# Patient Record
Sex: Female | Born: 1955
Health system: Southern US, Community
[De-identification: ages and names within clinical notes are randomized; demographics above are authoritative.]

## PROBLEM LIST (undated history)

## (undated) ENCOUNTER — Emergency Department (HOSPITAL_COMMUNITY): Payer: Medicare Other | Source: Home / Self Care

## (undated) DIAGNOSIS — I10 Essential (primary) hypertension: Secondary | ICD-10-CM

## (undated) DIAGNOSIS — N186 End stage renal disease: Secondary | ICD-10-CM

## (undated) DIAGNOSIS — N2581 Secondary hyperparathyroidism of renal origin: Secondary | ICD-10-CM

## (undated) DIAGNOSIS — T8859XA Other complications of anesthesia, initial encounter: Secondary | ICD-10-CM

## (undated) DIAGNOSIS — J189 Pneumonia, unspecified organism: Secondary | ICD-10-CM

## (undated) DIAGNOSIS — J42 Unspecified chronic bronchitis: Secondary | ICD-10-CM

## (undated) DIAGNOSIS — Z992 Dependence on renal dialysis: Secondary | ICD-10-CM

## (undated) DIAGNOSIS — J302 Other seasonal allergic rhinitis: Secondary | ICD-10-CM

## (undated) DIAGNOSIS — E079 Disorder of thyroid, unspecified: Secondary | ICD-10-CM

## (undated) DIAGNOSIS — M1990 Unspecified osteoarthritis, unspecified site: Secondary | ICD-10-CM

## (undated) DIAGNOSIS — J986 Disorders of diaphragm: Secondary | ICD-10-CM

## (undated) DIAGNOSIS — T4145XA Adverse effect of unspecified anesthetic, initial encounter: Secondary | ICD-10-CM

## (undated) DIAGNOSIS — J45909 Unspecified asthma, uncomplicated: Secondary | ICD-10-CM

## (undated) DIAGNOSIS — K219 Gastro-esophageal reflux disease without esophagitis: Secondary | ICD-10-CM

## (undated) DIAGNOSIS — Z973 Presence of spectacles and contact lenses: Secondary | ICD-10-CM

## (undated) DIAGNOSIS — Z8709 Personal history of other diseases of the respiratory system: Secondary | ICD-10-CM

## (undated) DIAGNOSIS — S82002A Unspecified fracture of left patella, initial encounter for closed fracture: Secondary | ICD-10-CM

## (undated) HISTORY — PX: CATARACT EXTRACTION W/ INTRAOCULAR LENS IMPLANT: SHX1309

## (undated) HISTORY — PX: FRACTURE SURGERY: SHX138

## (undated) HISTORY — PX: TOOTH EXTRACTION: SUR596

## (undated) HISTORY — DX: Essential (primary) hypertension: I10

## (undated) HISTORY — PX: ANKLE FRACTURE SURGERY: SHX122

---
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
## 1990-09-20 HISTORY — PX: DIALYSIS FISTULA CREATION: SHX611

## 1992-09-20 DIAGNOSIS — N186 End stage renal disease: Secondary | ICD-10-CM | POA: Insufficient documentation

## 1998-01-08 ENCOUNTER — Other Ambulatory Visit: Admission: RE | Admit: 1998-01-08 | Discharge: 1998-01-08 | Payer: Self-pay | Admitting: *Deleted

## 1998-01-28 ENCOUNTER — Ambulatory Visit (HOSPITAL_COMMUNITY): Admission: RE | Admit: 1998-01-28 | Discharge: 1998-01-28 | Payer: Self-pay | Admitting: *Deleted

## 1998-02-05 ENCOUNTER — Other Ambulatory Visit: Admission: RE | Admit: 1998-02-05 | Discharge: 1998-02-05 | Payer: Self-pay | Admitting: *Deleted

## 1998-03-07 ENCOUNTER — Other Ambulatory Visit: Admission: RE | Admit: 1998-03-07 | Discharge: 1998-03-07 | Payer: Self-pay | Admitting: *Deleted

## 1998-03-14 ENCOUNTER — Other Ambulatory Visit: Admission: RE | Admit: 1998-03-14 | Discharge: 1998-03-14 | Payer: Self-pay | Admitting: *Deleted

## 1998-03-15 ENCOUNTER — Other Ambulatory Visit: Admission: RE | Admit: 1998-03-15 | Discharge: 1998-03-15 | Payer: Self-pay | Admitting: *Deleted

## 1998-03-17 ENCOUNTER — Other Ambulatory Visit: Admission: RE | Admit: 1998-03-17 | Discharge: 1998-03-17 | Payer: Self-pay | Admitting: *Deleted

## 1998-03-22 ENCOUNTER — Other Ambulatory Visit: Admission: RE | Admit: 1998-03-22 | Discharge: 1998-03-22 | Payer: Self-pay | Admitting: *Deleted

## 1998-03-28 ENCOUNTER — Other Ambulatory Visit: Admission: RE | Admit: 1998-03-28 | Discharge: 1998-03-28 | Payer: Self-pay | Admitting: *Deleted

## 1998-04-05 ENCOUNTER — Other Ambulatory Visit: Admission: RE | Admit: 1998-04-05 | Discharge: 1998-04-05 | Payer: Self-pay | Admitting: *Deleted

## 1998-04-07 ENCOUNTER — Other Ambulatory Visit: Admission: RE | Admit: 1998-04-07 | Discharge: 1998-04-07 | Payer: Self-pay | Admitting: *Deleted

## 1998-04-09 ENCOUNTER — Other Ambulatory Visit: Admission: RE | Admit: 1998-04-09 | Discharge: 1998-04-09 | Payer: Self-pay | Admitting: *Deleted

## 1998-04-11 ENCOUNTER — Other Ambulatory Visit: Admission: RE | Admit: 1998-04-11 | Discharge: 1998-04-11 | Payer: Self-pay | Admitting: *Deleted

## 1998-12-09 ENCOUNTER — Encounter: Payer: Self-pay | Admitting: Vascular Surgery

## 1998-12-09 ENCOUNTER — Ambulatory Visit: Admission: RE | Admit: 1998-12-09 | Discharge: 1998-12-09 | Payer: Self-pay | Admitting: Vascular Surgery

## 1999-01-22 ENCOUNTER — Ambulatory Visit (HOSPITAL_COMMUNITY): Admission: RE | Admit: 1999-01-22 | Discharge: 1999-01-22 | Payer: Self-pay | Admitting: Vascular Surgery

## 1999-06-30 ENCOUNTER — Encounter: Admission: RE | Admit: 1999-06-30 | Discharge: 1999-06-30 | Payer: Self-pay | Admitting: *Deleted

## 1999-07-08 ENCOUNTER — Emergency Department (HOSPITAL_COMMUNITY): Admission: EM | Admit: 1999-07-08 | Discharge: 1999-07-08 | Payer: Self-pay | Admitting: Emergency Medicine

## 1999-08-25 ENCOUNTER — Encounter: Payer: Self-pay | Admitting: Nephrology

## 1999-08-25 ENCOUNTER — Encounter: Admission: RE | Admit: 1999-08-25 | Discharge: 1999-08-25 | Payer: Self-pay | Admitting: Nephrology

## 1999-11-30 ENCOUNTER — Ambulatory Visit (HOSPITAL_COMMUNITY): Admission: RE | Admit: 1999-11-30 | Discharge: 1999-11-30 | Payer: Self-pay | Admitting: Vascular Surgery

## 1999-12-24 ENCOUNTER — Ambulatory Visit (HOSPITAL_COMMUNITY): Admission: RE | Admit: 1999-12-24 | Discharge: 1999-12-24 | Payer: Self-pay | Admitting: Nephrology

## 2000-01-26 ENCOUNTER — Emergency Department (HOSPITAL_COMMUNITY): Admission: EM | Admit: 2000-01-26 | Discharge: 2000-01-26 | Payer: Self-pay | Admitting: Emergency Medicine

## 2001-04-10 ENCOUNTER — Emergency Department (HOSPITAL_COMMUNITY): Admission: EM | Admit: 2001-04-10 | Discharge: 2001-04-10 | Payer: Self-pay | Admitting: *Deleted

## 2001-04-25 ENCOUNTER — Ambulatory Visit (HOSPITAL_COMMUNITY): Admission: RE | Admit: 2001-04-25 | Discharge: 2001-04-25 | Payer: Self-pay | Admitting: Surgery

## 2001-04-25 ENCOUNTER — Encounter: Payer: Self-pay | Admitting: Surgery

## 2001-04-27 ENCOUNTER — Other Ambulatory Visit: Admission: RE | Admit: 2001-04-27 | Discharge: 2001-04-27 | Payer: Self-pay | Admitting: Family Medicine

## 2001-06-28 DIAGNOSIS — D509 Iron deficiency anemia, unspecified: Secondary | ICD-10-CM | POA: Insufficient documentation

## 2001-11-20 ENCOUNTER — Encounter: Admission: RE | Admit: 2001-11-20 | Discharge: 2001-11-20 | Payer: Self-pay | Admitting: Nephrology

## 2001-11-20 ENCOUNTER — Encounter: Payer: Self-pay | Admitting: Nephrology

## 2002-04-02 ENCOUNTER — Encounter: Payer: Self-pay | Admitting: Vascular Surgery

## 2002-04-02 ENCOUNTER — Ambulatory Visit (HOSPITAL_COMMUNITY): Admission: RE | Admit: 2002-04-02 | Discharge: 2002-04-02 | Payer: Self-pay | Admitting: Nephrology

## 2002-04-03 ENCOUNTER — Ambulatory Visit (HOSPITAL_COMMUNITY): Admission: RE | Admit: 2002-04-03 | Discharge: 2002-04-03 | Payer: Self-pay | Admitting: Nephrology

## 2002-04-10 ENCOUNTER — Inpatient Hospital Stay (HOSPITAL_COMMUNITY): Admission: RE | Admit: 2002-04-10 | Discharge: 2002-04-10 | Payer: Self-pay | Admitting: *Deleted

## 2002-04-10 ENCOUNTER — Encounter: Payer: Self-pay | Admitting: *Deleted

## 2002-05-03 ENCOUNTER — Ambulatory Visit (HOSPITAL_COMMUNITY): Admission: RE | Admit: 2002-05-03 | Discharge: 2002-05-03 | Payer: Self-pay | Admitting: Nephrology

## 2002-09-10 ENCOUNTER — Encounter: Payer: Self-pay | Admitting: Emergency Medicine

## 2002-09-10 ENCOUNTER — Encounter: Payer: Self-pay | Admitting: Nephrology

## 2002-09-10 ENCOUNTER — Inpatient Hospital Stay (HOSPITAL_COMMUNITY): Admission: EM | Admit: 2002-09-10 | Discharge: 2002-09-11 | Payer: Self-pay | Admitting: Emergency Medicine

## 2002-10-19 ENCOUNTER — Encounter: Admission: RE | Admit: 2002-10-19 | Discharge: 2002-10-19 | Payer: Self-pay | Admitting: Nephrology

## 2002-10-19 ENCOUNTER — Encounter: Payer: Self-pay | Admitting: Nephrology

## 2002-10-20 ENCOUNTER — Encounter: Payer: Self-pay | Admitting: Emergency Medicine

## 2002-10-20 ENCOUNTER — Emergency Department (HOSPITAL_COMMUNITY): Admission: EM | Admit: 2002-10-20 | Discharge: 2002-10-20 | Payer: Self-pay | Admitting: Emergency Medicine

## 2002-12-03 ENCOUNTER — Encounter: Admission: RE | Admit: 2002-12-03 | Discharge: 2002-12-03 | Payer: Self-pay | Admitting: Nephrology

## 2002-12-03 ENCOUNTER — Encounter: Payer: Self-pay | Admitting: Nephrology

## 2003-03-29 ENCOUNTER — Ambulatory Visit (HOSPITAL_COMMUNITY): Admission: RE | Admit: 2003-03-29 | Discharge: 2003-03-29 | Payer: Self-pay | Admitting: Vascular Surgery

## 2003-06-21 DIAGNOSIS — N2581 Secondary hyperparathyroidism of renal origin: Secondary | ICD-10-CM | POA: Insufficient documentation

## 2003-09-28 ENCOUNTER — Emergency Department (HOSPITAL_COMMUNITY): Admission: EM | Admit: 2003-09-28 | Discharge: 2003-09-28 | Payer: Self-pay | Admitting: Emergency Medicine

## 2003-10-11 ENCOUNTER — Ambulatory Visit (HOSPITAL_COMMUNITY): Admission: RE | Admit: 2003-10-11 | Discharge: 2003-10-11 | Payer: Self-pay | Admitting: Family Medicine

## 2003-10-15 ENCOUNTER — Encounter: Admission: RE | Admit: 2003-10-15 | Discharge: 2003-10-15 | Payer: Self-pay | Admitting: *Deleted

## 2003-10-17 ENCOUNTER — Encounter: Admission: RE | Admit: 2003-10-17 | Discharge: 2003-10-17 | Payer: Self-pay | Admitting: Obstetrics and Gynecology

## 2003-12-06 ENCOUNTER — Ambulatory Visit (HOSPITAL_COMMUNITY): Admission: RE | Admit: 2003-12-06 | Discharge: 2003-12-06 | Payer: Self-pay | Admitting: Vascular Surgery

## 2004-02-26 ENCOUNTER — Ambulatory Visit (HOSPITAL_COMMUNITY): Admission: RE | Admit: 2004-02-26 | Discharge: 2004-02-27 | Payer: Self-pay | Admitting: Nephrology

## 2004-02-27 ENCOUNTER — Ambulatory Visit (HOSPITAL_COMMUNITY): Admission: RE | Admit: 2004-02-27 | Discharge: 2004-02-27 | Payer: Self-pay | Admitting: *Deleted

## 2004-03-06 ENCOUNTER — Emergency Department (HOSPITAL_COMMUNITY): Admission: EM | Admit: 2004-03-06 | Discharge: 2004-03-06 | Payer: Self-pay | Admitting: Emergency Medicine

## 2004-04-02 ENCOUNTER — Ambulatory Visit (HOSPITAL_COMMUNITY): Admission: RE | Admit: 2004-04-02 | Discharge: 2004-04-02 | Payer: Self-pay | Admitting: Vascular Surgery

## 2004-04-16 ENCOUNTER — Encounter (HOSPITAL_COMMUNITY): Admission: RE | Admit: 2004-04-16 | Discharge: 2004-04-27 | Payer: Self-pay | Admitting: Vascular Surgery

## 2004-07-20 ENCOUNTER — Inpatient Hospital Stay (HOSPITAL_COMMUNITY): Admission: AD | Admit: 2004-07-20 | Discharge: 2004-07-20 | Payer: Self-pay | Admitting: Obstetrics & Gynecology

## 2004-08-03 ENCOUNTER — Ambulatory Visit (HOSPITAL_COMMUNITY): Admission: RE | Admit: 2004-08-03 | Discharge: 2004-08-03 | Payer: Self-pay | Admitting: Obstetrics

## 2004-10-12 ENCOUNTER — Ambulatory Visit (HOSPITAL_COMMUNITY): Admission: RE | Admit: 2004-10-12 | Discharge: 2004-10-12 | Payer: Self-pay | Admitting: *Deleted

## 2004-11-23 ENCOUNTER — Ambulatory Visit (HOSPITAL_COMMUNITY): Admission: RE | Admit: 2004-11-23 | Discharge: 2004-11-23 | Payer: Self-pay | Admitting: Obstetrics

## 2004-11-27 ENCOUNTER — Emergency Department (HOSPITAL_COMMUNITY): Admission: EM | Admit: 2004-11-27 | Discharge: 2004-11-27 | Payer: Self-pay | Admitting: Emergency Medicine

## 2004-11-30 ENCOUNTER — Ambulatory Visit (HOSPITAL_COMMUNITY): Admission: RE | Admit: 2004-11-30 | Discharge: 2004-11-30 | Payer: Self-pay | Admitting: Vascular Surgery

## 2005-01-29 ENCOUNTER — Ambulatory Visit (HOSPITAL_COMMUNITY): Admission: RE | Admit: 2005-01-29 | Discharge: 2005-01-29 | Payer: Self-pay | Admitting: Nephrology

## 2005-03-01 ENCOUNTER — Ambulatory Visit (HOSPITAL_COMMUNITY): Admission: RE | Admit: 2005-03-01 | Discharge: 2005-03-01 | Payer: Self-pay | Admitting: Vascular Surgery

## 2005-03-05 ENCOUNTER — Encounter (INDEPENDENT_AMBULATORY_CARE_PROVIDER_SITE_OTHER): Payer: Self-pay | Admitting: *Deleted

## 2005-03-05 ENCOUNTER — Inpatient Hospital Stay (HOSPITAL_COMMUNITY): Admission: AD | Admit: 2005-03-05 | Discharge: 2005-03-07 | Payer: Self-pay | Admitting: Obstetrics and Gynecology

## 2005-03-05 HISTORY — PX: TOTAL ABDOMINAL HYSTERECTOMY: SHX209

## 2005-03-25 ENCOUNTER — Ambulatory Visit (HOSPITAL_COMMUNITY): Admission: RE | Admit: 2005-03-25 | Discharge: 2005-03-25 | Payer: Self-pay | Admitting: Vascular Surgery

## 2005-03-25 HISTORY — PX: ARTERIOVENOUS GRAFT PLACEMENT: SUR1029

## 2005-05-04 ENCOUNTER — Ambulatory Visit (HOSPITAL_COMMUNITY): Admission: RE | Admit: 2005-05-04 | Discharge: 2005-05-04 | Payer: Self-pay | Admitting: Vascular Surgery

## 2005-12-06 ENCOUNTER — Ambulatory Visit: Payer: Self-pay | Admitting: Gastroenterology

## 2006-01-04 ENCOUNTER — Ambulatory Visit: Payer: Self-pay | Admitting: Gastroenterology

## 2006-01-11 ENCOUNTER — Ambulatory Visit: Payer: Self-pay | Admitting: Gastroenterology

## 2006-01-24 ENCOUNTER — Ambulatory Visit (HOSPITAL_COMMUNITY): Admission: RE | Admit: 2006-01-24 | Discharge: 2006-01-24 | Payer: Self-pay | Admitting: Nephrology

## 2006-02-17 ENCOUNTER — Ambulatory Visit: Payer: Self-pay | Admitting: Cardiology

## 2006-02-17 ENCOUNTER — Emergency Department (HOSPITAL_COMMUNITY): Admission: EM | Admit: 2006-02-17 | Discharge: 2006-02-17 | Payer: Self-pay | Admitting: Emergency Medicine

## 2006-02-23 ENCOUNTER — Ambulatory Visit: Payer: Self-pay

## 2006-03-11 ENCOUNTER — Encounter: Payer: Self-pay | Admitting: Cardiology

## 2006-03-11 ENCOUNTER — Ambulatory Visit: Payer: Self-pay

## 2006-04-01 ENCOUNTER — Ambulatory Visit (HOSPITAL_COMMUNITY): Admission: RE | Admit: 2006-04-01 | Discharge: 2006-04-01 | Payer: Self-pay | Admitting: Vascular Surgery

## 2006-06-05 ENCOUNTER — Emergency Department (HOSPITAL_COMMUNITY): Admission: EM | Admit: 2006-06-05 | Discharge: 2006-06-05 | Payer: Self-pay | Admitting: Emergency Medicine

## 2006-08-26 ENCOUNTER — Ambulatory Visit (HOSPITAL_COMMUNITY): Admission: RE | Admit: 2006-08-26 | Discharge: 2006-08-26 | Payer: Self-pay | Admitting: Nephrology

## 2006-09-12 ENCOUNTER — Emergency Department (HOSPITAL_COMMUNITY): Admission: EM | Admit: 2006-09-12 | Discharge: 2006-09-12 | Payer: Self-pay | Admitting: Emergency Medicine

## 2006-09-13 ENCOUNTER — Emergency Department (HOSPITAL_COMMUNITY): Admission: EM | Admit: 2006-09-13 | Discharge: 2006-09-13 | Payer: Self-pay | Admitting: Emergency Medicine

## 2006-12-04 ENCOUNTER — Emergency Department (HOSPITAL_COMMUNITY): Admission: EM | Admit: 2006-12-04 | Discharge: 2006-12-04 | Payer: Self-pay | Admitting: Emergency Medicine

## 2007-02-08 ENCOUNTER — Emergency Department (HOSPITAL_COMMUNITY): Admission: EM | Admit: 2007-02-08 | Discharge: 2007-02-09 | Payer: Self-pay | Admitting: Emergency Medicine

## 2007-10-24 ENCOUNTER — Emergency Department (HOSPITAL_COMMUNITY): Admission: EM | Admit: 2007-10-24 | Discharge: 2007-10-24 | Payer: Self-pay | Admitting: Emergency Medicine

## 2007-10-26 ENCOUNTER — Emergency Department (HOSPITAL_COMMUNITY): Admission: EM | Admit: 2007-10-26 | Discharge: 2007-10-26 | Payer: Self-pay | Admitting: Emergency Medicine

## 2007-12-14 ENCOUNTER — Ambulatory Visit (HOSPITAL_COMMUNITY): Admission: RE | Admit: 2007-12-14 | Discharge: 2007-12-14 | Payer: Self-pay | Admitting: Nephrology

## 2008-06-12 ENCOUNTER — Ambulatory Visit (HOSPITAL_COMMUNITY): Admission: RE | Admit: 2008-06-12 | Discharge: 2008-06-12 | Payer: Self-pay | Admitting: Nephrology

## 2008-07-31 ENCOUNTER — Inpatient Hospital Stay (HOSPITAL_COMMUNITY): Admission: EM | Admit: 2008-07-31 | Discharge: 2008-08-06 | Payer: Self-pay | Admitting: Emergency Medicine

## 2008-08-08 ENCOUNTER — Ambulatory Visit (HOSPITAL_COMMUNITY): Admission: RE | Admit: 2008-08-08 | Discharge: 2008-08-08 | Payer: Self-pay | Admitting: Vascular Surgery

## 2008-08-08 ENCOUNTER — Ambulatory Visit: Payer: Self-pay | Admitting: Vascular Surgery

## 2008-09-08 ENCOUNTER — Emergency Department (HOSPITAL_COMMUNITY): Admission: EM | Admit: 2008-09-08 | Discharge: 2008-09-08 | Payer: Self-pay | Admitting: Emergency Medicine

## 2009-01-09 ENCOUNTER — Ambulatory Visit (HOSPITAL_COMMUNITY): Admission: RE | Admit: 2009-01-09 | Discharge: 2009-01-09 | Payer: Self-pay | Admitting: Nephrology

## 2009-08-14 ENCOUNTER — Emergency Department (HOSPITAL_COMMUNITY): Admission: EM | Admit: 2009-08-14 | Discharge: 2009-08-15 | Payer: Self-pay | Admitting: Emergency Medicine

## 2009-11-12 ENCOUNTER — Encounter: Admission: RE | Admit: 2009-11-12 | Discharge: 2009-11-12 | Payer: Self-pay | Admitting: Nephrology

## 2010-02-19 ENCOUNTER — Ambulatory Visit: Payer: Self-pay | Admitting: Vascular Surgery

## 2010-02-24 ENCOUNTER — Ambulatory Visit (HOSPITAL_COMMUNITY): Admission: RE | Admit: 2010-02-24 | Discharge: 2010-02-24 | Payer: Self-pay | Admitting: Vascular Surgery

## 2010-02-24 ENCOUNTER — Ambulatory Visit: Payer: Self-pay | Admitting: Vascular Surgery

## 2010-03-11 ENCOUNTER — Ambulatory Visit: Payer: Self-pay | Admitting: Vascular Surgery

## 2010-05-18 ENCOUNTER — Emergency Department (HOSPITAL_COMMUNITY): Admission: EM | Admit: 2010-05-18 | Discharge: 2010-05-18 | Payer: Self-pay | Admitting: Emergency Medicine

## 2010-09-18 ENCOUNTER — Emergency Department (HOSPITAL_COMMUNITY)
Admission: EM | Admit: 2010-09-18 | Discharge: 2010-09-18 | Payer: Self-pay | Source: Home / Self Care | Admitting: Family Medicine

## 2010-09-23 ENCOUNTER — Ambulatory Visit
Admission: RE | Admit: 2010-09-23 | Discharge: 2010-09-23 | Payer: Self-pay | Source: Home / Self Care | Attending: Vascular Surgery | Admitting: Vascular Surgery

## 2010-09-24 ENCOUNTER — Ambulatory Visit (HOSPITAL_COMMUNITY)
Admission: RE | Admit: 2010-09-24 | Discharge: 2010-09-24 | Payer: Self-pay | Source: Home / Self Care | Attending: Vascular Surgery | Admitting: Vascular Surgery

## 2010-09-24 LAB — POCT I-STAT 4, (NA,K, GLUC, HGB,HCT)
Glucose, Bld: 92 mg/dL (ref 70–99)
HCT: 44 % (ref 36.0–46.0)
Hemoglobin: 15 g/dL (ref 12.0–15.0)
Potassium: 4 mEq/L (ref 3.5–5.1)
Sodium: 142 mEq/L (ref 135–145)

## 2010-10-10 ENCOUNTER — Encounter: Payer: Self-pay | Admitting: Obstetrics

## 2010-10-11 ENCOUNTER — Encounter: Payer: Self-pay | Admitting: Nephrology

## 2010-11-21 ENCOUNTER — Emergency Department (HOSPITAL_COMMUNITY): Payer: Medicare Other

## 2010-11-21 ENCOUNTER — Emergency Department (HOSPITAL_COMMUNITY)
Admission: EM | Admit: 2010-11-21 | Discharge: 2010-11-21 | Disposition: A | Discharge status: Home / Self Care | Payer: Medicare Other | Attending: Emergency Medicine | Admitting: Emergency Medicine

## 2010-11-21 DIAGNOSIS — R059 Cough, unspecified: Secondary | ICD-10-CM | POA: Insufficient documentation

## 2010-11-21 DIAGNOSIS — R05 Cough: Secondary | ICD-10-CM | POA: Insufficient documentation

## 2010-11-21 DIAGNOSIS — J40 Bronchitis, not specified as acute or chronic: Secondary | ICD-10-CM | POA: Insufficient documentation

## 2010-11-21 DIAGNOSIS — I10 Essential (primary) hypertension: Secondary | ICD-10-CM | POA: Insufficient documentation

## 2010-11-21 DIAGNOSIS — N289 Disorder of kidney and ureter, unspecified: Secondary | ICD-10-CM | POA: Insufficient documentation

## 2010-11-21 DIAGNOSIS — R112 Nausea with vomiting, unspecified: Secondary | ICD-10-CM | POA: Insufficient documentation

## 2010-11-21 DIAGNOSIS — R071 Chest pain on breathing: Secondary | ICD-10-CM | POA: Insufficient documentation

## 2010-11-21 LAB — COMPREHENSIVE METABOLIC PANEL
ALT: 13 U/L (ref 0–35)
AST: 20 U/L (ref 0–37)
Albumin: 3.4 g/dL — ABNORMAL LOW (ref 3.5–5.2)
CO2: 29 mEq/L (ref 19–32)
Calcium: 9.7 mg/dL (ref 8.4–10.5)
GFR calc Af Amer: 5 mL/min — ABNORMAL LOW (ref >60)
GFR calc non Af Amer: 4 mL/min — ABNORMAL LOW (ref >60)
Sodium: 142 mEq/L (ref 135–145)

## 2010-11-21 LAB — DIFFERENTIAL
Basophils Absolute: 0.1 10*3/uL (ref 0.0–0.1)
Basophils Relative: 1 % (ref 0–1)
Eosinophils Absolute: 0.7 10*3/uL (ref 0.0–0.7)
Monocytes Absolute: 0.8 10*3/uL (ref 0.1–1.0)
Neutro Abs: 5.6 10*3/uL (ref 1.7–7.7)
Neutrophils Relative %: 56 % (ref 43–77)

## 2010-11-21 LAB — CBC
Hemoglobin: 13 g/dL (ref 12.0–15.0)
MCHC: 32.6 g/dL (ref 30.0–36.0)
Platelets: 261 10*3/uL (ref 150–400)
RBC: 4.9 MIL/uL (ref 3.87–5.11)

## 2010-11-21 LAB — POCT CARDIAC MARKERS
CKMB, poc: <1 ng/mL — ABNORMAL LOW (ref 1.0–8.0)
Myoglobin, poc: >500 ng/mL (ref 12–200)

## 2010-12-04 LAB — POCT I-STAT, CHEM 8
BUN: 33 mg/dL — ABNORMAL HIGH (ref 6–23)
Calcium, Ion: 0.99 mmol/L — ABNORMAL LOW (ref 1.12–1.32)
Chloride: 101 meq/L (ref 96–112)
Creatinine, Ser: 9.7 mg/dL — ABNORMAL HIGH (ref 0.4–1.2)
Glucose, Bld: 106 mg/dL — ABNORMAL HIGH (ref 70–99)
HCT: 43 % (ref 36.0–46.0)
Hemoglobin: 14.6 g/dL (ref 12.0–15.0)
Potassium: 4.6 meq/L (ref 3.5–5.1)
Sodium: 139 meq/L (ref 135–145)
TCO2: 31 mmol/L (ref 0–100)

## 2010-12-04 LAB — CBC
HCT: 40.2 % (ref 36.0–46.0)
Hemoglobin: 13.3 g/dL (ref 12.0–15.0)
MCH: 26.5 pg (ref 26.0–34.0)
MCHC: 33.1 g/dL (ref 30.0–36.0)
MCV: 80.2 fL (ref 78.0–100.0)
RBC: 5.01 MIL/uL (ref 3.87–5.11)

## 2010-12-04 LAB — DIFFERENTIAL
Basophils Relative: 1 % (ref 0–1)
Eosinophils Absolute: 0.3 10*3/uL (ref 0.0–0.7)
Eosinophils Relative: 3 % (ref 0–5)
Lymphs Abs: 2 10*3/uL (ref 0.7–4.0)
Monocytes Absolute: 0.7 10*3/uL (ref 0.1–1.0)
Monocytes Relative: 7 % (ref 3–12)
Neutrophils Relative %: 69 % (ref 43–77)

## 2010-12-04 LAB — POCT CARDIAC MARKERS
CKMB, poc: 1.2 ng/mL (ref 1.0–8.0)
Myoglobin, poc: >500 ng/mL (ref 12–200)
Troponin i, poc: <0.05 ng/mL (ref 0.00–0.09)

## 2010-12-07 LAB — POCT I-STAT 4, (NA,K, GLUC, HGB,HCT)
Glucose, Bld: 94 mg/dL (ref 70–99)
HCT: 42 % (ref 36.0–46.0)
Potassium: 3.8 mEq/L (ref 3.5–5.1)

## 2011-01-11 ENCOUNTER — Other Ambulatory Visit: Payer: Self-pay | Admitting: Surgery

## 2011-01-11 DIAGNOSIS — E041 Nontoxic single thyroid nodule: Secondary | ICD-10-CM

## 2011-01-13 ENCOUNTER — Ambulatory Visit
Admission: RE | Admit: 2011-01-13 | Discharge: 2011-01-13 | Disposition: A | Discharge status: Home / Self Care | Payer: Medicare Other | Source: Ambulatory Visit | Attending: Surgery | Admitting: Surgery

## 2011-01-13 DIAGNOSIS — E041 Nontoxic single thyroid nodule: Secondary | ICD-10-CM

## 2011-01-26 ENCOUNTER — Inpatient Hospital Stay (HOSPITAL_COMMUNITY)
Admission: RE | Admit: 2011-01-26 | Discharge: 2011-01-27 | DRG: 264 | Disposition: A | Discharge status: Home / Self Care | Payer: Medicare Other | Source: Ambulatory Visit | Attending: Surgery | Admitting: Surgery

## 2011-01-26 DIAGNOSIS — R0989 Other specified symptoms and signs involving the circulatory and respiratory systems: Secondary | ICD-10-CM | POA: Diagnosis present

## 2011-01-26 DIAGNOSIS — N186 End stage renal disease: Secondary | ICD-10-CM

## 2011-01-26 DIAGNOSIS — Z5309 Procedure and treatment not carried out because of other contraindication: Secondary | ICD-10-CM

## 2011-01-26 DIAGNOSIS — I120 Hypertensive chronic kidney disease with stage 5 chronic kidney disease or end stage renal disease: Secondary | ICD-10-CM

## 2011-01-26 DIAGNOSIS — T82898A Other specified complication of vascular prosthetic devices, implants and grafts, initial encounter: Principal | ICD-10-CM | POA: Diagnosis present

## 2011-01-26 DIAGNOSIS — Y832 Surgical operation with anastomosis, bypass or graft as the cause of abnormal reaction of the patient, or of later complication, without mention of misadventure at the time of the procedure: Secondary | ICD-10-CM | POA: Diagnosis present

## 2011-01-26 DIAGNOSIS — N2581 Secondary hyperparathyroidism of renal origin: Secondary | ICD-10-CM | POA: Diagnosis present

## 2011-01-26 DIAGNOSIS — R0609 Other forms of dyspnea: Secondary | ICD-10-CM | POA: Diagnosis present

## 2011-01-26 DIAGNOSIS — Z992 Dependence on renal dialysis: Secondary | ICD-10-CM

## 2011-01-26 DIAGNOSIS — Y9210 Unspecified residential institution as the place of occurrence of the external cause: Secondary | ICD-10-CM | POA: Diagnosis present

## 2011-01-26 DIAGNOSIS — Y92009 Unspecified place in unspecified non-institutional (private) residence as the place of occurrence of the external cause: Secondary | ICD-10-CM

## 2011-01-26 DIAGNOSIS — T361X5A Adverse effect of cephalosporins and other beta-lactam antibiotics, initial encounter: Secondary | ICD-10-CM | POA: Diagnosis present

## 2011-01-26 LAB — BASIC METABOLIC PANEL
BUN: 24 mg/dL — ABNORMAL HIGH (ref 6–23)
Calcium: 9.1 mg/dL (ref 8.4–10.5)
GFR calc non Af Amer: 4 mL/min — ABNORMAL LOW (ref >60)
Glucose, Bld: 79 mg/dL (ref 70–99)
Potassium: 4.1 mEq/L (ref 3.5–5.1)

## 2011-01-26 LAB — DIFFERENTIAL
Basophils Absolute: 0.1 10*3/uL (ref 0.0–0.1)
Basophils Relative: 2 % — ABNORMAL HIGH (ref 0–1)
Eosinophils Absolute: 0.5 10*3/uL (ref 0.0–0.7)
Monocytes Absolute: 0.4 10*3/uL (ref 0.1–1.0)
Monocytes Relative: 4 % (ref 3–12)
Neutro Abs: 4.9 10*3/uL (ref 1.7–7.7)
Neutrophils Relative %: 59 % (ref 43–77)

## 2011-01-26 LAB — CBC
Hemoglobin: 13.8 g/dL (ref 12.0–15.0)
MCH: 26.8 pg (ref 26.0–34.0)
MCHC: 33.1 g/dL (ref 30.0–36.0)
Platelets: 222 10*3/uL (ref 150–400)
RBC: 5.14 MIL/uL — ABNORMAL HIGH (ref 3.87–5.11)

## 2011-01-26 LAB — PROTIME-INR
INR: 0.93 (ref 0.00–1.49)
Prothrombin Time: 12.7 seconds (ref 11.6–15.2)

## 2011-01-26 LAB — SURGICAL PCR SCREEN: Staphylococcus aureus: NEGATIVE

## 2011-01-27 ENCOUNTER — Inpatient Hospital Stay (HOSPITAL_COMMUNITY): Payer: Medicare Other

## 2011-01-27 ENCOUNTER — Other Ambulatory Visit: Payer: Self-pay | Admitting: Surgery

## 2011-01-27 DIAGNOSIS — E041 Nontoxic single thyroid nodule: Secondary | ICD-10-CM

## 2011-01-27 LAB — BASIC METABOLIC PANEL
BUN: 40 mg/dL — ABNORMAL HIGH (ref 6–23)
CO2: 26 mEq/L (ref 19–32)
Calcium: 9.4 mg/dL (ref 8.4–10.5)
Creatinine, Ser: 12.47 mg/dL — ABNORMAL HIGH (ref 0.4–1.2)
GFR calc non Af Amer: 3 mL/min — ABNORMAL LOW (ref >60)
Glucose, Bld: 115 mg/dL — ABNORMAL HIGH (ref 70–99)

## 2011-01-27 NOTE — Op Note (Signed)
Victoria [name], NIGRO                ACCOUNT NO.:  [id number]  MEDICAL RECORD NO.:  [id number]           PATIENT TYPE:  I  LOCATION:  6713                         FACILITY:  [facility]  PHYSICIAN:  Judeth Cornfield. Scot Dock, M.D.DATE OF BIRTH:  May 11, 1956  DATE OF PROCEDURE:  01/26/2011 DATE OF DISCHARGE:                              OPERATIVE REPORT   PREOPERATIVE DIAGNOSIS:  Clotted right forearm arteriovenous graft.  POSTOPERATIVE DIAGNOSIS:  Clotted right forearm arteriovenous graft.  PROCEDURES:  Thrombectomy and revision of right forearm arteriovenous graft.  SURGEON:  Judeth Cornfield. Scot Dock, MD  ASSISTANT:  Wray Kearns, PA-C  ANESTHESIA:  Local with sedation.  INDICATIONS:  This is a 55 year old woman who was admitted for parathyroidectomy for secondary hyperparathyroidism.  She had an allergic reaction to Ancef during the induction of anesthesia and her surgery was cancelled.  In recovery room, she was noted to have a clotted right forearm AV graft.  Of note, she had been hypotensive during the allergic reaction and it was felt that possibly the graft clotted secondary to hypertension.  Of note in reviewing her note, she has had multiple revisions of this graft previously.  In June 2011, the graft was significantly degenerative and a segment of graft was replaced along the arterial aspect of the graft which was on the ulnar aspect of the forearm.  More recently, she had replacement of the venous half of the graft along the radial aspect of the forearm September 24, 2010.  She comes in, she presents for thrombectomy of her graft.  TECHNIQUE:  The patient was taken to the operating room and sedated by anesthesia.  The right upper extremity was prepped and draped in the usual sterile fashion.  The incision was made over the antecubital level, where the venous limb of the graft was dissected free.  It was divided and the patient was heparinized.  The arterial half of the  graft was along the ulnar aspect of the forearm and the venous half of the graft was along the radial aspect of the forearm.  Graft thrombectomy was achieved using a #4 Fogarty catheter.  Good flow was established to the graft.  There was marked irregularity throughout the entire graft really no single focal area that was a problem.  The graft was flushed with heparinized saline and clamp.  At the venous end, there was intimal hyperplasia and I elected to jump higher on the brachial vein.  The vein further proximally was dissected out, was somewhat thickened, but had a patent lumen and the graft.  A new segment of graft was brought to the field and sewn end-to-end to the old graft after it was divided with continuous 6-0 Prolene suture.  The new segment of grafts cut to appropriate length, spatulated and sewn end-to-end to the more proximal brachial vein using continuous 6-0 Prolene suture.  At the completion, there was a good thrill in the graft.  Hemostasis was obtained in the wound.  The wound was closed with deep layer of 3-0 Vicryl and the skin closed with 4-0 Vicryl. Sterile dressing was applied.  The patient tolerated the procedure well,  was transferred to the recovery room in stable condition.  All needle, instrument, and sponge counts were correct.     Judeth Cornfield. Scot Dock, M.D.     CSD/MEDQ  D:  01/26/2011  T:  01/27/2011  Job:  [id number]  Electronically Signed by Deitra Mayo M.D. on 01/27/2011 10:36:37 AM

## 2011-02-02 NOTE — Assessment & Plan Note (Signed)
OFFICE VISIT   Victoria [name], Victoria [name]  DOB:  01/16/1956                                       02/19/2010  [id number]   I saw the patient in the office today in consultation concerning a  nodule along the ulnar aspect of her right forearm AV graft.  She had a  right forearm graft placed approximately 2 years ago and had a revision  of this graft once in which they bypassed around an aneurysmal segment  of her graft.  She developed a nodule along this revised segment  approximately 2 weeks ago which has been not improving.  She was sent  for vascular evaluation.  She has noted some small amount of drainage  from this wound.  She states that the graft has been functioning well.  She has had no recent uremic symptoms.  Specifically she denies any  problem with fatigue, anorexia, nausea, vomiting, palpitations or  shortness of breath.   Her past medical history is significant for hypertension and end-stage  renal disease.  She denies any history of diabetes.   On social history she is married.  She has two children.  She does not  use tobacco.   REVIEW OF SYSTEMS:  CARDIOVASCULAR:  She has had no chest pain, chest  pressure, palpitations or arrhythmias.  PULMONARY:  She has had no productive cough, bronchitis, asthma or  wheezing.   PHYSICAL EXAMINATION:  General:  This is a pleasant 55 year old woman  who appears her stated age.  Vital signs:  Her blood pressure is 104/71,  heart rate is 88.  Temperature is 98.2.  Lungs:  Clear bilaterally to  auscultation.  She has a palpable thrill in her right forearm AV graft  which is slightly pulsatile.  The radial aspect of the graft is somewhat  aneurysmal.  The revised segment along the ulnar aspect of her forearm  has a small nodule present.  Currently there is no significant  surrounding erythema or drainage.  However, there is a slight eschar  over the wound.   I feel that she would be at risk for bleeding if  we did not address this  nodule and therefore I have recommended that we bypass around this area  with a new segment of graft and then remove this area.  I have explained  that there is a small chance that we will find that the entire graft is  infected in which case it would have to be removed and we would have to  place a temporary catheter.  However, I think most likely we will be  able to revise this and salvage the fistula.  She dialyzes Mondays,  Wednesdays and Fridays and her surgery has been scheduled for June 7.     Judeth Cornfield. Scot Dock, M.D.  Electronically Signed   CSD/MEDQ  D:  02/19/2010  T:  02/20/2010  Job:  [id number]   cc:   Jeneen Rinks L. Deterding, M.D.

## 2011-02-02 NOTE — Discharge Summary (Signed)
Victoria [name], Victoria [name]                 ACCOUNT NO.:  [id number]   MEDICAL RECORD NO.:  [id number]          PATIENT TYPE:  INP   LOCATION:  6704                         FACILITY:  [facility]   PHYSICIAN:  Metta Clines. Supple, M.D.  DATE OF BIRTH:  18-Nov-1955   DATE OF ADMISSION:  07/31/2008  DATE OF DISCHARGE:  08/06/2008                               DISCHARGE SUMMARY   ADMISSION DIAGNOSES:  1. Displaced bimalleolar ankle fracture on the right knee.  2. End-stage renal disease.   OPERATIONS:  Open reduction internal fixation of right ankle.  Surgeon  Dr. Onnie Graham, assistant Jenetta Loges, P.A.-C.  General anesthetic.   HISTORY:  Victoria [name] is a 55 year old female with end-stage renal disease  on her way to hemodialysis, and slipped and fell in some leaves injuring  her right ankle.  She had pain and deformity with inability to bear  weight, and brought to [facility] emergency room.  They did perform a  closed reduction, and x-rays confirmed a displaced bimalleolar ankle  fracture.  With findings it was recommended for surgical fixation.  Surgery and risks and benefits discussed at length with the patient, and  she was in agreement and wished to proceed.   HOSPITAL COURSE:  The patient is admitted and underwent above-mentioned  procedure, and tolerated this well.  All appropriate IV antibiotics were  utilized.  Her dialysis was delayed 1 day because of the above-named  surgery, but was performed on the 12th.  The patient did extremely well.  However, she was placed at nonweightbearing on that operative extremity,  and because she lives on a 2nd floor apartment due to her other  comorbidities it just was not safe for her to return back to her  previous home living situation.  Therefore, bed availability was sought  for skilled care facility.  The patient remained hemodynamically stable.  Renal service continued to follow patient for her end-stage renal  disease.  On date August 05, 2008, she was  afebrile.  Vital signs are  stable, and she was scheduled for dialysis today to get her back on her  previous Monday, Wednesday, and Friday dialysis schedule.  This is being  dictated for her discharge on tomorrow, August 06, 2008.  At this time  she was afebrile.  Her splint was clean and dry, and she was tolerating  just short distances and transfers for nonweightbearing on that side.  EKG on admission showed normal sinus rhythm.  Radiology confirmed again  displaced bimalleolar ankle fracture on the right, and c-arm was used  intraoperatively.  Multiple lab values drawn for renal dialysis.  See  chart for details.   CONDITION ON DISCHARGE:  Stable and improved.   DISCHARGE MEDICATIONS AND PLANS:  Patient being discharged to skilled  care facility.  She is to be strict nonweightbearing to the right lower  extremity.  She is on a renal diet.  Hemodialysis on Monday, Wednesday,  and Friday.  She is on the following medications:   1. Hectorol 3 mcg IV at dialysis.  2. Aranesp 6.25 mg for hemodialysis.  3.  Sensipar 30 mg daily.  4. Nephro-Vite.  5. Lovenox was used inpatient, but it will be discontinued at time of      discharge.  6. Catapres 0.1 mg q.6 p.r.n. systolic blood pressure greater than      170.  7. Percocet 1-2 every 4-6 p.r.n. pain.  8. Robaxin 500 mg 1 every 8 p.r.n. spasm.  9. Again hemodialysis Monday, Wednesday, and Friday.   Follow up in our office in 2 weeks. Have the facility arrange for  transportation and time. For her weightbearing status, STRICT  NONWEIGHTBEARING.  Ice and elevate her right extremity.   CONDITION ON DISCHARGE:  Stable and improved.      Tracy A. Shuford, P.A.-C.      Metta Clines. Supple, M.D.  Electronically Signed    TAS/MEDQ  D:  08/05/2008  T:  08/05/2008  Job:  [id number]

## 2011-02-02 NOTE — Op Note (Signed)
NAMERAGAD, ROTHROCK                 ACCOUNT NO.:  [id number]   MEDICAL RECORD NO.:  [id number]          PATIENT TYPE:  INP   LOCATION:  6704                         FACILITY:  [facility]   PHYSICIAN:  Metta Clines. Supple, M.D.  DATE OF BIRTH:  24-Oct-1955   DATE OF PROCEDURE:  07/31/2008  DATE OF DISCHARGE:                               OPERATIVE REPORT   PREOPERATIVE DIAGNOSIS:  Displaced left bimalleolar ankle fracture.   POSTOPERATIVE DIAGNOSIS:  Displaced left bimalleolar ankle fracture.   PROCEDURE:  Open reduction and internal fixation of left bimalleolar  ankle fracture.   SURGEON:  Metta Clines. Supple, MD   ASSISTANT:  Reather Laurence. Shuford, PA-C   ANESTHESIA:  General endotracheal as well as local anesthetic at the end  of the case.   TOURNIQUET TIME:  Approximately 1 hour.   ESTIMATED BLOOD LOSS:  Minimal.   DRAINS:  None.   HISTORY:  [name] is a 55 year old female with end-stage renal  disease, on hemodialysis with severe hypertension, who slipped and fell  earlier today injuring her left ankle with immediate complaints of pain  deformity and inability to bear weight. On presentation to the emergency  room, she was found to have deformity of the right ankle with  radiographs showing a fracture dislocation of the right ankle.  A closed  reduction was performed by the emergency room staff with subsequent  reduction showing the talus congruently within the tibial plafond, but  displaced medial and lateral malleoli.  She is subsequently brought to  the operating at this time for planned open reduction and internal  fixation.   Preoperatively, I counseled Ms. Brown on treatment options as well as  risks versus benefits thereof.  Possible surgical complications  including infection, neurovascular injury, DVT, PE, malunion, nonunion,  loss of fixation, and possible need for additional surgery are reviewed.  She understands and accepts and agreed with the planned procedure.   PROCEDURE IN DETAIL:  After undergoing routine preop evaluation, the  patient received prophylactic antibiotics.  Placed supine on the  operating room table, underwent smooth induction of a general  endotracheal anesthesia.  Tourniquet was applied to the right thigh and  right leg was sterilely prepped and draped in the standard fashion.  Right lower extremity was exsanguinated with the tourniquet inflated to  350 mmHg.  We made an anterior hockey stick incision of the medial  malleolus with skin flaps mobilized and dissection carried deeply to the  fracture site with subperiosteal dissection used to expose the fracture  site.  The fracture site was opened and the ankle joint space was  copiously irrigated and several cartilage fragments were evacuated.  They appeared to have originated from along the fracture site on the  articular side.  The talar dome appeared to be in good condition.  Once  we confirmed that proper alignment could be achieved medially, we  returned our attention laterally where a longitudinal 10-cm incision was  made over the distal fibula.  The skin flaps were elevated.  Dissection  was carried deeply.  The peroneal musculature was reflected posteriorly  and subperiosteal dissection was used to expose the distal fibula and  the fracture site.  Fracture site was then exposed and irrigated and  soft tissues were removed and a direct reduction was performed and a  seven hole one-third tubular locking plate was then contoured to fit  over the posterolateral margin of the distal fibula.  The reduction was  then held with a lobster claw clamp.  We then initially placed a lag  screw through the plate, which obtained excellent compression and good  alignment of the fracture site.  We then placed locking screws  proximally and distally with good bony purchase.  At this time, the  attention was then returned to the medial aspect of the ankle where we  performed a reduction  under direct visualization of the medial malleolus  and placed 2 guide pins for the 4.0 cannulated screws.  Fluoroscopic  images were then used to confirm good alignment.  We then placed two 44-  mm screws with washers over the guidewires and this obtained good bony  purchase and maintaining good alignment of the fracture site.  Final  fluoroscopic images were then obtained showing good alignment of the  fracture sites.  Wounds were then irrigated.  They were closed with 2-0  Vicryl in the subcu and intracuticular 3-0 Monocryl for the skin  laterally and 3-0 nylon for the skin medially.  Bulky dry dressing was  then wrapped about the foot and ankle.  The leg was well padded, short-  leg plaster, stirrup, splint with ankle neutral position.  The  tourniquet was then laid down.  The patient was paid awakened,  extubated, and taken to the recovery room in stable condition.      Metta Clines. Supple, M.D.  Electronically Signed     KMS/MEDQ  D:  07/31/2008  T:  08/01/2008  Job:  [id number]

## 2011-02-02 NOTE — Consult Note (Signed)
Victoria [name], Victoria [name]                 ACCOUNT NO.:  [id number]   MEDICAL RECORD NO.:  [id number]          PATIENT TYPE:  INP   LOCATION:  6704                         FACILITY:  [facility]   PHYSICIAN:  Jessy Oto. Fields, MD  DATE OF BIRTH:  Jan 24, 1956   DATE OF CONSULTATION:  DATE OF DISCHARGE:                                 CONSULTATION   REASON FOR CONSULTATION:  Degenerative aneurysm, left forearm  arteriovenous graft.   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  She states that the aneurysm has slowly been getting  bigger.  She has had no previous bleeding episodes.  She has no erythema  or discharge.   She has a Monday, Wednesday, and Friday dialysis patient.  She was  recently admitted to the hospital with an ankle fracture in her right  leg.   PHYSICAL EXAMINATION:  There is a audible bruit in the graft.  She has a  3.5-cm degenerative aneurysm in the midsection of the medial limb of the  graft on her left forearm.  She has a 2.5-cm aneurysm in the lateral  limb.  There is no evidence of bleeding or thinned-out skin.   ASSESSMENT:  Aneurysm, degeneration, arteriovenous graft, left arm.  She  needs replacement of the medial limb, most likely followed by sequential  stage replacement of the lateral limb a few weeks after this has been  done.  We will schedule her for revision of the medial limb by Dr.  Kellie Simmering on Thursday, August 07, 2008.   Procedure details were discussed with the patient today.  Hopefully, we  will be able to use the lateral aspect of the graft until the medial  aspect heals.      Jessy Oto. Fields, MD  Electronically Signed     CEF/MEDQ  D:  08/05/2008  T:  08/06/2008  Job:  [id number]

## 2011-02-02 NOTE — Op Note (Signed)
NAMESAI, AUSMUS                 ACCOUNT NO.:  [id number]   MEDICAL RECORD NO.:  [id number]          PATIENT TYPE:  AMB   LOCATION:  SDS                          FACILITY:  [facility]   PHYSICIAN:  Nelda Severe. Kellie Simmering, M.D.  DATE OF BIRTH:  10-Mar-1956   DATE OF PROCEDURE:  08/08/2008  DATE OF DISCHARGE:  08/08/2008                               OPERATIVE REPORT   PREOPERATIVE DIAGNOSIS:  End-stage renal disease with arteriovenous  graft of the right forearm with 2 large aneurysms on the venous half of  the loop.   POSTOPERATIVE DIAGNOSIS:  End-stage renal disease with arteriovenous  graft of the right forearm with 2 large aneurysms on the venous half of  the loop.   OPERATIONS:  Replacement of half of the arteriovenous Gore-Tex graft  right forearm using 6 mm PTFE.   SURGEON:  Nelda Severe. Kellie Simmering, MD   FIRST ASSISTANT:  Jacinta Shoe, PA   ANESTHESIA:  Local.   COMPLICATIONS:  None.   PROCEDURE:  The patient was taken to the operating room and placed in  supine position at which time the right upper extremity was prepped with  Betadine scrub and solution and draped in the routine sterile manner.  After infiltration of 1% Xylocaine with epinephrine, a short transverse  incision was made at the apex of the loop graft at 6 o'clock position.  The large aneurysm was at the 3 o'clock position halfway half of the  loop on the ulnar side.  Second incision was made at the 12 o'clock  position.  The graft was dissected free in both areas and after  infiltration of 1% Xylocaine, a new tunnel was created peripheral to the  existing graft and a 6-mm PTFE graft was delivered through the new  tunnel.  No heparin was given.  The graft was clamped in both areas,  transected and Fogarty catheter was passed along the arterial half of  the graft as well as up the short segment of venous limb.  This was  widely patent with excellent antegrade bleeding.  Graft graft  anastomoses were done at both  places of 6-0 Prolene.  Clamps were  released.  There was excellent pulse and palpable thrill in the graft.  No protamine was given.  No heparin was given.  Wounds were irrigated  with saline and closed in layers with Vicryl in subcuticular fashion.  Sterile dressing was applied.  The patient was taken to the recovery  room in satisfactory condition.      Nelda Severe Kellie Simmering, M.D.  Electronically Signed     JDL/MEDQ  D:  08/08/2008  T:  08/08/2008  Job:  [id number]

## 2011-02-02 NOTE — Discharge Summary (Signed)
  [name], [name]                ACCOUNT NO.:  [id number]  MEDICAL RECORD NO.:  [id number]           PATIENT TYPE:  I  LOCATION:  [id number]                         FACILITY:  [facility]  PHYSICIAN:  Earnstine Regal, MD      DATE OF BIRTH:  01-25-1956  DATE OF ADMISSION:  01/26/2011 DATE OF DISCHARGE:  01/27/2011                              DISCHARGE SUMMARY   REASON FOR ADMISSION:  Secondary hyperparathyroidism, end-stage renal disease.  BRIEF HISTORY:  Victoria [name] is a 55 year old black female with end- stage renal disease and secondary hyperparathyroidism.  She was referred by Nephrology for total parathyroidectomy with autotransplantation.  She was scheduled for surgery on Jan 26, 2011.  Unfortunately at the time of induction of anesthesia, the patient had an allergic reaction to IV Ancef antibiotic.  The patient was attended by Dr. Tedra Senegal and myself.  The case was cancelled.  The patient was managed in the recovery room and admitted to the General Surgery Service at [facility]. She was seen in consultation by Nephrology.  She did have a brief episode of hypotension and unfortunately thrombosed her AV graft in the forearm.  She was seen by Dr. Gae Gallop who performed a thrombectomy under local anesthesia with return of graft function.  The patient underwent normal hemodialysis on the morning of Jan 27, 2011.  She is feeling well.  She is prepared for discharge on the second hospital day.  DISCHARGE PLANNING:  The patient was discharged home on Jan 27, 2011. She will continue her normal dialysis schedule Mondays, Wednesdays, and Fridays at the [facility].  We will reschedule her total parathyroidectomy with autotransplantation for a date in the near future.  In the interim, we will proceed with ultrasound-guided fine- needle aspiration biopsy of her thyroid nodules.  FINAL DIAGNOSES: 1. End-stage renal disease. 2. Secondary hyperparathyroidism. 3. Thyroid  nodules.  CONDITION AT DISCHARGE:  Good.     Earnstine Regal, MD     TMG/MEDQ  D:  01/27/2011  T:  01/27/2011  Job:  [id number]  cc:   Jeneen Rinks L. Deterding, M.D.  Electronically Signed by Armandina Gemma MD on 02/02/2011 12:05:28 PM

## 2011-02-02 NOTE — Assessment & Plan Note (Signed)
OFFICE VISIT   Victoria [name], Victoria [name]  DOB:  01/19/1956                                       09/23/2010  [id number]   I saw patient in the office today for evaluation of an aneurysm of her  right AV fistula.  This a pleasant 55 year old woman referred by Dr.  Jimmy Footman.  She has an aneurysm along her AV fistula, which has not been  tender.  She has had no pain associated with this.  She states that her  graft has been working well.   On review of systems, she has had no fever or chills.   On examination, this is a pleasant 55 year old woman who appears her  stated age.  Blood pressure is 104/73.  Heart rate is 98.  Saturation  97%.  Temperature is 98.3.  She has an aneurysm along her graft which I  think is large enough that we need to consider revising this.   I have discussed bypassing around this area and saving enough of the  graft so that they can continue her dialysis.  This has been scheduled  for 09/24/2010.     Judeth Cornfield. Scot Dock, M.D.  Electronically Signed   CSD/MEDQ  D:  10/07/2010  T:  10/07/2010  Job:  [id number]

## 2011-02-02 NOTE — Assessment & Plan Note (Signed)
OFFICE VISIT   Victoria [name], Victoria [name]  DOB:  [birth year]                                       03/11/2010  [id number]   The patient returns for followup today.  She underwent revision of her  right forearm AV graft for a degenerated segment on 02/24/2010.  She  returns today for further followup.  On exam today, the graft has an  audible bruit.  She was using it for dialysis today without difficulty.  All the incisions are well-healed laterally.  All sutures were removed  today.  She will follow up with [place] on an as-needed basis.     Jessy Oto. Fields, MD  Electronically Signed   CEF/MEDQ  D:  03/11/2010  T:  03/11/2010  Job:  [id number]   cc:   Jeneen Rinks L. Deterding, M.D.

## 2011-02-03 ENCOUNTER — Other Ambulatory Visit: Payer: Self-pay | Admitting: Interventional Radiology

## 2011-02-03 ENCOUNTER — Ambulatory Visit
Admission: RE | Admit: 2011-02-03 | Discharge: 2011-02-03 | Disposition: A | Discharge status: Home / Self Care | Payer: Medicare Other | Source: Ambulatory Visit | Attending: Surgery | Admitting: Surgery

## 2011-02-03 ENCOUNTER — Other Ambulatory Visit (HOSPITAL_COMMUNITY)
Admission: RE | Admit: 2011-02-03 | Discharge: 2011-02-03 | Disposition: A | Discharge status: Home / Self Care | Payer: Medicare Other | Source: Ambulatory Visit | Attending: Interventional Radiology | Admitting: Interventional Radiology

## 2011-02-03 DIAGNOSIS — E041 Nontoxic single thyroid nodule: Secondary | ICD-10-CM

## 2011-02-03 DIAGNOSIS — R220 Localized swelling, mass and lump, head: Secondary | ICD-10-CM | POA: Insufficient documentation

## 2011-02-05 NOTE — Discharge Summary (Signed)
NAME:  Victoria [name], Victoria [name]                           ACCOUNT NO.:  [id number]   MEDICAL RECORD NO.:  [id number]                   PATIENT TYPE:  INP   LOCATION:  5524                                 FACILITY:  [facility]   PHYSICIAN:  Windy Kalata, M.D.          DATE OF BIRTH:  02-27-1956   DATE OF ADMISSION:  09/10/2002  DATE OF DISCHARGE:  09/11/2002                                 DISCHARGE SUMMARY   ADMISSION DIAGNOSES:  1. Pulmonary edema/volume overload.  2. Hypertension.  3. Shortness of breath/cough secondary to pulmonary edema/volume overload     and hypertension.  4. End-stage renal disease/chronic hemodialysis.  5. History of noncompliance with medications.   DISCHARGE DIAGNOSES:  1. Pulmonary edema.  2. Hypertension.  3. Borderline positive hCG with recent negative ultrasound except for     fibroids.  4. End-stage renal disease/chronic hemodialysis.  5. Anemia of chronic disease.  6. Secondary hypoparathyroidism.   BRIEF HISTORY:  Per the admission the patient is a 55 year old black female  with end-stage renal disease on chronic hemodialysis [facility] presents to the ER complaining of shortness of breath and swelling in  her lower extremities.  She said two new blood pressure meds she takes  caused both of her lower extremities to start swelling and she stopped those  two medications 2 weeks ago.  She was on Procardia and Norvasc but she  stopped but those two medications 2 weeks ago by her report.   She denied any fever, nausea, vomiting nor diarrhea but reported shortness  of breath has been increasing over the past 24 hours and said she had a  fluttering sensation in her heart.   In the ER noted to have pulmonary edema on chest x-ray and she was admitted  to hemodialysis to have emergent hemodialysis/diuresis.   COURSE IN THE HOSPITAL:  Problem 1. PULMONARY EDEMA.  The patient had noted  about 2 to 3+ nonpitting pedal edema, right somewhat  more than the left on  admission, admitted by Dr. Mercy Moore.  She had [place] with net total of 4381.  Post weight was 76.6 kg.  Noted her outpatient hemodialysis dry weight is  78.5 kg.  Now we started her new dry weight and it will be 76.0 kg.  Blood  pressure on admission was 155/103; at the dialysis unit after hemodialysis  her pressure was coming down to 147/94.  Again, she will have a new dry  weight to help control her pulmonary edema/volume overload and will need  possible tapering down of dry weight also at the kidney center.   Problem 2. HYPERTENSION.  The patient noted to have been placed on Norvasc  in November but she said it was causing ankle edema and headaches and there  is an order on August 20, 2002 to stop this and begin Procardia XL 60 mg  q.d. by Dr. Hassell Done.  Her dry weight  was also decreased somewhat then too, but  she continues to be hypertensive now, as noted on admission she told us she  had stopped both medications Procardia and Norvasc.  Blood pressure on  admission did improve with diuresis on hemodialysis.  She will be discharged  home on methyldopa 350 mg b.i.d. with follow up on pregnancy testing as  noted.   Problem 3. BORDERLINE POSITIVE HCG PREGNANCY TEST.  The patient stated she  had missed her menses since October 2003, requested a pregnancy test, the  hCG qualitative serum came back borderline positive and her hCG quantitative  level was 21.  It should be noted the patient has been followed by  outpatient OB-GYN for this positive borderline pregnancy testing as an  outpatient.  A pelvic ultrasound was done on August 22, 2002 showing only  fibroids.  Dr. Mercy Moore left a message OB-GYN physician to call him about  further workup as an outpatient.   Problem 4. END-STAGE RENAL DISEASE.  The patient will continue hemodialysis  Monday, Wednesday, Friday schedule and taper her dry weight down at the  dialysis unit as needed.   Problem 5. ANEMIA.  The patient  will continue on Epogen as an outpatient.  Hemoglobin during this admission was stable at 11.0.   Problem 6. SECONDARY HYPOPARATHYROIDISM.  The patient will continue on  Calcijex same dose 4.0 mcg at the outpatient unit as she had been given  here.  No changes were made in her phosphate binders, also Tums EX four each  meal, also she will take two at bedtime snacks.   MEDICATIONS AT TIME OF DISCHARGE:  1. Methyldopa 250 mg one q.12h.  2. Nephro-Vite one q.d.  3. Tums EX four each meal and two with bedtime snack.  4. Epogen at the dialysis unit 3000 units every weekly.  5. INFeD 50 mg every weekly.  6. Calcijex 4.0 mcg IV every hemodialysis.     Foye Clock, P.A.                    Windy Kalata, M.D.    DWZ/MEDQ  D:  09/11/2002  T:  09/12/2002  Job:  [id number]   cc:   [place]

## 2011-02-05 NOTE — Op Note (Signed)
NAME:  Victoria [name], Victoria [name]                           ACCOUNT NO.:  [id number]   MEDICAL RECORD NO.:  [id number]                   PATIENT TYPE:  OIB   LOCATION:  2858                                 FACILITY:  [facility]   PHYSICIAN:  Rosetta Posner, M.D.                 DATE OF BIRTH:  03-08-1956   DATE OF PROCEDURE:  12/06/2003  DATE OF DISCHARGE:  12/06/2003                                 OPERATIVE REPORT   PREOPERATIVE DIAGNOSIS:  End-stage renal disease with occluded left upper  arm A-V Gore-Tex.   POSTOPERATIVE DIAGNOSIS:  End-stage renal disease with occluded left upper  arm A-V Gore-Tex.   PROCEDURE:  Thrombectomy, interposition jump graft revision of the left  upper and A-V Gore-Tex graft.   SURGEON:  Rosetta Posner, M.D.   ASSISTANT:  Jacinta Shoe, P.A.C.   ANESTHESIA:  MAC.   COMPLICATIONS:  None.   DISPOSITION:  To Recovery Room stable.   PROCEDURE IN DETAIL:  The patient was taken to the operating room and placed  in the supine position where the area of the left arm was prepped and draped  in the usual sterile fashion.  An incision was made over the graft.  Venous  anastomosis in the axilla and carried down to isolate the graft.  Good vein  anastomosis.  The graft was opened near the venous anastomosis.  There was a  stenosis at the venous anastomosis.  The vein proximal to this was of good  caliber.  The graft, itself, was thrombectomized.  Arterialized plug was  removed, and excellent inflow was encountered.  This was flushed with  heparinized saline and re-occluded.  The vein was excised superiorly and  then proximally and was occluded.  The old anastomosis was excised.  The  vein was spatulated, and a 7 mm Gore-Tex interpositional graft was brought  onto the field.  This was spatulated and sewn end to end to the vein and end  to end in the old graft.  The clamps were removed, and excellent thrill was  noted.  The wounds were irrigated with saline.  Hemostasis  with  electrocautery.  The wounds were closed with 3-0 Vicryl in the subcutaneous  and subcuticular tissue, and then Steri-Strips were applied.                                               Rosetta Posner, M.D.    TFE/MEDQ  D:  12/06/2003  T:  12/09/2003  Job:  [id number]

## 2011-02-05 NOTE — Group Therapy Note (Signed)
NAME:  Victoria [name], Victoria [name]                           ACCOUNT NO.:  [id number]   MEDICAL RECORD NO.:  [id number]                   PATIENT TYPE:  OUT   LOCATION:  [facility]                           FACILITY:  [facility]   PHYSICIAN:  Darron Doom, MD                     DATE OF BIRTH:  18-Jan-1956   DATE OF SERVICE:  10/17/2003                                    CLINIC NOTE   CHIEF COMPLAINT:  Follow up data.   HISTORY OF PRESENT ILLNESS:  This is a 55 year old black female who has a  long history - 14 years - on dialysis.  Apparently she had hypertensive  renal disease that led to chronic dialysis.  She reports that currently she  is trying desperately to get pregnant despite the fact that she is on  dialysis and 55 years old.  Apparently she had a history of being admitted  to the hospital in December 2003 with an elevated beta that was explained at  60.  Apparently she somehow was found by her gynecologist, Dr. Rolland Bimler, to have a beta of 54 and had an ultrasound that showed nothing in  the uterus.  She was then referred to high risk clinic but because she did  not have a viable IUP documented she sent her back for another ultrasound  which continued to fail to show a viable IUP but did show multiple fibroids  which had been documented previously.  Additionally, she had a repeat beta  hCG that was only 15.  She also reports that her periods are every 3 months  and irregular.  She reports that she does have some chronic abdominal pain  that she relates to fibroids as well as new-onset headaches.   PAST MEDICAL HISTORY:  Significant for hypertension, end-stage renal disease  - dialysis Monday/Wednesday/Friday, history of noncompliance, secondary  hypoparathyroidism, hypertension, and pulmonary edema.   PHYSICAL EXAMINATION TODAY:  VITAL SIGNS:  Temperature is 97.9, pulse is 85,  blood pressure is 100/70, weight 174.5.  GENERAL:  She is a well-developed, well-nourished black female in  no acute  distress.  ABDOMEN:  Soft, nontender, nondistended.  The uterus is approximately 12  weeks size with no other significant pelvic findings.   IMPRESSION:  1. Probable complete AB versus persistent elevated quantitative beta hCG     related to hemodialysis.  2. End-stage renal disease on hemodialysis.  3. Hypertension.  4. Fibroid uterus.  5. Strongly desires fertility.   PLAN:  This patient was told rather bluntly today that she should not  attempt pregnancy.  It is felt that not only would it be unsuccessful but if  the pregnancy is successful could be life-threatening.  The patient  absolutely states that she does not want this and has in her mind somewhere  that the fibroids are what is causing her to not be able to conceive.  I  tried to explain to this patient that it is likely not that and that I did  not see a point in doing elective myomectomy.  Also, knowing that she should  not get pregnant for 6 months after a myomectomy she will be close to 49  after that time.  The patient still strongly desires the surgery for her  fertility although it is unlikely that she will be able to conceive no  matter what.  I have advised her to return to her primary doctors at  [facility] and have them give clearance for elective surgery.  I do not feel  that she is a great candidate for this but I have been unable to talk her  out of this plan of action.                                               Darron Doom, MD    TP/MEDQ  D:  10/17/2003  T:  10/18/2003  Job:  [id number]   cc:   Gracy Racer  Fax: [facility]

## 2011-02-05 NOTE — Op Note (Signed)
[place]  Patient:    Victoria [name], Victoria [name] Visit Number: [id number] MRN: [id number]          Service Type: DSU Location: [facility] 2899 26 Attending Physician:  Mechele Collin Dictated by:   Mechele Collin, M.D. Proc. Date: 04/10/02 Admit Date:  04/10/2002 Discharge Date: 04/10/2002   CC:         CVTS Office   Operative Report  PREOPERATIVE DIAGNOSIS:  End-stage renal disease.  POSTOPERATIVE DIAGNOSIS:  End-stage renal disease.  PROCEDURES: 1. Right internal jugular _______ catheter placement (28 cm). 2. Preoperative ultrasound vein localization. 3. Intraoperative vena cavogram.  SURGEON:  Mechele Collin, M.D.  ANESTHESIA:  Local MAC.  ESTIMATED BLOOD LOSS:  20 cc.  DRAINS:  None.  SPECIMENS:  None.  COMPLICATIONS:  None.  BRIEF HISTORY:  This is a 55 year old black female with end-stage renal disease who had a new catheter and graft placed in her left arm approximately one week ago and the catheter had subsequently failed.  She was scheduled for replacement of this catheter or placement of a new catheter today.  DESCRIPTION OF PROCEDURE:  The patient was brought to the operating room and placed on the operating table in the supine position.  Following adequate IV sedation, the neck was examined with ultrasound and it was determined that the right internal jugular vein was widely patent.  Next, the neck was draped and prepped in a sterile fashion.  One percent lidocaine was used to anesthetize the skin in the cervical region, and a small finder needle was used to cannulate the right internal jugular vein.  Next, a large caliber introducer needle was introduced into the internal jugular vein without difficulty.  An 0.035 guide wire was advanced; however, it met some resistance at the level of the clavicle.  An intraoperative contrast injection demonstrated a stenosis at the junction of the internal jugular vein and the right subclavian  vein. Next, an 0.035 Glidewire was used to advanced across this area without difficulty.  The puncture site in the next was extended to a small incision. The first dilator was advanced down the IJ into the [facility] without resistance, and a contrast injection was performed to ensure that the tip of the dilator was, in fact, in the [facility] at the level of the right atrium.  The 0.035 J-wire was then reinserted through the dilator, and the dilator was removed.  The remainder of the serial dilatation was performed over this guide wire, and ultimate large caliber introducer and Peel-Away sheath was advanced over the guide wire.  The introducer was removed, and the catheter was advanced into position at the level of the right atrium without difficulty.  The Peel-Away sheath was removed.  Next, a site on the chest wall was selected and anesthetized with 1% lidocaine.  A small stab incision was made.  The catheter was then tunneled in retrograde fashion and brought out through the exit site on the chest.  The ports were then attached, and both ports were found to flush and aspirate easily.  The tip of the catheter was confirmed at the level of the right atrium, and there was no evidence of kink at the insertion site of the catheter into the vein.  At this point, the neck wound was closed with interrupted 4-0 Vicryl suture, and the catheter was secured to the chest wall with interrupted 3-0 nylon suture.  Sterile dry dressings were applied.  The patient was then awakened from  anesthesia and transferred to the recovery room in stable condition.  The patient tolerated the procedure well.  No complications.  All needle and sponge counts were correct. Dictated by:   Mechele Collin, M.D. Attending Physician:  Mechele Collin. DD:  04/10/02 TD:  04/12/02 Job: [id number]

## 2011-02-05 NOTE — Op Note (Signed)
Victoria [name], Victoria [name]                 ACCOUNT NO.:  [id number]   MEDICAL RECORD NO.:  [id number]          PATIENT TYPE:  OIB   LOCATION:  2899                         FACILITY:  [place]   PHYSICIAN:  Rosetta Posner, M.D.    DATE OF BIRTH:  02-Oct-1955   DATE OF PROCEDURE:  11/30/2004  DATE OF DISCHARGE:  11/30/2004                                 OPERATIVE REPORT   PREOPERATIVE DIAGNOSIS:  End-stage renal disease with occluded left upper  arm arteriovenous Gore-Tex graft.   POSTOPERATIVE DIAGNOSIS:  End-stage renal disease with occluded left upper  arm arteriovenous Gore-Tex graft.   PROCEDURE:  Thrombectomy, interposition jump graft revision to higher  axillary vein of left upper arm AV Gore-Tex graft.   SURGEON:  Rosetta Posner, M.D.   ASSISTANT:  Nurse.   ANESTHESIA:  General endotracheal.   COMPLICATIONS:  None.   DISPOSITION:  To recovery room, stable.   PROCEDURE IN DETAIL:  The patient was taken to the operating room and placed  in the supine position with the area of the left arm and left axilla prepped  and draped in usual sterile fashion.  An incision was made over the axillary  incision, carried down to isolate the graft of the end-anastomosis.  The  patient had a very large axillary vein.  The graft was opened through a  prior revision site near the venous anastomosis.  The graft was opened and  there was a tight stenosis at the venous anastomosis.  The graft itself was  thrombectomized, arterialized plug was removed, and good inflow was  encountered.  This was flushed with heparinized saline and reoccluded  this  graft has been present for some time and there was some mild degeneration  through the graft with removal of the Fogarty catheter.  The graft was  flushed with heparinized saline and reoccluded.  A new interposition graft 7  mm Gore-Tex was brought onto the field.  The old venous anastomosis was  excised.  The vein was spatulated.  A new 7 mm Gore-Tex graft was  sewn end-  to-end to the vein with running 6-0 Prolene suture.  This was then flushed  with heparinized saline and re-occluded.  The graft was cut to appropriate  length and was sewn end-to-end to the old graft with a  running 6-0 Prolene suture.  Clamp was removed and excellent thrill was  noted.  The wound was irrigated with saline, and hemostasis with  electrocautery.  Wound was closed with 3-0 Vicryl in subcutaneous,  subcuticular tissue.  Steri-Strips were applied.      TFE/MEDQ  D:  11/30/2004  T:  11/30/2004  Job:  [id number]

## 2011-02-05 NOTE — Op Note (Signed)
Victoria [name], Victoria [name]                 ACCOUNT NO.:  [id number]   MEDICAL RECORD NO.:  [id number]          PATIENT TYPE:  AMB   LOCATION:  SDS                          FACILITY:  [facility]   PHYSICIAN:  Nelda Severe. Kellie Simmering, M.D.  DATE OF BIRTH:  1956/02/01   DATE OF PROCEDURE:  04/01/2006  DATE OF DISCHARGE:                                 OPERATIVE REPORT   PREOPERATIVE DIAGNOSIS:  End stage renal disease with thrombosed  arteriovenous Gore-Tex graft, right arm.   POSTOPERATIVE DIAGNOSIS:  End stage renal disease with thrombosed  arteriovenous Gore-Tex graft, right arm.   OPERATION:  Thrombectomy arteriovenous Gore-Tex graft right arm with  insertion interposition graft from Gore-Tex to basilic vein with 6 mm Gore-  Tex.   SURGEON:  Nelda Severe. Kellie Simmering, M.D.   FIRST ASSISTANT:  Richardson Dopp, P.A.-C.   ANESTHESIA:  Local.   PROCEDURE:  The patient was taken to the operating room, placed in the  supine position at which time the right upper extremity was prepped with  Betadine scrub solution and draped in a routine sterile manner.  After  infiltration of 1% Xylocaine with epinephrine, a longitudinal incision was  made through the antecubital area over the Gore-Tex to vein anastomosis.  The graft was dissected free proximally and the vein distally and it was a  nice 5.5 mm vein, although there was obvious thickening adjacent to the  anastomosis.  3000 units of heparin were given intravenously.  The graft was  opened at its anastomotic area.  A #5 Fogarty catheter was passed around the  graft easily thrombectomizing the graft with good inflow being present.  The  intimal hyperplasia extended about 2 cm in the vein proximal to this and  proximal to two moderate branches was excellent being at least 5 mm in size.  A new piece of 6 mm graft was anastomosed end-to-end to the old graft and  end-to-end to the proximal vein with 6-0 Prolene.  The clamps were then  released and there was an excellent  pulse and thrill in the graft.  No  protamine was given.  The wounds were irrigated with saline, closed in  layers with Vicryl in a subcuticular fashion.  A sterile dressing was  applied.  The patient was taken to the recovery room in satisfactory  condition.           ______________________________  Nelda Severe Kellie Simmering, M.D.     JDL/MEDQ  D:  04/01/2006  T:  04/01/2006  Job:  [id number]

## 2011-02-05 NOTE — Op Note (Signed)
Victoria [name], Victoria [name]                 ACCOUNT NO.:  [id number]   MEDICAL RECORD NO.:  [id number]          PATIENT TYPE:  OIB   LOCATION:                               FACILITY:  [facility]   PHYSICIAN:  Nelda Severe. Kellie Simmering, M.D.  DATE OF BIRTH:  1956-08-03   DATE OF PROCEDURE:  03/01/2005  DATE OF DISCHARGE:                                 OPERATIVE REPORT   PREOP DIAGNOSIS:  Thrombosed AV Gore-Tex graft left upper arm.   POSTOP DIAGNOSIS:  Thrombosed AV Gore-Tex graft left upper arm.   OPERATION:  Thrombectomy AV Gore-Tex graft left upper arm with exploration  of venous end-to-end arterial end and a dilatation of graft with manual  removal of debris from arterial anastomosis.   SURGEON:  Nelda Severe. Kellie Simmering, M.D.   FIRST ASSISTANT:  Jacinta Shoe, P.A.   ANESTHESIA:  Local.   DESCRIPTION OF PROCEDURE:  The patient was taken to the operating room,  placed in the supine position at which time the left upper extremity was  prepped with Betadine scrub and solution and draped in a routine sterile  manner. After infiltration of 1% Xylocaine, a longitudinal incision was made  through the previous scar in the axilla where a Gore-Tex axillary vein  anastomosis was dissected free proximally and distally. This had been  revised only a few months earlier. There was old graft which was medial to  the existing graft which had multiple degenerative changes.   After 3000 units of heparin was given intravenously, a transverse opening  was made in the graft proximal to the venous anastomosis.  The venous  anastomosis itself was widely patent, easily accepting a 5 dilator. The  Fogarty would pass centrally without difficulty. There was excellent  backbleeding. Passing the Fogarty around the graft, there were few areas of  degeneration.  It was slightly difficult to traverse the arterial  anastomosis. I could never get adequate inflow, therefore, a second incision  was made near the arterial anastomosis  in the distal upper arm.   Initially a transverse incision was made the graft about 3-4 cm from the  arterial anastomosis; and the graft, itself, was dilated with a 5 dilator  with a few the areas being improved in caliber. Following this, dilator was  passed through the arterial anastomosis; and it appeared that there was much  improved inflow so the graft was reclosed with continuous 6-0 Prolene; but  there still did not appear to be adequate inflow adjacent to the venous  anastomosis. Therefore, the graft was dissected even closer to the brachial  arterial anastomosis. Proximal and distal control of the brachial artery  obtained.  Transverse opening made in the graft a centimeter from the  anastomosis so that it could be explored under direct vision. There was a  piece of debris which had been lodged in the arterial anastomosis about 1-  1.5 cm in diameter; white and fibrous in nature. This was removed and then  inflow was normal. The graft was  reclosed with 6-0 Prolene on both ends; and there was excellent pulse and  thrill in the graft. No protamine was given.  The wound irrigated with  saline, closed in layers with Vicryl in a subcuticular fashion. Sterile  dressing applied. The patient taken to recovery in satisfactory condition.           ______________________________  Nelda Severe Kellie Simmering, M.D.     JDL/MEDQ  D:  03/01/2005  T:  03/01/2005  Job:  [id number]

## 2011-02-05 NOTE — Op Note (Signed)
Victoria [name], Victoria [name]                 ACCOUNT NO.:  [id number]   MEDICAL RECORD NO.:  [id number]          PATIENT TYPE:  INP   LOCATION:  5511                         FACILITY:  [facility]   PHYSICIAN:  Michelle L. Grewal, M.D.DATE OF BIRTH:  Aug 23, 1956   DATE OF PROCEDURE:  03/05/2005  DATE OF DISCHARGE:                                 OPERATIVE REPORT   PREOPERATIVE DIAGNOSIS:  Symptomatic fibroids.   POSTOPERATIVE DIAGNOSIS:  Symptomatic fibroids.  Endometriosis involving the  right ovary.   PROCEDURE:  Total abdominal hysterectomy, right ovarian cystectomy.   SURGEON:  Michelle L. Helane Rima, M.D.   ASSISTANT:  Dallie Dad, M.D.   ANESTHESIA:  General.   SPECIMENS:  Uterus and cervix.   ESTIMATED BLOOD LOSS:  300 cc.   COMPLICATIONS:  None.   PROCEDURE:  Patient was taken to the operating room.  She was intubated  without difficulty.  She was prepped and draped in the usual sterile  fashion.  A low transverse incision was made and carried down to the fascia.  The fascia was scored in the midline and extended laterally.  The peritoneum  was entered bluntly.  The peritoneal incision was then stretched.  The  uterus was identified.  There were some adhesions of the fundus from the  mesentery, which were lysed using sharp dissection.  We then placed a self-  retaining retractors in the abdominal cavity and placed the large and small  bowel in the upper abdomen.  The patient is status post left tubal  salpingectomy and left salpingo-oophorectomy.  She had an absent left ovary.  The right ovary has a small amount of endometriosis on it.  That was  actually cauterized with the Bovie.  We then performed a hysterectomy in the  following fashion:  We noted multiple fibroids, one large golf-ball-sized  one that was anterior.  By clamping the tip of the pedicle and ___________,  a specimen was cut and suture-ligated using a Vicryl suture.  We worked our  way down the broad ligament,  staying just beside the uterus.  Each pedicle  was cut and suture-ligated using an 0 Vicryl suture. We then created a  bladder flap sharply and then digitally and then clamped the uterine  arteries at the level of the internal os.  Each pedicle was cut and suture-  ligated using an 0 Vicryl suture.  The specimen was removed at the level of  the cervical isthmus junction using a knife for better visualization of the  cervix.  We then created a bladder flap and then started clamping just  inside the cervix using straight Haney clamps.  We worked our way down to  the external os using curved Haney clamps.  We entered the vagina.  The  cervix was then removed using Jorgenson scissors.  The vaginal cuff was  closed in a series of figure-of-eight's using 0 Vicryl suture.  The  irrigation was performed.  Hemostasis was noted.  Interceed was placed over  the vaginal cuff, as those intestines seemed to lay right on top of that, to  hopefully minimize  any chance of intestinal adhesions later on.  All  pedicles were hemostatic.  All instruments and pads were removed from the  abdominal cavity.  The peritoneum was closed using 0 Vicryl in a continuous  running stitch.  The rectus muscles were reapproximated using 0 Vicryl.  The  fascia was closed using 0  Vicryl through an ___________ and meeting in the midline.  After irrigation  of the subcutaneous layers, the skin was closed.  All sponge, lap, and  instrument counts were correct x2.  The patient tolerated the procedure well  and went to the recovery room in a stable condition.       MLG/MEDQ  D:  03/05/2005  T:  03/05/2005  Job:  [id number]

## 2011-02-05 NOTE — Op Note (Signed)
[place]  Patient:    Victoria [name], Victoria [name] Visit Number: [id number] MRN: [id number]          Service Type: OUT Location: [place] Attending Physician:  Donetta Potts Dictated by:   Nelda Severe Kellie Simmering, M.D. Proc. Date: 04/02/02 Admit Date:  04/03/2002 Discharge Date: 04/03/2002                             Operative Report  PREOPERATIVE DIAGNOSIS:  End-stage renal disease, thrombosed AV Gore-Tex graft, left upper arm.  POSTOPERATIVE DIAGNOSIS:  End-stage renal disease thrombosed AV Gore-Tex graft, left upper arm.  OPERATION: 1. Attempted thrombectomy, AV Gore-Tex graft left upper arm, unsuccessful. 2. Insertion of a new left upper arm AV Gore-Tex graft and brachial artery to    axillary vein, 6 mm Gore-Tex. 3. Insertion left internal jugular Quinton hemodialysis catheter.  SURGEON:  Nelda Severe. Kellie Simmering, M.D.  FIRST ASSISTANT:  Gina H. Collins, P.A.-C.  ANESTHESIA:  Local.  PROCEDURE:  The patient was taken to the operating room and placed in the supine position at which time the left upper extremity was prepped wit Betadine scrubbing solution and draped in the routine sterile manner.  After infiltration of 1% Xylocaine with epinephrine a longitudinal incision was made through the wound in the axilla and the Gore-Tex axillary vein anastomosis was resected free.  Heparin 3000 given intravenously, transverse opening made in the graft.  An attempt was made to thrombectomize the graft but there were two areas of severe degeneration in the mid portion of the graft with small pseudoaneurysms, making thrombectomy impossible.  Another incision was made near the arterial end where the Gore-Tex was anastomosed to the brachial artery.  This was dissected free and transected and the stump of the Gore-Tex easily thrombectomized with excellent inflow.  A new piece of 6 mm graft was then tunneled peripheral to the old graft, anastomosed end-to-end to the  stump of graft on the arterial end with 6-0 Prolene.  The venous anastomosis was excised.  The vein proximal to this was 6 mm in size and the Gore-Tex anastomosed end-to-end with 6-0 Prolene.  The clamp is then released and there was a good pulse and thrill in the graft.  No protamine was given.  Wound is irrigated with saline, closed with Vicryl in the subcuticular fashion. Dressing applied.  Neck and chest area then prepped with Betadine solution, draped in the routine sterile manner.  The right internal jugular vein initially was attempted but after multiple attempts no success was made to enter the right internal jugular vein.  We then entered the left internal jugular vein using a left supraclavicular approach.  Guidewire passed across the mediastinum into the right atrium.  We dilated this and attempted to initially insert a Diatek catheter but could not get the peel-away sheath across the mediastinum because of the tortuosity.  Therefore, Quinton catheter was tunneled into the wound and passed over the guidewire into the superior vena cava.  The guidewire was removed and both ports flushed easily with heparin and saline.  The catheter was secured with nylon suture, the wound closed with Vicryl in a subcuticular fashion and a sterile dressing applied. The patient was taken to the recovery room in satisfactory condition. Dictated by:   Nelda Severe Kellie Simmering, M.D. Attending Physician:  Donetta Potts DD:  04/02/02 TD:  04/04/02 Job: [id number]

## 2011-02-05 NOTE — Op Note (Signed)
   NAME:  Victoria [name], Victoria [name]                           ACCOUNT NO.:  [id number]   MEDICAL RECORD NO.:  [id number]                   PATIENT TYPE:  OIB   LOCATION:  2867                                 FACILITY:  [facility]   PHYSICIAN:  Nelda Severe. Kellie Simmering, M.D.               DATE OF BIRTH:  1956/08/22   DATE OF PROCEDURE:  03/29/2003  DATE OF DISCHARGE:  03/29/2003                                 OPERATIVE REPORT   PREOPERATIVE DIAGNOSIS:  Thrombosed arteriovenous Gore-Tex graft, left upper  arm.   POSTOPERATIVE DIAGNOSIS:  Thrombosed arteriovenous Gore-Tex graft, left  upper arm.   OPERATION:  Thrombectomy of arteriovenous Gore-Tex graft, left upper arm,  with revision of venous end.   SURGEON:  Nelda Severe. Kellie Simmering, M.D.   FIRST ASSISTANT:  John Giovanni, P.A.-C.   ANESTHESIA:  Local.   DESCRIPTION OF PROCEDURE:  The patient was taken to the operating room and  placed in the supine position, at which time the left upper extremity was  prepped with Betadine scrub and solution and draped in the routine sterile  manner.  After infiltration of 1% Xylocaine, a longitudinal incision was  made in the axilla through the previous scar.  The Gore-Tex to axillary vein  anastomosis was dissected free proximally and distally and 3000 units of  heparin given intravenously.  A transverse opening was made in the graft  just proximal to the anastomosis.  The graft itself was easily  thrombectomized with excellent inflow being re-established.  There was some  mild to moderate intimal hyperplasia circumferentially at the venous  anastomosis, but the vein proximal to this was at least 5 mm in size.  A  Fogarty catheter could be passed centrally with no clot in the venous system  beyond the anastomosis.  The previous anastomosis was excised and after  mobilizing the vein and the graft, there was enough length to reanastomose  it end-to-end with 6-0 Prolene without any interposition graft.  When this  was  completed the clamp released, and there was a good pulse and thrill in  the graft and excellent flow.  No protamine was given.  The wound was  irrigated with saline, closed in layers with Vicryl in a subcuticular  fashion and Steri-Strips, a sterile dressing applied, patient taken to the  recovery room in satisfactory condition.                                               Nelda Severe Kellie Simmering, M.D.    JDL/MEDQ  D:  03/29/2003  T:  03/30/2003  Job:  [id number]

## 2011-02-05 NOTE — Op Note (Signed)
Victoria [name], Victoria [name]                 ACCOUNT NO.:  [id number]   MEDICAL RECORD NO.:  [id number]          PATIENT TYPE:  OIB   LOCATION:  2899                         FACILITY:  [facility]   PHYSICIAN:  Judeth Cornfield. Scot Dock, M.D.DATE OF BIRTH:  07-29-1956   DATE OF PROCEDURE:  03/25/2005  DATE OF DISCHARGE:                                 OPERATIVE REPORT   PREOPERATIVE DIAGNOSIS:  Chronic renal failure.   POSTOPERATIVE DIAGNOSIS:  Chronic renal failure.   PROCEDURE:  Placement of new right forearm AV graft.   SURGEON:  Dr. Scot Dock.   ASSISTANT:  Izetta Dakin, RNFA.   ANESTHESIA:  Local with sedation.   TECHNIQUE:  The patient was taken to the operating room, sedated by  anesthesia. The right upper extremity was prepped and draped in the usual  sterile fashion. The patient had had a previous preoperative vein mapping  which showed that her cephalic vein was not adequate for a fistula on the  right. A longitudinal incision was made just above the antecubital space  after the skin was anesthetized and the brachial artery was dissected free.  It was somewhat small but reasonable sized. The adjacent basilic vein was  dissected free and ligated distally.  It irrigated up nicely with  heparinized saline took a 5 mm dilator. A 4-7 mm graft was then tunneled in  a loop fashion in the forearm with the arterial aspect of the graft along  the radial aspect of the forearm and the venous aspect of the graft along  the ulnar aspect of the forearm. The patient was then heparinized. The  brachial artery was clamped proximally and distally and a longitudinal  arteriotomy was made. A segment of 4-mm end of the graft was excised. The  graft was spatulated and sewn end-to-side to the brachial artery using  continuous 6-0 Prolene suture. The graft was then pulled to the appropriate  length for an anastomosis to the basilic vein. The vein was spatulated  proximally. The graft was cut to the appropriate  length, spatulated and sewn  end-to-end to the vein using continuous 6-0 Prolene suture. At the  completion, there was a good thrill in the graft and a palpable radial  pulse. Hemostasis was obtained in the wounds and the wounds were closed with  a deep layer of 3-0 Vicryl. The skin closed with 4-0 Vicryl. Sterile  dressing was applied. The patient tolerated the procedure well and was  transferred to recovery room in satisfactory condition. All needle and  sponge counts were correct.       CSD/MEDQ  D:  03/25/2005  T:  03/25/2005  Job:  [id number]

## 2011-02-05 NOTE — Op Note (Signed)
NAME:  Victoria [name], Victoria [name]                           ACCOUNT NO.:  [id number]   MEDICAL RECORD NO.:  [id number]                   PATIENT TYPE:  OIB   LOCATION:  2899                                 FACILITY:  [facility]   PHYSICIAN:  Jessy Oto. Fields, MD               DATE OF BIRTH:  12-Jan-1956   DATE OF PROCEDURE:  02/27/2004  DATE OF DISCHARGE:  02/27/2004                                 OPERATIVE REPORT   PROCEDURE:  1. Thrombectomy and revision of left upper arm arteriovenous graft.  2. Insertion of Diatek catheter.  3. Neck ultrasound.   PREOPERATIVE DIAGNOSIS:  Thrombosed arteriovenous graft with end-stage renal  disease.   POSTOPERATIVE DIAGNOSIS:  Thrombosed arteriovenous graft with end-stage  renal disease.   ANESTHESIA:  Local with IV sedation.   ASSISTANT:  John Giovanni, P.A.-C.   INDICATIONS:  The patient is a 55 year old female who is postop day 1 status  post thrombectomy and revision of a left upper arm AV graft.  At that time  the venous and arterial anastomoses were widely patent.  She has  degeneration of the mid segment of the graft and this is felt to be the  cause for her re-thrombosis.   FINDINGS:  1. Widely patent venous and arterial anastomosis.  2. Entire graft replaced with 6-mm PTFE.  3. Right internal jugular vein with severe narrowing at the superior vena     cava junction.  4. A 23-cm Diatek catheter left internal jugular vein.   DESCRIPTION OF PROCEDURE:  After obtaining informed consent, the patient was  taken to the operating room.  The patient was placed in the supine position  on the operating table.  Next, a SiteRight ultrasound was used to localize  the left and right jugular veins.  Both of these were patent with normal  compressibility and respiratory variation.  Next, the patient's neck and  chest were prepped and draped in the usual sterile fashion.  She was placed  in Trendelenburg position.  A small finder needle was used to localize  the  right internal jugular vein.  Next, a larger introducer needle was used to  cannulate the right internal jugular vein.  A 0.035 J-tipped guide wire was  then threaded down into the jugular vein but this would not pass further  than approximately 3 cm.  Contrast injection through the jugular vein was  performed and this showed severe narrowing of the jugular vein down at the  level of the superior vena cava junction.  At this point the attempt to  insert the line on the right side was aborted.  Direct pressure was held for  approximately two minutes.  Attention was then turned to the left side of  the neck.  Again, this had a normal jugular vein by ultrasound.  Local  anesthesia was infiltrated over the left internal jugular vein and this was  initially cannulated  with a small finder needle and then a larger introducer  needle.  A 0.035 J-tipped guide wire was then threaded down into the right  atrium and into the inferior vena cava under fluoroscopic guidance.  The  left innominate system was quite tortuous.  Therefore, a 5 French end-hole  catheter was placed over the guide wire down into the inferior vena cava.  Then the guide wire was then exchanged for a 0.035 Amplatz wire to assist in  straightening the innominate system.  The 5 French end-holed catheter was  removed.  Then sequential 12, 14, and 16 French dilators with peel-away  sheath were placed into the right atrium under fluoroscopic guidance.  A 23-  cm Diatek catheter was then placed over the guide wire using a weave  technique into the right atrium.  The peel-away sheath was then removed  until the catheter was noted to be in the right atrium.  Next, the catheter  was tunneled subcutaneously to a more lateral position.  The catheter was  then sutured to the skin with a nylon stitch and the neck insertion site  closed with a Vicryl stitch.  The catheter was cut to length and the hub  attached.  The catheter was noted flush  and draw easily.  The catheter was  loaded with concentrated heparin solution.  The catheter was then inspected  under fluoroscopy and with its tip in the right atrium and no kinks  throughout its course all the way to the level of the skin.  The patient  tolerated this portion of the procedure well and there were no  complications.  The instrument, sponge, and needle counts were correct at  the end of this portion of the case.  Next, attention was turned towards the  left upper arm graft.  The patient's entire left upper extremity was prepped  and draped in the usual sterile fashion.  Local anesthesia was infiltrated  at the upper arm incision and the antecubital incision that had been placed  previously.  First, the antecubital incision was reopened and carried down  through the subcutaneous tissues and the graft was dissected free  circumferentially just above the level of the arterial anastomosis.  Next,  the upper arm incision was reopened and carried down through the  subcutaneous tissues and graft was dissected free circumferentially at the  level of the venous anastomosis.  Next, a 6-mm PTFE graft was then tunneled  in a loop configuration out over the upper arm lateral to the pre-existing  grafts.  The patient was then given 5000 units of intravenous heparin.  The  graft was then clamped right at the arterial anastomosis and transected.  A  #4 Fogarty catheter was passed through the arterial anastomosis and all  thrombotic material was removed until there was excellent arterial inflow.  The graft was then clamped at this level.  An end-of-graft anastomosis was  then created using running 6-0 Prolene suture.  Just prior to completion of  the anastomosis, everything was thoroughly flushed.  Hemostasis was obtained  with thrombin and Gelfoam.  Next, the graft was clamped up in the upper arm  incision.  A #4 Fogarty was also used up here to thrombectomize the venous end of the graft  after transecting this just above the level of the venous  anastomosis.  All thrombotic material was removed until there was good  venous backbleeding.  The 3, 4, and 5 coronary dilators easily passed  through the anastomosis.  Next, an end-of-graft to end-of-graft anastomosis  was completed using a running 6-0 Prolene suture.  Just prior to completion  of the anastomosis, the graft was fore-bled, back-bled, and thoroughly  flushed.  The anastomosis was secured, clamps were released.  There was  noted to be a palpable thrill in the graft immediately.  Next, hemostasis  was obtained.  The subcutaneous tissues of both incisions were  reapproximated using running 3-0 Vicryl suture.  Skin of both incisions was  then closed with a 4-0 Vicryl subcuticular stitch.  The patient tolerated  the procedure well.  There were no complications.  Sponge, needle, and  instrument counts were correct at the end of the case.  The patient was  taken to the recovery room in stable condition.                                               Jessy Oto. Fields, MD    CEF/MEDQ  D:  02/27/2004  T:  02/28/2004  Job:  [id number]

## 2011-02-05 NOTE — Discharge Summary (Signed)
   NAME:  Victoria [name], Victoria [name]                           ACCOUNT NO.:  [id number]   MEDICAL RECORD NO.:  [id number]                    PATIENT TYPE:   LOCATION:                                       FACILITY:  [facility]   PHYSICIAN:  Mechele Collin, M.D.                  DATE OF BIRTH:  10/01/1955   DATE OF ADMISSION:  04/10/2002  DATE OF DISCHARGE:  04/11/2002                                 DISCHARGE SUMMARY   ADMITTING DIAGNOSIS:  End-stage renal disease.   DISCHARGE DIAGNOSIS:  End-stage renal disease.   HOSPITAL COURSE:  The patient was admitted to [facility] on April 10, 2002 for a right internal jugular Diatek catheter and a preop vein  localization with ultrasound.  The patient also underwent angiography  venacavogram on this date.  No complications were noted during procedure.  Postoperatively, the patient was kept overnight secondary to hypokalemia.  This was treated.  The patient underwent hemodialysis and was deemed stable  for discharge home after hemodialysis the following day.   ACTIVITY:  The patient was told to avoid strenuous activity.   DIET:  Renal diet.   WOUND CARE:  The patient was told he could shower and clean the incision  with soap and water.   DISPOSITION:  Home.   FOLLOW UP:  The patient was told to follow up at [facility] as needed and to  resume hemodialysis as directed by her kidney doctor.     Mary Sella. Steward, P.A.                      Mechele Collin, M.D.    BGS/MEDQ  D:  06/27/2002  T:  06/30/2002  Job:  [id number]

## 2011-02-05 NOTE — Discharge Summary (Signed)
[name], [name]                 ACCOUNT NO.:  [id number]   MEDICAL RECORD NO.:  [id number]          PATIENT TYPE:  cinp   LOCATION:                               FACILITY:  [facility]   PHYSICIAN:  Michelle L. Helane Rima, M.D.    DATE OF BIRTH:   DATE OF ADMISSION:  03/05/2005  DATE OF DISCHARGE:  03/07/2005                                 DISCHARGE SUMMARY   ADMISSION DIAGNOSIS:  Symptomatic fibroids and chronic renal failure  requiring dialysis.   DISCHARGE DIAGNOSIS:  Symptomatic fibroids and chronic renal failure  requiring dialysis.   HOSPITAL COURSE:  The patient is a very pleasant 55 year old female who  underwent hysterectomy on the day of admission for symptomatic fibroids.  Her medical history is significant for chronic renal failure for which she  has end-stage renal disease secondary to hypertension and is actually  dialysis dependent.  On the day of hospitalization, it was noted that her  graft had clotted.  The patient underwent TAH without any difficulty;  however, did go to [facility] postoperatively because of the  necessity for dialysis while in the hospital.  Dr. Scot Dock, of vascular  surgery did place a left SCV Diatek catheter the day after her surgery and  she had dialysis performed.  On postoperative day #2, hemoglobin was 9.3.  She was doing very well on minimal pain medication.  She was discharged home  in excellent condition.  She will follow up with me in 1 week.      Michelle L. Helane Rima, M.D.  Electronically Signed     MLG/MEDQ  D:  03/16/2006  T:  03/16/2006  Job:  [id number]

## 2011-02-05 NOTE — Op Note (Signed)
Victoria [name], Victoria [name]                           ACCOUNT NO.:  [id number]   MEDICAL RECORD NO.:  [id number]                   PATIENT TYPE:  OIB   LOCATION:  2899                                 FACILITY:  [facility]   PHYSICIAN:  Dorothea Glassman, M.D.                 DATE OF BIRTH:  November 16, 1955   DATE OF PROCEDURE:  04/02/2004  DATE OF DISCHARGE:  04/02/2004                                 OPERATIVE REPORT   SURGEON:  Dorothea Glassman, M.D.   ASSISTANT:  Jacinta Shoe, P.A.   ANESTHESIA:  Local with MAC.   PREOPERATIVE DIAGNOSIS:  1. End stage renal disease.  2. Clotted left upper arm arteriovenous graft.   POSTOPERATIVE DIAGNOSIS:  1. End stage renal disease.  2. Clotted left upper arm arteriovenous graft.   PROCEDURE:  Thrombectomy left upper arm arteriovenous graft.   OPERATIVE PROCEDURE:  The patient was brought to the operating room in  stable condition.  She was placed in the supine position.  The left arm was  prepped and draped in a sterile fashion.  The skin and subcutaneous tissue  were instilled with 1% Xylocaine.  A longitudinal skin incision was made  through the scar in the left axilla.  Dissection was carried down through  the subcutaneous tissue.  The anastomosis to the axillary vein was  identified.  The graft was mobilized and encircled with a vessel loop.  The  graft was divided transversely.  A Fogarty was passed across the venous  anastomosis and returned without obstruction.  Good venous back bleeding was  obtained.  It was flushed with heparin saline solution and controlled with a  Gregory clamp.  The graft was thrombectomized several times with a #5  Fogarty catheter.  Excellent in flow was obtained.  The graft was filled  with heparin saline solution and controlled with a fistula clamp.  The graft  was then reanastomosed end-to-end using running 6-0 Prolene suture.  The  clamps were then removed.  Excellent flow present.  Adequate hemostasis was  obtained.   Sponge and instrument counts were correct.  The subcutaneous  tissue was closed with running 2-0 Vicryl suture.  The skin was closed with  4-0 Monocryl suture.  Steri-Strips were applied.  There were no apparent  complications.  The patient was transferred to the recovery room in stable  condition.                                               Dorothea Glassman, M.D.    PGH/MEDQ  D:  04/02/2004  T:  04/02/2004  Job:  [id number]

## 2011-02-05 NOTE — Op Note (Signed)
NAMEEDANA, Victoria [name]                 ACCOUNT NO.:  [id number]   MEDICAL RECORD NO.:  [id number]          PATIENT TYPE:  INP   LOCATION:  5511                         FACILITY:  [facility]   PHYSICIAN:  Judeth Cornfield. Scot Dock, M.D.DATE OF BIRTH:  01-07-1956   DATE OF PROCEDURE:  03/06/2005  DATE OF DISCHARGE:                                 OPERATIVE REPORT   PREOPERATIVE DIAGNOSIS:  Chronic renal failure.   POSTOPERATIVE DIAGNOSIS:  Chronic renal failure.   PROCEDURES:  Placement of left subclavian vein 28 cm Diatek catheter.   SURGEON:  Judeth Cornfield. Scot Dock, M.D.   ANESTHESIA:  Local with sedation.   TECHNIQUE:  The patient was taken to the operating room and sedated by  anesthesia. I used the ultrasound scanner to look at both IJs. I could not  identify the right IJ which is likely chronically occluded. She had  mentioned that she thought there was blockage in the right IJ. The left IJ  was very small. The neck and upper chest were prepped and draped in the  usual sterile fashion. After the skin was infiltrated with 1% lidocaine, I  attempted to cannulate the left IJ with a finder needle, but could never  cannulated. Therefore then persist, I elected to place a left subclavian  vein catheter. The skin was anesthetized and the left subclavian vein was  cannulated. A guidewire was introduced into the superior vena cava under  fluoroscopic control. The tract over the wire was dilated and then a dilator  and peel-away sheath were passed over the wire and wire and dilator removed.  The catheter was passed through the peel-away sheath and positioned in the  right atrium. The exit site for the catheter was selected and the skin  anesthetized between the two areas.  The catheter was then brought to the  tunnel, cut to the appropriate length and the distal ports were attached.  Both ports withdrew easily. We then flushed with heparinized saline and  filled with concentrated heparin. The  catheter was secured at its exit site  with a 3-0 nylon suture. The IJ cannulation site was closed with a 4-0  subcuticular stitch. A sterile dressing was applied. The patient tolerated  the procedure well and was transferred to the recovery room in satisfactory  condition. All needle and sponge counts were correct.       CSD/MEDQ  D:  03/06/2005  T:  03/06/2005  Job:  [id number]

## 2011-02-05 NOTE — Op Note (Signed)
NAME:  Victoria [name], Victoria [name]                           ACCOUNT NO.:  [id number]   MEDICAL RECORD NO.:  [id number]                   PATIENT TYPE:  OIB   LOCATION:  2856                                 FACILITY:  [facility]   PHYSICIAN:  Dorothea Glassman, M.D.                 DATE OF BIRTH:  08-19-1956   DATE OF PROCEDURE:  02/26/2004  DATE OF DISCHARGE:  02/26/2004                                 OPERATIVE REPORT   SURGEON:  Gordy Clement, M.D.   ASSISTANT:  Hoyle Sauer A. Zigmund Gottron.   ANESTHESIA:  Local with MAC.   PREOPERATIVE DIAGNOSES:  1. End-stage renal failure.  2. Clotted left upper arm arteriovenous graft.   POSTOPERATIVE DIAGNOSES:  1. End-stage renal failure.  2. Clotted left upper arm arteriovenous graft.   PROCEDURE:  1. Thrombectomy revision left upper arm arteriovenous graft.  2. Intraoperative sonogram.   CLINICAL NOTE:  Victoria [name] is a 55 year old black female with end-stage  renal failure.  She presented to hemodialysis today with a graft occlusion.  She is brought to the operating room at this time for thrombectomy revision.   OPERATIVE PROCEDURE:  The patient was brought to the operating room in  stable condition.  She was placed in the supine position.  The left arm was  prepped and draped in a sterile fashion.  Skin and subcutaneous tissue of  left axilla instilled with 1% Xylocaine with epinephrine.  A longitudinal  skin incision was made through the scar.  Dissection was carried down to  expose the venous anastomosis.  There was an end-to-end anastomosis to the  axillary vein.  The axillary vein was large in caliber. This was freed more  proximally in the axilla and controlled with a Gregory clamp.  The venous  anastomosis was taken down.  The graft was divided transversely across the  venous limb.  Several passes were made with a 4 Fogarty catheter obtaining a  large amount of thrombus with good arterial in flow.  The graft was filled  with heparin/saline  solution and controlled with a fistula clamp.   A new segment of 6-mm of Gore-Tex was then anastomosed end-to-end to the  divided graft using running 6-0 Prolene suture.  This was then brought down  to the vein and anastomosed end-to-end to the outflow axillary vein using a  running 6-0 Prolene suture.  The clamp was then removed.  Excellent flow was  present in the graft.  An intraoperative sonogram was then obtained.  This  revealed retained thrombus within the mid portion of the graft and also  degeneration of the graft.   A second skin incision was then made over the arterial anastomosis.  Dissection was carried down to expose the arterial anastomosis.  This was  freed and encircled with a Vesseloops.  The arterial limb of the graft was  divided transversely and controlled proximally with a fistula  clamp.  A  Fogarty was then passed along the course of the graft and retained thrombus  was removed.  Several passes were made with the Fogarty.  This was flushed  distally with heparin/saline solution.   The transverse graft incision was then closed with running 6-0 Prolene  suture.  Clamps were then removed.  Excellent flow was present.  Adequate  hemostasis was obtained.  Sponge and instrument counts were correct.   The incision was closed using running 3-0 Vicryl suture in the subcutaneous  layer, 4-0 Monocryl for the skin, and Steri-Strips applied over that.  Sterile dressing was applied.  No apparent complications.  The patient was  transferred to the recovery room in stable condition.                                               Dorothea Glassman, M.D.    PGH/MEDQ  D:  02/26/2004  T:  02/27/2004  Job:  [id number]

## 2011-02-05 NOTE — Op Note (Signed)
[facility]  Patient:    MESA, MEHRHOFF                        MRN: [id number] Proc. Date: 11/30/99 Adm. Date:  [id number] Disc. Date: [id number] Attending:  Manuella Ghazi                           Operative Report  PREOPERATIVE DIAGNOSIS:  End-stage renal disease with occluded left upper arm arteriovenous Gore-Tex graft.  POSTOPERATIVE DIAGNOSIS:  End-stage renal disease with occluded left upper arm arteriovenous Gore-Tex graft.  PROCEDURE:  Thrombectomy, interposition jump graft revision, venous anastomosis of left upper arm arteriovenous Gore-Tex graft.  SURGEON:  Rosetta Posner, M.D.  ASSISTANT:  Nurse.  ANESTHESIA:  Lidocaine 0.5% with epinephrine local and IV sedation.  COMPLICATIONS:  None.  DISPOSITION:  To recovery room stable.  PROCEDURE IN DETAIL:  The patient was taken to the operating room, placed in supine position, where the area of the left arm was prepped and draped in usual sterile fashion.  Incision was made over the prior venous anastomosis in the axilla and  carried down to isolate the graft.  The graft was opened transversely near the venous anastomosis.  The venous anastomosis was thrombectomized and there was a  moderate stenosis at the venous anastomosis.  The graft itself was thrombectomized and excellent inflow was obtained.  The graft was flushed with heparinized saline and reoccluded.  A new interposition graft of 7 mm Gore-Tex was brought onto the field.  Vein was slightly spatulated and the old venous anastomosis was excised. The graft was sewn end-to-end to the vein with a running 6-0 Prolene suture and  then end-to-end to the old graft with a running 6-0 Prolene suture.  Clamps were removed and excellent thrill was noted.  The wound was irrigated with saline, hemostasis achieved with electrocautery and wound was closed with 3-0 Vicryl in a subcutaneous tissue and subcuticular tissue.  Benzoin  and Steri-Strips were applied. DD:  11/30/99 TD:  11/30/99 Job: [id number]

## 2011-02-18 ENCOUNTER — Other Ambulatory Visit (INDEPENDENT_AMBULATORY_CARE_PROVIDER_SITE_OTHER): Payer: Self-pay | Admitting: Surgery

## 2011-02-18 ENCOUNTER — Inpatient Hospital Stay (HOSPITAL_COMMUNITY)
Admission: RE | Admit: 2011-02-18 | Discharge: 2011-02-21 | DRG: 674 | Disposition: A | Discharge status: Home / Self Care | Payer: Medicare Other | Source: Ambulatory Visit | Attending: Surgery | Admitting: Surgery

## 2011-02-18 DIAGNOSIS — E041 Nontoxic single thyroid nodule: Secondary | ICD-10-CM | POA: Diagnosis present

## 2011-02-18 DIAGNOSIS — I120 Hypertensive chronic kidney disease with stage 5 chronic kidney disease or end stage renal disease: Secondary | ICD-10-CM | POA: Diagnosis present

## 2011-02-18 DIAGNOSIS — Z9119 Patient's noncompliance with other medical treatment and regimen: Secondary | ICD-10-CM

## 2011-02-18 DIAGNOSIS — N186 End stage renal disease: Secondary | ICD-10-CM | POA: Diagnosis present

## 2011-02-18 DIAGNOSIS — N2581 Secondary hyperparathyroidism of renal origin: Principal | ICD-10-CM | POA: Diagnosis present

## 2011-02-18 DIAGNOSIS — Z91199 Patient's noncompliance with other medical treatment and regimen due to unspecified reason: Secondary | ICD-10-CM

## 2011-02-18 HISTORY — PX: OTHER SURGICAL HISTORY: SHX169

## 2011-02-18 LAB — CBC
HCT: 43.1 % (ref 36.0–46.0)
MCH: 27 pg (ref 26.0–34.0)
MCHC: 33.2 g/dL (ref 30.0–36.0)
MCV: 81.5 fL (ref 78.0–100.0)
Platelets: 242 10*3/uL (ref 150–400)
RDW: 15 % (ref 11.5–15.5)
WBC: 9.5 10*3/uL (ref 4.0–10.5)

## 2011-02-18 LAB — BASIC METABOLIC PANEL
BUN: 32 mg/dL — ABNORMAL HIGH (ref 6–23)
CO2: 31 mEq/L (ref 19–32)
Calcium: 9.6 mg/dL (ref 8.4–10.5)
Chloride: 97 mEq/L (ref 96–112)
Creatinine, Ser: 9.63 mg/dL — ABNORMAL HIGH (ref 0.4–1.2)
Glucose, Bld: 90 mg/dL (ref 70–99)

## 2011-02-18 LAB — DIFFERENTIAL
Eosinophils Absolute: 0.6 10*3/uL (ref 0.0–0.7)
Eosinophils Relative: 6 % — ABNORMAL HIGH (ref 0–5)
Lymphs Abs: 2.8 10*3/uL (ref 0.7–4.0)
Monocytes Absolute: 0.5 10*3/uL (ref 0.1–1.0)
Monocytes Relative: 5 % (ref 3–12)

## 2011-02-18 LAB — RENAL FUNCTION PANEL
BUN: 39 mg/dL — ABNORMAL HIGH (ref 6–23)
CO2: 28 mEq/L (ref 19–32)
Calcium: 8.8 mg/dL (ref 8.4–10.5)
Glucose, Bld: 156 mg/dL — ABNORMAL HIGH (ref 70–99)
Phosphorus: 7.1 mg/dL — ABNORMAL HIGH (ref 2.3–4.6)

## 2011-02-19 ENCOUNTER — Inpatient Hospital Stay (HOSPITAL_COMMUNITY): Payer: Medicare Other

## 2011-02-19 LAB — RENAL FUNCTION PANEL
Calcium: 7.7 mg/dL — ABNORMAL LOW (ref 8.4–10.5)
Chloride: 96 mEq/L (ref 96–112)
GFR calc non Af Amer: 3 mL/min — ABNORMAL LOW (ref >60)
Glucose, Bld: 102 mg/dL — ABNORMAL HIGH (ref 70–99)
Potassium: 4.8 mEq/L (ref 3.5–5.1)

## 2011-02-19 LAB — CBC
HCT: 40.1 % (ref 36.0–46.0)
MCH: 26.5 pg (ref 26.0–34.0)
MCV: 80.5 fL (ref 78.0–100.0)
Platelets: 246 10*3/uL (ref 150–400)
RDW: 14.8 % (ref 11.5–15.5)
WBC: 12.2 10*3/uL — ABNORMAL HIGH (ref 4.0–10.5)

## 2011-02-19 LAB — BASIC METABOLIC PANEL
Calcium: 9.5 mg/dL (ref 8.4–10.5)
Chloride: 97 mEq/L (ref 96–112)
Creatinine, Ser: 6.69 mg/dL — ABNORMAL HIGH (ref 0.4–1.2)
GFR calc Af Amer: 8 mL/min — ABNORMAL LOW (ref >60)
GFR calc non Af Amer: 6 mL/min — ABNORMAL LOW (ref >60)

## 2011-02-19 NOTE — Op Note (Signed)
Victoria [name], Victoria [name]                ACCOUNT NO.:  [id number]  MEDICAL RECORD NO.:  [id number]           PATIENT TYPE:  I  LOCATION:  [id number]                         FACILITY:  [facility]  PHYSICIAN:  Earnstine Regal, MD      DATE OF BIRTH:  [birth year]  DATE OF PROCEDURE:  02/18/2011                               OPERATIVE REPORT   PREOPERATIVE DIAGNOSES: 1. Secondary hyperparathyroidism. 2. End-stage renal disease. 3. Thyroid nodule.  POSTOPERATIVE DIAGNOSES: 1. Secondary hyperparathyroidism. 2. End-stage renal disease. 3. Thyroid nodule.  PROCEDURES: 1. Total parathyroidectomy. 2. Autotransplantation of parathyroid tissue to left brachioradialis muscle. 3. Thyroid isthmusectomy.  SURGEON:  Earnstine Regal, MD, FACS  ANESTHESIA:  General.  ESTIMATED BLOOD LOSS:  Minimal.  PREPARATION:  ChloraPrep.  COMPLICATIONS:  None.  INDICATIONS:  The patient is a 55 year old black female with end-stage renal disease secondary to hypertension.  She has failed Sensipar.  She has markedly elevated parathyroid hormone levels of 978.8 and a calcium x phosphorus product ranging from 53-91.  She now comes to the operating room for neck exploration, parathyroidectomy, and excision of a known thyroid nodule.  BODY OF REPORT:  Procedure was done in OR #16 at the [facility].  The patient was brought to the operating room, placed in supine position on the operating room table.  Following administration of general anesthesia, the patient was positioned and then prepped and draped in the usual strict aseptic fashion.  After ascertaining that an adequate level of anesthesia had been achieved, a Kocher incision was made with a #15 blade.  Dissection was carried through subcutaneous tissues and platysma and hemostasis obtained with the electrocautery.  Subplatysmal flaps were developed cephalad and caudad from the thyroid notch to the sternal notch.  A Mahorner self- retaining  retractor was placed for exposure.  Strap muscles were incised in the midline.  Dissection was begun on the left side.  Strap muscles were reflected laterally, exposing the left thyroid lobe.  Venous tributaries were divided between small and medium hemoclips with the electrocautery and the harmonic scalpel.  Left lobe was mobilized. Exploration posterior to the left lobe revealed a markedly enlarged left superior parathyroid gland.  This was gently dissected out.  Vascular tributaries were divided between small Ligaclips.  The entire gland was excised.  It measured approximately 2 cm in diameter.  A fragment of the gland was submitted to pathology where frozen section by Dr. Donato Heinz confirmed parathyroid tissue that was hyperplastic.  Remainder of the gland was placed in iced saline on the back table.  Further dissection on the left revealed a prominent thyrothymic tract. This was dissected out and excised in its entirety.  On palpation, there was approximately a 5-mm nodule of tissue which was quite firm.  This was marked with a suture and submitted to pathology where again frozen section biopsy confirmed parathyroid tissue.  Dry pack was placed in the left neck.  Next, we turned our attention to the right thyroid lobe.  This was mobilized from the strap muscles which were reflected laterally.  Venous tributaries were divided between small and  medium Ligaclips.  Right lobe was mobilized anteriorly.  Exploration revealed what appeared to be a relatively normal-sized and normal-textured parathyroid gland in the right inferior position.  It was mobilized and excised by dividing its vascular pedicle between Ligaclips.  A small fragment of the gland was excised and submitted as frozen section, confirming parathyroid tissue which appeared normal.  The remainder of the gland was placed in iced saline on the back table.  Further dissection on the right revealed an abnormal superior parathyroid  gland.  This was gently dissected out.  It was resected by dividing its vascular pedicle between small and medium Ligaclips.  It was excised in its entirety.  This was a large gland measuring approximately 2 cm in diameter.  A fragment was excised and submitted as frozen section, again confirming parathyroid tissue with hyperplasia. The remainder of the gland was placed in iced saline on the back table.  Neck was irrigated with warm saline.  Good hemostasis was noted bilaterally.  Surgicel was placed in the operative field bilaterally. Strap muscles were reapproximated in the midline with interrupted 3-0 Vicryl sutures.  Platysma was closed with interrupted 3-0 Vicryl sutures.  Skin was closed with a running 4-0 Monocryl subcuticular suture.  Wound was washed and dried and Benzoin and Steri-Strips were applied.  Sterile dressing was applied.  Next, the field was reset.  The left arm was placed, extended on an arm board.  The forearm was prepped and draped in the usual aseptic fashion. After ascertaining that an adequate level of anesthesia had been maintained, an incision was made over the left brachioradialis muscle. Dissection was carried through the subcutaneous tissues to the muscle fascia.  Subcutaneous planes were developed circumferentially and a Weitlaner retractor was placed for exposure.  The right inferior parathyroid gland was selected.  It was prepared into 1 mm fragments for implantation.  Each fragment was then utilized in the left forearm by incising the muscle fascia with a #15 blade, creating a muscular pocket, inserting a 1-mm fragment of parathyroid tissue, and closing the overlying muscle fascia with interrupted 4-0 Prolene sutures.  This exercise was repeated 9 times in the brachioradialis muscle.  Good hemostasis was noted.  Subcutaneous tissues were closed with interrupted 3-0 Vicryl sutures.  Skin was closed with a running 4-0 Monocryl subcuticular suture.   Wound was washed and dried and Benzoin and Steri- Strips were applied.  Sterile dressings were applied.  The patient was awakened from anesthesia and brought to the recovery room.  The patient tolerated the procedure well.   Earnstine Regal, MD, FACS     TMG/MEDQ  D:  02/18/2011  T:  02/19/2011  Job:  [id number]  cc:   Jeneen Rinks L. Deterding, M.D.  Electronically Signed by Armandina Gemma MD on 02/19/2011 09:10:28 AM

## 2011-02-21 LAB — RENAL FUNCTION PANEL
Albumin: 2.7 g/dL — ABNORMAL LOW (ref 3.5–5.2)
Chloride: 95 mEq/L — ABNORMAL LOW (ref 96–112)
GFR calc Af Amer: 4 mL/min — ABNORMAL LOW (ref >60)
GFR calc non Af Amer: 3 mL/min — ABNORMAL LOW (ref >60)
Potassium: 4.6 mEq/L (ref 3.5–5.1)
Sodium: 134 mEq/L — ABNORMAL LOW (ref 135–145)

## 2011-02-21 LAB — CBC
Platelets: 218 10*3/uL (ref 150–400)
RBC: 4.81 MIL/uL (ref 3.87–5.11)
WBC: 11.4 10*3/uL — ABNORMAL HIGH (ref 4.0–10.5)

## 2011-03-02 ENCOUNTER — Other Ambulatory Visit: Payer: Self-pay | Admitting: Nephrology

## 2011-03-02 DIAGNOSIS — Z1231 Encounter for screening mammogram for malignant neoplasm of breast: Secondary | ICD-10-CM

## 2011-03-04 ENCOUNTER — Ambulatory Visit
Admission: RE | Admit: 2011-03-04 | Discharge: 2011-03-04 | Disposition: A | Discharge status: Home / Self Care | Payer: Medicare Other | Source: Ambulatory Visit | Attending: Nephrology | Admitting: Nephrology

## 2011-03-04 DIAGNOSIS — Z1231 Encounter for screening mammogram for malignant neoplasm of breast: Secondary | ICD-10-CM

## 2011-03-05 NOTE — Consult Note (Signed)
Victoria [name], Victoria [name]                ACCOUNT NO.:  [id number]  MEDICAL RECORD NO.:  [id number]           PATIENT TYPE:  I  LOCATION:  [id number]                         FACILITY:  [facility]  PHYSICIAN:  Donato Heinz, M.D.DATE OF BIRTH:  [birth year]  DATE OF CONSULTATION:  02/18/2011 DATE OF DISCHARGE:                                CONSULTATION   CONSULTING PHYSICIAN:  Earnstine Regal, MD  REASON FOR CONSULTATION:  Electrolyte abnormalities post parathyroidectomy.  HISTORY OF PRESENT ILLNESS:  Ms. Brutus is a 55 year old African American female with past medical history significant for end-stage renal disease secondary to hypertensive nephrosclerosis, severe secondary hyperthyroidism poor controlled due to poor adherence to phosphate binders who was admitted for parathyroidectomy.  We have been asked to help further evaluate and manage her electrolyte abnormalities as well as her medical issues following parathyroidectomy.  Her allergies are to [facility] which causes edema, LISINOPRIL, which causes nausea and vomiting, ANCEF which causes severe reaction and rash.  PAST MEDICAL HISTORY: 1. End-stage renal disease secondary to hypertensive nephrosclerosis     started on dialysis in 1993. 2. Severe secondary hyperparathyroidism. 3. Hypocalcemia due to noncompliance with phosphate binders. 4. Anemia of chronic disease. 5. Goiter. 6. History of MRSA in 2003. 7. Carpal tunnel syndrome. 8. Negative Cardiolite. 9. History of displaced bimalleolar right ankle fracture status post     open reduction internal fixation 2009. 10.Severe allergic reaction, Ancef which was given preop for     parathyroidectomy and clotted AV graft requiring thrombectomy on     Jan 26, 2011, surgery was cancelled and rescheduled.  OUTPATIENT MEDICATIONS:  She is not taking any of her's that were prescribed.  She had been prescribed: 1. Norvasc 5 a day. 2. Sensipar 90 a day. 3. Tums Ultra four with meals 3 times a  day. 4. Nephro-Vite one a day. 5. Albuterol inhalers, again she is not taking any of these.  FAMILY HISTORY:  Noncontributory.  SOCIAL HISTORY:  She is married for second time.  Denies tobacco, alcohol, or drug use.  Lives at home with her husband.  REVIEW OF SYSTEMS:  GENERAL:  She denies any anorexia or malaise. OPHTHALMIC:  No blurred vision, photophobia.  ENT:  No tinnitus, dysphagia, odynophagia.  CARDIAC:  No chest pain, palpitations, orthopnea, PND.  PULMONARY:  No shortness of breath, hemoptysis, productive cough.  GI:  No nausea, vomiting, hematochezia, melena, or bright red blood per rectum.  GU:  No dysuria, pyuria, hematuria, urgency, frequency, or retention.  NEUROLOGIC:  No arthralgias or myalgias.  DERMATOLOGIC:  No rashes, lumps, or bumps.  All other systems negative.  PHYSICAL EXAMINATION:  GENERAL:  This is a well-developed, well- nourished female who is lethargic, but arousable in no apparent distress. VITAL SIGNS:  Temperature 98, pulse 69, blood pressure 172/95, respiratory rate is 20. HEENT:  Head normocephalic, atraumatic.  Extraocular muscles intact.  No icterus.  Oropharynx without lesions. NECK:  She is status post incision and packing is in place. LUNGS:  Clear to auscultation and percussion bilaterally.  No rales or rhonchi. CARDIAC:  Regular rate and rhythm.  No precordial rub appreciated. ABDOMEN:  Normoactive bowel sounds, soft, nontender, nondistended.  No guarding, no rebound, no bruits. EXTREMITIES:  No clubbing, cyanosis, or edema.  She has multiple failed grafts in the right forearm, AV graft is palpable thrill and audible bruit.  PREOPERATIVE LABORATORY DATA:  Her sodium was 140, potassium 3.9, chloride 97, CO2 of 31, BUN 32, creatinine 9.69, glucose 90, calcium 9.6.  CBC significant for hemoglobin of 14.3, white blood cell count 9.5, platelets 242, her INR of 0.9.  ASSESSMENT AND PLAN: 1. Secondary hyperparathyroidism, severe and poorly  controlled due to     noncompliance status post parathyroidectomy without a re-     implantation, left forearm.  We will start her on Tums 4 with each     meal and at bedtime follow her calcium and phosphorus levels, was     started on vitamin D with dialysis and  had calcium bath.  We will     discontinue Sensipar. 2. Hypertension.  Blood pressure is elevated.  We will resume her     outpatient Norvasc 5 mg a day and follow. 3. End-stage renal disease secondary to hypertension.  Continue with     dialysis every Monday, Wednesday, and Friday, will plan for     dialysis again tomorrow without heparin given her surgery. 4. Medical noncompliance.  Again stressed the importance of calcium     intake post parathyroidectomy to prevent hypocalcemia as     complications. 5. Vascular access.  She recently had a declot after reaction Ancef is     patent and we will plan for use tomorrow.  Thank you for this consultation.  We will continue to follow along.          ______________________________ Donato Heinz, M.D.     JC/MEDQ  D:  02/18/2011  T:  02/19/2011  Job:  [id number]  Electronically Signed by Donato Heinz M.D. on 03/05/2011 12:51:50 PM

## 2011-03-10 ENCOUNTER — Encounter (INDEPENDENT_AMBULATORY_CARE_PROVIDER_SITE_OTHER): Payer: Self-pay | Admitting: Surgery

## 2011-03-10 DIAGNOSIS — I953 Hypotension of hemodialysis: Secondary | ICD-10-CM | POA: Insufficient documentation

## 2011-03-10 DIAGNOSIS — I10 Essential (primary) hypertension: Secondary | ICD-10-CM | POA: Insufficient documentation

## 2011-03-10 DIAGNOSIS — N289 Disorder of kidney and ureter, unspecified: Secondary | ICD-10-CM | POA: Insufficient documentation

## 2011-03-22 ENCOUNTER — Encounter (INDEPENDENT_AMBULATORY_CARE_PROVIDER_SITE_OTHER): Payer: Self-pay | Admitting: Surgery

## 2011-03-22 ENCOUNTER — Ambulatory Visit (INDEPENDENT_AMBULATORY_CARE_PROVIDER_SITE_OTHER): Payer: Medicare Other | Admitting: Surgery

## 2011-03-22 DIAGNOSIS — N2581 Secondary hyperparathyroidism of renal origin: Secondary | ICD-10-CM

## 2011-03-22 DIAGNOSIS — E041 Nontoxic single thyroid nodule: Secondary | ICD-10-CM | POA: Insufficient documentation

## 2011-03-22 NOTE — Progress Notes (Signed)
HISTORY: The patient is a 55 year old black female. She returns having undergone neck exploration, total parathyroidectomy, and thyroid isthmusectomy on Feb 18, 2011. Final pathologic results are all benign.  Laboratory studies from her nephrologist show a normal calcium of 8.3 and a markedly improved parathyroid hormone level of 90.4 as of March 17, 2011.   PERTINENT REVIEW OF SYSTEMS: Patient states she has markedly improved energy levels, normal voice, no dysphagia.   EXAM: Neck shows a well-healed surgical wound. Minimal soft tissue swelling. No palpable masses. No seroma. No sign of infection. First quality is normal.  Left forearm wound is well-healed without evidence of seroma or infection.   IMPRESSION: Status post total parathyroidectomy with autotransplantation to left forearm. Normalization of serum calcium level and near normal level of parathyroid hormone level. Patient also underwent concurrent resection of the thyroid isthmus for a benign thyroid nodule.  Patient will continue close followup with her nephrologist. She will return to see me as needed.   PLAN:

## 2011-03-30 NOTE — Discharge Summary (Signed)
  NAMETUESDAE, SENTMAN                ACCOUNT NO.:  [id number]  MEDICAL RECORD NO.:  [id number]  LOCATION:  [id number]                         FACILITY:  [facility]  PHYSICIAN:  Earnstine Regal, MD      DATE OF BIRTH:  Mar 07, 1956  DATE OF ADMISSION:  02/18/2011 DATE OF DISCHARGE:  02/21/2011                              DISCHARGE SUMMARY   REASON FOR ADMISSION:  End-stage renal disease, secondary hyperparathyroidism, thyroid nodule.  BRIEF HISTORY:  The patient is a 55 year old black female with end-stage renal disease due to hypertension.  She has failed management with Sensipar.  She has an elevated parathyroid hormone level of 978.8 and a calcium times phosphorus product which ranges from 53-91.  She also has a known thyroid nodule in the thyroid isthmus.  She now comes to surgery for parathyroidectomy, autotransplantation, and thyroid isthmusectomy  HOSPITAL COURSE:  The patient was admitted on the surgical service and taken to the operating room.  She underwent total parathyroidectomy with autotransplantation of parathyroid tissue to the left brachioradialis muscle.  The patient also had concurrent thyroid isthmusectomy. Postoperatively, she was cared for in conjunction with the nephrology service.  She remained hemodynamically stable.  She had a marked decrease in her serum calcium levels.  She underwent hemodialysis without complication.  The patient was prepared for discharge home on February 21, 2011.  DISCHARGE PLANNING:  The patient is discharged to home on February 21, 2011, in good condition, tolerating a renal diet, and ambulating independently.  Discharge medications are unchanged from preoperative.  The patient will be seen back in my office at [facility] in 2-3 weeks for wound check.  FINAL DIAGNOSIS:  Secondary hyperparathyroidism, end-stage renal disease, thyroid nodule.  Final pathologic results pending at the time of discharge.  Condition at discharge is  good.     Earnstine Regal, MD     TMG/MEDQ  D:  03/12/2011  T:  03/13/2011  Job:  [id number]  Electronically Signed by Armandina Gemma MD on 03/30/2011 10:53:26 AM

## 2011-04-28 ENCOUNTER — Other Ambulatory Visit (HOSPITAL_COMMUNITY): Payer: Self-pay | Admitting: Nephrology

## 2011-04-28 DIAGNOSIS — N186 End stage renal disease: Secondary | ICD-10-CM

## 2011-05-04 ENCOUNTER — Ambulatory Visit (HOSPITAL_COMMUNITY)
Admission: RE | Admit: 2011-05-04 | Discharge: 2011-05-04 | Disposition: A | Discharge status: Home / Self Care | Payer: Medicare Other | Source: Ambulatory Visit | Attending: Nephrology | Admitting: Nephrology

## 2011-05-04 ENCOUNTER — Other Ambulatory Visit (HOSPITAL_COMMUNITY): Payer: Self-pay | Admitting: Nephrology

## 2011-05-04 DIAGNOSIS — T82898A Other specified complication of vascular prosthetic devices, implants and grafts, initial encounter: Secondary | ICD-10-CM | POA: Insufficient documentation

## 2011-05-04 DIAGNOSIS — N186 End stage renal disease: Secondary | ICD-10-CM

## 2011-05-04 DIAGNOSIS — Y832 Surgical operation with anastomosis, bypass or graft as the cause of abnormal reaction of the patient, or of later complication, without mention of misadventure at the time of the procedure: Secondary | ICD-10-CM | POA: Insufficient documentation

## 2011-05-04 MED ORDER — IOHEXOL 300 MG/ML  SOLN
100.0000 mL | Freq: Once | INTRAMUSCULAR | Status: AC | PRN
  Administered 2011-05-04: 30 mL via INTRAVENOUS

## 2011-06-11 LAB — I-STAT 8, (EC8 V) (CONVERTED LAB)
Acid-Base Excess: 3 — ABNORMAL HIGH
BUN: 41 — ABNORMAL HIGH
Bicarbonate: 30 — ABNORMAL HIGH
Chloride: 102
HCT: 51 — ABNORMAL HIGH
Hemoglobin: 17.3 — ABNORMAL HIGH
Operator id: 285491
Sodium: 135

## 2011-06-11 LAB — CBC
Platelets: 275
RBC: 5.5 — ABNORMAL HIGH
WBC: 20.1 — ABNORMAL HIGH

## 2011-06-23 LAB — RENAL FUNCTION PANEL
BUN: 29 — ABNORMAL HIGH
BUN: 50 — ABNORMAL HIGH
BUN: 51 — ABNORMAL HIGH
CO2: 24
CO2: 24
CO2: 28
Calcium: 10.4
Calcium: 9
Calcium: 9.5
Chloride: 95 — ABNORMAL LOW
Creatinine, Ser: 12.74 — ABNORMAL HIGH
Creatinine, Ser: 13.27 — ABNORMAL HIGH
GFR calc Af Amer: 4 — ABNORMAL LOW
GFR calc non Af Amer: 3 — ABNORMAL LOW
Glucose, Bld: 78
Glucose, Bld: 94
Phosphorus: 4.6
Sodium: 138

## 2011-06-23 LAB — BASIC METABOLIC PANEL
Calcium: 9
Creatinine, Ser: 12.67 — ABNORMAL HIGH
GFR calc Af Amer: 4 — ABNORMAL LOW
GFR calc non Af Amer: 3 — ABNORMAL LOW
Glucose, Bld: 90
Sodium: 140

## 2011-06-23 LAB — CBC
HCT: 31.8 — ABNORMAL LOW
HCT: 33.6 — ABNORMAL LOW
HCT: 33.7 — ABNORMAL LOW
HCT: 33.9 — ABNORMAL LOW
HCT: 36.8
Hemoglobin: 10.8 — ABNORMAL LOW
Hemoglobin: 10.9 — ABNORMAL LOW
Hemoglobin: 11.5 — ABNORMAL LOW
Hemoglobin: 12
MCHC: 31.9
MCHC: 32.6
MCHC: 33.1
MCV: 82.2
MCV: 83.7
MCV: 84.4
Platelets: 257
Platelets: 322
Platelets: 346
RBC: 3.8 — ABNORMAL LOW
RBC: 3.99
RBC: 4.01
RBC: 4.06
RDW: 14.8
WBC: 10.8 — ABNORMAL HIGH
WBC: 11.4 — ABNORMAL HIGH
WBC: 13.4 — ABNORMAL HIGH

## 2011-06-23 LAB — DIFFERENTIAL
Eosinophils Absolute: 0.9 — ABNORMAL HIGH
Eosinophils Relative: 8 — ABNORMAL HIGH
Lymphocytes Relative: 31
Lymphs Abs: 3.5
Monocytes Absolute: 0.6
Monocytes Relative: 6

## 2011-06-23 LAB — TYPE AND SCREEN
ABO/RH(D): O POS
Antibody Screen: NEGATIVE

## 2011-06-23 LAB — HEPATIC FUNCTION PANEL
Alkaline Phosphatase: 49
Total Protein: 6.3

## 2011-06-23 LAB — ABO/RH: ABO/RH(D): O POS

## 2011-06-23 LAB — POCT I-STAT 4, (NA,K, GLUC, HGB,HCT): HCT: 36

## 2011-10-12 ENCOUNTER — Other Ambulatory Visit (HOSPITAL_COMMUNITY): Payer: Self-pay | Admitting: Nephrology

## 2011-10-12 DIAGNOSIS — N186 End stage renal disease: Secondary | ICD-10-CM

## 2011-10-14 ENCOUNTER — Encounter (HOSPITAL_COMMUNITY): Payer: Self-pay | Admitting: Pharmacy Technician

## 2011-10-19 ENCOUNTER — Ambulatory Visit (HOSPITAL_COMMUNITY)
Admission: RE | Admit: 2011-10-19 | Discharge: 2011-10-19 | Disposition: A | Discharge status: Home / Self Care | Payer: Medicare Other | Source: Ambulatory Visit | Attending: Nephrology | Admitting: Nephrology

## 2011-10-19 ENCOUNTER — Other Ambulatory Visit (HOSPITAL_COMMUNITY): Payer: Self-pay | Admitting: Nephrology

## 2011-10-19 DIAGNOSIS — N186 End stage renal disease: Secondary | ICD-10-CM

## 2011-10-19 DIAGNOSIS — Y832 Surgical operation with anastomosis, bypass or graft as the cause of abnormal reaction of the patient, or of later complication, without mention of misadventure at the time of the procedure: Secondary | ICD-10-CM | POA: Insufficient documentation

## 2011-10-19 DIAGNOSIS — I871 Compression of vein: Secondary | ICD-10-CM | POA: Insufficient documentation

## 2011-10-19 DIAGNOSIS — T82898A Other specified complication of vascular prosthetic devices, implants and grafts, initial encounter: Secondary | ICD-10-CM | POA: Insufficient documentation

## 2011-10-19 NOTE — Procedures (Signed)
Shuntogram 34mm PTA venous No complication No blood loss. See complete dictation in [facility].

## 2011-10-21 ENCOUNTER — Telehealth (HOSPITAL_COMMUNITY): Payer: Self-pay

## 2012-07-10 DIAGNOSIS — E209 Hypoparathyroidism, unspecified: Secondary | ICD-10-CM | POA: Insufficient documentation

## 2012-08-11 ENCOUNTER — Ambulatory Visit
Admission: RE | Admit: 2012-08-11 | Discharge: 2012-08-11 | Disposition: A | Discharge status: Home / Self Care | Payer: Medicare Other | Source: Ambulatory Visit | Attending: Nephrology | Admitting: Nephrology

## 2012-08-11 ENCOUNTER — Other Ambulatory Visit: Payer: Self-pay | Admitting: Nephrology

## 2012-08-11 DIAGNOSIS — T1490XA Injury, unspecified, initial encounter: Secondary | ICD-10-CM

## 2012-08-23 ENCOUNTER — Emergency Department (HOSPITAL_COMMUNITY): Payer: Medicare Other

## 2012-08-23 ENCOUNTER — Observation Stay (HOSPITAL_COMMUNITY)
Admission: EM | Admit: 2012-08-23 | Discharge: 2012-08-24 | Disposition: A | Discharge status: Home / Self Care | Payer: Medicare Other | Attending: Cardiovascular Disease | Admitting: Cardiovascular Disease

## 2012-08-23 ENCOUNTER — Encounter (HOSPITAL_COMMUNITY): Payer: Self-pay | Admitting: Emergency Medicine

## 2012-08-23 DIAGNOSIS — N186 End stage renal disease: Secondary | ICD-10-CM | POA: Insufficient documentation

## 2012-08-23 DIAGNOSIS — R0789 Other chest pain: Principal | ICD-10-CM | POA: Diagnosis present

## 2012-08-23 DIAGNOSIS — I120 Hypertensive chronic kidney disease with stage 5 chronic kidney disease or end stage renal disease: Secondary | ICD-10-CM | POA: Insufficient documentation

## 2012-08-23 DIAGNOSIS — R072 Precordial pain: Secondary | ICD-10-CM

## 2012-08-23 DIAGNOSIS — D649 Anemia, unspecified: Secondary | ICD-10-CM | POA: Insufficient documentation

## 2012-08-23 DIAGNOSIS — Z992 Dependence on renal dialysis: Secondary | ICD-10-CM | POA: Insufficient documentation

## 2012-08-23 HISTORY — DX: Other complications of anesthesia, initial encounter: T88.59XA

## 2012-08-23 HISTORY — DX: Other seasonal allergic rhinitis: J30.2

## 2012-08-23 HISTORY — DX: Unspecified chronic bronchitis: J42

## 2012-08-23 HISTORY — DX: Adverse effect of unspecified anesthetic, initial encounter: T41.45XA

## 2012-08-23 LAB — BASIC METABOLIC PANEL
CO2: 29 mEq/L (ref 19–32)
Calcium: 9.7 mg/dL (ref 8.4–10.5)
Chloride: 99 mEq/L (ref 96–112)
Sodium: 139 mEq/L (ref 135–145)

## 2012-08-23 LAB — CK TOTAL AND CKMB (NOT AT ARMC)
CK, MB: 3 ng/mL (ref 0.3–4.0)
Total CK: 127 U/L (ref 7–177)

## 2012-08-23 LAB — CBC
MCH: 27.1 pg (ref 26.0–34.0)
Platelets: 191 10*3/uL (ref 150–400)
RBC: 4.8 MIL/uL (ref 3.87–5.11)
WBC: 7.1 10*3/uL (ref 4.0–10.5)

## 2012-08-23 LAB — TROPONIN I: Troponin I: <0.3 ng/mL (ref <0.30)

## 2012-08-23 MED ORDER — NITROGLYCERIN 0.4 MG SL SUBL: 0.4000 mg | SUBLINGUAL_TABLET | SUBLINGUAL | Status: DC | PRN

## 2012-08-23 MED ORDER — SODIUM CHLORIDE 0.9 % IV SOLN: 250.0000 mL | INTRAVENOUS | Status: DC | PRN

## 2012-08-23 MED ORDER — PARICALCITOL 5 MCG/ML IV SOLN
INTRAVENOUS | Status: AC
  Administered 2012-08-23: 1 ug via INTRAVENOUS
  Filled 2012-08-23: qty 1

## 2012-08-23 MED ORDER — ENOXAPARIN SODIUM 30 MG/0.3ML ~~LOC~~ SOLN
30.0000 mg | SUBCUTANEOUS | Status: DC
  Administered 2012-08-23: 30 mg via SUBCUTANEOUS
  Filled 2012-08-23 (×2): qty 0.3

## 2012-08-23 MED ORDER — RENA-VITE PO TABS
1.0000 | ORAL_TABLET | Freq: Every day | ORAL | Status: DC
  Administered 2012-08-23 – 2012-08-24 (×2): 1 via ORAL
  Filled 2012-08-23 (×2): qty 1

## 2012-08-23 MED ORDER — CALCIUM CARBONATE ANTACID 500 MG PO CHEW
1.0000 | CHEWABLE_TABLET | Freq: Every day | ORAL | Status: DC
  Administered 2012-08-23 – 2012-08-24 (×2): 200 mg via ORAL
  Filled 2012-08-23 (×2): qty 1

## 2012-08-23 MED ORDER — ZOLPIDEM TARTRATE 5 MG PO TABS: 5.0000 mg | ORAL_TABLET | Freq: Every evening | ORAL | Status: DC | PRN

## 2012-08-23 MED ORDER — PARICALCITOL 5 MCG/ML IV SOLN
1.0000 ug | INTRAVENOUS | Status: DC
  Administered 2012-08-23: 1 ug via INTRAVENOUS

## 2012-08-23 MED ORDER — SODIUM CHLORIDE 0.9 % IJ SOLN: 3.0000 mL | INTRAMUSCULAR | Status: DC | PRN

## 2012-08-23 MED ORDER — ONDANSETRON HCL 4 MG/2ML IJ SOLN: 4.0000 mg | Freq: Four times a day (QID) | INTRAMUSCULAR | Status: DC | PRN

## 2012-08-23 MED ORDER — ALPRAZOLAM 0.25 MG PO TABS: 0.2500 mg | ORAL_TABLET | Freq: Two times a day (BID) | ORAL | Status: DC | PRN

## 2012-08-23 MED ORDER — ACETAMINOPHEN 325 MG PO TABS: 650.0000 mg | ORAL_TABLET | ORAL | Status: DC | PRN

## 2012-08-23 MED ORDER — ASPIRIN EC 81 MG PO TBEC
81.0000 mg | DELAYED_RELEASE_TABLET | Freq: Every day | ORAL | Status: DC
  Administered 2012-08-24: 81 mg via ORAL
  Filled 2012-08-23: qty 1

## 2012-08-23 MED ORDER — SODIUM CHLORIDE 0.9 % IJ SOLN
3.0000 mL | Freq: Two times a day (BID) | INTRAMUSCULAR | Status: DC
  Administered 2012-08-23: 3 mL via INTRAVENOUS

## 2012-08-23 NOTE — ED Notes (Signed)
Pt resting in stretcher in NAD, respirations even and unlabored.  Pt aware of plan for Cardiology consult.  Pt continues to deny CP at this time.  Call bell within reach.  Provided extra blanket, dimmed lights and assisted pt positioning in stretcher.

## 2012-08-23 NOTE — ED Notes (Signed)
To ED from dialysis via EMS, pt had central CP with dialysis which was relieved when her treatment was stopped, no pain on arrival, VSS, CBG 115, also sts cough X2w, NAD

## 2012-08-23 NOTE — Procedures (Signed)
I was present at this dialysis session. I have reviewed the session itself and made appropriate changes.   Kelly Splinter, MD [employer] 08/23/2012, 5:49 PM

## 2012-08-23 NOTE — H&P (Signed)
History and Physical   Patient ID: Victoria [name] MRN: [id number], DOB/AGE: 11-13-1955 56 y.o. Date of Encounter: 08/23/2012  Primary Physician: Placido Sou, MD Primary Cardiologist: None  Chief Complaint:  Chest pain  HPI: Ms. Victoria [name] is a 56 year old female with no previous cardiac history. She has been on dialysis for 21 years. Prior to this session, she had gained a little more weight than usual but was otherwise doing well. After being on dialysis for about 45 minutes, she developed substernal chest pain she describes as a "pinch" and a 4/10. She told the nursing staff about it and they took her off dialysis. When they took her off dialysis the pain resolved. They then put her back on dialysis and she developed a pinching pain again. The pain did not change with deep inspiration or cough. Total duration of the pain was less than 30 minutes for both episodes. After the second occurrence, they took her off dialysis, the pain resolved and she was sent to the emergency room. In the emergency room she is still a little short of breath and feels congested because she did not complete dialysis but is otherwise resting comfortably. She has never had this pain before. She has no history of exertional chest pain and has chronic exertional dyspnea, worse right before dialysis but this has not changed recently. Of note she has recent dietary indiscretions because of the holidays.  Past Medical History  Diagnosis Date  . Kidney failure     HD since approx 1992  . Hypertension     Now controlled by HD    Surgical History:  Past Surgical History  Procedure Date  . Confirm procedure     Patient had "draf in arm" on history form.Previous HD graft in left arm  . Dg av dialysis shunt access exist*r* or   . Total abdominal hysterectomy 2007  . Parathyroidectomy 2012    With implant to left forearm  . Thyroidectomy, partial 2012    Isthmusectomy      I have reviewed the patient's current  medications. Medication Sig  calcium carbonate (TUMS - DOSED IN MG ELEMENTAL CALCIUM) 500 MG chewable tablet Chew 1 tablet by mouth daily as needed. For indigestion.   Allergies:  Allergies  Allergen Reactions  . Lisinopril Swelling  . Norvasc (Amlodipine Besylate) Swelling   History   Social History  . Marital Status: Married    Spouse Name: N/A    Number of Children: N/A  . Years of Education: N/A   Occupational History  . Not on file.   Social History Main Topics  . Smoking status: Never Smoker   . Smokeless tobacco: Not on file  . Alcohol Use: No     Comment: quit 2007  . Drug Use: No  . Sexually Active: Not on file   Other Topics Concern  . Not on file   Social History Narrative  . No narrative on file     Family History  Problem Relation Age of Onset  . Hypertension Father   . Thyroid disease Father   . Hypertension Sister   . Thyroid disease Sister    Family Status  Relation Status Death Age  . Mother Deceased   . Father Deceased   . Sister Alive   . Brother Alive     Review of Systems: Occasional musculoskeletal aches and pains. No recent illnesses, fevers or chills. She has not had URI symptoms. No melena Full 14-point review of systems otherwise negative except  as noted above.  Physical Exam: Blood pressure 118/82, pulse 84, temperature 97.8 F (36.6 C), temperature source Oral, resp. rate 27, height 5\' 2"  (1.575 m), weight 202 lb (91.627 kg), SpO2 100.00%. General: Well developed, well nourished, female in no acute distress. Head: Normocephalic, atraumatic, sclera non-icteric, no xanthomas, nares are without discharge. Dentition: good Neck: No carotid bruits. JVD at 8 cm. No thyromegally Lungs: Good expansion bilaterally. without wheezes or rhonchi. Rales bilateral bases, right > left Heart: Regular rate and rhythm with S1 S2.  No S3 or S4.  No murmur, no rubs, or gallops appreciated. Abdomen: Soft, non-tender, non-distended with normoactive  bowel sounds. No hepatomegaly. No rebound/guarding. No obvious abdominal masses. Msk:  Strength and tone appear normal for age. No joint deformities or effusions, no spine or costo-vertebral angle tenderness. Extremities: No clubbing or cyanosis. No edema.  Distal pedal pulses are 2+ in 4 extrem Neuro: Alert and oriented X 3. Moves all extremities spontaneously. No focal deficits noted. Psych:  Responds to questions appropriately with a normal affect. Skin: No rashes or lesions noted  Labs:  Lab Results  Component Value Date   WBC 7.1 08/23/2012   HGB 13.0 08/23/2012   HCT 39.4 08/23/2012   MCV 82.1 08/23/2012   PLT 191 08/23/2012   No results found for this basename: INR in the last 72 hours   Lab 08/23/12 0821  NA 139  K 3.5  CL 99  CO2 29  BUN 35*  CREATININE 7.79*  CALCIUM 9.7  PROT --  BILITOT --  ALKPHOS --  ALT --  AST --  GLUCOSE 92    Basename 08/23/12 0823  CKTOTAL --  CKMB --  TROPONINI <0.30   Radiology/Studies:  Dg Chest 2 View 08/23/2012  *RADIOLOGY REPORT*  Clinical Data: Chest pain shortness of breath on dialysis. Nonproductive cough for 2 weeks.  CHEST - 2 VIEW  Comparison: Chest radiograph 11/21/2010  Findings: Stable mild cardiomegaly.  Stable tortuous thoracic aorta contour. There is a faint small pleural-based opacity at the right lung base, best seen on the lateral view, which is not present on prior chest radiograph of March 2012.  The remainder the right lung field is clear.  The left lung is clear. Surgical clips in the thoracic inlet on the left. No acute bony abnormality identified.  IMPRESSION: Small pleural-based opacity right lung base laterally.  Small/early focus of pneumonia cannot be excluded.  Focal pleural thickening with atelectasis could have a similar appearance.   Original Report Authenticated By: Curlene Dolphin, M.D.     Echo: 03/11/2006 SUMMARY - Overall left ventricular systolic function was at the lower limits of normal. akinesis of  the basal inferior wall. Left ventricular wall thickness was mildly increased. - There was mild fibrocalcific change of the aortic root. There was mild ascending aortic dilatation. - There was mild mitral annular calcification.  ECG:  23-Aug-2012 08:26:46 SINUS RHYTHM ~ normal P axis, V-rate 50- 99 ABERRANT COMPLEX, POSSIBLY SUPRAVENTRICULAR ~ aberrant shape, PR 80-220 FIRST DEGREE AV BLOCK ~ PR >210, V-rate 50- 90 LAD, CONSIDER LAFB OR INFERIOR INFARCT ~ axis(240,-30), Q&R II III aVF EARLY PRECORDIAL R/S TRANSITION ~ QRS area positive in V2 CONSIDER ANTERIOR INFARCT ~ diminished R <0.74mV V3 Vent. rate 78 BPM PR interval 216 ms QRS duration 82 ms QT/QTc 416/474 ms P-R-T axes 50 -52 59  ASSESSMENT AND PLAN:  Principal Problem:  *Chest pain, mid sternal - admit, r/o MI. MV in am.  Signed,  Rosaria Ferries PA-C  08/23/2012, 12:22 PM  I have personally seen and examined this patient with Rosaria Ferries, PA-C. I agree with the assessment and plan as outlined above. Chest pain during dialysis. This is new for her. No objective evidence of ischemia. EKG without ischemic changes. Will admit to rule out with serial cardiac markers. OK for dialysis tonight. Will alert renal. Plan stress myoview in am.   Emrah Ariola 1:49 PM 08/23/2012

## 2012-08-23 NOTE — ED Provider Notes (Signed)
History     CSN: [id number]  Arrival date & time 08/23/12  [id number]   First MD Initiated Contact with Patient 08/23/12 0818      Chief Complaint  Patient presents with  . Chest Pain    (Consider location/radiation/quality/duration/timing/severity/associated sxs/prior treatment) Patient is a 56 y.o. female presenting with chest pain. The history is provided by the patient.  Chest Pain    patient here with substernal chest pain that began when she was having dialysis today. States the pain is worse when she coughs and when she had dialysis. Pain improved after dialysis was stopped. No associated dyspnea or diaphoresis. No recent fever or chills. No vomiting or abdominal pain. No prior history of same. Called EMS and was transported here  Past Medical History  Diagnosis Date  . Kidney failure   . Hypertension     Past Surgical History  Procedure Date  . Confirm procedure     Patient had "draf in arm" on history form.  . Parathyroid implant removal     left forearm 2012  . Dg av dialysis shunt access exist*r* or     Family History  Problem Relation Age of Onset  . Hypertension Father   . Thyroid disease Father   . Hypertension Sister   . Thyroid disease Sister     History  Substance Use Topics  . Smoking status: Never Smoker   . Smokeless tobacco: Not on file  . Alcohol Use: No     Comment: quit 2007    OB History    Grav Para Term Preterm Abortions TAB SAB Ect Mult Living                  Review of Systems  Cardiovascular: Positive for chest pain.  All other systems reviewed and are negative.    Allergies  Lisinopril and Norvasc  Home Medications   Current Outpatient Rx  Name  Route  Sig  Dispense  Refill  . CALCIUM CARBONATE ANTACID 500 MG PO CHEW   Oral   Chew 1 tablet by mouth daily as needed. For indigestion.           BP 127/70  Pulse 82  Resp 18  Ht 5\' 2"  (1.575 m)  Wt 202 lb (91.627 kg)  BMI 36.95 kg/m2  SpO2 99%  Physical Exam   Nursing note and vitals reviewed. Constitutional: She is oriented to person, place, and time. She appears well-developed and well-nourished.  Non-toxic appearance. No distress.  HENT:  Head: Normocephalic and atraumatic.  Eyes: Conjunctivae normal, EOM and lids are normal. Pupils are equal, round, and reactive to light.  Neck: Normal range of motion. Neck supple. No tracheal deviation present. No mass present.  Cardiovascular: Normal rate, regular rhythm and normal heart sounds.  Exam reveals no gallop.   No murmur heard. Pulmonary/Chest: Effort normal and breath sounds normal. No stridor. No respiratory distress. She has no decreased breath sounds. She has no wheezes. She has no rhonchi. She has no rales.  Abdominal: Soft. Normal appearance and bowel sounds are normal. She exhibits no distension. There is no tenderness. There is no rebound and no CVA tenderness.  Musculoskeletal: Normal range of motion. She exhibits no edema and no tenderness.  Neurological: She is alert and oriented to person, place, and time. She has normal strength. No cranial nerve deficit or sensory deficit. GCS eye subscore is 4. GCS verbal subscore is 5. GCS motor subscore is 6.  Skin: Skin is warm and dry.  No abrasion and no rash noted.  Psychiatric: She has a normal mood and affect. Her speech is normal and behavior is normal.    ED Course  Procedures (including critical care time)   Labs Reviewed  CBC  BASIC METABOLIC PANEL  TROPONIN I   No results found.   No diagnosis found.    MDM   Date: 08/23/2012  Rate: 83  Rhythm: normal sinus rhythm  QRS Axis: normal  Intervals: normal  ST/T Wave abnormalities: normal  Conduction Disutrbances:first-degree A-V block   Narrative Interpretation:   Old EKG Reviewed: none available   Spoke with cardiology and he'll come and admit the patient          Leota Jacobsen, MD 08/23/12 1436

## 2012-08-23 NOTE — ED Notes (Signed)
Pt requested and was provided graham crackers, peanut butter and sprite.

## 2012-08-23 NOTE — ED Notes (Signed)
Rosaria Ferries, PA in to examine. EKG repeated. Verbal order - ok to have graham crackers

## 2012-08-23 NOTE — ED Notes (Signed)
TUMS ordered from pharmacy

## 2012-08-23 NOTE — Consult Note (Signed)
[facility] Renal Consultation Note  Indication for Consultation:  Management of ESRD/hemodialysis; anemia, hypertension/volume and secondary hyperparathyroidism  HPI: Victoria [name] is a 56 y.o. female  With History of ESRD secondary to Hypertension on Dialysis since 1992 admitted with Chest pain ;mid sternal occuring while on dialysis this am. She reports no sob or chest pain before HD. Has been in her normal state of health, not missing any Dialysis treatments and staying her entire time.. Chest pain started  with hd  This am the uf was stopped then restarted and because of the recurrent pain EMS  was called and she was sent for evaluation to ER.  Now in the ER without chestpain and being evaluated by Cardiology with plans for Myoview in Am. Noted ekg without ischemic changes and CE negative initially.      Past Medical History  Diagnosis Date  . Kidney failure     HD since approx 1992  . Hypertension     Now controlled by HD    Past Surgical History  Procedure Date  . Confirm procedure     Patient had "draf in arm" on history form.  . Dg av dialysis shunt access exist*r* or   . Total abdominal hysterectomy 2007  . Parathyroidectomy 2012    With implant to left forearm  . Thyroidectomy, partial 2012    Isthmusectomy       Family History  Problem Relation Age of Onset  . Hypertension Father   . Thyroid disease Father   . Hypertension Sister   . 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 She does not have any smokeless tobacco history on file. She reports that she does not drink alcohol or use illicit drugs.   Allergies  Allergen Reactions  . Lisinopril Swelling  . Norvasc (Amlodipine Besylate) Swelling    Prior to Admission medications   Medication Sig Start Date End Date Taking? Authorizing Provider  calcium carbonate (TUMS  - DOSED IN MG ELEMENTAL CALCIUM) 500 MG chewable tablet Chew 1 tablet by mouth daily as needed. For indigestion.   Yes Historical Provider, MD    Continuous:     ROS: Mild constipation but stool ed in er without Melena, denies any fever, chills, seats only positives as in HPI.   Physical Exam: Filed Vitals:   08/23/12 1405  BP: 133/86  Pulse: 90  Temp: 98.3 F (36.8 C)  Resp: 20     General: Alert, BF , NAD, appropriate HEENT: [place], eomi,MMM, nonicterus Eyes: eomi, nonicterus Neck: supple, mild jvd Heart: RRR, no rub or murmur Lungs: Right basilar rales Abdomen:  Obese, soft, nontender Extremities: no pedal edema Skin: no overt rash ,orulcers Neuro: O X 3 , no deficits Dialysis Access: pos. Bruit. Right fa avgg withhd needles intact  Dialysis Orders: Center: [place]  on mwf . EDW 91 HD Bath 2.0k,3.5 CA  Time 3.5 hrs Heparin . Access r fa avgg BFR 400 DFR 800    Zemplar 1 mcg IV/HD Epogen 0   Units IV/HD  Venofer  0  Other 0  Assessment/Plan 1. Chest pain= eval per Card. In progress 2.ESRD -  Hd mwf , complete hd treatment today 3.Hypertension/volume  - hd and no meds on home listing 4.Anemia  - hgb .> 12 no epo 5.Metabolic bone disease -   Binders  and zemplar  Ernest Haber, PA-C [facility] [phone] 08/23/2012, 2:35 PM   Patient seen and examined and agree with assessment and plan as above.  Kelly Splinter  MD [facility] [phone] pgr    [phone] cell 08/23/2012, 5:48 PM

## 2012-08-23 NOTE — ED Notes (Signed)
Snack given to pt by tech.  Pt denies any other needs.

## 2012-08-23 NOTE — ED Notes (Signed)
Meal tray at bedside.  

## 2012-08-24 ENCOUNTER — Encounter (HOSPITAL_COMMUNITY): Payer: Self-pay | Admitting: Physician Assistant

## 2012-08-24 ENCOUNTER — Observation Stay (HOSPITAL_COMMUNITY): Payer: Medicare Other

## 2012-08-24 DIAGNOSIS — R079 Chest pain, unspecified: Secondary | ICD-10-CM

## 2012-08-24 HISTORY — PX: CARDIOVASCULAR STRESS TEST: SHX262

## 2012-08-24 LAB — COMPREHENSIVE METABOLIC PANEL
ALT: 41 U/L — ABNORMAL HIGH (ref 0–35)
Alkaline Phosphatase: 43 U/L (ref 39–117)
BUN: 21 mg/dL (ref 6–23)
Chloride: 99 mEq/L (ref 96–112)
GFR calc Af Amer: 8 mL/min — ABNORMAL LOW (ref >90)
Glucose, Bld: 90 mg/dL (ref 70–99)
Potassium: 4 mEq/L (ref 3.5–5.1)
Sodium: 141 mEq/L (ref 135–145)
Total Bilirubin: 0.3 mg/dL (ref 0.3–1.2)

## 2012-08-24 LAB — LIPID PANEL
Cholesterol: 198 mg/dL (ref 0–200)
Total CHOL/HDL Ratio: 4.5 RATIO
VLDL: 33 mg/dL (ref 0–40)

## 2012-08-24 MED ORDER — REGADENOSON 0.4 MG/5ML IV SOLN
0.4000 mg | Freq: Once | INTRAVENOUS | Status: AC
  Administered 2012-08-24: 0.4 mg via INTRAVENOUS
  Filled 2012-08-24: qty 5

## 2012-08-24 MED ORDER — REGADENOSON 0.4 MG/5ML IV SOLN
INTRAVENOUS | Status: AC
  Administered 2012-08-24: 0.4 mg via INTRAVENOUS
  Filled 2012-08-24: qty 5

## 2012-08-24 MED ORDER — RENA-VITE PO TABS: 1.0000 | ORAL_TABLET | Freq: Every day | ORAL | Status: DC

## 2012-08-24 MED ORDER — TECHNETIUM TC 99M SESTAMIBI GENERIC - CARDIOLITE
30.0000 | Freq: Once | INTRAVENOUS | Status: AC | PRN
  Administered 2012-08-24: 30 via INTRAVENOUS

## 2012-08-24 MED ORDER — ASPIRIN 81 MG PO TBEC: 81.0000 mg | DELAYED_RELEASE_TABLET | Freq: Every day | ORAL | Status: DC

## 2012-08-24 MED ORDER — TECHNETIUM TC 99M SESTAMIBI GENERIC - CARDIOLITE
10.0000 | Freq: Once | INTRAVENOUS | Status: AC | PRN
  Administered 2012-08-24: 10 via INTRAVENOUS

## 2012-08-24 MED ORDER — CALCIUM CARBONATE ANTACID 500 MG PO CHEW: 1.0000 | CHEWABLE_TABLET | Freq: Three times a day (TID) | ORAL | Status: DC

## 2012-08-24 NOTE — Progress Notes (Signed)
Subjective:  Back from myoview, no cp or sob, tolerated hd last pm  Without chest pain Objective Vital signs in last 24 hours: Filed Vitals:   08/24/12 0953 08/24/12 0955 08/24/12 0957 08/24/12 0959  BP: 127/82 140/86 141/85 130/83  Pulse:      Temp:      TempSrc:      Resp:      Height:      Weight:      SpO2:       Weight change:   Intake/Output Summary (Last 24 hours) at 08/24/12 1401 Last data filed at 08/24/12 0850  Gross per 24 hour  Intake      3 ml  Output   2513 ml  Net  -2510 ml   Labs: Basic Metabolic Panel:  Lab [id number] 08/23/12 0821  NA 141 139  K 4.0 3.5  CL 99 99  CO2 31 29  GLUCOSE 90 92  BUN 21 35*  CREATININE 6.12* 7.79*  CALCIUM 9.1 9.7  ALB -- --  PHOS -- --   Liver Function Tests:  Lab 08/24/12 0245  AST 42*  ALT 41*  ALKPHOS 43  BILITOT 0.3  PROT 6.8  ALBUMIN 3.2*   No results found for this basename: LIPASE:3,AMYLASE:3 in the last 168 hours No results found for this basename: AMMONIA:3 in the last 168 hours CBC:  Lab 08/23/12 0821  WBC 7.1  NEUTROABS --  HGB 13.0  HCT 39.4  MCV 82.1  PLT 191   Cardiac Enzymes:  Lab 08/24/12 0241 08/23/12 2137 08/23/12 1629 08/23/12 1345 08/23/12 0823  CKTOTAL -- -- -- 127 --  CKMB -- -- -- 3.0 --  CKMBINDEX -- -- -- -- --  TROPONINI <0.30 <0.30 <0.30 -- <0.30   CBG: No results found for this basename: GLUCAP:5 in the last 168 hours  Iron Studies: No results found for this basename: IRON,TIBC,TRANSFERRIN,FERRITIN in the last 72 hours Studies/Results: Dg Chest 2 View  08/23/2012  *RADIOLOGY REPORT*  Clinical Data: Chest pain shortness of breath on dialysis. Nonproductive cough for 2 weeks.  CHEST - 2 VIEW  Comparison: Chest radiograph 11/21/2010  Findings: Stable mild cardiomegaly.  Stable tortuous thoracic aorta contour. There is a faint small pleural-based opacity at the right lung base, best seen on the lateral view, which is not present on prior chest radiograph of March 2012.   The remainder the right lung field is clear.  The left lung is clear. Surgical clips in the thoracic inlet on the left. No acute bony abnormality identified.  IMPRESSION: Small pleural-based opacity right lung base laterally.  Small/early focus of pneumonia cannot be excluded.  Focal pleural thickening with atelectasis could have a similar appearance.   Original Report Authenticated By: Curlene Dolphin, M.D.    Medications:      . aspirin EC  81 mg Oral Daily  . calcium carbonate  1 tablet Oral Daily  . enoxaparin (LOVENOX) injection  30 mg Subcutaneous Q24H  . multivitamin  1 tablet Oral Daily  . paricalcitol  1 mcg Intravenous Q M,W,F-HD  . [COMPLETED] regadenoson  0.4 mg Intravenous Once  . sodium chloride  3 mL Intravenous Q12H   I  have reviewed scheduled and prn medications.  General: Alert, NAD, appropriate  Heart: RRR, no rub or murmur  Lung: CTA bilaterally  Abdomen: Obese, soft, nontender  Extremities: no pedal edema  Dialysis Access: pos. Bruit. Right fa avgg  Dialysis Orders: Center: [place] on mwf .  EDW 91 HD  [place]ath 2.0k,3.5 CA Time 3.5 hrs Heparin . Access r fa avgg BFR 400 DFR 800  Zemplar 1 mcg IV/HD Epogen 0 Units IV/HD Venofer 0  Other 0   Assessment/Plan   1. Chest pain= eval per Card. Sp Myoview 2.ESRD - Hd mwf , completed hd treatment yesterday without chest pain in hospital 3.Hypertension/volume - BP =130/83 / hd and no meds on home listing  4.Anemia - hgb .> 12 no epo . 13.0 hgb 5.Metabolic bone disease - Binders and zemplar   Ernest Haber, PA-C [facility] [phone] 08/24/2012,2:01 PM  LOS: 1 day   Patient seen and examined and agree with assessment and plan as above. Myoview + for scar but no ischemia. For d/c today.  Kelly Splinter  MD [facility] [phone] pgr    [phone] cell 08/24/2012, 4:30 PM

## 2012-08-24 NOTE — Progress Notes (Signed)
Lexiscan Myoview completed. Await results. Dayna Dunn PA-C

## 2012-08-24 NOTE — Progress Notes (Signed)
   CARE MANAGEMENT NOTE 08/24/2012  Patient:  Victoria [name], Victoria [name]   Account Number:  [id number]  Date Initiated:  08/24/2012  Documentation initiated by:  GRAVES-BIGELOW,Shalise Rosado  Subjective/Objective Assessment:   Pt admitted with cp. Plan for d/c today afet myoview read.     Action/Plan:   No needs from CM at this time.   Anticipated DC Date:  08/24/2012   Anticipated DC Plan:  [place]  CM consult      Choice offered to / List presented to:             Status of service:  Completed, signed off Medicare Important Message given?   (If response is "NO", the following Medicare IM given date fields will be blank) Date Medicare IM given:   Date Additional Medicare IM given:    Discharge Disposition:  HOME/SELF CARE  Per UR Regulation:  Reviewed for med. necessity/level of care/duration of stay  If discussed at [place] of Stay Meetings, dates discussed:    Comments:

## 2012-08-24 NOTE — Progress Notes (Signed)
    SUBJECTIVE: She feels well. No events overnight. No chest pain last night on dialysis. No SOB this am.   BP 91/61  Pulse 103  Temp 98.2 F (36.8 C) (Oral)  Resp 18  Ht 5\' 2"  (1.575 m)  Wt 203 lb (92.08 kg)  BMI 37.13 kg/m2  SpO2 100%  Intake/Output Summary (Last 24 hours) at 08/24/12 0719 Last data filed at 08/23/12 2121  Gross per 24 hour  Intake      3 ml  Output   2513 ml  Net  -2510 ml    PHYSICAL EXAM General: Well developed, well nourished, in no acute distress. Alert and oriented x 3.  Psych:  Good affect, responds appropriately Neck: No JVD. No masses noted.  Lungs: Clear bilaterally with no wheezes or rhonci noted.  Heart: RRR with no murmurs noted. Abdomen: Bowel sounds are present. Soft, non-tender.  Extremities: No lower extremity edema.   LABS: Basic Metabolic Panel:  Basename 08/24/12 0245 08/23/12 0821  NA 141 139  K 4.0 3.5  CL 99 99  CO2 31 29  GLUCOSE 90 92  BUN 21 35*  CREATININE 6.12* 7.79*  CALCIUM 9.1 9.7  MG -- --  PHOS -- --   CBC:  Basename 08/23/12 0821  WBC 7.1  NEUTROABS --  HGB 13.0  HCT 39.4  MCV 82.1  PLT 191   Cardiac Enzymes:  Basename 08/24/12 0241 08/23/12 2137 08/23/12 1629 08/23/12 1345  CKTOTAL -- -- -- 127  CKMB -- -- -- 3.0  CKMBINDEX -- -- -- --  TROPONINI <0.30 <0.30 <0.30 --   Fasting Lipid Panel:  Basename 08/24/12 0245  CHOL 198  HDL 44  LDLCALC 121*  TRIG 167*  CHOLHDL 4.5  LDLDIRECT --    Current Meds:    . aspirin EC  81 mg Oral Daily  . calcium carbonate  1 tablet Oral Daily  . enoxaparin (LOVENOX) injection  30 mg Subcutaneous Q24H  . multivitamin  1 tablet Oral Daily  . paricalcitol  1 mcg Intravenous Q M,W,F-HD  . regadenoson  0.4 mg Intravenous Once  . sodium chloride  3 mL Intravenous Q12H     ASSESSMENT AND PLAN:  1. Chest pain: She had severe chest pain while on dialysis. Cardiac markers negative. Plans for stress myoview today. If no evidence of ischemia, she can be  discharged home later today.    Victoria [name]  12/5/20137:19 AM

## 2012-08-24 NOTE — Discharge Summary (Signed)
Discharge Summary   Patient ID: Victoria [name] MRN: [id number], DOB/AGE: 1956/04/05 56 y.o. Admit date: 08/23/2012 D/C date:     08/24/2012  Primary Cardiologist: new to [facility] this admission  Primary Discharge Diagnoses:  1. Chest pain, atypical  - Myoview 12/5 with scar but no ischemia, normal LV function 2. ESRD on hemodialysis 3. HTN  Hospital Course: Victoria [name] is a 56 year old female with no previous cardiac history. She has been on dialysis since she was in her 17s. Yesterday after being on dialysis for about 45 minutes, she developed substernal chest pain she describes as a "pinch" and a 4/10. She told the nursing staff about it and they took her off dialysis. When they took her off dialysis the pain resolved. They then put her back on dialysis and she developed a pinching pain again. The pain did not change with deep inspiration or cough. Total duration of the pain was less than 30 minutes for both episodes. After the second occurrence, they took her off dialysis, the pain resolved and she was sent to the emergency room. She has never had this pain before. She has no history of exertional chest pain and has chronic exertional dyspnea, worse right before dialysis but this has not changed recently. She has had recent dietary indiscretions because of the holidays. She was admitted to the cardiology service for rule-out. EKG was without ischemic changes. She was seen by renal and completed dialysis last night. This morning she felt well without any events overnight. Lexiscan myoview was performed demonstrating inferolateral and anteroseptal areas of scar, but no findings for myocardial ischemia & normal wall motion and calculated ejection fraction at 77%. Per discussion with MD, will hold off on statin (AST/ALT very minimally elevated). We will see her back in the office later this month to make sure she is doing well. Dr. Angelena Form has seen and examined her today and feels she is stable for  discharge. I discussed her binder therapy with renal since she was on minimal medications prior to admission. They recommended Calcium carbonate 500mg  (200mg  elemental calcium) TID with meals and have also added a Renal Vit this admission. She will continue dialysis tomorrow as scheduled.  Discharge Vitals: Blood pressure 100/67, pulse 88, temperature 98 F (36.7 C), temperature source Oral, resp. rate 18, height 5\' 2"  (1.575 m), weight 203 lb (92.08 kg), SpO2 98.00%.  Labs: Lab Results  Component Value Date   WBC 7.1 08/23/2012   HGB 13.0 08/23/2012   HCT 39.4 08/23/2012   MCV 82.1 08/23/2012   PLT 191 08/23/2012     Lab 08/24/12 0245  NA 141  K 4.0  CL 99  CO2 31  BUN 21  CREATININE 6.12*  CALCIUM 9.1  PROT 6.8  BILITOT 0.3  ALKPHOS 43  ALT 41*  AST 42*  GLUCOSE 90    Basename 08/24/12 0241 08/23/12 2137 08/23/12 1629 08/23/12 1345 08/23/12 0823  CKTOTAL -- -- -- 127 --  CKMB -- -- -- 3.0 --  TROPONINI <0.30 <0.30 <0.30 -- <0.30   Lab Results  Component Value Date   CHOL 198 08/24/2012   HDL 44 08/24/2012   LDLCALC 121* 08/24/2012   TRIG 167* 08/24/2012    Diagnostic Studies/Procedures   Dg Chest 2 View 08/23/2012  *RADIOLOGY REPORT*  Clinical Data: Chest pain shortness of breath on dialysis. Nonproductive cough for 2 weeks.  CHEST - 2 VIEW  Comparison: Chest radiograph 11/21/2010  Findings: Stable mild cardiomegaly.  Stable tortuous thoracic aorta  contour. There is a faint small pleural-based opacity at the right lung base, best seen on the lateral view, which is not present on prior chest radiograph of March 2012.  The remainder the right lung field is clear.  The left lung is clear. Surgical clips in the thoracic inlet on the left. No acute bony abnormality identified.  IMPRESSION: Small pleural-based opacity right lung base laterally.  Small/early focus of pneumonia cannot be excluded.  Focal pleural thickening with atelectasis could have a similar appearance.   Original  Report Authenticated By: Curlene Dolphin, M.D.    Dg Ankle Complete Right 08/11/2012  *RADIOLOGY REPORT*  Clinical Data: Injured 2 weeks ago with ankle pain and swelling, prior ankle surgery many years ago  RIGHT ANKLE - COMPLETE 3+ VIEW  Comparison: None.  Findings: Two screws are present through the medial malleolus for prior fixation of fracture, and a fixation plate overlies the distal right fibula for fixation of prior fracture as well.  No acute fracture is seen.  There is mild post-traumatic change of the ankle joint . On one view there is irregularity of the tip of the medial malleolus and an acute fracture cannot be excluded.  IMPRESSION: Suspect nondisplaced fracture of the tip of the medial malleolus with adjacent soft tissue swelling.   Original Report Authenticated By: Ivar Drape, M.D.    Nm Myocar Multi W/spect W/wall Motion / Ef 08/24/2012  *RADIOLOGY REPORT*  Clinical Data:  Chest pain.  MYOCARDIAL IMAGING WITH SPECT (REST AND PHARMACOLOGIC-STRESS) GATED LEFT VENTRICULAR WALL MOTION STUDY LEFT VENTRICULAR EJECTION FRACTION  Technique:  Standard myocardial SPECT imaging was performed after resting intravenous injection of 10 mCi Tc-71m sestamibi. Subsequently, intravenous infusion of Lexiscan was performed under the supervision of the Cardiology staff.  At peak effect of the drug, 30 mCi Tc-31m sestamibi was injected intravenously and standard myocardial SPECT  imaging was performed.  Quantitative gated imaging was also performed to evaluate left ventricular wall motion, and estimate left ventricular ejection fraction.  Comparison:  None.  Findings: The SPECT stress and rest images demonstrate two areas of decreased activity in the left ventricle on both the stress and rest images. These are located in the inferolateral wall and anteroseptal wall.  These likely reflect areas of scarring.  No reversible defects to suggest ischemia.  The gated SPECT images demonstrate normal wall motion and myocardial  thickening with contraction.  The estimated ejection fraction was 77%.  IMPRESSION:  1.  Inferolateral and anteroseptal areas of scar. 2.  No findings for myocardial ischemia. 3.  Normal wall motion and calculated ejection fraction at 77%.   Original Report Authenticated By: Marijo Sanes, M.D.     Discharge Medications   Current Discharge Medication List    START taking these medications   Details  aspirin EC 81 MG EC tablet Take 1 tablet (81 mg total) by mouth daily.    multivitamin (RENA-VIT) TABS tablet Take 1 tablet by mouth daily. Rena-Vite      CONTINUE these medications which have CHANGED   Details  calcium carbonate (TUMS - DOSED IN MG ELEMENTAL CALCIUM) 500 MG chewable tablet Chew 1 tablet (200 mg of elemental calcium total) by mouth 3 (three) times daily with meals.        Disposition   The patient will be discharged in stable condition to home. Discharge Orders    Future Appointments: Provider: Department: Dept Phone: Center:   09/18/2012 10:10 AM Liliane Shi, PA [place]) [phone] [id number]  Future Orders Please Complete By Expires   Diet - low sodium heart healthy      Comments:   Renal Diet   Increase activity slowly        Follow-up Information    Follow up with DETERDING,JAMES L, MD. (Follow up with dialysis/kidney doctor as scheduled.)    Contact information:   [place] [address] [phone]       Follow up with Richardson Dopp, PA. (09/18/12 at 10:10am)    Contact information:   [facility] [id number] [address] [phone]            Duration of Discharge Encounter: Greater than 30 minutes including physician and PA time.  Signed, Melina Copa PA-C 08/24/2012, 3:59 PM

## 2012-08-24 NOTE — Discharge Summary (Signed)
See full note this am. cdm 

## 2012-08-24 NOTE — Progress Notes (Signed)
Pt provided with dc instructions and educatino. Pt verbalized understanding. Pt educated on all medications and is aware of when to take them tonight. Pt has no questions at this time. IV removed with tip intact. Heart monitor cleaned and returned to front. VSS. Denies CP/SOB at this time. Pt leaving by foot for home. Dorna Bloom, RN

## 2012-09-18 ENCOUNTER — Encounter: Payer: Medicare Other | Admitting: Physician Assistant

## 2012-09-22 ENCOUNTER — Encounter (INDEPENDENT_AMBULATORY_CARE_PROVIDER_SITE_OTHER): Payer: Medicare Other | Admitting: *Deleted

## 2012-09-22 ENCOUNTER — Encounter: Payer: Self-pay | Admitting: Surgery

## 2012-09-22 ENCOUNTER — Ambulatory Visit (INDEPENDENT_AMBULATORY_CARE_PROVIDER_SITE_OTHER): Payer: Medicare Other | Admitting: Surgery

## 2012-09-22 VITALS — BP 129/79 | HR 91 | Temp 98.3°F | Resp 18 | Ht 63.5 in | Wt 202.0 lb

## 2012-09-22 DIAGNOSIS — N186 End stage renal disease: Secondary | ICD-10-CM | POA: Insufficient documentation

## 2012-09-22 DIAGNOSIS — T82898A Other specified complication of vascular prosthetic devices, implants and grafts, initial encounter: Secondary | ICD-10-CM

## 2012-09-22 NOTE — Progress Notes (Signed)
[facility] [place]   Patient name: Victoria [name] MRN: [id number] DOB: 02-15-1956 Sex: female     Chief Complaint  Patient presents with  . Re-evaluation    check graft right arm    HISTORY OF PRESENT ILLNESS: The patient was seen as an add on today from dialysis. The patient reports that there was a peripheral type drainage from an area on the medial side of her graft at dialysis. She went ahead and had a full one of dialysis through a different access site. She is here today to evaluate for an infected graft. She denies fevers or chills. She has not had any difficulty with dialysis. She denies any pain around the involved area.  Past Medical History  Diagnosis Date  . Hypertension     Now controlled by HD  . Complication of anesthesia ~ 2011    "they gave me a medicine that swolled me up" (08/23/2012)  . Seasonal allergies   . ESRD on dialysis     HD since approx 1992; "Jeneen Rinks; M, W, F"  . H/O cardiovascular stress test     Myoview 08/24/12: Inferolateral and anteroseptal areas of scar, no findings of ischemia, EF 77%.    Past Surgical History  Procedure Date  . Parathyroidectomy 2012    With implant to left forearm  . Thyroidectomy, partial 2012    Isthmusectomy   . Total abdominal hysterectomy 2007  . Dilation and curettage of uterus 1970's    "when I was pregnant in my tubes" (08/23/2012)  . Thrombectomy and revision of arterioventous (av) goretex  graft 2012, 2013  . Dialysis fistula creation 1992    "left forearm" (08/23/2012)  . Arteriovenous graft placement 1990's; ~ 2011    "left upper arm; right forearm" (08/23/2012)  . Ankle fracture surgery ~ 2009    "right" (08/23/2012)    History   Social History  . Marital Status: Married    Spouse Name: N/A    Number of Children: N/A  . Years of Education: N/A   Occupational History  . Not on file.   Social History Main Topics  . Smoking status: Never Smoker   . Smokeless tobacco: Never  Used  . Alcohol Use: No     Comment: quit 2007  . Drug Use: No  . Sexually Active: No   Other Topics Concern  . Not on file   Social History Narrative  . No narrative on file    Family History  Problem Relation Age of Onset  . Hypertension Father   . Thyroid disease Father   . Hypertension Sister   . Thyroid disease Sister     Allergies as of 09/22/2012 - Review Complete 09/22/2012  Allergen Reaction Noted  . Lisinopril Swelling and Other (See Comments) 03/10/2011  . Norvasc (amlodipine besylate) Swelling 03/10/2011    Current Outpatient Prescriptions on File Prior to Visit  Medication Sig Dispense Refill  . aspirin EC 81 MG EC tablet Take 1 tablet (81 mg total) by mouth daily.      . calcium carbonate (TUMS - DOSED IN MG ELEMENTAL CALCIUM) 500 MG chewable tablet Chew 1 tablet (200 mg of elemental calcium total) by mouth 3 (three) times daily with meals.      . multivitamin (RENA-VIT) TABS tablet Take 1 tablet by mouth daily. Rena-Vite         REVIEW OF SYSTEMS: Per the patient encountered one, all negative  PHYSICAL EXAMINATION:   Vital signs are  BP 129/79  Pulse 91  Temp 98.3 F (36.8 C)  Resp 18  Ht 5' 3.5" (1.613 m)  Wt 202 lb (91.627 kg)  BMI 35.22 kg/m2 General: The patient appears their stated age. HEENT:  No gross abnormalities Pulmonary:  Non labored breathing Musculoskeletal: There are no major deformities. Neurologic: No focal weakness or paresthesias are detected, Skin: There are no ulcer or rashes noted. Psychiatric: The patient has normal affect. Cardiovascular: The patient has a functioning right forearm graft. 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 There is an area with a callus/scabbed up area where there was drainage. I was able to express what looked like old blood from this area rather than pus. There is no surrounding erythema.   Diagnostic Studies I had the graft ultrasounded today. There is no fluid around the  graft to suggest infection  Assessment: Possible infected right forearm graft Plan: I told the patient that if the graft were infected, it would need to be removed. I like to do everything we can to try to salvage this graft. I see no obvious reason to take the graft out today. Therefore, I would recommend a course of antibiotics, to be given at dialysis to see if she can avoid losing this graft. I have scheduled her to come back to see me in 2 weeks for a repeat evaluation. I told her that if she develops fevers, chills, pain on her graft site or redness on the graft site that she needs to contact me so that we can remove her graft. 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 This would be something where a covered stent could be utilized.  Eldridge Abrahams, M.D. [facility] [place] Office: [phone] Pager:  [phone]

## 2012-09-26 ENCOUNTER — Encounter: Payer: Medicare Other | Admitting: Physician Assistant

## 2012-09-27 ENCOUNTER — Telehealth: Payer: Self-pay | Admitting: Vascular Surgery

## 2012-09-27 ENCOUNTER — Encounter (HOSPITAL_COMMUNITY): Payer: Self-pay | Admitting: Anesthesiology

## 2012-09-27 ENCOUNTER — Emergency Department (HOSPITAL_COMMUNITY): Payer: Medicare Other | Admitting: Anesthesiology

## 2012-09-27 ENCOUNTER — Ambulatory Visit: Admit: 2012-09-27 | Payer: Self-pay | Admitting: Vascular Surgery

## 2012-09-27 ENCOUNTER — Encounter (HOSPITAL_COMMUNITY)
Admission: EM | Disposition: A | Discharge status: Home / Self Care | Payer: Self-pay | Source: Home / Self Care | Attending: Emergency Medicine

## 2012-09-27 ENCOUNTER — Other Ambulatory Visit: Payer: Self-pay

## 2012-09-27 ENCOUNTER — Emergency Department (HOSPITAL_COMMUNITY)
Admission: EM | Admit: 2012-09-27 | Discharge: 2012-09-27 | Disposition: A | Discharge status: Home / Self Care | Payer: Medicare Other | Attending: Emergency Medicine | Admitting: Emergency Medicine

## 2012-09-27 DIAGNOSIS — N186 End stage renal disease: Secondary | ICD-10-CM | POA: Insufficient documentation

## 2012-09-27 DIAGNOSIS — Z992 Dependence on renal dialysis: Secondary | ICD-10-CM | POA: Insufficient documentation

## 2012-09-27 DIAGNOSIS — R58 Hemorrhage, not elsewhere classified: Secondary | ICD-10-CM

## 2012-09-27 DIAGNOSIS — I120 Hypertensive chronic kidney disease with stage 5 chronic kidney disease or end stage renal disease: Secondary | ICD-10-CM | POA: Insufficient documentation

## 2012-09-27 DIAGNOSIS — T82898A Other specified complication of vascular prosthetic devices, implants and grafts, initial encounter: Secondary | ICD-10-CM

## 2012-09-27 DIAGNOSIS — Y849 Medical procedure, unspecified as the cause of abnormal reaction of the patient, or of later complication, without mention of misadventure at the time of the procedure: Secondary | ICD-10-CM | POA: Insufficient documentation

## 2012-09-27 HISTORY — PX: REVISION OF ARTERIOVENOUS GORETEX GRAFT: SHX6073

## 2012-09-27 LAB — BASIC METABOLIC PANEL
BUN: 17 mg/dL (ref 6–23)
Calcium: 11.1 mg/dL — ABNORMAL HIGH (ref 8.4–10.5)
Creatinine, Ser: 5.38 mg/dL — ABNORMAL HIGH (ref 0.50–1.10)
GFR calc non Af Amer: 8 mL/min — ABNORMAL LOW (ref >90)
Glucose, Bld: 101 mg/dL — ABNORMAL HIGH (ref 70–99)
Sodium: 139 mEq/L (ref 135–145)

## 2012-09-27 LAB — CBC WITH DIFFERENTIAL/PLATELET
Eosinophils Absolute: 0.8 10*3/uL — ABNORMAL HIGH (ref 0.0–0.7)
Eosinophils Relative: 7 % — ABNORMAL HIGH (ref 0–5)
Lymphs Abs: 2.8 10*3/uL (ref 0.7–4.0)
MCH: 27.1 pg (ref 26.0–34.0)
MCV: 80.7 fL (ref 78.0–100.0)
Monocytes Relative: 5 % (ref 3–12)
Platelets: 230 10*3/uL (ref 150–400)
RBC: 4.72 MIL/uL (ref 3.87–5.11)

## 2012-09-27 SURGERY — REVISION OF ARTERIOVENOUS GORETEX GRAFT
Anesthesia: General | Site: Arm Lower | Laterality: Right | Wound class: Contaminated

## 2012-09-27 MED ORDER — SODIUM CHLORIDE 0.9 % IR SOLN
Status: DC | PRN
  Administered 2012-09-27: 12:00:00

## 2012-09-27 MED ORDER — DEXTROSE 5 % IV SOLN
INTRAVENOUS | Status: AC
  Filled 2012-09-27: qty 50

## 2012-09-27 MED ORDER — CEFUROXIME SODIUM 1.5 G IJ SOLR
INTRAMUSCULAR | Status: AC
  Filled 2012-09-27: qty 1.5

## 2012-09-27 MED ORDER — DEXTROSE 5 % IV SOLN
INTRAVENOUS | Status: DC | PRN
  Administered 2012-09-27: 12:00:00 via INTRAVENOUS

## 2012-09-27 MED ORDER — PHENYLEPHRINE HCL 10 MG/ML IJ SOLN
INTRAMUSCULAR | Status: DC | PRN
  Administered 2012-09-27 (×2): 80 ug via INTRAVENOUS
  Administered 2012-09-27 (×4): 40 ug via INTRAVENOUS
  Administered 2012-09-27 (×5): 80 ug via INTRAVENOUS
  Administered 2012-09-27 (×2): 40 ug via INTRAVENOUS
  Administered 2012-09-27: 80 ug via INTRAVENOUS

## 2012-09-27 MED ORDER — MIDAZOLAM HCL 5 MG/5ML IJ SOLN
INTRAMUSCULAR | Status: DC | PRN
  Administered 2012-09-27 (×2): 0.5 mg via INTRAVENOUS

## 2012-09-27 MED ORDER — SODIUM CHLORIDE 0.9 % IV SOLN
INTRAVENOUS | Status: DC | PRN
  Administered 2012-09-27 (×2): via INTRAVENOUS

## 2012-09-27 MED ORDER — 0.9 % SODIUM CHLORIDE (POUR BTL) OPTIME
TOPICAL | Status: DC | PRN
  Administered 2012-09-27: 1000 mL

## 2012-09-27 MED ORDER — DEXTROSE 5 % IV SOLN
1.5000 g | INTRAVENOUS | Status: AC
  Administered 2012-09-27: 1.5 g via INTRAVENOUS
  Filled 2012-09-27: qty 1.5

## 2012-09-27 MED ORDER — OXYCODONE HCL 5 MG PO TABS: 5.0000 mg | ORAL_TABLET | Freq: Once | ORAL | Status: DC | PRN

## 2012-09-27 MED ORDER — HYDROMORPHONE HCL PF 1 MG/ML IJ SOLN
INTRAMUSCULAR | Status: AC
  Filled 2012-09-27: qty 1

## 2012-09-27 MED ORDER — SUCCINYLCHOLINE CHLORIDE 20 MG/ML IJ SOLN
INTRAMUSCULAR | Status: DC | PRN
  Administered 2012-09-27: 100 mg via INTRAVENOUS

## 2012-09-27 MED ORDER — LIDOCAINE HCL (CARDIAC) 20 MG/ML IV SOLN
INTRAVENOUS | Status: DC | PRN
  Administered 2012-09-27: 100 mg via INTRAVENOUS

## 2012-09-27 MED ORDER — ONDANSETRON HCL 4 MG/2ML IJ SOLN
INTRAMUSCULAR | Status: DC | PRN
  Administered 2012-09-27: 4 mg via INTRAVENOUS

## 2012-09-27 MED ORDER — PROMETHAZINE HCL 25 MG/ML IJ SOLN
6.2500 mg | INTRAMUSCULAR | Status: DC | PRN
  Filled 2012-09-27: qty 1

## 2012-09-27 MED ORDER — HYDROMORPHONE HCL PF 1 MG/ML IJ SOLN
0.2500 mg | INTRAMUSCULAR | Status: DC | PRN
  Administered 2012-09-27: 0.5 mg via INTRAVENOUS

## 2012-09-27 MED ORDER — FENTANYL CITRATE 0.05 MG/ML IJ SOLN
INTRAMUSCULAR | Status: DC | PRN
  Administered 2012-09-27 (×2): 50 ug via INTRAVENOUS
  Administered 2012-09-27: 100 ug via INTRAVENOUS

## 2012-09-27 MED ORDER — OXYCODONE HCL 5 MG/5ML PO SOLN: 5.0000 mg | Freq: Once | ORAL | Status: DC | PRN

## 2012-09-27 MED ORDER — ALBUMIN HUMAN 5 % IV SOLN
INTRAVENOUS | Status: DC | PRN
  Administered 2012-09-27 (×2): via INTRAVENOUS

## 2012-09-27 MED ORDER — PROPOFOL 10 MG/ML IV BOLUS
INTRAVENOUS | Status: DC | PRN
  Administered 2012-09-27: 150 mg via INTRAVENOUS
  Administered 2012-09-27: 30 mg via INTRAVENOUS

## 2012-09-27 SURGICAL SUPPLY — 76 items
ADH SKN CLS APL DERMABOND .7 (GAUZE/BANDAGES/DRESSINGS) ×2
BAG DECANTER FOR FLEXI CONT (MISCELLANEOUS) ×3 IMPLANT
BANDAGE ELASTIC 4 VELCRO ST LF (GAUZE/BANDAGES/DRESSINGS) ×2 IMPLANT
BANDAGE GAUZE ELAST BULKY 4 IN (GAUZE/BANDAGES/DRESSINGS) ×2 IMPLANT
CANISTER SUCTION 2500CC (MISCELLANEOUS) ×3 IMPLANT
CATH CANNON HEMO 15F 50CM (CATHETERS) IMPLANT
CATH CANNON HEMO 15FR 19 (HEMODIALYSIS SUPPLIES) IMPLANT
CATH CANNON HEMO 15FR 23CM (HEMODIALYSIS SUPPLIES) IMPLANT
CATH CANNON HEMO 15FR 31CM (HEMODIALYSIS SUPPLIES) IMPLANT
CATH CANNON HEMO 15FR 32 (HEMODIALYSIS SUPPLIES) IMPLANT
CATH CANNON HEMO 15FR 32CM (HEMODIALYSIS SUPPLIES) IMPLANT
CATH EMB 4FR 80CM (CATHETERS) ×2 IMPLANT
CATH EMB 5FR 80CM (CATHETERS) ×2 IMPLANT
CLIP TI MEDIUM 6 (CLIP) ×3 IMPLANT
CLIP TI WIDE RED SMALL 6 (CLIP) ×3 IMPLANT
CLOTH BEACON ORANGE TIMEOUT ST (SAFETY) ×3 IMPLANT
COVER PROBE W GEL 5X96 (DRAPES) IMPLANT
COVER SURGICAL LIGHT HANDLE (MISCELLANEOUS) ×3 IMPLANT
DECANTER SPIKE VIAL GLASS SM (MISCELLANEOUS) ×3 IMPLANT
DERMABOND ADVANCED (GAUZE/BANDAGES/DRESSINGS) ×1
DERMABOND ADVANCED .7 DNX12 (GAUZE/BANDAGES/DRESSINGS) ×1 IMPLANT
DRAPE C-ARM 42X72 X-RAY (DRAPES) ×3 IMPLANT
DRAPE CHEST BREAST 15X10 FENES (DRAPES) ×5 IMPLANT
ELECT REM PT RETURN 9FT ADLT (ELECTROSURGICAL) ×3
ELECTRODE REM PT RTRN 9FT ADLT (ELECTROSURGICAL) ×2 IMPLANT
GAUZE SPONGE 2X2 8PLY STRL LF (GAUZE/BANDAGES/DRESSINGS) IMPLANT
GEL ULTRASOUND 20GR AQUASONIC (MISCELLANEOUS) ×1 IMPLANT
GLOVE BIOGEL M 6.5 STRL (GLOVE) ×2 IMPLANT
GLOVE BIOGEL PI IND STRL 6.5 (GLOVE) ×4 IMPLANT
GLOVE BIOGEL PI IND STRL 7.0 (GLOVE) ×1 IMPLANT
GLOVE BIOGEL PI IND STRL 7.5 (GLOVE) ×1 IMPLANT
GLOVE BIOGEL PI INDICATOR 6.5 (GLOVE) ×4
GLOVE BIOGEL PI INDICATOR 7.0 (GLOVE) ×1
GLOVE BIOGEL PI INDICATOR 7.5 (GLOVE) ×1
GLOVE SS BIOGEL STRL SZ 7 (GLOVE) ×5 IMPLANT
GLOVE SUPERSENSE BIOGEL SZ 7 (GLOVE) ×4
GLOVE SURG SS PI 6.0 STRL IVOR (GLOVE) ×2 IMPLANT
GLOVE SURG SS PI 7.5 STRL IVOR (GLOVE) ×2 IMPLANT
GOWN STRL NON-REIN LRG LVL3 (GOWN DISPOSABLE) ×12 IMPLANT
GRAFT GORETEX STND 6X20 (Vascular Products) ×3 IMPLANT
GRAFT GORETEXSTD 6X20 (Vascular Products) ×1 IMPLANT
KIT BASIN OR (CUSTOM PROCEDURE TRAY) ×3 IMPLANT
KIT ROOM TURNOVER OR (KITS) ×3 IMPLANT
NDL 18GX1X1/2 (RX/OR ONLY) (NEEDLE) ×1 IMPLANT
NDL HYPO 25GX1X1/2 BEV (NEEDLE) ×1 IMPLANT
NEEDLE 18GX1X1/2 (RX/OR ONLY) (NEEDLE) IMPLANT
NEEDLE 22X1 1/2 (OR ONLY) (NEEDLE) ×3 IMPLANT
NEEDLE HYPO 25GX1X1/2 BEV (NEEDLE) ×3 IMPLANT
NS IRRIG 1000ML POUR BTL (IV SOLUTION) ×3 IMPLANT
PACK CV ACCESS (CUSTOM PROCEDURE TRAY) ×3 IMPLANT
PACK SURGICAL SETUP 50X90 (CUSTOM PROCEDURE TRAY) ×1 IMPLANT
PAD ARMBOARD 7.5X6 YLW CONV (MISCELLANEOUS) ×6 IMPLANT
SOAP 2 % CHG 4 OZ (WOUND CARE) ×3 IMPLANT
SPONGE GAUZE 2X2 STER 10/PKG (GAUZE/BANDAGES/DRESSINGS)
SPONGE LAP 18X18 X RAY DECT (DISPOSABLE) ×2 IMPLANT
SUT ETHILON 3 0 PS 1 (SUTURE) ×9 IMPLANT
SUT ETHILON 4 0 PS 2 18 (SUTURE) ×2 IMPLANT
SUT PROLENE 5 0 C 1 24 (SUTURE) ×4 IMPLANT
SUT PROLENE 5 0 C 1 36 (SUTURE) ×2 IMPLANT
SUT PROLENE 6 0 BV (SUTURE) ×7 IMPLANT
SUT SILK 1 TIES 10X30 (SUTURE) ×2 IMPLANT
SUT VIC AB 3-0 SH 18 (SUTURE) ×2 IMPLANT
SUT VIC AB 3-0 SH 27 (SUTURE) ×6
SUT VIC AB 3-0 SH 27X BRD (SUTURE) ×4 IMPLANT
SUT VICRYL 4-0 PS2 18IN ABS (SUTURE) ×3 IMPLANT
SWAB COLLECTION DEVICE MRSA (MISCELLANEOUS) ×2 IMPLANT
SYR 20CC LL (SYRINGE) ×3 IMPLANT
SYR 30ML LL (SYRINGE) IMPLANT
SYR 5ML LL (SYRINGE) ×6 IMPLANT
SYR CONTROL 10ML LL (SYRINGE) ×3 IMPLANT
SYRINGE 10CC LL (SYRINGE) ×3 IMPLANT
TEGADERM 6 CM X 7 CM  REF# 1624W ×8 IMPLANT
TOWEL OR 17X24 6PK STRL BLUE (TOWEL DISPOSABLE) ×3 IMPLANT
TOWEL OR 17X26 10 PK STRL BLUE (TOWEL DISPOSABLE) ×5 IMPLANT
UNDERPAD 30X30 INCONTINENT (UNDERPADS AND DIAPERS) ×3 IMPLANT
WATER STERILE IRR 1000ML POUR (IV SOLUTION) ×3 IMPLANT

## 2012-09-27 NOTE — ED Provider Notes (Signed)
History     CSN: [id number]  Arrival date & time 09/27/12  1005   First MD Initiated Contact with Patient 09/27/12 1015      Chief Complaint  Patient presents with  . Vascular Access Problem    (Consider location/radiation/quality/duration/timing/severity/associated sxs/prior treatment) HPI Comments: Patient brought to the ER by EMS from dialysis for bleeding from her right forearm AV fistula. Patient reports that she had spontaneous bleeding last night while lying in bed around 10 PM. She reports that she coughed and blood shot out "like water". Patient held pressure on the area for a period time and the bleeding stopped. She reports that she had repeat bleeding today while at dialysis. She was referred to the ER for evaluation and possible surgical revision. Dr. Kellie Simmering is aware of the patient coming and wanted to be paged immediately after arrival.   Patient denies headache, blurred vision, chest pain, palpitations, shortness of breath and syncope. She reports mild to moderate pain in the right forearm. She says the pain started several days before the bleeding.   Past Medical History  Diagnosis Date  . Hypertension     Now controlled by HD  . Complication of anesthesia ~ 2011    "they gave me a medicine that swolled me up" (08/23/2012)  . Seasonal allergies   . ESRD on dialysis     HD since approx 1992; "Jeneen Rinks; M, W, F"  . H/O cardiovascular stress test     Myoview 08/24/12: Inferolateral and anteroseptal areas of scar, no findings of ischemia, EF 77%.    Past Surgical History  Procedure Date  . Parathyroidectomy 2012    With implant to left forearm  . Thyroidectomy, partial 2012    Isthmusectomy   . Total abdominal hysterectomy 2007  . Dilation and curettage of uterus 1970's    "when I was pregnant in my tubes" (08/23/2012)  . Thrombectomy and revision of arterioventous (av) goretex  graft 2012, 2013  . Dialysis fistula creation 1992    "left forearm" (08/23/2012)    . Arteriovenous graft placement 1990's; ~ 2011    "left upper arm; right forearm" (08/23/2012)  . Ankle fracture surgery ~ 2009    "right" (08/23/2012)    Family History  Problem Relation Age of Onset  . Hypertension Father   . Thyroid disease Father   . Hypertension Sister   . Thyroid disease Sister     History  Substance Use Topics  . Smoking status: Never Smoker   . Smokeless tobacco: Never Used  . Alcohol Use: No     Comment: quit 2007    OB History    Grav Para Term Preterm Abortions TAB SAB Ect Mult Living                  Review of Systems  Constitutional: Negative for fever.  Respiratory: Negative.   Cardiovascular: Negative.   Musculoskeletal:       Right arm pain  All other systems reviewed and are negative.    Allergies  Lisinopril and Norvasc  Home Medications   Current Outpatient Rx  Name  Route  Sig  Dispense  Refill  . CALCIUM CARBONATE ANTACID 500 MG PO CHEW   Oral   Chew 4 tablets by mouth daily.           BP 136/81  Pulse 120  Temp 98.1 F (36.7 C) (Oral)  Resp 18  SpO2 100%  Physical Exam  Constitutional: She is oriented to person,  place, and time. She appears well-developed and well-nourished. No distress.  HENT:  Head: Normocephalic and atraumatic.  Right Ear: Hearing normal.  Nose: Nose normal.  Mouth/Throat: Oropharynx is clear and moist and mucous membranes are normal.  Eyes: Conjunctivae normal and EOM are normal. Pupils are equal, round, and reactive to light.  Neck: Normal range of motion. Neck supple.  Cardiovascular: Normal rate, regular rhythm, S1 normal and S2 normal.  Exam reveals no gallop and no friction rub.   No murmur heard. Pulmonary/Chest: Effort normal and breath sounds normal. No respiratory distress. She exhibits no tenderness.  Abdominal: Soft. Normal appearance and bowel sounds are normal. There is no hepatosplenomegaly. There is no tenderness. There is no rebound, no guarding, no tenderness at  McBurney's point and negative Murphy's sign. No hernia.  Musculoskeletal: Normal range of motion.       Dialysis access still in place x2. No active bleeding. Thrill felt at fistula. Area mildly tender, no drainage.  Neurological: She is alert and oriented to person, place, and time. She has normal strength. No cranial nerve deficit or sensory deficit. Coordination normal. GCS eye subscore is 4. GCS verbal subscore is 5. GCS motor subscore is 6.  Skin: Skin is warm, dry and intact. No rash noted. No cyanosis.  Psychiatric: She has a normal mood and affect. Her speech is normal and behavior is normal. Thought content normal.    ED Course  Procedures (including critical care time)   Labs Reviewed  CBC WITH DIFFERENTIAL  BASIC METABOLIC PANEL  PROTIME-INR  APTT   No results found.   Diagnosis: Hemorrhage from Dialysis Shunt    MDM  Patient evaluated immediately at arrival. In no distress, no active bleeding. Dr. Kellie Simmering evaluated the patient immediately at arrival. Will take patient to OR. Pre-operative labs ordered.        Orpah Greek, MD 09/27/12 1021

## 2012-09-27 NOTE — Anesthesia Procedure Notes (Signed)
Procedure Name: Intubation Date/Time: 09/27/2012 11:22 AM Performed by: Williemae Area B Pre-anesthesia Checklist: Patient identified, Emergency Drugs available, Suction available and Patient being monitored Patient Re-evaluated:Patient Re-evaluated prior to inductionOxygen Delivery Method: Circle system utilized Preoxygenation: Pre-oxygenation with 100% oxygen Intubation Type: IV induction Laryngoscope Size: Mac and 3 Grade View: Grade II Tube type: Oral Tube size: 7.5 mm Number of attempts: 1 Airway Equipment and Method: Stylet Placement Confirmation: ETT inserted through vocal cords under direct vision,  breath sounds checked- equal and bilateral and positive ETCO2 Secured at: 21 (cm at teeth) cm Tube secured with: Tape Dental Injury: Teeth and Oropharynx as per pre-operative assessment

## 2012-09-27 NOTE — Preoperative (Signed)
Beta Blockers   Reason not to administer Beta Blockers:Not Applicable 

## 2012-09-27 NOTE — Transfer of Care (Signed)
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  Patient Location: PACU  Anesthesia Type:General  Level of Consciousness: awake, alert , oriented and patient cooperative  Airway & Oxygen Therapy: Patient Spontanous Breathing and Patient connected to face mask oxygen  Post-op Assessment: Report given to PACU RN, Post -op Vital signs reviewed and stable and Patient moving all extremities  Post vital signs: Reviewed and stable  Complications: No apparent anesthesia complications

## 2012-09-27 NOTE — ED Notes (Signed)
Dia

## 2012-09-27 NOTE — Op Note (Signed)
OPERATIVE REPORT  Date of Surgery: 09/27/2012  Surgeon: Tinnie Gens, MD  Assistant: Johnette Abraham and Gerri Lins PA  Pre-op Diagnosis: Bleeding from pseudoaneurysm right arm AV graft with erosion-possible infection end stage renal disease  Post-op Diagnosis: Same-doubt infection of graft  Procedure: Procedure(s): #1 revision AV graft right forearm with replacement venous half of the loop with 6 mm Gore-Tex #2 excision of eroded segment of previous AV graft with pseudoaneurysm and primary closure  Anesthesia: General  EBL: Minimal  Cultures of graft fluid sent to lab  Procedure --patient was taken to the operating room placed in a supine position at which time satisfactory general endotracheal anesthesia was administered. The patient had a functioning AV graft in the right forearm with arterial and venous cannulas in place. The bleeding site had been on the venous half of the loop at about the 4:00 position where there was a pseudoaneurysm which eroded through the skin over about a 1/2 cm in diameter area. After compression proximally on the arterial side of the loop the previous cannulas were removed and nylon sutures were placed at the exit site temporary hemostasis. There was no active bleeding coming from the eroded pseudoaneurysm at this time. After prepping and draping routine sterile manner and carefully observing the arm did not appear that there was infection but rather erosion of the pseudoaneurysm to cause the bleeding. There is no purulent drainage except for one corner of this eroded area which seemed to be superficial and not the involving the graft. Therefore was decided to replace the venous half of the leg. Short longitudinal incision was made at the 6:00 position graft dissected free there was no fluid surrounding the graft did not appear infected. Second incision was made in the distal upper arm of the graft and then revised into the basilic vein. Graft to basilic  vein anastomosis was exposed the vein was thickened for a distance of about 3 cm proximal this is a widely patent 5 mm vein. A new tunnel was created medially within the loop staying away from the eroded segment. After transecting the graft at the 6:00 position the new graft was anastomosed end-to-end with 6-0 Prolene. Following this vein was transected-basilic and the graft was anastomosed in the in the 60. Fogarty catheter had been passed around the graft and the venous and prior to this there is no thrombus within either. After completion of the anastomosis there was good pulse and thrill in the graft. Adequate hemostasis was achieved the wounds were closed in layers with Vicryl subcuticular fashion with Dermabond and covered with a Tegaderm dressing. Attention turned to the eroded segment longitudinal opening made through this area stented proximally and distally about 3-4 cm in both directions. There was no purulent fluid within this pseudoaneurysm. The graft was completely excised from this area and the eroded segment was excised so that the skin could be closed primarily. After mobilizing it medially and laterally adequate hemostasis was achieved and this wound was closed with interrupted 3-0 Vicryl and 3-0 nylon simple sutures for the skin. Sterile dressings are applied patient taken to recovery in stable condition with an excellent pulse and thrill in the graft.  Complications: None  Procedure Details:   Tinnie Gens, MD 09/27/2012 1:01 PM

## 2012-09-27 NOTE — Telephone Encounter (Addendum)
Message copied by Lujean Amel on Wed Sep 27, 2012  2:34 PM ------      Message from: Alfonso Patten      Created: Wed Sep 27, 2012  1:16 PM       Lillie,Can you arrange home health?      ----- Message -----         From: Ulyses Amor, PA         Sent: 09/27/2012  12:59 PM           To: Alfonso Patten, RN            Right arm revision AV graft repair  And replacement of ulnar side of graft.  F/U in 2 weeks with Dr. Kellie Simmering.  Patient needs home health 3 times a week for dressing changes for 1 week.  mc       I scheduled an appt for the above pt on 10/17/12 at 9:15am w/ [facility]. I mailed an appt letter to the patient and also left message for her regarding this appt and also that we cancelled the appt for her on 10/09/12/awt

## 2012-09-27 NOTE — Anesthesia Postprocedure Evaluation (Signed)
Anesthesia Post Note  Patient: Victoria [name]  Procedure(s) Performed: Procedure(s) (LRB): REVISION OF ARTERIOVENOUS GORETEX GRAFT (Right)  Anesthesia type: general  Patient location: PACU  Post pain: Pain level controlled  Post assessment: Patient's Cardiovascular Status Stable  Last Vitals:  Filed Vitals:   09/27/12 1430  BP: 109/81  Pulse: 77  Temp: 36.2 C  Resp: 12    Post vital signs: Reviewed and stable  Level of consciousness: sedated  Complications: No apparent anesthesia complications

## 2012-09-27 NOTE — ED Notes (Signed)
Pt states that last pm she was at home and noticed that her dialysis shunt in her R arm was bleeding.  She was able to stop the bleeding with manual pressure.  This am she went for her dialysis (Mon, Weds, and Fri).  She was able to complete her dialysis this am except for 25 mins. Due to the bleeding.

## 2012-09-27 NOTE — H&P (Signed)
Vascular Surgery H&P  Chief Complaint: Bleeding from AV graft right arm  HPI: Victoria [name] is a 57 y.o. female who presents for evaluation of bleeding from AV graft right arm. Patient has right forearm graft with 2 large pseudoaneurysms. She had a history of drainage from the medial aspect and was seen by Dr. Trula Slade on Friday, 09/22/2012. He started her on antibiotics. Last night she had spontaneous bleeding from one of the false aneurysms which stopped with compression. Today while on dialysis she had a second episode of bleeding which also was controlled by pressure. She now comes to the emergency department for removal of the graft. She denies chills and fever.   Past Medical History  Diagnosis Date  . Hypertension     Now controlled by HD  . Complication of anesthesia ~ 2011    "they gave me a medicine that swolled me up" (08/23/2012)  . Seasonal allergies   . ESRD on dialysis     HD since approx 1992; "[facility]; M, W, F"  . H/O cardiovascular stress test     Myoview 08/24/12: Inferolateral and anteroseptal areas of scar, no findings of ischemia, EF 77%.   Past Surgical History  Procedure Date  . Parathyroidectomy 2012    With implant to left forearm  . Thyroidectomy, partial 2012    Isthmusectomy   . Total abdominal hysterectomy 2007  . Dilation and curettage of uterus 1970's    "when I was pregnant in my tubes" (08/23/2012)  . Thrombectomy and revision of arterioventous (av) goretex  graft 2012, 2013  . Dialysis fistula creation 1992    "left forearm" (08/23/2012)  . Arteriovenous graft placement 1990's; ~ 2011    "left upper arm; right forearm" (08/23/2012)  . Ankle fracture surgery ~ 2009    "right" (08/23/2012)   History   Social History  . Marital Status: Married    Spouse Name: N/A    Number of Children: N/A  . Years of Education: N/A   Social History Main Topics  . Smoking status: Never Smoker   . Smokeless tobacco: Never Used  . Alcohol Use: No   Comment: quit 2007  . Drug Use: No  . Sexually Active: No   Other Topics Concern  . Not on file   Social History Narrative  . No narrative on file   Family History  Problem Relation Age of Onset  . Hypertension Father   . Thyroid disease Father   . Hypertension Sister   . Thyroid disease Sister    Allergies  Allergen Reactions  . Lisinopril Swelling and Other (See Comments)    "made my chest hurt"  . Norvasc (Amlodipine Besylate) Swelling   Prior to Admission medications   Medication Sig Start Date End Date Taking? Authorizing Provider  calcium carbonate (TUMS - DOSED IN MG ELEMENTAL CALCIUM) 500 MG chewable tablet Chew 4 tablets by mouth daily.   Yes Historical Provider, MD     Positive ROS: Occasional chest discomfort and dyspnea on exertion.  All other systems have been reviewed and were otherwise negative with the exception of those mentioned in the HPI and as above.  Physical Exam: Filed Vitals:   09/27/12 1009  BP: 136/81  Pulse: 120  Temp: 98.1 F (36.7 C)  Resp: 18    General: Alert, no acute distress HEENT: Normal for age Cardiovascular: Regular rate and rhythm. Carotid pulses 2+, no bruits audible Respiratory: Clear to auscultation. No cyanosis, no use of accessory musculature GI: No  organomegaly, abdomen is soft and non-tender Skin: No lesions in the area of chief complaint Neurologic: Sensation intact distally Psychiatric: Patient is competent for consent with normal mood and affect Musculoskeletal: No obvious deformities Extremities: Right upper extremity with loop forearm graft with 2 large pseudoaneurysms one at 3:00 one at 9:00. 2 cannulas from dialysis center are in place. Site of bleeding was just distal to the more medial cannula. Aneurysms measure 3 x 2 cm bilaterally.      Assessment/Plan:  #1 we'll take patient to OR immediately for removal of AV graft right forearm with repair brachial artery and insertion temporary hemodialysis  catheter-discussed with patient she understands and would like to proceed   Tinnie Gens, MD 09/27/2012 10:20 AM

## 2012-09-27 NOTE — Anesthesia Preprocedure Evaluation (Addendum)
Anesthesia Evaluation  Patient identified by MRN, date of birth, ID band Patient awake    Reviewed: Allergy & Precautions, H&P , NPO status , Patient's Chart, lab work & pertinent test results, reviewed documented beta blocker date and time   History of Anesthesia Complications Negative for: history of anesthetic complications  Airway Mallampati: II TM Distance: >3 FB Neck ROM: Full    Dental  (+) Teeth Intact, Dental Advisory Given and Missing   Pulmonary neg pulmonary ROS,  + rhonchi         Cardiovascular hypertension, Pt. on medications Rhythm:Regular Rate:Normal     Neuro/Psych negative neurological ROS     GI/Hepatic negative GI ROS, Neg liver ROS,   Endo/Other  Morbid obesity  Renal/GU ESRF and DialysisRenal diseaseOf a 4 hr cycle, pt completed all but 29 minutes this morning     Musculoskeletal   Abdominal   Peds  Hematology   Anesthesia Other Findings This done 1105 09/27/2012  Reproductive/Obstetrics                         Anesthesia Physical Anesthesia Plan  ASA: III and emergent  Anesthesia Plan: General   Post-op Pain Management:    Induction: Intravenous  Airway Management Planned: Oral ETT  Additional Equipment:   Intra-op Plan:   Post-operative Plan: Extubation in OR  Informed Consent: I have reviewed the patients History and Physical, chart, labs and discussed the procedure including the risks, benefits and alternatives for the proposed anesthesia with the patient or authorized representative who has indicated his/her understanding and acceptance.   Dental advisory given  Plan Discussed with: CRNA, Anesthesiologist and Surgeon  Anesthesia Plan Comments:         Anesthesia Quick Evaluation

## 2012-09-28 LAB — TYPE AND SCREEN: Unit division: 0

## 2012-09-29 ENCOUNTER — Encounter (HOSPITAL_COMMUNITY): Payer: Self-pay | Admitting: Pharmacy Technician

## 2012-09-29 ENCOUNTER — Emergency Department (HOSPITAL_COMMUNITY): Payer: Medicare Other

## 2012-09-29 ENCOUNTER — Emergency Department (HOSPITAL_COMMUNITY)
Admission: EM | Admit: 2012-09-29 | Discharge: 2012-09-29 | Disposition: A | Discharge status: Home / Self Care | Payer: Medicare Other | Attending: Emergency Medicine | Admitting: Emergency Medicine

## 2012-09-29 ENCOUNTER — Encounter (HOSPITAL_COMMUNITY): Payer: Self-pay | Admitting: *Deleted

## 2012-09-29 DIAGNOSIS — Z992 Dependence on renal dialysis: Secondary | ICD-10-CM | POA: Insufficient documentation

## 2012-09-29 DIAGNOSIS — Y939 Activity, unspecified: Secondary | ICD-10-CM | POA: Insufficient documentation

## 2012-09-29 DIAGNOSIS — Z23 Encounter for immunization: Secondary | ICD-10-CM | POA: Insufficient documentation

## 2012-09-29 DIAGNOSIS — Y929 Unspecified place or not applicable: Secondary | ICD-10-CM | POA: Insufficient documentation

## 2012-09-29 DIAGNOSIS — Z8709 Personal history of other diseases of the respiratory system: Secondary | ICD-10-CM | POA: Insufficient documentation

## 2012-09-29 DIAGNOSIS — N186 End stage renal disease: Secondary | ICD-10-CM | POA: Insufficient documentation

## 2012-09-29 DIAGNOSIS — I120 Hypertensive chronic kidney disease with stage 5 chronic kidney disease or end stage renal disease: Secondary | ICD-10-CM | POA: Insufficient documentation

## 2012-09-29 DIAGNOSIS — W101XXA Fall (on)(from) sidewalk curb, initial encounter: Secondary | ICD-10-CM | POA: Insufficient documentation

## 2012-09-29 DIAGNOSIS — S82009A Unspecified fracture of unspecified patella, initial encounter for closed fracture: Secondary | ICD-10-CM | POA: Insufficient documentation

## 2012-09-29 LAB — WOUND CULTURE

## 2012-09-29 MED ORDER — TETANUS-DIPHTHERIA TOXOIDS TD 5-2 LFU IM INJ
0.5000 mL | INJECTION | Freq: Once | INTRAMUSCULAR | Status: AC
  Administered 2012-09-29: 0.5 mL via INTRAMUSCULAR
  Filled 2012-09-29: qty 0.5

## 2012-09-29 MED ORDER — OXYCODONE-ACETAMINOPHEN 5-325 MG PO TABS
1.0000 | ORAL_TABLET | Freq: Once | ORAL | Status: AC
  Administered 2012-09-29: 1 via ORAL
  Filled 2012-09-29: qty 1

## 2012-09-29 NOTE — Progress Notes (Signed)
Orthopedic Tech Progress Note Patient Details:  Victoria [name] Jan 01, 1956 [id number]  Ortho Devices Type of Ortho Device: Knee Immobilizer   Katheren Shams 09/29/2012, 1:44 AM

## 2012-09-29 NOTE — ED Notes (Signed)
Per PTAR: pt c/o right knee pain after twisting knee when stepping off a curb.  Limited range of movement.

## 2012-09-29 NOTE — ED Provider Notes (Signed)
History     CSN: [id number]  Arrival date & time 09/29/12  0019   First MD Initiated Contact with Patient 09/29/12 [phone]      Chief Complaint  Patient presents with  . Knee Pain    (Consider location/radiation/quality/duration/timing/severity/associated sxs/prior treatment) HPI Comments: Just prior to arrival the patient had pain in her right knee when she fell striking her knee on the sidewalk off a curb. This was acute in onset, persistent, worse with touching the knee or bending the knee, not associated with numbness or weakness.  The history is provided by the patient and a relative.    Past Medical History  Diagnosis Date  . Hypertension     Now controlled by HD  . Complication of anesthesia ~ 2011    "they gave me a medicine that swolled me up" (08/23/2012)  . Seasonal allergies   . ESRD on dialysis     HD since approx 1992; "[facility]; M, W, F"  . H/O cardiovascular stress test     Myoview 08/24/12: Inferolateral and anteroseptal areas of scar, no findings of ischemia, EF 77%.  . Dialysis patient     Past Surgical History  Procedure Date  . Parathyroidectomy 2012    With implant to left forearm  . Thyroidectomy, partial 2012    Isthmusectomy   . Total abdominal hysterectomy 2007  . Dilation and curettage of uterus 1970's    "when I was pregnant in my tubes" (08/23/2012)  . Thrombectomy and revision of arterioventous (av) goretex  graft 2012, 2013  . Dialysis fistula creation 1992    "left forearm" (08/23/2012)  . Arteriovenous graft placement 1990's; ~ 2011    "left upper arm; right forearm" (08/23/2012)  . Ankle fracture surgery ~ 2009    "right" (08/23/2012)  . Revision of arteriovenous goretex graft 09/27/2012    Procedure: REVISION OF ARTERIOVENOUS GORETEX GRAFT;  Surgeon: Mal Misty, MD;  Location: [facility] OR;  Service: Vascular;  Laterality: Right;  1) Replacement of venous half of loop with 89mm Gortex graft  2) Excision of erroded pseudoaneurysm of graft with  primary closure.    Family History  Problem Relation Age of Onset  . Hypertension Father   . Thyroid disease Father   . Hypertension Sister   . Thyroid disease Sister     History  Substance Use Topics  . Smoking status: Never Smoker   . Smokeless tobacco: Never Used  . Alcohol Use: No     Comment: quit 2007    OB History    Grav Para Term Preterm Abortions TAB SAB Ect Mult Living                  Review of Systems  Musculoskeletal: Positive for joint swelling.  Skin: Negative for rash and wound.  Neurological: Negative for weakness and numbness.    Allergies  Lisinopril and Norvasc  Home Medications   No current outpatient prescriptions on file.  BP 177/68  Pulse 100  Temp 98.3 F (36.8 C) (Oral)  Resp 17  SpO2 97%  Physical Exam  Constitutional:       Well-appearing, nondistressed, uncomfortable appearing  HENT:       Normocephalic, atraumatic  Eyes:       Conjunctiva are clear, no scleral icterus or conjunctival discharge  Neck: Normal range of motion.  Cardiovascular: Regular rhythm.        Mild tachycardia  Pulmonary/Chest: Effort normal and breath sounds normal.  Musculoskeletal: She exhibits tenderness.  Focal tenderness to palpation over the right patella, inability to straight leg raise  Neurological: She is alert. Coordination normal.  Skin: Skin is warm and dry. No rash noted.    ED Course  Procedures (including critical care time)  Labs Reviewed - No data to display Dg Chest 2 View  10/03/2012  *RADIOLOGY REPORT*  Clinical Data: Preop patellar tendon repair  CHEST - 2 VIEW  Comparison: 08/23/2012  Findings: Mild cardiac enlargement.  Negative for heart failure or edema.  No significant pleural effusion.  No infiltrate or mass is present. Interval clearing of right lower lobe airspace density  IMPRESSION: Cardiac enlargement without acute abnormality.   Original Report Authenticated By: Carl Best, M.D.      1. Patella fracture         MDM  Nurse Practitioner Spoke with DR. Supple agrees with knee immobilizer weight bearing as tolerated pain medication and office follow up  I have personally evaluated and examined the pt and agree with findings.         Johnna Acosta, MD 10/05/12 [phone]

## 2012-09-29 NOTE — ED Provider Notes (Signed)
57 year old female who is on dialysis for end-stage renal disease presents with acute onset of right knee pain and deformity after falling onto her right knee. The patient states that this pain was acute in onset, occurred when her right knee struck the cement when she tripped on the curb, states that she is unable to straight leg raise and has significant swelling and pain to her right patellar region.  On exam the patient has tenderness and deformity to the anterior right knee, and is unable to straight leg raise.  She has no pain to her right lower extremity below the knee and no pain on the left lower extremity on my exam. Otherwise she appears well  X-ray of the right knee reviewed and I personally interpreted the x-ray and find her to be a patellar fracture with significant distraction of the inferior segment of the patella.= With a high riding patella concerning for complete fracture of the patella.  The patient will be sent to followup with orthopedics as an outpatient, knee immobilizer ordered, pain medication, stable for discharge  Medical screening examination/treatment/procedure(s) were conducted as a shared visit with non-physician practitioner(s) and myself.  I personally evaluated the patient during the encounter    Johnna Acosta, MD 09/29/12 [phone]

## 2012-10-02 ENCOUNTER — Encounter (HOSPITAL_COMMUNITY): Payer: Self-pay | Admitting: Surgery

## 2012-10-02 MED ORDER — DEXTROSE 5 % IV SOLN
3.0000 g | INTRAVENOUS | Status: AC
  Administered 2012-10-03: 3 g via INTRAVENOUS
  Filled 2012-10-02: qty 3000

## 2012-10-03 ENCOUNTER — Encounter (HOSPITAL_COMMUNITY): Payer: Self-pay | Admitting: Vascular Surgery

## 2012-10-03 ENCOUNTER — Encounter (HOSPITAL_COMMUNITY): Payer: Self-pay | Admitting: *Deleted

## 2012-10-03 ENCOUNTER — Encounter: Payer: Medicare Other | Admitting: Physician Assistant

## 2012-10-03 ENCOUNTER — Ambulatory Visit (HOSPITAL_COMMUNITY)
Admission: RE | Admit: 2012-10-03 | Discharge: 2012-10-04 | Disposition: A | Discharge status: Home / Self Care | Payer: Medicare Other | Source: Ambulatory Visit | Attending: Orthopedic Surgery | Admitting: Orthopedic Surgery

## 2012-10-03 ENCOUNTER — Inpatient Hospital Stay (HOSPITAL_COMMUNITY): Payer: Medicare Other | Admitting: Vascular Surgery

## 2012-10-03 ENCOUNTER — Encounter (HOSPITAL_COMMUNITY)
Admission: RE | Disposition: A | Discharge status: Home / Self Care | Payer: Self-pay | Source: Ambulatory Visit | Attending: Orthopedic Surgery

## 2012-10-03 ENCOUNTER — Inpatient Hospital Stay (HOSPITAL_COMMUNITY): Payer: Medicare Other

## 2012-10-03 DIAGNOSIS — Z01812 Encounter for preprocedural laboratory examination: Secondary | ICD-10-CM | POA: Insufficient documentation

## 2012-10-03 DIAGNOSIS — N2581 Secondary hyperparathyroidism of renal origin: Secondary | ICD-10-CM | POA: Insufficient documentation

## 2012-10-03 DIAGNOSIS — N186 End stage renal disease: Secondary | ICD-10-CM | POA: Insufficient documentation

## 2012-10-03 DIAGNOSIS — X500XXA Overexertion from strenuous movement or load, initial encounter: Secondary | ICD-10-CM | POA: Insufficient documentation

## 2012-10-03 DIAGNOSIS — Z4889 Encounter for other specified surgical aftercare: Secondary | ICD-10-CM | POA: Insufficient documentation

## 2012-10-03 DIAGNOSIS — Z79899 Other long term (current) drug therapy: Secondary | ICD-10-CM | POA: Insufficient documentation

## 2012-10-03 DIAGNOSIS — Y998 Other external cause status: Secondary | ICD-10-CM | POA: Insufficient documentation

## 2012-10-03 DIAGNOSIS — S86819A Strain of other muscle(s) and tendon(s) at lower leg level, unspecified leg, initial encounter: Secondary | ICD-10-CM | POA: Insufficient documentation

## 2012-10-03 DIAGNOSIS — I120 Hypertensive chronic kidney disease with stage 5 chronic kidney disease or end stage renal disease: Secondary | ICD-10-CM | POA: Insufficient documentation

## 2012-10-03 DIAGNOSIS — S838X9A Sprain of other specified parts of unspecified knee, initial encounter: Secondary | ICD-10-CM | POA: Insufficient documentation

## 2012-10-03 DIAGNOSIS — D649 Anemia, unspecified: Secondary | ICD-10-CM | POA: Insufficient documentation

## 2012-10-03 DIAGNOSIS — Z992 Dependence on renal dialysis: Secondary | ICD-10-CM | POA: Insufficient documentation

## 2012-10-03 DIAGNOSIS — S82009A Unspecified fracture of unspecified patella, initial encounter for closed fracture: Principal | ICD-10-CM | POA: Insufficient documentation

## 2012-10-03 DIAGNOSIS — Y92410 Unspecified street and highway as the place of occurrence of the external cause: Secondary | ICD-10-CM | POA: Insufficient documentation

## 2012-10-03 DIAGNOSIS — Z01818 Encounter for other preprocedural examination: Secondary | ICD-10-CM | POA: Insufficient documentation

## 2012-10-03 HISTORY — PX: PATELLECTOMY: SHX1022

## 2012-10-03 HISTORY — PX: PATELLAR TENDON REPAIR: SHX737

## 2012-10-03 LAB — POCT I-STAT 4, (NA,K, GLUC, HGB,HCT)
Glucose, Bld: 92 mg/dL (ref 70–99)
Hemoglobin: 9.9 g/dL — ABNORMAL LOW (ref 12.0–15.0)
Potassium: 4.4 mEq/L (ref 3.5–5.1)

## 2012-10-03 SURGERY — PATELLECTOMY
Anesthesia: Regional | Site: Knee | Laterality: Right | Wound class: Clean

## 2012-10-03 MED ORDER — OXYCODONE HCL 5 MG PO TABS
5.0000 mg | ORAL_TABLET | ORAL | Status: DC | PRN
  Administered 2012-10-03: 10 mg via ORAL
  Filled 2012-10-03: qty 2

## 2012-10-03 MED ORDER — VANCOMYCIN HCL IN DEXTROSE 1-5 GM/200ML-% IV SOLN
1000.0000 mg | INTRAVENOUS | Status: DC
  Administered 2012-10-04: 1000 mg via INTRAVENOUS
  Filled 2012-10-03: qty 200

## 2012-10-03 MED ORDER — ONDANSETRON HCL 4 MG/2ML IJ SOLN
INTRAMUSCULAR | Status: DC | PRN
  Administered 2012-10-03: 4 mg via INTRAVENOUS

## 2012-10-03 MED ORDER — FENTANYL CITRATE 0.05 MG/ML IJ SOLN
INTRAMUSCULAR | Status: DC | PRN
  Administered 2012-10-03: 25 ug via INTRAVENOUS
  Administered 2012-10-03: 50 ug via INTRAVENOUS

## 2012-10-03 MED ORDER — PHENYLEPHRINE HCL 10 MG/ML IJ SOLN
INTRAMUSCULAR | Status: DC | PRN
  Administered 2012-10-03 (×2): 80 ug via INTRAVENOUS
  Administered 2012-10-03: 120 ug via INTRAVENOUS

## 2012-10-03 MED ORDER — HYDROMORPHONE HCL PF 1 MG/ML IJ SOLN: 0.5000 mg | INTRAMUSCULAR | Status: DC | PRN

## 2012-10-03 MED ORDER — MIDAZOLAM HCL 5 MG/5ML IJ SOLN
INTRAMUSCULAR | Status: DC | PRN
  Administered 2012-10-03: 1 mg via INTRAVENOUS

## 2012-10-03 MED ORDER — SODIUM CHLORIDE 0.9 % IV SOLN
10.0000 mg | INTRAVENOUS | Status: DC | PRN
  Administered 2012-10-03: 50 ug/min via INTRAVENOUS

## 2012-10-03 MED ORDER — FENTANYL CITRATE 0.05 MG/ML IJ SOLN
INTRAMUSCULAR | Status: AC
  Filled 2012-10-03: qty 2

## 2012-10-03 MED ORDER — METOCLOPRAMIDE HCL 5 MG/ML IJ SOLN
5.0000 mg | Freq: Three times a day (TID) | INTRAMUSCULAR | Status: DC | PRN
  Filled 2012-10-03: qty 2

## 2012-10-03 MED ORDER — MUPIROCIN 2 % EX OINT
TOPICAL_OINTMENT | Freq: Two times a day (BID) | CUTANEOUS | Status: DC
  Administered 2012-10-03: 13:00:00 via NASAL
  Filled 2012-10-03: qty 22

## 2012-10-03 MED ORDER — ARTIFICIAL TEARS OP OINT
TOPICAL_OINTMENT | OPHTHALMIC | Status: DC | PRN
  Administered 2012-10-03: 1 via OPHTHALMIC

## 2012-10-03 MED ORDER — ONDANSETRON HCL 4 MG PO TABS: 4.0000 mg | ORAL_TABLET | Freq: Four times a day (QID) | ORAL | Status: DC | PRN

## 2012-10-03 MED ORDER — PARICALCITOL 5 MCG/ML IV SOLN
4.0000 ug | INTRAVENOUS | Status: DC
  Administered 2012-10-04: 4 ug via INTRAVENOUS
  Filled 2012-10-03: qty 0.8

## 2012-10-03 MED ORDER — SODIUM CHLORIDE 0.9 % IV SOLN
INTRAVENOUS | Status: DC
  Administered 2012-10-03 (×2): via INTRAVENOUS

## 2012-10-03 MED ORDER — LIDOCAINE HCL (CARDIAC) 20 MG/ML IV SOLN
INTRAVENOUS | Status: DC | PRN
  Administered 2012-10-03: 30 mg via INTRAVENOUS

## 2012-10-03 MED ORDER — HYDROMORPHONE HCL PF 1 MG/ML IJ SOLN
INTRAMUSCULAR | Status: AC
  Filled 2012-10-03: qty 1

## 2012-10-03 MED ORDER — METOCLOPRAMIDE HCL 5 MG PO TABS
5.0000 mg | ORAL_TABLET | Freq: Three times a day (TID) | ORAL | Status: DC | PRN
Start: 2012-10-03 — End: 2012-10-04
  Filled 2012-10-03: qty 2

## 2012-10-03 MED ORDER — SUCCINYLCHOLINE CHLORIDE 20 MG/ML IJ SOLN
INTRAMUSCULAR | Status: DC | PRN
  Administered 2012-10-03: 120 mg via INTRAVENOUS

## 2012-10-03 MED ORDER — HYDROMORPHONE HCL PF 1 MG/ML IJ SOLN
0.2500 mg | INTRAMUSCULAR | Status: DC | PRN
  Administered 2012-10-03 (×3): 0.5 mg via INTRAVENOUS

## 2012-10-03 MED ORDER — DOCUSATE SODIUM 100 MG PO CAPS
100.0000 mg | ORAL_CAPSULE | Freq: Two times a day (BID) | ORAL | Status: DC
  Administered 2012-10-03 – 2012-10-04 (×2): 100 mg via ORAL
  Filled 2012-10-03 (×3): qty 1

## 2012-10-03 MED ORDER — MIDAZOLAM HCL 2 MG/2ML IJ SOLN: 0.5000 mg | INTRAMUSCULAR | Status: DC | PRN

## 2012-10-03 MED ORDER — PROPOFOL 10 MG/ML IV BOLUS
INTRAVENOUS | Status: DC | PRN
  Administered 2012-10-03: 200 mg via INTRAVENOUS

## 2012-10-03 MED ORDER — LACTATED RINGERS IV SOLN: INTRAVENOUS | Status: DC

## 2012-10-03 MED ORDER — MIDAZOLAM HCL 2 MG/2ML IJ SOLN
INTRAMUSCULAR | Status: AC
  Filled 2012-10-03: qty 2

## 2012-10-03 MED ORDER — RENA-VITE PO TABS: 1.0000 | ORAL_TABLET | Freq: Every day | ORAL | Status: DC

## 2012-10-03 MED ORDER — BUPIVACAINE-EPINEPHRINE PF 0.5-1:200000 % IJ SOLN
INTRAMUSCULAR | Status: DC | PRN
  Administered 2012-10-03: 30 mL

## 2012-10-03 MED ORDER — FENTANYL CITRATE 0.05 MG/ML IJ SOLN: 50.0000 ug | INTRAMUSCULAR | Status: DC | PRN

## 2012-10-03 MED ORDER — DARBEPOETIN ALFA-POLYSORBATE 40 MCG/0.4ML IJ SOLN
40.0000 ug | INTRAMUSCULAR | Status: DC
  Administered 2012-10-04: 40 ug via INTRAVENOUS
  Filled 2012-10-03: qty 0.4

## 2012-10-03 MED ORDER — CEFUROXIME SODIUM 1.5 G IJ SOLR
1.5000 g | INTRAMUSCULAR | Status: DC
  Filled 2012-10-03: qty 1.5

## 2012-10-03 MED ORDER — 0.9 % SODIUM CHLORIDE (POUR BTL) OPTIME
TOPICAL | Status: DC | PRN
  Administered 2012-10-03: 1000 mL

## 2012-10-03 MED ORDER — CHLORHEXIDINE GLUCONATE 4 % EX LIQD: 60.0000 mL | Freq: Once | CUTANEOUS | Status: DC

## 2012-10-03 MED ORDER — ONDANSETRON HCL 4 MG/2ML IJ SOLN: 4.0000 mg | Freq: Four times a day (QID) | INTRAMUSCULAR | Status: DC | PRN

## 2012-10-03 MED ORDER — RENA-VITE PO TABS
1.0000 | ORAL_TABLET | Freq: Every day | ORAL | Status: DC
  Administered 2012-10-03: 1 via ORAL
  Filled 2012-10-03 (×2): qty 1

## 2012-10-03 SURGICAL SUPPLY — 69 items
BANDAGE ELASTIC 4 VELCRO ST LF (GAUZE/BANDAGES/DRESSINGS) IMPLANT
BANDAGE ELASTIC 6 VELCRO ST LF (GAUZE/BANDAGES/DRESSINGS) ×1 IMPLANT
BANDAGE ESMARK 6X9 LF (GAUZE/BANDAGES/DRESSINGS) IMPLANT
BANDAGE GAUZE ELAST BULKY 4 IN (GAUZE/BANDAGES/DRESSINGS) IMPLANT
BIT DRILL 100X2XQC STRL (BIT) IMPLANT
BIT DRILL QC 2.0X100 (BIT) ×2
BIT DRL 100X2XQC STRL (BIT) ×1
BNDG CMPR 9X4 STRL LF SNTH (GAUZE/BANDAGES/DRESSINGS)
BNDG CMPR 9X6 STRL LF SNTH (GAUZE/BANDAGES/DRESSINGS) ×1
BNDG ESMARK 4X9 LF (GAUZE/BANDAGES/DRESSINGS) IMPLANT
BNDG ESMARK 6X9 LF (GAUZE/BANDAGES/DRESSINGS) ×2
CLOTH BEACON ORANGE TIMEOUT ST (SAFETY) ×2 IMPLANT
COVER SURGICAL LIGHT HANDLE (MISCELLANEOUS) ×2 IMPLANT
CUFF TOURNIQUET SINGLE 34IN LL (TOURNIQUET CUFF) ×1 IMPLANT
CUFF TOURNIQUET SINGLE 44IN (TOURNIQUET CUFF) IMPLANT
DRAPE INCISE IOBAN 66X45 STRL (DRAPES) ×1 IMPLANT
DRAPE U-SHAPE 47X51 STRL (DRAPES) ×2 IMPLANT
DRSG ADAPTIC 3X8 NADH LF (GAUZE/BANDAGES/DRESSINGS) ×2 IMPLANT
DRSG PAD ABDOMINAL 8X10 ST (GAUZE/BANDAGES/DRESSINGS) ×1 IMPLANT
DURAPREP 26ML APPLICATOR (WOUND CARE) ×2 IMPLANT
ELECT REM PT RETURN 9FT ADLT (ELECTROSURGICAL) ×2
ELECTRODE REM PT RTRN 9FT ADLT (ELECTROSURGICAL) ×1 IMPLANT
GLOVE BIO SURGEON STRL SZ7.5 (GLOVE) ×2 IMPLANT
GLOVE BIO SURGEON STRL SZ8 (GLOVE) ×2 IMPLANT
GLOVE BIOGEL PI IND STRL 7.0 (GLOVE) IMPLANT
GLOVE BIOGEL PI INDICATOR 7.0 (GLOVE) ×1
GLOVE EUDERMIC 7 POWDERFREE (GLOVE) ×2 IMPLANT
GLOVE SS BIOGEL STRL SZ 7.5 (GLOVE) ×1 IMPLANT
GLOVE SUPERSENSE BIOGEL SZ 7.5 (GLOVE) ×1
GLOVE SURG SS PI 7.0 STRL IVOR (GLOVE) ×3 IMPLANT
GOWN STRL NON-REIN LRG LVL3 (GOWN DISPOSABLE) ×2 IMPLANT
GOWN STRL REIN XL XLG (GOWN DISPOSABLE) ×6 IMPLANT
IMMOBILIZER KNEE 22  40 CIR (ORTHOPEDIC SUPPLIES) ×1
IMMOBILIZER KNEE 22 40 CIR (ORTHOPEDIC SUPPLIES) IMPLANT
KIT BASIN OR (CUSTOM PROCEDURE TRAY) ×2 IMPLANT
KIT ROOM TURNOVER OR (KITS) ×2 IMPLANT
MANIFOLD NEPTUNE II (INSTRUMENTS) ×2 IMPLANT
NDL KEITH (NEEDLE) IMPLANT
NEEDLE 22X1 1/2 (OR ONLY) (NEEDLE) IMPLANT
NEEDLE KEITH (NEEDLE) ×2 IMPLANT
NS IRRIG 1000ML POUR BTL (IV SOLUTION) ×2 IMPLANT
PACK ORTHO EXTREMITY (CUSTOM PROCEDURE TRAY) ×2 IMPLANT
PAD ARMBOARD 7.5X6 YLW CONV (MISCELLANEOUS) ×3 IMPLANT
PAD CAST 3X4 CTTN HI CHSV (CAST SUPPLIES) IMPLANT
PAD CAST 4YDX4 CTTN HI CHSV (CAST SUPPLIES) IMPLANT
PADDING CAST COTTON 3X4 STRL (CAST SUPPLIES)
PADDING CAST COTTON 4X4 STRL (CAST SUPPLIES) ×2
PASSER SUT SWANSON 36MM LOOP (INSTRUMENTS) ×2 IMPLANT
SPONGE GAUZE 4X4 12PLY (GAUZE/BANDAGES/DRESSINGS) ×1 IMPLANT
SPONGE LAP 18X18 X RAY DECT (DISPOSABLE) ×1 IMPLANT
SPONGE LAP 4X18 X RAY DECT (DISPOSABLE) ×3 IMPLANT
STAPLER VISISTAT 35W (STAPLE) IMPLANT
STOCKINETTE IMPERVIOUS 9X36 MD (GAUZE/BANDAGES/DRESSINGS) ×2 IMPLANT
STRIP CLOSURE SKIN 1/2X4 (GAUZE/BANDAGES/DRESSINGS) ×2 IMPLANT
SUCTION FRAZIER TIP 10 FR DISP (SUCTIONS) ×2 IMPLANT
SUT ETHIBOND 2 OS 4 DA (SUTURE) ×1 IMPLANT
SUT FIBERWIRE #2 38 T-5 BLUE (SUTURE) ×4
SUT MNCRL AB 3-0 PS2 18 (SUTURE) ×2 IMPLANT
SUT VIC AB 1 CT1 27 (SUTURE) ×2
SUT VIC AB 1 CT1 27XBRD ANBCTR (SUTURE) IMPLANT
SUT VIC AB 2-0 CT1 27 (SUTURE) ×4
SUT VIC AB 2-0 CT1 TAPERPNT 27 (SUTURE) ×1 IMPLANT
SUTURE FIBERWR #2 38 T-5 BLUE (SUTURE) ×1 IMPLANT
SYR CONTROL 10ML LL (SYRINGE) IMPLANT
TOWEL OR 17X24 6PK STRL BLUE (TOWEL DISPOSABLE) ×2 IMPLANT
TOWEL OR 17X26 10 PK STRL BLUE (TOWEL DISPOSABLE) ×2 IMPLANT
TUBE CONNECTING 12X1/4 (SUCTIONS) ×2 IMPLANT
WATER STERILE IRR 1000ML POUR (IV SOLUTION) ×1 IMPLANT
YANKAUER SUCT BULB TIP NO VENT (SUCTIONS) ×1 IMPLANT

## 2012-10-03 NOTE — Preoperative (Signed)
Beta Blockers   Reason not to administer Beta Blockers:Not Applicable 

## 2012-10-03 NOTE — Progress Notes (Signed)
Subjective:  57 yo BF admittted post op Right Patella Tendon Avulsion with repair by Dr. Onnie Graham".Last Thursday stepped off Curb wrong" She is on hd MWF [facility]  With Jan .03, 2014 Blood cultures pos. For MSSA  And group B Strep , has been on Vancomycin q hd since for 3 weeks therapy (jan 24 last dose) . Note she had an infected right F A  AVGG   with jan 08 / Dr.Lawson   Repair/  revision . Objective Vital signs in last 24 hours: Filed Vitals:   10/02/12 1323 10/03/12 1144 10/03/12 1530 10/03/12 1545  BP:  128/80 140/75 123/75  Pulse:  101 103 98  Temp:  98.4 F (36.9 C)    TempSrc:  Oral    Resp:  18 17 18   Height: 5\' 3"  (1.6 m)     Weight: 91.627 kg (202 lb)     SpO2:  98% 100% 100%   Weight change:   Intake/Output Summary (Last 24 hours) at 10/03/12 1612 Last data filed at 10/03/12 1452  Gross per 24 hour  Intake    500 ml  Output      0 ml  Net    500 ml   Labs: Basic Metabolic Panel:  Lab [id number] 1230 09/27/12 1025  NA 140 139  K 4.4 3.4*  CL -- 98  CO2 -- 28  GLUCOSE 92 101*  BUN -- 17  CREATININE -- 5.38*  CALCIUM -- 11.1*  ALB -- --  PHOS -- --   Liver Function Tests: No results found for this basename: AST:3,ALT:3,ALKPHOS:3,BILITOT:3,PROT:3,ALBUMIN:3 in the last 168 hours No results found for this basename: LIPASE:3,AMYLASE:3 in the last 168 hours No results found for this basename: AMMONIA:3 in the last 168 hours CBC:  Lab 10/03/12 1230 09/27/12 1025  WBC -- 11.1*  NEUTROABS -- 6.8  HGB 9.9* 12.8  HCT 29.0* 38.1  MCV -- 80.7  PLT -- 230   Cardiac Enzymes: No results found for this basename: CKTOTAL:5,CKMB:5,CKMBINDEX:5,TROPONINI:5 in the last 168 hours CBG:  Lab 10/03/12 1538  GLUCAP 97    Iron Studies: No results found for this basename: IRON,TIBC,TRANSFERRIN,FERRITIN in the last 72 hours Studies/Results: Dg Chest 2 View  10/03/2012  *RADIOLOGY REPORT*  Clinical Data: Preop patellar tendon repair  CHEST - 2 VIEW  Comparison: 08/23/2012   Findings: Mild cardiac enlargement.  Negative for heart failure or edema.  No significant pleural effusion.  No infiltrate or mass is present. Interval clearing of right lower lobe airspace density  IMPRESSION: Cardiac enlargement without acute abnormality.   Original Report Authenticated By: Carl Best, M.D.    Medications:    . sodium chloride    . lactated ringers        . chlorhexidine  60 mL Topical Once  . HYDROmorphone      . mupirocin ointment   Nasal BID   I  have reviewed scheduled and prn medications.  Physical Exam: General: Alert post op in recovery room,, NAD, Appropriate, Adult BF Heart: RRR, no rub or Murmur Lungs: CTA BILAT Abdomen:  Bs==, soft , nontender Extremities: Dialysis Access: Righ tLeg / Patella in  Brace, no pedal edema, Positive bruit Right Forearm AVGG with minimal swelling  from  Prior Surgery    HD ORDERS=  [facility], MWF , EDW 91.0 kg, bfr 450 dfr 800,  Bath = @.0 K, 3.5 CA Heparin 2 ml and .5 intermittently , hectoral 74mcg , epo 0, vancomycin 1 gm q hd/ Access= R FA AVGG /  Problem/Plan: 1. SP Partial Patellectomy and Patellar Tendon Repair = per Dr. Onnie Graham possible dc in am 2. Ho MSSA and Strep B bacteriemia (from outpatient blood cultures- inpatient blood and wound cx's were negative) related to bleeding AVG which was operated on by [facility] Jan 8 with replacement of venous limb and repair of pseudoaneurysm. Now is getting Vancomycin 1 gm q hd, will check level and continue on HD through jan 24( 3 week therapy) 3. ESRD - MWF hd [facility] 4. Anemia -  hgb 9.9/ aranesp  Check pre hd 5. Secondary hyperparathyroidism - hectoral 28mcg  q hd/ binders tums ultra 4 ac 6. HTN/volume - uf on hd to [place], PA-C [place] [phone] 10/03/2012,4:12 PM  LOS: 0 days   Patient seen, examined and reviewed with Ernest Haber, P.A.Marland Kitchen, with additions as indicated. Kelly Splinter  MD [facility] [phone] pgr    [phone]  cell 10/03/2012, 6:48 PM

## 2012-10-03 NOTE — Progress Notes (Signed)
Received pt. From [facility],pt. Alert and oriented,from home with husband.

## 2012-10-03 NOTE — Anesthesia Procedure Notes (Addendum)
Anesthesia Regional Block:  Femoral nerve block  Pre-Anesthetic Checklist: ,, timeout performed, Correct Patient, Correct Site, Correct Laterality, Correct Procedure, Correct Position, site marked, Risks and benefits discussed, pre-op evaluation,  At surgeon's request and post-op pain management  Laterality: Right  Prep: Maximum Sterile Barrier Precautions used and chloraprep       Needles:  Injection technique: Single-shot  Needle Type: Echogenic Stimulator Needle      Needle Gauge: 22 and 22 G    Additional Needles:  Procedures: ultrasound guided (picture in chart) and nerve stimulator Femoral nerve block  Nerve Stimulator or Paresthesia:  Response: Patellar respose, 0.4 mA,   Additional Responses:   Narrative:  Start time: 10/03/2012 1:50 PM End time: 10/03/2012 2:00 PM Injection made incrementally with aspirations every 5 mL. Anesthesiologist: Fitzgerald,MD  Additional Notes: 2% Lidocaine skin wheel.   Femoral nerve block Procedure Name: Intubation Date/Time: 10/03/2012 2:12 PM Performed by: Neldon Newport Pre-anesthesia Checklist: Patient identified, Timeout performed, Emergency Drugs available, Suction available and Patient being monitored Patient Re-evaluated:Patient Re-evaluated prior to inductionOxygen Delivery Method: Circle system utilized Preoxygenation: Pre-oxygenation with 100% oxygen Intubation Type: IV induction Ventilation: Mask ventilation without difficulty Laryngoscope Size: Mac and 3 Grade View: Grade I Tube type: Oral Tube size: 7.5 mm Number of attempts: 1 Placement Confirmation: ETT inserted through vocal cords under direct vision,  positive ETCO2 and breath sounds checked- equal and bilateral Secured at: 1 cm Tube secured with: Tape Dental Injury: Teeth and Oropharynx as per pre-operative assessment

## 2012-10-03 NOTE — Transfer of Care (Signed)
Immediate Anesthesia Transfer of Care Note  Patient: Victoria [name]  Procedure(s) Performed: Procedure(s) (LRB) with comments: PATELLECTOMY (Right) - RIGHT PARTIAL PATELLECTOMY AND PATELLA TENDON REPAIR PATELLA TENDON REPAIR (Right)  Patient Location: PACU  Anesthesia Type:General  Level of Consciousness: awake and alert   Airway & Oxygen Therapy: Patient Spontanous Breathing and Patient connected to face mask oxygen  Post-op Assessment: Report given to PACU RN and Post -op Vital signs reviewed and stable  Post vital signs: Reviewed and stable  Complications: No apparent anesthesia complications

## 2012-10-03 NOTE — H&P (Signed)
Victoria [name]    Chief Complaint: RIGHT PATELLA TENDON AVULSION HPI: The patient is a 57 y.o. female with a right inferior pole patella fracture  Past Medical History  Diagnosis Date  . Hypertension     Now controlled by HD  . Complication of anesthesia ~ 2011    "they gave me a medicine that swolled me up" (08/23/2012)  . Seasonal allergies   . ESRD on dialysis     HD since approx 1992; "Victoria [name]; M, W, F"  . H/O cardiovascular stress test     Myoview 08/24/12: Inferolateral and anteroseptal areas of scar, no findings of ischemia, EF 77%.  . Dialysis patient     Past Surgical History  Procedure Date  . Parathyroidectomy 2012    With implant to left forearm  . Thyroidectomy, partial 2012    Isthmusectomy   . Total abdominal hysterectomy 2007  . Dilation and curettage of uterus 1970's    "when I was pregnant in my tubes" (08/23/2012)  . Thrombectomy and revision of arterioventous (av) goretex  graft 2012, 2013  . Dialysis fistula creation 1992    "left forearm" (08/23/2012)  . Arteriovenous graft placement 1990's; ~ 2011    "left upper arm; right forearm" (08/23/2012)  . Ankle fracture surgery ~ 2009    "right" (08/23/2012)  . Revision of arteriovenous goretex graft 09/27/2012    Procedure: REVISION OF ARTERIOVENOUS GORETEX GRAFT;  Surgeon: Mal Misty, MD;  Location: [facility] OR;  Service: Vascular;  Laterality: Right;  1) Replacement of venous half of loop with 34mm Gortex graft  2) Excision of erroded pseudoaneurysm of graft with primary closure.    Family History  Problem Relation Age of Onset  . Hypertension Father   . Thyroid disease Father   . Hypertension Sister   . Thyroid disease Sister     Social History:  reports that she has never smoked. She has never used smokeless tobacco. She reports that she does not drink alcohol or use illicit drugs.  Allergies:  Allergies  Allergen Reactions  . Lisinopril Swelling and Other (See Comments)    "made my chest hurt"  .  Norvasc (Amlodipine Besylate) Swelling    Medications Prior to Admission  Medication Sig Dispense Refill  . calcium carbonate (TUMS - DOSED IN MG ELEMENTAL CALCIUM) 500 MG chewable tablet Chew 4 tablets by mouth daily.      Marland Kitchen oxyCODONE (OXY IR/ROXICODONE) 5 MG immediate release tablet Take 5 mg by mouth once as needed. For pain         Physical Exam: right knee with patellar tendon defect, patella alta, n/v intact  Vitals  Temp:  [98.4 F (36.9 C)] 98.4 F (36.9 C) (01/14 1144) Pulse Rate:  [101] 101  (01/14 1144) Resp:  [18] 18  (01/14 1144) BP: (128)/(80) 128/80 mmHg (01/14 1144) SpO2:  [98 %] 98 % (01/14 1144)  Assessment/Plan  Impression: RIGHT PATELLA TENDON AVULSION  Plan of Action: Procedure(s): PATELLECTOMY PATELLA TENDON REPAIR  Jazmynn Pho M 10/03/2012, 1:50 PM

## 2012-10-03 NOTE — Anesthesia Preprocedure Evaluation (Addendum)
Anesthesia Evaluation  Patient identified by MRN, date of birth, ID band Patient awake    Reviewed: Allergy & Precautions, H&P , NPO status , Patient's Chart, lab work & pertinent test results  Airway Mallampati: II TM Distance: >3 FB Neck ROM: Full    Dental No notable dental hx. (+) Teeth Intact and Dental Advisory Given   Pulmonary neg pulmonary ROS,  breath sounds clear to auscultation  Pulmonary exam normal       Cardiovascular hypertension, On Medications Rhythm:Regular Rate:Normal     Neuro/Psych negative neurological ROS  negative psych ROS   GI/Hepatic negative GI ROS, Neg liver ROS,   Endo/Other  negative endocrine ROSMorbid obesity  Renal/GU ESRF and DialysisRenal disease  negative genitourinary   Musculoskeletal   Abdominal   Peds  Hematology negative hematology ROS (+)   Anesthesia Other Findings   Reproductive/Obstetrics negative OB ROS                          Anesthesia Physical Anesthesia Plan  ASA: III  Anesthesia Plan: General and Regional   Post-op Pain Management:    Induction: Intravenous  Airway Management Planned: LMA  Additional Equipment:   Intra-op Plan:   Post-operative Plan: Extubation in OR  Informed Consent: I have reviewed the patients History and Physical, chart, labs and discussed the procedure including the risks, benefits and alternatives for the proposed anesthesia with the patient or authorized representative who has indicated his/her understanding and acceptance.   Dental advisory given  Plan Discussed with: CRNA  Anesthesia Plan Comments:         Anesthesia Quick Evaluation

## 2012-10-03 NOTE — Op Note (Signed)
10/03/2012  3:17 PM  PATIENT:   Victoria [name]  57 y.o. female  PRE-OPERATIVE DIAGNOSIS:  RIGHT PATELLA TENDON AVULSION  POST-OPERATIVE DIAGNOSIS:  Same  PROCEDURE:  Partial patellectomy and patellar tendon repair  SURGEON:  Nikkie Liming, Metta Clines. M.D.  ASSISTANTS: Shuford pac   ANESTHESIA:   GET + FNB  EBL: min  SPECIMEN:  none  Drains: none  TT <1hr   PATIENT DISPOSITION:  PACU - hemodynamically stable.    PLAN OF CARE: Admit for overnight observation  Dictation# [id number]

## 2012-10-04 LAB — RENAL FUNCTION PANEL
BUN: 47 mg/dL — ABNORMAL HIGH (ref 6–23)
Creatinine, Ser: 12.13 mg/dL — ABNORMAL HIGH (ref 0.50–1.10)
Glucose, Bld: 103 mg/dL — ABNORMAL HIGH (ref 70–99)
Phosphorus: 4.4 mg/dL (ref 2.3–4.6)
Potassium: 4.8 mEq/L (ref 3.5–5.1)

## 2012-10-04 LAB — CBC
HCT: 26.9 % — ABNORMAL LOW (ref 36.0–46.0)
MCV: 82.3 fL (ref 78.0–100.0)
RDW: 14.9 % (ref 11.5–15.5)
WBC: 12 10*3/uL — ABNORMAL HIGH (ref 4.0–10.5)

## 2012-10-04 MED ORDER — DARBEPOETIN ALFA-POLYSORBATE 40 MCG/0.4ML IJ SOLN
INTRAMUSCULAR | Status: AC
  Administered 2012-10-04: 40 ug via INTRAVENOUS
  Filled 2012-10-04: qty 0.4

## 2012-10-04 MED ORDER — PARICALCITOL 5 MCG/ML IV SOLN
INTRAVENOUS | Status: AC
  Filled 2012-10-04: qty 1

## 2012-10-04 NOTE — Op Note (Signed)
NAMEADASYN, CAPPUCCI                ACCOUNT NO.:  [id number]  MEDICAL RECORD NO.:  [id number]  LOCATION:  6729                         FACILITY:  [facility]  PHYSICIAN:  Metta Clines. Brentton Wardlow, M.D.  DATE OF BIRTH:  06/18/1956  DATE OF PROCEDURE:  10/03/2012 DATE OF DISCHARGE:                              OPERATIVE REPORT   POSTOPERATIVE DIAGNOSIS:  Right inferior pole patella fracture.  POSTOP DIAGNOSIS:  Right inferior pole patella fracture.  PROCEDURE:  Right knee partial patellectomy and patellar tendon repair.  SURGEON:  Metta Clines. Seger Jani, M.D.  Terrence DupontOlivia Mackie A. Shuford, PA-C.  ANESTHESIA:  General endotracheal as well as a femoral nerve block.  TOURNIQUET TIME:  1 hour.  BLOOD LOSS:  Minimal.  DRAINS:  None.  HISTORY:  Ms. Ekern is a 57 year old female, chronic dialysis patient stepped off her curb last week, had a right knee "gave way" and subsequently fell and had immediate complaints of right knee pain and inability to extend the knee.  Evaluation in the emergency room revealed a displaced inferior pole patella fracture.  She is placed in knee immobilizer.  Follow up in our office where we reviewed with Ms. Merryfield treatment options and risks versus benefits thereof.  She is brought to the OR at this time for planned partial patellectomy and patellar tendon repair.  I preoperatively counseled Ms. Clutter on treatment options and risks versus benefits thereof.  Possible complications reviewed including potential for bleeding, infection, neurovascular injury, DVT, PE, persistent pain, loss of motion, recurrence of the tendon rupture, anesthetic complication, possible need for additional surgery.  She understands and accepts and agrees to planned procedure.  PROCEDURE IN DETAIL:  After undergoing routine preop evaluation, the patient received prophylactic antibiotics and a femoral nerve block was established in holding area by the Anesthesia Department.  Placed supine on  the operating table, underwent smooth induction of a general endotracheal anesthesia.  Turned by the right thigh.  Right leg was sterilely prepped and draped in standard fashion.  Time-out was called. The leg was exsanguinated.  Tourniquet inflated 350 mmHg.  An anterior midline incision was made approximately 10 cm in length centered over the patella and patellar tendon region.  Skin flaps were elevated. Dissection carried down, exposing the patella and the patellar tendon. The peritenon was reflected medially and laterally.  The fracture site was readily exposed.  The large hemarthrosis was evacuated and clot at the fracture site was removed.  The knee joint was then copiously irrigated.  No obvious chondral injuries.  We debrided the inferior pole of the patella, and also cleaned the inferior portion of the body of the patella and interpose soft tissue.  I then placed parallel whip stitches using modified Krackow grasping stitch through the patellar tendon. Made 3 longitudinal drill holes through the patella and then the suture limbs were passed through the patellar tunnels and brought out superiorly.  We then tensioned the construct and flexed the knee to 60 degrees several times to remove all elasticity from the suture construct and then went ahead and tied both of the sutures and these were performed utilizing #2 FiberWire sutures.  Excellent apposition was achieved.  We then  repaired the medial retinaculum with figure-of-eight #1 Vicryl sutures, 2-0 Vicryl used to approximate the peritenon, 2-0 Vicryl for subcu layer and intracuticular 3-0 Monocryl for the skin followed by Steri-Strips.  Dry dressings were applied.  Right lower extremities placed into a knee immobilizer.  The patient was then let down.  The patient was awakened, extubated, and taken to recovery room in stable condition.     Metta Clines. Sarafina Puthoff, M.D.     KMS/MEDQ  D:  10/03/2012  T:  10/04/2012  Job:  [id number]

## 2012-10-04 NOTE — Progress Notes (Signed)
Subjective:  On hd , minimal post op knee pain Objective Vital signs in last 24 hours: Filed Vitals:   10/04/12 0715 10/04/12 0716 10/04/12 0745 10/04/12 0815  BP: 105/67 110/70 95/55 97/60   Pulse: 110 110 109 107  Temp: 99.2 F (37.3 C)     TempSrc: Oral     Resp: 18 18 18 18   Height:      Weight:      SpO2: 95%      Weight change: 3.673 kg (8 lb 1.6 oz)  Intake/Output Summary (Last 24 hours) at 10/04/12 0850 Last data filed at 10/03/12 1930  Gross per 24 hour  Intake    740 ml  Output      0 ml  Net    740 ml   Labs: Basic Metabolic Panel:  Lab [id number] 0719 10/03/12 1230 09/27/12 1025  NA 139 140 139  K 4.8 4.4 3.4*  CL 99 -- 98  CO2 26 -- 28  GLUCOSE 103* 92 101*  BUN 47* -- 17  CREATININE 12.13* -- 5.38*  CALCIUM 9.7 -- 11.1*  ALB -- -- --  PHOS 4.4 -- --   Liver Function Tests:  Lab 10/04/12 0719  AST --  ALT --  ALKPHOS --  BILITOT --  PROT --  ALBUMIN 2.9*   No results found for this basename: LIPASE:3,AMYLASE:3 in the last 168 hours No results found for this basename: AMMONIA:3 in the last 168 hours CBC:  Lab 10/04/12 0718 10/03/12 1230 09/27/12 1025  WBC 12.0* -- 11.1*  NEUTROABS -- -- 6.8  HGB 9.0* 9.9* 12.8  HCT 26.9* 29.0* 38.1  MCV 82.3 -- 80.7  PLT 296 -- 230   Cardiac Enzymes: No results found for this basename: CKTOTAL:5,CKMB:5,CKMBINDEX:5,TROPONINI:5 in the last 168 hours CBG:  Lab 10/03/12 1538  GLUCAP 97    Iron Studies: No results found for this basename: IRON,TIBC,TRANSFERRIN,FERRITIN in the last 72 hours Studies/Results: Dg Chest 2 View  10/03/2012  *RADIOLOGY REPORT*  Clinical Data: Preop patellar tendon repair  CHEST - 2 VIEW  Comparison: 08/23/2012  Findings: Mild cardiac enlargement.  Negative for heart failure or edema.  No significant pleural effusion.  No infiltrate or mass is present. Interval clearing of right lower lobe airspace density  IMPRESSION: Cardiac enlargement without acute abnormality.   Original Report  Authenticated By: Carl Best, M.D.    Medications:      . darbepoetin (ARANESP) injection - DIALYSIS  40 mcg Intravenous Q Wed-HD  . docusate sodium  100 mg Oral BID  . multivitamin  1 tablet Oral QHS  . paricalcitol      . paricalcitol  4 mcg Intravenous Q M,W,F-HD  . vancomycin  1,000 mg Intravenous Q M,W,F-HD   I  have reviewed scheduled and prn medications. Physical Exam:  General: Alert  On HD,, NAD, Appropriate,  Heart: RRR, no rub or Murmur  Lungs: CTA BILAT  Abdomen: Bs+=, soft , nontender  Extremities: Dialysis Access: Right leg / Patella in Brace, no pedal edema, Right Forearm AVGG patent on hd  HD ORDERS= [facility], MWF , EDW 91.0 kg, bfr 450 dfr 800, Bath = @.0 K, 3.5 CA Heparin 2 ml and .5 intermittently , hectoral 43mcg , epo 0, vancomycin 1 gm q hd/ Access= R FA AVGG   Problem/Plan:  1. SP Partial Patellectomy and Patellar Tendon Repair = per Dr. Onnie Graham 2. Ho MSSA and Strep B bacteriemia from outpatient blood cultures Sep 22, 2012 (inpatient blood and wound cx's were negative)  related to bleeding AVG which was operated on by [facility] Jan 8 with replacement of venous limb and repair of pseudoaneurysm. Now is getting Vancomycin 1 gm q hd,  level= 11.5 and continue on HD through jan 24( 3 week therapy) 3. ESRD - MWF hd [facility] 4. Anemia - hgb 9.9 to 9.0 this am/ will need to restart epo as out pt. ``4,000 units q HD and fu out pt hgb / given aranesp 40 mg today 5. Secondary hyperparathyroidism - hectoral 83mcg q hd/ binders tums ultra 4 ac/ ca corrected 10.8 and phos 4.4 this am 6. HTN/volume - uf on hd to edw/ 97/60 asymptomatic no meds 7. Dispo- per ortho  Ernest Haber, PA-C [place] [phone] 10/04/2012,8:50 AM  LOS: 1 day   Patient seen and examined and reviewed with Ernest Haber, P.A..  Agree with assessment and plan as above. Kelly Splinter  MD [facility] [phone] pgr    [phone] cell 10/04/2012, 12:38 PM

## 2012-10-04 NOTE — Progress Notes (Signed)
Physical Therapy Evaluation Patient Details Name: Victoria [name] MRN: [id number] DOB: January 07, 1956 Today's Date: 10/04/2012 Time: [id number] PT Time Calculation (min): 23 min  PT Assessment / Plan / Recommendation Clinical Impression  57 yo female admitted with Right patellar fx, s/p partial patellectomy with tendon repair; Overall moving well enough for dc home with prn assist from significant other; Recommend HHPT follow up; Also recommend 3in1, wheelchair for community/hemodialysis access; Noted plans for dc home today; all further PT needs can be met by HHPT    PT Assessment  All further PT needs can be met in the next venue of care    Follow Up Recommendations  Home health PT;Supervision/Assistance - 24 hour    Does the patient have the potential to tolerate intense rehabilitation      Barriers to Discharge        Equipment Recommendations  Wheelchair (measurements PT) (3in1; elevating legrests for wheelchair)    Recommendations for Other Services     Frequency      Precautions / Restrictions Precautions Precautions: Fall Required Braces or Orthoses: Knee Immobilizer - Right Knee Immobilizer - Right: On at all times Restrictions RLE Weight Bearing: Weight bearing as tolerated   Pertinent Vitals/Pain 2/10 pain R knee Repositioned, RN offered pain meds prior      Mobility  Bed Mobility Bed Mobility: Supine to Sit;Sitting - Scoot to Edge of Bed Supine to Sit: With rails;4: Min assist Sitting - Scoot to Edge of Bed: 5: Supervision Details for Bed Mobility Assistance: Cues for technique; min handheld assist to elevate trunk from bed Transfers Transfers: Sit to Stand;Stand to Sit Sit to Stand: 4: Min assist;From bed;With upper extremity assist Stand to Sit: 4: Min guard;With armrests;To chair/3-in-1 Details for Transfer Assistance: cues for safety and hand placement; pulled up on RW despite cues otherwise -- therefore requiring min assist to steady  RW Ambulation/Gait Ambulation/Gait Assistance: 4: Min guard (without physical contact) Ambulation Distance (Feet): 100 Feet Assistive device: Rolling walker Ambulation/Gait Assistance Details: cues for gait sequence; Good use of RW for steadiness Gait Pattern: Step-to pattern    Shoulder Instructions     Exercises     PT Diagnosis: Difficulty walking  PT Problem List: Decreased strength;Decreased activity tolerance;Decreased balance;Decreased mobility;Decreased knowledge of use of DME;Decreased knowledge of precautions;Pain PT Treatment Interventions:     PT Goals    Visit Information  Last PT Received On: 10/04/12 Assistance Needed: +1    Subjective Data  Subjective: Wanting to dc home today Patient Stated Goal: home today; concerned about access to HD 3x/wk   Prior Functioning  Home Living Lives With: Significant other Available Help at Discharge: Family;Available 24 hours/day Type of Home: House Home Access: Level entry Home Layout: One level Bathroom Toilet: Standard Bathroom Accessibility: Yes How Accessible: Accessible via walker Home Adaptive Equipment: Walker - rolling Additional Comments: pt interested in w/c for HD but does not want to purchase one if insurance does not cover cost- would rather rent Prior Function Level of Independence: Independent with assistive device(s) Driving: No Vocation: On disability Communication Communication: No difficulties    Cognition  Overall Cognitive Status: Appears within functional limits for tasks assessed/performed Arousal/Alertness: Awake/alert Orientation Level: Appears intact for tasks assessed Behavior During Session: [facility] for tasks performed    Extremity/Trunk Assessment Right Upper Extremity Assessment RUE ROM/Strength/Tone: [facility] for tasks assessed Left Upper Extremity Assessment LUE ROM/Strength/Tone: WFL for tasks assessed Right Lower Extremity Assessment RLE ROM/Strength/Tone: Deficits;Due to pain RLE  ROM/Strength/Tone Deficits: Hip, ankle ROM  WFL; Knee immobilized Left Lower Extremity Assessment LLE ROM/Strength/Tone: Within functional levels   Balance    End of Session PT - End of Session Equipment Utilized During Treatment: Gait belt;Right knee immobilizer Activity Tolerance: Patient tolerated treatment well Patient left: in chair;with call bell/phone within reach;with family/visitor present Nurse Communication: Mobility status (ok for dc, and equipment recs)  GP Functional Assessment Tool Used: Clinical Judgement Functional Limitation: Mobility: Walking and moving around Mobility: Walking and Moving Around Current Status [phone]): At least 1 percent but less than 20 percent impaired, limited or restricted Mobility: Walking and Moving Around Goal Status [phone]): 0 percent impaired, limited or restricted Mobility: Walking and Moving Around Discharge Status [phone]): At least 1 percent but less than 20 percent impaired, limited or restricted   Roney Marion [facility] [place], [place]  10/04/2012, 4:10 PM

## 2012-10-04 NOTE — Progress Notes (Signed)
Pt received copy of discharge instructions, son at bedside, pt verbalizes understanding of instructions and signs/symptoms to follow up with MD, IV D'C'd, pt awaiting medical supplies from [place]

## 2012-10-04 NOTE — Progress Notes (Signed)
OT Note:  Eval held this am as pt in HD. Will check back later as schedule allows.   Thank you. Darci Current, OTR/L Pager: [phone] 10/04/2012

## 2012-10-04 NOTE — Discharge Summary (Signed)
  PATIENT ID:      Victoria [name]  MRN:     [id number] DOB/AGE:    Oct 30, 1955 / 57 y.o.     DISCHARGE SUMMARY  ADMISSION DATE:    10/03/2012 DISCHARGE DATE:   10/04/2012   ADMISSION DIAGNOSIS: RIGHT PATELLA TENDON AVULSION  (RIGHT PATELLA TENDON AVULSION)  DISCHARGE DIAGNOSIS:  RIGHT PATELLA TENDON AVULSION    ADDITIONAL DIAGNOSIS:  Past Medical History  Diagnosis Date  . Hypertension     Now controlled by HD  . Complication of anesthesia ~ 2011    "they gave me a medicine that swolled me up" (08/23/2012)  . Seasonal allergies   . ESRD on dialysis     HD since approx 1992; "Jeneen Rinks; M, W, F"  . H/O cardiovascular stress test     Myoview 08/24/12: Inferolateral and anteroseptal areas of scar, no findings of ischemia, EF 77%.  . Dialysis patient     PROCEDURE: Procedure(s): PATELLECTOMY PATELLA TENDON REPAIR on 10/03/2012  CONSULTS: Treatment Team:  Sol Blazing, MD   HISTORY:  See H&P in chart  HOSPITAL COURSE:  Victoria [name] is a 57 y.o. admitted on 10/03/2012 and found to have a diagnosis of [place].  After appropriate laboratory studies were obtained  they were taken to the operating room on 10/03/2012 and underwent Procedure(s): [place].   They were given perioperative antibiotics:  Anti-infectives     Start     Dose/Rate Route Frequency Ordered Stop   10/04/12 1200   vancomycin (VANCOCIN) IVPB 1000 mg/200 mL premix        1,000 mg 200 mL/hr over 60 Minutes Intravenous Every M-W-F (Hemodialysis) 10/03/12 1648     10/03/12 1229   cefUROXime (ZINACEF) 1.5 g in dextrose 5 % 50 mL IVPB  Status:  Discontinued        1.5 g 100 mL/hr over 30 Minutes Intravenous 30 min pre-op 10/03/12 1229 10/03/12 1239   10/03/12 0600   ceFAZolin (ANCEF) 3 g in dextrose 5 % 50 mL IVPB        3 g 160 mL/hr over 30 Minutes Intravenous On call to O.R. 10/02/12 1419 10/03/12 1413        . Blood products given:none  Pt did well post  operatively and received hemodyalisis the following day. Her pain was controlled and she was NVI The remainder of the hospital course was dedicated to ambulation and strengthening.   The patient was discharged on 1 Day Post-Op in  Good condition.

## 2012-10-04 NOTE — Progress Notes (Signed)
Dialysis initiated via RFA AVG. Arterial end 15gax1" (down) no difficulty. Venous end, difficult stick (5 attempts) deep. 15ga x 1 1/4" up.  Pt. Tolerated well.  Outpatient plan of care on chart/reviewed.

## 2012-10-04 NOTE — Anesthesia Postprocedure Evaluation (Signed)
  Anesthesia Post-op Note  Patient: Victoria [name]  Procedure(s) Performed: Procedure(s) (LRB) with comments: PATELLECTOMY (Right) - RIGHT PARTIAL PATELLECTOMY AND PATELLA TENDON REPAIR PATELLA TENDON REPAIR (Right)  Patient Location: PACU  Anesthesia Type:GA combined with regional for post-op pain  Level of Consciousness: awake and alert   Airway and Oxygen Therapy: Patient Spontanous Breathing  Post-op Pain: mild  Post-op Assessment: Post-op Vital signs reviewed, Patient's Cardiovascular Status Stable, Respiratory Function Stable, Patent Airway and No signs of Nausea or vomiting  Post-op Vital Signs: Reviewed and stable  Complications: No apparent anesthesia complications

## 2012-10-04 NOTE — Procedures (Signed)
I was present at this dialysis session. I have reviewed the session itself and made appropriate changes.   Kelly Splinter, MD [employer] 10/04/2012, 12:35 PM

## 2012-10-04 NOTE — Evaluation (Signed)
Occupational Therapy Evaluation Patient Details Name: Victoria [name] MRN: [id number] DOB: 11-07-1955 Today's Date: 10/04/2012 Time: [id number] OT Time Calculation (min): 17 min  OT Assessment / Plan / Recommendation Clinical Impression  The patient is a 57 y.o. female with a right inferior pole patella fracture with patellar avulsion and is on dialysis. Pt is s/p partial patellectomy and patellar tendon repair. Pt is WBAT on RLE with KI during ambulation- all education completed and equipment ordered. No further acute OT needs at this time as pt to d/c this afternoon.    OT Assessment  Patient does not need any further OT services    Follow Up Recommendations  Supervision/Assistance - 24 hour;No OT follow up    Barriers to Discharge      Equipment Recommendations  3 in 1 bedside comode    Recommendations for Other Services    Frequency       Precautions / Restrictions Precautions Precautions: Fall Restrictions RLE Weight Bearing: Weight bearing as tolerated   Pertinent Vitals/Pain Pt reports pain in RLE but did not rate. Does state she is premedicated    ADL  Grooming: Wash/dry face;Wash/dry hands;Min guard Where Assessed - Grooming: Unsupported sitting Lower Body Bathing: Moderate assistance Where Assessed - Lower Body Bathing: Supported sit to stand Lower Body Dressing: Moderate assistance Where Assessed - Lower Body Dressing: Supported sit to stand Toilet Transfer: Magazine features editor Method: Arts development officer: Bedside commode    OT Diagnosis:    OT Problem List:   OT Treatment Interventions:     OT Goals    Visit Information       Subjective Data  Subjective: "I want to be able to walk on my own- better and better." Patient Stated Goal: Return home   Prior [place] Lives With: Significant other Available Help at Discharge: Family;Available 24 hours/day Type of Home: House Home Access: Level entry Home  Layout: One level Bathroom Toilet: Standard Bathroom Accessibility: Yes How Accessible: Accessible via walker Home Adaptive Equipment: Walker - rolling Additional Comments: pt interested in w/c for HD but does not want to purchase one if insurance does not cover cost- would rather rent Prior Function Level of Independence: Independent with assistive device(s) Driving: No Vocation: On disability         Vision/Perception     Cognition  Overall Cognitive Status: Appears within functional limits for tasks assessed/performed Arousal/Alertness: Awake/alert Orientation Level: Appears intact for tasks assessed Behavior During Session: [facility] for tasks performed    Extremity/Trunk Assessment Right Upper Extremity Assessment RUE ROM/Strength/Tone: [facility] for tasks assessed Left Upper Extremity Assessment LUE ROM/Strength/Tone: [facility] for tasks assessed     Mobility       Shoulder Instructions     Exercise     Balance     End of Session OT - End of Session Equipment Utilized During Treatment: Gait belt Activity Tolerance: Patient limited by pain Patient left: in chair;with call bell/phone within reach;with family/visitor present Nurse Communication: Mobility status  GO Functional Assessment Tool Used: clinical judgment Functional Limitation: Self care Self Care Current Status [id number]): At least 40 percent but less than 60 percent impaired, limited or restricted Self Care Goal Status [id number]): At least 40 percent but less than 60 percent impaired, limited or restricted Self Care Discharge Status [phone]): At least 40 percent but less than 60 percent impaired, limited or restricted   Bohden Dung 10/04/2012, 3:52 PM

## 2012-10-04 NOTE — Progress Notes (Signed)
PT Cancellation Note  Patient Details Name: SHANEQWA BOBERG MRN: [id number] DOB: [birth year]   Cancelled Treatment:    Reason Eval/Treat Not Completed: Patient at procedure or test/unavailable  Currently in HD; Will retry later today as time allows, otherwise will follow-up tomorrow for PT eval;  Thanks,   Roney Marion [facility] 10/04/2012, 8:46 AM

## 2012-10-05 ENCOUNTER — Encounter (HOSPITAL_COMMUNITY): Payer: Self-pay | Admitting: Orthopedic Surgery

## 2012-10-05 NOTE — Care Management Note (Signed)
Late entry, [facility] and DME request completed 10/04/2012 and pt d/c.     CARE MANAGEMENT NOTE 10/05/2012  Patient:  Victoria [name], Victoria [name]   Account Number:  [id number]  Date Initiated:  10/05/2012  Documentation initiated by:  Jasmine Pang  Subjective/Objective Assessment:   Order wheelchair, 3:1 and PT evalrecommended HHPT     Action/Plan:   Pt selected [facility] for DME and [facility] for HHPT.   Anticipated DC Date:  10/04/2012   Anticipated DC Plan:  [place]         [facility] Choice  HOME HEALTH  DURABLE MEDICAL EQUIPMENT   Choice offered to / List presented to:  C-1 Patient   DME arranged  [place]  3-N-1      DME agency  [place] arranged  [place]   Status of service:  Completed, signed off Medicare Important Message given?   (If response is "NO", the following Medicare IM given date fields will be blank) Date Medicare IM given:   Date Additional Medicare IM given:    Discharge Disposition:  [place]  Per UR Regulation:    If discussed at Long Length of Stay Meetings, dates discussed:    Comments:

## 2012-10-06 ENCOUNTER — Emergency Department (HOSPITAL_COMMUNITY): Payer: Medicare Other

## 2012-10-06 ENCOUNTER — Emergency Department (HOSPITAL_COMMUNITY)
Admission: EM | Admit: 2012-10-06 | Discharge: 2012-10-06 | Disposition: A | Discharge status: Home / Self Care | Payer: Medicare Other | Attending: Emergency Medicine | Admitting: Emergency Medicine

## 2012-10-06 ENCOUNTER — Encounter (HOSPITAL_COMMUNITY): Payer: Self-pay | Admitting: Emergency Medicine

## 2012-10-06 DIAGNOSIS — R109 Unspecified abdominal pain: Secondary | ICD-10-CM | POA: Insufficient documentation

## 2012-10-06 DIAGNOSIS — K5900 Constipation, unspecified: Secondary | ICD-10-CM | POA: Insufficient documentation

## 2012-10-06 DIAGNOSIS — Z992 Dependence on renal dialysis: Secondary | ICD-10-CM | POA: Insufficient documentation

## 2012-10-06 DIAGNOSIS — N186 End stage renal disease: Secondary | ICD-10-CM

## 2012-10-06 DIAGNOSIS — Z8679 Personal history of other diseases of the circulatory system: Secondary | ICD-10-CM | POA: Insufficient documentation

## 2012-10-06 DIAGNOSIS — I120 Hypertensive chronic kidney disease with stage 5 chronic kidney disease or end stage renal disease: Secondary | ICD-10-CM | POA: Insufficient documentation

## 2012-10-06 DIAGNOSIS — Z79899 Other long term (current) drug therapy: Secondary | ICD-10-CM | POA: Insufficient documentation

## 2012-10-06 MED ORDER — DOCUSATE SODIUM 100 MG PO CAPS: 100.0000 mg | ORAL_CAPSULE | Freq: Two times a day (BID) | ORAL | Status: DC

## 2012-10-06 NOTE — ED Notes (Addendum)
Pt c/o abd pain and bloating and no BM x 3 days since procedure and now straining with no result; pt sts last dialysis was today

## 2012-10-06 NOTE — ED Provider Notes (Signed)
History     CSN: [id number]  Arrival date & time 10/06/12  40   First MD Initiated Contact with Patient 10/06/12 1110      Chief Complaint  Patient presents with  . Constipation  . Abdominal Pain    (Consider location/radiation/quality/duration/timing/severity/associated sxs/prior treatment) Patient is a 57 y.o. female presenting with constipation and abdominal pain. The history is provided by the patient.  Constipation  Associated symptoms include abdominal pain. Pertinent negatives include no fever, no chest pain, no headaches and no rash.  Abdominal Pain The primary symptoms of the illness include abdominal pain. The primary symptoms of the illness do not include fever or shortness of breath.  Additional symptoms associated with the illness include constipation. Symptoms associated with the illness do not include back pain.   The patient is dialysis patient she was dialysis today and having the complaint of constipation unable to have a bowel movement for the past 3 days. She had knee surgery by orthopedics few days ago has not had a bowel movement since. Not taking pain medicines currently she does not like to take them. Patient said trouble with constipation in the past normally fleets enemas has helped her. She did not try anything at home this time for the bowel movement. She was also softeners in the hospital but was not sent home with stool softeners. No abdominal pain other than rectal pain no rectal bleeding no nausea or vomiting. Patient states that the rectal pain is worse when she strains to have a bowel movement. She rates the pain as a 5/10.  Past Medical History  Diagnosis Date  . Hypertension     Now controlled by HD  . Complication of anesthesia ~ 2011    "they gave me a medicine that swolled me up" (08/23/2012)  . Seasonal allergies   . ESRD on dialysis     HD since approx 1992; "[facility] Rinks; M, W, F"  . H/O cardiovascular stress test     Myoview 08/24/12:  Inferolateral and anteroseptal areas of scar, no findings of ischemia, EF 77%.  . Dialysis patient     Past Surgical History  Procedure Date  . Parathyroidectomy 2012    With implant to left forearm  . Thyroidectomy, partial 2012    Isthmusectomy   . Total abdominal hysterectomy 2007  . Dilation and curettage of uterus 1970's    "when I was pregnant in my tubes" (08/23/2012)  . Thrombectomy and revision of arterioventous (av) goretex  graft 2012, 2013  . Dialysis fistula creation 1992    "left forearm" (08/23/2012)  . Arteriovenous graft placement 1990's; ~ 2011    "left upper arm; right forearm" (08/23/2012)  . Ankle fracture surgery ~ 2009    "right" (08/23/2012)  . Revision of arteriovenous goretex graft 09/27/2012    Procedure: REVISION OF ARTERIOVENOUS GORETEX GRAFT;  Surgeon: Mal Misty, MD;  Location: [facility] OR;  Service: Vascular;  Laterality: Right;  1) Replacement of venous half of loop with 63mm Gortex graft  2) Excision of erroded pseudoaneurysm of graft with primary closure.  . Patellectomy 10/03/2012    Procedure: PATELLECTOMY;  Surgeon: Marin Shutter, MD;  Location: [place];  Service: Orthopedics;  Laterality: Right;  RIGHT PARTIAL PATELLECTOMY AND PATELLA TENDON REPAIR  . Patellar tendon repair 10/03/2012    Procedure: PATELLA TENDON REPAIR;  Surgeon: Marin Shutter, MD;  Location: [place];  Service: Orthopedics;  Laterality: Right;    Family History  Problem Relation Age of Onset  .  Hypertension Father   . Thyroid disease Father   . Hypertension Sister   . Thyroid disease Sister     History  Substance Use Topics  . Smoking status: Never Smoker   . Smokeless tobacco: Never Used  . Alcohol Use: No     Comment: quit 2007    OB History    Grav Para Term Preterm Abortions TAB SAB Ect Mult Living                  Review of Systems  Constitutional: Negative for fever.  HENT: Negative for congestion.   Respiratory: Negative for shortness of breath.   Cardiovascular:  Negative for chest pain.  Gastrointestinal: Positive for abdominal pain and constipation.  Genitourinary: Negative for pelvic pain.  Musculoskeletal: Negative for back pain.  Skin: Negative for rash.  Neurological: Negative for headaches.  Hematological: Does not bruise/bleed easily.    Allergies  Lisinopril and Norvasc  Home Medications   Current Outpatient Rx  Name  Route  Sig  Dispense  Refill  . CALCIUM CARBONATE ANTACID 500 MG PO CHEW   Oral   Chew 4 tablets by mouth 3 (three) times daily with meals.          . STOOL SOFTENER PO   Oral   Take 1 capsule by mouth daily as needed. For constipation         . DOCUSATE SODIUM 100 MG PO CAPS   Oral   Take 1 capsule (100 mg total) by mouth every 12 (twelve) hours.   14 capsule   0     BP 121/80  Pulse 105  Temp 98.1 F (36.7 C) (Oral)  Resp 20  SpO2 99%  Physical Exam  Nursing note and vitals reviewed. Constitutional: She is oriented to person, place, and time. She appears well-developed and well-nourished. No distress.  HENT:  Head: Normocephalic and atraumatic.  Eyes: Conjunctivae normal and EOM are normal. Pupils are equal, round, and reactive to light.  Neck: Normal range of motion. Neck supple.  Cardiovascular: Normal rate and regular rhythm.   No murmur heard. Pulmonary/Chest: Effort normal and breath sounds normal.  Abdominal: Soft. Bowel sounds are normal. There is no tenderness.  Genitourinary:       Rectal exam with a moderate amount of hard stool in the rectum some broken up by digital examination breast was disimpacted. No external hemorrhoids no anal fissure no gross rectal blood. No masses.  Musculoskeletal: Normal range of motion.  Neurological: She is alert and oriented to person, place, and time. No cranial nerve deficit. She exhibits normal muscle tone. Coordination normal.  Skin: Skin is warm. No rash noted.    ED Course  Procedures (including critical care time)  Labs Reviewed - No data  to display Dg Abd Acute W/chest  10/06/2012  *RADIOLOGY REPORT*  Clinical Data: Constipation  ACUTE ABDOMEN SERIES (ABDOMEN 2 VIEW & CHEST 1 VIEW)  Comparison: 10/03/2012  Findings: Cardiomediastinal silhouette is stable.  No acute infiltrate or pleural effusion.  No pulmonary edema.  There is nonspecific nonobstructive bowel gas pattern.  Significant stool noted in the right colon and proximal transverse colon.  Moderate stool noted within rectum.  No free abdominal air.  IMPRESSION: No acute disease within chest.  Nonspecific nonobstructive bowel gas pattern.  Significant stool noted in proximal colon.  Moderate stool noted within rectum.   Original Report Authenticated By: Lahoma Crocker, M.D.      1. Constipation   2. End stage renal  disease       MDM   Dialysis patient came to dialysis today. Next dialysis on Monday patient with recent right knee surgery and since that time she has been constipated. She is restrained ago but has not been able to go no nausea vomiting or diarrhea. No abdominal pain just a rectal pain.  Rectal exam in ED with a small amount of hard stool in the rectal area some was disimpacted the rest of it was broke up. X-rays of her abdomen showed a lot more stool large amount stool in the proximal colon as well. No signs of obstruction. Will treat with Colace and have patient use fleets enemas at home she is tried in the past and they've been very helpful with this happen before.        Mervin Kung, MD 10/06/12 1329

## 2012-10-09 ENCOUNTER — Ambulatory Visit: Payer: Medicare Other | Admitting: Surgery

## 2012-10-16 ENCOUNTER — Encounter: Payer: Self-pay | Admitting: Vascular Surgery

## 2012-10-17 ENCOUNTER — Encounter: Payer: Self-pay | Admitting: Vascular Surgery

## 2012-10-17 ENCOUNTER — Ambulatory Visit (INDEPENDENT_AMBULATORY_CARE_PROVIDER_SITE_OTHER): Payer: Medicare Other | Admitting: Vascular Surgery

## 2012-10-17 VITALS — BP 150/87 | HR 101 | Resp 18 | Ht 63.5 in | Wt 190.0 lb

## 2012-10-17 DIAGNOSIS — N186 End stage renal disease: Secondary | ICD-10-CM

## 2012-10-17 NOTE — Progress Notes (Signed)
Subjective:     Patient ID: Victoria [name], female   DOB: 04-Apr-1956, 57 y.o.   MRN: [id number]  HPI this 57 year old female returns for followup regarding revision of her right forearm AV Gore-Tex graft because of a pseudoaneurysm with erosion through the skin and bleeding. The venous half of the loop was replaced and revised higher on the basilic vein. The graft has functioned well and dialysis center has been using the arterial half only for the past few weeks.   Review of Systems     Objective:   Physical ExamBP 150/87  Pulse 101  Resp 18  Ht 5' 3.5" (1.613 m)  Wt 190 lb (86.183 kg)  BMI 33.13 kg/m2  General well-developed well-nourished female in no apparent stress alert and oriented x3 Right upper extremity with incision where he noted the graft was removed which was closed primarily is healing nicely with nylon sutures in place The new venous half of the loop is within the loop and is palpable with a good pulse. There is a pseudoaneurysm on the arterial half of the loop at the 7:00 position which eventually will need replacement    Assessment:     Doing well post replacement venous cath loop right AV graft do to bleeding and erosion through skin of graft    Plan:     May utilize venous half of loop week of February 10 Sutures removed right upper extremity today Return on when necessary basis

## 2012-10-24 ENCOUNTER — Ambulatory Visit (INDEPENDENT_AMBULATORY_CARE_PROVIDER_SITE_OTHER): Payer: Medicare Other | Admitting: Physician Assistant

## 2012-10-24 ENCOUNTER — Encounter: Payer: Self-pay | Admitting: Physician Assistant

## 2012-10-24 VITALS — BP 126/78 | HR 78 | Ht 63.5 in | Wt 198.4 lb

## 2012-10-24 DIAGNOSIS — E785 Hyperlipidemia, unspecified: Secondary | ICD-10-CM

## 2012-10-24 DIAGNOSIS — R9439 Abnormal result of other cardiovascular function study: Secondary | ICD-10-CM

## 2012-10-24 DIAGNOSIS — R7989 Other specified abnormal findings of blood chemistry: Secondary | ICD-10-CM

## 2012-10-24 LAB — HEPATIC FUNCTION PANEL
AST: 20 U/L (ref 0–37)
Albumin: 3.5 g/dL (ref 3.5–5.2)
Alkaline Phosphatase: 42 U/L (ref 39–117)
Total Protein: 7 g/dL (ref 6.0–8.3)

## 2012-10-24 MED ORDER — NITROGLYCERIN 0.4 MG SL SUBL: 0.4000 mg | SUBLINGUAL_TABLET | SUBLINGUAL | Status: DC | PRN

## 2012-10-24 MED ORDER — ASPIRIN EC 81 MG PO TBEC: 81.0000 mg | DELAYED_RELEASE_TABLET | Freq: Every day | ORAL | Status: DC

## 2012-10-24 NOTE — Progress Notes (Signed)
Please do not send notes in EPIC as we may not see it or react to it for delayed periods.

## 2012-10-24 NOTE — Progress Notes (Signed)
[address] [place], [place]  [address] Phone: [phone], Fax:  [phone]  Date:  10/24/2012   ID:  Victoria [name], DOB [birth year], MRN [id number]  PCP:  Placido Sou, MD  Primary Cardiologist:  Dr. Lauree Chandler     History of Present Illness: Victoria [name] is a 57 y.o. female who returns for follow up after recent admission to the hospital for chest pain.    She has a hx of HTN, ESRD on HD.  Echo 6/07: low normal EF, basal inf AK, mild LVH, mild aortic root dilatation, mild MAC.  She was admitted 12/4-12/5.  She developed chest pain during dialysis. She ruled out for MI. Inpatient Myoview was performed and demonstrated inferolateral and anteroseptal areas of scar, no ischemia, EF 77%. Of note, wall motion is normal. She had minimally elevated LFTs and statin therapy was deferred. Since discharge, she fell and injured her right knee. She has undergone surgery for patellar fracture patellar tendon repair. She denies any further chest pain. She does note dyspnea with exertion. She probably describes NYHA class II-IIb symptoms. She denies orthopnea, PND or edema. She denies syncope  Labs (12/13):   K 4, creatinine 6.12, AST 42, ALT 41, LDL 121, TSH 1.177 Labs (1/14):     K 4.8, creatinine 12.13, Hgb 9  Wt Readings from Last 3 Encounters:  10/24/12 198 lb 6.4 oz (89.994 kg)  10/17/12 190 lb (86.183 kg)  10/04/12 212 lb 1.3 oz (96.2 kg)     Past Medical History  Diagnosis Date  . Hypertension     Now controlled by HD  . Complication of anesthesia ~ 2011    "they gave me a medicine that swolled me up" (08/23/2012)  . Seasonal allergies   . ESRD on dialysis     HD since approx 1992; "Jeneen Rinks; M, W, F"  . H/O cardiovascular stress test     Myoview 08/24/12: Inferolateral and anteroseptal areas of scar, no findings of ischemia, EF 77%.  . Dialysis patient     Current Outpatient Prescriptions  Medication Sig Dispense Refill  . calcium  carbonate (TUMS - DOSED IN MG ELEMENTAL CALCIUM) 500 MG chewable tablet Chew 4 tablets by mouth 3 (three) times daily with meals.         Allergies:    Allergies  Allergen Reactions  . Lisinopril Swelling and Other (See Comments)    "made my chest hurt"  . Norvasc (Amlodipine Besylate) Swelling    Social History:  The patient  reports that she has never smoked. She has never used smokeless tobacco. She reports that she does not drink alcohol or use illicit drugs.   ROS:  Please see the history of present illness.   No melena, hematochezia. She has a dry, nonproductive cough.   All other systems reviewed and negative.   PHYSICAL EXAM: VS:  BP 126/78  Pulse 78  Ht 5' 3.5" (1.613 m)  Wt 198 lb 6.4 oz (89.994 kg)  BMI 34.59 kg/m2 Well nourished, well developed, in no acute distress HEENT: normal Neck: no JVD Vascular: No carotid bruits Cardiac:  normal S1, S2; RRR; no murmur Lungs:  clear to auscultation bilaterally, no wheezing, rhonchi or rales Abd: soft, nontender, no hepatomegaly Ext: no edema Skin: warm and dry Neuro:  CNs 2-12 intact, no focal abnormalities noted  EKG:  NSR, HR 78, LAD, poor R wave progression, no change from prior tracings     ASSESSMENT AND PLAN:  1.  Abnormal Myoview:  She had evidence of inferolateral and anteroseptal scar but no ischemia with normal EF and normal wall motion. She denies any further chest pain. I will arrange a 2-D echocardiogram to further assess LV wall motion. If this confirms inferolateral and anteroseptal wall motion abnormalities, we will presume that she has CAD. Medical therapy will be pursued unless she has recurrent symptoms. She will start aspirin 81 mg daily. I will give her a prescription for nitroglycerin when necessary. She had abnormal LFTs in the hospital. She denies any history of liver problems. I will recheck LFTs today. If these are normal, I will start her on statin therapy. 2. Hyperlipidemia: Obtain LFTs as noted.  Proceed with statin therapy if LFTs are normal. 3. ESRD:  She remains on hemodialysis. 4. Disposition:  Followup with me or Dr. Lauree Chandler in 3 mos.   Danton Sewer, PA-C  8:55 AM 10/24/2012

## 2012-10-24 NOTE — Patient Instructions (Addendum)
Your physician has requested that you have an echocardiogram DX ABNORMAL STRESS TEST. Echocardiography is a painless test that uses sound waves to create images of your heart. It provides your doctor with information about the size and shape of your heart and how well your heart's chambers and valves are working. This procedure takes approximately one hour. There are no restrictions for this procedure.  Your physician recommends that you return for lab work in: TODAY; LFT  START ASPIRIN 81 MG DAILY  A PRESCRIPTION FOR NITROGLYCERIN HAS BEEN SENT IN TODAY AND YOU HAVE BEEN ADVISED AS TO HOW AND WHEN TO USE NTG.  Your physician recommends that you schedule a follow-up appointment in: [place], PAC WITH DR. Angelena Form IN THE OFFICE

## 2012-10-25 ENCOUNTER — Telehealth: Payer: Self-pay | Admitting: *Deleted

## 2012-10-25 DIAGNOSIS — E785 Hyperlipidemia, unspecified: Secondary | ICD-10-CM | POA: Insufficient documentation

## 2012-10-25 MED ORDER — ATORVASTATIN CALCIUM 40 MG PO TABS: 40.0000 mg | ORAL_TABLET | Freq: Every day | ORAL | Status: DC

## 2012-10-25 NOTE — Telephone Encounter (Signed)
pt notified about lab results and to start atorvastatin 40 qd, lft 11/16/12, FLP/LFT 12/12/12, pt vebralized understanding today to all instructions , rx sent in

## 2012-10-25 NOTE — Telephone Encounter (Signed)
Message copied by Michae Kava on Wed Oct 25, 2012  5:17 PM ------      Message from: [place], [place] T      Created: Wed Oct 25, 2012  2:00 PM       Atorvastatin 40 mg QD.      Check LFTs in 3 weeks.      Check Lipids and LFTs in 6-8 weeks.      Richardson Dopp, PA-C  2:00 PM 10/25/2012

## 2012-10-30 ENCOUNTER — Other Ambulatory Visit (HOSPITAL_COMMUNITY): Payer: Self-pay | Admitting: Cardiovascular Disease

## 2012-10-30 DIAGNOSIS — R9439 Abnormal result of other cardiovascular function study: Secondary | ICD-10-CM

## 2012-10-31 ENCOUNTER — Ambulatory Visit (HOSPITAL_COMMUNITY): Payer: Medicare Other | Attending: Cardiology

## 2012-10-31 DIAGNOSIS — N186 End stage renal disease: Secondary | ICD-10-CM | POA: Insufficient documentation

## 2012-10-31 DIAGNOSIS — R9439 Abnormal result of other cardiovascular function study: Secondary | ICD-10-CM

## 2012-10-31 DIAGNOSIS — I369 Nonrheumatic tricuspid valve disorder, unspecified: Secondary | ICD-10-CM | POA: Insufficient documentation

## 2012-10-31 DIAGNOSIS — I059 Rheumatic mitral valve disease, unspecified: Secondary | ICD-10-CM | POA: Insufficient documentation

## 2012-10-31 DIAGNOSIS — I120 Hypertensive chronic kidney disease with stage 5 chronic kidney disease or end stage renal disease: Secondary | ICD-10-CM | POA: Insufficient documentation

## 2012-10-31 HISTORY — PX: TRANSTHORACIC ECHOCARDIOGRAM: SHX275

## 2012-10-31 NOTE — Progress Notes (Signed)
Echocardiogram performed.  

## 2012-11-12 ENCOUNTER — Emergency Department (HOSPITAL_COMMUNITY)
Admission: EM | Admit: 2012-11-12 | Discharge: 2012-11-12 | Disposition: A | Discharge status: Home / Self Care | Payer: Medicare Other | Attending: Emergency Medicine | Admitting: Emergency Medicine

## 2012-11-12 ENCOUNTER — Encounter (HOSPITAL_COMMUNITY): Payer: Self-pay | Admitting: Emergency Medicine

## 2012-11-12 ENCOUNTER — Emergency Department (HOSPITAL_COMMUNITY): Payer: Medicare Other

## 2012-11-12 DIAGNOSIS — I129 Hypertensive chronic kidney disease with stage 1 through stage 4 chronic kidney disease, or unspecified chronic kidney disease: Secondary | ICD-10-CM | POA: Insufficient documentation

## 2012-11-12 DIAGNOSIS — Z992 Dependence on renal dialysis: Secondary | ICD-10-CM | POA: Insufficient documentation

## 2012-11-12 DIAGNOSIS — Z7982 Long term (current) use of aspirin: Secondary | ICD-10-CM | POA: Insufficient documentation

## 2012-11-12 DIAGNOSIS — IMO0002 Reserved for concepts with insufficient information to code with codable children: Secondary | ICD-10-CM | POA: Insufficient documentation

## 2012-11-12 DIAGNOSIS — Z9109 Other allergy status, other than to drugs and biological substances: Secondary | ICD-10-CM | POA: Insufficient documentation

## 2012-11-12 DIAGNOSIS — X500XXA Overexertion from strenuous movement or load, initial encounter: Secondary | ICD-10-CM | POA: Insufficient documentation

## 2012-11-12 DIAGNOSIS — Z9889 Other specified postprocedural states: Secondary | ICD-10-CM | POA: Insufficient documentation

## 2012-11-12 DIAGNOSIS — N186 End stage renal disease: Secondary | ICD-10-CM | POA: Insufficient documentation

## 2012-11-12 DIAGNOSIS — Y929 Unspecified place or not applicable: Secondary | ICD-10-CM | POA: Insufficient documentation

## 2012-11-12 DIAGNOSIS — Y9389 Activity, other specified: Secondary | ICD-10-CM | POA: Insufficient documentation

## 2012-11-12 DIAGNOSIS — Z79899 Other long term (current) drug therapy: Secondary | ICD-10-CM | POA: Insufficient documentation

## 2012-11-12 MED ORDER — OXYCODONE-ACETAMINOPHEN 5-325 MG PO TABS: 1.0000 | ORAL_TABLET | Freq: Three times a day (TID) | ORAL | Status: DC | PRN

## 2012-11-12 NOTE — ED Notes (Signed)
Patient complaining of right knee pain.  Patient reports while bending over to put on a sock, she felt her right knee "pop".  Patient had surgery on right knee approximately one month ago for her "knee cap busting off that had to be sewn back on".  Upon arrival to room, patient changed into gown.  Patient is ambulatory with knee brace and stand-by assist.  Patient alert and oriented x4; PERRL present.  Will continue to monitor.

## 2012-11-12 NOTE — ED Notes (Signed)
Patient given copy of discharge paperwork; went over discharge instructions with patient.  Patient instructed to take Percocet as directed, to not drive or drink while taking narcotics, to keep follow up appointment with PCP, to not take pain medication any more than every eight hours due to her kidney history, and to return to the ED for new, worsening, or concerning symptoms.

## 2012-11-12 NOTE — ED Notes (Signed)
Patient reports nothing makes pain better; ambulation makes pain worse.

## 2012-11-12 NOTE — ED Notes (Signed)
Patient returned from X-ray 

## 2012-11-12 NOTE — ED Provider Notes (Signed)
History     CSN: [id number]  Arrival date & time 11/12/12  [id number]   First MD Initiated Contact with Patient 11/12/12 0405      Chief Complaint  Patient presents with  . Knee Pain    (Consider location/radiation/quality/duration/timing/severity/associated sxs/prior treatment) HPI Victoria [name] is a 57 y.o. female who presents with right knee pain. Patient recently had an ORIF of her right patella secondary to dislocation performed by Dr. [name] Housekeeper. She has a followup with him this week to get her right knee brace removed. She says on Friday (about 2 days ago) she was bending over putting a sock on, she said she heard a pop in the right knee and had some pain at the medial aspect of the back of the knee. Patient has been ambulatory with a knee brace. She says her pain is 7/10, it is dull, and is located right over the semi-tendinosis/semimembranosus insertion point of the medial aspect of the knee. Denies any history of venous thromboembolic disease, has not been immobilized for any prolonged period of time, she's had no calf swelling or ankle swelling in the right leg, she's got no varicose veins, no leg swelling, no treatment for active cancer, no pitting edema.  Past Medical History  Diagnosis Date  . Hypertension     Now controlled by HD  . Complication of anesthesia ~ 2011    "they gave me a medicine that swolled me up" (08/23/2012)  . Seasonal allergies   . ESRD on dialysis     HD since approx 1992; "[facility] Rinks; M, W, F"  . H/O cardiovascular stress test     Myoview 08/24/12: Inferolateral and anteroseptal areas of scar, no findings of ischemia, EF 77%.  . Dialysis patient     Past Surgical History  Procedure Laterality Date  . Parathyroidectomy  2012    With implant to left forearm  . Thyroidectomy, partial  2012    Isthmusectomy   . Total abdominal hysterectomy  2007  . Dilation and curettage of uterus  1970's    "when I was pregnant in my tubes" (08/23/2012)  . Thrombectomy  and revision of arterioventous (av) goretex  graft  2012, 2013  . Dialysis fistula creation  1992    "left forearm" (08/23/2012)  . Arteriovenous graft placement  1990's; ~ 2011    "left upper arm; right forearm" (08/23/2012)  . Ankle fracture surgery  ~ 2009    "right" (08/23/2012)  . Revision of arteriovenous goretex graft  09/27/2012    Procedure: REVISION OF ARTERIOVENOUS GORETEX GRAFT;  Surgeon: Mal Misty, MD;  Location: [facility] OR;  Service: Vascular;  Laterality: Right;  1) Replacement of venous half of loop with 49mm Gortex graft  2) Excision of erroded pseudoaneurysm of graft with primary closure.  . Patellectomy  10/03/2012    Procedure: PATELLECTOMY;  Surgeon: Marin Shutter, MD;  Location: [place];  Service: Orthopedics;  Laterality: Right;  RIGHT PARTIAL PATELLECTOMY AND PATELLA TENDON REPAIR  . Patellar tendon repair  10/03/2012    Procedure: PATELLA TENDON REPAIR;  Surgeon: Marin Shutter, MD;  Location: [place];  Service: Orthopedics;  Laterality: Right;    Family History  Problem Relation Age of Onset  . Hypertension Father   . Thyroid disease Father   . Hypertension Sister   . Thyroid disease Sister     History  Substance Use Topics  . Smoking status: Never Smoker   . Smokeless tobacco: Never Used  . Alcohol Use:  No     Comment: quit 2007    OB History   Grav Para Term Preterm Abortions TAB SAB Ect Mult Living                  Review of Systems At least 10pt or greater review of systems completed and are negative except where specified in the HPI.   Allergies  Lisinopril and Norvasc  Home Medications   Current Outpatient Rx  Name  Route  Sig  Dispense  Refill  . aspirin EC 81 MG tablet   Oral   Take 1 tablet (81 mg total) by mouth daily.         . calcium carbonate (TUMS - DOSED IN MG ELEMENTAL CALCIUM) 500 MG chewable tablet   Oral   Chew 4 tablets by mouth 3 (three) times daily with meals.          . nitroGLYCERIN (NITROSTAT) 0.4 MG SL tablet    Sublingual   Place 1 tablet (0.4 mg total) under the tongue every 5 (five) minutes as needed for chest pain.   25 tablet   3     BP 151/97  Pulse 103  Temp(Src) 97.6 F (36.4 C) (Oral)  Resp 20  SpO2 100%  Physical Exam  Nursing notes reviewed.  Electronic medical record reviewed. VITAL SIGNS:   Filed Vitals:   11/12/12 0402  BP: 151/97  Pulse: 103  Temp: 97.6 F (36.4 C)  TempSrc: Oral  Resp: 20  SpO2: 100%   CONSTITUTIONAL: Awake, oriented, appears non-toxic HENT: Atraumatic, normocephalic, oral mucosa pink and moist, airway patent. Nares patent without drainage. External ears normal. EYES: Conjunctiva clear, EOMI, PERRLA NECK: Trachea midline, non-tender, supple CARDIOVASCULAR: Normal heart rate, Normal rhythm, No murmurs, rubs, gallops PULMONARY/CHEST: Clear to auscultation, no rhonchi, wheezes, or rales. Symmetrical breath sounds. Non-tender. ABDOMINAL: Non-distended, soft, non-tender - no rebound or guarding.  BS normal. NEUROLOGIC: Non-focal, moving all four extremities, no gross sensory or motor deficits. EXTREMITIES: No clubbing, cyanosis, or edema. Tenderness to palpation at the semitendinosis tendon on the medial-posterior aspect of her right knee. No tenderness to palpation in the deep vein systems the SKIN: Warm, Dry, No erythema, No rash  ED Course  Procedures (including critical care time)  Labs Reviewed - No data to display Dg Knee 4 Views W/patella Right  11/12/2012  *RADIOLOGY REPORT*  Clinical Data: Pain after injury 2 days ago.  RIGHT KNEE - COMPLETE 4+ VIEW  Comparison: 09/29/2012  Findings: Old osseous fragment adjacent to the medial femoral metaphysis is stable.  No evidence of acute fracture or subluxation.  No patellar subluxation on the sunrise view.  No focal bone lesion or bone destruction.  Bone cortex and trabecular architecture appear intact.  No significant effusion.  No significant change since previous study.  IMPRESSION: No acute bony  abnormalities demonstrated.   Original Report Authenticated By: Lucienne Capers, M.D.      1. Hamstring strain       MDM  X-rays obtained of the patient's right knee including a sunrise view, there is no acute fracture seen. Patient has been walking on the knee, she may have just strained the knee and she has to keep her leg straight in a mobilizer may have simply stretched too far. Patient is on dialysis, and has no other complaints.  Patient has a followup with Dr. supper this week. Patient Wells score is -1-giving her 1+ for immobilization of the lower extremity, and -2 for an alternative diagnosis  is DVT being as likely or more likely-I think a strain of the semi-tendinosis tendon is more likely since she has point tenderness over that anatomical position, no other risk factors for venous thromboembolic disease, no tenderness over the deep vein system. Patient is otherwise well.  We'll discharge the patient home with some pain medicine and followup with her orthopedist this week.       Rhunette Croft, MD 11/12/12 [phone]

## 2012-11-12 NOTE — ED Notes (Signed)
Patient back from x-ray; currently resting in bed.  No respiratory or acute distress noted.  Patient updated on plan of care; informed patient that we are currently waiting on EDP to come and talk about x-ray results.  Patient denies any other needs at this time; will continue to monitor.

## 2012-11-16 ENCOUNTER — Other Ambulatory Visit (INDEPENDENT_AMBULATORY_CARE_PROVIDER_SITE_OTHER): Payer: Medicare Other

## 2012-11-16 LAB — LIPID PANEL
Cholesterol: 140 mg/dL (ref 0–200)
HDL: 44.6 mg/dL (ref >39.00)
LDL Cholesterol: 79 mg/dL (ref 0–99)
Triglycerides: 82 mg/dL (ref 0.0–149.0)

## 2012-11-16 LAB — HEPATIC FUNCTION PANEL
AST: 21 U/L (ref 0–37)
Albumin: 3.4 g/dL — ABNORMAL LOW (ref 3.5–5.2)
Alkaline Phosphatase: 42 U/L (ref 39–117)
Total Protein: 6.9 g/dL (ref 6.0–8.3)

## 2012-11-17 ENCOUNTER — Telehealth: Payer: Self-pay | Admitting: *Deleted

## 2012-11-17 NOTE — Telephone Encounter (Signed)
Message copied by Michae Kava on Fri Nov 17, 2012 11:53 AM ------      Message from: [place], [place] T      Created: Fri Nov 17, 2012  8:25 AM       Not sure why Lipids checked already.  Was supposed to be LFTs only.  But, LDL better.      LFTs ok      Continue with current treatment plan.      Repeat LFTs in 3 mos      Richardson Dopp, [place]  8:24 AM 11/17/2012 ------

## 2012-11-17 NOTE — Telephone Encounter (Signed)
lmom labs better, will recheck lft in 3 months at 5/8/ appt with Dr. Angelena Form, cancelled 3/25 FLP/LFT since done early.Marland Kitchen

## 2012-12-12 ENCOUNTER — Other Ambulatory Visit: Payer: Medicare Other

## 2013-01-04 ENCOUNTER — Other Ambulatory Visit: Payer: Self-pay | Admitting: *Deleted

## 2013-01-04 DIAGNOSIS — T82598A Other mechanical complication of other cardiac and vascular devices and implants, initial encounter: Secondary | ICD-10-CM

## 2013-01-09 ENCOUNTER — Telehealth: Payer: Self-pay | Admitting: Vascular Surgery

## 2013-01-09 NOTE — Telephone Encounter (Addendum)
Message copied by Gena Fray on Tue Jan 09, 2013 10:52 AM ------      Message from: Denman George      Created: Mon Jan 08, 2013  5:13 PM      Regarding: RE: needs earlier appt       She should see an MD.   Are you asking if we should schedule MD appt. and then let the doctor determine the lab at the time of office visit?      ----- Message -----         From: Gena Fray         Sent: 01/08/2013   4:17 PM           To: Sherrye Payor, RN      Subject: RE: needs earlier appt                                   Grace Blight,            05/01 is next Thursday. I do not have anything before that, because the patient is also scheduled for an hour lab. Do you think she just needs to see a MD?            Thanks,            Hinton Dyer      ----- Message -----         From: Sherrye Payor, RN         Sent: 01/08/2013  11:19 AM           To: [facility] Admin Pool      Subject: needs earlier appt                                       Tobe Sos., Renal PA called and requested to move pt's appt. 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  She dialyzes on M-W-F.  Is there any possible appts. earlier?  If so, can you call pt. At home, as she has already left treatment today.             ------  01/09/13: patient aware of appt being moved to 04/25 @ 1:00pm, dpm

## 2013-01-11 ENCOUNTER — Encounter: Payer: Self-pay | Admitting: Vascular Surgery

## 2013-01-12 ENCOUNTER — Ambulatory Visit (INDEPENDENT_AMBULATORY_CARE_PROVIDER_SITE_OTHER): Payer: Medicare Other | Admitting: Vascular Surgery

## 2013-01-12 ENCOUNTER — Other Ambulatory Visit: Payer: Self-pay

## 2013-01-12 ENCOUNTER — Encounter: Payer: Self-pay | Admitting: Vascular Surgery

## 2013-01-12 ENCOUNTER — Encounter (INDEPENDENT_AMBULATORY_CARE_PROVIDER_SITE_OTHER): Payer: Medicare Other | Admitting: *Deleted

## 2013-01-12 VITALS — BP 160/89 | HR 93 | Temp 98.2°F | Resp 18 | Ht 63.5 in | Wt 195.5 lb

## 2013-01-12 DIAGNOSIS — T82598A Other mechanical complication of other cardiac and vascular devices and implants, initial encounter: Secondary | ICD-10-CM | POA: Insufficient documentation

## 2013-01-12 DIAGNOSIS — N186 End stage renal disease: Secondary | ICD-10-CM

## 2013-01-12 DIAGNOSIS — Z4931 Encounter for adequacy testing for hemodialysis: Secondary | ICD-10-CM

## 2013-01-12 NOTE — Progress Notes (Signed)
[facility] [place]  Established Dialysis Access  History of Present Illness  Victoria [name] is a 57 y.o. 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  The patient has had arterial arm replacement due to rupture of the arterial arm of the forearm loop AVG.   She denies any bleeding complications from the venous arm but is worried with a recurrent bleed in that arm.  The patient denies any steal sx.  She denies any recent flow rate issues.  Past Medical History  Diagnosis Date  . Hypertension     Now controlled by HD  . Complication of anesthesia ~ 2011    "they gave me a medicine that swolled me up" (08/23/2012)  . Seasonal allergies   . ESRD on dialysis     HD since approx 1992; "Jeneen Rinks; M, W, F"  . H/O cardiovascular stress test     Myoview 08/24/12: Inferolateral and anteroseptal areas of scar, no findings of ischemia, EF 77%.  . Dialysis patient    Past Surgical History  Procedure Laterality Date  . Parathyroidectomy  2012    With implant to left forearm  . Thyroidectomy, partial  2012    Isthmusectomy   . Total abdominal hysterectomy  2007  . Dilation and curettage of uterus  1970's    "when I was pregnant in my tubes" (08/23/2012)  . Thrombectomy and revision of arterioventous (av) goretex  graft  2012, 2013  . Dialysis fistula creation  1992    "left forearm" (08/23/2012)  . Arteriovenous graft placement  1990's; ~ 2011    "left upper arm; right forearm" (08/23/2012)  . Ankle fracture surgery  ~ 2009    "right" (08/23/2012)  . Revision of arteriovenous goretex graft  09/27/2012    Procedure: REVISION OF ARTERIOVENOUS GORETEX GRAFT;  Surgeon: Mal Misty, MD;  Location: [facility] OR;  Service: Vascular;  Laterality: Right;  1) Replacement of venous half of loop with 65mm Gortex graft  2) Excision of erroded pseudoaneurysm of graft with primary closure.  . Patellectomy  10/03/2012    Procedure: PATELLECTOMY;  Surgeon: Marin Shutter, MD;  Location: [place];  Service: Orthopedics;  Laterality: Right;  RIGHT PARTIAL PATELLECTOMY AND PATELLA TENDON REPAIR  . Patellar tendon repair  10/03/2012    Procedure: PATELLA TENDON REPAIR;  Surgeon: Marin Shutter, MD;  Location: [place];  Service: Orthopedics;  Laterality: Right;   History   Social History  . Marital Status: Married    Spouse Name: N/A    Number of Children: N/A  . Years of Education: N/A   Occupational History  . Not on file.   Social History Main Topics  . Smoking status: Never Smoker   . Smokeless tobacco: Never Used  . Alcohol Use: No     Comment: quit 2007  . Drug Use: No  . Sexually Active: No   Other Topics Concern  . Not on file   Social History Narrative  . No narrative on file    Family History  Problem Relation Age of Onset  . Hypertension Father   . Thyroid disease Father   . Hypertension Sister   . Thyroid disease Sister     Current Outpatient Prescriptions on File Prior to Visit  Medication Sig Dispense Refill  . calcium carbonate (TUMS - DOSED IN MG ELEMENTAL CALCIUM) 500 MG chewable tablet Chew 4 tablets by mouth 3 (three) times daily with meals.       Marland Kitchen  aspirin EC 81 MG tablet Take 1 tablet (81 mg total) by mouth daily.      . nitroGLYCERIN (NITROSTAT) 0.4 MG SL tablet Place 1 tablet (0.4 mg total) under the tongue every 5 (five) minutes as needed for chest pain.  25 tablet  3  . oxyCODONE-acetaminophen (PERCOCET/ROXICET) 5-325 MG per tablet Take 1-2 tablets by mouth every 8 (eight) hours as needed for pain.  17 tablet  0   No current facility-administered medications on file prior to visit.    Allergies  Allergen Reactions  . Lisinopril Swelling and Other (See Comments)    "made my chest hurt"  . Norvasc (Amlodipine Besylate) Swelling   Review of Systems (Positive items checked otherwise negative)  General: [ ]  Weight loss, [ ]  Weight gain, [ ]   Loss of appetite, [ ]  Fever  Neurologic: [ ]  Dizziness, [ ]   Blackouts, [ ]  Headaches, [ ]  Seizure  Ear/Nose/Throat: [ ]  Change in eyesight, [ ]  Change in hearing, [ ]  Nose bleeds, [ ]  Sore throat  Vascular: [ ]  Pain in legs with walking, [ ]  Pain in feet while lying flat, [ ]  Non-healing ulcer, Stroke, [ ]  "Mini stroke", [ ]  Slurred speech, [ ]  Temporary blindness, [ ]  Blood clot in vein, [ ]  Phlebitis  Pulmonary: [ ]  Home oxygen, [ ]  Productive cough, [ ]  Bronchitis, [ ]  Coughing up blood,  [ ]  Asthma, [ ]  Wheezing  Musculoskeletal: [ ]  Arthritis, [ ]  Joint pain, [ ]  Muscle pain  Cardiac: [ ]  Chest pain, [ ]  Chest tightness/pressure, [ ]  Shortness of breath when lying flat, [ ]  Shortness of breath with exertion, [ ]  Palpitations, [ ]  Heart murmur, [ ]  Arrythmia,  [ ]  Atrial fibrillation  Hematologic: [ ]  Bleeding problems, [ ]  Clotting disorder, [ ]  Anemia  Psychiatric:  [ ]  Depression, [ ]  Anxiety, [ ]  Attention deficit disorder  Gastrointestinal:  [ ]  Black stool,[ ]   Blood in stool, [ ]  Peptic ulcer disease, [ ]  Reflux, [ ]  Hiatal hernia, [ ]  Trouble swallowing, [ ]  Diarrhea, [ ]  Constipation  Urinary:  [x]  Kidney disease, [ ]  Burning with urination, [ ]  Frequent urination, [ ]  Difficulty urinating  Skin: [ ]  Ulcers, [ ]  Rashes    Physical Examination  Filed Vitals:   01/12/13 1033  BP: 160/89  Pulse: 93  Temp: 98.2 F (36.8 C)  TempSrc: Oral  Resp: 18  Height: 5' 3.5" (1.613 m)  Weight: 195 lb 8.8 oz (88.7 kg)  SpO2: 98%   Body mass index is 34.09 kg/(m^2).  General: A&O x 3, WD, WN  Pulmonary: Sym exp, good air movt, CTAB, no rales, rhonchi, & wheezing  Cardiac: RRR, Nl S1, S2, no Murmurs, rubs or gallops  Gastrointestinal: soft, NTND, -G/R, - HSM, - masses, - CVAT B  Musculoskeletal: M/S 5/5 throughout , Extremities without  ischemic changes , palpable thrill in R FA AVG, + bruit in access, large pseudoaneurysm over venous arm of arteriovenous graft   Neurologic: Pain and light touch intact in extremities , Motor  exam as listed above  Non-Invasive Vascular Imaging  R arm access duplex  (Date: 01/12/13):   2.30 x 2.62 cm aneurysm in venous arm of AVG  Medical Decision Making  JOHANNA MONAST is a 57 y.o. female who presents with pseudoaneurysm degeneration of R FA AVG, ESRD requiring hemodialysis.   The patient needs revision of R FA AVG with replacement of venous arm of AVG.  This  is scheduled for the 6 MAY 14.  The patient is aware that the risks of access surgery include but are not limited to: bleeding, infection, steal syndrome, nerve damage, ischemic monomelic neuropathy, failure of access to mature, and possible need for additional access procedures in the future.  The patient has agreed to proceed with the above procedure.  Adele Barthel, MD [facility] [place] Office: [phone] Pager: [phone]  01/12/2013, 11:24 AM

## 2013-01-16 ENCOUNTER — Encounter (HOSPITAL_COMMUNITY): Payer: Self-pay | Admitting: Pharmacy Technician

## 2013-01-18 ENCOUNTER — Ambulatory Visit: Payer: Medicare Other | Admitting: Vascular Surgery

## 2013-01-22 ENCOUNTER — Encounter (HOSPITAL_COMMUNITY): Payer: Self-pay | Admitting: *Deleted

## 2013-01-22 MED ORDER — DEXTROSE 5 % IV SOLN
1.5000 g | INTRAVENOUS | Status: AC
  Administered 2013-01-23: 1.5 g via INTRAVENOUS
  Filled 2013-01-22: qty 1.5

## 2013-01-22 MED ORDER — SODIUM CHLORIDE 0.9 % IV SOLN
INTRAVENOUS | Status: DC
  Administered 2013-01-23: 07:00:00 via INTRAVENOUS

## 2013-01-22 NOTE — Progress Notes (Addendum)
Pt is going to see a cardiologist at [facility] on May 27(Dr.McAlhaney)  Stress test report in epic from 08-2013 Echo report in epic from 2007 and 2014 Denies ever having a heart cath  Pt sees kidney docs when issues  EKG in epic from 10-2012 CXR in epic from 10-03-12

## 2013-01-23 ENCOUNTER — Encounter (HOSPITAL_COMMUNITY)
Admission: RE | Disposition: A | Discharge status: Home / Self Care | Payer: Self-pay | Source: Ambulatory Visit | Attending: Vascular Surgery

## 2013-01-23 ENCOUNTER — Telehealth: Payer: Self-pay | Admitting: Vascular Surgery

## 2013-01-23 ENCOUNTER — Encounter (HOSPITAL_COMMUNITY): Payer: Self-pay | Admitting: Anesthesiology

## 2013-01-23 ENCOUNTER — Ambulatory Visit (HOSPITAL_COMMUNITY): Payer: Medicare Other | Admitting: Anesthesiology

## 2013-01-23 ENCOUNTER — Ambulatory Visit (HOSPITAL_COMMUNITY)
Admission: RE | Admit: 2013-01-23 | Discharge: 2013-01-23 | Disposition: A | Discharge status: Home / Self Care | Payer: Medicare Other | Source: Ambulatory Visit | Attending: Vascular Surgery | Admitting: Vascular Surgery

## 2013-01-23 DIAGNOSIS — T82898A Other specified complication of vascular prosthetic devices, implants and grafts, initial encounter: Secondary | ICD-10-CM

## 2013-01-23 DIAGNOSIS — Z992 Dependence on renal dialysis: Secondary | ICD-10-CM | POA: Insufficient documentation

## 2013-01-23 DIAGNOSIS — I868 Varicose veins of other specified sites: Secondary | ICD-10-CM | POA: Insufficient documentation

## 2013-01-23 DIAGNOSIS — N186 End stage renal disease: Secondary | ICD-10-CM | POA: Insufficient documentation

## 2013-01-23 DIAGNOSIS — Z888 Allergy status to other drugs, medicaments and biological substances status: Secondary | ICD-10-CM | POA: Insufficient documentation

## 2013-01-23 DIAGNOSIS — Z7982 Long term (current) use of aspirin: Secondary | ICD-10-CM | POA: Insufficient documentation

## 2013-01-23 DIAGNOSIS — Z79899 Other long term (current) drug therapy: Secondary | ICD-10-CM | POA: Insufficient documentation

## 2013-01-23 DIAGNOSIS — Y832 Surgical operation with anastomosis, bypass or graft as the cause of abnormal reaction of the patient, or of later complication, without mention of misadventure at the time of the procedure: Secondary | ICD-10-CM | POA: Insufficient documentation

## 2013-01-23 DIAGNOSIS — I999 Unspecified disorder of circulatory system: Secondary | ICD-10-CM | POA: Insufficient documentation

## 2013-01-23 DIAGNOSIS — I120 Hypertensive chronic kidney disease with stage 5 chronic kidney disease or end stage renal disease: Secondary | ICD-10-CM | POA: Insufficient documentation

## 2013-01-23 HISTORY — PX: REVISION OF ARTERIOVENOUS GORETEX GRAFT: SHX6073

## 2013-01-23 LAB — POCT I-STAT 4, (NA,K, GLUC, HGB,HCT)
HCT: 35 % — ABNORMAL LOW (ref 36.0–46.0)
Sodium: 143 mEq/L (ref 135–145)

## 2013-01-23 LAB — SURGICAL PCR SCREEN
MRSA, PCR: NEGATIVE
Staphylococcus aureus: NEGATIVE

## 2013-01-23 SURGERY — REVISION OF ARTERIOVENOUS GORETEX GRAFT
Anesthesia: General | Site: Arm Lower | Laterality: Right | Wound class: Clean

## 2013-01-23 MED ORDER — LIDOCAINE HCL (CARDIAC) 20 MG/ML IV SOLN
INTRAVENOUS | Status: DC | PRN
  Administered 2013-01-23: 80 mg via INTRAVENOUS

## 2013-01-23 MED ORDER — THROMBIN 20000 UNITS EX SOLR: OROMUCOSAL | Status: DC | PRN

## 2013-01-23 MED ORDER — PHENYLEPHRINE HCL 10 MG/ML IJ SOLN
INTRAMUSCULAR | Status: DC | PRN
  Administered 2013-01-23 (×10): 80 ug via INTRAVENOUS

## 2013-01-23 MED ORDER — FENTANYL CITRATE 0.05 MG/ML IJ SOLN: 25.0000 ug | INTRAMUSCULAR | Status: DC | PRN

## 2013-01-23 MED ORDER — HEPARIN SODIUM (PORCINE) 1000 UNIT/ML IJ SOLN
INTRAMUSCULAR | Status: DC | PRN
  Administered 2013-01-23: 7000 [IU] via INTRAVENOUS

## 2013-01-23 MED ORDER — OXYCODONE HCL 5 MG PO TABS
ORAL_TABLET | ORAL | Status: AC
  Filled 2013-01-23: qty 1

## 2013-01-23 MED ORDER — LIDOCAINE-EPINEPHRINE (PF) 1 %-1:200000 IJ SOLN
INTRAMUSCULAR | Status: AC
  Filled 2013-01-23: qty 10

## 2013-01-23 MED ORDER — OXYCODONE HCL 5 MG PO TABS
5.0000 mg | ORAL_TABLET | Freq: Once | ORAL | Status: AC | PRN
  Administered 2013-01-23: 5 mg via ORAL

## 2013-01-23 MED ORDER — LIDOCAINE-EPINEPHRINE (PF) 1 %-1:200000 IJ SOLN
INTRAMUSCULAR | Status: DC | PRN
  Administered 2013-01-23: 30 mL via INTRADERMAL

## 2013-01-23 MED ORDER — PROTAMINE SULFATE 10 MG/ML IV SOLN
INTRAVENOUS | Status: DC | PRN
  Administered 2013-01-23: 30 mg via INTRAVENOUS

## 2013-01-23 MED ORDER — BUPIVACAINE HCL (PF) 0.25 % IJ SOLN
INTRAMUSCULAR | Status: AC
  Filled 2013-01-23: qty 30

## 2013-01-23 MED ORDER — 0.9 % SODIUM CHLORIDE (POUR BTL) OPTIME
TOPICAL | Status: DC | PRN
  Administered 2013-01-23: 1000 mL

## 2013-01-23 MED ORDER — MUPIROCIN CALCIUM 2 % EX CREA: TOPICAL_CREAM | Freq: Two times a day (BID) | CUTANEOUS | Status: DC

## 2013-01-23 MED ORDER — OXYCODONE-ACETAMINOPHEN 5-325 MG PO TABS: 1.0000 | ORAL_TABLET | Freq: Three times a day (TID) | ORAL | Status: DC | PRN

## 2013-01-23 MED ORDER — PHENYLEPHRINE HCL 10 MG/ML IJ SOLN
10.0000 mg | INTRAVENOUS | Status: DC | PRN
  Administered 2013-01-23: 20 ug/min via INTRAVENOUS

## 2013-01-23 MED ORDER — ONDANSETRON HCL 4 MG/2ML IJ SOLN: 4.0000 mg | Freq: Four times a day (QID) | INTRAMUSCULAR | Status: DC | PRN

## 2013-01-23 MED ORDER — SODIUM CHLORIDE 0.9 % IR SOLN
Status: DC | PRN
  Administered 2013-01-23: 09:00:00

## 2013-01-23 MED ORDER — BUPIVACAINE HCL (PF) 0.25 % IJ SOLN
INTRAMUSCULAR | Status: DC | PRN
  Administered 2013-01-23: 30 mL

## 2013-01-23 MED ORDER — PROPOFOL 10 MG/ML IV BOLUS
INTRAVENOUS | Status: DC | PRN
  Administered 2013-01-23: 150 mg via INTRAVENOUS

## 2013-01-23 MED ORDER — MUPIROCIN 2 % EX OINT
TOPICAL_OINTMENT | CUTANEOUS | Status: AC
  Administered 2013-01-23: 1 via NASAL
  Filled 2013-01-23: qty 22

## 2013-01-23 MED ORDER — THROMBIN 20000 UNITS EX SOLR
OROMUCOSAL | Status: DC | PRN
  Administered 2013-01-23: 09:00:00 via TOPICAL

## 2013-01-23 MED ORDER — MIDAZOLAM HCL 5 MG/5ML IJ SOLN
INTRAMUSCULAR | Status: DC | PRN
  Administered 2013-01-23: 2 mg via INTRAVENOUS

## 2013-01-23 MED ORDER — THROMBIN 20000 UNITS EX SOLR
CUTANEOUS | Status: AC
  Filled 2013-01-23: qty 20000

## 2013-01-23 MED ORDER — FENTANYL CITRATE 0.05 MG/ML IJ SOLN
INTRAMUSCULAR | Status: DC | PRN
  Administered 2013-01-23 (×4): 50 ug via INTRAVENOUS

## 2013-01-23 MED ORDER — OXYCODONE HCL 5 MG/5ML PO SOLN: 5.0000 mg | Freq: Once | ORAL | Status: AC | PRN

## 2013-01-23 SURGICAL SUPPLY — 44 items
ADH SKN CLS APL DERMABOND .7 (GAUZE/BANDAGES/DRESSINGS) ×1
BANDAGE ESMARK 6X9 LF (GAUZE/BANDAGES/DRESSINGS) IMPLANT
BNDG CMPR 9X6 STRL LF SNTH (GAUZE/BANDAGES/DRESSINGS) ×1
BNDG ESMARK 6X9 LF (GAUZE/BANDAGES/DRESSINGS) ×2
CANISTER SUCTION 2500CC (MISCELLANEOUS) ×2 IMPLANT
CLIP TI MEDIUM 6 (CLIP) ×2 IMPLANT
CLIP TI WIDE RED SMALL 6 (CLIP) ×2 IMPLANT
CLOTH BEACON ORANGE TIMEOUT ST (SAFETY) ×2 IMPLANT
COVER SURGICAL LIGHT HANDLE (MISCELLANEOUS) ×2 IMPLANT
CUFF TOURNIQUET SINGLE 18IN (TOURNIQUET CUFF) ×1 IMPLANT
DECANTER SPIKE VIAL GLASS SM (MISCELLANEOUS) ×2 IMPLANT
DERMABOND ADVANCED (GAUZE/BANDAGES/DRESSINGS) ×1
DERMABOND ADVANCED .7 DNX12 (GAUZE/BANDAGES/DRESSINGS) ×1 IMPLANT
ELECT REM PT RETURN 9FT ADLT (ELECTROSURGICAL) ×2
ELECTRODE REM PT RTRN 9FT ADLT (ELECTROSURGICAL) ×1 IMPLANT
GLOVE BIO SURGEON STRL SZ7 (GLOVE) ×2 IMPLANT
GLOVE BIOGEL PI IND STRL 6.5 (GLOVE) IMPLANT
GLOVE BIOGEL PI IND STRL 7.0 (GLOVE) IMPLANT
GLOVE BIOGEL PI IND STRL 7.5 (GLOVE) ×1 IMPLANT
GLOVE BIOGEL PI INDICATOR 6.5 (GLOVE) ×2
GLOVE BIOGEL PI INDICATOR 7.0 (GLOVE) ×2
GLOVE BIOGEL PI INDICATOR 7.5 (GLOVE) ×1
GLOVE ECLIPSE 7.0 STRL STRAW (GLOVE) ×1 IMPLANT
GOWN PREVENTION PLUS XLARGE (GOWN DISPOSABLE) ×1 IMPLANT
GOWN STRL NON-REIN LRG LVL3 (GOWN DISPOSABLE) ×4 IMPLANT
GRAFT GORETEX STND 6X20 (Vascular Products) ×2 IMPLANT
GRAFT GORETEXSTD 6X20 (Vascular Products) IMPLANT
KIT BASIN OR (CUSTOM PROCEDURE TRAY) ×2 IMPLANT
KIT ROOM TURNOVER OR (KITS) ×2 IMPLANT
NS IRRIG 1000ML POUR BTL (IV SOLUTION) ×2 IMPLANT
PACK CV ACCESS (CUSTOM PROCEDURE TRAY) ×2 IMPLANT
PAD ARMBOARD 7.5X6 YLW CONV (MISCELLANEOUS) ×4 IMPLANT
SPONGE SURGIFOAM ABS GEL 100 (HEMOSTASIS) IMPLANT
SUT MNCRL AB 4-0 PS2 18 (SUTURE) ×2 IMPLANT
SUT PROLENE 5 0 C 1 24 (SUTURE) ×1 IMPLANT
SUT PROLENE 5 0 C 1 36 (SUTURE) ×1 IMPLANT
SUT PROLENE 6 0 BV (SUTURE) ×4 IMPLANT
SUT PROLENE 7 0 BV 1 (SUTURE) IMPLANT
SUT VIC AB 3-0 SH 27 (SUTURE) ×4
SUT VIC AB 3-0 SH 27X BRD (SUTURE) ×2 IMPLANT
TOWEL OR 17X24 6PK STRL BLUE (TOWEL DISPOSABLE) ×2 IMPLANT
TOWEL OR 17X26 10 PK STRL BLUE (TOWEL DISPOSABLE) ×2 IMPLANT
UNDERPAD 30X30 INCONTINENT (UNDERPADS AND DIAPERS) ×2 IMPLANT
WATER STERILE IRR 1000ML POUR (IV SOLUTION) ×2 IMPLANT

## 2013-01-23 NOTE — Op Note (Signed)
OPERATIVE NOTE   PROCEDURE:  Revision of right forearm loop arteriovenous graft (replacement of arterial limb)  PRE-OPERATIVE DIAGNOSIS: Right forearm loop arteriovenous graft pseudoaneurysm  POST-OPERATIVE DIAGNOSIS: same as above   SURGEON: Adele Barthel, MD  ANESTHESIA: general  ESTIMATED BLOOD LOSS: 100 cc  FINDING(S): 1. Palpable thrill at end of the case 2. Dopplerable right radial signal  SPECIMEN(S):  none  INDICATIONS:   Victoria [name] is a 57 y.o. female who presents with enlarging pseudoaneurysm in right forearm loop arteriovenous graft.  Risk, benefits, and alternatives to access surgery were discussed.  The patient is aware the risks include but are not limited to: bleeding, infection, steal syndrome, nerve damage, ischemic monomelic neuropathy, failure to mature, and need for additional procedures.  The patient is aware of the risks and elects to proceed forward.  DESCRIPTION: After full informed written consent was obtained from the patient, the patient was brought back to the operating room and placed supine upon the operating table.  The patient was given IV antibiotics prior to proceeding.  After obtaining adequate sedation, the patient was prepped and draped in standard fashion for a right arm access procedure.  I gave 7000 units of Heparin intravenously.  I placed a sterile dressing around the upper arm and then placed a sterile tourniquet around the left arm.  I exsanguinated the left arm with an Esmark bandage and then inflated the tourniquet to 250 mm Hg.    I marked the donor (proximal arterial arm) and recipient (apex) sites for the replacement graft .  I injected 10 cc of a mixture of 1% lidocaine with epinephrine and 0.5% Marcaine without epinephrine at these two areas.  I made an incision over the proximal arterial site and the apical site.  I dissected out the graft at both locations with electrocautery.  I clamped the graft proximally and distally.  The  tourniquet was released at 14 minutes.  Using a curved metal tunneler, I dissected a new tunnel for the replacement graft, lateral to the two previous segments.  I placed a 6 mm Goretex graft through the metal tunnel.  I transected the previous graft in the arterial arm exposure and spatulated the graft for an end-to-end anastomosis.  I spatulated the new Goretex graft and sewed the new graft to the old graft in an end-to-end fashion with a running stitch of 5-0 Prolene.  I moved the clamp distal to this new anastomosis and placed thrombin and gelfoam in the wound.    I then pulled the graft to appropriate tension in the apical exposure.  I transected the old graft in this exposure and then spatulated this graft for an end-to-end anastomosis.  I then spatulated the graft, shortening the graft in the process.  The new graft was sewn to the old graft with a running stitch of 5-0 Prolene in an end-to-end configuration.  Prior to completion of this procedure, I allowed the venous end to backbleed: vigorous backbleeding was present.  The arterial end was allowed to bleed also: vigorous backbleeding was present.  I completed the anastomosis in the usual fashion.    There was a weakly palpable thrill in the arteriovenous graft.  The radial artery was dopplerable.  I placed thrombin and Gelfoam in both incisions.  I also gave 30 mg of Protamine.  After waiting a few minutes, I removed all the thrombin and Gelfoam and washed out the wound.  There was no more active bleeding.  The subcutaneous tissue was repaired  with running stitch of 3-0 Vicryl in both incisions.  The skin was then reapproximated with running subcuticular 4-0 Monocryl in both incisions.  The skin was then cleaned, dried, and then the skin closure was reinforced with Dermabond.    COMPLICATIONS: none  CONDITION: stable   CHEN,BRIAN LIANG-YU, MD 01/23/2013 9:15 AM

## 2013-01-23 NOTE — Progress Notes (Signed)
Swelling at surgical site has not gotten worse since arrival in [place]. Patient verbalized understanding of instructions regarding when to call MD.

## 2013-01-23 NOTE — Progress Notes (Signed)
Report given to philip rn as caregiver 

## 2013-01-23 NOTE — Progress Notes (Signed)
Swelling noted to right arm per Doren Custard in PACU MD is aware. Pt with good thrill and bruit in right arm at site. Cap refill < 2 seconds, radial pulse present.

## 2013-01-23 NOTE — Anesthesia Postprocedure Evaluation (Signed)
Anesthesia Post Note  Patient: Victoria [name]  Procedure(s) Performed: Procedure(s) (LRB): REVISION OF ARTERIOVENOUS GORETEX GRAFT (Right)  Anesthesia type: General  Patient location: PACU  Post pain: Pain level controlled and Adequate analgesia  Post assessment: Post-op Vital signs reviewed, Patient's Cardiovascular Status Stable, Respiratory Function Stable, Patent Airway and Pain level controlled  Last Vitals:  Filed Vitals:   01/23/13 1000  BP:   Pulse: 81  Temp:   Resp: 16    Post vital signs: Reviewed and stable  Level of consciousness: awake, alert  and oriented  Complications: No apparent anesthesia complications

## 2013-01-23 NOTE — Preoperative (Signed)
Beta Blockers   Reason not to administer Beta Blockers:Not Applicable 

## 2013-01-23 NOTE — Telephone Encounter (Addendum)
Message copied by Doristine Section on Tue Jan 23, 2013 11:07 AM ------      Message from: Richrd Prime      Created: Tue Jan 23, 2013  8:36 AM       4 week F/U revision AVGG - chen ------  notified patient of fu appt. on 03-02-13 8:30 with dr. Bridgett Larsson

## 2013-01-23 NOTE — H&P (View-Only) (Signed)
[facility] [place]  Established Dialysis Access  History of Present Illness  Victoria [name] is a 57 y.o. 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  The patient has had arterial arm replacement due to rupture of the arterial arm of the forearm loop AVG.   She denies any bleeding complications from the venous arm but is worried with a recurrent bleed in that arm.  The patient denies any steal sx.  She denies any recent flow rate issues.  Past Medical History  Diagnosis Date  . Hypertension     Now controlled by HD  . Complication of anesthesia ~ 2011    "they gave me a medicine that swolled me up" (08/23/2012)  . Seasonal allergies   . ESRD on dialysis     HD since approx 1992; "[facility]; M, W, F"  . H/O cardiovascular stress test     Myoview 08/24/12: Inferolateral and anteroseptal areas of scar, no findings of ischemia, EF 77%.  . Dialysis patient    Past Surgical History  Procedure Laterality Date  . Parathyroidectomy  2012    With implant to left forearm  . Thyroidectomy, partial  2012    Isthmusectomy   . Total abdominal hysterectomy  2007  . Dilation and curettage of uterus  1970's    "when I was pregnant in my tubes" (08/23/2012)  . Thrombectomy and revision of arterioventous (av) goretex  graft  2012, 2013  . Dialysis fistula creation  1992    "left forearm" (08/23/2012)  . Arteriovenous graft placement  1990's; ~ 2011    "left upper arm; right forearm" (08/23/2012)  . Ankle fracture surgery  ~ 2009    "right" (08/23/2012)  . Revision of arteriovenous goretex graft  09/27/2012    Procedure: REVISION OF ARTERIOVENOUS GORETEX GRAFT;  Surgeon: Mal Misty, MD;  Location: [facility] OR;  Service: Vascular;  Laterality: Right;  1) Replacement of venous half of loop with 23mm Gortex graft  2) Excision of erroded pseudoaneurysm of graft with primary closure.  . Patellectomy  10/03/2012    Procedure: PATELLECTOMY;  Surgeon: Marin Shutter, MD;  Location: [place];  Service: Orthopedics;  Laterality: Right;  RIGHT PARTIAL PATELLECTOMY AND PATELLA TENDON REPAIR  . Patellar tendon repair  10/03/2012    Procedure: PATELLA TENDON REPAIR;  Surgeon: Marin Shutter, MD;  Location: [place];  Service: Orthopedics;  Laterality: Right;   History   Social History  . Marital Status: Married    Spouse Name: N/A    Number of Children: N/A  . Years of Education: N/A   Occupational History  . Not on file.   Social History Main Topics  . Smoking status: Never Smoker   . Smokeless tobacco: Never Used  . Alcohol Use: No     Comment: quit 2007  . Drug Use: No  . Sexually Active: No   Other Topics Concern  . Not on file   Social History Narrative  . No narrative on file    Family History  Problem Relation Age of Onset  . Hypertension Father   . Thyroid disease Father   . Hypertension Sister   . Thyroid disease Sister     Current Outpatient Prescriptions on File Prior to Visit  Medication Sig Dispense Refill  . calcium carbonate (TUMS - DOSED IN MG ELEMENTAL CALCIUM) 500 MG chewable tablet Chew 4 tablets by mouth 3 (three) times daily with meals.       Marland Kitchen  aspirin EC 81 MG tablet Take 1 tablet (81 mg total) by mouth daily.      . nitroGLYCERIN (NITROSTAT) 0.4 MG SL tablet Place 1 tablet (0.4 mg total) under the tongue every 5 (five) minutes as needed for chest pain.  25 tablet  3  . oxyCODONE-acetaminophen (PERCOCET/ROXICET) 5-325 MG per tablet Take 1-2 tablets by mouth every 8 (eight) hours as needed for pain.  17 tablet  0   No current facility-administered medications on file prior to visit.    Allergies  Allergen Reactions  . Lisinopril Swelling and Other (See Comments)    "made my chest hurt"  . Norvasc (Amlodipine Besylate) Swelling   Review of Systems (Positive items checked otherwise negative)  General: [ ]  Weight loss, [ ]  Weight gain, [ ]   Loss of appetite, [ ]  Fever  Neurologic: [ ]  Dizziness, [ ]   Blackouts, [ ]  Headaches, [ ]  Seizure  Ear/Nose/Throat: [ ]  Change in eyesight, [ ]  Change in hearing, [ ]  Nose bleeds, [ ]  Sore throat  Vascular: [ ]  Pain in legs with walking, [ ]  Pain in feet while lying flat, [ ]  Non-healing ulcer, Stroke, [ ]  "Mini stroke", [ ]  Slurred speech, [ ]  Temporary blindness, [ ]  Blood clot in vein, [ ]  Phlebitis  Pulmonary: [ ]  Home oxygen, [ ]  Productive cough, [ ]  Bronchitis, [ ]  Coughing up blood,  [ ]  Asthma, [ ]  Wheezing  Musculoskeletal: [ ]  Arthritis, [ ]  Joint pain, [ ]  Muscle pain  Cardiac: [ ]  Chest pain, [ ]  Chest tightness/pressure, [ ]  Shortness of breath when lying flat, [ ]  Shortness of breath with exertion, [ ]  Palpitations, [ ]  Heart murmur, [ ]  Arrythmia,  [ ]  Atrial fibrillation  Hematologic: [ ]  Bleeding problems, [ ]  Clotting disorder, [ ]  Anemia  Psychiatric:  [ ]  Depression, [ ]  Anxiety, [ ]  Attention deficit disorder  Gastrointestinal:  [ ]  Black stool,[ ]   Blood in stool, [ ]  Peptic ulcer disease, [ ]  Reflux, [ ]  Hiatal hernia, [ ]  Trouble swallowing, [ ]  Diarrhea, [ ]  Constipation  Urinary:  [x]  Kidney disease, [ ]  Burning with urination, [ ]  Frequent urination, [ ]  Difficulty urinating  Skin: [ ]  Ulcers, [ ]  Rashes    Physical Examination  Filed Vitals:   01/12/13 1033  BP: 160/89  Pulse: 93  Temp: 98.2 F (36.8 C)  TempSrc: Oral  Resp: 18  Height: 5' 3.5" (1.613 m)  Weight: 195 lb 8.8 oz (88.7 kg)  SpO2: 98%   Body mass index is 34.09 kg/(m^2).  General: A&O x 3, WD, WN  Pulmonary: Sym exp, good air movt, CTAB, no rales, rhonchi, & wheezing  Cardiac: RRR, Nl S1, S2, no Murmurs, rubs or gallops  Gastrointestinal: soft, NTND, -G/R, - HSM, - masses, - CVAT B  Musculoskeletal: M/S 5/5 throughout , Extremities without  ischemic changes , palpable thrill in R FA AVG, + bruit in access, large pseudoaneurysm over venous arm of arteriovenous graft   Neurologic: Pain and light touch intact in extremities , Motor  exam as listed above  Non-Invasive Vascular Imaging  R arm access duplex  (Date: 01/12/13):   2.30 x 2.62 cm aneurysm in venous arm of AVG  Medical Decision Making  Victoria [name] is a 57 y.o. female who presents with pseudoaneurysm degeneration of R FA AVG, ESRD requiring hemodialysis.   The patient needs revision of R FA AVG with replacement of venous arm of AVG.  This  is scheduled for the 6 MAY 14.  The patient is aware that the risks of access surgery include but are not limited to: bleeding, infection, steal syndrome, nerve damage, ischemic monomelic neuropathy, failure of access to mature, and possible need for additional access procedures in the future.  The patient has agreed to proceed with the above procedure.  Adele Barthel, MD [facility] [place] Office: [phone] Pager: [phone]  01/12/2013, 11:24 AM

## 2013-01-23 NOTE — Anesthesia Preprocedure Evaluation (Addendum)
Anesthesia Evaluation  Patient identified by MRN, date of birth, ID band Patient awake    Reviewed: Allergy & Precautions, H&P , NPO status , Patient's Chart, lab work & pertinent test results  Airway Mallampati: II TM Distance: >3 FB Neck ROM: full    Dental  (+) Dental Advisory Given and Teeth Intact   Pulmonary neg pulmonary ROS,          Cardiovascular hypertension, Pt. on medications     Neuro/Psych negative neurological ROS  negative psych ROS   GI/Hepatic   Endo/Other    Renal/GU ESRF and DialysisRenal disease     Musculoskeletal negative musculoskeletal ROS (+)   Abdominal   Peds  Hematology negative hematology ROS (+)   Anesthesia Other Findings   Reproductive/Obstetrics negative OB ROS                         Anesthesia Physical Anesthesia Plan  ASA: III  Anesthesia Plan: General   Post-op Pain Management:    Induction: Intravenous  Airway Management Planned: LMA  Additional Equipment:   Intra-op Plan:   Post-operative Plan:   Informed Consent: I have reviewed the patients History and Physical, chart, labs and discussed the procedure including the risks, benefits and alternatives for the proposed anesthesia with the patient or authorized representative who has indicated his/her understanding and acceptance.     Plan Discussed with: CRNA, Anesthesiologist and Surgeon  Anesthesia Plan Comments:         Anesthesia Quick Evaluation

## 2013-01-23 NOTE — Interval H&P Note (Signed)
[facility] [place]  History and Physical Update  The patient was interviewed and re-examined.  The patient's previous History and Physical has been reviewed and is unchanged.  There is no change in the plan of care: revision of graft with replacement of venous arm.  Adele Barthel, MD [facility] [place] Office: [phone] Pager: [phone]  01/23/2013, 7:21 AM

## 2013-01-23 NOTE — Transfer of Care (Signed)
Immediate Anesthesia Transfer of Care Note  Patient: Victoria [name]  Procedure(s) Performed: Procedure(s) with comments: REVISION OF ARTERIOVENOUS GORETEX GRAFT (Right) - Using piece of 19mm x 20cm Gortex graft.   Patient Location: PACU  Anesthesia Type:General  Level of Consciousness: awake, alert  and oriented  Airway & Oxygen Therapy: Patient Spontanous Breathing and Patient connected to nasal cannula oxygen  Post-op Assessment: Report given to PACU RN and Post -op Vital signs reviewed and stable  Post vital signs: Reviewed and stable  Complications: No apparent anesthesia complications

## 2013-01-24 ENCOUNTER — Encounter (HOSPITAL_COMMUNITY): Payer: Self-pay | Admitting: Vascular Surgery

## 2013-01-25 ENCOUNTER — Other Ambulatory Visit: Payer: Medicare Other

## 2013-01-25 ENCOUNTER — Ambulatory Visit: Payer: Medicare Other | Admitting: Cardiovascular Disease

## 2013-03-02 ENCOUNTER — Ambulatory Visit: Payer: Medicare Other | Admitting: Vascular Surgery

## 2013-03-29 ENCOUNTER — Other Ambulatory Visit: Payer: Medicare Other

## 2013-03-29 ENCOUNTER — Encounter: Payer: Self-pay | Admitting: Vascular Surgery

## 2013-03-29 ENCOUNTER — Ambulatory Visit (INDEPENDENT_AMBULATORY_CARE_PROVIDER_SITE_OTHER): Payer: Medicare Other | Admitting: Cardiovascular Disease

## 2013-03-29 ENCOUNTER — Encounter: Payer: Self-pay | Admitting: Cardiovascular Disease

## 2013-03-29 VITALS — BP 101/67 | HR 76 | Wt 197.0 lb

## 2013-03-29 DIAGNOSIS — R9439 Abnormal result of other cardiovascular function study: Secondary | ICD-10-CM

## 2013-03-29 DIAGNOSIS — I340 Nonrheumatic mitral (valve) insufficiency: Secondary | ICD-10-CM

## 2013-03-29 DIAGNOSIS — I059 Rheumatic mitral valve disease, unspecified: Secondary | ICD-10-CM

## 2013-03-29 NOTE — Patient Instructions (Addendum)
Your physician wants you to follow-up in: 1 year  You will receive a reminder letter in the mail two months in advance. If you don't receive a letter, please call our office to schedule the follow-up appointment.  Your physician recommends that you continue on your current medications as directed. Please refer to the Current Medication list given to you today.  

## 2013-03-29 NOTE — Progress Notes (Signed)
History of Present Illness: 57 yo female with history of ESRD on HD, HTN who is here today for cardiac follow up. She was admitted December 2013 with chest pain. Stress myoview 08/24/12 with possible inferolateral and anteroseptal scar but no ischemia, LVEF 77%. Echo 10/31/12 with normal wall motion, normal LV function, mild MR.   She is here today for follow up. She has been feeling well.   Primary Care Physician: Deterding  Last Lipid Profile:Lipid Panel     Component Value Date/Time   CHOL 140 11/16/2012 0845   TRIG 82.0 11/16/2012 0845   HDL 44.60 11/16/2012 0845   CHOLHDL 3 11/16/2012 0845   VLDL 16.4 11/16/2012 0845   LDLCALC 79 11/16/2012 0845     Past Medical History  Diagnosis Date  . Seasonal allergies   . H/O cardiovascular stress test     Myoview 08/24/12: Inferolateral and anteroseptal areas of scar, no findings of ischemia, EF 77%.  . Dialysis patient   . Complication of anesthesia ~ 2011    "they gave me a medicine that swolled me and mouth burning up" (08/23/2012)  . Hypertension     Now controlled by HD  . History of bronchitis     questions if she has it now bc throat is scratchy and coughing  . ESRD on dialysis     M/W/F on [facility]    Past Surgical History  Procedure Laterality Date  . Parathyroidectomy  2012    With implant to left forearm  . Thyroidectomy, partial  2012    Isthmusectomy   . Total abdominal hysterectomy  2007  . Dilation and curettage of uterus  1970's    "when I was pregnant in my tubes" (08/23/2012)  . Thrombectomy and revision of arterioventous (av) goretex  graft  2012, 2013  . Dialysis fistula creation  1992    "left forearm" (08/23/2012)  . Arteriovenous graft placement  1990's; ~ 2011    "left upper arm; right forearm" (08/23/2012)  . Ankle fracture surgery  ~ 2009    "right" (08/23/2012)  . Revision of arteriovenous goretex graft  09/27/2012    Procedure: REVISION OF ARTERIOVENOUS GORETEX GRAFT;  Surgeon: Mal Misty, MD;   Location: [facility] OR;  Service: Vascular;  Laterality: Right;  1) Replacement of venous half of loop with 51mm Gortex graft  2) Excision of erroded pseudoaneurysm of graft with primary closure.  . Patellectomy  10/03/2012    Procedure: PATELLECTOMY;  Surgeon: Marin Shutter, MD;  Location: [place];  Service: Orthopedics;  Laterality: Right;  RIGHT PARTIAL PATELLECTOMY AND PATELLA TENDON REPAIR  . Patellar tendon repair  10/03/2012    Procedure: PATELLA TENDON REPAIR;  Surgeon: Marin Shutter, MD;  Location: [place];  Service: Orthopedics;  Laterality: Right;  . Revision of arteriovenous goretex graft Right 01/23/2013    Procedure: REVISION OF ARTERIOVENOUS GORETEX GRAFT;  Surgeon: Conrad Sneads [place]erry, MD;  Location: [place];  Service: Vascular;  Laterality: Right;  Using piece of 76mm x 20cm Gortex graft.     Current Outpatient Prescriptions  Medication Sig Dispense Refill  . aspirin EC 81 MG tablet Take 1 tablet (81 mg total) by mouth daily.      . calcium carbonate (TUMS - DOSED IN MG ELEMENTAL CALCIUM) 500 MG chewable tablet Chew 4 tablets by mouth 3 (three) times daily with meals.       . nitroGLYCERIN (NITROSTAT) 0.4 MG SL tablet Place 1 tablet (0.4 mg total) under the tongue  every 5 (five) minutes as needed for chest pain.  25 tablet  3  . oxyCODONE-acetaminophen (PERCOCET/ROXICET) 5-325 MG per tablet Take 1-2 tablets by mouth every 8 (eight) hours as needed for pain.  30 tablet  0   No current facility-administered medications for this visit.    Allergies  Allergen Reactions  . Lisinopril Swelling and Other (See Comments)    "made my chest hurt"  . Norvasc (Amlodipine Besylate) Swelling    History   Social History  . Marital Status: Married    Spouse Name: N/A    Number of Children: N/A  . Years of Education: N/A   Occupational History  . Not on file.   Social History Main Topics  . Smoking status: Never Smoker   . Smokeless tobacco: Never Used  . Alcohol Use: No     Comment: quit 2007  .  Drug Use: No  . Sexually Active: Yes   Other Topics Concern  . Not on file   Social History Narrative  . No narrative on file    Family History  Problem Relation Age of Onset  . Hypertension Father   . Thyroid disease Father   . Hypertension Sister   . Thyroid disease Sister     Review of Systems:  As stated in the HPI and otherwise negative.   BP 101/67  Pulse 76  Wt 197 lb (89.359 kg)  BMI 34.35 kg/m2  Physical Examination: General: Well developed, well nourished, NAD HEENT: OP clear, mucus membranes moist SKIN: warm, dry. No rashes. Neuro: No focal deficits Musculoskeletal: Muscle strength 5/5 all ext Psychiatric: Mood and affect normal Neck: No JVD, no carotid bruits, no thyromegaly, no lymphadenopathy. Lungs:Clear bilaterally, no wheezes, rhonci, crackles Cardiovascular: Regular rate and rhythm. No murmurs, gallops or rubs. Abdomen:Soft. Bowel sounds present. Non-tender.  Extremities: No lower extremity edema. Pulses are 2 + in the bilateral DP/PT.  EKG: NSR, rate 76 bpm. LAFB. Possible old inferior and anterolateral MI based on Q waves. Unchanged from 2013.  Stress myoview 08/24/12: Findings: The SPECT stress and rest images demonstrate two areas of decreased activity in the left ventricle on both the stress and rest images. These are located in the inferolateral wall and anteroseptal wall. These likely reflect areas of scarring. No reversible defects to suggest ischemia. The gated SPECT images demonstrate normal wall motion and myocardial thickening with contraction. The estimated ejection fraction was 77%. IMPRESSION:  1. Inferolateral and anteroseptal areas of scar. 2. No findings for myocardial ischemia. 3. Normal wall motion and calculated ejection fraction at 77%.  Echo 10/31/12: Left ventricle: The cavity size was normal. Wall thickness was increased in a pattern of mild LVH. Systolic function was normal. The estimated ejection fraction was in  the range of 55% to 65%. Wall motion was normal; there were no regional wall motion abnormalities. Doppler parameters are consistent with abnormal left ventricular relaxation (grade 1 diastolic dysfunction). - Mitral valve: Mild regurgitation. - Atrial septum: No defect or patent foramen ovale was identified.  Assessment and Plan:   1. Abnormal Myoview: She had evidence of inferolateral and anteroseptal scar but no ischemia with normal EF and normal wall motion. Echo with normal wall motion. She denies any further chest pain. Continue ASA.   2. Hyperlipidemia: She had abnormal LFTs in the hospital but normal recheck February 2014. Last LDL 79 so statin not started.   3. ESRD: She remains on hemodialysis.  4. Mitral regurgitation: Mild by echo 10/31/12. Will repeat echo in  2 years.

## 2013-03-30 ENCOUNTER — Ambulatory Visit: Payer: Medicare Other | Admitting: Vascular Surgery

## 2013-03-30 ENCOUNTER — Ambulatory Visit (INDEPENDENT_AMBULATORY_CARE_PROVIDER_SITE_OTHER): Payer: Medicare Other | Admitting: Vascular Surgery

## 2013-03-30 ENCOUNTER — Encounter: Payer: Self-pay | Admitting: Vascular Surgery

## 2013-03-30 VITALS — BP 91/64 | HR 93 | Resp 16 | Ht 63.5 in | Wt 190.0 lb

## 2013-03-30 DIAGNOSIS — Z48812 Encounter for surgical aftercare following surgery on the circulatory system: Secondary | ICD-10-CM | POA: Insufficient documentation

## 2013-03-30 DIAGNOSIS — N186 End stage renal disease: Secondary | ICD-10-CM

## 2013-03-30 NOTE — Progress Notes (Signed)
[facility] [place]  Postoperative Access Visit  History of Present Illness  Victoria [name] is a 57 y.o. year old female who presents for postoperative follow-up for: R FA AVG revision (Date: 01/23/13).  The patient's wounds are healed.  The patient notes steal symptoms.  The patient is able to complete their activities of daily living.  The patient's current symptoms are: none.  Good flow rates noted.  No problems with cannulation  For VQI Use Only  PRE-ADM LIVING: Home  AMB STATUS: Ambulatory  Physical Examination Filed Vitals:   03/30/13 1024  BP: 91/64  Pulse: 93  Resp: 16    RE: Incision is healed, skin feels warm, hand grip is 5/5, sensation in digits is intact, palpable thrill, bruit can be auscultated   Medical Decision Making  Victoria [name] is a 57 y.o. year old female who presents s/p R FA AVG revision.  The patient's access is ready for use.  Thank you for allowing [place] to participate in this patient's care.  Adele Barthel, MD [facility] [place] Office: [phone] Pager: [phone]  03/30/2013, 10:43 AM

## 2013-08-23 ENCOUNTER — Encounter (HOSPITAL_COMMUNITY): Payer: Self-pay | Admitting: Emergency Medicine

## 2013-08-23 ENCOUNTER — Emergency Department (HOSPITAL_COMMUNITY): Payer: Medicare Other

## 2013-08-23 ENCOUNTER — Emergency Department (HOSPITAL_COMMUNITY)
Admission: EM | Admit: 2013-08-23 | Discharge: 2013-08-24 | Disposition: A | Discharge status: Home / Self Care | Payer: Medicare Other | Attending: Emergency Medicine | Admitting: Emergency Medicine

## 2013-08-23 DIAGNOSIS — R0982 Postnasal drip: Secondary | ICD-10-CM | POA: Insufficient documentation

## 2013-08-23 DIAGNOSIS — N186 End stage renal disease: Secondary | ICD-10-CM | POA: Insufficient documentation

## 2013-08-23 DIAGNOSIS — Z8709 Personal history of other diseases of the respiratory system: Secondary | ICD-10-CM | POA: Insufficient documentation

## 2013-08-23 DIAGNOSIS — Z992 Dependence on renal dialysis: Secondary | ICD-10-CM | POA: Insufficient documentation

## 2013-08-23 DIAGNOSIS — Z7982 Long term (current) use of aspirin: Secondary | ICD-10-CM | POA: Insufficient documentation

## 2013-08-23 DIAGNOSIS — R0981 Nasal congestion: Secondary | ICD-10-CM

## 2013-08-23 DIAGNOSIS — R51 Headache: Secondary | ICD-10-CM | POA: Insufficient documentation

## 2013-08-23 DIAGNOSIS — Z9109 Other allergy status, other than to drugs and biological substances: Secondary | ICD-10-CM | POA: Insufficient documentation

## 2013-08-23 DIAGNOSIS — J3489 Other specified disorders of nose and nasal sinuses: Secondary | ICD-10-CM | POA: Insufficient documentation

## 2013-08-23 DIAGNOSIS — I120 Hypertensive chronic kidney disease with stage 5 chronic kidney disease or end stage renal disease: Secondary | ICD-10-CM | POA: Insufficient documentation

## 2013-08-23 DIAGNOSIS — Z79899 Other long term (current) drug therapy: Secondary | ICD-10-CM | POA: Insufficient documentation

## 2013-08-23 MED ORDER — HYDROCODONE-ACETAMINOPHEN 5-325 MG PO TABS
2.0000 | ORAL_TABLET | Freq: Once | ORAL | Status: AC
  Administered 2013-08-23: 2 via ORAL
  Filled 2013-08-23: qty 2

## 2013-08-23 NOTE — ED Provider Notes (Signed)
CSN: [id number]     Arrival date & time 08/23/13  2218 History  This chart was scribed for non-physician practitioner Victoria Soho Billiejo Sorto,PA, working with Victoria Jacobsen, MD by Victoria [name], ED scribe. This patient was seen in room TR09C/TR09C and the patient's care was started at 10:53 PM.    Chief Complaint  Patient presents with  . Headache   (Consider location/radiation/quality/duration/timing/severity/associated sxs/prior Treatment) Patient is a 57 y.o. female presenting with headaches. The history is provided by the patient and medical records. No language interpreter was used.  Headache Pain location:  Frontal Quality:  Sharp Radiates to:  Does not radiate Severity currently:  Unable to specify Onset quality:  Sudden Duration:  1 hour Timing:  Constant Progression:  Unchanged Chronicity:  New Similar to prior headaches: no   Relieved by:  Nothing Worsened by:  Nothing tried Ineffective treatments:  Aspirin Associated symptoms: congestion, drainage and sinus pressure   Associated symptoms: no abdominal pain, no back pain, no blurred vision, no cough, no diarrhea, no dizziness, no ear pain, no pain, no fatigue, no fever, no nausea, no neck pain, no neck stiffness, no photophobia, no tingling, no visual change and no vomiting     HPI Comments: Victoria [name] is a 57 y.o. female with a Hx of HTN who presents to the Emergency Department complaining of a gradual but quick onset of a throbbing frontal HA that does not radiate onset 1x hour ago.  Pt reports she does not regularly succumb to headaches and has not hx of migraine headache.  Pt reports 3 days of old symptoms and nasal congestion.  She states her headache is located in the middle of her forehead and made worse by palpating her forehead.  Pt has attempted ASA at home without relief.  Pt denies being on blood thinners, weakness dizziness, visual changes, neck pain, numbness.  Pt denies hx of CVA, blood clots or known  cardiovascular disease.  Patient denies fever, chills, neck pain, chest pain, shortness of breath abdominal pain, nausea, vomiting, diarrhea, weakness, dizziness, numbness, lightheadedness, syncope, changes in vision, diplopia, photophobia, dysuria, hematuria.   Past Medical History  Diagnosis Date  . Seasonal allergies   . H/O cardiovascular stress test     Myoview 08/24/12: Inferolateral and anteroseptal areas of scar, no findings of ischemia, EF 77%.  . Dialysis patient   . Complication of anesthesia ~ 2011    "they gave me a medicine that swolled me and mouth burning up" (08/23/2012)  . Hypertension     Now controlled by HD  . History of bronchitis     questions if she has it now bc throat is scratchy and coughing  . ESRD on dialysis     M/W/F on Jeneen Rinks   Past Surgical History  Procedure Laterality Date  . Parathyroidectomy  2012    With implant to left forearm  . Thyroidectomy, partial  2012    Isthmusectomy   . Total abdominal hysterectomy  2007  . Dilation and curettage of uterus  1970's    "when I was pregnant in my tubes" (08/23/2012)  . Thrombectomy and revision of arterioventous (av) goretex  graft  2012, 2013  . Dialysis fistula creation  1992    "left forearm" (08/23/2012)  . Arteriovenous graft placement  1990's; ~ 2011    "left upper arm; right forearm" (08/23/2012)  . Ankle fracture surgery  ~ 2009    "right" (08/23/2012)  . Revision of arteriovenous goretex graft  09/27/2012  Procedure: REVISION OF ARTERIOVENOUS GORETEX GRAFT;  Surgeon: Mal Misty, MD;  Location: [facility] OR;  Service: Vascular;  Laterality: Right;  1) Replacement of venous half of loop with 33mm Gortex graft  2) Excision of erroded pseudoaneurysm of graft with primary closure.  . Patellectomy  10/03/2012    Procedure: PATELLECTOMY;  Surgeon: Marin Shutter, MD;  Location: [place];  Service: Orthopedics;  Laterality: Right;  RIGHT PARTIAL PATELLECTOMY AND PATELLA TENDON REPAIR  . Patellar tendon  repair  10/03/2012    Procedure: PATELLA TENDON REPAIR;  Surgeon: Marin Shutter, MD;  Location: [place];  Service: Orthopedics;  Laterality: Right;  . Revision of arteriovenous goretex graft Right 01/23/2013    Procedure: REVISION OF ARTERIOVENOUS GORETEX GRAFT;  Surgeon: Conrad Union, MD;  Location: [place];  Service: Vascular;  Laterality: Right;  Using piece of 73mm x 20cm Gortex graft.    Family History  Problem Relation Age of Onset  . Hypertension Father   . Thyroid disease Father   . Hypertension Sister   . Thyroid disease Sister    History  Substance Use Topics  . Smoking status: Never Smoker   . Smokeless tobacco: Never Used  . Alcohol Use: No     Comment: quit 2007   OB History   Grav Para Term Preterm Abortions TAB SAB Ect Mult Living                 Review of Systems  Constitutional: Negative for fever, diaphoresis, appetite change, fatigue and unexpected weight change.  HENT: Positive for congestion, postnasal drip and sinus pressure. Negative for ear pain and mouth sores.   Eyes: Negative for blurred vision, photophobia, pain and visual disturbance.  Respiratory: Negative for cough, chest tightness, shortness of breath and wheezing.   Cardiovascular: Negative for chest pain.  Gastrointestinal: Negative for nausea, vomiting, abdominal pain, diarrhea and constipation.  Endocrine: Negative for polydipsia, polyphagia and polyuria.  Genitourinary: Negative for dysuria, urgency, frequency and hematuria.  Musculoskeletal: Negative for back pain, neck pain and neck stiffness.  Skin: Negative for rash.  Allergic/Immunologic: Negative for immunocompromised state.  Neurological: Positive for headaches. Negative for dizziness, syncope, weakness and light-headedness.  Hematological: Does not bruise/bleed easily.  Psychiatric/Behavioral: Negative for sleep disturbance. The patient is not nervous/anxious.     Allergies  Lisinopril and Norvasc  Home Medications   Current  Outpatient Rx  Name  Route  Sig  Dispense  Refill  . aspirin EC 81 MG tablet   Oral   Take 1 tablet (81 mg total) by mouth daily.         . calcium carbonate (TUMS - DOSED IN MG ELEMENTAL CALCIUM) 500 MG chewable tablet   Oral   Chew 4 tablets by mouth 3 (three) times daily with meals.           BP 131/79  Pulse 105  Temp(Src) 98.4 F (36.9 C) (Oral)  Resp 18  Ht 5' 3.5" (1.613 m)  Wt 201 lb 8 oz (91.4 kg)  BMI 35.13 kg/m2  SpO2 96% Physical Exam  Nursing note and vitals reviewed. Constitutional: She is oriented to person, place, and time. She appears well-developed and well-nourished. No distress.  Awake, alert, nontoxic appearance  HENT:  Head: Normocephalic and atraumatic.  Right Ear: Tympanic membrane, external ear and ear canal normal.  Left Ear: Tympanic membrane, external ear and ear canal normal.  Nose: Mucosal edema present. No rhinorrhea. No epistaxis. Right sinus exhibits frontal sinus tenderness. Right  sinus exhibits no maxillary sinus tenderness. Left sinus exhibits frontal sinus tenderness. Left sinus exhibits no maxillary sinus tenderness.  Mouth/Throat: Uvula is midline, oropharynx is clear and moist and mucous membranes are normal. Mucous membranes are not pale and not cyanotic. No uvula swelling. No oropharyngeal exudate, posterior oropharyngeal edema, posterior oropharyngeal erythema or tonsillar abscesses.  Eyes: Conjunctivae and EOM are normal. Pupils are equal, round, and reactive to light. No scleral icterus.  Neck: Normal range of motion and full passive range of motion without pain. Neck supple. No spinous process tenderness and no muscular tenderness present. No rigidity. Normal range of motion present. No Brudzinski's sign and no Kernig's sign noted.  Full range of motion of the neck without pain No midline or paraspinal tenderness No nuchal rigidity  Cardiovascular: Normal rate, regular rhythm, normal heart sounds and intact distal pulses.   No  murmur heard. Pulmonary/Chest: Effort normal and breath sounds normal. No stridor. No respiratory distress. She has no wheezes. She has no rales.  Abdominal: Soft. Bowel sounds are normal. She exhibits no distension and no mass. There is no tenderness. There is no rebound and no guarding.  Musculoskeletal: Normal range of motion. She exhibits no edema.  Lymphadenopathy:    She has no cervical adenopathy.  Neurological: She is alert and oriented to person, place, and time. She has normal reflexes. No cranial nerve deficit. She exhibits normal muscle tone. Coordination normal.  Speech is clear and goal oriented, follows commands Cranial nerves III - XII without deficit, no facial droop Normal strength in upper and lower extremities bilaterally, strong and equal grip strength Sensation normal to light and sharp touch Moves extremities without ataxia, coordination intact Normal finger to nose and rapid alternating movements Neg romberg, no pronator drift Normal gait Normal heel-shin and balance   Skin: Skin is warm and dry. No rash noted. She is not diaphoretic. No erythema.  Psychiatric: She has a normal mood and affect. Her behavior is normal. Judgment and thought content normal.    ED Course  Procedures (including critical care time) DIAGNOSTIC STUDIES: Oxygen Saturation is 96% on RA, normal by my interpretation.    COORDINATION OF CARE:   10:56 PM- Pt advised of plan for treatment including CT scan of her head and pain medication and pt agrees.  Labs Review Labs Reviewed - No data to display Imaging Review Ct Head Wo Contrast  08/23/2013   CLINICAL DATA:  Headache  EXAM: CT HEAD WITHOUT CONTRAST  TECHNIQUE: Contiguous axial images were obtained from the base of the skull through the vertex without intravenous contrast.  COMPARISON:  None available  FINDINGS: A few scattered hypodense foci seen within the periventricular white matter are present, likely related to chronic  microvascular ischemic changes. There is no acute intracranial hemorrhage or infarct. No mass lesion or midline shift. Gray-white matter differentiation is well maintained. Ventricles are normal in size without evidence of hydrocephalus. CSF containing spaces are within normal limits. No extra-axial fluid collection.  The calvarium is intact.  Orbital soft tissues are within normal limits.  The paranasal sinuses and mastoid air cells are well pneumatized and free of fluid.  Scalp soft tissues are unremarkable.  IMPRESSION: 1. No acute intracranial process. 2. Mild chronic microvascular ischemic changes.   Electronically Signed   By: Jeannine Boga M.D.   On: 08/23/2013 23:43    EKG Interpretation   None       MDM   1. Headache   2. Sinus congestion  Victoria [name] presents with headache, history of sinus congestion and tenderness of the frontal sinuses.  Patient is afebrile, nontoxic, nonseptic appearing. She has a normal neurologic exam without focal deficit.  Patient without nuchal rigidity or meningeal signs.  Will dose by mouth pain medication and reevaluate.  Of concern is patient's report about the speed at which this headache occurred. Will obtain head CT to rule out subarachnoid hemorrhage.  12:10 AM CT head without evidence of subarachnoid hemorrhage or other acute abnormality.  Patient given Vicodin here in the emergency department with resolution of headache. She remains neurovascularly intact and hemodynamically stable. She continues to exhibit no evidence of focal neurologic deficit.  At this point I believe patient's headache is secondary to sinus congestion.  I do not believe that she warrants an LP at this time.  Patient with history of hypertension but she is not hypertensive tonight.  Patient initially tachycardic at triage but no tachycardia on physical exam.  Strict return precautions discussed and patient agrees.  The patient was discussed with Dr. Wilson Singer who agrees  with the treatment plan.  It has been determined that no acute conditions requiring further emergency intervention are present at this time. The patient/guardian have been advised of the diagnosis and plan. We have discussed signs and symptoms that warrant return to the ED, such as changes or worsening in symptoms. Patient/guardian has voiced understanding and agreed to follow-up with the PCP or specialist.       Abigail Butts, PA-C 08/24/13 [id number]

## 2013-08-23 NOTE — ED Notes (Signed)
Returned from radiology.  Pt reports this is the worst headache of her life.

## 2013-08-23 NOTE — ED Notes (Signed)
P states she has had a bad headache for approximately one hour tonight.  Pt is MWF dialysis pt and made her Wednesday appointment.

## 2013-08-29 NOTE — ED Provider Notes (Signed)
Medical screening examination/treatment/procedure(s) were performed by non-physician practitioner and as supervising physician I was immediately available for consultation/collaboration.  EKG Interpretation   None        Virgel Manifold, MD 08/29/13 1339

## 2013-10-04 DIAGNOSIS — E639 Nutritional deficiency, unspecified: Secondary | ICD-10-CM | POA: Insufficient documentation

## 2013-10-20 ENCOUNTER — Emergency Department (HOSPITAL_COMMUNITY)
Admission: EM | Admit: 2013-10-20 | Discharge: 2013-10-20 | Disposition: A | Discharge status: Home / Self Care | Payer: Medicare Other | Attending: Emergency Medicine | Admitting: Emergency Medicine

## 2013-10-20 ENCOUNTER — Encounter (HOSPITAL_COMMUNITY): Payer: Self-pay | Admitting: Emergency Medicine

## 2013-10-20 ENCOUNTER — Emergency Department (HOSPITAL_COMMUNITY): Payer: Medicare Other

## 2013-10-20 DIAGNOSIS — N186 End stage renal disease: Secondary | ICD-10-CM | POA: Insufficient documentation

## 2013-10-20 DIAGNOSIS — R05 Cough: Secondary | ICD-10-CM

## 2013-10-20 DIAGNOSIS — Z7982 Long term (current) use of aspirin: Secondary | ICD-10-CM | POA: Insufficient documentation

## 2013-10-20 DIAGNOSIS — J069 Acute upper respiratory infection, unspecified: Secondary | ICD-10-CM

## 2013-10-20 DIAGNOSIS — Z992 Dependence on renal dialysis: Secondary | ICD-10-CM | POA: Insufficient documentation

## 2013-10-20 DIAGNOSIS — I120 Hypertensive chronic kidney disease with stage 5 chronic kidney disease or end stage renal disease: Secondary | ICD-10-CM | POA: Insufficient documentation

## 2013-10-20 DIAGNOSIS — R059 Cough, unspecified: Secondary | ICD-10-CM

## 2013-10-20 LAB — CBC
HCT: 39.5 % (ref 36.0–46.0)
Hemoglobin: 13.1 g/dL (ref 12.0–15.0)
MCH: 27.5 pg (ref 26.0–34.0)
MCHC: 33.2 g/dL (ref 30.0–36.0)
MCV: 82.8 fL (ref 78.0–100.0)
Platelets: 227 10*3/uL (ref 150–400)
RBC: 4.77 MIL/uL (ref 3.87–5.11)
RDW: 15.4 % (ref 11.5–15.5)
WBC: 11.7 10*3/uL — ABNORMAL HIGH (ref 4.0–10.5)

## 2013-10-20 LAB — BASIC METABOLIC PANEL
BUN: 27 mg/dL — AB (ref 6–23)
CO2: 30 mEq/L (ref 19–32)
CREATININE: 7.82 mg/dL — AB (ref 0.50–1.10)
Calcium: 9.9 mg/dL (ref 8.4–10.5)
Chloride: 100 mEq/L (ref 96–112)
GFR, EST AFRICAN AMERICAN: 6 mL/min — AB (ref >90)
GFR, EST NON AFRICAN AMERICAN: 5 mL/min — AB (ref >90)
Glucose, Bld: 85 mg/dL (ref 70–99)
POTASSIUM: 3.6 meq/L — AB (ref 3.7–5.3)
Sodium: 143 mEq/L (ref 137–147)

## 2013-10-20 NOTE — ED Provider Notes (Signed)
CSN: [id number]     Arrival date & time 10/20/13  [id number] History   First MD Initiated Contact with Patient 10/20/13 1106     Chief Complaint  Patient presents with  . Cough   (Consider location/radiation/quality/duration/timing/severity/associated sxs/prior Treatment) HPI Comments: 58 year old female with ESRD on dialysis presents with cough over the past 5 days. She states his cough productive of brown sputum. She states that she's also been having intermittent headaches (only when coughing), nasal congestion, rhinorrhea, sore throat, chest congestion, and diffuse weakness. She denies any chest pain or shortness of breath. She denies any focal weakness. She's not currently having a headache. She states she was concerned it might be pneumonia. She did get a flu vaccine this year. She denies any orthopnea or leg swelling. She normally gets dialysis on MWF, and last got it yesterday (friday).   Past Medical History  Diagnosis Date  . Seasonal allergies   . H/O cardiovascular stress test     Myoview 08/24/12: Inferolateral and anteroseptal areas of scar, no findings of ischemia, EF 77%.  . Dialysis patient   . Complication of anesthesia ~ 2011    "they gave me a medicine that swolled me and mouth burning up" (08/23/2012)  . Hypertension     Now controlled by HD  . History of bronchitis     questions if she has it now bc throat is scratchy and coughing  . ESRD on dialysis     M/W/F on Jeneen Rinks   Past Surgical History  Procedure Laterality Date  . Parathyroidectomy  2012    With implant to left forearm  . Thyroidectomy, partial  2012    Isthmusectomy   . Total abdominal hysterectomy  2007  . Dilation and curettage of uterus  1970's    "when I was pregnant in my tubes" (08/23/2012)  . Thrombectomy and revision of arterioventous (av) goretex  graft  2012, 2013  . Dialysis fistula creation  1992    "left forearm" (08/23/2012)  . Arteriovenous graft placement  1990's; ~ 2011    "left  upper arm; right forearm" (08/23/2012)  . Ankle fracture surgery  ~ 2009    "right" (08/23/2012)  . Revision of arteriovenous goretex graft  09/27/2012    Procedure: REVISION OF ARTERIOVENOUS GORETEX GRAFT;  Surgeon: Mal Misty, MD;  Location: [facility] OR;  Service: Vascular;  Laterality: Right;  1) Replacement of venous half of loop with 11mm Gortex graft  2) Excision of erroded pseudoaneurysm of graft with primary closure.  . Patellectomy  10/03/2012    Procedure: PATELLECTOMY;  Surgeon: Marin Shutter, MD;  Location: [place];  Service: Orthopedics;  Laterality: Right;  RIGHT PARTIAL PATELLECTOMY AND PATELLA TENDON REPAIR  . Patellar tendon repair  10/03/2012    Procedure: PATELLA TENDON REPAIR;  Surgeon: Marin Shutter, MD;  Location: [place];  Service: Orthopedics;  Laterality: Right;  . Revision of arteriovenous goretex graft Right 01/23/2013    Procedure: REVISION OF ARTERIOVENOUS GORETEX GRAFT;  Surgeon: Conrad [place], MD;  Location: [place];  Service: Vascular;  Laterality: Right;  Using piece of 1mm x 20cm Gortex graft.    Family History  Problem Relation Age of Onset  . Hypertension Father   . Thyroid disease Father   . Hypertension Sister   . Thyroid disease Sister    History  Substance Use Topics  . Smoking status: Never Smoker   . Smokeless tobacco: Never Used  . Alcohol Use: No  Comment: quit 2007   OB History   Grav Para Term Preterm Abortions TAB SAB Ect Mult Living                 Review of Systems  Constitutional: Negative for fever and chills.  HENT: Positive for congestion, rhinorrhea and sore throat.   Eyes: Negative for visual disturbance.  Respiratory: Positive for cough. Negative for shortness of breath.   Cardiovascular: Negative for chest pain and leg swelling.  Gastrointestinal: Negative for vomiting and abdominal pain.  Neurological: Positive for weakness and headaches. Negative for numbness.  All other systems reviewed and are negative.    Allergies   Lisinopril and Norvasc  Home Medications   Current Outpatient Rx  Name  Route  Sig  Dispense  Refill  . aspirin EC 81 MG tablet   Oral   Take 1 tablet (81 mg total) by mouth daily.         . calcium carbonate (TUMS - DOSED IN MG ELEMENTAL CALCIUM) 500 MG chewable tablet   Oral   Chew 4 tablets by mouth 3 (three) times daily as needed for indigestion or heartburn.           BP 137/82  Pulse 95  Temp(Src) 98.4 F (36.9 C) (Oral)  Resp 20  Ht 5\' 3"  (1.6 m)  Wt 180 lb (81.647 kg)  BMI 31.89 kg/m2  SpO2 99% Physical Exam  Nursing note and vitals reviewed. Constitutional: She is oriented to person, place, and time. She appears well-developed and well-nourished. No distress.  HENT:  Head: Normocephalic and atraumatic.  Right Ear: External ear normal.  Left Ear: External ear normal.  Nose: Nose normal.  Mouth/Throat: Oropharynx is clear and moist. No oropharyngeal exudate or posterior oropharyngeal erythema.  Eyes: Right eye exhibits no discharge. Left eye exhibits no discharge.  Cardiovascular: Normal rate, regular rhythm and normal heart sounds.   Pulmonary/Chest: Effort normal and breath sounds normal. She has no wheezes. She has no rales.  Abdominal: Soft. There is no tenderness.  Musculoskeletal: She exhibits no edema.  Neurological: She is alert and oriented to person, place, and time.  Skin: Skin is warm and dry.    ED Course  Procedures (including critical care time) Labs Review Labs Reviewed  BASIC METABOLIC PANEL - Abnormal; Notable for the following:    Potassium 3.6 (*)    BUN 27 (*)    Creatinine, Ser 7.82 (*)    GFR calc non Af Amer 5 (*)    GFR calc Af Amer 6 (*)    All other components within normal limits  CBC - Abnormal; Notable for the following:    WBC 11.7 (*)    All other components within normal limits   Imaging Review Dg Chest 2 View (if Patient Has Fever And/or Copd)  10/20/2013   CLINICAL DATA:  Cough.  Shortness of breath.  Asthma.   Hypertension.  EXAM: CHEST  2 VIEW  COMPARISON:  10/03/2012  FINDINGS: Mild cardiomegaly is stable no evidence of pulmonary edema or other acute infiltrate. No evidence of pleural effusion. Ectasia of thoracic aorta is stable. No mass or lymphadenopathy identified.  IMPRESSION: Stable cardiomegaly.  No active lung disease.   Electronically Signed   By: Earle Gell M.D.   On: 10/20/2013 12:03    EKG Interpretation   None       MDM   1. Cough   2. Upper respiratory infection    Patient with a productive cough but otherwise  normal exam. There are no obvious signs of pneumonia, cleared normal lung exam, no hypoxia, and normal chest x-ray. The patient is not short of breath and is otherwise well appearing. Her labs are benign except mild hypokalemia. No external signs the patient is weak. At this point I believe she has a viral illness. No myalgias. It could be influenza but that has been for 5 days I do not feel treatment would be indicated. At this point discussed symptomatic care and strict return precautions.    Ephraim Hamburger, MD 10/20/13 1321

## 2013-10-20 NOTE — ED Notes (Signed)
Pt c/o congestion and productive cough and headaches for past few days. She has no fever. shes a dialysis pt, she did receive all her dialysis treatments this week

## 2013-10-20 NOTE — Discharge Instructions (Signed)

## 2014-04-11 ENCOUNTER — Encounter: Payer: Medicare Other | Admitting: Cardiovascular Disease

## 2014-04-11 NOTE — Progress Notes (Signed)
No show

## 2014-04-16 ENCOUNTER — Other Ambulatory Visit: Payer: Self-pay

## 2014-04-16 DIAGNOSIS — Z1231 Encounter for screening mammogram for malignant neoplasm of breast: Secondary | ICD-10-CM

## 2014-04-23 ENCOUNTER — Ambulatory Visit
Admission: RE | Admit: 2014-04-23 | Discharge: 2014-04-23 | Disposition: A | Discharge status: Home / Self Care | Payer: Medicare Other | Source: Ambulatory Visit

## 2014-04-23 DIAGNOSIS — Z1231 Encounter for screening mammogram for malignant neoplasm of breast: Secondary | ICD-10-CM

## 2014-06-13 ENCOUNTER — Encounter (HOSPITAL_COMMUNITY): Payer: Self-pay | Admitting: Emergency Medicine

## 2014-06-13 ENCOUNTER — Emergency Department (HOSPITAL_COMMUNITY): Payer: Medicare Other

## 2014-06-13 ENCOUNTER — Emergency Department (HOSPITAL_COMMUNITY)
Admission: EM | Admit: 2014-06-13 | Discharge: 2014-06-13 | Disposition: A | Discharge status: Home / Self Care | Payer: Medicare Other | Attending: Emergency Medicine | Admitting: Emergency Medicine

## 2014-06-13 DIAGNOSIS — N760 Acute vaginitis: Secondary | ICD-10-CM | POA: Insufficient documentation

## 2014-06-13 DIAGNOSIS — R109 Unspecified abdominal pain: Secondary | ICD-10-CM

## 2014-06-13 DIAGNOSIS — Z8709 Personal history of other diseases of the respiratory system: Secondary | ICD-10-CM | POA: Diagnosis not present

## 2014-06-13 DIAGNOSIS — R05 Cough: Secondary | ICD-10-CM | POA: Insufficient documentation

## 2014-06-13 DIAGNOSIS — R059 Cough, unspecified: Secondary | ICD-10-CM | POA: Diagnosis not present

## 2014-06-13 DIAGNOSIS — I120 Hypertensive chronic kidney disease with stage 5 chronic kidney disease or end stage renal disease: Secondary | ICD-10-CM | POA: Diagnosis not present

## 2014-06-13 DIAGNOSIS — A499 Bacterial infection, unspecified: Secondary | ICD-10-CM | POA: Diagnosis not present

## 2014-06-13 DIAGNOSIS — Z7982 Long term (current) use of aspirin: Secondary | ICD-10-CM | POA: Insufficient documentation

## 2014-06-13 DIAGNOSIS — N186 End stage renal disease: Secondary | ICD-10-CM | POA: Insufficient documentation

## 2014-06-13 DIAGNOSIS — B9689 Other specified bacterial agents as the cause of diseases classified elsewhere: Secondary | ICD-10-CM | POA: Diagnosis not present

## 2014-06-13 DIAGNOSIS — Z992 Dependence on renal dialysis: Secondary | ICD-10-CM | POA: Insufficient documentation

## 2014-06-13 LAB — CBC WITH DIFFERENTIAL/PLATELET
Basophils Absolute: 0.1 10*3/uL (ref 0.0–0.1)
Basophils Relative: 1 % (ref 0–1)
Eosinophils Absolute: 0.6 10*3/uL (ref 0.0–0.7)
Eosinophils Relative: 8 % — ABNORMAL HIGH (ref 0–5)
HCT: 37.3 % (ref 36.0–46.0)
Hemoglobin: 12.1 g/dL (ref 12.0–15.0)
LYMPHS ABS: 1.8 10*3/uL (ref 0.7–4.0)
LYMPHS PCT: 26 % (ref 12–46)
MCH: 26.5 pg (ref 26.0–34.0)
MCHC: 32.4 g/dL (ref 30.0–36.0)
MCV: 81.8 fL (ref 78.0–100.0)
Monocytes Absolute: 0.4 10*3/uL (ref 0.1–1.0)
Monocytes Relative: 6 % (ref 3–12)
NEUTROS ABS: 4.1 10*3/uL (ref 1.7–7.7)
NEUTROS PCT: 59 % (ref 43–77)
PLATELETS: 224 10*3/uL (ref 150–400)
RBC: 4.56 MIL/uL (ref 3.87–5.11)
RDW: 14.6 % (ref 11.5–15.5)
WBC: 6.9 10*3/uL (ref 4.0–10.5)

## 2014-06-13 LAB — BASIC METABOLIC PANEL
Anion gap: 11 (ref 5–15)
BUN: 29 mg/dL — ABNORMAL HIGH (ref 6–23)
CO2: 30 meq/L (ref 19–32)
Calcium: 9.5 mg/dL (ref 8.4–10.5)
Chloride: 103 mEq/L (ref 96–112)
Creatinine, Ser: 7.92 mg/dL — ABNORMAL HIGH (ref 0.50–1.10)
GFR calc Af Amer: 6 mL/min — ABNORMAL LOW (ref >90)
GFR, EST NON AFRICAN AMERICAN: 5 mL/min — AB (ref >90)
GLUCOSE: 72 mg/dL (ref 70–99)
POTASSIUM: 3.9 meq/L (ref 3.7–5.3)
SODIUM: 144 meq/L (ref 137–147)

## 2014-06-13 LAB — WET PREP, GENITAL
Trich, Wet Prep: NONE SEEN
WBC, Wet Prep HPF POC: NONE SEEN
Yeast Wet Prep HPF POC: NONE SEEN

## 2014-06-13 MED ORDER — METHOCARBAMOL 500 MG PO TABS: 500.0000 mg | ORAL_TABLET | Freq: Two times a day (BID) | ORAL | Status: DC

## 2014-06-13 MED ORDER — METHOCARBAMOL 500 MG PO TABS
1000.0000 mg | ORAL_TABLET | Freq: Once | ORAL | Status: AC
  Administered 2014-06-13: 1000 mg via ORAL
  Filled 2014-06-13: qty 2

## 2014-06-13 MED ORDER — HYDROCODONE-ACETAMINOPHEN 7.5-325 MG/15ML PO SOLN: 15.0000 mL | Freq: Three times a day (TID) | ORAL | Status: DC | PRN

## 2014-06-13 MED ORDER — METRONIDAZOLE 500 MG PO TABS: 500.0000 mg | ORAL_TABLET | Freq: Two times a day (BID) | ORAL | Status: DC

## 2014-06-13 NOTE — ED Provider Notes (Signed)
CSN: [id number]     Arrival date & time 06/13/14  0712 History   None    Chief Complaint  Patient presents with  . Cough  . Flank Pain  . Generalized Body Aches     (Consider location/radiation/quality/duration/timing/severity/associated sxs/prior Treatment) HPI  Victoria [name] is a 58 y.o. female with past medical history significant for ESRD on hemodialysis (does not make urine) complaining of productive cough (clear sputum) and left flank pain rated 7/10, exacerbated by palpation and laying on her back, no pain medication prior to arrival. Patient also reports abnormal vaginal discharge consistent with prior "yeast infection." Patient is not concern about STDs. Patient denies perineal itching, rash, abdominal pain, nausea vomiting, fever chills, chest pain, shortness of breath. Patient was fully dialyzed yesterday.   Past Medical History  Diagnosis Date  . Seasonal allergies   . H/O cardiovascular stress test     Myoview 08/24/12: Inferolateral and anteroseptal areas of scar, no findings of ischemia, EF 77%.  . Dialysis patient   . Complication of anesthesia ~ 2011    "they gave me a medicine that swolled me and mouth burning up" (08/23/2012)  . Hypertension     Now controlled by HD  . History of bronchitis     questions if she has it now bc throat is scratchy and coughing  . ESRD on dialysis     M/W/F on [facility]   Past Surgical History  Procedure Laterality Date  . Parathyroidectomy  2012    With implant to left forearm  . Thyroidectomy, partial  2012    Isthmusectomy   . Total abdominal hysterectomy  2007  . Dilation and curettage of uterus  1970's    "when I was pregnant in my tubes" (08/23/2012)  . Thrombectomy and revision of arterioventous (av) goretex  graft  2012, 2013  . Dialysis fistula creation  1992    "left forearm" (08/23/2012)  . Arteriovenous graft placement  1990's; ~ 2011    "left upper arm; right forearm" (08/23/2012)  . Ankle fracture surgery  ~  2009    "right" (08/23/2012)  . Revision of arteriovenous goretex graft  09/27/2012    Procedure: REVISION OF ARTERIOVENOUS GORETEX GRAFT;  Surgeon: Mal Misty, MD;  Location: [facility] OR;  Service: Vascular;  Laterality: Right;  1) Replacement of venous half of loop with 51mm Gortex graft  2) Excision of erroded pseudoaneurysm of graft with primary closure.  . Patellectomy  10/03/2012    Procedure: PATELLECTOMY;  Surgeon: Marin Shutter, MD;  Location: [place];  Service: Orthopedics;  Laterality: Right;  RIGHT PARTIAL PATELLECTOMY AND PATELLA TENDON REPAIR  . Patellar tendon repair  10/03/2012    Procedure: PATELLA TENDON REPAIR;  Surgeon: Marin Shutter, MD;  Location: [place];  Service: Orthopedics;  Laterality: Right;  . Revision of arteriovenous goretex graft Right 01/23/2013    Procedure: REVISION OF ARTERIOVENOUS GORETEX GRAFT;  Surgeon: Conrad Arroy[place], MD;  Location: [place];  Service: Vascular;  Laterality: Right;  Using piece of 34mm x 20cm Gortex graft.    Family History  Problem Relation Age of Onset  . Hypertension Father   . Thyroid disease Father   . Hypertension Sister   . Thyroid disease Sister    History  Substance Use Topics  . Smoking status: Never Smoker   . Smokeless tobacco: Never Used  . Alcohol Use: No     Comment: quit 2007   OB History   Grav Para Term  Preterm Abortions TAB SAB Ect Mult Living                 Review of Systems  10 systems reviewed and found to be negative, except as noted in the HPI.   Allergies  Lisinopril and Norvasc  Home Medications   Prior to Admission medications   Medication Sig Start Date End Date Taking? Authorizing Provider  aspirin EC 81 MG tablet Take 1 tablet (81 mg total) by mouth daily. 10/24/12  Yes Liliane Shi, PA-C  calcium carbonate (TUMS - DOSED IN MG ELEMENTAL CALCIUM) 500 MG chewable tablet Chew 4 tablets by mouth 3 (three) times daily as needed for indigestion or heartburn.    Yes Historical Provider, MD    HYDROcodone-acetaminophen (HYCET) 7.5-325 mg/15 ml solution Take 15 mLs by mouth every 8 (eight) hours as needed for moderate pain. 06/13/14   Sheliah Fiorillo, PA-C  methocarbamol (ROBAXIN) 500 MG tablet Take 1 tablet (500 mg total) by mouth 2 (two) times daily. 06/13/14   Robert Sperl, PA-C  metroNIDAZOLE (FLAGYL) 500 MG tablet Take 1 tablet (500 mg total) by mouth 2 (two) times daily. One po bid x 7 days 06/13/14   Elmyra Ricks Malala Trenkamp, PA-C   BP 147/90  Pulse 87  Temp(Src) 97.6 F (36.4 C) (Oral)  Resp 20  Ht 5' 3.5" (1.613 m)  Wt 170 lb (77.111 kg)  BMI 29.64 kg/m2  SpO2 100% Physical Exam  Nursing note and vitals reviewed. Constitutional: She is oriented to person, place, and time. She appears well-developed and well-nourished. No distress.  HENT:  Head: Normocephalic and atraumatic.  Mouth/Throat: Oropharynx is clear and moist.  Eyes: Conjunctivae and EOM are normal. Pupils are equal, round, and reactive to light.  Neck: Normal range of motion.  Cardiovascular: Normal rate, regular rhythm and intact distal pulses.   Fistula to right forearm with excellent thrill  Pulmonary/Chest: Effort normal and breath sounds normal. No stridor. No respiratory distress. She has no wheezes. She has no rales. She exhibits no tenderness.  Abdominal: Soft. Bowel sounds are normal. She exhibits no distension and no mass. There is no tenderness. There is no rebound and no guarding.  Genitourinary:  No focal CVA tenderness palpation bilaterally. Patient is diffusely tender to palpation along the left flank area   Pelvic exam chaperoned by nurse:  No rashes or lesions, there is a thick, homogenous, non-foul-smelling white, opaque vaginal discharge. There is no cervical motion or adnexal tenderness.  Patient was straight cathed during pelvic exam, no urine was obtained.  Musculoskeletal: Normal range of motion. She exhibits no edema and no tenderness.       Back:  Neurological: She is alert and  oriented to person, place, and time.  Psychiatric: She has a normal mood and affect.    ED Course  Procedures (including critical care time) Labs Review Labs Reviewed  WET PREP, GENITAL - Abnormal; Notable for the following:    Clue Cells Wet Prep HPF POC FEW (*)    All other components within normal limits  CBC WITH DIFFERENTIAL - Abnormal; Notable for the following:    Eosinophils Relative 8 (*)    All other components within normal limits  BASIC METABOLIC PANEL - Abnormal; Notable for the following:    BUN 29 (*)    Creatinine, Ser 7.92 (*)    GFR calc non Af Amer 5 (*)    GFR calc Af Amer 6 (*)    All other components within normal limits  GC/CHLAMYDIA PROBE AMP    Imaging Review Dg Chest 2 View  06/13/2014   CLINICAL DATA:  Cough and congestion  EXAM: CHEST  2 VIEW  COMPARISON:  October 20, 2013  FINDINGS: Mild elevation of the left hemidiaphragm is stable. There is mild scarring in the left base. Elsewhere lungs are clear. Heart is mildly enlarged with pulmonary vascularity within normal limits. No adenopathy. There is atherosclerotic change in the aorta. No bone lesions. There are surgical clips in the lower left neck region, stable.  IMPRESSION: No edema or consolidation. Mild elevation left hemidiaphragm with mild scarring left base. No change in cardiac silhouette.   Electronically Signed   By: Lowella Grip M.D.   On: 06/13/2014 08:01   US Renal  06/13/2014   CLINICAL DATA:  Chronic renal failure, dialysis dependent, flank pain.  EXAM: RENAL/URINARY TRACT ULTRASOUND COMPLETE  COMPARISON:  None.  FINDINGS: Right Kidney:  Length: 8.5 cm. There are multiple cysts associated with the left kidney. Knee largest lies in the mid upper pole laterally and measures 3.5 cm in greatest dimension. The cortical echotexture is increased diffusely. There is no hydronephrosis.  Left Kidney:  Length: 10.3 cmthere is a lower pole cyst with maximal dimension of 2.7 cm. There are smaller cysts  elsewhere. The echotexture of the cortex is increased. There is no hydronephrosis.  Bladder:  Not visualized.  IMPRESSION: There are cysts within both kidneys as well as increased cortical echotexture. There is no evidence of obstruction. There is increased cortical echotexture consistent with the patient's medical renal disease.   Electronically Signed   By: David  Martinique   On: 06/13/2014 09:36     EKG Interpretation None      MDM   Final diagnoses:  Cough  Left flank pain  Bacterial vaginosis    Filed Vitals:   06/13/14 0800 06/13/14 0912 06/13/14 0915 06/13/14 0930  BP: 136/94 146/88 133/87 147/90  Pulse:  87 85 87  Temp:      TempSrc:      Resp:  18 18 20   Height:      Weight:      SpO2:  99% 100% 100%    Medications  methocarbamol (ROBAXIN) tablet 1,000 mg (1,000 mg Oral Given 06/13/14 0956)    LAURIE JEPPSEN is a 58 y.o. female presenting with productive cough leg left flank pain, vaginal discharge. Patient does not make urine, no true CVA tenderness to palpation but there is a diffuse tenderness in the left flank area. I think this is a musculoskeletal pain secondary to cough. Vital signs are stable, chest x-ray without infiltrate, no acute abnormality on renal ultrasound. Blood work also reassuring. Pelvic exam is without significant abnormality, there are a few clue cells, we'll treat for BV with Flagyl.   Evaluation does not show pathology that would require ongoing emergent intervention or inpatient treatment. Pt is hemodynamically stable and mentating appropriately. Discussed findings and plan with patient/guardian, who agrees with care plan. All questions answered. Return precautions discussed and outpatient follow up given.   New Prescriptions   HYDROCODONE-ACETAMINOPHEN (HYCET) 7.5-325 MG/15 ML SOLUTION    Take 15 mLs by mouth every 8 (eight) hours as needed for moderate pain.   METHOCARBAMOL (ROBAXIN) 500 MG TABLET    Take 1 tablet (500 mg total) by mouth 2 (two)  times daily.   METRONIDAZOLE (FLAGYL) 500 MG TABLET    Take 1 tablet (500 mg total) by mouth 2 (two) times daily. One po bid x  7 days         Monico Blitz, PA-C 06/13/14 1035

## 2014-06-13 NOTE — ED Notes (Signed)
Patient being transported to [facility] at this time.

## 2014-06-13 NOTE — ED Provider Notes (Signed)
Medical screening examination/treatment/procedure(s) were conducted as a shared visit with non-physician practitioner(s) and myself.  I personally evaluated the patient during the encounter.   EKG Interpretation None      58 year old female with history of ESRD presenting with cough and left flank pain.  On exam, well appearing, nontoxic, not distressed, normal respiratory effort, normal perfusion, lungs clear to auscultation bilaterally, heart sounds normal regular rate and rhythm, abdomen soft and nontender.  Plan labs, chest x-ray, pain control, renal ultrasound.  Clinical Impression: 1. Cough   2. Left flank pain   3. Bacterial vaginosis       Houston Siren III, MD 06/14/14 [phone]

## 2014-06-13 NOTE — ED Notes (Signed)
Patient returned back from xray, and placed back on monitor.

## 2014-06-13 NOTE — Discharge Instructions (Signed)
Take Hycet/Robaxin  for cough and pain. Do not drink alcohol, drive, care for children or do other critical tasks while taking Hycet/Robaxin.   Take your antibiotics as directed and to completion. You should never have any leftover antibiotics! Push fluids and stay well hydrated.   Do not drink alcohol while you are taking flagyl (metronidazole) because it will make you very sick.  Please follow with your primary care doctor in the next 2 days for a check-up. They must obtain records for further management.   Do not hesitate to return to the Emergency Department for any new, worsening or concerning symptoms.    Bacterial Vaginosis Bacterial vaginosis is a vaginal infection that occurs when the normal balance of bacteria in the vagina is disrupted. It results from an overgrowth of certain bacteria. This is the most common vaginal infection in women of childbearing age. Treatment is important to prevent complications, especially in pregnant women, as it can cause a premature delivery. CAUSES  Bacterial vaginosis is caused by an increase in harmful bacteria that are normally present in smaller amounts in the vagina. Several different kinds of bacteria can cause bacterial vaginosis. However, the reason that the condition develops is not fully understood. RISK FACTORS Certain activities or behaviors can put you at an increased risk of developing bacterial vaginosis, including:  Having a new sex partner or multiple sex partners.  Douching.  Using an intrauterine device (IUD) for contraception. Women do not get bacterial vaginosis from toilet seats, bedding, swimming pools, or contact with objects around them. SIGNS AND SYMPTOMS  Some women with bacterial vaginosis have no signs or symptoms. Common symptoms include:  Grey vaginal discharge.  A fishlike odor with discharge, especially after sexual intercourse.  Itching or burning of the vagina and vulva.  Burning or pain with  urination. DIAGNOSIS  Your health care provider will take a medical history and examine the vagina for signs of bacterial vaginosis. A sample of vaginal fluid may be taken. Your health care provider will look at this sample under a microscope to check for bacteria and abnormal cells. A vaginal pH test may also be done.  TREATMENT  Bacterial vaginosis may be treated with antibiotic medicines. These may be given in the form of a pill or a vaginal cream. A second round of antibiotics may be prescribed if the condition comes back after treatment.  HOME CARE INSTRUCTIONS   Only take over-the-counter or prescription medicines as directed by your health care provider.  If antibiotic medicine was prescribed, take it as directed. Make sure you finish it even if you start to feel better.  Do not have sex until treatment is completed.  Tell all sexual partners that you have a vaginal infection. They should see their health care provider and be treated if they have problems, such as a mild rash or itching.  Practice safe sex by using condoms and only having one sex partner. SEEK MEDICAL CARE IF:   Your symptoms are not improving after 3 days of treatment.  You have increased discharge or pain.  You have a fever. MAKE SURE YOU:   Understand these instructions.  Will watch your condition.  Will get help right away if you are not doing well or get worse. FOR MORE INFORMATION  Centers for Disease Control and Prevention, Division of STD Prevention: [employer]raiser[employer]raud[employer] American Sexual Health Association (ASHA): www.ashastd.org  Document Released: 09/06/2005 Document Revised: 06/27/2013 Document Reviewed: 04/18/2013 [facility] Patient Information 2015 [place], [place]. This information is not intended  to replace advice given to you by your health care provider. Make sure you discuss any questions you have with your health care provider.   Cough, Adult  A cough is a reflex that helps clear your throat and  airways. It can help heal the body or may be a reaction to an irritated airway. A cough may only last 2 or 3 weeks (acute) or may last more than 8 weeks (chronic).  CAUSES Acute cough:  Viral or bacterial infections. Chronic cough:  Infections.  Allergies.  Asthma.  Post-nasal drip.  Smoking.  Heartburn or acid reflux.  Some medicines.  Chronic lung problems (COPD).  Cancer. SYMPTOMS   Cough.  Fever.  Chest pain.  Increased breathing rate.  High-pitched whistling sound when breathing (wheezing).  Colored mucus that you cough up (sputum). TREATMENT   A bacterial cough may be treated with antibiotic medicine.  A viral cough must run its course and will not respond to antibiotics.  Your caregiver may recommend other treatments if you have a chronic cough. HOME CARE INSTRUCTIONS   Only take over-the-counter or prescription medicines for pain, discomfort, or fever as directed by your caregiver. Use cough suppressants only as directed by your caregiver.  Use a cold steam vaporizer or humidifier in your bedroom or home to help loosen secretions.  Sleep in a semi-upright position if your cough is worse at night.  Rest as needed.  Stop smoking if you smoke. SEEK IMMEDIATE MEDICAL CARE IF:   You have pus in your sputum.  Your cough starts to worsen.  You cannot control your cough with suppressants and are losing sleep.  You begin coughing up blood.  You have difficulty breathing.  You develop pain which is getting worse or is uncontrolled with medicine.  You have a fever. MAKE SURE YOU:   Understand these instructions.  Will watch your condition.  Will get help right away if you are not doing well or get worse. Document Released: 03/05/2011 Document Revised: 11/29/2011 Document Reviewed: 03/05/2011 [facility] Patient Information 2015 [place], [place]. This information is not intended to replace advice given to you by your health care provider. Make  sure you discuss any questions you have with your health care provider.

## 2014-06-13 NOTE — ED Notes (Signed)
Pt from home with c/o cough, left flank pain, and body aches x 2 weeks.  Pt reports trying heating pads with no relief.  Pt ambulatory with ease to the room from the lobby.  Pt in NAD, A&O.

## 2014-06-14 LAB — GC/CHLAMYDIA PROBE AMP
CT PROBE, AMP APTIMA: NEGATIVE
GC PROBE AMP APTIMA: NEGATIVE

## 2014-06-14 NOTE — ED Provider Notes (Signed)
Medical screening examination/treatment/procedure(s) were conducted as a shared visit with non-physician practitioner(s) and myself.  I personally evaluated the patient during the encounter.   EKG Interpretation None        Houston Siren III, MD 06/14/14 [phone]

## 2014-06-15 ENCOUNTER — Emergency Department (HOSPITAL_COMMUNITY)
Admission: EM | Admit: 2014-06-15 | Discharge: 2014-06-15 | Disposition: A | Discharge status: Home / Self Care | Payer: Medicare Other | Attending: Emergency Medicine | Admitting: Emergency Medicine

## 2014-06-15 ENCOUNTER — Encounter (HOSPITAL_COMMUNITY): Payer: Self-pay | Admitting: Emergency Medicine

## 2014-06-15 DIAGNOSIS — Z8709 Personal history of other diseases of the respiratory system: Secondary | ICD-10-CM | POA: Diagnosis not present

## 2014-06-15 DIAGNOSIS — R609 Edema, unspecified: Secondary | ICD-10-CM | POA: Insufficient documentation

## 2014-06-15 DIAGNOSIS — Z792 Long term (current) use of antibiotics: Secondary | ICD-10-CM | POA: Diagnosis not present

## 2014-06-15 DIAGNOSIS — Z7982 Long term (current) use of aspirin: Secondary | ICD-10-CM | POA: Insufficient documentation

## 2014-06-15 DIAGNOSIS — N186 End stage renal disease: Secondary | ICD-10-CM | POA: Insufficient documentation

## 2014-06-15 DIAGNOSIS — R519 Headache, unspecified: Secondary | ICD-10-CM

## 2014-06-15 DIAGNOSIS — Z992 Dependence on renal dialysis: Secondary | ICD-10-CM | POA: Insufficient documentation

## 2014-06-15 DIAGNOSIS — Z79899 Other long term (current) drug therapy: Secondary | ICD-10-CM | POA: Diagnosis not present

## 2014-06-15 DIAGNOSIS — R52 Pain, unspecified: Secondary | ICD-10-CM | POA: Insufficient documentation

## 2014-06-15 DIAGNOSIS — R51 Headache: Secondary | ICD-10-CM | POA: Diagnosis present

## 2014-06-15 DIAGNOSIS — R05 Cough: Secondary | ICD-10-CM | POA: Insufficient documentation

## 2014-06-15 DIAGNOSIS — I120 Hypertensive chronic kidney disease with stage 5 chronic kidney disease or end stage renal disease: Secondary | ICD-10-CM | POA: Diagnosis not present

## 2014-06-15 DIAGNOSIS — R059 Cough, unspecified: Secondary | ICD-10-CM | POA: Diagnosis not present

## 2014-06-15 MED ORDER — METOCLOPRAMIDE HCL 10 MG PO TABS
10.0000 mg | ORAL_TABLET | Freq: Once | ORAL | Status: AC
  Administered 2014-06-15: 10 mg via ORAL
  Filled 2014-06-15: qty 1

## 2014-06-15 MED ORDER — METOCLOPRAMIDE HCL 10 MG PO TABS: 10.0000 mg | ORAL_TABLET | Freq: Three times a day (TID) | ORAL | Status: DC | PRN

## 2014-06-15 MED ORDER — DIPHENHYDRAMINE HCL 25 MG PO CAPS
50.0000 mg | ORAL_CAPSULE | Freq: Once | ORAL | Status: AC
  Administered 2014-06-15: 50 mg via ORAL
  Filled 2014-06-15: qty 2

## 2014-06-15 MED ORDER — IBUPROFEN 800 MG PO TABS
800.0000 mg | ORAL_TABLET | Freq: Once | ORAL | Status: AC
  Administered 2014-06-15: 800 mg via ORAL
  Filled 2014-06-15: qty 1

## 2014-06-15 NOTE — Discharge Instructions (Signed)
Hypertension Ms. Basden, you were seen today for high blood pressure and headache.  You were given medication to relieve your headache and need to follow up with your regular doctor regarding medication for blood pressure.  Your blood pressure may drop too low after dialysis if we start you on medication from the ED.  Return tot he ED if any of your symptoms worsen, or you develop confusion, chest pain, shortness of breath.  Thank you. Hypertension is another name for high blood pressure. High blood pressure forces your heart to work harder to pump blood. A blood pressure reading has two numbers, which includes a higher number over a lower number (example: 110/72). HOME CARE   Have your blood pressure rechecked by your doctor.  Only take medicine as told by your doctor. Follow the directions carefully. The medicine does not work as well if you skip doses. Skipping doses also puts you at risk for problems.  Do not smoke.  Monitor your blood pressure at home as told by your doctor. GET HELP IF:  You think you are having a reaction to the medicine you are taking.  You have repeat headaches or feel dizzy.  You have puffiness (swelling) in your ankles.  You have trouble with your vision. GET HELP RIGHT AWAY IF:   You get a very bad headache and are confused.  You feel weak, numb, or faint.  You get chest or belly (abdominal) pain.  You throw up (vomit).  You cannot breathe very well. MAKE SURE YOU:   Understand these instructions.  Will watch your condition.  Will get help right away if you are not doing well or get worse. Document Released: 02/23/2008 Document Revised: 09/11/2013 Document Reviewed: 06/29/2013 [facility] Patient Information 2015 [place], [place]. This information is not intended to replace advice given to you by your health care provider. Make sure you discuss any questions you have with your health care provider.  Headaches, Frequently Asked Questions MIGRAINE  HEADACHES Q: What is migraine? What causes it? How can I treat it? A: Generally, migraine headaches begin as a dull ache. Then they develop into a constant, throbbing, and pulsating pain. You may experience pain at the temples. You may experience pain at the front or back of one or both sides of the head. The pain is usually accompanied by a combination of:  Nausea.  Vomiting.  Sensitivity to light and noise. Some people (about 15%) experience an aura (see below) before an attack. The cause of migraine is believed to be chemical reactions in the brain. Treatment for migraine may include over-the-counter or prescription medications. It may also include self-help techniques. These include relaxation training and biofeedback.  Q: What is an aura? A: About 15% of people with migraine get an "aura". This is a sign of neurological symptoms that occur before a migraine headache. You may see wavy or jagged lines, dots, or flashing lights. You might experience tunnel vision or blind spots in one or both eyes. The aura can include visual or auditory hallucinations (something imagined). It may include disruptions in smell (such as strange odors), taste or touch. Other symptoms include:  Numbness.  A "pins and needles" sensation.  Difficulty in recalling or speaking the correct word. These neurological events may last as long as 60 minutes. These symptoms will fade as the headache begins. Q: What is a trigger? A: Certain physical or environmental factors can lead to or "trigger" a migraine. These include:  Foods.  Hormonal changes.  Weather.  Stress. It is important to remember that triggers are different for everyone. To help prevent migraine attacks, you need to figure out which triggers affect you. Keep a headache diary. This is a good way to track triggers. The diary will help you talk to your healthcare professional about your condition. Q: Does weather affect migraines? A: Bright sunshine,  hot, humid conditions, and drastic changes in barometric pressure may lead to, or "trigger," a migraine attack in some people. But studies have shown that weather does not act as a trigger for everyone with migraines. Q: What is the link between migraine and hormones? A: Hormones start and regulate many of your body's functions. Hormones keep your body in balance within a constantly changing environment. The levels of hormones in your body are unbalanced at times. Examples are during menstruation, pregnancy, or menopause. That can lead to a migraine attack. In fact, about three quarters of all women with migraine report that their attacks are related to the menstrual cycle.  Q: Is there an increased risk of stroke for migraine sufferers? A: The likelihood of a migraine attack causing a stroke is very remote. That is not to say that migraine sufferers cannot have a stroke associated with their migraines. In persons under age 12, the most common associated factor for stroke is migraine headache. But over the course of a person's normal life span, the occurrence of migraine headache may actually be associated with a reduced risk of dying from cerebrovascular disease due to stroke.  Q: What are acute medications for migraine? A: Acute medications are used to treat the pain of the headache after it has started. Examples over-the-counter medications, NSAIDs, ergots, and triptans.  Q: What are the triptans? A: Triptans are the newest class of abortive medications. They are specifically targeted to treat migraine. Triptans are vasoconstrictors. They moderate some chemical reactions in the brain. The triptans work on receptors in your brain. Triptans help to restore the balance of a neurotransmitter called serotonin. Fluctuations in levels of serotonin are thought to be a main cause of migraine.  Q: Are over-the-counter medications for migraine effective? A: Over-the-counter, or "OTC," medications may be effective  in relieving mild to moderate pain and associated symptoms of migraine. But you should see your caregiver before beginning any treatment regimen for migraine.  Q: What are preventive medications for migraine? A: Preventive medications for migraine are sometimes referred to as "prophylactic" treatments. They are used to reduce the frequency, severity, and length of migraine attacks. Examples of preventive medications include antiepileptic medications, antidepressants, beta-blockers, calcium channel blockers, and NSAIDs (nonsteroidal anti-inflammatory drugs). Q: Why are anticonvulsants used to treat migraine? A: During the past few years, there has been an increased interest in antiepileptic drugs for the prevention of migraine. They are sometimes referred to as "anticonvulsants". Both epilepsy and migraine may be caused by similar reactions in the brain.  Q: Why are antidepressants used to treat migraine? A: Antidepressants are typically used to treat people with depression. They may reduce migraine frequency by regulating chemical levels, such as serotonin, in the brain.  Q: What alternative therapies are used to treat migraine? A: The term "alternative therapies" is often used to describe treatments considered outside the scope of conventional Western medicine. Examples of alternative therapy include acupuncture, acupressure, and yoga. Another common alternative treatment is herbal therapy. Some herbs are believed to relieve headache pain. Always discuss alternative therapies with your caregiver before proceeding. Some herbal products contain arsenic and other toxins. TENSION HEADACHES  Q: What is a tension-type headache? What causes it? How can I treat it? A: Tension-type headaches occur randomly. They are often the result of temporary stress, anxiety, fatigue, or anger. Symptoms include soreness in your temples, a tightening band-like sensation around your head (a "vice-like" ache). Symptoms can also  include a pulling feeling, pressure sensations, and contracting head and neck muscles. The headache begins in your forehead, temples, or the back of your head and neck. Treatment for tension-type headache may include over-the-counter or prescription medications. Treatment may also include self-help techniques such as relaxation training and biofeedback. CLUSTER HEADACHES Q: What is a cluster headache? What causes it? How can I treat it? A: Cluster headache gets its name because the attacks come in groups. The pain arrives with little, if any, warning. It is usually on one side of the head. A tearing or bloodshot eye and a runny nose on the same side of the headache may also accompany the pain. Cluster headaches are believed to be caused by chemical reactions in the brain. They have been described as the most severe and intense of any headache type. Treatment for cluster headache includes prescription medication and oxygen. SINUS HEADACHES Q: What is a sinus headache? What causes it? How can I treat it? A: When a cavity in the bones of the face and skull (a sinus) becomes inflamed, the inflammation will cause localized pain. This condition is usually the result of an allergic reaction, a tumor, or an infection. If your headache is caused by a sinus blockage, such as an infection, you will probably have a fever. An x-ray will confirm a sinus blockage. Your caregiver's treatment might include antibiotics for the infection, as well as antihistamines or decongestants.  REBOUND HEADACHES Q: What is a rebound headache? What causes it? How can I treat it? A: A pattern of taking acute headache medications too often can lead to a condition known as "rebound headache." A pattern of taking too much headache medication includes taking it more than 2 days per week or in excessive amounts. That means more than the label or a caregiver advises. With rebound headaches, your medications not only stop relieving pain, they  actually begin to cause headaches. Doctors treat rebound headache by tapering the medication that is being overused. Sometimes your caregiver will gradually substitute a different type of treatment or medication. Stopping may be a challenge. Regularly overusing a medication increases the potential for serious side effects. Consult a caregiver if you regularly use headache medications more than 2 days per week or more than the label advises. ADDITIONAL QUESTIONS AND ANSWERS Q: What is biofeedback? A: Biofeedback is a self-help treatment. Biofeedback uses special equipment to monitor your body's involuntary physical responses. Biofeedback monitors:  Breathing.  Pulse.  Heart rate.  Temperature.  Muscle tension.  Brain activity. Biofeedback helps you refine and perfect your relaxation exercises. You learn to control the physical responses that are related to stress. Once the technique has been mastered, you do not need the equipment any more. Q: Are headaches hereditary? A: Four out of five (80%) of people that suffer report a family history of migraine. Scientists are not sure if this is genetic or a family predisposition. Despite the uncertainty, a child has a 50% chance of having migraine if one parent suffers. The child has a 75% chance if both parents suffer.  Q: Can children get headaches? A: By the time they reach high school, most young people have experienced some type of headache.  Many safe and effective approaches or medications can prevent a headache from occurring or stop it after it has begun.  Q: What type of doctor should I see to diagnose and treat my headache? A: Start with your primary caregiver. Discuss his or her experience and approach to headaches. Discuss methods of classification, diagnosis, and treatment. Your caregiver may decide to recommend you to a headache specialist, depending upon your symptoms or other physical conditions. Having diabetes, allergies, etc., may  require a more comprehensive and inclusive approach to your headache. The National Headache Foundation will provide, upon request, a list of [facility] physician members in your state. Document Released: 11/27/2003 Document Revised: 11/29/2011 Document Reviewed: 05/06/2008 [facility] Patient Information 2015 [place], [place]. This information is not intended to replace advice given to you by your health care provider. Make sure you discuss any questions you have with your health care provider.

## 2014-06-15 NOTE — ED Provider Notes (Signed)
CSN: [id number]     Arrival date & time 06/15/14  0455 History   First MD Initiated Contact with Patient 06/15/14 0606     Chief Complaint  Patient presents with  . Cough  . Headache     (Consider location/radiation/quality/duration/timing/severity/associated sxs/prior Treatment) HPI Victoria [name] is a 58 y.o. female with past medical history of end-stage renal disease on hemodialysis Monday Wednesday and Friday, hypertension, coming in with a headache. Patient states she believes because her blood pressure is high. She states her blood pressure is never as high as it is today because it is controlled with hemodialysis. She does not take any blood pressure medications. She states her headache was gradual starting this evening. It is diffuse and throbbing. Nothing has made it better or worse. She states only high blood pressure makes it worse. She states she normally does not get headaches when she has high blood pressure. Her next dialysis sessions tomorrow morning. She denies any neurologic symptoms such as numbness weakness difficulty speaking or eating. She has no chest pain. She states she'll assess shortness of breath. Patient has no further complaints.   10 Systems reviewed and are negative for acute change except as noted in the HPI.     Past Medical History  Diagnosis Date  . Seasonal allergies   . H/O cardiovascular stress test     Myoview 08/24/12: Inferolateral and anteroseptal areas of scar, no findings of ischemia, EF 77%.  . Dialysis patient   . Complication of anesthesia ~ 2011    "they gave me a medicine that swolled me and mouth burning up" (08/23/2012)  . Hypertension     Now controlled by HD  . History of bronchitis     questions if she has it now bc throat is scratchy and coughing  . ESRD on dialysis     M/W/F on J[facility] Rinks   Past Surgical History  Procedure Laterality Date  . Parathyroidectomy  2012    With implant to left forearm  . Thyroidectomy, partial   2012    Isthmusectomy   . Total abdominal hysterectomy  2007  . Dilation and curettage of uterus  1970's    "when I was pregnant in my tubes" (08/23/2012)  . Thrombectomy and revision of arterioventous (av) goretex  graft  2012, 2013  . Dialysis fistula creation  1992    "left forearm" (08/23/2012)  . Arteriovenous graft placement  1990's; ~ 2011    "left upper arm; right forearm" (08/23/2012)  . Ankle fracture surgery  ~ 2009    "right" (08/23/2012)  . Revision of arteriovenous goretex graft  09/27/2012    Procedure: REVISION OF ARTERIOVENOUS GORETEX GRAFT;  Surgeon: Mal Misty, MD;  Location: [facility] OR;  Service: Vascular;  Laterality: Right;  1) Replacement of venous half of loop with 8mm Gortex graft  2) Excision of erroded pseudoaneurysm of graft with primary closure.  . Patellectomy  10/03/2012    Procedure: PATELLECTOMY;  Surgeon: Marin Shutter, MD;  Location: [facility];  Service: Orthopedics;  Laterality: Right;  RIGHT PARTIAL PATELLECTOMY AND PATELLA TENDON REPAIR  . Patellar tendon repair  10/03/2012    Procedure: PATELLA TENDON REPAIR;  Surgeon: Marin Shutter, MD;  Location: [facility];  Service: Orthopedics;  Laterality: Right;  . Revision of arteriovenous goretex graft Right 01/23/2013    Procedure: REVISION OF ARTERIOVENOUS GORETEX GRAFT;  Surgeon: Conrad Bicknell, MD;  Location: [facility];  Service: Vascular;  Laterality: Right;  Using piece  of 29mm x 20cm Gortex graft.    Family History  Problem Relation Age of Onset  . Hypertension Father   . Thyroid disease Father   . Hypertension Sister   . Thyroid disease Sister    History  Substance Use Topics  . Smoking status: Never Smoker   . Smokeless tobacco: Never Used  . Alcohol Use: No     Comment: quit 2007   OB History   Grav Para Term Preterm Abortions TAB SAB Ect Mult Living                 Review of Systems    Allergies  Lisinopril and Norvasc  Home Medications   Prior to Admission medications   Medication Sig Start Date End  Date Taking? Authorizing Provider  aspirin EC 81 MG tablet Take 1 tablet (81 mg total) by mouth daily. 10/24/12  Yes Liliane Shi, PA-C  HYDROcodone-acetaminophen (HYCET) 7.5-325 mg/15 ml solution Take 15 mLs by mouth every 8 (eight) hours as needed for moderate pain. 06/13/14  Yes Nicole Pisciotta, PA-C  methocarbamol (ROBAXIN) 500 MG tablet Take 1 tablet (500 mg total) by mouth 2 (two) times daily. 06/13/14  Yes Nicole Pisciotta, PA-C  metroNIDAZOLE (FLAGYL) 500 MG tablet Take 1 tablet (500 mg total) by mouth 2 (two) times daily. One po bid x 7 days 06/13/14  Yes Nicole Pisciotta, PA-C   There were no vitals taken for this visit. Physical Exam  Nursing note and vitals reviewed. Constitutional: She is oriented to person, place, and time. She appears well-developed and well-nourished. No distress.  HENT:  Head: Normocephalic and atraumatic.  Nose: Nose normal.  Mouth/Throat: Oropharynx is clear and moist. No oropharyngeal exudate.  Eyes: Conjunctivae and EOM are normal. Pupils are equal, round, and reactive to light. No scleral icterus.  Neck: Normal range of motion. Neck supple. No JVD present. No tracheal deviation present. No thyromegaly present.  Cardiovascular: Normal rate, regular rhythm and normal heart sounds.  Exam reveals no gallop and no friction rub.   No murmur heard. Pulmonary/Chest: Effort normal and breath sounds normal. No respiratory distress. She has no wheezes. She exhibits no tenderness.  Abdominal: Soft. Bowel sounds are normal. She exhibits no distension and no mass. There is no tenderness. There is no rebound and no guarding.  Musculoskeletal: Normal range of motion. She exhibits edema. She exhibits no tenderness.  Right forearm AV fistula with palpable thrill.  Lymphadenopathy:    She has no cervical adenopathy.  Neurological: She is alert and oriented to person, place, and time. No cranial nerve deficit. She exhibits normal muscle tone.  Normal strength and sensation  x4 extremities. Normal finger to nose testing.  Skin: Skin is warm and dry. No rash noted. She is not diaphoretic. No erythema. No pallor.    ED Course  Procedures (including critical care time) Labs Review Labs Reviewed - No data to display  Imaging Review Dg Chest 2 View  06/13/2014   CLINICAL DATA:  Cough and congestion  EXAM: CHEST  2 VIEW  COMPARISON:  October 20, 2013  FINDINGS: Mild elevation of the left hemidiaphragm is stable. There is mild scarring in the left base. Elsewhere lungs are clear. Heart is mildly enlarged with pulmonary vascularity within normal limits. No adenopathy. There is atherosclerotic change in the aorta. No bone lesions. There are surgical clips in the lower left neck region, stable.  IMPRESSION: No edema or consolidation. Mild elevation left hemidiaphragm with mild scarring left base. No change  in cardiac silhouette.   Electronically Signed   By: Lowella Grip M.D.   On: 06/13/2014 08:01   US Renal  06/13/2014   CLINICAL DATA:  Chronic renal failure, dialysis dependent, flank pain.  EXAM: RENAL/URINARY TRACT ULTRASOUND COMPLETE  COMPARISON:  None.  FINDINGS: Right Kidney:  Length: 8.5 cm. There are multiple cysts associated with the left kidney. Knee largest lies in the mid upper pole laterally and measures 3.5 cm in greatest dimension. The cortical echotexture is increased diffusely. There is no hydronephrosis.  Left Kidney:  Length: 10.3 cmthere is a lower pole cyst with maximal dimension of 2.7 cm. There are smaller cysts elsewhere. The echotexture of the cortex is increased. There is no hydronephrosis.  Bladder:  Not visualized.  IMPRESSION: There are cysts within both kidneys as well as increased cortical echotexture. There is no evidence of obstruction. There is increased cortical echotexture consistent with the patient's medical renal disease.   Electronically Signed   By: David  Martinique   On: 06/13/2014 09:36     EKG Interpretation None      MDM    Final diagnoses:  None    Patient does emergency department out of concern for headache. I do not believe there is an emergent etiology for this as it as gradual in onset and she believes it is associated with a high blood pressure. She was given Reglan Benadryl and Motrin in the emergency department for treatment.  , Upon Repeat assessment the patient states the headache has resolved. She was found sleeping comfortably in the room. Blood pressure is still elevated at 180s over 100. She currently is not in a hypertensive emergency. There is no confusion, chest pain or shortness of breath. She was advised to followup with primary care physician regarding blood pressure control. She states her blood pressure goes to normal after dialysis, thus putting her on oral tablet might bottom her blood pressure too low. Additionally her blood pressure is too high in between sessions. This is best managed by her primary care physician, and I would not be giving her any antihypertensives. She states she will go to dialysis tomorrow morning. Patient's vital signs remain within her normal limits, she is asymptomatic and stable for discharge.  Everlene Balls, MD 06/15/14 [phone]

## 2014-06-15 NOTE — ED Notes (Signed)
Patient arrives with complaint of headache and cough. States "my head feels like its gonna pop off my body". Endorses shortness of breath after extensive coughing. States was seen here yesterday for side pain and was prescribed medications. Is currently worried that the medications may have made her blood pressure high.

## 2014-06-15 NOTE — ED Notes (Signed)
Patient states her head usually hurts when her BP is elevated.  +photophobia, +nausea

## 2014-06-26 ENCOUNTER — Emergency Department (HOSPITAL_COMMUNITY): Payer: Medicare Other

## 2014-06-26 ENCOUNTER — Emergency Department (HOSPITAL_COMMUNITY)
Admission: EM | Admit: 2014-06-26 | Discharge: 2014-06-26 | Disposition: A | Discharge status: Home / Self Care | Payer: Medicare Other | Attending: Emergency Medicine | Admitting: Emergency Medicine

## 2014-06-26 ENCOUNTER — Encounter (HOSPITAL_COMMUNITY): Payer: Self-pay | Admitting: Emergency Medicine

## 2014-06-26 DIAGNOSIS — I120 Hypertensive chronic kidney disease with stage 5 chronic kidney disease or end stage renal disease: Secondary | ICD-10-CM | POA: Diagnosis not present

## 2014-06-26 DIAGNOSIS — R109 Unspecified abdominal pain: Secondary | ICD-10-CM

## 2014-06-26 DIAGNOSIS — Z7982 Long term (current) use of aspirin: Secondary | ICD-10-CM | POA: Diagnosis not present

## 2014-06-26 DIAGNOSIS — R1110 Vomiting, unspecified: Secondary | ICD-10-CM | POA: Diagnosis not present

## 2014-06-26 DIAGNOSIS — N186 End stage renal disease: Secondary | ICD-10-CM | POA: Insufficient documentation

## 2014-06-26 DIAGNOSIS — N281 Cyst of kidney, acquired: Secondary | ICD-10-CM | POA: Diagnosis not present

## 2014-06-26 DIAGNOSIS — Z992 Dependence on renal dialysis: Secondary | ICD-10-CM | POA: Diagnosis not present

## 2014-06-26 DIAGNOSIS — R05 Cough: Secondary | ICD-10-CM | POA: Diagnosis not present

## 2014-06-26 DIAGNOSIS — Z8709 Personal history of other diseases of the respiratory system: Secondary | ICD-10-CM | POA: Insufficient documentation

## 2014-06-26 DIAGNOSIS — Z79899 Other long term (current) drug therapy: Secondary | ICD-10-CM | POA: Insufficient documentation

## 2014-06-26 LAB — COMPREHENSIVE METABOLIC PANEL
ALK PHOS: 41 U/L (ref 39–117)
ALT: 13 U/L (ref 0–35)
ANION GAP: 13 (ref 5–15)
AST: 24 U/L (ref 0–37)
Albumin: 3.4 g/dL — ABNORMAL LOW (ref 3.5–5.2)
BILIRUBIN TOTAL: 0.5 mg/dL (ref 0.3–1.2)
BUN: 19 mg/dL (ref 6–23)
CHLORIDE: 101 meq/L (ref 96–112)
CO2: 27 meq/L (ref 19–32)
CREATININE: 6.43 mg/dL — AB (ref 0.50–1.10)
Calcium: 10 mg/dL (ref 8.4–10.5)
GFR calc Af Amer: 7 mL/min — ABNORMAL LOW (ref >90)
GFR, EST NON AFRICAN AMERICAN: 6 mL/min — AB (ref >90)
Glucose, Bld: 89 mg/dL (ref 70–99)
Potassium: 3.7 mEq/L (ref 3.7–5.3)
Sodium: 141 mEq/L (ref 137–147)
Total Protein: 7.2 g/dL (ref 6.0–8.3)

## 2014-06-26 LAB — CBC WITH DIFFERENTIAL/PLATELET
Basophils Absolute: 0.1 10*3/uL (ref 0.0–0.1)
Basophils Relative: 1 % (ref 0–1)
Eosinophils Absolute: 0.3 10*3/uL (ref 0.0–0.7)
Eosinophils Relative: 5 % (ref 0–5)
HEMATOCRIT: 35.9 % — AB (ref 36.0–46.0)
HEMOGLOBIN: 12 g/dL (ref 12.0–15.0)
LYMPHS PCT: 19 % (ref 12–46)
Lymphs Abs: 1.4 10*3/uL (ref 0.7–4.0)
MCH: 26.7 pg (ref 26.0–34.0)
MCHC: 33.4 g/dL (ref 30.0–36.0)
MCV: 80 fL (ref 78.0–100.0)
MONOS PCT: 5 % (ref 3–12)
Monocytes Absolute: 0.3 10*3/uL (ref 0.1–1.0)
NEUTROS ABS: 5.1 10*3/uL (ref 1.7–7.7)
Neutrophils Relative %: 70 % (ref 43–77)
Platelets: 254 10*3/uL (ref 150–400)
RBC: 4.49 MIL/uL (ref 3.87–5.11)
RDW: 14.6 % (ref 11.5–15.5)
WBC: 7.2 10*3/uL (ref 4.0–10.5)

## 2014-06-26 LAB — LIPASE, BLOOD: LIPASE: 41 U/L (ref 11–59)

## 2014-06-26 MED ORDER — HYDROCODONE-ACETAMINOPHEN 5-325 MG PO TABS: 2.0000 | ORAL_TABLET | ORAL | Status: DC | PRN

## 2014-06-26 MED ORDER — IOHEXOL 300 MG/ML  SOLN
80.0000 mL | Freq: Once | INTRAMUSCULAR | Status: AC | PRN
  Administered 2014-06-26: 80 mL via INTRAVENOUS

## 2014-06-26 MED ORDER — HYDROMORPHONE HCL 1 MG/ML IJ SOLN
1.0000 mg | Freq: Once | INTRAMUSCULAR | Status: AC
  Administered 2014-06-26: 1 mg via INTRAVENOUS
  Filled 2014-06-26: qty 1

## 2014-06-26 MED ORDER — IOHEXOL 300 MG/ML  SOLN
25.0000 mL | Freq: Once | INTRAMUSCULAR | Status: AC | PRN
  Administered 2014-06-26: 25 mL via ORAL

## 2014-06-26 NOTE — ED Provider Notes (Signed)
Care assumed from Dr. Christy Gentles to followup on CT scan. No acute findings on scan. Multiple cystic lesions and bilateral kidneys. Further characterization recommended for right sided cyst with MRI.  Patient hypertensive. states she takes no blood pressure medications at home as her blood pressure is normal after dialysis. No chest pain, shortness of breath or headache. BP elevated to 180/100 during previous ED visit.   D/w Dr. Marval Regal.  He agrees patient can followup with Dr. Jimmy Footman for MRI.  She does not want it today.    Ezequiel Essex, MD 06/26/14 [phone]

## 2014-06-26 NOTE — ED Provider Notes (Signed)
CSN: [id number]     Arrival date & time 06/26/14  1130 History   First MD Initiated Contact with Patient 06/26/14 1421     Chief Complaint  Patient presents with  . Abdominal Pain     Patient is a 58 y.o. female presenting with flank pain. The history is provided by the patient.  Flank Pain This is a recurrent problem. The current episode started more than 1 week ago. The problem occurs daily. The problem has been gradually worsening. Associated symptoms include abdominal pain. Pertinent negatives include no chest pain. Exacerbated by: movement and palpation. Nothing relieves the symptoms. She has tried rest for the symptoms. The treatment provided no relief.  pt reports ongoing left back/flank and abdominal pain for past 3 weeks She has been seen for this pain recently but unclear etiology No fever She does report recent vomiting No active CP  She has been on dialysis for >20 yrs She was at dialysis today but unable to fully complete due to episodes of pain  She does not make urine  She denies any focal weakness at this time   Past Medical History  Diagnosis Date  . Seasonal allergies   . H/O cardiovascular stress test     Myoview 08/24/12: Inferolateral and anteroseptal areas of scar, no findings of ischemia, EF 77%.  . Dialysis patient   . Complication of anesthesia ~ 2011    "they gave me a medicine that swolled me and mouth burning up" (08/23/2012)  . Hypertension     Now controlled by HD  . History of bronchitis     questions if she has it now bc throat is scratchy and coughing  . ESRD on dialysis     M/W/F on [facility]   Past Surgical History  Procedure Laterality Date  . Parathyroidectomy  2012    With implant to left forearm  . Thyroidectomy, partial  2012    Isthmusectomy   . Total abdominal hysterectomy  2007  . Dilation and curettage of uterus  1970's    "when I was pregnant in my tubes" (08/23/2012)  . Thrombectomy and revision of arterioventous (av)  goretex  graft  2012, 2013  . Dialysis fistula creation  1992    "left forearm" (08/23/2012)  . Arteriovenous graft placement  1990's; ~ 2011    "left upper arm; right forearm" (08/23/2012)  . Ankle fracture surgery  ~ 2009    "right" (08/23/2012)  . Revision of arteriovenous goretex graft  09/27/2012    Procedure: REVISION OF ARTERIOVENOUS GORETEX GRAFT;  Surgeon: Mal Misty, MD;  Location[facility] OR;  Service: Vascular;  Laterality: Right;  1) Replacement of venous half of loop with 42mm Gortex graft  2) Excision of erroded pseudoaneurysm of graft with primary closure.  . Patellectomy  10/03/2012    Procedure: PATELLECTOMY;  Surgeon: Marin Shutter, MD;  Location: [place];  Service: Orthopedics;  Laterality: Right;  RIGHT PARTIAL PATELLECTOMY AND PATELLA TENDON REPAIR  . Patellar tendon repair  10/03/2012    Procedure: PATELLA TENDON REPAIR;  Surgeon: Marin Shutter, MD;  Location: [place];  Service: Orthopedics;  Laterality: Right;  . Revision of arteriovenous goretex graft Right 01/23/2013    Procedure: REVISION OF ARTERIOVENOUS GORETEX GRAFT;  Surgeon: Conrad [place], MD;  Location: [place];  Service: Vascular;  Laterality: Right;  Using piece of 55mm x 20cm Gortex graft.    Family History  Problem Relation Age of Onset  . Hypertension Father   .  Thyroid disease Father   . Hypertension Sister   . Thyroid disease Sister    History  Substance Use Topics  . Smoking status: Never Smoker   . Smokeless tobacco: Never Used  . Alcohol Use: No     Comment: quit 2007   OB History   Grav Para Term Preterm Abortions TAB SAB Ect Mult Living                 Review of Systems  Constitutional: Negative for fever.  Respiratory: Positive for cough.   Cardiovascular: Negative for chest pain.  Gastrointestinal: Positive for vomiting and abdominal pain.  Genitourinary: Positive for flank pain.  Musculoskeletal: Positive for back pain.  Neurological: Negative for weakness.  All other systems reviewed and are  negative.     Allergies  Lisinopril and Norvasc  Home Medications   Prior to Admission medications   Medication Sig Start Date End Date Taking? Authorizing Provider  acetaminophen (TYLENOL) 650 MG CR tablet Take 650 mg by mouth every 8 (eight) hours as needed for pain.   Yes Historical Provider, MD  aspirin EC 81 MG tablet Take 1 tablet (81 mg total) by mouth daily. 10/24/12  Yes Liliane Shi, PA-C  calcium elemental as carbonate (TUMS ULTRA 1000) 400 MG tablet Chew 4,000 mg by mouth 3 (three) times daily with meals.   Yes Historical Provider, MD  HYDROcodone-acetaminophen (HYCET) 7.5-325 mg/15 ml solution Take 15 mLs by mouth every 8 (eight) hours as needed for moderate pain. 06/13/14  Yes Nicole Pisciotta, PA-C  methocarbamol (ROBAXIN) 500 MG tablet Take 1 tablet (500 mg total) by mouth 2 (two) times daily. 06/13/14  Yes Nicole Pisciotta, PA-C  metoCLOPramide (REGLAN) 10 MG tablet Take 1 tablet (10 mg total) by mouth every 8 (eight) hours as needed (headache). 06/15/14  Yes Everlene Balls, MD   BP 168/108  Pulse 101  Temp(Src) 98.6 F (37 C) (Oral)  Resp 22  Wt 170 lb (77.111 kg)  SpO2 98% Physical Exam CONSTITUTIONAL: Well developed/well nourished, uncomfortable appearing HEAD: Normocephalic/atraumatic EYES: EOMI ENMT: Mucous membranes moist NECK: supple no meningeal signs SPINE:entire spine nontender CV: S1/S2 noted, no murmurs/rubs/gallops noted Chest - nontender to palpation LUNGS: Lungs are clear to auscultation bilaterally, no apparent distress ABDOMEN: soft, moderate LUQ tenderness, no rebound or guarding GU:no cva tenderness Diffuse left flank tenderness noted NEURO: Pt is awake/alert, moves all extremitiesx4 EXTREMITIES: pulses normal, full ROM, AV fistula noted to right UE, thrill noted SKIN: warm, color normal, no bruising or rash noted to left flank PSYCH: no abnormalities of mood noted  ED Course  Procedures 2:56 PM Pt with ongoing left flank pain for 3  weeks She has recent evaluation/workup but pain is still present Will perform CT abdomen/pelvis 4:29 PM Pt awaiting CT imaging D/w dr Wyvonnia Dusky - f/u on Ct imaging, if negative can be discharged home  Labs Review Labs Reviewed  CBC WITH DIFFERENTIAL - Abnormal; Notable for the following:    HCT 35.9 (*)    All other components within normal limits  COMPREHENSIVE METABOLIC PANEL - Abnormal; Notable for the following:    Creatinine, Ser 6.43 (*)    Albumin 3.4 (*)    GFR calc non Af Amer 6 (*)    GFR calc Af Amer 7 (*)    All other components within normal limits  LIPASE, BLOOD    Imaging Review Dg Chest 2 View  06/26/2014   CLINICAL DATA:  Cough and posterior left chest wall pain for  2 weeks. Subsequent encounter.  EXAM: CHEST  2 VIEW  COMPARISON:  06/13/2014.  FINDINGS: Stable asymmetric elevation of the left hemidiaphragm. The lungs are clear without focal infiltrate, edema, pneumothorax or pleural effusion. The cardiopericardial silhouette is within normal limits for size. Bones are diffusely demineralized. Surgical clips in the left neck are compatible with prior hemithyroidectomy.  IMPRESSION: Stable.  No acute findings.   Electronically Signed   By: Misty Stanley M.D.   On: 06/26/2014 13:13      MDM   Final diagnoses:  Left flank pain    Nursing notes including past medical history and social history reviewed and considered in documentation xrays reviewed and considered Labs/vital reviewed and considered Previous records reviewed and considered     Sharyon Cable, MD 06/26/14 1630

## 2014-06-26 NOTE — ED Notes (Signed)
CT notified patient finished contrast.

## 2014-06-26 NOTE — ED Notes (Signed)
Pt comfortable with discharge and follow up instructions. Pt declines wheelchair, escorted to waiting area by this RN. Prescriptions x1. 

## 2014-06-26 NOTE — ED Notes (Addendum)
Left sided abd  Flank / back /pain since last week states came laST [place] was giiven meds but it has not helped has been vomiting a bit has had a lot of diarrhea, did not fifnish dialysis this am she felt so bad . Pt states makes NO urine

## 2014-06-26 NOTE — Discharge Instructions (Signed)
Abdominal Pain Follow up with Dr. Jimmy Footman for an MRI of your kidneys. Return to the ED if you develop new or worsening symptoms. Many things can cause abdominal pain. Usually, abdominal pain is not caused by a disease and will improve without treatment. It can often be observed and treated at home. Your health care provider will do a physical exam and possibly order blood tests and X-rays to help determine the seriousness of your pain. However, in many cases, more time must pass before a clear cause of the pain can be found. Before that point, your health care provider may not know if you need more testing or further treatment. HOME CARE INSTRUCTIONS  Monitor your abdominal pain for any changes. The following actions may help to alleviate any discomfort you are experiencing:  Only take over-the-counter or prescription medicines as directed by your health care provider.  Do not take laxatives unless directed to do so by your health care provider.  Try a clear liquid diet (broth, tea, or water) as directed by your health care provider. Slowly move to a bland diet as tolerated. SEEK MEDICAL CARE IF:  You have unexplained abdominal pain.  You have abdominal pain associated with nausea or diarrhea.  You have pain when you urinate or have a bowel movement.  You experience abdominal pain that wakes you in the night.  You have abdominal pain that is worsened or improved by eating food.  You have abdominal pain that is worsened with eating fatty foods.  You have a fever. SEEK IMMEDIATE MEDICAL CARE IF:   Your pain does not go away within 2 hours.  You keep throwing up (vomiting).  Your pain is felt only in portions of the abdomen, such as the right side or the left lower portion of the abdomen.  You pass bloody or black tarry stools. MAKE SURE YOU:  Understand these instructions.   Will watch your condition.   Will get help right away if you are not doing well or get worse.   Document Released: 06/16/2005 Document Revised: 09/11/2013 Document Reviewed: 05/16/2013 [facility] Patient Information 2015 [place], [place]. This information is not intended to replace advice given to you by your health care provider. Make sure you discuss any questions you have with your health care provider.

## 2014-06-28 ENCOUNTER — Encounter (HOSPITAL_COMMUNITY): Payer: Self-pay | Admitting: Emergency Medicine

## 2014-06-28 ENCOUNTER — Emergency Department (HOSPITAL_COMMUNITY): Payer: Medicare Other

## 2014-06-28 ENCOUNTER — Emergency Department (HOSPITAL_COMMUNITY)
Admission: EM | Admit: 2014-06-28 | Discharge: 2014-06-28 | Disposition: A | Discharge status: Home / Self Care | Payer: Medicare Other | Attending: Emergency Medicine | Admitting: Emergency Medicine

## 2014-06-28 DIAGNOSIS — Z992 Dependence on renal dialysis: Secondary | ICD-10-CM | POA: Diagnosis not present

## 2014-06-28 DIAGNOSIS — Z79899 Other long term (current) drug therapy: Secondary | ICD-10-CM | POA: Insufficient documentation

## 2014-06-28 DIAGNOSIS — N281 Cyst of kidney, acquired: Secondary | ICD-10-CM | POA: Diagnosis not present

## 2014-06-28 DIAGNOSIS — Z8709 Personal history of other diseases of the respiratory system: Secondary | ICD-10-CM | POA: Diagnosis not present

## 2014-06-28 DIAGNOSIS — I120 Hypertensive chronic kidney disease with stage 5 chronic kidney disease or end stage renal disease: Secondary | ICD-10-CM | POA: Insufficient documentation

## 2014-06-28 DIAGNOSIS — R109 Unspecified abdominal pain: Secondary | ICD-10-CM | POA: Diagnosis present

## 2014-06-28 DIAGNOSIS — R10A0 Flank pain, unspecified side: Secondary | ICD-10-CM

## 2014-06-28 DIAGNOSIS — Z7982 Long term (current) use of aspirin: Secondary | ICD-10-CM | POA: Insufficient documentation

## 2014-06-28 DIAGNOSIS — N186 End stage renal disease: Secondary | ICD-10-CM | POA: Insufficient documentation

## 2014-06-28 DIAGNOSIS — R000 Tachycardia, unspecified: Secondary | ICD-10-CM | POA: Diagnosis not present

## 2014-06-28 DIAGNOSIS — Q6102 Congenital multiple renal cysts: Secondary | ICD-10-CM

## 2014-06-28 LAB — CBC WITH DIFFERENTIAL/PLATELET
BASOS ABS: 0.1 10*3/uL (ref 0.0–0.1)
Basophils Relative: 1 % (ref 0–1)
EOS ABS: 0.4 10*3/uL (ref 0.0–0.7)
Eosinophils Relative: 6 % — ABNORMAL HIGH (ref 0–5)
HCT: 37.2 % (ref 36.0–46.0)
Hemoglobin: 12.6 g/dL (ref 12.0–15.0)
Lymphocytes Relative: 20 % (ref 12–46)
Lymphs Abs: 1.4 10*3/uL (ref 0.7–4.0)
MCH: 27.2 pg (ref 26.0–34.0)
MCHC: 33.9 g/dL (ref 30.0–36.0)
MCV: 80.2 fL (ref 78.0–100.0)
Monocytes Absolute: 0.4 10*3/uL (ref 0.1–1.0)
Monocytes Relative: 5 % (ref 3–12)
NEUTROS ABS: 4.9 10*3/uL (ref 1.7–7.7)
NEUTROS PCT: 68 % (ref 43–77)
Platelets: 262 10*3/uL (ref 150–400)
RBC: 4.64 MIL/uL (ref 3.87–5.11)
RDW: 14.5 % (ref 11.5–15.5)
WBC: 7.2 10*3/uL (ref 4.0–10.5)

## 2014-06-28 MED ORDER — MORPHINE SULFATE 4 MG/ML IJ SOLN
6.0000 mg | Freq: Once | INTRAMUSCULAR | Status: AC
  Administered 2014-06-28: 6 mg via INTRAVENOUS
  Filled 2014-06-28: qty 2

## 2014-06-28 MED ORDER — ONDANSETRON HCL 4 MG/2ML IJ SOLN
4.0000 mg | Freq: Once | INTRAMUSCULAR | Status: AC
  Administered 2014-06-28: 4 mg via INTRAVENOUS
  Filled 2014-06-28: qty 2

## 2014-06-28 MED ORDER — MORPHINE SULFATE 4 MG/ML IJ SOLN
6.0000 mg | Freq: Once | INTRAMUSCULAR | Status: AC
  Administered 2014-06-28: 6 mg via INTRAVENOUS
  Filled 2014-06-28 (×2): qty 2

## 2014-06-28 NOTE — ED Notes (Signed)
Pt returned from MRI °

## 2014-06-28 NOTE — ED Notes (Signed)
Patient with O2 sat of 89% on room air. Placed on 2 lpm [place] with increased sats to 97%.

## 2014-06-28 NOTE — ED Notes (Signed)
Pt reports has known kidney stone and cysts; reports was here last night, has not passed stone and wants it out

## 2014-06-28 NOTE — Discharge Instructions (Signed)
Please followup with [facility] for further evaluation of your flank pain. Your MRI shows a lesion in your right kidney which may require further evaluation such as a biopsy to rule out cancer. Continue with your dialysis as scheduled.  Continue with your pain medication as needed.  Flank Pain Flank pain is pain in your side. The flank is the area of your side between your upper belly (abdomen) and your back. Pain in this area can be caused by many different things. [place] care and treatment will depend on the cause of your pain.  Rest as told by your doctor.  Drink enough fluids to keep your pee (urine) clear or pale yellow.  Only take medicine as told by your doctor.  Tell your doctor about any changes in your pain.  Follow up with your doctor. GET HELP RIGHT AWAY IF:   Your pain does not get better with medicine.   You have new symptoms or your symptoms get worse.  Your pain gets worse.   You have belly (abdominal) pain.   You are short of breath.   You always feel sick to your stomach (nauseous).   You keep throwing up (vomiting).   You have puffiness (swelling) in your belly.   You feel light-headed or you pass out (faint).   You have blood in your pee.  You have a fever or lasting symptoms for more than 2-3 days.  You have a fever and your symptoms suddenly get worse. MAKE SURE YOU:   Understand these instructions.  Will watch your condition.  Will get help right away if you are not doing well or get worse. Document Released: 06/15/2008 Document Revised: 01/21/2014 Document Reviewed: 04/20/2012 [facility] Patient Information 2015 [place], [place]. This information is not intended to replace advice given to you by your health care provider. Make sure you discuss any questions you have with your health care provider.

## 2014-06-28 NOTE — ED Notes (Signed)
Pt in MRI.

## 2014-06-28 NOTE — ED Provider Notes (Signed)
Patient has end-stage renal disease due to hypertension. She reports pain in her left flank going into her left upper abdomen for the past month. She was seen in the ED 2 days ago and was found to have a large cystic area in her left kidney. Recommendation was for MRI.  The patient is talking on the phone, she states she is in a lot of pain. She indicates her left flank and left upper abdomen as the source of her pain. She is in no respiratory distress.    Dg Chest 2 View  06/26/2014   CLINICAL DATA:  Cough and posterior left chest wall pain for 2 weeks. Subsequent encounter.  EXAM: CHEST  2 VIEW  COMPARISON:  06/13/2014.  FINDINGS: Stable asymmetric elevation of the left hemidiaphragm. The lungs are clear without focal infiltrate, edema, pneumothorax or pleural effusion. The cardiopericardial silhouette is within normal limits for size. Bones are diffusely demineralized. Surgical clips in the left neck are compatible with prior hemithyroidectomy.  IMPRESSION: Stable.  No acute findings.   Electronically Signed   By: Misty Stanley M.D.   On: 06/26/2014 13:13   Ct Abdomen Pelvis W Contrast  06/26/2014   CLINICAL DATA:  Left flank and back pain for over 1 week, worsening.  EXAM: CT ABDOMEN AND PELVIS WITH CONTRAST  TECHNIQUE: Multidetector CT imaging of the abdomen and pelvis was performed using the standard protocol following bolus administration of intravenous contrast.  CONTRAST:  80 mL OMNIPAQUE IOHEXOL 300 MG/ML  SOLN  COMPARISON:  None.  FINDINGS: Mild dependent atelectasis is seen in the lung bases. No pleural or pericardial effusion. There is cardiomegaly.  The kidneys are atrophic with extensive cystic change. There is an exophytic lesion off the lower pole of the right kidney measuring 1.8 cm in diameter which does not have the appearance of a simple cyst. Calcifications are also identified and both kidneys compatible with nonobstructing stones. Benign-appearing retroperitoneal cyst measures 4.3 x  4.2 cm in axial plane. Scattered low attenuating lesions in the liver are identified most consistent with cysts. The gallbladder, adrenal glands, spleen and pancreas appear normal. The patient is status post hysterectomy. A few colonic diverticula are seen without diverticulitis. The colon is otherwise unremarkable. The stomach, small bowel and appendix appear normal. There is no lymphadenopathy or fluid. No focal bony abnormality is seen.  IMPRESSION: No acute finding abdomen or pelvis.  Extensive cystic change in the kidneys with a 1.8 cm lesion off the lower pole the right kidney which cannot be characterized. Recommend MRI for further evaluation.  Benign-appearing retroperitoneal cyst.  Mild diverticulosis without diverticulitis.   Electronically Signed   By: Inge Rise M.D.   On: 06/26/2014 18:42     Medical screening examination/treatment/procedure(s) were conducted as a shared visit with non-physician practitioner(s) and myself.  I personally evaluated the patient during the encounter.   EKG Interpretation None       Rolland Porter, MD, Abram Sander    Janice Norrie, MD 06/28/14 1230

## 2014-06-28 NOTE — ED Provider Notes (Signed)
CSN: [id number]     Arrival date & time 06/28/14  1035 History   First MD Initiated Contact with Patient 06/28/14 1101     Chief Complaint  Patient presents with  . Flank Pain     (Consider location/radiation/quality/duration/timing/severity/associated sxs/prior Treatment) HPI  58 year old female who is a dialysis patient, Monday Wednesday Friday on [employer] with history of hypertension who presents complaining of flank pain. Patient report recurrent sharp achy left flank pain radiates across the back and abdomen ongoing for the past month. Pain has gotten progressively worse, currently 10 out of 10, worsening with movement and with ambulation. Pain minimally improved with Vicodin and Robaxin prescribed. Patient was seen 2 days ago for this complaint an abdominal CT scan performed at that time demonstrated multiple cystic lesion to both kidneys with recommendation for followup MRI. MRI was not emergent. Patient was discharge with pain medication but she is back because her pain is not well controlled. No associated fever, lightheadedness, dizziness, cp, productive cough, sob, abnormal weight changes, night sweats, myalgias, or rash. At this time patient requests to have her kidney cyst removed for pain control. Her primary care is through her nephrologist, Dr. Jimmy Footman.  Pt does not make urine.       Past Medical History  Diagnosis Date  . Seasonal allergies   . H/O cardiovascular stress test     Myoview 08/24/12: Inferolateral and anteroseptal areas of scar, no findings of ischemia, EF 77%.  . Dialysis patient   . Complication of anesthesia ~ 2011    "they gave me a medicine that swolled me and mouth burning up" (08/23/2012)  . Hypertension     Now controlled by HD  . History of bronchitis     questions if she has it now bc throat is scratchy and coughing  . ESRD on dialysis     M/W/F on [facility]inks   Past Surgical History  Procedure Laterality Date  . Parathyroidectomy  2012     With implant to left forearm  . Thyroidectomy, partial  2012    Isthmusectomy   . Total abdominal hysterectomy  2007  . Dilation and curettage of uterus  1970's    "when I was pregnant in my tubes" (08/23/2012)  . Thrombectomy and revision of arterioventous (av) goretex  graft  2012, 2013  . Dialysis fistula creation  1992    "left forearm" (08/23/2012)  . Arteriovenous graft placement  1990's; ~ 2011    "left upper arm; right forearm" (08/23/2012)  . Ankle fracture surgery  ~ 2009    "right" (08/23/2012)  . Revision of arteriovenous goretex graft  09/27/2012    Procedure: REVISION OF ARTERIOVENOUS GORETEX GRAFT;  Surgeon: Mal Misty, MD;  Location: [facility] OR;  Service: Vascular;  Laterality: Right;  1) Replacement of venous half of loop with 45mm Gortex graft  2) Excision of erroded pseudoaneurysm of graft with primary closure.  . Patellectomy  10/03/2012    Procedure: PATELLECTOMY;  Surgeon: Marin Shutter, MD;  Location: [place];  Service: Orthopedics;  Laterality: Right;  RIGHT PARTIAL PATELLECTOMY AND PATELLA TENDON REPAIR  . Patellar tendon repair  10/03/2012    Procedure: PATELLA TENDON REPAIR;  Surgeon: Marin Shutter, MD;  Location: [place];  Service: Orthopedics;  Laterality: Right;  . Revision of arteriovenous goretex graft Right 01/23/2013    Procedure: REVISION OF ARTERIOVENOUS GORETEX GRAFT;  Surgeon: Conrad Ross, MD;  Location: [place];  Service: Vascular;  Laterality: Right;  Using  piece of 39mm x 20cm Gortex graft.    Family History  Problem Relation Age of Onset  . Hypertension Father   . Thyroid disease Father   . Hypertension Sister   . Thyroid disease Sister    History  Substance Use Topics  . Smoking status: Never Smoker   . Smokeless tobacco: Never Used  . Alcohol Use: No     Comment: quit 2007   OB History   Grav Para Term Preterm Abortions TAB SAB Ect Mult Living                 Review of Systems  All other systems reviewed and are negative.     Allergies   Lisinopril and Norvasc  Home Medications   Prior to Admission medications   Medication Sig Start Date End Date Taking? Authorizing Provider  acetaminophen (TYLENOL) 650 MG CR tablet Take 650 mg by mouth every 8 (eight) hours as needed for pain.    Historical Provider, MD  aspirin EC 81 MG tablet Take 1 tablet (81 mg total) by mouth daily. 10/24/12   Liliane Shi, PA-C  calcium elemental as carbonate (TUMS ULTRA 1000) 400 MG tablet Chew 4,000 mg by mouth 3 (three) times daily with meals.    Historical Provider, MD  HYDROcodone-acetaminophen (HYCET) 7.5-325 mg/15 ml solution Take 15 mLs by mouth every 8 (eight) hours as needed for moderate pain. 06/13/14   Nicole Pisciotta, PA-C  HYDROcodone-acetaminophen (NORCO/VICODIN) 5-325 MG per tablet Take 2 tablets by mouth every 4 (four) hours as needed. 06/26/14   Ezequiel Essex, MD  methocarbamol (ROBAXIN) 500 MG tablet Take 1 tablet (500 mg total) by mouth 2 (two) times daily. 06/13/14   Nicole Pisciotta, PA-C  metoCLOPramide (REGLAN) 10 MG tablet Take 1 tablet (10 mg total) by mouth every 8 (eight) hours as needed (headache). 06/15/14   Everlene Balls, MD   BP 174/106  Pulse 104  Temp(Src) 98.2 F (36.8 C) (Oral)  Resp 18  Ht 5' 3.5" (1.613 m)  Wt 170 lb (77.111 kg)  BMI 29.64 kg/m2  SpO2 99% Physical Exam  Nursing note and vitals reviewed. Constitutional: She is oriented to person, place, and time. She appears well-developed and well-nourished. No distress (Patient is tearful, appears to be uncomfortable).  HENT:  Head: Atraumatic.  Eyes: Conjunctivae are normal.  Neck: Neck supple.  Cardiovascular:  Mild tachycardia without murmur rubs or gallop  Pulmonary/Chest: Effort normal and breath sounds normal.  Abdominal:  Abdomen is distended, moderate to be tender throughout most significant to left flank.  Musculoskeletal: She exhibits no edema.  Neurological: She is alert and oriented to person, place, and time.  Skin: No rash noted.  Right  AV fistula, with palpable thrills.  Psychiatric: She has a normal mood and affect.    ED Course  Procedures (including critical care time)  4:01 PM Patient here with progressive worsening of her left flank pain. She has history of renal cyst, and patient is a diabetic. She had a recent CT scan which demonstrated extensive cystic changes of kidney on the right kidney with recommendation of MRI for further evaluation. We did perform an MRI today that demonstrate a lesion to the right kidney on the lower pole. This lesion is incompletely characterized. It may reflect a hemorrhagic cyst or small papillary neoplasm. I discussed this finding with both patient and with Dr. Tomi Bamberger. Plan to have patient follow closely with urologist as patient may benefit from a biopsy to rule out malignancy.  She may need a repeat MRI in 6 months. Patient voiced understanding and agrees with plan. Her pain has subside while in the ER. She will continue with her dialysis as previously scheduled. Patient is stable for discharge. Return precautions discussed.  Labs Review Labs Reviewed  CBC WITH DIFFERENTIAL - Abnormal; Notable for the following:    Eosinophils Relative 6 (*)    All other components within normal limits  I-STAT CHEM 8, ED    Imaging Review Mr Abdomen Wo Contrast  06/28/2014   CLINICAL DATA:  Left upper quadrant and flank pain. Indeterminate renal lesions on CT. No history of malignancy. Chronic renal insufficiency.  EXAM: MRI ABDOMEN WITHOUT CONTRAST  TECHNIQUE: Multiplanar multisequence MR imaging was performed without the administration of intravenous contrast. No contrast was given due to renal insufficiency.  COMPARISON:  Abdominal pelvic CT 06/26/2014. Renal ultrasound 06/13/2014.  FINDINGS: Both kidneys are atrophied with cortical thinning and multiple cystic lesions bilaterally. There is a dominant cystic lesion centrally in the right kidney which measures 7.6 x 4.8 x 6.3 cm. This has dependent debris  and showed septations and thin calcifications on CT. There are no apparent solid components. The lesion of concern anteriorly in the lower pole of the right kidney measures 1.8 cm and shows uniformly low T2 and mildly increased T1 signal. This lesion cannot be fully characterized without contrast. Cystic lesions in the left kidney appear simple, the largest measuring 2.9 cm in the lower pole.  4.4 cm simple posterior mesenteric or retroperitoneal cyst appears stable.  There is no adrenal mass or retroperitoneal adenopathy. No vascular abnormalities are identified on non contrast imaging. The spleen, pancreas and biliary system appear normal.  There is diffusely decreased T2 signal throughout the liver and spleen as well as decreased signal on the in phase images, consistent with iron overload. Numerous hepatic cysts are noted.  IMPRESSION: 1. The lesion of concern in the lower pole of the right kidney demonstrates low T2 and mildly increased T1 signal. Without contrast, this is incompletely characterized. This may reflect a hemorrhagic cyst or small papillary neoplasm. Given the patient's body habitus, this is unlikely to be further characterized with ultrasound. MR follow up in 6 months suggested. 2. There is an additional septated dominant cystic lesion in the right kidney consistent with a Bosniak 2 F lesion. This can be addressed on follow-up as well. 3. There are additional simple cysts in both kidneys and the liver. Both kidneys are atrophied with cortical thinning consistent with chronic renal failure. 4. Iron overload changes within the liver and spleen, likely related to repeated transfusions.   Electronically Signed   By: Camie Patience M.D.   On: 06/28/2014 15:38   Ct Abdomen Pelvis W Contrast  06/26/2014   CLINICAL DATA:  Left flank and back pain for over 1 week, worsening.  EXAM: CT ABDOMEN AND PELVIS WITH CONTRAST  TECHNIQUE: Multidetector CT imaging of the abdomen and pelvis was performed using the  standard protocol following bolus administration of intravenous contrast.  CONTRAST:  80 mL OMNIPAQUE IOHEXOL 300 MG/ML  SOLN  COMPARISON:  None.  FINDINGS: Mild dependent atelectasis is seen in the lung bases. No pleural or pericardial effusion. There is cardiomegaly.  The kidneys are atrophic with extensive cystic change. There is an exophytic lesion off the lower pole of the right kidney measuring 1.8 cm in diameter which does not have the appearance of a simple cyst. Calcifications are also identified and both kidneys compatible with nonobstructing stones. Benign-appearing  retroperitoneal cyst measures 4.3 x 4.2 cm in axial plane. Scattered low attenuating lesions in the liver are identified most consistent with cysts. The gallbladder, adrenal glands, spleen and pancreas appear normal. The patient is status post hysterectomy. A few colonic diverticula are seen without diverticulitis. The colon is otherwise unremarkable. The stomach, small bowel and appendix appear normal. There is no lymphadenopathy or fluid. No focal bony abnormality is seen.  IMPRESSION: No acute finding abdomen or pelvis.  Extensive cystic change in the kidneys with a 1.8 cm lesion off the lower pole the right kidney which cannot be characterized. Recommend MRI for further evaluation.  Benign-appearing retroperitoneal cyst.  Mild diverticulosis without diverticulitis.   Electronically Signed   By: Inge Rise M.D.   On: 06/26/2014 18:42     EKG Interpretation None      MDM   Final diagnoses:  Multiple renal cysts  Flank pain  Dialysis patient    BP 169/106  Pulse 101  Temp(Src) 97.5 F (36.4 C) (Oral)  Resp 15  Ht 5' 3.5" (1.613 m)  Wt 170 lb (77.111 kg)  BMI 29.64 kg/m2  SpO2 96%  I have reviewed nursing notes and vital signs. I personally reviewed the imaging tests through PACS system  I reviewed available ER/hospitalization records thought the EMR     Domenic Moras, [place] 06/28/14 1624

## 2014-06-28 NOTE — ED Notes (Signed)
"  I came yesterday for the same thing. I have a kidney stone. They gave me pain medication, but it's getting worser."

## 2014-06-28 NOTE — ED Provider Notes (Signed)
See prior note   Janice Norrie, MD 06/28/14 [phone]

## 2014-07-03 ENCOUNTER — Encounter (HOSPITAL_COMMUNITY): Payer: Self-pay | Admitting: Emergency Medicine

## 2014-07-03 ENCOUNTER — Emergency Department (HOSPITAL_COMMUNITY)
Admission: EM | Admit: 2014-07-03 | Discharge: 2014-07-04 | Disposition: A | Discharge status: Home / Self Care | Payer: Medicare Other | Attending: Emergency Medicine | Admitting: Emergency Medicine

## 2014-07-03 DIAGNOSIS — I120 Hypertensive chronic kidney disease with stage 5 chronic kidney disease or end stage renal disease: Secondary | ICD-10-CM | POA: Insufficient documentation

## 2014-07-03 DIAGNOSIS — R109 Unspecified abdominal pain: Secondary | ICD-10-CM

## 2014-07-03 DIAGNOSIS — Z992 Dependence on renal dialysis: Secondary | ICD-10-CM | POA: Insufficient documentation

## 2014-07-03 DIAGNOSIS — K5900 Constipation, unspecified: Secondary | ICD-10-CM | POA: Diagnosis present

## 2014-07-03 DIAGNOSIS — Z8709 Personal history of other diseases of the respiratory system: Secondary | ICD-10-CM | POA: Diagnosis not present

## 2014-07-03 DIAGNOSIS — N186 End stage renal disease: Secondary | ICD-10-CM | POA: Diagnosis not present

## 2014-07-03 DIAGNOSIS — Z79899 Other long term (current) drug therapy: Secondary | ICD-10-CM | POA: Insufficient documentation

## 2014-07-03 DIAGNOSIS — D849 Immunodeficiency, unspecified: Secondary | ICD-10-CM | POA: Insufficient documentation

## 2014-07-03 NOTE — ED Notes (Signed)
Pt. reports constipation for 3 days , denies nausea or vomitting , hemodialysis q Mon/Wed/Fri.

## 2014-07-04 ENCOUNTER — Encounter (HOSPITAL_COMMUNITY): Payer: Self-pay | Admitting: Radiology

## 2014-07-04 ENCOUNTER — Emergency Department (HOSPITAL_COMMUNITY): Payer: Medicare Other

## 2014-07-04 LAB — CBC WITH DIFFERENTIAL/PLATELET
Basophils Absolute: 0.1 10*3/uL (ref 0.0–0.1)
Basophils Relative: 1 % (ref 0–1)
EOS ABS: 0.5 10*3/uL (ref 0.0–0.7)
Eosinophils Relative: 6 % — ABNORMAL HIGH (ref 0–5)
HEMATOCRIT: 34.8 % — AB (ref 36.0–46.0)
Hemoglobin: 11.5 g/dL — ABNORMAL LOW (ref 12.0–15.0)
LYMPHS PCT: 24 % (ref 12–46)
Lymphs Abs: 1.7 10*3/uL (ref 0.7–4.0)
MCH: 26.7 pg (ref 26.0–34.0)
MCHC: 33 g/dL (ref 30.0–36.0)
MCV: 80.7 fL (ref 78.0–100.0)
MONO ABS: 0.4 10*3/uL (ref 0.1–1.0)
Monocytes Relative: 6 % (ref 3–12)
Neutro Abs: 4.5 10*3/uL (ref 1.7–7.7)
Neutrophils Relative %: 63 % (ref 43–77)
Platelets: 238 10*3/uL (ref 150–400)
RBC: 4.31 MIL/uL (ref 3.87–5.11)
RDW: 14.7 % (ref 11.5–15.5)
WBC: 7.2 10*3/uL (ref 4.0–10.5)

## 2014-07-04 LAB — COMPREHENSIVE METABOLIC PANEL
ALK PHOS: 38 U/L — AB (ref 39–117)
ALT: 13 U/L (ref 0–35)
AST: 23 U/L (ref 0–37)
Albumin: 3.3 g/dL — ABNORMAL LOW (ref 3.5–5.2)
Anion gap: 11 (ref 5–15)
BILIRUBIN TOTAL: 0.4 mg/dL (ref 0.3–1.2)
BUN: 14 mg/dL (ref 6–23)
CO2: 29 meq/L (ref 19–32)
Calcium: 10.1 mg/dL (ref 8.4–10.5)
Chloride: 99 mEq/L (ref 96–112)
Creatinine, Ser: 6 mg/dL — ABNORMAL HIGH (ref 0.50–1.10)
GFR calc non Af Amer: 7 mL/min — ABNORMAL LOW (ref >90)
GFR, EST AFRICAN AMERICAN: 8 mL/min — AB (ref >90)
GLUCOSE: 89 mg/dL (ref 70–99)
POTASSIUM: 3.7 meq/L (ref 3.7–5.3)
Sodium: 139 mEq/L (ref 137–147)
Total Protein: 6.8 g/dL (ref 6.0–8.3)

## 2014-07-04 LAB — LIPASE, BLOOD: Lipase: 34 U/L (ref 11–59)

## 2014-07-04 MED ORDER — IOHEXOL 300 MG/ML  SOLN
25.0000 mL | Freq: Once | INTRAMUSCULAR | Status: AC | PRN
  Administered 2014-07-04: 25 mL via ORAL

## 2014-07-04 MED ORDER — ONDANSETRON HCL 4 MG/2ML IJ SOLN
4.0000 mg | Freq: Once | INTRAMUSCULAR | Status: AC
  Administered 2014-07-04: 4 mg via INTRAVENOUS
  Filled 2014-07-04: qty 2

## 2014-07-04 MED ORDER — SODIUM CHLORIDE 0.9 % IV SOLN
INTRAVENOUS | Status: DC
  Administered 2014-07-04: 100 mL/h via INTRAVENOUS

## 2014-07-04 MED ORDER — HYDROMORPHONE HCL 1 MG/ML IJ SOLN
1.0000 mg | Freq: Once | INTRAMUSCULAR | Status: AC
  Administered 2014-07-04: 1 mg via INTRAVENOUS
  Filled 2014-07-04: qty 1

## 2014-07-04 MED ORDER — IOHEXOL 300 MG/ML  SOLN
80.0000 mL | Freq: Once | INTRAMUSCULAR | Status: AC | PRN
  Administered 2014-07-04: 80 mL via INTRAVENOUS

## 2014-07-04 NOTE — ED Provider Notes (Signed)
CSN: [id number]     Arrival date & time 07/03/14  2325 History   First MD Initiated Contact with Patient 07/03/14 2347     Chief Complaint  Patient presents with  . Constipation     (Consider location/radiation/quality/duration/timing/severity/associated sxs/prior Treatment) Patient is a 58 y.o. female presenting with constipation. The history is provided by the patient.  Constipation Associated symptoms: abdominal pain   Associated symptoms: no back pain, no diarrhea, no fever, no nausea and no vomiting    patient with complaint of constipation for 3 days. Denies nausea vomiting. Abdominal pain anteriorly. Patient's hemodialysis patient has been for 23 years. Gets dialysis Monday Wednesdays and Friday was dialyzed earlier today.  Past Medical History  Diagnosis Date  . Seasonal allergies   . H/O cardiovascular stress test     Myoview 08/24/12: Inferolateral and anteroseptal areas of scar, no findings of ischemia, EF 77%.  . Dialysis patient   . Complication of anesthesia ~ 2011    "they gave me a medicine that swolled me and mouth burning up" (08/23/2012)  . Hypertension     Now controlled by HD  . History of bronchitis     questions if she has it now bc throat is scratchy and coughing  . ESRD on dialysis     M/W/F on [facility]inks   Past Surgical History  Procedure Laterality Date  . Parathyroidectomy  2012    With implant to left forearm  . Thyroidectomy, partial  2012    Isthmusectomy   . Total abdominal hysterectomy  2007  . Dilation and curettage of uterus  1970's    "when I was pregnant in my tubes" (08/23/2012)  . Thrombectomy and revision of arterioventous (av) goretex  graft  2012, 2013  . Dialysis fistula creation  1992    "left forearm" (08/23/2012)  . Arteriovenous graft placement  1990's; ~ 2011    "left upper arm; right forearm" (08/23/2012)  . Ankle fracture surgery  ~ 2009    "right" (08/23/2012)  . Revision of arteriovenous goretex graft  09/27/2012   Procedure: REVISION OF ARTERIOVENOUS GORETEX GRAFT;  Surgeon: Mal Misty, MD;  Location: [facility] OR;  Service: Vascular;  Laterality: Right;  1) Replacement of venous half of loop with 66mm Gortex graft  2) Excision of erroded pseudoaneurysm of graft with primary closure.  . Patellectomy  10/03/2012    Procedure: PATELLECTOMY;  Surgeon: Marin Shutter, MD;  Location: [place];  Service: Orthopedics;  Laterality: Right;  RIGHT PARTIAL PATELLECTOMY AND PATELLA TENDON REPAIR  . Patellar tendon repair  10/03/2012    Procedure: PATELLA TENDON REPAIR;  Surgeon: Marin Shutter, MD;  Location: [place];  Service: Orthopedics;  Laterality: Right;  . Revision of arteriovenous goretex graft Right 01/23/2013    Procedure: REVISION OF ARTERIOVENOUS GORETEX GRAFT;  Surgeon: Conrad Woodlawn, MD;  Location: [place];  Service: Vascular;  Laterality: Right;  Using piece of 28mm x 20cm Gortex graft.    Family History  Problem Relation Age of Onset  . Hypertension Father   . Thyroid disease Father   . Hypertension Sister   . Thyroid disease Sister    History  Substance Use Topics  . Smoking status: Never Smoker   . Smokeless tobacco: Never Used  . Alcohol Use: No     Comment: quit 2007   OB History   Grav Para Term Preterm Abortions TAB SAB Ect Mult Living  Review of Systems  Constitutional: Negative for fever.  HENT: Negative for congestion.   Eyes: Negative for visual disturbance.  Respiratory: Negative for shortness of breath.   Cardiovascular: Negative for chest pain.  Gastrointestinal: Positive for abdominal pain and constipation. Negative for nausea, vomiting, diarrhea and abdominal distention.  Musculoskeletal: Negative for back pain.  Skin: Negative for rash.  Allergic/Immunologic: Positive for immunocompromised state.  Neurological: Negative for headaches.  Hematological: Does not bruise/bleed easily.  Psychiatric/Behavioral: Negative for confusion.      Allergies  Lisinopril and  Norvasc  Home Medications   Prior to Admission medications   Medication Sig Start Date End Date Taking? Authorizing Provider  acetaminophen (TYLENOL) 650 MG CR tablet Take 650 mg by mouth every 8 (eight) hours as needed for pain.   Yes Historical Provider, MD  calcium elemental as carbonate (TUMS ULTRA 1000) 400 MG tablet Chew 4,000 mg by mouth 3 (three) times daily with meals.   Yes Historical Provider, MD  Docusate Calcium (STOOL SOFTENER PO) Take 2 tablets by mouth once.   Yes Historical Provider, MD  HYDROcodone-acetaminophen (NORCO/VICODIN) 5-325 MG per tablet Take 2 tablets by mouth every 4 (four) hours as needed for moderate pain.   Yes Historical Provider, MD   BP 171/110  Pulse 91  Temp(Src) 97.6 F (36.4 C) (Oral)  Resp 18  Ht 5\' 3"  (1.6 m)  Wt 170 lb (77.111 kg)  BMI 30.12 kg/m2  SpO2 96% Physical Exam  Nursing note and vitals reviewed. Constitutional: She is oriented to person, place, and time. She appears well-developed and well-nourished. No distress.  HENT:  Head: Normocephalic and atraumatic.  Mouth/Throat: Oropharynx is clear and moist.  Eyes: Conjunctivae and EOM are normal. Pupils are equal, round, and reactive to light.  Neck: Normal range of motion.  Cardiovascular: Normal rate, regular rhythm and normal heart sounds.   No murmur heard. Pulmonary/Chest: Effort normal and breath sounds normal. No respiratory distress.  Abdominal: Soft. Bowel sounds are normal. She exhibits no distension and no mass. There is no tenderness.  Genitourinary:  Rectal exam perianal without fissure or external hemorrhoids are prolapsed internal hemorrhoids. No mass no stool in the vault.  Musculoskeletal: Normal range of motion.  AV fistula in the right arm with a good radial pulse good pulse of the fistula good thrill.  Neurological: She is alert and oriented to person, place, and time. No cranial nerve deficit. She exhibits normal muscle tone. Coordination normal.  Skin: Skin is  warm. No rash noted.    ED Course  Procedures (including critical care time) Labs Review Labs Reviewed  COMPREHENSIVE METABOLIC PANEL - Abnormal; Notable for the following:    Creatinine, Ser 6.00 (*)    Albumin 3.3 (*)    Alkaline Phosphatase 38 (*)    GFR calc non Af Amer 7 (*)    GFR calc Af Amer 8 (*)    All other components within normal limits  CBC WITH DIFFERENTIAL - Abnormal; Notable for the following:    Hemoglobin 11.5 (*)    HCT 34.8 (*)    Eosinophils Relative 6 (*)    All other components within normal limits  LIPASE, BLOOD   Results for orders placed during the hospital encounter of 07/03/14  COMPREHENSIVE METABOLIC PANEL      Result Value Ref Range   Sodium 139  137 - 147 mEq/L   Potassium 3.7  3.7 - 5.3 mEq/L   Chloride 99  96 - 112 mEq/L   CO2 29  19 - 32 mEq/L   Glucose, Bld 89  70 - 99 mg/dL   BUN 14  6 - 23 mg/dL   Creatinine, Ser 6.00 (*) 0.50 - 1.10 mg/dL   Calcium 10.1  8.4 - 10.5 mg/dL   Total Protein 6.8  6.0 - 8.3 g/dL   Albumin 3.3 (*) 3.5 - 5.2 g/dL   AST 23  0 - 37 U/L   ALT 13  0 - 35 U/L   Alkaline Phosphatase 38 (*) 39 - 117 U/L   Total Bilirubin 0.4  0.3 - 1.2 mg/dL   GFR calc non Af Amer 7 (*) >90 mL/min   GFR calc Af Amer 8 (*) >90 mL/min   Anion gap 11  5 - 15  LIPASE, BLOOD      Result Value Ref Range   Lipase 34  11 - 59 U/L  CBC WITH DIFFERENTIAL      Result Value Ref Range   WBC 7.2  4.0 - 10.5 K/uL   RBC 4.31  3.87 - 5.11 MIL/uL   Hemoglobin 11.5 (*) 12.0 - 15.0 g/dL   HCT 34.8 (*) 36.0 - 46.0 %   MCV 80.7  78.0 - 100.0 fL   MCH 26.7  26.0 - 34.0 pg   MCHC 33.0  30.0 - 36.0 g/dL   RDW 14.7  11.5 - 15.5 %   Platelets 238  150 - 400 K/uL   Neutrophils Relative % 63  43 - 77 %   Neutro Abs 4.5  1.7 - 7.7 K/uL   Lymphocytes Relative 24  12 - 46 %   Lymphs Abs 1.7  0.7 - 4.0 K/uL   Monocytes Relative 6  3 - 12 %   Monocytes Absolute 0.4  0.1 - 1.0 K/uL   Eosinophils Relative 6 (*) 0 - 5 %   Eosinophils Absolute 0.5  0.0  - 0.7 K/uL   Basophils Relative 1  0 - 1 %   Basophils Absolute 0.1  0.0 - 0.1 K/uL     Imaging Review Ct Abdomen Pelvis W Contrast  07/04/2014   CLINICAL DATA:  Constipation.  Initial encounter.  EXAM: CT ABDOMEN AND PELVIS WITH CONTRAST  TECHNIQUE: Multidetector CT imaging of the abdomen and pelvis was performed using the standard protocol following bolus administration of intravenous contrast.  CONTRAST:  80mL OMNIPAQUE IOHEXOL 300 MG/ML  SOLN  COMPARISON:  06/26/2014  FINDINGS: BODY WALL: Unremarkable.  LOWER CHEST: There is moderate cardiomegaly. No pericardial effusion. No evidence of pneumonia or pulmonary edema.  ABDOMEN/PELVIS:  Liver: Numerous cysts within the liver. No acute or significant findings.  Biliary: No evidence of biliary obstruction or stone.  Pancreas: Unremarkable.  Spleen: Unremarkable.  Adrenals: Unremarkable.  Kidneys and ureters: Atrophic polycystic kidneys which have been recently characterized by MRI, including a high density or enhancing mass in the right lower pole measuring 15 mm. No hydronephrosis.  Bladder: Completely decompressed.  Reproductive: Hysterectomy.  No adnexal mass.  Bowel: There is no significant stool retention to confirm history of constipation. There is scattered barium within the colon, but not typical of barium impaction. Normal appendix.  Retroperitoneum: No mass or adenopathy.  Peritoneum: Stable retroperitoneal or mesenteric cyst at the level of the infrarenal cava, 4.4 cm in diameter.  Vascular: No acute abnormality.  OSSEOUS: No acute abnormalities.  IMPRESSION: No findings to explain abdominal pain. No change compared to recent CT and MR imaging of the abdomen.   Electronically Signed   By: Jorje Guild  M.D.   On: 07/04/2014 04:03     EKG Interpretation None      MDM   Final diagnoses:  Abdominal pain, unspecified abdominal location    Patient with complaint of constipation for 3 days no nausea no vomiting stated the crampy  abdominal pain. Patient undergoes hemodialysis Monday Wednesdays and Fridays. Was dialyzed earlier today. Patient's rectal exam was negative for any stool in the vault. A CT scan was done. That's also negative for any evidence of constipation or any acute findings of the abdomen. Patient we discharged home and continue her pain medicine that she's already on which is hydrocodone. Labs without any significant abnormalities.    Fredia Sorrow, MD 07/04/14 [phone]

## 2014-07-04 NOTE — Discharge Instructions (Signed)
No evidence of constipation. CT scan negative. Lab workup without significant findings. Continuing her dialysis schedule as planned. Take pain medicine as needed.

## 2014-07-04 NOTE — ED Notes (Signed)
Contacted CT to notify that pt is done with contrast.

## 2014-12-31 ENCOUNTER — Emergency Department (HOSPITAL_COMMUNITY): Payer: Medicare Other

## 2014-12-31 ENCOUNTER — Encounter (HOSPITAL_COMMUNITY): Payer: Self-pay | Admitting: Emergency Medicine

## 2014-12-31 ENCOUNTER — Emergency Department (HOSPITAL_COMMUNITY)
Admission: EM | Admit: 2014-12-31 | Discharge: 2014-12-31 | Disposition: A | Discharge status: Home / Self Care | Payer: Medicare Other | Attending: Emergency Medicine | Admitting: Emergency Medicine

## 2014-12-31 DIAGNOSIS — R1084 Generalized abdominal pain: Secondary | ICD-10-CM | POA: Diagnosis not present

## 2014-12-31 DIAGNOSIS — N186 End stage renal disease: Secondary | ICD-10-CM | POA: Diagnosis not present

## 2014-12-31 DIAGNOSIS — I120 Hypertensive chronic kidney disease with stage 5 chronic kidney disease or end stage renal disease: Secondary | ICD-10-CM | POA: Insufficient documentation

## 2014-12-31 DIAGNOSIS — R197 Diarrhea, unspecified: Secondary | ICD-10-CM | POA: Diagnosis not present

## 2014-12-31 DIAGNOSIS — Z79899 Other long term (current) drug therapy: Secondary | ICD-10-CM | POA: Insufficient documentation

## 2014-12-31 DIAGNOSIS — Z992 Dependence on renal dialysis: Secondary | ICD-10-CM | POA: Diagnosis not present

## 2014-12-31 DIAGNOSIS — Z8709 Personal history of other diseases of the respiratory system: Secondary | ICD-10-CM | POA: Insufficient documentation

## 2014-12-31 DIAGNOSIS — R109 Unspecified abdominal pain: Secondary | ICD-10-CM

## 2014-12-31 DIAGNOSIS — M545 Low back pain: Secondary | ICD-10-CM | POA: Insufficient documentation

## 2014-12-31 DIAGNOSIS — M5489 Other dorsalgia: Secondary | ICD-10-CM

## 2014-12-31 LAB — COMPREHENSIVE METABOLIC PANEL
ALBUMIN: 3.1 g/dL — AB (ref 3.5–5.2)
ALK PHOS: 50 U/L (ref 39–117)
ALT: 16 U/L (ref 0–35)
AST: 23 U/L (ref 0–37)
Anion gap: 13 (ref 5–15)
BILIRUBIN TOTAL: 0.8 mg/dL (ref 0.3–1.2)
BUN: 43 mg/dL — ABNORMAL HIGH (ref 6–23)
CO2: 27 mmol/L (ref 19–32)
Calcium: 8.7 mg/dL (ref 8.4–10.5)
Chloride: 100 mmol/L (ref 96–112)
Creatinine, Ser: 10.74 mg/dL — ABNORMAL HIGH (ref 0.50–1.10)
GFR calc Af Amer: 4 mL/min — ABNORMAL LOW (ref >90)
GFR calc non Af Amer: 3 mL/min — ABNORMAL LOW (ref >90)
Glucose, Bld: 94 mg/dL (ref 70–99)
POTASSIUM: 5.7 mmol/L — AB (ref 3.5–5.1)
SODIUM: 140 mmol/L (ref 135–145)
Total Protein: 6.5 g/dL (ref 6.0–8.3)

## 2014-12-31 LAB — CBC WITH DIFFERENTIAL/PLATELET
BASOS ABS: 0.1 10*3/uL (ref 0.0–0.1)
BASOS ABS: 0.1 10*3/uL (ref 0.0–0.1)
BASOS PCT: 1 % (ref 0–1)
BASOS PCT: 1 % (ref 0–1)
EOS ABS: 0.8 10*3/uL — AB (ref 0.0–0.7)
EOS ABS: 0.8 10*3/uL — AB (ref 0.0–0.7)
Eosinophils Relative: 8 % — ABNORMAL HIGH (ref 0–5)
Eosinophils Relative: 7 % — ABNORMAL HIGH (ref 0–5)
HCT: 40.4 % (ref 36.0–46.0)
HEMATOCRIT: 42.2 % (ref 36.0–46.0)
HEMOGLOBIN: 13.8 g/dL (ref 12.0–15.0)
Hemoglobin: 13.7 g/dL (ref 12.0–15.0)
LYMPHS PCT: 36 % (ref 12–46)
Lymphocytes Relative: 29 % (ref 12–46)
Lymphs Abs: 3 10*3/uL (ref 0.7–4.0)
Lymphs Abs: 4.2 10*3/uL — ABNORMAL HIGH (ref 0.7–4.0)
MCH: 28.2 pg (ref 26.0–34.0)
MCH: 27.6 pg (ref 26.0–34.0)
MCHC: 34.2 g/dL (ref 30.0–36.0)
MCHC: 32.5 g/dL (ref 30.0–36.0)
MCV: 82.6 fL (ref 78.0–100.0)
MCV: 84.9 fL (ref 78.0–100.0)
MONO ABS: 0.8 10*3/uL (ref 0.1–1.0)
MONO ABS: 0.7 10*3/uL (ref 0.1–1.0)
Monocytes Relative: 7 % (ref 3–12)
Monocytes Relative: 6 % (ref 3–12)
NEUTROS PCT: 55 % (ref 43–77)
Neutro Abs: 5.7 10*3/uL (ref 1.7–7.7)
Neutro Abs: 6.1 10*3/uL (ref 1.7–7.7)
Neutrophils Relative %: 51 % (ref 43–77)
Platelets: 316 10*3/uL (ref 150–400)
Platelets: 190 10*3/uL (ref 150–400)
RBC: 4.89 MIL/uL (ref 3.87–5.11)
RBC: 4.97 MIL/uL (ref 3.87–5.11)
RDW: 16.7 % — AB (ref 11.5–15.5)
RDW: 15.7 % — AB (ref 11.5–15.5)
WBC: 10.4 10*3/uL (ref 4.0–10.5)
WBC: 11.8 10*3/uL — ABNORMAL HIGH (ref 4.0–10.5)

## 2014-12-31 MED ORDER — HYDROMORPHONE HCL 1 MG/ML IJ SOLN
1.0000 mg | Freq: Once | INTRAMUSCULAR | Status: AC
  Administered 2014-12-31: 1 mg via INTRAVENOUS
  Filled 2014-12-31: qty 1

## 2014-12-31 MED ORDER — IOHEXOL 300 MG/ML  SOLN
25.0000 mL | INTRAMUSCULAR | Status: AC
  Administered 2014-12-31: 25 mL via ORAL

## 2014-12-31 MED ORDER — IOHEXOL 350 MG/ML SOLN
80.0000 mL | Freq: Once | INTRAVENOUS | Status: AC | PRN
  Administered 2014-12-31: 80 mL via INTRAVENOUS

## 2014-12-31 MED ORDER — HYDROCODONE-ACETAMINOPHEN 5-325 MG PO TABS
2.0000 | ORAL_TABLET | Freq: Once | ORAL | Status: AC
  Administered 2014-12-31: 2 via ORAL
  Filled 2014-12-31: qty 2

## 2014-12-31 MED ORDER — IOHEXOL 300 MG/ML  SOLN: 25.0000 mL | INTRAMUSCULAR | Status: DC

## 2014-12-31 MED ORDER — HYDROCODONE-ACETAMINOPHEN 5-325 MG PO TABS: 2.0000 | ORAL_TABLET | ORAL | Status: DC | PRN

## 2014-12-31 NOTE — ED Notes (Signed)
Pt has returned from ct

## 2014-12-31 NOTE — ED Notes (Signed)
NAD at this time. Pt is stable and going home.  

## 2014-12-31 NOTE — ED Notes (Signed)
Pt c/o lower back pain and diarrhea with some right sided pain with movement; pt sts some cough and congestion as well

## 2014-12-31 NOTE — Discharge Instructions (Signed)
Back Pain, Adult °Low back pain is very common. About 1 in 5 people have back pain. The cause of low back pain is rarely dangerous. The pain often gets better over time. About half of people with a sudden onset of back pain feel better in just 2 weeks. About 8 in 10 people feel better by 6 weeks.  °CAUSES °Some common causes of back pain include: °· Strain of the muscles or ligaments supporting the spine. °· Wear and tear (degeneration) of the spinal discs. °· Arthritis. °· Direct injury to the back. °DIAGNOSIS °Most of the time, the direct cause of low back pain is not known. However, back pain can be treated effectively even when the exact cause of the pain is unknown. Answering your caregiver's questions about your overall health and symptoms is one of the most accurate ways to make sure the cause of your pain is not dangerous. If your caregiver needs more information, he or she may order lab work or imaging tests (X-rays or MRIs). However, even if imaging tests show changes in your back, this usually does not require surgery. °HOME CARE INSTRUCTIONS °For many people, back pain returns. Since low back pain is rarely dangerous, it is often a condition that people can learn to manage on their own.  °· Remain active. It is stressful on the back to sit or stand in one place. Do not sit, drive, or stand in one place for more than 30 minutes at a time. Take short walks on level surfaces as soon as pain allows. Try to increase the length of time you walk each day. °· Do not stay in bed. Resting more than 1 or 2 days can delay your recovery. °· Do not avoid exercise or work. Your body is made to move. It is not dangerous to be active, even though your back may hurt. Your back will likely heal faster if you return to being active before your pain is gone. °· Pay attention to your body when you  bend and lift. Many people have less discomfort when lifting if they bend their knees, keep the load close to their bodies, and  avoid twisting. Often, the most comfortable positions are those that put less stress on your recovering back. °· Find a comfortable position to sleep. Use a firm mattress and lie on your side with your knees slightly bent. If you lie on your back, put a pillow under your knees. °· Only take over-the-counter or prescription medicines as directed by your caregiver. Over-the-counter medicines to reduce pain and inflammation are often the most helpful. Your caregiver may prescribe muscle relaxant drugs. These medicines help dull your pain so you can more quickly return to your normal activities and healthy exercise. °· Put ice on the injured area. °¨ Put ice in a plastic bag. °¨ Place a towel between your skin and the bag. °¨ Leave the ice on for 15-20 minutes, 03-04 times a day for the first 2 to 3 days. After that, ice and heat may be alternated to reduce pain and spasms. °· Ask your caregiver about trying back exercises and gentle massage. This may be of some benefit. °· Avoid feeling anxious or stressed. Stress increases muscle tension and can worsen back pain. It is important to recognize when you are anxious or stressed and learn ways to manage it. Exercise is a great option. °SEEK MEDICAL CARE IF: °· You have pain that is not relieved with rest or medicine. °· You have pain that does not improve in 1 week. °· You have new symptoms. °· You are generally not feeling well. °SEEK   IMMEDIATE MEDICAL CARE IF:   You have pain that radiates from your back into your legs.  You develop new bowel or bladder control problems.  You have unusual weakness or numbness in your arms or legs.  You develop nausea or vomiting.  You develop abdominal pain.  You feel faint. Document Released: 09/06/2005 Document Revised: 03/07/2012 Document Reviewed: 01/08/2014 [facility] Patient Information 2015 [place], [place]. This information is not intended to replace advice given to you by your health care provider. Make sure you  discuss any questions you have with your health care provider. Diarrhea Diarrhea is frequent loose and watery bowel movements. It can cause you to feel weak and dehydrated. Dehydration can cause you to become tired and thirsty, have a dry mouth, and have decreased urination that often is dark yellow. Diarrhea is a sign of another problem, most often an infection that will not last long. In most cases, diarrhea typically lasts 2-3 days. However, it can last longer if it is a sign of something more serious. It is important to treat your diarrhea as directed by your caregiver to lessen or prevent future episodes of diarrhea. CAUSES  Some common causes include:  Gastrointestinal infections caused by viruses, bacteria, or parasites.  Food poisoning or food allergies.  Certain medicines, such as antibiotics, chemotherapy, and laxatives.  Artificial sweeteners and fructose.  Digestive disorders. HOME CARE INSTRUCTIONS  Ensure adequate fluid intake (hydration): Have 1 cup (8 oz) of fluid for each diarrhea episode. Avoid fluids that contain simple sugars or sports drinks, fruit juices, whole milk products, and sodas. Your urine should be clear or pale yellow if you are drinking enough fluids. Hydrate with an oral rehydration solution that you can purchase at pharmacies, retail stores, and online. You can prepare an oral rehydration solution at home by mixing the following ingredients together:   - tsp table salt.   tsp baking soda.   tsp salt substitute containing potassium chloride.  1  tablespoons sugar.  1 L (34 oz) of water.  Certain foods and beverages may increase the speed at which food moves through the gastrointestinal (GI) tract. These foods and beverages should be avoided and include:  Caffeinated and alcoholic beverages.  High-fiber foods, such as raw fruits and vegetables, nuts, seeds, and whole grain breads and cereals.  Foods and beverages sweetened with sugar alcohols, such  as xylitol, sorbitol, and mannitol.  Some foods may be well tolerated and may help thicken stool including:  Starchy foods, such as rice, toast, pasta, low-sugar cereal, oatmeal, grits, baked potatoes, crackers, and bagels.  Bananas.  Applesauce.  Add probiotic-rich foods to help increase healthy bacteria in the GI tract, such as yogurt and fermented milk products.  Wash your hands well after each diarrhea episode.  Only take over-the-counter or prescription medicines as directed by your caregiver.  Take a warm bath to relieve any burning or pain from frequent diarrhea episodes. SEEK IMMEDIATE MEDICAL CARE IF:   You are unable to keep fluids down.  You have persistent vomiting.  You have blood in your stool, or your stools are black and tarry.  You do not urinate in 6-8 hours, or there is only a small amount of very dark urine.  You have abdominal pain that increases or localizes.  You have weakness, dizziness, confusion, or light-headedness.  You have a severe headache.  Your diarrhea gets worse or does not get better.  You have a fever or persistent symptoms for more than 2-3 days.  You  have a fever and your symptoms suddenly get worse. MAKE SURE YOU:   Understand these instructions.  Will watch your condition.  Will get help right away if you are not doing well or get worse. Document Released: 08/27/2002 Document Revised: 01/21/2014 Document Reviewed: 05/14/2012 [facility] Patient Information 2015 [place], [place]. This information is not intended to replace advice given to you by your health care provider. Make sure you discuss any questions you have with your health care provider.

## 2014-12-31 NOTE — ED Provider Notes (Signed)
CSN: [id number]     Arrival date & time 12/31/14  [id number] History   First MD Initiated Contact with Patient 12/31/14 1106     Chief Complaint  Patient presents with  . Back Pain  . Diarrhea     (Consider location/radiation/quality/duration/timing/severity/associated sxs/prior Treatment) Patient is a 59 y.o. female presenting with abdominal pain. The history is provided by the patient. No language interpreter was used.  Abdominal Pain Pain location:  Generalized Pain quality: aching   Pain radiates to:  Does not radiate Pain severity:  Moderate Onset quality:  Gradual Duration:  1 day Timing:  Constant Progression:  Worsening Chronicity:  New Relieved by:  Nothing Worsened by:  Nothing tried Ineffective treatments:  None tried Associated symptoms: diarrhea   Associated symptoms: no vomiting   Risk factors: no recent hospitalization   Pt is on dialysis.  Pt went to dialysis yesterday  Past Medical History  Diagnosis Date  . Seasonal allergies   . H/O cardiovascular stress test     Myoview 08/24/12: Inferolateral and anteroseptal areas of scar, no findings of ischemia, EF 77%.  . Dialysis patient   . Complication of anesthesia ~ 2011    "they gave me a medicine that swolled me and mouth burning up" (08/23/2012)  . Hypertension     Now controlled by HD  . History of bronchitis     questions if she has it now bc throat is scratchy and coughing  . ESRD on dialysis     M/W/F on Jeneen Rinks   Past Surgical History  Procedure Laterality Date  . Parathyroidectomy  2012    With implant to left forearm  . Thyroidectomy, partial  2012    Isthmusectomy   . Total abdominal hysterectomy  2007  . Dilation and curettage of uterus  1970's    "when I was pregnant in my tubes" (08/23/2012)  . Thrombectomy and revision of arterioventous (av) goretex  graft  2012, 2013  . Dialysis fistula creation  1992    "left forearm" (08/23/2012)  . Arteriovenous graft placement  1990's; ~ 2011     "left upper arm; right forearm" (08/23/2012)  . Ankle fracture surgery  ~ 2009    "right" (08/23/2012)  . Revision of arteriovenous goretex graft  09/27/2012    Procedure: REVISION OF ARTERIOVENOUS GORETEX GRAFT;  Surgeon: Mal Misty, MD;  Location: [facility] OR;  Service: Vascular;  Laterality: Right;  1) Replacement of venous half of loop with 69mm Gortex graft  2) Excision of erroded pseudoaneurysm of graft with primary closure.  . Patellectomy  10/03/2012    Procedure: PATELLECTOMY;  Surgeon: Marin Shutter, MD;  Location: [place];  Service: Orthopedics;  Laterality: Right;  RIGHT PARTIAL PATELLECTOMY AND PATELLA TENDON REPAIR  . Patellar tendon repair  10/03/2012    Procedure: PATELLA TENDON REPAIR;  Surgeon: Marin Shutter, MD;  Location: [place];  Service: Orthopedics;  Laterality: Right;  . Revision of arteriovenous goretex graft Right 01/23/2013    Procedure: REVISION OF ARTERIOVENOUS GORETEX GRAFT;  Surgeon: Conrad Hillsboro, MD;  Location: [place];  Service: Vascular;  Laterality: Right;  Using piece of 64mm x 20cm Gortex graft.    Family History  Problem Relation Age of Onset  . Hypertension Father   . Thyroid disease Father   . Hypertension Sister   . Thyroid disease Sister    History  Substance Use Topics  . Smoking status: Never Smoker   . Smokeless tobacco: Never Used  .  Alcohol Use: No     Comment: quit 2007   OB History    No data available     Review of Systems  Gastrointestinal: Positive for abdominal pain and diarrhea. Negative for vomiting.  All other systems reviewed and are negative.     Allergies  Lisinopril and Norvasc  Home Medications   Prior to Admission medications   Medication Sig Start Date End Date Taking? Authorizing Provider  acetaminophen (TYLENOL) 650 MG CR tablet Take 650 mg by mouth every 8 (eight) hours as needed for pain.    Historical Provider, MD  calcium elemental as carbonate (TUMS ULTRA 1000) 400 MG tablet Chew 4,000 mg by mouth 3 (three) times  daily with meals.    Historical Provider, MD  Docusate Calcium (STOOL SOFTENER PO) Take 2 tablets by mouth once.    Historical Provider, MD  HYDROcodone-acetaminophen (NORCO/VICODIN) 5-325 MG per tablet Take 2 tablets by mouth every 4 (four) hours as needed for moderate pain.    Historical Provider, MD   BP 126/67 mmHg  Pulse 88  Temp(Src) 97.4 F (36.3 C) (Oral)  Resp 18  SpO2 97% Physical Exam  Constitutional: She is oriented to person, place, and time. She appears well-developed and well-nourished.  HENT:  Head: Normocephalic and atraumatic.  Right Ear: External ear normal.  Left Ear: External ear normal.  Nose: Nose normal.  Mouth/Throat: Oropharynx is clear and moist.  Eyes: Conjunctivae and EOM are normal. Pupils are equal, round, and reactive to light.  Neck: Normal range of motion.  Cardiovascular: Normal rate and normal heart sounds.   Pulmonary/Chest: Effort normal.  Abdominal: Soft. She exhibits no distension. There is tenderness.  Musculoskeletal: Normal range of motion.  Tender low back   Neurological: She is alert and oriented to person, place, and time.  Skin: Skin is warm.  Psychiatric: She has a normal mood and affect.  Nursing note and vitals reviewed.   ED Course  Procedures (including critical care time) Labs Review Labs Reviewed  CBC WITH DIFFERENTIAL/PLATELET - Abnormal; Notable for the following:    RDW 16.7 (*)    Eosinophils Relative 8 (*)    Eosinophils Absolute 0.8 (*)    All other components within normal limits  CBC WITH DIFFERENTIAL/PLATELET - Abnormal; Notable for the following:    WBC 11.8 (*)    RDW 15.7 (*)    Lymphs Abs 4.2 (*)    Eosinophils Relative 7 (*)    Eosinophils Absolute 0.8 (*)    All other components within normal limits  COMPREHENSIVE METABOLIC PANEL - Abnormal; Notable for the following:    Potassium 5.7 (*)    BUN 43 (*)    Creatinine, Ser 10.74 (*)    Albumin 3.1 (*)    GFR calc non Af Amer 3 (*)    GFR calc Af  Amer 4 (*)    All other components within normal limits    Imaging Review Ct Abdomen Pelvis W Contrast  12/31/2014   CLINICAL DATA:  Low back pain and right-sided abdominal pain with diarrhea. Cough and chest congestion.  EXAM: CT ABDOMEN AND PELVIS WITH CONTRAST  TECHNIQUE: Multidetector CT imaging of the abdomen and pelvis was performed using the standard protocol following bolus administration of intravenous contrast.  CONTRAST:  16mL OMNIPAQUE IOHEXOL 350 MG/ML SOLN  COMPARISON:  CT scan dated 07/04/2014  FINDINGS: The liver, biliary tree, spleen, pancreas, and adrenal glands are normal except for multiple stable benign cysts in the liver as well as  1 calcified granuloma in the liver.  There is severe bilateral renal atrophy with multiple small cysts in both kidneys. The large cyst in the upper pole the right kidney seen on the prior study is no longer present. There is a hyperdense lesion in the lower pole of the right kidney measuring 19 mm in diameter, stable.  Cyst anterior to the distal inferior vena cava is smaller than on the prior study, now measuring 3.4 cm AP dimension.  There is scattered diverticula in the colon. The bowel otherwise appears normal including the terminal ileum and appendix. Uterus is been removed. Ovaries are normal. Bladder is empty. No free air or free fluid or adenopathy. Arterial vascular calcifications are scattered throughout the abdomen and pelvis. No significant osseous abnormality. Lung bases are clear. Slight chronic cardiomegaly.  IMPRESSION: No acute abnormalities.   Electronically Signed   By: Lorriane Shire M.D.   On: 12/31/2014 15:39     EKG Interpretation None      MDM  Lab reports blood hemolysed.  Pt will need redraw.  Pt given zofran and dilaudid for pain   Final diagnoses:  Abdominal pain  Midline back pain, unspecified location    rx for hydrocodone See your Physician for recheck    Fransico Meadow, PA-C 12/31/14 [place],  MD 01/01/15 [phone]

## 2015-03-17 ENCOUNTER — Emergency Department (HOSPITAL_COMMUNITY)
Admission: EM | Admit: 2015-03-17 | Discharge: 2015-03-17 | Disposition: A | Discharge status: Home / Self Care | Payer: Medicare Other | Attending: Emergency Medicine | Admitting: Emergency Medicine

## 2015-03-17 ENCOUNTER — Encounter (HOSPITAL_COMMUNITY): Payer: Self-pay | Admitting: Emergency Medicine

## 2015-03-17 ENCOUNTER — Emergency Department (HOSPITAL_COMMUNITY): Payer: Medicare Other

## 2015-03-17 DIAGNOSIS — Z8669 Personal history of other diseases of the nervous system and sense organs: Secondary | ICD-10-CM

## 2015-03-17 DIAGNOSIS — Z79899 Other long term (current) drug therapy: Secondary | ICD-10-CM | POA: Insufficient documentation

## 2015-03-17 DIAGNOSIS — N186 End stage renal disease: Secondary | ICD-10-CM | POA: Diagnosis not present

## 2015-03-17 DIAGNOSIS — I120 Hypertensive chronic kidney disease with stage 5 chronic kidney disease or end stage renal disease: Secondary | ICD-10-CM | POA: Diagnosis not present

## 2015-03-17 DIAGNOSIS — Z992 Dependence on renal dialysis: Secondary | ICD-10-CM | POA: Diagnosis not present

## 2015-03-17 DIAGNOSIS — R51 Headache: Secondary | ICD-10-CM | POA: Insufficient documentation

## 2015-03-17 MED ORDER — LORATADINE 10 MG PO TABS
10.0000 mg | ORAL_TABLET | Freq: Once | ORAL | Status: AC
  Administered 2015-03-17: 10 mg via ORAL
  Filled 2015-03-17: qty 1

## 2015-03-17 MED ORDER — KETOROLAC TROMETHAMINE 30 MG/ML IJ SOLN
30.0000 mg | Freq: Once | INTRAMUSCULAR | Status: AC
  Administered 2015-03-17: 30 mg via INTRAVENOUS
  Filled 2015-03-17: qty 1

## 2015-03-17 MED ORDER — METOCLOPRAMIDE HCL 5 MG/ML IJ SOLN
10.0000 mg | Freq: Once | INTRAMUSCULAR | Status: AC
  Administered 2015-03-17: 10 mg via INTRAVENOUS
  Filled 2015-03-17: qty 2

## 2015-03-17 NOTE — ED Notes (Signed)
C/o L frontal headache since around 9pm tonight.  Denies light sensitivity or nausea/vomiting.  Also c/o insect bite- 1 to left side and 1 to right side of face.

## 2015-03-17 NOTE — ED Provider Notes (Signed)
CSN: [id number]     Arrival date & time 03/17/15  0155 History  This chart was scribed for Tova Vater, MD by Randa Evens, ED Scribe. This patient was seen in room D33C/D33C and the patient's care was started at 2:12 AM.      Chief Complaint  Patient presents with  . Headache   Patient is a 59 y.o. female presenting with headaches. The history is provided by the patient.  Headache Pain location:  Frontal Radiates to:  Does not radiate Severity currently:  4/10 Severity at highest:  10/10 Onset quality:  Gradual Duration:  5 hours Timing:  Constant Progression:  Unchanged Similar to prior headaches: yes   Context: not activity   Relieved by:  Nothing Ineffective treatments:  Aspirin Associated symptoms: congestion   Associated symptoms: no dizziness, no fever, no neck pain, no neck stiffness, no seizures and no weakness   Risk factors: does not have insomnia    HPI Comments: Victoria [name] is a 59 y.o. female who presents to the Emergency Department complaining of throbbing frontal migraine HA onset tonight. Pt states that she hasn't had a migraine HA in 4-5 years. Pt states that she has tried aspirin with no relief. Pt states that she is a dialysis pt. Pt states that she has been complaint with her dialysis treatments.    Past Medical History  Diagnosis Date  . Seasonal allergies   . H/O cardiovascular stress test     Myoview 08/24/12: Inferolateral and anteroseptal areas of scar, no findings of ischemia, EF 77%.  . Dialysis patient   . Complication of anesthesia ~ 2011    "they gave me a medicine that swolled me and mouth burning up" (08/23/2012)  . Hypertension     Now controlled by HD  . History of bronchitis     questions if she has it now bc throat is scratchy and coughing  . ESRD on dialysis     M/W/F on Jeneen Rinks   Past Surgical History  Procedure Laterality Date  . Parathyroidectomy  2012    With implant to left forearm  . Thyroidectomy, partial  2012    Isthmusectomy   . Total abdominal hysterectomy  2007  . Dilation and curettage of uterus  1970's    "when I was pregnant in my tubes" (08/23/2012)  . Thrombectomy and revision of arterioventous (av) goretex  graft  2012, 2013  . Dialysis fistula creation  1992    "left forearm" (08/23/2012)  . Arteriovenous graft placement  1990's; ~ 2011    "left upper arm; right forearm" (08/23/2012)  . Ankle fracture surgery  ~ 2009    "right" (08/23/2012)  . Revision of arteriovenous goretex graft  09/27/2012    Procedure: REVISION OF ARTERIOVENOUS GORETEX GRAFT;  Surgeon: Mal Misty, MD;  Location: [facility] OR;  Service: Vascular;  Laterality: Right;  1) Replacement of venous half of loop with 49mm Gortex graft  2) Excision of erroded pseudoaneurysm of graft with primary closure.  . Patellectomy  10/03/2012    Procedure: PATELLECTOMY;  Surgeon: Marin Shutter, MD;  Location: [place];  Service: Orthopedics;  Laterality: Right;  RIGHT PARTIAL PATELLECTOMY AND PATELLA TENDON REPAIR  . Patellar tendon repair  10/03/2012    Procedure: PATELLA TENDON REPAIR;  Surgeon: Marin Shutter, MD;  Location: [place];  Service: Orthopedics;  Laterality: Right;  . Revision of arteriovenous goretex graft Right 01/23/2013    Procedure: REVISION OF ARTERIOVENOUS GORETEX GRAFT;  Surgeon: Aaron Edelman  Starlyn Skeans, MD;  Location: [place];  Service: Vascular;  Laterality: Right;  Using piece of 62mm x 20cm Gortex graft.    Family History  Problem Relation Age of Onset  . Hypertension Father   . Thyroid disease Father   . Hypertension Sister   . Thyroid disease Sister    History  Substance Use Topics  . Smoking status: Never Smoker   . Smokeless tobacco: Never Used  . Alcohol Use: No     Comment: quit 2007   OB History    No data available      Review of Systems  Constitutional: Negative for fever.  HENT: Positive for congestion.   Musculoskeletal: Negative for neck pain and neck stiffness.  Skin: Negative for rash.  Neurological: Positive  for headaches. Negative for dizziness, seizures, facial asymmetry, speech difficulty and weakness.  All other systems reviewed and are negative.   Allergies  Lisinopril and Norvasc  Home Medications   Prior to Admission medications   Medication Sig Start Date End Date Taking? Authorizing Provider  acetaminophen (TYLENOL) 650 MG CR tablet Take 650 mg by mouth every 8 (eight) hours as needed for pain.    Historical Provider, MD  calcium elemental as carbonate (TUMS ULTRA 1000) 400 MG tablet Chew 4,000 mg by mouth 3 (three) times daily with meals.    Historical Provider, MD  Docusate Calcium (STOOL SOFTENER PO) Take 2 tablets by mouth once.    Historical Provider, MD  HYDROcodone-acetaminophen (NORCO/VICODIN) 5-325 MG per tablet Take 2 tablets by mouth every 4 (four) hours as needed. 12/31/14   Fransico Meadow, PA-C   BP 142/99 mmHg  Pulse 96  Temp(Src) 98 F (36.7 C) (Oral)  Resp 16  SpO2 99%   Physical Exam  Constitutional: She is oriented to person, place, and time. She appears well-developed and well-nourished. No distress.  HENT:  Head: Normocephalic and atraumatic.  Right Ear: Tympanic membrane normal.  Left Ear: Tympanic membrane normal.  Mouth/Throat: Oropharynx is clear and moist. No oropharyngeal exudate.  Eyes: Conjunctivae and EOM are normal. Pupils are equal, round, and reactive to light.  Neck: Normal range of motion. Neck supple. No tracheal deviation present.  Cardiovascular: Normal rate and intact distal pulses.   Pulmonary/Chest: Effort normal and breath sounds normal. No respiratory distress. She has no wheezes. She has no rales.  Abdominal: Soft. There is no tenderness. There is no rebound and no guarding.  Hyperactive bower sounds into thoracic cavity.   Musculoskeletal: Normal range of motion. She exhibits no edema.  Lymphadenopathy:    She has no cervical adenopathy.  Neurological: She is alert and oriented to person, place, and time. She has normal strength  and normal reflexes. She displays normal reflexes. No cranial nerve deficit. She exhibits normal muscle tone. Coordination normal.  Skin: Skin is warm and dry.  Psychiatric: She has a normal mood and affect. Her behavior is normal.  Nursing note and vitals reviewed.   ED Course  Procedures (including critical care time) DIAGNOSTIC STUDIES: Oxygen Saturation is 99% on RA, normal by my interpretation.    COORDINATION OF CARE: 2:32 AM-Discussed treatment plan with pt at bedside and pt agreed to plan.     Labs Review Labs Reviewed - No data to display  Imaging Review No results found.   EKG Interpretation None      MDM   Final diagnoses:  None   Pain consistent with her migraines.  Pain free post medication.  CT within 6  hours excludes SAH.  No signs of meningitis.  Safe for discharge at this time   I personally performed the services described in this documentation, which was scribed in my presence. The recorded information has been reviewed and is accurate.      Veatrice Kells, MD 03/17/15 [phone]

## 2015-04-07 ENCOUNTER — Encounter (HOSPITAL_COMMUNITY): Payer: Self-pay | Admitting: *Deleted

## 2015-04-07 ENCOUNTER — Emergency Department (HOSPITAL_COMMUNITY)
Admission: EM | Admit: 2015-04-07 | Discharge: 2015-04-07 | Disposition: A | Discharge status: Home / Self Care | Payer: Medicare Other | Attending: Emergency Medicine | Admitting: Emergency Medicine

## 2015-04-07 DIAGNOSIS — K2970 Gastritis, unspecified, without bleeding: Secondary | ICD-10-CM | POA: Diagnosis not present

## 2015-04-07 DIAGNOSIS — I120 Hypertensive chronic kidney disease with stage 5 chronic kidney disease or end stage renal disease: Secondary | ICD-10-CM | POA: Diagnosis not present

## 2015-04-07 DIAGNOSIS — R1010 Upper abdominal pain, unspecified: Secondary | ICD-10-CM | POA: Diagnosis present

## 2015-04-07 DIAGNOSIS — Z992 Dependence on renal dialysis: Secondary | ICD-10-CM | POA: Insufficient documentation

## 2015-04-07 DIAGNOSIS — Z8709 Personal history of other diseases of the respiratory system: Secondary | ICD-10-CM | POA: Insufficient documentation

## 2015-04-07 DIAGNOSIS — R21 Rash and other nonspecific skin eruption: Secondary | ICD-10-CM | POA: Diagnosis not present

## 2015-04-07 DIAGNOSIS — N186 End stage renal disease: Secondary | ICD-10-CM | POA: Diagnosis not present

## 2015-04-07 LAB — LIPASE, BLOOD: Lipase: 61 U/L — ABNORMAL HIGH (ref 22–51)

## 2015-04-07 LAB — CBC
HEMATOCRIT: 45.8 % (ref 36.0–46.0)
HEMOGLOBIN: 15.7 g/dL — AB (ref 12.0–15.0)
MCH: 29.7 pg (ref 26.0–34.0)
MCHC: 34.3 g/dL (ref 30.0–36.0)
MCV: 86.7 fL (ref 78.0–100.0)
PLATELETS: 175 10*3/uL (ref 150–400)
RBC: 5.28 MIL/uL — AB (ref 3.87–5.11)
RDW: 14.5 % (ref 11.5–15.5)
WBC: 12.8 10*3/uL — ABNORMAL HIGH (ref 4.0–10.5)

## 2015-04-07 LAB — COMPREHENSIVE METABOLIC PANEL
ALT: 17 U/L (ref 14–54)
AST: 22 U/L (ref 15–41)
Albumin: 3.3 g/dL — ABNORMAL LOW (ref 3.5–5.0)
Alkaline Phosphatase: 52 U/L (ref 38–126)
Anion gap: 19 — ABNORMAL HIGH (ref 5–15)
BILIRUBIN TOTAL: 0.7 mg/dL (ref 0.3–1.2)
BUN: 57 mg/dL — AB (ref 6–20)
CALCIUM: 7.8 mg/dL — AB (ref 8.9–10.3)
CO2: 27 mmol/L (ref 22–32)
Chloride: 96 mmol/L — ABNORMAL LOW (ref 101–111)
Creatinine, Ser: 14.58 mg/dL — ABNORMAL HIGH (ref 0.44–1.00)
GFR, EST AFRICAN AMERICAN: 3 mL/min — AB (ref >60)
GFR, EST NON AFRICAN AMERICAN: 2 mL/min — AB (ref >60)
GLUCOSE: 116 mg/dL — AB (ref 65–99)
POTASSIUM: 4.4 mmol/L (ref 3.5–5.1)
Sodium: 142 mmol/L (ref 135–145)
TOTAL PROTEIN: 6.9 g/dL (ref 6.5–8.1)

## 2015-04-07 MED ORDER — PANTOPRAZOLE SODIUM 40 MG PO TBEC: 40.0000 mg | DELAYED_RELEASE_TABLET | Freq: Every day | ORAL | Status: DC

## 2015-04-07 MED ORDER — GI COCKTAIL ~~LOC~~
30.0000 mL | Freq: Once | ORAL | Status: AC
Start: 2015-04-07 — End: 2015-04-07
  Administered 2015-04-07: 30 mL via ORAL
  Filled 2015-04-07: qty 30

## 2015-04-07 MED ORDER — PREDNISONE 10 MG PO TABS: 60.0000 mg | ORAL_TABLET | Freq: Every day | ORAL | Status: DC

## 2015-04-07 MED ORDER — PANTOPRAZOLE SODIUM 40 MG PO TBEC
40.0000 mg | DELAYED_RELEASE_TABLET | Freq: Once | ORAL | Status: AC
Start: 2015-04-07 — End: 2015-04-07
  Administered 2015-04-07: 40 mg via ORAL
  Filled 2015-04-07: qty 1

## 2015-04-07 MED ORDER — ONDANSETRON 4 MG PO TBDP
4.0000 mg | ORAL_TABLET | Freq: Once | ORAL | Status: AC
  Administered 2015-04-07: 4 mg via ORAL
  Filled 2015-04-07: qty 1

## 2015-04-07 NOTE — ED Provider Notes (Signed)
This chart was scribed for  [name], DO by Altamease Oiler, ED Scribe. This patient was seen in room D34C/D34C and the patient's care was started at 2:53 AM.  TIME SEEN: 2:53 AM   CHIEF COMPLAINT: Abdominal Pain  HPI: Victoria [name] is a 59 y.o. female who presents to the Emergency Department complaining of constant upper abdominal pain with onset just PTA. The pain is rated 9/10 in severity and she states that it feels as if a "knot" is there. She had similar pain in the past with gas. Associated symptoms include nausea and 1 episode of vomiting. Pt denies fever, bitter/sour taste in the mouth, and diarrhea. Pt also complains of a rash on her back for 1 week. No new exposures. Rash is pruritic. Has tried Benadryl without relief. M/W/F dialysis pt. Last dialysis treatment was on 04/04/15. Denies chest pressure as of breath. No bloody stool or melena. No heavy NSAID or alcohol use. She has had a hysterectomy.  ROS: See HPI Constitutional: no fever  Eyes: no drainage  ENT: no runny nose   Cardiovascular:  no chest pain  Resp: no SOB  GI: vomiting GU: no dysuria Integumentary: rash  Allergy: no hives  Musculoskeletal: no leg swelling  Neurological: no slurred speech ROS otherwise negative  PAST MEDICAL HISTORY/PAST SURGICAL HISTORY:  Past Medical History  Diagnosis Date  . Seasonal allergies   . H/O cardiovascular stress test     Myoview 08/24/12: Inferolateral and anteroseptal areas of scar, no findings of ischemia, EF 77%.  . Dialysis patient   . Complication of anesthesia ~ 2011    "they gave me a medicine that swolled me and mouth burning up" (08/23/2012)  . Hypertension     Now controlled by HD  . History of bronchitis     questions if she has it now bc throat is scratchy and coughing  . ESRD on dialysis     M/W/F on [place]:  Prior to Admission medications   Medication Sig Start Date End Date Taking? Authorizing Provider  acetaminophen (TYLENOL)  650 MG CR tablet Take 650 mg by mouth every 8 (eight) hours as needed for pain.    Historical Provider, MD    ALLERGIES:  Allergies  Allergen Reactions  . Lisinopril Swelling and Other (See Comments)    "made my chest hurt"  . Norvasc [Amlodipine Besylate] Swelling    SOCIAL HISTORY:  History  Substance Use Topics  . Smoking status: Never Smoker   . Smokeless tobacco: Never Used  . Alcohol Use: No     Comment: quit 2007    FAMILY HISTORY: Family History  Problem Relation Age of Onset  . Hypertension Father   . Thyroid disease Father   . Hypertension Sister   . Thyroid disease Sister     EXAM: BP 127/89 mmHg  Pulse 96  Temp(Src) 98 F (36.7 C)  Resp 20  Ht 5' 3.5" (1.613 m)  Wt 200 lb (90.719 kg)  BMI 34.87 kg/m2  SpO2 98% CONSTITUTIONAL: Alert and oriented and responds appropriately to questions. Well-appearing; well-nourished HEAD: Normocephalic EYES: Conjunctivae clear, PERRL ENT: normal nose; no rhinorrhea; moist mucous membranes; pharynx without lesions noted NECK: Supple, no meningismus, no LAD  CARD: RRR; S1 and S2 appreciated; no murmurs, no clicks, no rubs, no gallops RESP: Normal chest excursion without splinting or tachypnea; breath sounds clear and equal bilaterally; no wheezes, no rhonchi, no rales ABD/GI: Normal bowel sounds; non-distended; soft, very minimally  tender in the epigastric region , no rebound, no guarding, negative Murphy's sign BACK:  The back appears normal and is non-tender to palpation, there is no CVA tenderness EXT: Normal ROM in all joints; non-tender to palpation; no edema; normal capillary refill; no cyanosis    SKIN: Normal color for age and race; warm, scattered erythematous macular lesions to the left back with no vesicles, no blister to desquamation, no petechia or purpura, no lesions involving the mucous membranes NEURO: Moves all extremities equally PSYCH: The patient's mood and manner are appropriate. Grooming and personal  hygiene are appropriate.  MEDICAL DECISION MAKING: Patient here with symptoms possibly due to gastritis, GERD. Pain gone after GI cocktail, Protonix. EKG shows no new ischemic changes. Negative Murphy sign. Lipase is very minimally elevated but doubt pancreatitis. LFTs normal. I feel she is safe to be discharged home with outpatient follow-up. We'll start her on Protonix.  Patient's rash is nonspecific. Does not appear to be allergic in nature or life-threatening. Does not appear to be shingles. Will discharge on steroid taper. She is not a diabetic.  Discussed return precautions and supportive care instructions. She verbalized understanding and is comfortable with plan.     EKG Interpretation  Date/Time:  Monday April 07 2015 03:18:25 EDT Ventricular Rate:  87 PR Interval:  199 QRS Duration: 94 QT Interval:  428 QTC Calculation: 515 R Axis:   -53 Text Interpretation:  Sinus rhythm Inferior infarct, old Probable anterolateral infarct, old Prolonged QT interval No significant change since last tracing Confirmed by Dillan Lunden,  DO, Anijah Spohr ([id number]) on 04/07/2015 3:42:34 AM         I personally performed the services described in this documentation, which was scribed in my presence. The recorded information has been reviewed and is accurate.   Groom, DO 04/07/15 [phone]

## 2015-04-07 NOTE — ED Notes (Signed)
Paien presents with c/o "gas on her stomach", a knot in the middle opf her stomach and a rash "all over her back"

## 2015-04-07 NOTE — Discharge Instructions (Signed)
Gastritis, Adult Gastritis is soreness and swelling (inflammation) of the lining of the stomach. Gastritis can develop as a sudden onset (acute) or long-term (chronic) condition. If gastritis is not treated, it can lead to stomach bleeding and ulcers. CAUSES  Gastritis occurs when the stomach lining is weak or damaged. Digestive juices from the stomach then inflame the weakened stomach lining. The stomach lining may be weak or damaged due to viral or bacterial infections. One common bacterial infection is the Helicobacter pylori infection. Gastritis can also result from excessive alcohol consumption, taking certain medicines, or having too much acid in the stomach.  SYMPTOMS  In some cases, there are no symptoms. When symptoms are present, they may include:  Pain or a burning sensation in the upper abdomen.  Nausea.  Vomiting.  An uncomfortable feeling of fullness after eating. DIAGNOSIS  Your caregiver may suspect you have gastritis based on your symptoms and a physical exam. To determine the cause of your gastritis, your caregiver may perform the following:  Blood or stool tests to check for the H pylori bacterium.  Gastroscopy. A thin, flexible tube (endoscope) is passed down the esophagus and into the stomach. The endoscope has a light and camera on the end. Your caregiver uses the endoscope to view the inside of the stomach.  Taking a tissue sample (biopsy) from the stomach to examine under a microscope. TREATMENT  Depending on the cause of your gastritis, medicines may be prescribed. If you have a bacterial infection, such as an H pylori infection, antibiotics may be given. If your gastritis is caused by too much acid in the stomach, H2 blockers or antacids may be given. Your caregiver may recommend that you stop taking aspirin, ibuprofen, or other nonsteroidal anti-inflammatory drugs (NSAIDs). HOME CARE INSTRUCTIONS  Only take over-the-counter or prescription medicines as directed by  your caregiver.  If you were given antibiotic medicines, take them as directed. Finish them even if you start to feel better.  Drink enough fluids to keep your urine clear or pale yellow.  Avoid foods and drinks that make your symptoms worse, such as:  Caffeine or alcoholic drinks.  Chocolate.  Peppermint or mint flavorings.  Garlic and onions.  Spicy foods.  Citrus fruits, such as oranges, lemons, or limes.  Tomato-based foods such as sauce, chili, salsa, and pizza.  Fried and fatty foods.  Eat small, frequent meals instead of large meals. SEEK IMMEDIATE MEDICAL CARE IF:   You have black or dark red stools.  You vomit blood or material that looks like coffee grounds.  You are unable to keep fluids down.  Your abdominal pain gets worse.  You have a fever.  You do not feel better after 1 week.  You have any other questions or concerns. MAKE SURE YOU:  Understand these instructions.  Will watch your condition.  Will get help right away if you are not doing well or get worse. Document Released: 08/31/2001 Document Revised: 03/07/2012 Document Reviewed: 10/20/2011 [facility] Patient Information 2015 [place], [place]. This information is not intended to replace advice given to you by your health care provider. Make sure you discuss any questions you have with your health care provider.   Food Choices for Gastroesophageal Reflux Disease When you have gastroesophageal reflux disease (GERD), the foods you eat and your eating habits are very important. Choosing the right foods can help ease the discomfort of GERD. WHAT GENERAL GUIDELINES DO I NEED TO FOLLOW?  Choose fruits, vegetables, whole grains, low-fat dairy products, and  low-fat meat, fish, and poultry.  Limit fats such as oils, salad dressings, butter, nuts, and avocado.  Keep a food diary to identify foods that cause symptoms.  Avoid foods that cause reflux. These may be different for different people.  Eat  frequent small meals instead of three large meals each day.  Eat your meals slowly, in a relaxed setting.  Limit fried foods.  Cook foods using methods other than frying.  Avoid drinking alcohol.  Avoid drinking large amounts of liquids with your meals.  Avoid bending over or lying down until 2-3 hours after eating. WHAT FOODS ARE NOT RECOMMENDED? The following are some foods and drinks that may worsen your symptoms: Vegetables Tomatoes. Tomato juice. Tomato and spaghetti sauce. Chili peppers. Onion and garlic. Horseradish. Fruits Oranges, grapefruit, and lemon (fruit and juice). Meats High-fat meats, fish, and poultry. This includes hot dogs, ribs, ham, sausage, salami, and bacon. Dairy Whole milk and chocolate milk. Sour cream. Cream. Butter. Ice cream. Cream cheese.  Beverages Coffee and tea, with or without caffeine. Carbonated beverages or energy drinks. Condiments Hot sauce. Barbecue sauce.  Sweets/Desserts Chocolate and cocoa. Donuts. Peppermint and spearmint. Fats and Oils High-fat foods, including [place] fries and potato chips. Other Vinegar. Strong spices, such as black pepper, white pepper, red pepper, cayenne, curry powder, cloves, ginger, and chili powder. The items listed above may not be a complete list of foods and beverages to avoid. Contact your dietitian for more information. Document Released: 09/06/2005 Document Revised: 09/11/2013 Document Reviewed: 07/11/2013 [facility] Patient Information 2015 [place], [place]. This information is not intended to replace advice given to you by your health care provider. Make sure you discuss any questions you have with your health care provider.    Rash A rash is a change in the color or texture of your skin. There are many different types of rashes. You may have other problems that accompany your rash. CAUSES   Infections.  Allergic reactions. This can include allergies to pets or foods.  Certain  medicines.  Exposure to certain chemicals, soaps, or cosmetics.  Heat.  Exposure to poisonous plants.  Tumors, both cancerous and noncancerous. SYMPTOMS   Redness.  Scaly skin.  Itchy skin.  Dry or cracked skin.  Bumps.  Blisters.  Pain. DIAGNOSIS  Your caregiver may do a physical exam to determine what type of rash you have. A skin sample (biopsy) may be taken and examined under a microscope. TREATMENT  Treatment depends on the type of rash you have. Your caregiver may prescribe certain medicines. For serious conditions, you may need to see a skin doctor (dermatologist). HOME CARE INSTRUCTIONS   Avoid the substance that caused your rash.  Do not scratch your rash. This can cause infection.  You may take cool baths to help stop itching.  Only take over-the-counter or prescription medicines as directed by your caregiver.  Keep all follow-up appointments as directed by your caregiver. SEEK IMMEDIATE MEDICAL CARE IF:  You have increasing pain, swelling, or redness.  You have a fever.  You have new or severe symptoms.  You have body aches, diarrhea, or vomiting.  Your rash is not better after 3 days. MAKE SURE YOU:  Understand these instructions.  Will watch your condition.  Will get help right away if you are not doing well or get worse. Document Released: 08/27/2002 Document Revised: 11/29/2011 Document Reviewed: 06/21/2011 [facility] Patient Information 2015 [place], [place]. This information is not intended to replace advice given to you by your health care  provider. Make sure you discuss any questions you have with your health care provider. ° °

## 2015-04-18 ENCOUNTER — Other Ambulatory Visit: Payer: Self-pay

## 2015-04-18 DIAGNOSIS — Z1231 Encounter for screening mammogram for malignant neoplasm of breast: Secondary | ICD-10-CM

## 2015-04-29 ENCOUNTER — Ambulatory Visit
Admission: RE | Admit: 2015-04-29 | Discharge: 2015-04-29 | Disposition: A | Discharge status: Home / Self Care | Payer: Medicare Other | Source: Ambulatory Visit

## 2015-04-29 DIAGNOSIS — Z1231 Encounter for screening mammogram for malignant neoplasm of breast: Secondary | ICD-10-CM

## 2015-06-26 ENCOUNTER — Other Ambulatory Visit (HOSPITAL_COMMUNITY): Payer: Medicare Other

## 2015-06-26 ENCOUNTER — Inpatient Hospital Stay (HOSPITAL_COMMUNITY): Admission: RE | Admit: 2015-06-26 | Payer: Medicare Other | Source: Ambulatory Visit

## 2015-07-01 ENCOUNTER — Encounter: Payer: Self-pay | Admitting: Vascular Surgery

## 2015-07-03 ENCOUNTER — Encounter: Payer: Self-pay | Admitting: Vascular Surgery

## 2015-07-03 ENCOUNTER — Ambulatory Visit (INDEPENDENT_AMBULATORY_CARE_PROVIDER_SITE_OTHER): Payer: Medicare Other | Admitting: Vascular Surgery

## 2015-07-03 VITALS — BP 92/57 | HR 93 | Ht 63.5 in | Wt 199.1 lb

## 2015-07-03 DIAGNOSIS — Y841 Kidney dialysis as the cause of abnormal reaction of the patient, or of later complication, without mention of misadventure at the time of the procedure: Secondary | ICD-10-CM | POA: Diagnosis not present

## 2015-07-03 DIAGNOSIS — N186 End stage renal disease: Secondary | ICD-10-CM

## 2015-07-03 DIAGNOSIS — T829XXA Unspecified complication of cardiac and vascular prosthetic device, implant and graft, initial encounter: Secondary | ICD-10-CM

## 2015-07-03 NOTE — Progress Notes (Signed)
Patient is a 59 year old female referred by Dr. Augustin Coupe for evaluation of AV graft aneurysms. The patient has had multiple revisions of her AV graft in 2011, 2012 and replacement of the arterial and venous limbs in 2014. She recently had a shuntogram with angioplasty of the venous outflow. At the time of this she was noted to have 2 aneurysms in her graft one on the arterial side and one on the venous side. She has had no bleeding episodes. She has no dependent skin. There is no eschar over the aneurysm. She states the aneurysms have not changed recently. She states they have been present for quite some time. She states her graft is been working well on dialysis since her recent angioplasty. Her dialysis today is Monday Wednesday Friday. Chronic medical problems include hypertension and bronchitis both of which are currently stable.  Past Medical History  Diagnosis Date  . Seasonal allergies   . H/O cardiovascular stress test     Myoview 08/24/12: Inferolateral and anteroseptal areas of scar, no findings of ischemia, EF 77%.  . Dialysis patient ([place])   . Complication of anesthesia ~ 2011    "they gave me a medicine that swolled me and mouth burning up" (08/23/2012)  . Hypertension     Now controlled by HD  . History of bronchitis     questions if she has it now bc throat is scratchy and coughing  . ESRD on dialysis [facility])     M/W/F on [employer]    Past Surgical History  Procedure Laterality Date  . Parathyroidectomy  2012    With implant to left forearm  . Thyroidectomy, partial  2012    Isthmusectomy   . Total abdominal hysterectomy  2007  . Dilation and curettage of uterus  1970's    "when I was pregnant in my tubes" (08/23/2012)  . Thrombectomy and revision of arterioventous (av) goretex  graft  2012, 2013  . Dialysis fistula creation  1992    "left forearm" (08/23/2012)  . Arteriovenous graft placement  1990's; ~ 2011    "left upper arm; right forearm" (08/23/2012)  . Ankle fracture  surgery  ~ 2009    "right" (08/23/2012)  . Revision of arteriovenous goretex graft  09/27/2012    Procedure: REVISION OF ARTERIOVENOUS GORETEX GRAFT;  Surgeon: Mal Misty, MD;  Location: [facility] OR;  Service: Vascular;  Laterality: Right;  1) Replacement of venous half of loop with 45mm Gortex graft  2) Excision of erroded pseudoaneurysm of graft with primary closure.  . Patellectomy  10/03/2012    Procedure: PATELLECTOMY;  Surgeon: Marin Shutter, MD;  Location: [place];  Service: Orthopedics;  Laterality: Right;  RIGHT PARTIAL PATELLECTOMY AND PATELLA TENDON REPAIR  . Patellar tendon repair  10/03/2012    Procedure: PATELLA TENDON REPAIR;  Surgeon: Marin Shutter, MD;  Location: [place];  Service: Orthopedics;  Laterality: Right;  . Revision of arteriovenous goretex graft Right 01/23/2013    Procedure: REVISION OF ARTERIOVENOUS GORETEX GRAFT;  Surgeon: Conrad [place], MD;  Location: [place];  Service: Vascular;  Laterality: Right;  Using piece of 56mm x 20cm Gortex graft.    Current Outpatient Prescriptions on File Prior to Visit  Medication Sig Dispense Refill  . pantoprazole (PROTONIX) 40 MG tablet Take 1 tablet (40 mg total) by mouth daily. 30 tablet 1  . predniSONE (DELTASONE) 10 MG tablet Take 6 tablets (60 mg total) by mouth daily. Take 60 mg 1 day then 50 mg 1  day and then 40 mg 1 day then 30 mg 1 day then 20 mg 1 day then 10 mg 1 day then stop (Patient not taking: Reported on 07/03/2015) 21 tablet 0   No current facility-administered medications on file prior to visit.    Social History   Social History  . Marital Status: Married    Spouse Name: N/A  . Number of Children: N/A  . Years of Education: N/A   Occupational History  . Not on file.   Social History Main Topics  . Smoking status: Never Smoker   . Smokeless tobacco: Never Used  . Alcohol Use: No     Comment: quit 2007  . Drug Use: No  . Sexual Activity: Yes   Other Topics Concern  . Not on file   Social History Narrative     Family History  Problem Relation Age of Onset  . Hypertension Father   . Thyroid disease Father   . Hypertension Sister   . Thyroid disease Sister     Review of systems: She denies shortness of breath. She denies chest pain.  Physical exam:  Filed Vitals:   07/03/15 1127  BP: 92/57  Pulse: 93  Height: 5' 3.5" (1.613 m)  Weight: 199 lb 1.6 oz (90.311 kg)  SpO2: 96%    Right upper extremity: Graft aneurysm at the 3:00 position right forearm and 9:00 position no thin skin no eschar audible bruit and graft   Assessment: Degenerative aneurysms right forearm AV graft not currently at risk of bleeding.  Plan: Patient was advised on signs and symptoms to look for for enlarging aneurysm thinned out skin or development of a scab on the aneurysmal segment. Otherwise I would advise continued observation. If these aneurysms become more risk of bleeding and we would consider replacement of one limb or the other of the graft. The patient will follow-up on as-needed basis.  [name], MD [facility] Office: [phone] Pager: [phone]

## 2015-07-14 ENCOUNTER — Encounter: Payer: Self-pay | Admitting: Nephrology

## 2015-09-26 ENCOUNTER — Encounter: Payer: Self-pay | Admitting: Vascular Surgery

## 2015-10-02 ENCOUNTER — Other Ambulatory Visit: Payer: Self-pay

## 2015-10-02 ENCOUNTER — Encounter: Payer: Self-pay | Admitting: Vascular Surgery

## 2015-10-02 ENCOUNTER — Ambulatory Visit (INDEPENDENT_AMBULATORY_CARE_PROVIDER_SITE_OTHER): Payer: Medicare Other | Admitting: Vascular Surgery

## 2015-10-02 VITALS — BP 116/63 | HR 73 | Temp 97.3°F | Resp 20 | Ht 62.5 in | Wt 200.8 lb

## 2015-10-02 DIAGNOSIS — Y841 Kidney dialysis as the cause of abnormal reaction of the patient, or of later complication, without mention of misadventure at the time of the procedure: Secondary | ICD-10-CM | POA: Diagnosis not present

## 2015-10-02 DIAGNOSIS — N186 End stage renal disease: Secondary | ICD-10-CM

## 2015-10-02 DIAGNOSIS — T829XXA Unspecified complication of cardiac and vascular prosthetic device, implant and graft, initial encounter: Secondary | ICD-10-CM

## 2015-10-02 DIAGNOSIS — T849XXA Unspecified complication of internal orthopedic prosthetic device, implant and graft, initial encounter: Secondary | ICD-10-CM | POA: Insufficient documentation

## 2015-10-02 NOTE — Progress Notes (Signed)
Patient is a 60 year old female referred by Dr. Augustin Coupe for evaluation of AV graft aneurysms. She was seen for similar problem October 2016. At that time the skin was intact and not compromised. The patient has had multiple revisions of her AV graft in 2011, 2012 and replacement of the arterial and venous limbs in 2014. She recently had a shuntogram with angioplasty of the venous outflow. At the time of this she was noted to have 2 aneurysms in her graft one on the arterial side and one on the venous side. She has had no bleeding episodes.  There is no eschar over the aneurysm. She states the skin over one of the aneurysms has become more thinned. She states they have been present for quite some time. She states her graft has been working well on dialysis but she did have angioplasty of the outflow vein proximally 3 weeks ago. Her dialysis today is Monday Wednesday Friday. Chronic medical problems include hypertension and bronchitis both of which are currently stable.  Review of systems: She denies shortness of breath. She denies chest pain.     Past Medical History   Diagnosis  Date   .  Seasonal allergies     .  H/O cardiovascular stress test         Myoview 08/24/12: Inferolateral and anteroseptal areas of scar, no findings of ischemia, EF 77%.   .  Dialysis patient ([place])     .  Complication of anesthesia  ~ 2011       "they gave me a medicine that swolled me and mouth burning up" (08/23/2012)   .  Hypertension         Now controlled by HD   .  History of bronchitis         questions if she has it now bc throat is scratchy and coughing   .  ESRD on dialysis [facility])         M/W/F on [employer]       Past Surgical History   Procedure  Laterality  Date   .  Parathyroidectomy    2012       With implant to left forearm   .  Thyroidectomy, partial    2012       Isthmusectomy    .  Total abdominal hysterectomy    2007   .  Dilation and curettage of uterus    1970's       "when I was pregnant in my  tubes" (08/23/2012)   .  Thrombectomy and revision of arterioventous (av) goretex  graft    2012, 2013   .  Dialysis fistula creation    1992       "left forearm" (08/23/2012)   .  Arteriovenous graft placement    1990's; ~ 2011       "left upper arm; right forearm" (08/23/2012)   .  Ankle fracture surgery    ~ 2009       "right" (08/23/2012)   .  Revision of arteriovenous goretex graft    09/27/2012       Procedure: REVISION OF ARTERIOVENOUS GORETEX GRAFT;  Surgeon: Mal Misty, MD;  Location: [facility] OR;  Service: Vascular;  Laterality: Right;  1) Replacement of venous half of loop with 21mm Gortex graft  2) Excision of erroded pseudoaneurysm of graft with primary closure.   .  Patellectomy    10/03/2012       Procedure: PATELLECTOMY;  Surgeon: Metta Clines Supple,  MD;  Location: [place];  Service: Orthopedics;  Laterality: Right;  RIGHT PARTIAL PATELLECTOMY AND PATELLA TENDON REPAIR   .  Patellar tendon repair    10/03/2012       Procedure: PATELLA TENDON REPAIR;  Surgeon: Marin Shutter, MD;  Location: [place];  Service: Orthopedics;  Laterality: Right;   .  Revision of arteriovenous goretex graft  Right  01/23/2013       Procedure: REVISION OF ARTERIOVENOUS GORETEX GRAFT;  Surgeon: Conrad [place], MD;  Location: [place];  Service: Vascular;  Laterality: Right;  Using piece of 68mm x 20cm Gortex graft.     Current Outpatient Prescriptions on File Prior to Visit   Medication  Sig  Dispense  Refill   .  pantoprazole (PROTONIX) 40 MG tablet  Take 1 tablet (40 mg total) by mouth daily.  30 tablet  1   .  predniSONE (DELTASONE) 10 MG tablet  Take 6 tablets (60 mg total) by mouth daily. Take 60 mg 1 day then 50 mg 1 day and then 40 mg 1 day then 30 mg 1 day then 20 mg 1 day then 10 mg 1 day then stop (Patient not taking: Reported on 07/03/2015)  21 tablet  0      No current facility-administered medications on file prior to visit.       Social History      Social History   .  Marital Status:  Married        Spouse Name:  N/A   .  Number of Children:  N/A   .  Years of Education:  N/A      Occupational History   .  Not on file.      Social History Main Topics   .  Smoking status:  Never Smoker    .  Smokeless tobacco:  Never Used   .  Alcohol Use:  No         Comment: quit 2007   .  Drug Use:  No   .  Sexual Activity:  Yes      Other Topics  Concern   .  Not on file      Social History Narrative       Family History   Problem  Relation  Age of Onset   .  Hypertension  Father     .  Thyroid disease  Father     .  Hypertension  Sister     .  Thyroid disease  Sister       Review of systems: She denies shortness of breath. She denies chest pain.  Physical exam:    Filed Vitals:   10/02/15 1138  BP: 116/63  Pulse: 73  Temp: 97.3 F (36.3 C)  TempSrc: Oral  Resp: 20  Height: 5' 2.5" (1.588 m)  Weight: 200 lb 12.8 oz (91.082 kg)   Right upper extremity: Graft aneurysm at the 3:00 position right forearm radial aspect and out scan with small central eschar and 9:00 position no thin skin no eschar audible bruit and graft   Assessment: Degenerative aneurysms right forearm AV graft; the radial limb of the graft has now had thinned out compromised skin at risk for bleeding  Plan: Revision right forearm AV graft June or 17th 2017. Risks benefits possible palpitations and procedure details were discussed with the patient today including not limited to bleeding infection graft thrombosis. She understands and agrees to proceed.   [name], MD [facility] [place] Office: [phone] Pager: [phone]

## 2015-10-06 ENCOUNTER — Encounter (HOSPITAL_COMMUNITY): Payer: Self-pay | Admitting: *Deleted

## 2015-10-06 MED ORDER — DEXTROSE 5 % IV SOLN
1.5000 g | INTRAVENOUS | Status: AC
  Administered 2015-10-07: 1.5 g via INTRAVENOUS

## 2015-10-06 MED ORDER — CHLORHEXIDINE GLUCONATE CLOTH 2 % EX PADS: 6.0000 | MEDICATED_PAD | Freq: Once | CUTANEOUS | Status: DC

## 2015-10-06 MED ORDER — SODIUM CHLORIDE 0.9 % IV SOLN
INTRAVENOUS | Status: DC
  Administered 2015-10-07: 07:00:00 via INTRAVENOUS

## 2015-10-06 NOTE — Progress Notes (Signed)
Anesthesia Chart Review:  Pt is 60 year old female scheduled for revision of R AV goretex graft on 10/07/2015 with Dr. Oneida Alar.   Pt is a same day work up.   PMH includes:  HTN, ESRD on HD. Never smoker. BMI 36. S/p AV graft revision 01/23/13 and 09/27/12. S/p R patellectomy 10/03/12.   Anesthesia history: in 2011, "they gave me a medicine that swolled me and mouth burning up". Unknown medication. Pt is allergic to lisinopril and amlodipine.   Pt will need labs DOS.   EKG 04/07/15: Sinus rhythm. Inferior infarct, old. Probable anterolateral infarct, old. Prolonged QT interval. Appears unchanged when compared to EKG dated 03/29/13.   Echo 10/31/12:  - Left ventricle: The cavity size was normal. Wall thickness was increased in a pattern of mild LVH. Systolic function was normal. The estimated ejection fraction was in the range of 55% to 65%. Wall motion was normal; there were no regional wall motion abnormalities. Doppler parameters are consistent with abnormal left ventricular relaxation (grade 1 diastolic dysfunction). - Mitral valve: Mild regurgitation. - Atrial septum: No defect or patent foramen ovale was identified.  Nuclear stress test 08/24/12:  1. Inferolateral and anteroseptal areas of scar. 2. No findings for myocardial ischemia. 3. Normal wall motion and calculated ejection fraction at 77%.  If labs acceptable DOS, I anticipate pt can proceed with surgery as scheduled.   Willeen Cass, FNP-BC [facility]/Anesthesiology Phone: [phone] 10/06/2015 2:20 PM

## 2015-10-06 NOTE — Progress Notes (Signed)
Pt denies SOB, chest pain, and being under the care of a cardiologist. Pt denies having a cardiac cath. Pt denies having a CXR within the last year. Pt made aware to stop otc vitamins, fish oil, herbal medications and NSAID's. Pt verbalized understanding of all pre-op instructions. Pt chart forwarded to anesthesia to review  EKG and anesthesia complication.

## 2015-10-07 ENCOUNTER — Ambulatory Visit (HOSPITAL_COMMUNITY): Payer: Medicare Other | Admitting: Emergency Medicine

## 2015-10-07 ENCOUNTER — Encounter (HOSPITAL_COMMUNITY): Payer: Self-pay | Admitting: Certified Registered Nurse Anesthetist

## 2015-10-07 ENCOUNTER — Encounter (HOSPITAL_COMMUNITY)
Admission: RE | Disposition: A | Discharge status: Home / Self Care | Payer: Self-pay | Source: Ambulatory Visit | Attending: Vascular Surgery

## 2015-10-07 ENCOUNTER — Ambulatory Visit (HOSPITAL_COMMUNITY)
Admission: RE | Admit: 2015-10-07 | Discharge: 2015-10-07 | Disposition: A | Discharge status: Home / Self Care | Payer: Medicare Other | Source: Ambulatory Visit | Attending: Vascular Surgery | Admitting: Vascular Surgery

## 2015-10-07 DIAGNOSIS — Y832 Surgical operation with anastomosis, bypass or graft as the cause of abnormal reaction of the patient, or of later complication, without mention of misadventure at the time of the procedure: Secondary | ICD-10-CM | POA: Diagnosis not present

## 2015-10-07 DIAGNOSIS — N186 End stage renal disease: Secondary | ICD-10-CM | POA: Diagnosis not present

## 2015-10-07 DIAGNOSIS — Z992 Dependence on renal dialysis: Secondary | ICD-10-CM | POA: Insufficient documentation

## 2015-10-07 DIAGNOSIS — I120 Hypertensive chronic kidney disease with stage 5 chronic kidney disease or end stage renal disease: Secondary | ICD-10-CM | POA: Insufficient documentation

## 2015-10-07 DIAGNOSIS — T82898A Other specified complication of vascular prosthetic devices, implants and grafts, initial encounter: Secondary | ICD-10-CM | POA: Diagnosis present

## 2015-10-07 HISTORY — PX: REVISION OF ARTERIOVENOUS GORETEX GRAFT: SHX6073

## 2015-10-07 LAB — POCT I-STAT 4, (NA,K, GLUC, HGB,HCT)
Glucose, Bld: 85 mg/dL (ref 65–99)
HEMATOCRIT: 53 % — AB (ref 36.0–46.0)
HEMOGLOBIN: 18 g/dL — AB (ref 12.0–15.0)
Potassium: 4.1 mmol/L (ref 3.5–5.1)
Sodium: 139 mmol/L (ref 135–145)

## 2015-10-07 SURGERY — REVISION OF ARTERIOVENOUS GORETEX GRAFT
Anesthesia: General | Site: Arm Lower | Laterality: Right

## 2015-10-07 MED ORDER — THROMBIN 20000 UNITS EX SOLR
CUTANEOUS | Status: DC | PRN
  Administered 2015-10-07: 20 mL via TOPICAL

## 2015-10-07 MED ORDER — PROPOFOL 10 MG/ML IV BOLUS
INTRAVENOUS | Status: AC
  Filled 2015-10-07: qty 40

## 2015-10-07 MED ORDER — HEPARIN SODIUM (PORCINE) 1000 UNIT/ML IJ SOLN
INTRAMUSCULAR | Status: AC
  Filled 2015-10-07: qty 1

## 2015-10-07 MED ORDER — THROMBIN 20000 UNITS EX SOLR
CUTANEOUS | Status: AC
  Filled 2015-10-07: qty 20000

## 2015-10-07 MED ORDER — MIDAZOLAM HCL 5 MG/5ML IJ SOLN
INTRAMUSCULAR | Status: DC | PRN
  Administered 2015-10-07: 2 mg via INTRAVENOUS

## 2015-10-07 MED ORDER — FENTANYL CITRATE (PF) 100 MCG/2ML IJ SOLN: 25.0000 ug | INTRAMUSCULAR | Status: DC | PRN

## 2015-10-07 MED ORDER — PHENYLEPHRINE HCL 10 MG/ML IJ SOLN
10.0000 mg | INTRAVENOUS | Status: DC | PRN
  Administered 2015-10-07: 30 ug/min via INTRAVENOUS

## 2015-10-07 MED ORDER — 0.9 % SODIUM CHLORIDE (POUR BTL) OPTIME
TOPICAL | Status: DC | PRN
  Administered 2015-10-07: 1000 mL

## 2015-10-07 MED ORDER — ROCURONIUM BROMIDE 50 MG/5ML IV SOLN
INTRAVENOUS | Status: AC
  Filled 2015-10-07: qty 1

## 2015-10-07 MED ORDER — SUCCINYLCHOLINE CHLORIDE 20 MG/ML IJ SOLN
INTRAMUSCULAR | Status: AC
  Filled 2015-10-07: qty 1

## 2015-10-07 MED ORDER — LIDOCAINE HCL (PF) 1 % IJ SOLN
INTRAMUSCULAR | Status: AC
  Filled 2015-10-07: qty 30

## 2015-10-07 MED ORDER — ONDANSETRON HCL 4 MG/2ML IJ SOLN: 4.0000 mg | Freq: Once | INTRAMUSCULAR | Status: DC | PRN

## 2015-10-07 MED ORDER — ONDANSETRON HCL 4 MG/2ML IJ SOLN
INTRAMUSCULAR | Status: DC | PRN
  Administered 2015-10-07: 4 mg via INTRAVENOUS

## 2015-10-07 MED ORDER — SODIUM CHLORIDE 0.9 % IJ SOLN
INTRAMUSCULAR | Status: AC
  Filled 2015-10-07: qty 10

## 2015-10-07 MED ORDER — LIDOCAINE HCL (CARDIAC) 20 MG/ML IV SOLN
INTRAVENOUS | Status: DC | PRN
  Administered 2015-10-07: 60 mg via INTRAVENOUS

## 2015-10-07 MED ORDER — ONDANSETRON HCL 4 MG/2ML IJ SOLN
INTRAMUSCULAR | Status: AC
  Filled 2015-10-07: qty 2

## 2015-10-07 MED ORDER — MIDAZOLAM HCL 2 MG/2ML IJ SOLN
INTRAMUSCULAR | Status: AC
  Filled 2015-10-07: qty 2

## 2015-10-07 MED ORDER — LIDOCAINE HCL (CARDIAC) 20 MG/ML IV SOLN
INTRAVENOUS | Status: AC
  Filled 2015-10-07: qty 5

## 2015-10-07 MED ORDER — DEXTROSE 5 % IV SOLN
INTRAVENOUS | Status: AC
  Filled 2015-10-07: qty 1.5

## 2015-10-07 MED ORDER — FENTANYL CITRATE (PF) 100 MCG/2ML IJ SOLN
INTRAMUSCULAR | Status: DC | PRN
  Administered 2015-10-07 (×3): 25 ug via INTRAVENOUS
  Administered 2015-10-07 (×2): 50 ug via INTRAVENOUS
  Administered 2015-10-07: 25 ug via INTRAVENOUS
  Administered 2015-10-07: 50 ug via INTRAVENOUS

## 2015-10-07 MED ORDER — HYDROCODONE-ACETAMINOPHEN 7.5-325 MG PO TABS: 1.0000 | ORAL_TABLET | Freq: Once | ORAL | Status: DC | PRN

## 2015-10-07 MED ORDER — FENTANYL CITRATE (PF) 250 MCG/5ML IJ SOLN
INTRAMUSCULAR | Status: AC
  Filled 2015-10-07: qty 5

## 2015-10-07 MED ORDER — PROPOFOL 10 MG/ML IV BOLUS
INTRAVENOUS | Status: DC | PRN
  Administered 2015-10-07: 150 mg via INTRAVENOUS
  Administered 2015-10-07: 50 mg via INTRAVENOUS

## 2015-10-07 MED ORDER — HEPARIN SODIUM (PORCINE) 1000 UNIT/ML IJ SOLN
INTRAMUSCULAR | Status: DC | PRN
  Administered 2015-10-07: 5000 [IU] via INTRAVENOUS

## 2015-10-07 MED ORDER — SODIUM CHLORIDE 0.9 % IV SOLN
INTRAVENOUS | Status: DC | PRN
  Administered 2015-10-07: 500 mL

## 2015-10-07 MED ORDER — OXYCODONE-ACETAMINOPHEN 5-325 MG PO TABS: 1.0000 | ORAL_TABLET | Freq: Four times a day (QID) | ORAL | Status: DC | PRN

## 2015-10-07 MED ORDER — EPHEDRINE SULFATE 50 MG/ML IJ SOLN
INTRAMUSCULAR | Status: AC
  Filled 2015-10-07: qty 1

## 2015-10-07 MED ORDER — PROTAMINE SULFATE 10 MG/ML IV SOLN
INTRAVENOUS | Status: DC | PRN
  Administered 2015-10-07: 20 mg via INTRAVENOUS
  Administered 2015-10-07: 10 mg via INTRAVENOUS
  Administered 2015-10-07: 20 mg via INTRAVENOUS

## 2015-10-07 SURGICAL SUPPLY — 36 items
CANISTER SUCTION 2500CC (MISCELLANEOUS) ×2 IMPLANT
CANNULA VESSEL 3MM 2 BLNT TIP (CANNULA) ×1 IMPLANT
CLIP TI MEDIUM 6 (CLIP) ×2 IMPLANT
CLIP TI WIDE RED SMALL 6 (CLIP) ×2 IMPLANT
DECANTER SPIKE VIAL GLASS SM (MISCELLANEOUS) ×2 IMPLANT
ELECT REM PT RETURN 9FT ADLT (ELECTROSURGICAL) ×2
ELECTRODE REM PT RTRN 9FT ADLT (ELECTROSURGICAL) ×1 IMPLANT
GEL ULTRASOUND 20GR AQUASONIC (MISCELLANEOUS) IMPLANT
GLOVE BIO SURGEON STRL SZ 6.5 (GLOVE) ×2 IMPLANT
GLOVE BIO SURGEON STRL SZ7 (GLOVE) ×1 IMPLANT
GLOVE BIO SURGEON STRL SZ7.5 (GLOVE) ×3 IMPLANT
GLOVE BIOGEL PI IND STRL 6.5 (GLOVE) IMPLANT
GLOVE BIOGEL PI IND STRL 8 (GLOVE) IMPLANT
GLOVE BIOGEL PI INDICATOR 6.5 (GLOVE) ×3
GLOVE BIOGEL PI INDICATOR 8 (GLOVE) ×1
GOWN BRE IMP SLV AUR XL STRL (GOWN DISPOSABLE) ×1 IMPLANT
GOWN STRL REUS W/ TWL LRG LVL3 (GOWN DISPOSABLE) ×3 IMPLANT
GOWN STRL REUS W/TWL LRG LVL3 (GOWN DISPOSABLE) ×8
GRAFT GORETEX STND 6X20 (Vascular Products) ×2 IMPLANT
GRAFT GORETEXSTD 6X20 (Vascular Products) IMPLANT
KIT BASIN OR (CUSTOM PROCEDURE TRAY) ×2 IMPLANT
KIT ROOM TURNOVER OR (KITS) ×2 IMPLANT
LIQUID BAND (GAUZE/BANDAGES/DRESSINGS) ×2 IMPLANT
LOOP VESSEL MINI RED (MISCELLANEOUS) IMPLANT
NDL HYPO 25GX1X1/2 BEV (NEEDLE) ×1 IMPLANT
NEEDLE HYPO 25GX1X1/2 BEV (NEEDLE) ×2 IMPLANT
NS IRRIG 1000ML POUR BTL (IV SOLUTION) ×2 IMPLANT
PACK CV ACCESS (CUSTOM PROCEDURE TRAY) ×2 IMPLANT
PAD ARMBOARD 7.5X6 YLW CONV (MISCELLANEOUS) ×4 IMPLANT
SPONGE SURGIFOAM ABS GEL 100 (HEMOSTASIS) IMPLANT
SUT PROLENE 6 0 CC (SUTURE) ×3 IMPLANT
SUT VIC AB 3-0 SH 27 (SUTURE) ×4
SUT VIC AB 3-0 SH 27X BRD (SUTURE) ×1 IMPLANT
SUT VICRYL 4-0 PS2 18IN ABS (SUTURE) ×3 IMPLANT
UNDERPAD 30X30 INCONTINENT (UNDERPADS AND DIAPERS) ×2 IMPLANT
WATER STERILE IRR 1000ML POUR (IV SOLUTION) ×2 IMPLANT

## 2015-10-07 NOTE — Anesthesia Procedure Notes (Signed)
Procedure Name: Intubation Date/Time: 10/07/2015 7:35 AM Performed by: Salli Quarry Gloriana Piltz Pre-anesthesia Checklist: Patient identified, Emergency Drugs available, Suction available and Patient being monitored Patient Re-evaluated:Patient Re-evaluated prior to inductionOxygen Delivery Method: Circle system utilized Preoxygenation: Pre-oxygenation with 100% oxygen Intubation Type: IV induction Ventilation: Mask ventilation without difficulty LMA: LMA inserted LMA Size: 3.0 Number of attempts: 1 Placement Confirmation: ETT inserted through vocal cords under direct vision,  positive ETCO2 and breath sounds checked- equal and bilateral Tube secured with: Tape Dental Injury: Teeth and Oropharynx as per pre-operative assessment

## 2015-10-07 NOTE — H&P (View-Only) (Signed)
Patient is a 60 year old female referred by Dr. Augustin Coupe for evaluation of AV graft aneurysms. She was seen for similar problem October 2016. At that time the skin was intact and not compromised. The patient has had multiple revisions of her AV graft in 2011, 2012 and replacement of the arterial and venous limbs in 2014. She recently had a shuntogram with angioplasty of the venous outflow. At the time of this she was noted to have 2 aneurysms in her graft one on the arterial side and one on the venous side. She has had no bleeding episodes.  There is no eschar over the aneurysm. She states the skin over one of the aneurysms has become more thinned. She states they have been present for quite some time. She states her graft has been working well on dialysis but she did have angioplasty of the outflow vein proximally 3 weeks ago. Her dialysis today is Monday Wednesday Friday. Chronic medical problems include hypertension and bronchitis both of which are currently stable.  Review of systems: She denies shortness of breath. She denies chest pain.     Past Medical History   Diagnosis  Date   .  Seasonal allergies     .  H/O cardiovascular stress test         Myoview 08/24/12: Inferolateral and anteroseptal areas of scar, no findings of ischemia, EF 77%.   .  Dialysis patient ([place])     .  Complication of anesthesia  ~ 2011       "they gave me a medicine that swolled me and mouth burning up" (08/23/2012)   .  Hypertension         Now controlled by HD   .  History of bronchitis         questions if she has it now bc throat is scratchy and coughing   .  ESRD on dialysis [facility])         M/W/F on [employer]       Past Surgical History   Procedure  Laterality  Date   .  Parathyroidectomy    2012       With implant to left forearm   .  Thyroidectomy, partial    2012       Isthmusectomy    .  Total abdominal hysterectomy    2007   .  Dilation and curettage of uterus    1970's       "when I was pregnant in my  tubes" (08/23/2012)   .  Thrombectomy and revision of arterioventous (av) goretex  graft    2012, 2013   .  Dialysis fistula creation    1992       "left forearm" (08/23/2012)   .  Arteriovenous graft placement    1990's; ~ 2011       "left upper arm; right forearm" (08/23/2012)   .  Ankle fracture surgery    ~ 2009       "right" (08/23/2012)   .  Revision of arteriovenous goretex graft    09/27/2012       Procedure: REVISION OF ARTERIOVENOUS GORETEX GRAFT;  Surgeon: Mal Misty, MD;  Location: [facility] OR;  Service: Vascular;  Laterality: Right;  1) Replacement of venous half of loop with 15mm Gortex graft  2) Excision of erroded pseudoaneurysm of graft with primary closure.   .  Patellectomy    10/03/2012       Procedure: PATELLECTOMY;  Surgeon: Metta Clines Supple,  MD;  Location: [place];  Service: Orthopedics;  Laterality: Right;  RIGHT PARTIAL PATELLECTOMY AND PATELLA TENDON REPAIR   .  Patellar tendon repair    10/03/2012       Procedure: PATELLA TENDON REPAIR;  Surgeon: Marin Shutter, MD;  Location: [place];  Service: Orthopedics;  Laterality: Right;   .  Revision of arteriovenous goretex graft  Right  01/23/2013       Procedure: REVISION OF ARTERIOVENOUS GORETEX GRAFT;  Surgeon: Conrad Hebron, MD;  Location: [place];  Service: Vascular;  Laterality: Right;  Using piece of 17mm x 20cm Gortex graft.     Current Outpatient Prescriptions on File Prior to Visit   Medication  Sig  Dispense  Refill   .  pantoprazole (PROTONIX) 40 MG tablet  Take 1 tablet (40 mg total) by mouth daily.  30 tablet  1   .  predniSONE (DELTASONE) 10 MG tablet  Take 6 tablets (60 mg total) by mouth daily. Take 60 mg 1 day then 50 mg 1 day and then 40 mg 1 day then 30 mg 1 day then 20 mg 1 day then 10 mg 1 day then stop (Patient not taking: Reported on 07/03/2015)  21 tablet  0      No current facility-administered medications on file prior to visit.       Social History      Social History   .  Marital Status:  Married        Spouse Name:  N/A   .  Number of Children:  N/A   .  Years of Education:  N/A      Occupational History   .  Not on file.      Social History Main Topics   .  Smoking status:  Never Smoker    .  Smokeless tobacco:  Never Used   .  Alcohol Use:  No         Comment: quit 2007   .  Drug Use:  No   .  Sexual Activity:  Yes      Other Topics  Concern   .  Not on file      Social History Narrative       Family History   Problem  Relation  Age of Onset   .  Hypertension  Father     .  Thyroid disease  Father     .  Hypertension  Sister     .  Thyroid disease  Sister       Review of systems: She denies shortness of breath. She denies chest pain.  Physical exam:    Filed Vitals:   10/02/15 1138  BP: 116/63  Pulse: 73  Temp: 97.3 F (36.3 C)  TempSrc: Oral  Resp: 20  Height: 5' 2.5" (1.588 m)  Weight: 200 lb 12.8 oz (91.082 kg)   Right upper extremity: Graft aneurysm at the 3:00 position right forearm radial aspect and out scan with small central eschar and 9:00 position no thin skin no eschar audible bruit and graft   Assessment: Degenerative aneurysms right forearm AV graft; the radial limb of the graft has now had thinned out compromised skin at risk for bleeding  Plan: Revision right forearm AV graft June or 17th 2017. Risks benefits possible palpitations and procedure details were discussed with the patient today including not limited to bleeding infection graft thrombosis. She understands and agrees to proceed.   [name], MD [facility] [place] Office: [phone] Pager: [phone]

## 2015-10-07 NOTE — Op Note (Signed)
Procedure: Revision left forearm AV graft  Preoperative diagnosis: Aneurysmal degeneration left forearm AV graft  Postoperative diagnosis: Same  Anesthesia: General  Asst. Silva Bandy PA-C  Operative findings: #1   6 mm PTFE interposition graft left forearm arterial limb radial aspect of forearm  Operative details: After obtaining informed consent, the patient was taken to the operating room. The patient was placed in supine position on the operating room table. After induction of general anesthesia and placement of laryngeal mask the patient's entire left upper extremity was prepped and draped in a sterile fashion. Next a longitudinal incision was made just above and below a graft aneurysm which was on the arterial limb of the graft on the radial aspect of the forearm.  One incision was at the apex of the graft while the other was just below the antecubital crease. Each incision was carried down through the subcutaneous tissues down to level. The graft was dissected free circumferentially. The patient was given 5,000 units of intravenous heparin. The graft was clamped in the proximal incision as well as the distal incision with a fistula clamp. The graft was then divided in the proximal incision. There was some intimal hyperplasia  and degeneration of the graft but there was good arterial inflow. A new 6 mm graft was tunneled subcutaneously lateral to the old graft. This was then sewn end-to-end to the proximal graft using running 6-0 Prolene suture. The interposition graft was cut to length and sewn end-to-end to the venous limb of the graft again with a running 6-0 Prolene suture. Just prior to completion of the anastomosis everything was forebled backbled and thoroughly flushed. Anastomosis was secured, clamps released, and there was flow in the graft immediately. Hemostasis was obtained with thrombin Gelfoam and the assistance of 50 mg of protamine. Subcutaneous tissues of both incisions  reapproximated with a running 3-0 Vicryl suture. Skin of both incisions was closed with 4 0 Vicryl subcuticular stitch. Dermabond was applied incisions. Patient tolerated the procedure well and there were no complications. Instrument, sponge and needle counts were correct at the end of the case. The patient was taken to the recovery room in stable condition.  [name], MD [facility] [place] Office: [phone] Pager: [phone]

## 2015-10-07 NOTE — Interval H&P Note (Signed)
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 After consideration of risks, benefits and other options for treatment, the patient has consented to  Procedure(s): REVISION OF ARTERIOVENOUS GORETEX GRAFT (Right) as a surgical intervention .  The patient's history has been reviewed, patient examined, no change in status, stable for surgery.  I have reviewed the patient's chart and labs.  Questions were answered to the patient's satisfaction.     [name]

## 2015-10-07 NOTE — Anesthesia Postprocedure Evaluation (Signed)
Anesthesia Post Note  Patient: ALEYDA NEWBORN  Procedure(s) Performed: Procedure(s) (LRB): REVISION OF Right arm ARTERIOVENOUS GORETEX GRAFT (Right)  Patient location during evaluation: PACU Anesthesia Type: General Level of consciousness: awake and alert Pain management: pain level controlled Vital Signs Assessment: post-procedure vital signs reviewed and stable Respiratory status: spontaneous breathing, nonlabored ventilation, respiratory function stable and patient connected to nasal cannula oxygen Cardiovascular status: blood pressure returned to baseline and stable Postop Assessment: no signs of nausea or vomiting Anesthetic complications: no    Last Vitals:  Filed Vitals:   10/07/15 1030 10/07/15 1040  BP: 124/66 116/62  Pulse: 78 81  Temp: 36.6 C   Resp: 15 14    Last Pain: There were no vitals filed for this visit.               Zenaida Deed

## 2015-10-07 NOTE — Transfer of Care (Signed)
Immediate Anesthesia Transfer of Care Note  Patient: Victoria [name]  Procedure(s) Performed: Procedure(s): REVISION OF Right arm ARTERIOVENOUS GORETEX GRAFT (Right)  Patient Location: PACU  Anesthesia Type:General  Level of Consciousness: awake, alert , oriented and patient cooperative  Airway & Oxygen Therapy: Patient Spontanous Breathing and Patient connected to nasal cannula oxygen  Post-op Assessment: Report given to RN and Post -op Vital signs reviewed and stable  Post vital signs: Reviewed and stable  Last Vitals:  Filed Vitals:   10/07/15 0607 10/07/15 0920  BP: 86/56 95/54  Pulse: 77 81  Temp: 36.6 C   Resp: 20 16    Complications: No apparent anesthesia complications

## 2015-10-07 NOTE — Anesthesia Preprocedure Evaluation (Addendum)
Anesthesia Evaluation  Patient identified by MRN, date of birth, ID band Patient awake    Reviewed: Allergy & Precautions, H&P , NPO status , Patient's Chart, lab work & pertinent test results  History of Anesthesia Complications (+) history of anesthetic complications  Airway Mallampati: II  TM Distance: >3 FB Neck ROM: full    Dental  (+) Missing, Poor Dentition   Pulmonary neg pulmonary ROS,     + decreased breath sounds      Cardiovascular hypertension, Pt. on medications  Rhythm:Regular Rate:Normal  Myoview 08/24/12: Inferolateral and anteroseptal areas of scar, no findings of ischemia, EF 77%.   Neuro/Psych negative neurological ROS  negative psych ROS   GI/Hepatic   Endo/Other    Renal/GU ESRFRenal disease     Musculoskeletal negative musculoskeletal ROS (+)   Abdominal   Peds  Hematology negative hematology ROS (+)   Anesthesia Other Findings   Reproductive/Obstetrics negative OB ROS                            Anesthesia Physical  Anesthesia Plan  ASA: III  Anesthesia Plan: General   Post-op Pain Management:    Induction: Intravenous  Airway Management Planned: LMA  Additional Equipment:   Intra-op Plan:   Post-operative Plan:   Informed Consent: I have reviewed the patients History and Physical, chart, labs and discussed the procedure including the risks, benefits and alternatives for the proposed anesthesia with the patient or authorized representative who has indicated his/her understanding and acceptance.     Plan Discussed with: CRNA, Anesthesiologist and Surgeon  Anesthesia Plan Comments: (NPO appropriate, labs checked today and wnml, denies cardiac or pulmonary symptoms)       Anesthesia Quick Evaluation

## 2015-10-08 ENCOUNTER — Encounter (HOSPITAL_COMMUNITY): Payer: Self-pay | Admitting: Vascular Surgery

## 2015-10-10 ENCOUNTER — Encounter: Payer: Self-pay | Admitting: Cardiology

## 2015-10-17 ENCOUNTER — Emergency Department (HOSPITAL_COMMUNITY)
Admission: EM | Admit: 2015-10-17 | Discharge: 2015-10-17 | Disposition: A | Discharge status: Home / Self Care | Payer: Medicare Other | Attending: Emergency Medicine | Admitting: Emergency Medicine

## 2015-10-17 ENCOUNTER — Emergency Department (HOSPITAL_COMMUNITY): Payer: Medicare Other

## 2015-10-17 ENCOUNTER — Encounter (HOSPITAL_COMMUNITY): Payer: Self-pay | Admitting: *Deleted

## 2015-10-17 DIAGNOSIS — Z79899 Other long term (current) drug therapy: Secondary | ICD-10-CM | POA: Diagnosis not present

## 2015-10-17 DIAGNOSIS — R109 Unspecified abdominal pain: Secondary | ICD-10-CM | POA: Insufficient documentation

## 2015-10-17 DIAGNOSIS — Z992 Dependence on renal dialysis: Secondary | ICD-10-CM | POA: Diagnosis not present

## 2015-10-17 DIAGNOSIS — I120 Hypertensive chronic kidney disease with stage 5 chronic kidney disease or end stage renal disease: Secondary | ICD-10-CM | POA: Insufficient documentation

## 2015-10-17 DIAGNOSIS — Z7982 Long term (current) use of aspirin: Secondary | ICD-10-CM | POA: Diagnosis not present

## 2015-10-17 DIAGNOSIS — Z90710 Acquired absence of both cervix and uterus: Secondary | ICD-10-CM | POA: Diagnosis not present

## 2015-10-17 DIAGNOSIS — Z9289 Personal history of other medical treatment: Secondary | ICD-10-CM | POA: Insufficient documentation

## 2015-10-17 DIAGNOSIS — N186 End stage renal disease: Secondary | ICD-10-CM | POA: Diagnosis not present

## 2015-10-17 DIAGNOSIS — Z8709 Personal history of other diseases of the respiratory system: Secondary | ICD-10-CM | POA: Diagnosis not present

## 2015-10-17 DIAGNOSIS — R1010 Upper abdominal pain, unspecified: Secondary | ICD-10-CM | POA: Diagnosis present

## 2015-10-17 DIAGNOSIS — R110 Nausea: Secondary | ICD-10-CM | POA: Diagnosis not present

## 2015-10-17 LAB — COMPREHENSIVE METABOLIC PANEL
ALBUMIN: 3.6 g/dL (ref 3.5–5.0)
ALK PHOS: 61 U/L (ref 38–126)
ALT: 18 U/L (ref 14–54)
AST: 31 U/L (ref 15–41)
Anion gap: 14 (ref 5–15)
BUN: 13 mg/dL (ref 6–20)
CHLORIDE: 95 mmol/L — AB (ref 101–111)
CO2: 30 mmol/L (ref 22–32)
CREATININE: 6.72 mg/dL — AB (ref 0.44–1.00)
Calcium: 10.3 mg/dL (ref 8.9–10.3)
GFR calc non Af Amer: 6 mL/min — ABNORMAL LOW (ref >60)
GFR, EST AFRICAN AMERICAN: 7 mL/min — AB (ref >60)
GLUCOSE: 102 mg/dL — AB (ref 65–99)
Potassium: 4.1 mmol/L (ref 3.5–5.1)
SODIUM: 139 mmol/L (ref 135–145)
Total Bilirubin: 0.5 mg/dL (ref 0.3–1.2)
Total Protein: 7.9 g/dL (ref 6.5–8.1)

## 2015-10-17 LAB — CBC
HCT: 52.8 % — ABNORMAL HIGH (ref 36.0–46.0)
Hemoglobin: 17.6 g/dL — ABNORMAL HIGH (ref 12.0–15.0)
MCH: 28.9 pg (ref 26.0–34.0)
MCHC: 33.3 g/dL (ref 30.0–36.0)
MCV: 86.7 fL (ref 78.0–100.0)
PLATELETS: 188 10*3/uL (ref 150–400)
RBC: 6.09 MIL/uL — ABNORMAL HIGH (ref 3.87–5.11)
RDW: 14.8 % (ref 11.5–15.5)
WBC: 11.3 10*3/uL — ABNORMAL HIGH (ref 4.0–10.5)

## 2015-10-17 LAB — LIPASE, BLOOD: LIPASE: 58 U/L — AB (ref 11–51)

## 2015-10-17 MED ORDER — ONDANSETRON 4 MG PO TBDP
4.0000 mg | ORAL_TABLET | Freq: Once | ORAL | Status: AC | PRN
  Administered 2015-10-17: 4 mg via ORAL

## 2015-10-17 MED ORDER — OMEPRAZOLE 20 MG PO CPDR: 20.0000 mg | DELAYED_RELEASE_CAPSULE | Freq: Every day | ORAL | Status: DC

## 2015-10-17 MED ORDER — GI COCKTAIL ~~LOC~~
30.0000 mL | Freq: Once | ORAL | Status: AC
  Administered 2015-10-17: 30 mL via ORAL
  Filled 2015-10-17: qty 30

## 2015-10-17 MED ORDER — ONDANSETRON 4 MG PO TBDP
ORAL_TABLET | ORAL | Status: AC
  Filled 2015-10-17: qty 1

## 2015-10-17 NOTE — Discharge Instructions (Signed)

## 2015-10-17 NOTE — ED Notes (Signed)
Pt reports eating tuna pta and then onset of mid abd pain and cramping and feeling of nausea. Last dialysis tx was today.

## 2015-10-17 NOTE — ED Notes (Signed)
Pt stable, ambulatory, states understanding of discharge instructions 

## 2015-10-17 NOTE — ED Provider Notes (Signed)
CSN: [id number]     Arrival date & time 10/17/15  1413 History   First MD Initiated Contact with Patient 10/17/15 1648     Chief Complaint  Patient presents with  . Abdominal Pain     HPI Patient presents to the emergency Department complaining of upper abdominal discomfort after eating this evening. She reports cramping sensation in her upper abdomen and mild nausea. She is incisional disease and dialysis today. She reports that her pain and discomfort is improving at this time. She denies chest pain or shortness of breath. She denies back pain. She reports no vomiting. Denies diarrhea. Reports no fever or chills. Pain was moderate in severity and now is mild.   Past Medical History  Diagnosis Date  . Seasonal allergies   . H/O cardiovascular stress test     Myoview 08/24/12: Inferolateral and anteroseptal areas of scar, no findings of ischemia, EF 77%.  . Dialysis patient ([place])   . Complication of anesthesia ~ 2011    "they gave me a medicine that swolled me and mouth burning up" (08/23/2012)  . Hypertension     Now controlled by HD  . History of bronchitis     questions if she has it now bc throat is scratchy and coughing  . ESRD on dialysis [facility])     M/W/F on [employer]   Past Surgical History  Procedure Laterality Date  . Parathyroidectomy  2012    With implant to left forearm  . Thyroidectomy, partial  2012    Isthmusectomy   . Total abdominal hysterectomy  2007  . Dilation and curettage of uterus  1970's    "when I was pregnant in my tubes" (08/23/2012)  . Thrombectomy and revision of arterioventous (av) goretex  graft  2012, 2013  . Dialysis fistula creation  1992    "left forearm" (08/23/2012)  . Arteriovenous graft placement  1990's; ~ 2011    "left upper arm; right forearm" (08/23/2012)  . Ankle fracture surgery  ~ 2009    "right" (08/23/2012)  . Revision of arteriovenous goretex graft  09/27/2012    Procedure: REVISION OF ARTERIOVENOUS GORETEX GRAFT;  Surgeon: Mal Misty, MD;  Location: [facility] OR;  Service: Vascular;  Laterality: Right;  1) Replacement of venous half of loop with 43mm Gortex graft  2) Excision of erroded pseudoaneurysm of graft with primary closure.  . Patellectomy  10/03/2012    Procedure: PATELLECTOMY;  Surgeon: Marin Shutter, MD;  Location: [place];  Service: Orthopedics;  Laterality: Right;  RIGHT PARTIAL PATELLECTOMY AND PATELLA TENDON REPAIR  . Patellar tendon repair  10/03/2012    Procedure: PATELLA TENDON REPAIR;  Surgeon: Marin Shutter, MD;  Location: [place];  Service: Orthopedics;  Laterality: Right;  . Revision of arteriovenous goretex graft Right 01/23/2013    Procedure: REVISION OF ARTERIOVENOUS GORETEX GRAFT;  Surgeon: Conrad Coldwater, MD;  Location: [place];  Service: Vascular;  Laterality: Right;  Using piece of 32mm x 20cm Gortex graft.   . Revision of arteriovenous goretex graft Right 10/07/2015    Procedure: REVISION OF Right arm ARTERIOVENOUS GORETEX GRAFT;  Surgeon: Elam Dutch, MD;  Location: [facility] OR;  Service: Vascular;  Laterality: Right;   Family History  Problem Relation Age of Onset  . Hypertension Father   . Thyroid disease Father   . Hypertension Sister   . Thyroid disease Sister    Social History  Substance Use Topics  . Smoking status: Never Smoker   .  Smokeless tobacco: Never Used  . Alcohol Use: No     Comment: quit 2007   OB History    No data available     Review of Systems  All other systems reviewed and are negative.     Allergies  Lisinopril and Norvasc  Home Medications   Prior to Admission medications   Medication Sig Start Date End Date Taking? Authorizing Provider  aspirin 81 MG tablet Take 81 mg by mouth daily. Reported on 10/02/2015   Yes Historical Provider, MD  calcium acetate (PHOSLO) 667 MG capsule Take 667 mg by mouth 3 (three) times daily with meals.   Yes Historical Provider, MD  omeprazole (PRILOSEC) 20 MG capsule Take 1 capsule (20 mg total) by mouth daily. 10/17/15   Jola Schmidt, MD   BP 102/72 mmHg  Pulse 81  Temp(Src) 97.7 F (36.5 C) (Oral)  Resp 16  Ht 5\' 3"  (1.6 m)  Wt 170 lb (77.111 kg)  BMI 30.12 kg/m2  SpO2 98% Physical Exam  Constitutional: She is oriented to person, place, and time. She appears well-developed and well-nourished. No distress.  HENT:  Head: Normocephalic and atraumatic.  Eyes: EOM are normal.  Neck: Normal range of motion.  Cardiovascular: Normal rate, regular rhythm and normal heart sounds.   Pulmonary/Chest: Effort normal and breath sounds normal.  Abdominal: Soft. She exhibits no distension. There is no tenderness.  Musculoskeletal: Normal range of motion.  Neurological: She is alert and oriented to person, place, and time.  Skin: Skin is warm and dry.  Psychiatric: She has a normal mood and affect. Judgment normal.  Nursing note and vitals reviewed.   ED Course  Procedures (including critical care time) Labs Review Labs Reviewed  LIPASE, BLOOD - Abnormal; Notable for the following:    Lipase 58 (*)    All other components within normal limits  COMPREHENSIVE METABOLIC PANEL - Abnormal; Notable for the following:    Chloride 95 (*)    Glucose, Bld 102 (*)    Creatinine, Ser 6.72 (*)    GFR calc non Af Amer 6 (*)    GFR calc Af Amer 7 (*)    All other components within normal limits  CBC - Abnormal; Notable for the following:    WBC 11.3 (*)    RBC 6.09 (*)    Hemoglobin 17.6 (*)    HCT 52.8 (*)    All other components within normal limits  URINALYSIS, ROUTINE W REFLEX MICROSCOPIC (NOT AT [facility])    Imaging Review Dg Abd Acute W/chest  10/17/2015  CLINICAL DATA:  Nausea and feeling of a knot in the stomach after eating tuna at 1330 hours today. EXAM: DG ABDOMEN ACUTE W/ 1V CHEST COMPARISON:  CT abdomen and pelvis 12/31/2014.  Chest 06/26/2014 FINDINGS: Elevation of left hemidiaphragm. Normal heart size and pulmonary vascularity. No focal airspace disease or consolidation in the lungs. Calcification of the  aorta. Surgical clips in the base of the neck. Mildly dilated gas-filled small bowel loops in the mid abdomen with evidence of fold thickening. Gas and stool throughout the colon without colonic distention. The no acute air-fluid levels. No free intra-abdominal air. Findings could be due to ileus or enteritis. Calcifications in the pelvis likely phleboliths. Vascular calcifications. Visualized bones appear intact. IMPRESSION: Mildly dilated gas distended small bowel with evidence of fold thickening. Nondistended colon filled with gas and stool. Findings likely to represent ileus or enteritis. Electronically Signed   By: Lucienne Capers M.D.   On:  10/17/2015 18:14   I have personally reviewed and evaluated these images and lab results as part of my medical decision-making.   EKG Interpretation None      MDM   Final diagnoses:  Abdominal pain, unspecified abdominal location    Patient is overall well-appearing. Labs and imaging without significant abnormality. Baseline creatinine. May represent ileus or gastritis   Jola Schmidt, MD 10/17/15 1921

## 2015-10-22 ENCOUNTER — Emergency Department (HOSPITAL_COMMUNITY): Payer: Medicare Other

## 2015-10-22 ENCOUNTER — Encounter (HOSPITAL_COMMUNITY): Payer: Self-pay | Admitting: Emergency Medicine

## 2015-10-22 ENCOUNTER — Emergency Department (HOSPITAL_COMMUNITY)
Admission: EM | Admit: 2015-10-22 | Discharge: 2015-10-22 | Disposition: A | Discharge status: Home / Self Care | Payer: Medicare Other | Attending: Emergency Medicine | Admitting: Emergency Medicine

## 2015-10-22 DIAGNOSIS — Y9389 Activity, other specified: Secondary | ICD-10-CM | POA: Diagnosis not present

## 2015-10-22 DIAGNOSIS — S8992XA Unspecified injury of left lower leg, initial encounter: Secondary | ICD-10-CM | POA: Diagnosis present

## 2015-10-22 DIAGNOSIS — I120 Hypertensive chronic kidney disease with stage 5 chronic kidney disease or end stage renal disease: Secondary | ICD-10-CM | POA: Insufficient documentation

## 2015-10-22 DIAGNOSIS — S82002A Unspecified fracture of left patella, initial encounter for closed fracture: Secondary | ICD-10-CM

## 2015-10-22 DIAGNOSIS — Z8709 Personal history of other diseases of the respiratory system: Secondary | ICD-10-CM | POA: Insufficient documentation

## 2015-10-22 DIAGNOSIS — Y9289 Other specified places as the place of occurrence of the external cause: Secondary | ICD-10-CM | POA: Insufficient documentation

## 2015-10-22 DIAGNOSIS — W010XXA Fall on same level from slipping, tripping and stumbling without subsequent striking against object, initial encounter: Secondary | ICD-10-CM | POA: Insufficient documentation

## 2015-10-22 DIAGNOSIS — Y998 Other external cause status: Secondary | ICD-10-CM | POA: Diagnosis not present

## 2015-10-22 DIAGNOSIS — S82032A Displaced transverse fracture of left patella, initial encounter for closed fracture: Secondary | ICD-10-CM | POA: Diagnosis not present

## 2015-10-22 DIAGNOSIS — Z79899 Other long term (current) drug therapy: Secondary | ICD-10-CM | POA: Diagnosis not present

## 2015-10-22 DIAGNOSIS — N186 End stage renal disease: Secondary | ICD-10-CM | POA: Diagnosis not present

## 2015-10-22 DIAGNOSIS — Z7982 Long term (current) use of aspirin: Secondary | ICD-10-CM | POA: Diagnosis not present

## 2015-10-22 DIAGNOSIS — Z992 Dependence on renal dialysis: Secondary | ICD-10-CM | POA: Diagnosis not present

## 2015-10-22 MED ORDER — HYDROCODONE-ACETAMINOPHEN 5-325 MG PO TABS: 1.0000 | ORAL_TABLET | Freq: Four times a day (QID) | ORAL | Status: DC | PRN

## 2015-10-22 MED ORDER — OXYCODONE-ACETAMINOPHEN 5-325 MG PO TABS
1.0000 | ORAL_TABLET | Freq: Once | ORAL | Status: AC
  Administered 2015-10-22: 1 via ORAL
  Filled 2015-10-22: qty 1

## 2015-10-22 NOTE — Progress Notes (Signed)
Orthopedic Tech Progress Note Patient Details:  Victoria [name] 12-Apr-1956 [id number]  Ortho Devices Type of Ortho Device: Knee Immobilizer Ortho Device/Splint Location: lle Ortho Device/Splint Interventions: Application   [name] 10/22/2015, 3:22 PM

## 2015-10-22 NOTE — ED Notes (Signed)
Pt is in stable condition upon d/c and ambulates from ED. 

## 2015-10-22 NOTE — Discharge Instructions (Signed)
Patellar Fracture, Adult A patellar fracture is a break in your kneecap (patella).  CAUSES   A direct blow to the knee or a fall is usually the cause of a broken patella.  A very hard and strong bending of your knee can cause a patellar fracture. RISK FACTORS Involvement in contact sports, especially sports that involve a lot of jumping. SIGNS AND SYMPTOMS   Tender and swollen knee.  Pain when you move your knee, especially when you try to straighten out your leg.  Difficulty walking or putting weight on your knee.  Misshapen knee (as if a bone is out of place). DIAGNOSIS  Patellar fracture is usually diagnosed with a physical exam and an X-ray exam. TREATMENT  Treatment depends on the type of fracture:  If your patella is still in the right position after the fracture and you can still straighten your leg out, you can usually be treated with a splint or cast for 4-6 weeks.  If your patella is broken into multiple small pieces but you are able to straighten your leg, you can usually be treated with a splint or cast for 4-6 weeks. Sometimes your patella may need to be removed before the cast is applied.  If you cannot straighten out your leg after a patellar fracture, then surgery is required to hold the bony fragments together until they heal. A cast or splint will be applied for 4-6 weeks. HOME CARE INSTRUCTIONS   Only take over-the-counter or prescription medicines for pain, discomfort, or fever as directed by your health care provider.  Use crutches as directed, and exercise the leg as directed.  Apply ice to the injured area:  Put ice in a plastic bag.  Place a towel between your skin and the bag.  Leave the ice on for 20 minutes, 2-3 times a day.  Elevate the affected knee above the level of your heart. SEEK MEDICAL CARE IF:  You suspect you have significantly injured your knee.  You hear a pop after a knee injury.  Your knee is misshapen after a knee  injury.  You have pain when you move your knee.  You have difficulty walking or putting weight on your knee.  You cannot fully move your knee. SEEK IMMEDIATE MEDICAL CARE IF:  You have redness, swelling, or increasing pain in your knee.  You have a fever.   This information is not intended to replace advice given to you by your health care provider. Make sure you discuss any questions you have with your health care provider.   Document Released: 06/05/2003 Document Revised: 06/27/2013 Document Reviewed: 04/18/2013 Elsevier Interactive Patient Education [employer].

## 2015-10-22 NOTE — ED Provider Notes (Signed)
CSN: [id number]     Arrival date & time 10/22/15  1311 History   First MD Initiated Contact with Patient 10/22/15 1322     Chief Complaint  Patient presents with  . Fall  . Knee Pain     HPI Patient presents to the emergency room with complaints of left knee pain. Patient was at [facility] when she tripped and fell. She landed on her left knee and is now having significant pain. It hurts to stand or bear any weight. She denies any trouble with headache injury. She denies any loss of consciousness. She denies any pain elsewhere. Patient was brought in by ambulance.  Past Medical History  Diagnosis Date  . Seasonal allergies   . H/O cardiovascular stress test     Myoview 08/24/12: Inferolateral and anteroseptal areas of scar, no findings of ischemia, EF 77%.  . Dialysis patient ([place])   . Complication of anesthesia ~ 2011    "they gave me a medicine that swolled me and mouth burning up" (08/23/2012)  . Hypertension     Now controlled by HD  . History of bronchitis     questions if she has it now bc throat is scratchy and coughing  . ESRD on dialysis [facility])     M/W/F on [employer]   Past Surgical History  Procedure Laterality Date  . Parathyroidectomy  2012    With implant to left forearm  . Thyroidectomy, partial  2012    Isthmusectomy   . Total abdominal hysterectomy  2007  . Dilation and curettage of uterus  1970's    "when I was pregnant in my tubes" (08/23/2012)  . Thrombectomy and revision of arterioventous (av) goretex  graft  2012, 2013  . Dialysis fistula creation  1992    "left forearm" (08/23/2012)  . Arteriovenous graft placement  1990's; ~ 2011    "left upper arm; right forearm" (08/23/2012)  . Ankle fracture surgery  ~ 2009    "right" (08/23/2012)  . Revision of arteriovenous goretex graft  09/27/2012    Procedure: REVISION OF ARTERIOVENOUS GORETEX GRAFT;  Surgeon: Mal Misty, MD;  Location: [facility] OR;  Service: Vascular;  Laterality: Right;  1) Replacement of venous half  of loop with 52mm Gortex graft  2) Excision of erroded pseudoaneurysm of graft with primary closure.  . Patellectomy  10/03/2012    Procedure: PATELLECTOMY;  Surgeon: Marin Shutter, MD;  Location: [place];  Service: Orthopedics;  Laterality: Right;  RIGHT PARTIAL PATELLECTOMY AND PATELLA TENDON REPAIR  . Patellar tendon repair  10/03/2012    Procedure: PATELLA TENDON REPAIR;  Surgeon: Marin Shutter, MD;  Location: [place];  Service: Orthopedics;  Laterality: Right;  . Revision of arteriovenous goretex graft Right 01/23/2013    Procedure: REVISION OF ARTERIOVENOUS GORETEX GRAFT;  Surgeon: Conrad Dewey Beach, MD;  Location: [place];  Service: Vascular;  Laterality: Right;  Using piece of 60mm x 20cm Gortex graft.   . Revision of arteriovenous goretex graft Right 10/07/2015    Procedure: REVISION OF Right arm ARTERIOVENOUS GORETEX GRAFT;  Surgeon: Elam Dutch, MD;  Location: [facility] OR;  Service: Vascular;  Laterality: Right;   Family History  Problem Relation Age of Onset  . Hypertension Father   . Thyroid disease Father   . Hypertension Sister   . Thyroid disease Sister    Social History  Substance Use Topics  . Smoking status: Never Smoker   . Smokeless tobacco: Never Used  . Alcohol Use: No  Comment: quit 2007   OB History    No data available     Review of Systems  All other systems reviewed and are negative.     Allergies  Lisinopril and Norvasc  Home Medications   Prior to Admission medications   Medication Sig Start Date End Date Taking? Authorizing Provider  aspirin 81 MG tablet Take 81 mg by mouth daily. Reported on 10/02/2015   Yes Historical Provider, MD  calcium acetate (PHOSLO) 667 MG capsule Take 667 mg by mouth 3 (three) times daily with meals.   Yes Historical Provider, MD  omeprazole (PRILOSEC) 20 MG capsule Take 1 capsule (20 mg total) by mouth daily. 10/17/15  Yes Jola Schmidt, MD  HYDROcodone-acetaminophen (NORCO/VICODIN) 5-325 MG tablet Take 1 tablet by mouth every 6  (six) hours as needed. 10/22/15   Dorie Rank, MD   BP 120/77 mmHg  Pulse 93  Temp(Src) 98.5 F (36.9 C) (Oral)  Resp 18  SpO2 95% Physical Exam  Constitutional: She appears well-developed and well-nourished. No distress.  HENT:  Head: Normocephalic and atraumatic.  Right Ear: External ear normal.  Left Ear: External ear normal.  Eyes: Conjunctivae are normal. Right eye exhibits no discharge. Left eye exhibits no discharge. No scleral icterus.  Neck: Neck supple. No tracheal deviation present.  Cardiovascular: Normal rate.   Pulmonary/Chest: Effort normal. No stridor. No respiratory distress.  Musculoskeletal: She exhibits edema and tenderness.       Left hip: Normal.       Left knee: She exhibits swelling. Tenderness found.       Left ankle: Normal.  Neurological: She is alert. Cranial nerve deficit: no gross deficits.  Skin: Skin is warm and dry. No rash noted.  Psychiatric: She has a normal mood and affect.  Nursing note and vitals reviewed.   ED Course  Procedures (including critical care time) Labs Review Labs Reviewed - No data to display  Imaging Review Dg Knee Complete 4 Views Left  10/22/2015  CLINICAL DATA:  [place] [address] [facility] left knee pain EXAM: LEFT KNEE - COMPLETE 4+ VIEW COMPARISON:  None. FINDINGS: Four views of the left knee submitted. There is transverse fracture inferior aspect of patella with complete separation of bony fragments. There is superior retraction of larger patellar fragment. Significant prepatellar soft tissue swelling. Small joint effusion. IMPRESSION: There is transverse fracture inferior aspect of patella with complete separation of bony fragments. There is superior retraction of larger patellar fragment. Significant prepatellar soft tissue swelling. Small joint effusion. Electronically Signed   By: Lahoma Crocker M.D.   On: 10/22/2015 13:56   I have personally reviewed and evaluated these images and lab results as part of my medical  decision-making.    MDM   Final diagnoses:  Patella fracture, left, closed, initial encounter    Patient's x-rays demonstrate a patellar fracture. There is significant displacement of the superior portion of the patella fragment.  Patient is unable to raise her left  Lower leg due to this injury.  She will likely require operative repair.  Pt unfortunately fx her right patella in the past and had surgery.  She is familiar with the process.  She saw [place] orth[facility] previously.  She will call to schedule an appointment.  Dc home with knee immobilizer and crutches   Dorie Rank, MD 10/22/15 1526

## 2015-10-22 NOTE — ED Notes (Signed)
Pt here from [employer]- pt experienced a mechanical fall and landed on her left knee. Edema and crepitus noted to left knee. Pt denies dizziness, LOC. No other pain.

## 2015-10-24 ENCOUNTER — Encounter (HOSPITAL_COMMUNITY): Payer: Self-pay | Admitting: Emergency Medicine

## 2015-10-24 ENCOUNTER — Emergency Department (HOSPITAL_COMMUNITY)
Admission: EM | Admit: 2015-10-24 | Discharge: 2015-10-24 | Disposition: A | Discharge status: Home / Self Care | Payer: Medicare Other | Attending: Emergency Medicine | Admitting: Emergency Medicine

## 2015-10-24 DIAGNOSIS — I120 Hypertensive chronic kidney disease with stage 5 chronic kidney disease or end stage renal disease: Secondary | ICD-10-CM | POA: Diagnosis not present

## 2015-10-24 DIAGNOSIS — M25562 Pain in left knee: Secondary | ICD-10-CM | POA: Insufficient documentation

## 2015-10-24 DIAGNOSIS — N186 End stage renal disease: Secondary | ICD-10-CM | POA: Diagnosis not present

## 2015-10-24 DIAGNOSIS — Z79899 Other long term (current) drug therapy: Secondary | ICD-10-CM | POA: Diagnosis not present

## 2015-10-24 DIAGNOSIS — Z7982 Long term (current) use of aspirin: Secondary | ICD-10-CM | POA: Diagnosis not present

## 2015-10-24 DIAGNOSIS — Z992 Dependence on renal dialysis: Secondary | ICD-10-CM | POA: Insufficient documentation

## 2015-10-24 MED ORDER — OXYCODONE-ACETAMINOPHEN 5-325 MG PO TABS: 1.0000 | ORAL_TABLET | ORAL | Status: DC | PRN

## 2015-10-24 MED ORDER — DICLOFENAC SODIUM 1 % TD GEL: 2.0000 g | Freq: Four times a day (QID) | TRANSDERMAL | Status: DC

## 2015-10-24 MED ORDER — OXYCODONE-ACETAMINOPHEN 5-325 MG PO TABS
2.0000 | ORAL_TABLET | Freq: Once | ORAL | Status: AC
  Administered 2015-10-24: 2 via ORAL
  Filled 2015-10-24: qty 2

## 2015-10-24 MED ORDER — IBUPROFEN 800 MG PO TABS: 800.0000 mg | ORAL_TABLET | Freq: Once | ORAL | Status: DC

## 2015-10-24 NOTE — ED Notes (Signed)
Placed ice on pt's knee

## 2015-10-24 NOTE — ED Provider Notes (Signed)
CSN: [id number]     Arrival date & time 10/24/15  0620 History   First MD Initiated Contact with Patient 10/24/15 [phone]     Chief Complaint  Patient presents with  . Knee Pain     (Consider location/radiation/quality/duration/timing/severity/associated sxs/prior Treatment) HPI   Victoria [name] is a 60 y.o. female, with a history of left patellar fracture and end-stage renal disease on dialysis, presenting to the ED with pain to her left knee for the last three days. Pt has a known left patellar fracture. Pt rates her pain at 10/10, throbbing, nonradiating. Pt states the Vicodin she was given is not controlling her pain. Pt states, "I'm supposed to have surgery on Monday, but I can't wait that long. I need surgery now." Pt has not taken anything else for her pain. Pt has only tried taking the Vicodin once a day and has only had two total doses over the last three days. Pt states she has been using her crutches to get around and has been keeping it elevated most of the day. Pt denies numbness, tingling, weakness, or any other complaints.    Past Medical History  Diagnosis Date  . Seasonal allergies   . H/O cardiovascular stress test     Myoview 08/24/12: Inferolateral and anteroseptal areas of scar, no findings of ischemia, EF 77%.  . Dialysis patient ([place])   . Complication of anesthesia ~ 2011    "they gave me a medicine that swolled me and mouth burning up" (08/23/2012)  . Hypertension     Now controlled by HD  . History of bronchitis     questions if she has it now bc throat is scratchy and coughing  . ESRD on dialysis [facility])     M/W/F on [employer]   Past Surgical History  Procedure Laterality Date  . Parathyroidectomy  2012    With implant to left forearm  . Thyroidectomy, partial  2012    Isthmusectomy   . Total abdominal hysterectomy  2007  . Dilation and curettage of uterus  1970's    "when I was pregnant in my tubes" (08/23/2012)  . Thrombectomy and revision of  arterioventous (av) goretex  graft  2012, 2013  . Dialysis fistula creation  1992    "left forearm" (08/23/2012)  . Arteriovenous graft placement  1990's; ~ 2011    "left upper arm; right forearm" (08/23/2012)  . Ankle fracture surgery  ~ 2009    "right" (08/23/2012)  . Revision of arteriovenous goretex graft  09/27/2012    Procedure: REVISION OF ARTERIOVENOUS GORETEX GRAFT;  Surgeon: Mal Misty, MD;  Location: [facility] OR;  Service: Vascular;  Laterality: Right;  1) Replacement of venous half of loop with 26mm Gortex graft  2) Excision of erroded pseudoaneurysm of graft with primary closure.  . Patellectomy  10/03/2012    Procedure: PATELLECTOMY;  Surgeon: Marin Shutter, MD;  Location: [place];  Service: Orthopedics;  Laterality: Right;  RIGHT PARTIAL PATELLECTOMY AND PATELLA TENDON REPAIR  . Patellar tendon repair  10/03/2012    Procedure: PATELLA TENDON REPAIR;  Surgeon: Marin Shutter, MD;  Location: [place];  Service: Orthopedics;  Laterality: Right;  . Revision of arteriovenous goretex graft Right 01/23/2013    Procedure: REVISION OF ARTERIOVENOUS GORETEX GRAFT;  Surgeon: Conrad Ethan, MD;  Location: [place];  Service: Vascular;  Laterality: Right;  Using piece of 28mm x 20cm Gortex graft.   . Revision of arteriovenous goretex graft Right 10/07/2015  Procedure: REVISION OF Right arm ARTERIOVENOUS GORETEX GRAFT;  Surgeon: Elam Dutch, MD;  Location: [facility] OR;  Service: Vascular;  Laterality: Right;   Family History  Problem Relation Age of Onset  . Hypertension Father   . Thyroid disease Father   . Hypertension Sister   . Thyroid disease Sister    Social History  Substance Use Topics  . Smoking status: Never Smoker   . Smokeless tobacco: Never Used  . Alcohol Use: No     Comment: quit 2007   OB History    No data available     Review of Systems  Constitutional: Negative for fever, chills and diaphoresis.  Musculoskeletal: Positive for arthralgias (Left knee pain). Negative for myalgias  and back pain.  Neurological: Negative for dizziness, weakness, light-headedness and numbness.      Allergies  Lisinopril and Norvasc  Home Medications   Prior to Admission medications   Medication Sig Start Date End Date Taking? Authorizing Provider  aspirin 81 MG tablet Take 81 mg by mouth daily. Reported on 10/02/2015    Historical Provider, MD  calcium acetate (PHOSLO) 667 MG capsule Take 667 mg by mouth 3 (three) times daily with meals.    Historical Provider, MD  diclofenac sodium (VOLTAREN) 1 % GEL Apply 2 g topically 4 (four) times daily. 10/24/15   Alahia Whicker C Wilbon Obenchain, PA-C  HYDROcodone-acetaminophen (NORCO/VICODIN) 5-325 MG tablet Take 1 tablet by mouth every 6 (six) hours as needed. 10/22/15   Dorie Rank, MD  omeprazole (PRILOSEC) 20 MG capsule Take 1 capsule (20 mg total) by mouth daily. 10/17/15   Jola Schmidt, MD  oxyCODONE-acetaminophen (PERCOCET/ROXICET) 5-325 MG tablet Take 1-2 tablets by mouth every 4 (four) hours as needed for severe pain. 10/24/15   Oluwatomisin Deman C Pachia Strum, PA-C   BP 109/79 mmHg  Pulse 84  Temp(Src) 98 F (36.7 C) (Oral)  Resp 16  SpO2 97% Physical Exam  Constitutional: She appears well-developed and well-nourished. No distress.  HENT:  Head: Normocephalic and atraumatic.  Eyes: Conjunctivae are normal. Pupils are equal, round, and reactive to light.  Cardiovascular: Normal rate, regular rhythm and intact distal pulses.   Pulmonary/Chest: Effort normal.  Musculoskeletal:  Swelling and tenderness noted to the anterior left patellar region. Knee immobilizer is in place upon this physical exam. Range of motion distal to the injury is intact.  Neurological: She is alert.  No sensory deficits. Strength 5 out of 5 distal to the injury.  Skin: Skin is warm and dry. She is not diaphoretic.  Nursing note and vitals reviewed.   ED Course  Procedures (including critical care time)  Imaging Review Dg Knee Complete 4 Views Left  10/22/2015  CLINICAL DATA:  [place] [address] [facility]  left knee pain EXAM: LEFT KNEE - COMPLETE 4+ VIEW COMPARISON:  None. FINDINGS: Four views of the left knee submitted. There is transverse fracture inferior aspect of patella with complete separation of bony fragments. There is superior retraction of larger patellar fragment. Significant prepatellar soft tissue swelling. Small joint effusion. IMPRESSION: There is transverse fracture inferior aspect of patella with complete separation of bony fragments. There is superior retraction of larger patellar fragment. Significant prepatellar soft tissue swelling. Small joint effusion. Electronically Signed   By: Lahoma Crocker M.D.   On: 10/22/2015 13:56   I have personally reviewed and evaluated these images as part of my medical decision-making.   EKG Interpretation None      MDM   Final diagnoses:  Left knee pain  MOLLEE AGUAS presents with left knee pain for the last 3 days with a history of known patellar fracture.  Patient was seen on February 1 for the initial evaluation of her knee pain following a fall. Patient was diagnosed with patellar fracture and instructed to follow-up with [facility] orthopedics. Patient was placed in a knee immobilizer, and given Vicodin and crutches. Patient has not been taking her pain medication as prescribed. Counseling was given advising the patient to follow the directions for administration of the pain medication.  Patient to be trialed with a dose of Percocet here in the ED and should this improve her pain discharged with a new prescription and specific counseling on the correct way to take these medications. Patient was advised that the decision for surgery is up to the orthopedic surgeons and that she would have to take her concerns to them. I do not see any emergent conditions which would require orthopedic consult here in the ED. Patient mentions that she is due for her dialysis today and requested admission and dialysis upstairs. This does not seem appropriate  at this time, especially since the patient has no renally related complaints. Patient was advised that she would need to go to her regularly scheduled appointment for outpatient dialysis. Patient improved with conservative management here in the ED. The patient was given instructions for home care as well as return precautions. Patient voices understanding of these instructions, accepts the plan, and is comfortable with discharge.  Lorayne Bender, PA-C 10/24/15 0740  Everlene Balls, MD 10/24/15 [phone]

## 2015-10-24 NOTE — Discharge Instructions (Signed)
You have been seen today for knee pain. Will need to follow-up with orthopedics on this matter. Call the orthopedics office to tell them your concerns. Follow up with PCP as needed. Return to ED should symptoms worsen.

## 2015-10-24 NOTE — ED Notes (Signed)
Pt in reporting L knee pain. Pt seen here on 2/1 after mechanical fall at [employer] and fracturing patella. Pt reporting inc pain, states she has NOT taken her meds since yesterday at 1pm because they "do not work"

## 2015-10-27 ENCOUNTER — Encounter (HOSPITAL_BASED_OUTPATIENT_CLINIC_OR_DEPARTMENT_OTHER): Payer: Self-pay | Admitting: *Deleted

## 2015-10-28 ENCOUNTER — Ambulatory Visit: Payer: Self-pay | Admitting: Orthopedic Surgery

## 2015-10-30 ENCOUNTER — Encounter (HOSPITAL_BASED_OUTPATIENT_CLINIC_OR_DEPARTMENT_OTHER): Payer: Self-pay | Admitting: *Deleted

## 2015-10-30 NOTE — Progress Notes (Signed)
NPO AFTER MN.  ARRIVE AT 0600.  NEEDS ISTAT 8.  CURRENT EKG IN CHART AND EPIC.  WILL TAKE PRILOSEC AND IF NEEDED TAKE PERCOCET AM DOS W/ SIPS OF WATER.  HEMODIALYSIS PT, CURRENT AV FISTULA RIGHT FOREARM. PT HAD DIALYSIS TODAY INSTEAD OF TOMORROW. PER EPIC  PREVIOUS IV 'S HAVE BEEN STARTED ON LEFT HAND.

## 2015-10-31 ENCOUNTER — Ambulatory Visit (HOSPITAL_BASED_OUTPATIENT_CLINIC_OR_DEPARTMENT_OTHER): Payer: Medicare Other | Admitting: Anesthesiology

## 2015-10-31 ENCOUNTER — Encounter (HOSPITAL_BASED_OUTPATIENT_CLINIC_OR_DEPARTMENT_OTHER): Payer: Self-pay | Admitting: Anesthesiology

## 2015-10-31 ENCOUNTER — Ambulatory Visit (HOSPITAL_BASED_OUTPATIENT_CLINIC_OR_DEPARTMENT_OTHER)
Admission: RE | Admit: 2015-10-31 | Discharge: 2015-10-31 | Disposition: A | Discharge status: Home / Self Care | Payer: Medicare Other | Source: Ambulatory Visit | Attending: Orthopedic Surgery | Admitting: Orthopedic Surgery

## 2015-10-31 ENCOUNTER — Encounter (HOSPITAL_BASED_OUTPATIENT_CLINIC_OR_DEPARTMENT_OTHER)
Admission: RE | Disposition: A | Discharge status: Home / Self Care | Payer: Self-pay | Source: Ambulatory Visit | Attending: Orthopedic Surgery

## 2015-10-31 DIAGNOSIS — K219 Gastro-esophageal reflux disease without esophagitis: Secondary | ICD-10-CM | POA: Diagnosis not present

## 2015-10-31 DIAGNOSIS — N186 End stage renal disease: Secondary | ICD-10-CM | POA: Diagnosis not present

## 2015-10-31 DIAGNOSIS — Z90710 Acquired absence of both cervix and uterus: Secondary | ICD-10-CM | POA: Insufficient documentation

## 2015-10-31 DIAGNOSIS — Z992 Dependence on renal dialysis: Secondary | ICD-10-CM | POA: Insufficient documentation

## 2015-10-31 DIAGNOSIS — S82042A Displaced comminuted fracture of left patella, initial encounter for closed fracture: Secondary | ICD-10-CM | POA: Insufficient documentation

## 2015-10-31 DIAGNOSIS — W010XXA Fall on same level from slipping, tripping and stumbling without subsequent striking against object, initial encounter: Secondary | ICD-10-CM | POA: Diagnosis not present

## 2015-10-31 DIAGNOSIS — I120 Hypertensive chronic kidney disease with stage 5 chronic kidney disease or end stage renal disease: Secondary | ICD-10-CM | POA: Diagnosis not present

## 2015-10-31 DIAGNOSIS — Y92511 Restaurant or cafe as the place of occurrence of the external cause: Secondary | ICD-10-CM | POA: Insufficient documentation

## 2015-10-31 DIAGNOSIS — Z79899 Other long term (current) drug therapy: Secondary | ICD-10-CM | POA: Diagnosis not present

## 2015-10-31 DIAGNOSIS — Z7982 Long term (current) use of aspirin: Secondary | ICD-10-CM | POA: Diagnosis not present

## 2015-10-31 HISTORY — DX: Secondary hyperparathyroidism of renal origin: N25.81

## 2015-10-31 HISTORY — DX: Unspecified fracture of left patella, initial encounter for closed fracture: S82.002A

## 2015-10-31 HISTORY — DX: Personal history of other diseases of the respiratory system: Z87.09

## 2015-10-31 HISTORY — DX: Presence of spectacles and contact lenses: Z97.3

## 2015-10-31 HISTORY — PX: ORIF PATELLA: SHX5033

## 2015-10-31 LAB — POCT I-STAT, CHEM 8
BUN: 37 mg/dL — ABNORMAL HIGH (ref 6–20)
CALCIUM ION: 1.1 mmol/L — AB (ref 1.13–1.30)
CHLORIDE: 96 mmol/L — AB (ref 101–111)
CREATININE: 8.5 mg/dL — AB (ref 0.44–1.00)
Glucose, Bld: 75 mg/dL (ref 65–99)
HCT: 45 % (ref 36.0–46.0)
Hemoglobin: 15.3 g/dL — ABNORMAL HIGH (ref 12.0–15.0)
Potassium: 4.3 mmol/L (ref 3.5–5.1)
Sodium: 139 mmol/L (ref 135–145)
TCO2: 31 mmol/L (ref 0–100)

## 2015-10-31 SURGERY — OPEN REDUCTION INTERNAL FIXATION (ORIF) PATELLA
Anesthesia: General | Site: Knee | Laterality: Left

## 2015-10-31 MED ORDER — MIDAZOLAM HCL 2 MG/2ML IJ SOLN
1.0000 mg | Freq: Once | INTRAMUSCULAR | Status: AC
  Administered 2015-10-31: 1 mg via INTRAVENOUS
  Filled 2015-10-31: qty 1

## 2015-10-31 MED ORDER — ONDANSETRON HCL 4 MG/2ML IJ SOLN
4.0000 mg | Freq: Once | INTRAMUSCULAR | Status: DC | PRN
  Filled 2015-10-31: qty 2

## 2015-10-31 MED ORDER — OXYCODONE HCL 5 MG PO TABS: 5.0000 mg | ORAL_TABLET | ORAL | Status: DC | PRN

## 2015-10-31 MED ORDER — METHOCARBAMOL 500 MG PO TABS
500.0000 mg | ORAL_TABLET | Freq: Four times a day (QID) | ORAL | Status: DC | PRN
  Filled 2015-10-31: qty 1

## 2015-10-31 MED ORDER — ONDANSETRON HCL 4 MG/2ML IJ SOLN
INTRAMUSCULAR | Status: DC | PRN
  Administered 2015-10-31: 4 mg via INTRAVENOUS

## 2015-10-31 MED ORDER — ONDANSETRON HCL 4 MG PO TABS
4.0000 mg | ORAL_TABLET | Freq: Four times a day (QID) | ORAL | Status: DC | PRN
  Filled 2015-10-31: qty 1

## 2015-10-31 MED ORDER — OXYCODONE HCL 5 MG PO TABS
5.0000 mg | ORAL_TABLET | ORAL | Status: DC | PRN
  Filled 2015-10-31: qty 2

## 2015-10-31 MED ORDER — BUPIVACAINE-EPINEPHRINE (PF) 0.5% -1:200000 IJ SOLN
INTRAMUSCULAR | Status: AC
  Filled 2015-10-31: qty 30

## 2015-10-31 MED ORDER — SUCCINYLCHOLINE CHLORIDE 20 MG/ML IJ SOLN
INTRAMUSCULAR | Status: DC | PRN
  Administered 2015-10-31: 100 mg via INTRAVENOUS

## 2015-10-31 MED ORDER — SODIUM CHLORIDE 0.9 % IR SOLN
Status: DC | PRN
  Administered 2015-10-31: 500 mL

## 2015-10-31 MED ORDER — LIDOCAINE HCL (CARDIAC) 20 MG/ML IV SOLN
INTRAVENOUS | Status: AC
  Filled 2015-10-31: qty 5

## 2015-10-31 MED ORDER — FENTANYL CITRATE (PF) 100 MCG/2ML IJ SOLN
25.0000 ug | INTRAMUSCULAR | Status: DC | PRN
  Filled 2015-10-31: qty 1

## 2015-10-31 MED ORDER — CEFAZOLIN SODIUM-DEXTROSE 2-3 GM-% IV SOLR
INTRAVENOUS | Status: AC
  Filled 2015-10-31: qty 50

## 2015-10-31 MED ORDER — CEFADROXIL 500 MG PO CAPS: 500.0000 mg | ORAL_CAPSULE | Freq: Two times a day (BID) | ORAL | Status: DC

## 2015-10-31 MED ORDER — MIDAZOLAM HCL 5 MG/5ML IJ SOLN
INTRAMUSCULAR | Status: DC | PRN
  Administered 2015-10-31: 1 mg via INTRAVENOUS

## 2015-10-31 MED ORDER — GLYCOPYRROLATE 0.2 MG/ML IJ SOLN
INTRAMUSCULAR | Status: DC | PRN
  Administered 2015-10-31: 0.4 mg via INTRAVENOUS

## 2015-10-31 MED ORDER — ROCURONIUM BROMIDE 100 MG/10ML IV SOLN
INTRAVENOUS | Status: DC | PRN
  Administered 2015-10-31: 30 mg via INTRAVENOUS

## 2015-10-31 MED ORDER — DEXAMETHASONE SODIUM PHOSPHATE 4 MG/ML IJ SOLN
INTRAMUSCULAR | Status: DC | PRN
  Administered 2015-10-31: 10 mg via INTRAVENOUS

## 2015-10-31 MED ORDER — ASPIRIN 81 MG PO TABS: 81.0000 mg | ORAL_TABLET | Freq: Two times a day (BID) | ORAL | Status: DC

## 2015-10-31 MED ORDER — PROPOFOL 10 MG/ML IV BOLUS
INTRAVENOUS | Status: AC
  Filled 2015-10-31: qty 20

## 2015-10-31 MED ORDER — ONDANSETRON HCL 4 MG/2ML IJ SOLN
4.0000 mg | Freq: Four times a day (QID) | INTRAMUSCULAR | Status: DC | PRN
  Filled 2015-10-31: qty 2

## 2015-10-31 MED ORDER — SODIUM CHLORIDE 0.9 % IV SOLN
INTRAVENOUS | Status: DC
  Filled 2015-10-31: qty 1000

## 2015-10-31 MED ORDER — ALBUMIN HUMAN 5 % IV SOLN
INTRAVENOUS | Status: DC | PRN
  Administered 2015-10-31 (×2): via INTRAVENOUS

## 2015-10-31 MED ORDER — PHENYLEPHRINE HCL 10 MG/ML IJ SOLN
INTRAMUSCULAR | Status: DC | PRN
  Administered 2015-10-31 (×4): 40 ug via INTRAVENOUS

## 2015-10-31 MED ORDER — SENNA 8.6 MG PO TABS: 2.0000 | ORAL_TABLET | Freq: Every day | ORAL | Status: DC

## 2015-10-31 MED ORDER — CEFAZOLIN SODIUM-DEXTROSE 2-3 GM-% IV SOLR
2.0000 g | INTRAVENOUS | Status: AC
  Administered 2015-10-31: 2 g via INTRAVENOUS
  Filled 2015-10-31: qty 50

## 2015-10-31 MED ORDER — METHOCARBAMOL 1000 MG/10ML IJ SOLN
500.0000 mg | Freq: Four times a day (QID) | INTRAVENOUS | Status: DC | PRN
  Filled 2015-10-31: qty 5

## 2015-10-31 MED ORDER — ALBUMIN HUMAN 5 % IV SOLN
INTRAVENOUS | Status: AC
Start: 2015-10-31 — End: 2015-10-31
  Filled 2015-10-31: qty 500

## 2015-10-31 MED ORDER — MIDAZOLAM HCL 2 MG/2ML IJ SOLN
INTRAMUSCULAR | Status: AC
  Filled 2015-10-31: qty 2

## 2015-10-31 MED ORDER — FENTANYL CITRATE (PF) 100 MCG/2ML IJ SOLN
INTRAMUSCULAR | Status: AC
  Filled 2015-10-31: qty 2

## 2015-10-31 MED ORDER — METOCLOPRAMIDE HCL 5 MG/ML IJ SOLN
5.0000 mg | Freq: Three times a day (TID) | INTRAMUSCULAR | Status: DC | PRN
  Filled 2015-10-31: qty 2

## 2015-10-31 MED ORDER — CHLORHEXIDINE GLUCONATE 4 % EX LIQD
60.0000 mL | Freq: Once | CUTANEOUS | Status: DC
  Filled 2015-10-31: qty 60

## 2015-10-31 MED ORDER — ONDANSETRON HCL 4 MG/2ML IJ SOLN
INTRAMUSCULAR | Status: AC
  Filled 2015-10-31: qty 2

## 2015-10-31 MED ORDER — ONDANSETRON HCL 4 MG PO TABS: 4.0000 mg | ORAL_TABLET | Freq: Three times a day (TID) | ORAL | Status: DC | PRN

## 2015-10-31 MED ORDER — FENTANYL CITRATE (PF) 100 MCG/2ML IJ SOLN
INTRAMUSCULAR | Status: DC | PRN
  Administered 2015-10-31 (×3): 50 ug via INTRAVENOUS

## 2015-10-31 MED ORDER — NEOSTIGMINE METHYLSULFATE 10 MG/10ML IV SOLN
INTRAVENOUS | Status: DC | PRN
  Administered 2015-10-31: 3 mg via INTRAVENOUS

## 2015-10-31 MED ORDER — FENTANYL CITRATE (PF) 100 MCG/2ML IJ SOLN
50.0000 ug | Freq: Once | INTRAMUSCULAR | Status: AC
  Administered 2015-10-31: 50 ug via INTRAVENOUS
  Filled 2015-10-31: qty 1

## 2015-10-31 MED ORDER — PROPOFOL 10 MG/ML IV BOLUS
INTRAVENOUS | Status: DC | PRN
  Administered 2015-10-31: 100 mg via INTRAVENOUS

## 2015-10-31 MED ORDER — METOCLOPRAMIDE HCL 5 MG PO TABS
5.0000 mg | ORAL_TABLET | Freq: Three times a day (TID) | ORAL | Status: DC | PRN
  Filled 2015-10-31: qty 2

## 2015-10-31 MED ORDER — SODIUM CHLORIDE 0.9 % IV SOLN
INTRAVENOUS | Status: DC
  Administered 2015-10-31 (×2): via INTRAVENOUS
  Filled 2015-10-31: qty 1000

## 2015-10-31 MED ORDER — BUPIVACAINE-EPINEPHRINE 0.5% -1:200000 IJ SOLN
INTRAMUSCULAR | Status: AC
  Filled 2015-10-31: qty 1

## 2015-10-31 MED ORDER — BUPIVACAINE-EPINEPHRINE (PF) 0.5% -1:200000 IJ SOLN
INTRAMUSCULAR | Status: DC | PRN
  Administered 2015-10-31: 30 mL via PERINEURAL

## 2015-10-31 MED ORDER — ROCURONIUM BROMIDE 100 MG/10ML IV SOLN
INTRAVENOUS | Status: AC
  Filled 2015-10-31: qty 1

## 2015-10-31 SURGICAL SUPPLY — 75 items
BANDAGE ACE 6X5 VEL STRL LF (GAUZE/BANDAGES/DRESSINGS) ×4 IMPLANT
BANDAGE ESMARK 6X9 LF (GAUZE/BANDAGES/DRESSINGS) ×1 IMPLANT
BIOMET ×1 IMPLANT
BLADE SURG 15 STRL LF DISP TIS (BLADE) IMPLANT
BLADE SURG 15 STRL SS (BLADE) ×3
BNDG CMPR 9X6 STRL LF SNTH (GAUZE/BANDAGES/DRESSINGS)
BNDG COHESIVE 6X5 TAN NS LF (GAUZE/BANDAGES/DRESSINGS) ×5 IMPLANT
BNDG ESMARK 6X9 LF (GAUZE/BANDAGES/DRESSINGS)
BNDG GAUZE ELAST 4 BULKY (GAUZE/BANDAGES/DRESSINGS) ×3 IMPLANT
CHLORAPREP W/TINT 26ML (MISCELLANEOUS) ×3 IMPLANT
CONVATEC ×1 IMPLANT
COVER LIGHT HANDLE  1/PK (MISCELLANEOUS) ×2
COVER LIGHT HANDLE 1/PK (MISCELLANEOUS) IMPLANT
COVIDIEN ×1 IMPLANT
CUFF TOURN SGL QUICK 34 (TOURNIQUET CUFF) ×3
CUFF TOURN SGL QUICK 44 (TOURNIQUET CUFF) IMPLANT
CUFF TRNQT CYL 34X4X40X1 (TOURNIQUET CUFF) IMPLANT
DECANTER SPIKE VIAL GLASS SM (MISCELLANEOUS) ×3 IMPLANT
DRAPE C-ARM 42X72 X-RAY (DRAPES) ×2 IMPLANT
DRAPE EXTREMITY T 121X128X90 (DRAPE) ×1 IMPLANT
DRAPE INCISE IOBAN 66X45 STRL (DRAPES) ×1 IMPLANT
DRAPE U-SHAPE 47X51 STRL (DRAPES) ×3 IMPLANT
DRSG ADAPTIC 3X8 NADH LF (GAUZE/BANDAGES/DRESSINGS) ×1 IMPLANT
DRSG AQUACEL AG ADV 3.5X10 (GAUZE/BANDAGES/DRESSINGS) ×2 IMPLANT
DRSG PAD ABDOMINAL 8X10 ST (GAUZE/BANDAGES/DRESSINGS) ×3 IMPLANT
ELECT REM PT RETURN 9FT ADLT (ELECTROSURGICAL) ×3
ELECTRODE REM PT RTRN 9FT ADLT (ELECTROSURGICAL) ×1 IMPLANT
GAUZE SPONGE 4X4 12PLY STRL (GAUZE/BANDAGES/DRESSINGS) ×3 IMPLANT
GLOVE BIO SURGEON STRL SZ 6.5 (GLOVE) ×2 IMPLANT
GLOVE BIO SURGEON STRL SZ8.5 (GLOVE) ×9 IMPLANT
GLOVE BIO SURGEONS STRL SZ 6.5 (GLOVE) ×2
GLOVE BIOGEL PI IND STRL 6.5 (GLOVE) IMPLANT
GLOVE BIOGEL PI IND STRL 7.0 (GLOVE) IMPLANT
GLOVE BIOGEL PI IND STRL 7.5 (GLOVE) IMPLANT
GLOVE BIOGEL PI IND STRL 8.5 (GLOVE) ×1 IMPLANT
GLOVE BIOGEL PI INDICATOR 6.5 (GLOVE) ×2
GLOVE BIOGEL PI INDICATOR 7.0 (GLOVE) ×2
GLOVE BIOGEL PI INDICATOR 7.5 (GLOVE) ×2
GLOVE BIOGEL PI INDICATOR 8.5 (GLOVE) ×2
GLOVE ECLIPSE 8.0 STRL XLNG CF (GLOVE) IMPLANT
GLOVE INDICATOR 8.5 STRL (GLOVE) ×2 IMPLANT
GLOVE ORTHO TXT STRL SZ7.5 (GLOVE) ×8 IMPLANT
GLOVE SURG ORTHO 8.0 STRL STRW (GLOVE) ×1 IMPLANT
GLOVE SURG SS PI 7.0 STRL IVOR (GLOVE) ×2 IMPLANT
GOWN SPEC L3 XXLG W/TWL (GOWN DISPOSABLE) ×6 IMPLANT
GOWN STRL REUS W/TWL LRG LVL3 (GOWN DISPOSABLE) ×7 IMPLANT
GOWN STRL REUS W/TWL XL LVL3 (GOWN DISPOSABLE) ×2 IMPLANT
MANIFOLD NEPTUNE II (INSTRUMENTS) ×1 IMPLANT
MARKER SKIN DUAL TIP RULER LAB (MISCELLANEOUS) ×2 IMPLANT
NS IRRIG 500ML POUR BTL (IV SOLUTION) ×3 IMPLANT
PACK TOTAL JOINT (CUSTOM PROCEDURE TRAY) ×3 IMPLANT
PAD CAST 4YDX4 CTTN HI CHSV (CAST SUPPLIES) ×1 IMPLANT
PADDING CAST COTTON 4X4 STRL (CAST SUPPLIES)
PASSER SUT SWANSON 36MM LOOP (INSTRUMENTS) ×3 IMPLANT
PRESOURCE ×2 IMPLANT
PROCARE ×1 IMPLANT
RETRIEVER SUT HEWSON (MISCELLANEOUS) ×2 IMPLANT
SMITH & NEPHEW ×1 IMPLANT
SPONGE LAP 18X18 X RAY DECT (DISPOSABLE) IMPLANT
SPONGE LAP 4X18 X RAY DECT (DISPOSABLE) IMPLANT
STAPLER VISISTAT 35W (STAPLE) ×3 IMPLANT
SUT ETHIBOND NAB CT1 #1 30IN (SUTURE) ×6 IMPLANT
SUT FIBERWIRE #2 38 T-5 BLUE (SUTURE) ×6
SUT MON AB 2-0 CT1 36 (SUTURE) ×2 IMPLANT
SUT VIC AB 0 CT1 36 (SUTURE) ×6 IMPLANT
SUT VIC AB 1 CT1 27 (SUTURE) ×6
SUT VIC AB 1 CT1 27XBRD ANTBC (SUTURE) ×2 IMPLANT
SUT VIC AB 1 CT1 36 (SUTURE) ×10 IMPLANT
SUT VIC AB 2-0 CT1 (SUTURE) ×4 IMPLANT
SUT VIC AB 2-0 CT1 27 (SUTURE) ×6
SUT VIC AB 2-0 CT1 TAPERPNT 27 (SUTURE) ×2 IMPLANT
SUTURE FIBERWR #2 38 T-5 BLUE (SUTURE) ×2 IMPLANT
TOWEL OR 17X26 10 PK STRL BLUE (TOWEL DISPOSABLE) ×6 IMPLANT
WATER STERILE IRR 500ML POUR (IV SOLUTION) ×1 IMPLANT
YANKAUER SUCT BULB TIP NO VENT (SUCTIONS) ×2 IMPLANT

## 2015-10-31 NOTE — Anesthesia Procedure Notes (Addendum)
Anesthesia Regional Block:  Femoral nerve block  Pre-Anesthetic Checklist: ,, timeout performed, Correct Patient, Correct Site, Correct Laterality, Correct Procedure, Correct Position, site marked, Risks and benefits discussed,  Surgical consent,  Pre-op evaluation,  At surgeon's request and post-op pain management  Laterality: Left  Prep: chloraprep       Needles:  Injection technique: Single-shot  Needle Type: Echogenic Needle     Needle Length: 10cm 10 cm Needle Gauge: 21 and 21 G    Additional Needles:  Procedures: ultrasound guided (picture in chart) Femoral nerve block Narrative:  Start time: 10/31/2015 7:25 AM End time: 10/31/2015 7:26 AM Injection made incrementally with aspirations every 5 mL.  Performed by: Personally  Anesthesiologist: Catalina Gravel  Additional Notes: No pain on injection. No increased resistance to injection. Injection made in 5cc increments.  Good needle visualization.  Patient tolerated procedure well.   Procedure Name: Intubation Date/Time: 10/31/2015 7:41 AM Performed by: Justice Rocher Pre-anesthesia Checklist: Patient identified, Emergency Drugs available, Suction available and Patient being monitored Patient Re-evaluated:Patient Re-evaluated prior to inductionOxygen Delivery Method: Circle System Utilized Preoxygenation: Pre-oxygenation with 100% oxygen Intubation Type: IV induction Ventilation: Mask ventilation without difficulty Laryngoscope Size: Mac and 3 Grade View: Grade II Tube type: Oral Tube size: 7.0 mm Number of attempts: 1 Airway Equipment and Method: Stylet and Oral airway Placement Confirmation: ETT inserted through vocal cords under direct vision,  positive ETCO2 and breath sounds checked- equal and bilateral Secured at: 24 cm Tube secured with: Tape Dental Injury: Teeth and Oropharynx as per pre-operative assessment  Comments: Discussed with patient in pre-op ( prior to IV sedation ) about her poor dental  condition. Upper right tooth appears decayed and dark, discolored, and missing teeth. Dental risk explained with airway mangagement and pt accepted.

## 2015-10-31 NOTE — Anesthesia Preprocedure Evaluation (Addendum)
Anesthesia Evaluation  Patient identified by MRN, date of birth, ID band Patient awake    Reviewed: Allergy & Precautions, NPO status , Patient's Chart, lab work & pertinent test results  History of Anesthesia Complications (+) history of anesthetic complications ([id number]: "they gave me a medicine that swolled me and mouth burning up" )  Airway Mallampati: II  TM Distance: >3 FB Neck ROM: Full    Dental  (+) Dental Advisory Given, Missing, Poor Dentition   Pulmonary neg pulmonary ROS,    Pulmonary exam normal breath sounds clear to auscultation       Cardiovascular hypertension, (-) angina(-) Past MI Normal cardiovascular exam Rhythm:Regular Rate:Normal     Neuro/Psych negative neurological ROS  negative psych ROS   GI/Hepatic Neg liver ROS, GERD  Medicated,  Endo/Other  Secondary hyperparathyroidism s/p total parathyroidectomy in 2014  Obesity   Renal/GU Dialysis and ESRFRenal diseaseLast dialysis 10/30/15     Musculoskeletal negative musculoskeletal ROS (+)   Abdominal   Peds  Hematology negative hematology ROS (+)   Anesthesia Other Findings Day of surgery medications reviewed with the patient.  Reproductive/Obstetrics                         Anesthesia Physical Anesthesia Plan  ASA: IV  Anesthesia Plan: General   Post-op Pain Management: GA combined w/ Regional for post-op pain   Induction: Intravenous  Airway Management Planned: Oral ETT  Additional Equipment:   Intra-op Plan:   Post-operative Plan: Extubation in OR  Informed Consent: I have reviewed the patients History and Physical, chart, labs and discussed the procedure including the risks, benefits and alternatives for the proposed anesthesia with the patient or authorized representative who has indicated his/her understanding and acceptance.   Dental advisory given  Plan Discussed with: CRNA  Anesthesia Plan Comments:  (Risks/benefits of general anesthesia discussed with patient including risk of damage to teeth, lips, gum, and tongue, nausea/vomiting, allergic reactions to medications, and the possibility of heart attack, stroke and death.  All patient questions answered.  Patient wishes to proceed.  GA plus femoral nerve block (left))      Anesthesia Quick Evaluation

## 2015-10-31 NOTE — Brief Op Note (Signed)
10/31/2015  9:40 AM  PATIENT:  Victoria [name]  60 y.o. female  PRE-OPERATIVE DIAGNOSIS:  left patella fracture  POST-OPERATIVE DIAGNOSIS:  left patella fracture  PROCEDURE:  Procedure(s): OPEN REDUCTION INTERNAL (ORIF) FIXATION LEFT PATELLA (Left)  SURGEON:  Surgeon(s) and Role:    * Rod Can, MD - Primary  PHYSICIAN ASSISTANT: none  ASSISTANTS: none   ANESTHESIA:   regional and general  EBL:  Total I/O In: 900 [I.V.:400; IV Piggyback:500] Out: 50 [Blood:50]  BLOOD ADMINISTERED:none  DRAINS: none   LOCAL MEDICATIONS USED:  NONE  SPECIMEN:  No Specimen  DISPOSITION OF SPECIMEN:  N/A  COUNTS:  YES  TOURNIQUET:   Total Tourniquet Time Documented: Thigh (Left) - 73 minutes Total: Thigh (Left) - 73 minutes   DICTATION: .Other Dictation: Dictation Number [id number]  PLAN OF CARE: Discharge to home after PACU  PATIENT DISPOSITION:  PACU - hemodynamically stable.   Delay start of Pharmacological VTE agent (>24hrs) due to surgical blood loss or risk of bleeding: no

## 2015-10-31 NOTE — Progress Notes (Signed)
Patient waiting for walker to arrive.

## 2015-10-31 NOTE — Transfer of Care (Signed)
Immediate Anesthesia Transfer of Care Note  Patient: Victoria [name]  Procedure(s) Performed: Procedure(s) (LRB): OPEN REDUCTION INTERNAL (ORIF) FIXATION LEFT PATELLA (Left)  Patient Location: PACU  Anesthesia Type: General  Level of Consciousness: awake, sedated, patient cooperative and responds to stimulation  Airway & Oxygen Therapy: Patient Spontanous Breathing and Patient connected to face mask oxygen  Post-op Assessment: Report given to PACU RN, Post -op Vital signs reviewed and stable and Patient moving all extremities  Post vital signs: Reviewed and stable  Complications: No apparent anesthesia complications

## 2015-10-31 NOTE — Anesthesia Postprocedure Evaluation (Signed)
Anesthesia Post Note  Patient: Victoria [name]  Procedure(s) Performed: Procedure(s) (LRB): OPEN REDUCTION INTERNAL (ORIF) FIXATION LEFT PATELLA (Left)  Patient location during evaluation: PACU Anesthesia Type: General and Regional Level of consciousness: awake and alert Pain management: pain level controlled Vital Signs Assessment: post-procedure vital signs reviewed and stable Respiratory status: spontaneous breathing, nonlabored ventilation, respiratory function stable and patient connected to nasal cannula oxygen Cardiovascular status: blood pressure returned to baseline and stable Postop Assessment: no signs of nausea or vomiting Anesthetic complications: no Comments: Good pain control with femoral nerve block    Last Vitals:  Filed Vitals:   10/31/15 1216 10/31/15 1230  BP:  126/73  Pulse: 93 98  Temp:    Resp: 20 20    Last Pain:  Filed Vitals:   10/31/15 1259  PainSc: 10-Worst pain ever                 Catalina Gravel

## 2015-10-31 NOTE — Discharge Instructions (Signed)
Knee immobilizer at all times. Do not bend your knee. You may weight bear as tolerated in the knee brace. Use a walker. In 4 days, you may remove the Ace wrap and white cotton padding. Then replace knee immobilizer. Do not remove the tan dressing over the incision.  Call your surgeon if you experience:   1.  Fever over 101.0. 2.  Inability to urinate. 3.  Nausea and/or vomiting. 4.  Extreme swelling or bruising at the surgical site. 5.  Continued bleeding from the incision. 6.  Increased pain, redness or drainage from the incision. 7.  Problems related to your pain medication. 8. Any change in color, movement and/or sensation 9. Any problems and/or concernsRegional Anesthesia Blocks  1. Numbness or the inability to move the "blocked" extremity may last from 3-48 hours after placement. The length of time depends on the medication injected and your individual response to the medication. If the numbness is not going away after 48 hours, call your surgeon.  2. The extremity that is blocked will need to be protected until the numbness is gone and the  Strength has returned. Because you cannot feel it, you will need to take extra care to avoid injury. Because it may be weak, you may have difficulty moving it or using it. You may not know what position it is in without looking at it while the block is in effect.  3. For blocks in the legs and feet, returning to weight bearing and walking needs to be done carefully. You will need to wait until the numbness is entirely gone and the strength has returned. You should be able to move your leg and foot normally before you try and bear weight or walk. You will need someone to be with you when you first try to ensure you do not fall and possibly risk injury.  4. Bruising and tenderness at the needle site are common side effects and will resolve in a few days.  5. Persistent numbness or new problems with movement should be communicated to the surgeon or the  [facility] [phone] [place] [phone]).

## 2015-10-31 NOTE — H&P (Signed)
PREOPERATIVE H&P  Chief Complaint: left patella fracture  HPI: Victoria [name] is a 60 y.o. female who presents for preoperative history and physical with a diagnosis of left patella fracture. She has an acute disruption of her extensor mechanism.  She has elected for surgical management.   Past Medical History  Diagnosis Date  . Seasonal allergies   . Secondary hyperparathyroidism, renal ([place])     s/p  total parathyroidectomy  2014  . Complication of anesthesia ~ 2011    "they gave me a medicine that swolled me and mouth burning up" (08/23/2012)  . Hypertension   . History of acute pulmonary edema     2003  . ESRD on hemodialysis [facility]) Nephrologist-- dr deterding    ESRD due to HTN---  dialysis  M/W/F   on [address]  . Left patella fracture   . Wears glasses    Past Surgical History  Procedure Laterality Date  . Dialysis fistula creation  1992    left upper arm ---  w/  Multiple Revision's until 2006  . Revision of arteriovenous goretex graft  09/27/2012    Procedure: REVISION OF ARTERIOVENOUS GORETEX GRAFT;  Surgeon: Mal Misty, MD;  Location: [facility] OR;  Service: Vascular;  Laterality: Right;  1) Replacement of venous half of loop with 67mm Gortex graft  2) Excision of erroded pseudoaneurysm of graft with primary closure.  . Patellectomy  10/03/2012    Procedure: PATELLECTOMY;  Surgeon: Marin Shutter, MD;  Location: [place];  Service: Orthopedics;  Laterality: Right;  RIGHT PARTIAL PATELLECTOMY AND PATELLA TENDON REPAIR  . Patellar tendon repair  10/03/2012    Procedure: PATELLA TENDON REPAIR;  Surgeon: Marin Shutter, MD;  Location: [place];  Service: Orthopedics;  Laterality: Right;  . Revision of arteriovenous goretex graft Right 01/23/2013    Procedure: REVISION OF ARTERIOVENOUS GORETEX GRAFT;  Surgeon: Conrad Wanblee, MD;  Location: [place];  Service: Vascular;  Laterality: Right;  Using piece of 51mm x 20cm Gortex graft.   . Revision of arteriovenous goretex graft Right 10/07/2015   Procedure: REVISION OF Right arm ARTERIOVENOUS GORETEX GRAFT;  Surgeon: Elam Dutch, MD;  Location: [place];  Service: Vascular;  Laterality: Right;  . Total parathyroidectomy/  thyroid isthmusectomy/  autotransplantation parathyroid tissue to left brachioriadialis muscle  02-18-2011  . Orif left ankle fx  07-31-2008  . Total abdominal hysterectomy  03-05-2005    w/  Right Ovarian Cystectomy  . Ectopic pregnancy surgery  1970's  . Arteriovenous graft placement  03-25-2005    Right forearm  w/  multiple Revision's   . Transthoracic echocardiogram  10-31-2012    mild LVH,  grade 1 diastolic dysfunction,  ef 55-65%/  mild MR/  trivial TR  . Cardiovascular stress test  08-24-2012    abnormal nuclear study/  inferolateral and anteroseptal areas of scar,  no ischemia/  normal LV function and wall motion, ef 77%   Social History   Social History  . Marital Status: Married    Spouse Name: N/A  . Number of Children: N/A  . Years of Education: N/A   Social History Main Topics  . Smoking status: Never Smoker   . Smokeless tobacco: Never Used  . Alcohol Use: No     Comment: quit 2007  . Drug Use: No  . Sexual Activity: Not Asked   Other Topics Concern  . None   Social History Narrative   Family History  Problem Relation Age of Onset  .  Hypertension Father   . Thyroid disease Father   . Hypertension Sister   . Thyroid disease Sister    Allergies  Allergen Reactions  . Lisinopril Swelling and Other (See Comments)    "made my chest hurt"  . Norvasc [Amlodipine Besylate] Swelling   Prior to Admission medications   Medication Sig Start Date End Date Taking? Authorizing Provider  calcium acetate (PHOSLO) 667 MG capsule Take 667 mg by mouth 3 (three) times daily with meals.   Yes Historical Provider, MD  diclofenac sodium (VOLTAREN) 1 % GEL Apply 2 g topically 4 (four) times daily. 10/24/15  Yes Shawn C Joy, PA-C  omeprazole (PRILOSEC) 20 MG capsule Take 1 capsule (20 mg total) by  mouth daily. 10/17/15  Yes Jola Schmidt, MD  oxyCODONE-acetaminophen (PERCOCET/ROXICET) 5-325 MG tablet Take 1-2 tablets by mouth every 4 (four) hours as needed for severe pain. 10/24/15  Yes Shawn C Joy, PA-C  aspirin 81 MG tablet Take 81 mg by mouth daily. Reported on 10/02/2015    Historical Provider, MD  HYDROcodone-acetaminophen (NORCO/VICODIN) 5-325 MG tablet Take 1 tablet by mouth every 6 (six) hours as needed. 10/22/15   Dorie Rank, MD     Positive ROS: All other systems have been reviewed and were otherwise negative with the exception of those mentioned in the HPI and as above.  Physical Exam: General: Alert, no acute distress Cardiovascular: No pedal edema Respiratory: No cyanosis, no use of accessory musculature GI: No organomegaly, abdomen is soft and non-tender Skin: No lesions in the area of chief complaint Neurologic: Sensation intact distally Psychiatric: Patient is competent for consent with normal mood and affect Lymphatic: No axillary or cervical lymphadenopathy  MUSCULOSKELETAL: L knee swollen. Small blister over patella. Unable to straight  Leg raise. 2+ DP. SILT. +TA/GS/EHL.  Assessment: left patella fracture  Plan: Plan for Procedure(s): OPEN REDUCTION INTERNAL (ORIF) FIXATION LEFT PATELLA  The risks benefits and alternatives were discussed with the patient including but not limited to the risks of nonoperative treatment, versus surgical intervention including infection, bleeding, malunion, nonunion, stiffness, nerve injury, blood clots, cardiopulmonary complications, morbidity, mortality, among others, and they were willing to proceed.   Valiant Dills, Horald Pollen, MD Cell [phone]   10/31/2015 7:16 AM

## 2015-11-03 ENCOUNTER — Encounter (HOSPITAL_BASED_OUTPATIENT_CLINIC_OR_DEPARTMENT_OTHER): Payer: Self-pay | Admitting: Orthopedic Surgery

## 2015-11-03 NOTE — Op Note (Signed)
NAMEARANDA, DIBBLE                ACCOUNT NO.:  [id number]  MEDICAL RECORD NO.:  [id number]  LOCATION:                                 FACILITY:  PHYSICIAN:  Rod Can, MD     DATE OF BIRTH:  1956-04-13  DATE OF PROCEDURE:  10/31/2015 DATE OF DISCHARGE:                              OPERATIVE REPORT   SURGEON:  Rod Can, MD.  ASSISTANT:  None.  PROCEDURE PERFORMED:  Partial patellectomy left knee with repair of patellar tendon.  PREOPERATIVE DIAGNOSIS:  Comminuted left patella fracture.  POSTOPERATIVE DIAGNOSIS:  Comminuted left patella fracture.  ANESTHESIA:  General plus regional block.  EBL:  50 mL.  COMPLICATIONS:  None.  TUBES AND DRAINS:  None.  DISPOSITION:  Stable to PACU.  ANTIBIOTICS:  2 g of Ancef.  INDICATIONS:  The patient is a 60 year old female, who has end-stage renal disease, on hemodialysis Monday, Wednesday, Friday who slipped on a wet floor, [employer] on October 22, 2015.  She landed directly on her left knee.  She had knee pain and inability to weight bear.  She was seen in the emergency department and found to have a comminuted patella fracture.  She was placed in a knee immobilizer.  Given information for orthopedic followup.  I saw her in the office, and she was indicated for operative treatment of her left patella fracture.  Risks, benefits, and alternatives were explained and she elected to proceed.  DESCRIPTION OF PROCEDURE IN DETAIL:  I identified the patient in the holding area using 2 identifiers.  The surgical site was marked by myself.  She was taken to the operating room after regional block was placed.  The left lower extremity was prepped and draped in normal sterile surgical fashion after general anesthesia was induced.  A nonsterile tourniquet was previously applied to the left thigh.  Time- out was called verifying site and side of surgery.  She did receive IV antibiotics within 60 minutes of beginning the  procedure.  I began by using Esmarch to exsanguinate the lower extremity.  I elevated the tourniquet to 320 mmHg.  She had a small superficial blister over the patella.  I made a longitudinal skin incision staying well medial to this area.  Full-thickness skin flaps were created.  I identified the medial and lateral retinacular tears.  She did have a large prepatellar hematoma.  Her soft tissues including the skin were contused.  I evacuated the hematoma.  I identified the fracture.  She had a very comminuted distal pole patella fracture.  I excised the distal fracture fragments.  I then sharply debrided the patellar surface with a rongeur. I sharply debrided the end of the patellar tendon using curved Mayo scissors.  I then used a #2 FiberWire x2 to place Krackow stitch thus giving 4 limbs in the patellar tendon.  I drilled 3 longitudinal drill holes in the surface of the patella; and using a Houston suture passer, I passed the suture limbs through the drill holes.  I brought the knee into full extension.  I tightened the sutures down manually and then I took the knee through flexion-extension cycles x5 to eliminate the creep within  the tendon repair.  I then brought the knee back into full extension.  The patella was manually held as far distally as possible using a clamp, and then I tightened the 2 sutures through the tunnels. I then repaired the retinaculum with #1 interrupted Vicryl sutures. Once the internal repair was completed, I flexed her knee down to about 70 degrees and there was no gapping.  I then irrigated the subcutaneous tissues.  I closed the deep dermal layer with 2-0 interrupted Monocryl. The skin was reapproximated with staples and Aquacel dressing was applied.  I then applied a bulky cotton dressing followed by an Ace wrap.  A knee immobilizer was placed.  She was then awakened from anesthesia, transferred to PACU in stable condition.  Sponge, needle, and instrument  counts were correct at the end of the case x2.  There were no known complications. I discussed operative events and findings with the patient's family. She may weight bear as tolerated and a knee immobilizer.  We will place her on aspirin 81 mg twice daily for DVT prophylaxis.  I am going to see her back in the office in 2 weeks for suture removal.  She understands that the knee must be kept in full extension at all times.  All questions solicited and answered.          ______________________________ Rod Can, MD     BS/MEDQ  D:  10/31/2015  T:  10/31/2015  Job:  [id number]

## 2015-11-16 ENCOUNTER — Encounter (HOSPITAL_COMMUNITY): Payer: Self-pay | Admitting: Emergency Medicine

## 2015-11-16 ENCOUNTER — Emergency Department (HOSPITAL_COMMUNITY): Payer: Medicare Other

## 2015-11-16 ENCOUNTER — Emergency Department (HOSPITAL_COMMUNITY)
Admission: EM | Admit: 2015-11-16 | Discharge: 2015-11-16 | Disposition: A | Discharge status: Home / Self Care | Payer: Medicare Other | Attending: Emergency Medicine | Admitting: Emergency Medicine

## 2015-11-16 DIAGNOSIS — R042 Hemoptysis: Secondary | ICD-10-CM | POA: Diagnosis present

## 2015-11-16 DIAGNOSIS — Z8709 Personal history of other diseases of the respiratory system: Secondary | ICD-10-CM | POA: Insufficient documentation

## 2015-11-16 DIAGNOSIS — I120 Hypertensive chronic kidney disease with stage 5 chronic kidney disease or end stage renal disease: Secondary | ICD-10-CM | POA: Insufficient documentation

## 2015-11-16 DIAGNOSIS — Z992 Dependence on renal dialysis: Secondary | ICD-10-CM | POA: Insufficient documentation

## 2015-11-16 DIAGNOSIS — Z79899 Other long term (current) drug therapy: Secondary | ICD-10-CM | POA: Insufficient documentation

## 2015-11-16 DIAGNOSIS — N186 End stage renal disease: Secondary | ICD-10-CM | POA: Diagnosis not present

## 2015-11-16 DIAGNOSIS — Z8781 Personal history of (healed) traumatic fracture: Secondary | ICD-10-CM | POA: Diagnosis not present

## 2015-11-16 DIAGNOSIS — Z7982 Long term (current) use of aspirin: Secondary | ICD-10-CM | POA: Insufficient documentation

## 2015-11-16 DIAGNOSIS — K122 Cellulitis and abscess of mouth: Secondary | ICD-10-CM

## 2015-11-16 LAB — RAPID STREP SCREEN (MED CTR MEBANE ONLY): Streptococcus, Group A Screen (Direct): NEGATIVE

## 2015-11-16 MED ORDER — CLINDAMYCIN HCL 300 MG PO CAPS: ORAL_CAPSULE | ORAL | Status: DC

## 2015-11-16 MED ORDER — LACTINEX PO CHEW: 1.0000 | CHEWABLE_TABLET | Freq: Three times a day (TID) | ORAL | Status: DC

## 2015-11-16 MED ORDER — CLINDAMYCIN HCL 150 MG PO CAPS
600.0000 mg | ORAL_CAPSULE | Freq: Once | ORAL | Status: AC
  Administered 2015-11-16: 600 mg via ORAL
  Filled 2015-11-16: qty 4

## 2015-11-16 MED ORDER — CLINDAMYCIN PHOSPHATE 600 MG/50ML IV SOLN: 600.0000 mg | Freq: Once | INTRAVENOUS | Status: DC

## 2015-11-16 MED ORDER — METHYLPREDNISOLONE SODIUM SUCC 125 MG IJ SOLR
125.0000 mg | Freq: Once | INTRAMUSCULAR | Status: AC
  Administered 2015-11-16: 125 mg via INTRAMUSCULAR
  Filled 2015-11-16: qty 2

## 2015-11-16 MED ORDER — PREDNISONE 10 MG (21) PO TBPK: 10.0000 mg | ORAL_TABLET | Freq: Every day | ORAL | Status: DC

## 2015-11-16 NOTE — ED Notes (Signed)
Pt stable, ambulatory, states understanding of discharge instructions 

## 2015-11-16 NOTE — ED Notes (Signed)
Pt from home with c/o pink tinged oral secretions this morning.  Pt reports she felt like she had congestion in her throat and after several attempt of coughing to clear it, noted some blood.  Pt still feels like there is a "knot" in her throat.  NAD, A&O.

## 2015-11-16 NOTE — ED Provider Notes (Signed)
CSN: [id number]     Arrival date & time 11/16/15  1158 History   First MD Initiated Contact with Patient 11/16/15 1403     Chief Complaint  Patient presents with  . Hemoptysis     (Consider location/radiation/quality/duration/timing/severity/associated sxs/prior Treatment) Patient is a 60 y.o. female presenting with pharyngitis. The history is provided by the patient (Patient feels like she's having pain in the back or throat and she's been coughing up a little bit of blood).  Sore Throat This is a new problem. The current episode started 6 to 12 hours ago. The problem occurs constantly. The problem has not changed since onset.Pertinent negatives include no chest pain, no abdominal pain and no headaches. Nothing aggravates the symptoms. Nothing relieves the symptoms.    Past Medical History  Diagnosis Date  . Seasonal allergies   . Secondary hyperparathyroidism, renal ([place])     s/p  total parathyroidectomy  2014  . Complication of anesthesia ~ 2011    "they gave me a medicine that swolled me and mouth burning up" (08/23/2012)  . Hypertension   . History of acute pulmonary edema     2003  . ESRD on hemodialysis [facility]) Nephrologist-- dr deterding    ESRD due to HTN---  dialysis  M/W/F   on [address]  . Left patella fracture   . Wears glasses    Past Surgical History  Procedure Laterality Date  . Dialysis fistula creation  1992    left upper arm ---  w/  Multiple Revision's until 2006  . Revision of arteriovenous goretex graft  09/27/2012    Procedure: REVISION OF ARTERIOVENOUS GORETEX GRAFT;  Surgeon: Mal Misty, MD;  Location: [facility] OR;  Service: Vascular;  Laterality: Right;  1) Replacement of venous half of loop with 35mm Gortex graft  2) Excision of erroded pseudoaneurysm of graft with primary closure.  . Patellectomy  10/03/2012    Procedure: PATELLECTOMY;  Surgeon: Marin Shutter, MD;  Location: [place];  Service: Orthopedics;  Laterality: Right;  RIGHT PARTIAL PATELLECTOMY AND  PATELLA TENDON REPAIR  . Patellar tendon repair  10/03/2012    Procedure: PATELLA TENDON REPAIR;  Surgeon: Marin Shutter, MD;  Location: [place];  Service: Orthopedics;  Laterality: Right;  . Revision of arteriovenous goretex graft Right 01/23/2013    Procedure: REVISION OF ARTERIOVENOUS GORETEX GRAFT;  Surgeon: Conrad Nicholas, MD;  Location: [place];  Service: Vascular;  Laterality: Right;  Using piece of 84mm x 20cm Gortex graft.   . Revision of arteriovenous goretex graft Right 10/07/2015    Procedure: REVISION OF Right arm ARTERIOVENOUS GORETEX GRAFT;  Surgeon: Elam Dutch, MD;  Location: [place];  Service: Vascular;  Laterality: Right;  . Total parathyroidectomy/  thyroid isthmusectomy/  autotransplantation parathyroid tissue to left brachioriadialis muscle  02-18-2011  . Orif left ankle fx  07-31-2008  . Total abdominal hysterectomy  03-05-2005    w/  Right Ovarian Cystectomy  . Ectopic pregnancy surgery  1970's  . Arteriovenous graft placement  03-25-2005    Right forearm  w/  multiple Revision's   . Transthoracic echocardiogram  10-31-2012    mild LVH,  grade 1 diastolic dysfunction,  ef 55-65%/  mild MR/  trivial TR  . Cardiovascular stress test  08-24-2012    abnormal nuclear study/  inferolateral and anteroseptal areas of scar,  no ischemia/  normal LV function and wall motion, ef 77%  . Orif patella Left 10/31/2015    Procedure: OPEN REDUCTION  INTERNAL (ORIF) FIXATION LEFT PATELLA;  Surgeon: Rod Can, MD;  Location: [place];  Service: Orthopedics;  Laterality: Left;   Family History  Problem Relation Age of Onset  . Hypertension Father   . Thyroid disease Father   . Hypertension Sister   . Thyroid disease Sister    Social History  Substance Use Topics  . Smoking status: Never Smoker   . Smokeless tobacco: Never Used  . Alcohol Use: No     Comment: quit 2007   OB History    No data available     Review of Systems  Constitutional: Negative for  appetite change and fatigue.  HENT: Negative for congestion, ear discharge and sinus pressure.        Throat pain  Eyes: Negative for discharge.  Respiratory: Negative for cough.        Coughing up blood  Cardiovascular: Negative for chest pain.  Gastrointestinal: Negative for abdominal pain and diarrhea.  Genitourinary: Negative for frequency and hematuria.  Musculoskeletal: Negative for back pain.  Skin: Negative for rash.  Neurological: Negative for seizures and headaches.  Psychiatric/Behavioral: Negative for hallucinations.      Allergies  Lisinopril and Norvasc  Home Medications   Prior to Admission medications   Medication Sig Start Date End Date Taking? Authorizing Provider  aspirin 81 MG tablet Take 1 tablet (81 mg total) by mouth 2 (two) times daily after a meal. Reported on 10/02/2015 10/31/15  Yes Rod Can, MD  calcium acetate (PHOSLO) 667 MG capsule Take 667 mg by mouth 3 (three) times daily with meals.   Yes Historical Provider, MD  doxycycline (VIBRAMYCIN) 100 MG capsule Take 100 mg by mouth 2 (two) times daily. 14 day course started 11/14/15   Yes Historical Provider, MD  omeprazole (PRILOSEC) 20 MG capsule Take 1 capsule (20 mg total) by mouth daily. 10/17/15  Yes Jola Schmidt, MD  ondansetron (ZOFRAN) 4 MG tablet Take 1 tablet (4 mg total) by mouth every 8 (eight) hours as needed for nausea or vomiting. 10/31/15  Yes Rod Can, MD  oxyCODONE (ROXICODONE) 5 MG immediate release tablet Take 1-2 tablets (5-10 mg total) by mouth every 3 (three) hours as needed for severe pain. 10/31/15  Yes Rod Can, MD  senna (SENOKOT) 8.6 MG TABS tablet Take 2 tablets (17.2 mg total) by mouth at bedtime. 10/31/15  Yes Rod Can, MD  clindamycin (CLEOCIN) 300 MG capsule One pill qid 11/16/15   Milton Ferguson, MD  predniSONE (STERAPRED UNI-PAK 21 TAB) 10 MG (21) TBPK tablet Take 1 tablet (10 mg total) by mouth daily. Take 6 tabs by mouth daily  for 2 days, then 5 tabs for 2  days, then 4 tabs for 2 days, then 3 tabs for 2 days, 2 tabs for 2 days, then 1 tab by mouth daily for 2 days 11/16/15   Milton Ferguson, MD   BP 164/101 mmHg  Pulse 89  Temp(Src) 97.8 F (36.6 C) (Oral)  Resp 20  SpO2 97% Physical Exam  Constitutional: She is oriented to person, place, and time. She appears well-developed.  HENT:  Head: Normocephalic.  Patient uvula is very enlarged and has little bit of blood on it  Eyes: Conjunctivae and EOM are normal. No scleral icterus.  Neck: Neck supple. No thyromegaly present.  Cardiovascular: Normal rate and regular rhythm.  Exam reveals no gallop and no friction rub.   No murmur heard. Pulmonary/Chest: No stridor. She has no wheezes. She has no rales. She exhibits  no tenderness.  Abdominal: She exhibits no distension. There is no tenderness. There is no rebound.  Musculoskeletal: Normal range of motion. She exhibits no edema.  Lymphadenopathy:    She has no cervical adenopathy.  Neurological: She is oriented to person, place, and time. She exhibits normal muscle tone. Coordination normal.  Skin: No rash noted. No erythema.  Psychiatric: She has a normal mood and affect. Her behavior is normal.    ED Course  Procedures (including critical care time) Labs Review Labs Reviewed  RAPID STREP SCREEN (NOT AT [facility])  CULTURE, GROUP A STREP [facility])    Imaging Review Ct Soft Tissue Neck Wo Contrast  11/16/2015  CLINICAL DATA:  Throat pain, enlarged uvula. EXAM: CT NECK WITHOUT CONTRAST TECHNIQUE: Multidetector CT imaging of the neck was performed following the standard protocol without intravenous contrast. COMPARISON:  None. FINDINGS: The inferior aspect of the soft palate, including the uvula, appears mildly bulbous, measuring 11 x 11 mm cross-section. See image 36 series 3. Correlate clinically for soft palate inflammation. Low-attenuation central area could indicate abscess formation. No other pharyngeal or laryngeal abnormalities. Unremarkable  salivary glands. No pathologic adenopathy within limits for assessment on noncontrast exam. Atherosclerotic calcification at the bifurcations. Previous thyroid surgery clips. No lung apex lesion. Cervical spondylosis. Negative intracranial compartment. No sinus or mastoid fluid. Unremarkable orbits. IMPRESSION: Soft palate and uvular enlargement of uncertain significance. Recommend correlation with physical exam. Soft palate inflammation related to pharyngitis, or even phlegmon or early abscess formation, not excluded. No tonsillar, parapharyngeal, or retropharyngeal masses or fluid collections of significance. Electronically Signed   By: Staci Righter M.D.   On: 11/16/2015 16:02   I have personally reviewed and evaluated these images and lab results as part of my medical decision-making.   EKG Interpretation None      MDM   Final diagnoses:  Uvulitis      Patient with uvulitis. I spoke with ENT doctor and will start the patient on clindamycin and steroids and she will be followed up in 1-2 weeks  Milton Ferguson, MD 11/16/15 [phone]

## 2015-11-19 LAB — CULTURE, GROUP A STREP (THRC)

## 2015-11-27 ENCOUNTER — Encounter (HOSPITAL_COMMUNITY): Payer: Self-pay | Admitting: *Deleted

## 2015-11-27 NOTE — Anesthesia Preprocedure Evaluation (Addendum)
Anesthesia Evaluation  Patient identified by MRN, date of birth, ID band Patient awake    Reviewed: Allergy & Precautions, NPO status , Patient's Chart, lab work & pertinent test results  History of Anesthesia Complications (+) history of anesthetic complications ([id number]: "they gave me a medicine that swolled me and mouth burning up" )  Airway Mallampati: II  TM Distance: >3 FB Neck ROM: Full    Dental  (+) Dental Advisory Given, Missing, Poor Dentition   Pulmonary neg pulmonary ROS,    Pulmonary exam normal breath sounds clear to auscultation       Cardiovascular hypertension, (-) angina(-) Past MI Normal cardiovascular exam Rhythm:Regular Rate:Normal     Neuro/Psych negative neurological ROS  negative psych ROS   GI/Hepatic Neg liver ROS, GERD  Medicated,  Endo/Other  Secondary hyperparathyroidism s/p total parathyroidectomy in 2014  Obesity   Renal/GU Dialysis and ESRFRenal disease              Musculoskeletal negative musculoskeletal ROS (+)   Abdominal   Peds  Hematology negative hematology ROS (+)   Anesthesia Other Findings Day of surgery medications reviewed with the patient.  Reproductive/Obstetrics                           Lab Results  Component Value Date   WBC 10.2 11/28/2015   HGB 11.4* 11/28/2015   HCT 36.1 11/28/2015   MCV 83.4 11/28/2015   PLT 250 11/28/2015   Lab Results  Component Value Date   CREATININE 11.82* 11/28/2015   BUN 53* 11/28/2015   NA 143 11/28/2015   K 4.2 11/28/2015   CL 103 11/28/2015   CO2 25 11/28/2015    Anesthesia Physical  Anesthesia Plan  ASA: III  Anesthesia Plan: General   Post-op Pain Management:    Induction: Intravenous  Airway Management Planned: LMA  Additional Equipment:   Intra-op Plan:   Post-operative Plan: Extubation in OR  Informed Consent: I have reviewed the patients History and Physical, chart, labs and  discussed the procedure including the risks, benefits and alternatives for the proposed anesthesia with the patient or authorized representative who has indicated his/her understanding and acceptance.   Dental advisory given  Plan Discussed with: CRNA  Anesthesia Plan Comments:         Anesthesia Quick Evaluation

## 2015-11-27 NOTE — Progress Notes (Signed)
Dr. Tobias Alexander, Anesthesia, made aware of pt history; no new orders given.

## 2015-11-27 NOTE — Progress Notes (Signed)
Pt denies SOB, chest pain, and being under the care of a cardiologist. Pt stated , " I haven't seen a cardiologist in over 2 years, I forgot his name."  Pt denies having a cardiac cath. Pt denies having a CXR within the last year. Pt made aware to stop otc vitamins, fish oil, herbal medications and NSAID's. Pt verbalized understanding of all pre-op instructions.

## 2015-11-28 ENCOUNTER — Inpatient Hospital Stay (HOSPITAL_COMMUNITY): Payer: Medicare Other | Admitting: Anesthesiology

## 2015-11-28 ENCOUNTER — Encounter (HOSPITAL_COMMUNITY)
Admission: RE | Disposition: A | Discharge status: Home / Self Care | Payer: Self-pay | Source: Ambulatory Visit | Attending: Orthopedic Surgery

## 2015-11-28 ENCOUNTER — Encounter (HOSPITAL_COMMUNITY): Payer: Self-pay | Admitting: General Practice

## 2015-11-28 ENCOUNTER — Inpatient Hospital Stay (HOSPITAL_COMMUNITY)
Admission: RE | Admit: 2015-11-28 | Discharge: 2015-12-01 | DRG: 907 | Disposition: A | Discharge status: Home / Self Care | Payer: Medicare Other | Source: Ambulatory Visit | Attending: Orthopedic Surgery | Admitting: Orthopedic Surgery

## 2015-11-28 DIAGNOSIS — I120 Hypertensive chronic kidney disease with stage 5 chronic kidney disease or end stage renal disease: Secondary | ICD-10-CM | POA: Diagnosis present

## 2015-11-28 DIAGNOSIS — Z992 Dependence on renal dialysis: Secondary | ICD-10-CM | POA: Diagnosis not present

## 2015-11-28 DIAGNOSIS — T8131XA Disruption of external operation (surgical) wound, not elsewhere classified, initial encounter: Secondary | ICD-10-CM | POA: Diagnosis present

## 2015-11-28 DIAGNOSIS — N186 End stage renal disease: Secondary | ICD-10-CM | POA: Diagnosis present

## 2015-11-28 DIAGNOSIS — D649 Anemia, unspecified: Secondary | ICD-10-CM | POA: Diagnosis present

## 2015-11-28 DIAGNOSIS — Y839 Surgical procedure, unspecified as the cause of abnormal reaction of the patient, or of later complication, without mention of misadventure at the time of the procedure: Secondary | ICD-10-CM | POA: Diagnosis present

## 2015-11-28 DIAGNOSIS — Z7982 Long term (current) use of aspirin: Secondary | ICD-10-CM | POA: Diagnosis not present

## 2015-11-28 DIAGNOSIS — N2581 Secondary hyperparathyroidism of renal origin: Secondary | ICD-10-CM | POA: Diagnosis present

## 2015-11-28 DIAGNOSIS — T8132XA Disruption of internal operation (surgical) wound, not elsewhere classified, initial encounter: Secondary | ICD-10-CM | POA: Diagnosis present

## 2015-11-28 HISTORY — PX: APPLICATION OF WOUND VAC: SHX5189

## 2015-11-28 HISTORY — DX: Dependence on renal dialysis: Z99.2

## 2015-11-28 HISTORY — DX: Disruption of external operation (surgical) wound, not elsewhere classified, initial encounter: T81.31XA

## 2015-11-28 HISTORY — PX: INCISION AND DRAINAGE OF WOUND: SHX1803

## 2015-11-28 HISTORY — PX: I & D EXTREMITY: SHX5045

## 2015-11-28 HISTORY — DX: End stage renal disease: N18.6

## 2015-11-28 LAB — COMPREHENSIVE METABOLIC PANEL
ALT: 9 U/L — AB (ref 14–54)
ANION GAP: 15 (ref 5–15)
AST: 14 U/L — ABNORMAL LOW (ref 15–41)
Albumin: 2.7 g/dL — ABNORMAL LOW (ref 3.5–5.0)
Alkaline Phosphatase: 45 U/L (ref 38–126)
BUN: 53 mg/dL — ABNORMAL HIGH (ref 6–20)
CHLORIDE: 103 mmol/L (ref 101–111)
CO2: 25 mmol/L (ref 22–32)
CREATININE: 11.82 mg/dL — AB (ref 0.44–1.00)
Calcium: 8.8 mg/dL — ABNORMAL LOW (ref 8.9–10.3)
GFR, EST AFRICAN AMERICAN: 4 mL/min — AB (ref >60)
GFR, EST NON AFRICAN AMERICAN: 3 mL/min — AB (ref >60)
Glucose, Bld: 89 mg/dL (ref 65–99)
POTASSIUM: 4.2 mmol/L (ref 3.5–5.1)
SODIUM: 143 mmol/L (ref 135–145)
Total Bilirubin: 0.4 mg/dL (ref 0.3–1.2)
Total Protein: 5.5 g/dL — ABNORMAL LOW (ref 6.5–8.1)

## 2015-11-28 LAB — CBC WITH DIFFERENTIAL/PLATELET
Basophils Absolute: 0.1 10*3/uL (ref 0.0–0.1)
Basophils Relative: 1 %
EOS ABS: 0.4 10*3/uL (ref 0.0–0.7)
EOS PCT: 4 %
HCT: 36.1 % (ref 36.0–46.0)
Hemoglobin: 11.4 g/dL — ABNORMAL LOW (ref 12.0–15.0)
LYMPHS ABS: 1.7 10*3/uL (ref 0.7–4.0)
LYMPHS PCT: 17 %
MCH: 26.3 pg (ref 26.0–34.0)
MCHC: 31.6 g/dL (ref 30.0–36.0)
MCV: 83.4 fL (ref 78.0–100.0)
MONO ABS: 0.8 10*3/uL (ref 0.1–1.0)
MONOS PCT: 8 %
Neutro Abs: 7.1 10*3/uL (ref 1.7–7.7)
Neutrophils Relative %: 70 %
PLATELETS: 250 10*3/uL (ref 150–400)
RBC: 4.33 MIL/uL (ref 3.87–5.11)
RDW: 15.3 % (ref 11.5–15.5)
WBC: 10.2 10*3/uL (ref 4.0–10.5)

## 2015-11-28 LAB — PROTIME-INR
INR: 1.07 (ref 0.00–1.49)
PROTHROMBIN TIME: 14.1 s (ref 11.6–15.2)

## 2015-11-28 LAB — APTT: aPTT: 35 seconds (ref 24–37)

## 2015-11-28 SURGERY — IRRIGATION AND DEBRIDEMENT EXTREMITY
Anesthesia: General | Site: Knee | Laterality: Left

## 2015-11-28 MED ORDER — SODIUM CHLORIDE 0.9 % IR SOLN
Status: DC | PRN
  Administered 2015-11-28: 3000 mL

## 2015-11-28 MED ORDER — CEFAZOLIN SODIUM-DEXTROSE 2-3 GM-% IV SOLR
2.0000 g | INTRAVENOUS | Status: AC
  Administered 2015-11-28: 2 g via INTRAVENOUS

## 2015-11-28 MED ORDER — ONDANSETRON HCL 4 MG PO TABS: 4.0000 mg | ORAL_TABLET | Freq: Four times a day (QID) | ORAL | Status: DC | PRN

## 2015-11-28 MED ORDER — PROPOFOL 10 MG/ML IV BOLUS
INTRAVENOUS | Status: AC
  Filled 2015-11-28: qty 20

## 2015-11-28 MED ORDER — PIPERACILLIN-TAZOBACTAM IN DEX 2-0.25 GM/50ML IV SOLN
2.2500 g | Freq: Three times a day (TID) | INTRAVENOUS | Status: DC
  Administered 2015-11-28 – 2015-12-01 (×8): 2.25 g via INTRAVENOUS
  Filled 2015-11-28 (×13): qty 50

## 2015-11-28 MED ORDER — LIDOCAINE HCL (CARDIAC) 20 MG/ML IV SOLN
INTRAVENOUS | Status: AC
  Filled 2015-11-28: qty 5

## 2015-11-28 MED ORDER — METOCLOPRAMIDE HCL 5 MG PO TABS: 5.0000 mg | ORAL_TABLET | Freq: Three times a day (TID) | ORAL | Status: DC | PRN

## 2015-11-28 MED ORDER — ACETAMINOPHEN 650 MG RE SUPP: 650.0000 mg | Freq: Four times a day (QID) | RECTAL | Status: DC | PRN

## 2015-11-28 MED ORDER — OXYCODONE HCL 5 MG PO TABS
ORAL_TABLET | ORAL | Status: AC
  Filled 2015-11-28: qty 2

## 2015-11-28 MED ORDER — OXYCODONE HCL 5 MG PO TABS
5.0000 mg | ORAL_TABLET | ORAL | Status: DC | PRN
  Administered 2015-11-28 – 2015-11-29 (×7): 10 mg via ORAL
  Filled 2015-11-28 (×7): qty 2

## 2015-11-28 MED ORDER — VANCOMYCIN HCL 10 G IV SOLR
1500.0000 mg | Freq: Once | INTRAVENOUS | Status: AC
  Administered 2015-11-28: 1500 mg via INTRAVENOUS
  Filled 2015-11-28: qty 1500

## 2015-11-28 MED ORDER — FENTANYL CITRATE (PF) 100 MCG/2ML IJ SOLN
INTRAMUSCULAR | Status: AC
  Filled 2015-11-28: qty 2

## 2015-11-28 MED ORDER — PHENYLEPHRINE HCL 10 MG/ML IJ SOLN
10.0000 mg | INTRAVENOUS | Status: DC | PRN
  Administered 2015-11-28: 25 ug/min via INTRAVENOUS

## 2015-11-28 MED ORDER — CALCIUM ACETATE 667 MG PO CAPS: 667.0000 mg | ORAL_CAPSULE | Freq: Three times a day (TID) | ORAL | Status: DC

## 2015-11-28 MED ORDER — FENTANYL CITRATE (PF) 100 MCG/2ML IJ SOLN
INTRAMUSCULAR | Status: DC | PRN
  Administered 2015-11-28 (×2): 25 ug via INTRAVENOUS
  Administered 2015-11-28: 50 ug
  Administered 2015-11-28: 50 ug via INTRAVENOUS
  Administered 2015-11-28 (×6): 25 ug via INTRAVENOUS

## 2015-11-28 MED ORDER — MIDAZOLAM HCL 5 MG/5ML IJ SOLN
INTRAMUSCULAR | Status: DC | PRN
  Administered 2015-11-28: 1 mg via INTRAVENOUS

## 2015-11-28 MED ORDER — SODIUM CHLORIDE 0.9 % IV SOLN
INTRAVENOUS | Status: DC
  Administered 2015-11-28: 21:00:00 via INTRAVENOUS

## 2015-11-28 MED ORDER — LACTATED RINGERS IV SOLN: INTRAVENOUS | Status: DC

## 2015-11-28 MED ORDER — ACETAMINOPHEN 325 MG PO TABS: 650.0000 mg | ORAL_TABLET | Freq: Four times a day (QID) | ORAL | Status: DC | PRN

## 2015-11-28 MED ORDER — CALCIUM ACETATE (PHOS BINDER) 667 MG PO CAPS
667.0000 mg | ORAL_CAPSULE | Freq: Three times a day (TID) | ORAL | Status: DC
  Administered 2015-11-28 – 2015-12-01 (×8): 667 mg via ORAL
  Filled 2015-11-28 (×8): qty 1

## 2015-11-28 MED ORDER — FENTANYL CITRATE (PF) 100 MCG/2ML IJ SOLN
25.0000 ug | INTRAMUSCULAR | Status: DC | PRN
  Administered 2015-11-28: 25 ug via INTRAVENOUS
  Administered 2015-11-28: 50 ug via INTRAVENOUS
  Administered 2015-11-28: 25 ug via INTRAVENOUS

## 2015-11-28 MED ORDER — 0.9 % SODIUM CHLORIDE (POUR BTL) OPTIME
TOPICAL | Status: DC | PRN
  Administered 2015-11-28: 1000 mL

## 2015-11-28 MED ORDER — FENTANYL CITRATE (PF) 250 MCG/5ML IJ SOLN
INTRAMUSCULAR | Status: AC
  Filled 2015-11-28: qty 5

## 2015-11-28 MED ORDER — FENTANYL CITRATE (PF) 100 MCG/2ML IJ SOLN
50.0000 ug | Freq: Once | INTRAMUSCULAR | Status: AC
  Administered 2015-11-28: 50 ug via INTRAVENOUS

## 2015-11-28 MED ORDER — ONDANSETRON HCL 4 MG/2ML IJ SOLN: 4.0000 mg | Freq: Four times a day (QID) | INTRAMUSCULAR | Status: DC | PRN

## 2015-11-28 MED ORDER — SODIUM CHLORIDE 0.9 % IV SOLN
INTRAVENOUS | Status: DC | PRN
  Administered 2015-11-28 (×2): via INTRAVENOUS

## 2015-11-28 MED ORDER — ONDANSETRON HCL 4 MG/2ML IJ SOLN
INTRAMUSCULAR | Status: AC
  Filled 2015-11-28: qty 2

## 2015-11-28 MED ORDER — METOCLOPRAMIDE HCL 5 MG/ML IJ SOLN
5.0000 mg | Freq: Three times a day (TID) | INTRAMUSCULAR | Status: DC | PRN
  Administered 2015-11-29: 10 mg via INTRAVENOUS
  Filled 2015-11-28: qty 2

## 2015-11-28 MED ORDER — PROPOFOL 10 MG/ML IV BOLUS
INTRAVENOUS | Status: DC | PRN
  Administered 2015-11-28: 120 mg via INTRAVENOUS

## 2015-11-28 MED ORDER — MIDAZOLAM HCL 2 MG/2ML IJ SOLN
INTRAMUSCULAR | Status: AC
  Filled 2015-11-28: qty 2

## 2015-11-28 MED ORDER — PANTOPRAZOLE SODIUM 40 MG PO TBEC
40.0000 mg | DELAYED_RELEASE_TABLET | Freq: Every day | ORAL | Status: DC
  Administered 2015-11-28 – 2015-12-01 (×4): 40 mg via ORAL
  Filled 2015-11-28 (×2): qty 1

## 2015-11-28 MED ORDER — PHENYLEPHRINE HCL 10 MG/ML IJ SOLN
INTRAMUSCULAR | Status: DC | PRN
  Administered 2015-11-28 (×2): 40 ug via INTRAVENOUS
  Administered 2015-11-28: 80 ug via INTRAVENOUS
  Administered 2015-11-28 (×2): 40 ug via INTRAVENOUS
  Administered 2015-11-28 (×2): 80 ug via INTRAVENOUS

## 2015-11-28 MED ORDER — ONDANSETRON HCL 4 MG/2ML IJ SOLN
INTRAMUSCULAR | Status: DC | PRN
  Administered 2015-11-28: 4 mg via INTRAVENOUS

## 2015-11-28 MED ORDER — CHLORHEXIDINE GLUCONATE 4 % EX LIQD: 60.0000 mL | Freq: Once | CUTANEOUS | Status: DC

## 2015-11-28 MED ORDER — LACTINEX PO CHEW
1.0000 | CHEWABLE_TABLET | Freq: Three times a day (TID) | ORAL | Status: DC
  Administered 2015-11-28 – 2015-12-01 (×8): 1 via ORAL
  Filled 2015-11-28 (×13): qty 1

## 2015-11-28 MED ORDER — PROMETHAZINE HCL 25 MG/ML IJ SOLN: 6.2500 mg | INTRAMUSCULAR | Status: DC | PRN

## 2015-11-28 MED ORDER — LIDOCAINE HCL (CARDIAC) 20 MG/ML IV SOLN
INTRAVENOUS | Status: DC | PRN
  Administered 2015-11-28: 60 mg via INTRAVENOUS

## 2015-11-28 SURGICAL SUPPLY — 52 items
BANDAGE ACE 6X5 VEL STRL LF (GAUZE/BANDAGES/DRESSINGS) ×2 IMPLANT
BLADE SURG 10 STRL SS (BLADE) IMPLANT
BLADE SURG 21 STRL SS (BLADE) ×1 IMPLANT
BNDG COHESIVE 4X5 TAN STRL (GAUZE/BANDAGES/DRESSINGS) ×2 IMPLANT
BNDG COHESIVE 6X5 TAN STRL LF (GAUZE/BANDAGES/DRESSINGS) IMPLANT
BNDG GAUZE ELAST 4 BULKY (GAUZE/BANDAGES/DRESSINGS) ×2 IMPLANT
CANISTER WOUND CARE 500ML ATS (WOUND CARE) ×2 IMPLANT
COVER SURGICAL LIGHT HANDLE (MISCELLANEOUS) ×6 IMPLANT
DRAPE U-SHAPE 47X51 STRL (DRAPES) ×3 IMPLANT
DRSG ADAPTIC 3X8 NADH LF (GAUZE/BANDAGES/DRESSINGS) ×3 IMPLANT
DRSG VAC ATS SM SENSATRAC (GAUZE/BANDAGES/DRESSINGS) ×2 IMPLANT
DURAPREP 26ML APPLICATOR (WOUND CARE) ×1 IMPLANT
ELECT CAUTERY BLADE 6.4 (BLADE) ×2 IMPLANT
ELECT REM PT RETURN 9FT ADLT (ELECTROSURGICAL) ×3
ELECTRODE REM PT RTRN 9FT ADLT (ELECTROSURGICAL) IMPLANT
FACESHIELD STD STERILE (MASK) ×2 IMPLANT
GAUZE SPONGE 4X4 12PLY STRL (GAUZE/BANDAGES/DRESSINGS) ×3 IMPLANT
GLOVE BIO SURGEON STRL SZ8.5 (GLOVE) ×7 IMPLANT
GLOVE BIOGEL PI IND STRL 7.5 (GLOVE) IMPLANT
GLOVE BIOGEL PI IND STRL 8.5 (GLOVE) ×1 IMPLANT
GLOVE BIOGEL PI INDICATOR 7.5 (GLOVE) ×2
GLOVE BIOGEL PI INDICATOR 8.5 (GLOVE) ×2
GLOVE SURG SS PI 6.5 STRL IVOR (GLOVE) ×2 IMPLANT
GOWN STRL REUS W/ TWL LRG LVL3 (GOWN DISPOSABLE) ×1 IMPLANT
GOWN STRL REUS W/ TWL XL LVL3 (GOWN DISPOSABLE) ×2 IMPLANT
GOWN STRL REUS W/TWL LRG LVL3 (GOWN DISPOSABLE) ×3
GOWN STRL REUS W/TWL XL LVL3 (GOWN DISPOSABLE) ×3
HANDPIECE INTERPULSE COAX TIP (DISPOSABLE) ×3
IMMOBILIZER KNEE 20 (SOFTGOODS) ×3
IMMOBILIZER KNEE 20 THIGH 36 (SOFTGOODS) IMPLANT
KIT BASIN OR (CUSTOM PROCEDURE TRAY) ×3 IMPLANT
KIT ROOM TURNOVER OR (KITS) ×3 IMPLANT
MANIFOLD NEPTUNE II (INSTRUMENTS) ×3 IMPLANT
NS IRRIG 1000ML POUR BTL (IV SOLUTION) ×3 IMPLANT
PACK ORTHO EXTREMITY (CUSTOM PROCEDURE TRAY) ×3 IMPLANT
PAD ARMBOARD 7.5X6 YLW CONV (MISCELLANEOUS) ×6 IMPLANT
SET HNDPC FAN SPRY TIP SCT (DISPOSABLE) IMPLANT
STAPLER VISISTAT 35W (STAPLE) ×2 IMPLANT
STOCKINETTE IMPERVIOUS 9X36 MD (GAUZE/BANDAGES/DRESSINGS) IMPLANT
SUT ETHILON 2 0 PSLX (SUTURE) ×6 IMPLANT
SUT MON AB 2-0 CT1 36 (SUTURE) ×2 IMPLANT
SUT PDS AB 1 CT  36 (SUTURE) ×2
SUT PDS AB 1 CT 36 (SUTURE) IMPLANT
SWAB COLLECTION DEVICE MRSA (MISCELLANEOUS) ×3 IMPLANT
TOWEL OR 17X24 6PK STRL BLUE (TOWEL DISPOSABLE) ×3 IMPLANT
TOWEL OR 17X26 10 PK STRL BLUE (TOWEL DISPOSABLE) ×3 IMPLANT
TUBE ANAEROBIC SPECIMEN COL (MISCELLANEOUS) ×2 IMPLANT
TUBE CONNECTING 12'X1/4 (SUCTIONS) ×1
TUBE CONNECTING 12X1/4 (SUCTIONS) ×2 IMPLANT
UNDERPAD 30X30 INCONTINENT (UNDERPADS AND DIAPERS) ×3 IMPLANT
WATER STERILE IRR 1000ML POUR (IV SOLUTION) ×3 IMPLANT
YANKAUER SUCT BULB TIP NO VENT (SUCTIONS) ×3 IMPLANT

## 2015-11-28 NOTE — Anesthesia Procedure Notes (Signed)
Procedure Name: LMA Insertion Date/Time: 11/28/2015 7:24 AM Performed by: Clearnce Sorrel Pre-anesthesia Checklist: Patient identified, Emergency Drugs available, Suction available, Patient being monitored and Timeout performed Patient Re-evaluated:Patient Re-evaluated prior to inductionOxygen Delivery Method: Circle system utilized Preoxygenation: Pre-oxygenation with 100% oxygen Intubation Type: IV induction LMA: LMA inserted LMA Size: 4.0 Number of attempts: 1 Placement Confirmation: positive ETCO2 and breath sounds checked- equal and bilateral Tube secured with: Tape Dental Injury: Teeth and Oropharynx as per pre-operative assessment

## 2015-11-28 NOTE — Care Management (Signed)
Utilization review completed. Staphany Ditton, RN Case Manager [phone]. 

## 2015-11-28 NOTE — Progress Notes (Signed)
Pt alert and verbally responsive; pt arrived to the unit via bed from PACU; IV intact and transfusing; wound vac intact to LLE; Knee immobilizer on and ace wrap dsg clean, dry and intact to LLE. VSS; Pt oriented to the unit and room; no pressure ulcer noted; pt in bed with call light within reach. Will closely monitor and report off to oncoming RN. Delia Heady RN

## 2015-11-28 NOTE — Evaluation (Signed)
Physical Therapy Evaluation Patient Details Name: Victoria [name] MRN: [id number] DOB: 1955-12-17 Today's Date: 11/28/2015   History of Present Illness  60 y.o. female s/p IRRIGATION AND DEBRIDEMENT LEFT KNEE WOUND with application of wound vac. Hx of ESRD on hemodialysis .  Clinical Impression   Pt admitted with above diagnosis. Pt currently with functional limitations due to the deficits listed below (see PT Problem List). Ambulates generally well with single crutch for support post op day #0. Educated on positioning and use of knee immobilizer at all times per MD order (no left knee flexion.) States she is near her baseline with mobility although with more pain, understandably. Has been managing well at home, staying with her son and daughter-in-law. Adequate for d/c from PT standpoint when medically ready. Will monitor and progress as tolerated while admitted.     Follow Up Recommendations Outpatient PT (When cleared by MD for ROM and strengthening)    Equipment Recommendations  None recommended by PT    Recommendations for Other Services       Precautions / Restrictions Precautions Precautions: Knee Precaution Comments: DO NOT FLEX LEFT KNEE Required Braces or Orthoses: Knee Immobilizer - Left Knee Immobilizer - Left: On at all times Restrictions Weight Bearing Restrictions: Yes LLE Weight Bearing: Weight bearing as tolerated Other Position/Activity Restrictions: DO NOT FLEX LEFT KNEE      Mobility  Bed Mobility Overal bed mobility: Modified Independent             General bed mobility comments: extra time  Transfers Overall transfer level: Needs assistance Equipment used: Crutches Transfers: Sit to/from Stand Sit to Stand: Supervision         General transfer comment: supervision for safety. VC for crutch use. Good stability with this device.  Ambulation/Gait Ambulation/Gait assistance: Supervision Ambulation Distance (Feet): 100 Feet Assistive device:  Crutches Gait Pattern/deviations: Step-to pattern;Decreased step length - right;Decreased stance time - left;Decreased stride length;Antalgic Gait velocity: decreased Gait velocity interpretation: Below normal speed for age/gender General Gait Details: Educated on safe DME use with a single crutch. Declines to use RW. Deomonstrates good balance and sequencing with this device. Moderately antalgic but stable without any physical assist needed during bout today.  Stairs            Wheelchair Mobility    Modified Rankin (Stroke Patients Only)       Balance Overall balance assessment: Modified Independent                                           Pertinent Vitals/Pain Pain Assessment: 0-10 Pain Score: 6  Pain Location: Lt knee Pain Descriptors / Indicators: Sore Pain Intervention(s): Monitored during session;Repositioned    Home Living Family/patient expects to be discharged to:: Private residence Living Arrangements: Children Available Help at Discharge: Family;Available PRN/intermittently (Family works during the day) Type of Home: House Home Access: Level entry     Home Layout: One level [place]: Crutches;Walker - 2 wheels      Prior Function Level of Independence: Independent with assistive device(s)         Comments: used RW to ambulate to falling.      Hand Dominance   Dominant Hand: Right    Extremity/Trunk Assessment   Upper Extremity Assessment: Defer to OT evaluation           Lower Extremity Assessment: LLE deficits/detail   LLE  Deficits / Details: In knee immobilizer, able to move ankle and toes.     Communication   Communication: No difficulties  Cognition Arousal/Alertness: Awake/alert Behavior During Therapy: WFL for tasks assessed/performed Overall Cognitive Status: Within Functional Limits for tasks assessed                      General Comments General comments (skin integrity, edema, etc.):  Educated to maintain knee immobilizer at all times per MD orders, and for full knee extension at all times.    Exercises General Exercises - Lower Extremity Ankle Circles/Pumps: AROM;Both;10 reps;Seated Quad Sets: Strengthening;Both;10 reps;Seated      Assessment/Plan    PT Assessment Patient needs continued PT services  PT Diagnosis Abnormality of gait;Acute pain   PT Problem List Decreased strength;Decreased activity tolerance;Decreased balance;Decreased mobility;Decreased knowledge of precautions;Pain  PT Treatment Interventions DME instruction;Gait training;Functional mobility training;Therapeutic activities;Therapeutic exercise;Balance training;Neuromuscular re-education;Patient/family education   PT Goals (Current goals can be found in the Care Plan section) Acute Rehab PT Goals Patient Stated Goal: None stated PT Goal Formulation: With patient Time For Goal Achievement: 12/12/15 Potential to Achieve Goals: Good    Frequency Min 3X/week   Barriers to discharge Decreased caregiver support Will be alone during the day while son works    Co-evaluation               End of Session Equipment Utilized During Treatment: Left knee immobilizer Activity Tolerance: Patient tolerated treatment well Patient left: in chair;with call bell/phone within reach Nurse Communication: Mobility status         Time: [id number] PT Time Calculation (min) (ACUTE ONLY): 24 min   Charges:   PT Evaluation $PT Eval Low Complexity: 1 Procedure PT Treatments $Gait Training: 8-22 mins   PT G CodesEllouise Newer 11/28/2015, 5:25 PM [name], [name]

## 2015-11-28 NOTE — H&P (Signed)
PREOPERATIVE H&P  Chief Complaint: WOUND DEHISCENCE LEFT KNEE   HPI: Victoria [name] is a 60 y.o. female who presents for preoperative history and physical with a diagnosis of WOUND DEHISCENCE LEFT KNEE .  She has elected for surgical management.   Past Medical History  Diagnosis Date  . Seasonal allergies   . Secondary hyperparathyroidism, renal ([place])     s/p  total parathyroidectomy  2014  . Complication of anesthesia ~ 2011    "they gave me a medicine that swolled me and mouth burning up" (08/23/2012)  . Hypertension   . History of acute pulmonary edema     2003  . ESRD on hemodialysis [facility]) Nephrologist-- dr deterding    ESRD due to HTN---  dialysis  M/W/F   on [address]  . Left patella fracture   . Wears glasses    Past Surgical History  Procedure Laterality Date  . Dialysis fistula creation  1992    left upper arm ---  w/  Multiple Revision's until 2006  . Revision of arteriovenous goretex graft  09/27/2012    Procedure: REVISION OF ARTERIOVENOUS GORETEX GRAFT;  Surgeon: Mal Misty, MD;  Location: [facility] OR;  Service: Vascular;  Laterality: Right;  1) Replacement of venous half of loop with 90mm Gortex graft  2) Excision of erroded pseudoaneurysm of graft with primary closure.  . Patellectomy  10/03/2012    Procedure: PATELLECTOMY;  Surgeon: Marin Shutter, MD;  Location: [place];  Service: Orthopedics;  Laterality: Right;  RIGHT PARTIAL PATELLECTOMY AND PATELLA TENDON REPAIR  . Patellar tendon repair  10/03/2012    Procedure: PATELLA TENDON REPAIR;  Surgeon: Marin Shutter, MD;  Location: [place];  Service: Orthopedics;  Laterality: Right;  . Revision of arteriovenous goretex graft Right 01/23/2013    Procedure: REVISION OF ARTERIOVENOUS GORETEX GRAFT;  Surgeon: Conrad San Carlos, MD;  Location: [place];  Service: Vascular;  Laterality: Right;  Using piece of 61mm x 20cm Gortex graft.   . Revision of arteriovenous goretex graft Right 10/07/2015    Procedure: REVISION OF Right arm ARTERIOVENOUS  GORETEX GRAFT;  Surgeon: Elam Dutch, MD;  Location: [place];  Service: Vascular;  Laterality: Right;  . Total parathyroidectomy/  thyroid isthmusectomy/  autotransplantation parathyroid tissue to left brachioriadialis muscle  02-18-2011  . Orif left ankle fx  07-31-2008  . Total abdominal hysterectomy  03-05-2005    w/  Right Ovarian Cystectomy  . Ectopic pregnancy surgery  1970's  . Arteriovenous graft placement  03-25-2005    Right forearm  w/  multiple Revision's   . Transthoracic echocardiogram  10-31-2012    mild LVH,  grade 1 diastolic dysfunction,  ef 55-65%/  mild MR/  trivial TR  . Cardiovascular stress test  08-24-2012    abnormal nuclear study/  inferolateral and anteroseptal areas of scar,  no ischemia/  normal LV function and wall motion, ef 77%  . Orif patella Left 10/31/2015    Procedure: OPEN REDUCTION INTERNAL (ORIF) FIXATION LEFT PATELLA;  Surgeon: Rod Can, MD;  Location: [place];  Service: Orthopedics;  Laterality: Left;   Social History   Social History  . Marital Status: Married    Spouse Name: N/A  . Number of Children: N/A  . Years of Education: N/A   Social History Main Topics  . Smoking status: Never Smoker   . Smokeless tobacco: Never Used  . Alcohol Use: No     Comment: quit 2007  . Drug  Use: No  . Sexual Activity: Not Asked   Other Topics Concern  . None   Social History Narrative   Family History  Problem Relation Age of Onset  . Hypertension Father   . Thyroid disease Father   . Hypertension Sister   . Thyroid disease Sister    Allergies  Allergen Reactions  . Lisinopril Swelling and Other (See Comments)    "made my chest hurt"  . Norvasc [Amlodipine Besylate] Swelling   Prior to Admission medications   Medication Sig Start Date End Date Taking? Authorizing Provider  aspirin 81 MG tablet Take 1 tablet (81 mg total) by mouth 2 (two) times daily after a meal. Reported on 10/02/2015 10/31/15  Yes Rod Can,  MD  calcium acetate (PHOSLO) 667 MG capsule Take 667 mg by mouth 3 (three) times daily with meals.   Yes Historical Provider, MD  clindamycin (CLEOCIN) 300 MG capsule One pill qid 11/16/15  Yes Milton Ferguson, MD  doxycycline (VIBRAMYCIN) 100 MG capsule Take 100 mg by mouth 2 (two) times daily. 14 day course started 11/14/15   Yes Historical Provider, MD  lactobacillus acidophilus & bulgar (LACTINEX) chewable tablet Chew 1 tablet by mouth 3 (three) times daily with meals. 11/16/15  Yes Milton Ferguson, MD  omeprazole (PRILOSEC) 20 MG capsule Take 1 capsule (20 mg total) by mouth daily. 10/17/15  Yes Jola Schmidt, MD  ondansetron (ZOFRAN) 4 MG tablet Take 1 tablet (4 mg total) by mouth every 8 (eight) hours as needed for nausea or vomiting. 10/31/15  Yes Rod Can, MD  oxyCODONE (ROXICODONE) 5 MG immediate release tablet Take 1-2 tablets (5-10 mg total) by mouth every 3 (three) hours as needed for severe pain. 10/31/15  Yes Rod Can, MD  senna (SENOKOT) 8.6 MG TABS tablet Take 2 tablets (17.2 mg total) by mouth at bedtime. 10/31/15  Yes Rod Can, MD  predniSONE (STERAPRED UNI-PAK 21 TAB) 10 MG (21) TBPK tablet Take 1 tablet (10 mg total) by mouth daily. Take 6 tabs by mouth daily  for 2 days, then 5 tabs for 2 days, then 4 tabs for 2 days, then 3 tabs for 2 days, 2 tabs for 2 days, then 1 tab by mouth daily for 2 days 11/16/15   Milton Ferguson, MD     Positive ROS: All other systems have been reviewed and were otherwise negative with the exception of those mentioned in the HPI and as above.  Physical Exam: General: Alert, no acute distress Cardiovascular: No pedal edema Respiratory: No cyanosis, no use of accessory musculature GI: No organomegaly, abdomen is soft and non-tender Skin: No lesions in the area of chief complaint Neurologic: Sensation intact distally Psychiatric: Patient is competent for consent with normal mood and affect Lymphatic: No axillary or cervical  lymphadenopathy  MUSCULOSKELETAL: LLE: knee wound with dehiscence 4 x 2 cm. Skin edges necrotic. No pus or erythema.  Assessment: WOUND DEHISCENCE LEFT KNEE   Plan: Plan for Procedure(s): IRRIGATION AND DEBRIDEMENT LEFT KNEE WOUND  APPLICATION OF WOUND VAC  The risks benefits and alternatives were discussed with the patient including but not limited to the risks of nonoperative treatment, versus surgical intervention including infection, bleeding, nerve injury,  blood clots, cardiopulmonary complications, morbidity, mortality, among others, and they were willing to proceed.   Tequia Wolman, Horald Pollen, MD Cell [phone]   11/28/2015 6:46 AM

## 2015-11-28 NOTE — Op Note (Signed)
NAMESUSAN, DUSEL                ACCOUNT NO.:  [id number]  MEDICAL RECORD NO.:  [id number]  LOCATION:  [facility]                         FACILITY:  [facility]  PHYSICIAN:  Rod Can, MD     DATE OF BIRTH:  July 14, 1956  DATE OF PROCEDURE:  11/28/2015 DATE OF DISCHARGE:                              OPERATIVE REPORT   PREOPERATIVE DIAGNOSIS:  Dehiscence of left knee surgical wound.  POSTOPERATIVE DIAGNOSIS:  Dehiscence of left knee surgical wound.  PROCEDURE PERFORMED: 1. Debridement of left knee wound including skin, subcutaneous     tissues. 2. Application of incisional wound VAC.  ANESTHESIA:  General.  EBL:  Less than 20 mL.  ANTIBIOTICS:  2 g Ancef.  SPECIMENS:  Left knee wound,luid for culture.  TUBES AND DRAINS:  Incisional wound VAC at 75 mmHg.  COMPLICATIONS:  None.  DISPOSITION:  Stable to PACU.  INDICATIONS:  The patient is a 60 year old female with multiple medical problems, including hypertension and end-stage renal disease on hemodialysis Monday, Wednesday, Friday.  She underwent recent left patellar tendon repair and subsequently developed wound dehiscence over the central part of her incision.  Risks, benefits, alternatives to debridement of her wound were explained and she elected to proceed.  DESCRIPTION OF PROCEDURE IN DETAIL:  The patient was identified in the holding area using 2 identifiers.  I marked the surgical site.  She was taken to the operating room, placed supine on the operating room table. General anesthesia was induced.  No tourniquet was applied.  Left lower extremity was prepped and draped in normal sterile surgical fashion. Time-out was called verifying side and site of surgery.  She received IV antibiotics within 60 minutes of beginning the procedure.  I began by inspecting her knee wound.  The inferior and superior aspects of the wound had completely healed.  In the central aspect of the wound, she had a 4 x 2 cm full-thickness skin  dehiscence.  The skin edges were necrotic.  There was no purulence.  She had some mild serosanguineous drainage on her dressing that was dried.  I began by using a 15 blade to sharply debride skin and subcutaneous tissues.  I extended her incision a little proximally and distally.  Her patellar tendon repair underneath was completely intact.  The fascia was intact.  The knee joint was not exposed.  I identified her FiberWire sutures which I chose to leave intact.  I copiously irrigated the wound with 3 L of saline.  The superior and inferior aspects of the wound I repaired with interrupted 2- 0 Monocryl sutures followed by 2-0 nylon vertical mattress technique, central aspect of the incision, I used [place]-style trauma stitch to approximate the wound edges.  The wound was closed without any undue tension.  I augmented the inferior and superior wound closures with staples.  Incisional wound VAC was applied with Adaptic black VAC sponge and adhesive strips.  The VAC was connected to 75 mmHg with good seal. She was awakened from anesthesia after a knee immobilizer was applied, taken to PACU in stable condition.  Sponge, needle, and instrument counts were correct at the end of the case x2.  There were no known  complications.  The patient will be admitted to the hospital.  We will watch her cultures.  We will keep her on IV antibiotics until the cultures come back.  She may weightbear as tolerated and in knee immobilizer.  She should be in the immobilizer at all times.  All questions solicited and answered.          ______________________________ Rod Can, MD     BS/MEDQ  D:  11/28/2015  T:  11/28/2015  Job:  [id number]

## 2015-11-28 NOTE — Anesthesia Postprocedure Evaluation (Signed)
Anesthesia Post Note  Patient: Victoria [name]  Procedure(s) Performed: Procedure(s) (LRB): IRRIGATION AND DEBRIDEMENT LEFT KNEE WOUND  (Left) APPLICATION OF WOUND VAC (Left)  Patient location during evaluation: PACU Anesthesia Type: General Level of consciousness: sedated Pain management: pain level controlled Vital Signs Assessment: post-procedure vital signs reviewed and stable Respiratory status: spontaneous breathing and respiratory function stable Cardiovascular status: stable Anesthetic complications: no    Last Vitals:  Filed Vitals:   11/28/15 1000 11/28/15 1015  BP: 115/76 109/80  Pulse: 78 77  Temp: 36.6 C   Resp: 12 22    Last Pain:  Filed Vitals:   11/28/15 1018  PainSc: Asleep                 Leeloo Silverthorne DANIEL

## 2015-11-28 NOTE — Brief Op Note (Signed)
11/28/2015  8:48 AM  PATIENT:  Victoria [name]  60 y.o. female  PRE-OPERATIVE DIAGNOSIS:  WOUND DEHISCENCE LEFT KNEE   POST-OPERATIVE DIAGNOSIS:  WOUND DEHISCENCE LEFT KNE  PROCEDURE:  Procedure(s): IRRIGATION AND DEBRIDEMENT LEFT KNEE WOUND  (Left) APPLICATION OF WOUND VAC (Left)  SURGEON:  Surgeon(s) and Role:    * Rod Can, MD - Primary  PHYSICIAN ASSISTANT: none  ASSISTANTS: none   ANESTHESIA:   general  EBL:  Total I/O In: 500 [I.V.:500] Out: 50 [Blood:50]  BLOOD ADMINISTERED:none  DRAINS: incisional wound VAC   LOCAL MEDICATIONS USED:  NONE  SPECIMEN:  Source of Specimen:  L knee wound  DISPOSITION OF SPECIMEN:  micro  COUNTS:  YES  TOURNIQUET:  * No tourniquets in log *  DICTATION: .Other Dictation: Dictation Number [id number]  PLAN OF CARE: Admit to inpatient   PATIENT DISPOSITION:  PACU - hemodynamically stable.   Delay start of Pharmacological VTE agent (>24hrs) due to surgical blood loss or risk of bleeding: no

## 2015-11-28 NOTE — Transfer of Care (Signed)
Immediate Anesthesia Transfer of Care Note  Patient: Victoria [name]  Procedure(s) Performed: Procedure(s): IRRIGATION AND DEBRIDEMENT LEFT KNEE WOUND  (Left) APPLICATION OF WOUND VAC (Left)  Patient Location: PACU  Anesthesia Type:General  Level of Consciousness: awake, alert  and oriented  Airway & Oxygen Therapy: Patient Spontanous Breathing and Patient connected to nasal cannula oxygen  Post-op Assessment: Report given to RN  Post vital signs: Reviewed and stable  Last Vitals:  Filed Vitals:   11/28/15 0856 11/28/15 0900  BP:  116/77  Pulse: 66   Temp: 36.4 C   Resp: 19 30    Complications: No apparent anesthesia complications

## 2015-11-28 NOTE — Progress Notes (Signed)
Pharmacy Antibiotic Note Victoria [name] is a 60 y.o. female admitted on 11/28/2015 with L knee wound dehiscence s/p revision on 3/10. Pharmacy has been consulted for Zosyn and vancomycin dosing.  Plan: 1. Zosyn 2.25 grams IV every 8 hours every 30 minutes 2. Vancomycin 1500 mg IV x 1 now; will follow up IHD schedule (? MWF as outpatient) and schedule subsequent doses accordingly 3. Await cx data and adjust abx as feasible 4. Following along with you daily   Height: 5' 3.5" (161.3 cm) Weight: 199 lb 8.3 oz (90.5 kg) IBW/kg (Calculated) : 53.55  Temp (24hrs), Avg:97.9 F (36.6 C), Min:97.5 F (36.4 C), Max:98.6 F (37 C)   Recent Labs Lab 11/28/15 0629  WBC 10.2  CREATININE 11.82*    Antimicrobials this admission: 3/10 Zosyn >>  3/10 Vancomycin  >>   Dose adjustments this admission: n/a  Microbiology results: 3/10 wound: px  Thank you for allowing pharmacy to be a part of this patient's care.  Vincenza Hews, PharmD, BCPS 11/28/2015, 2:39 PM Pager: [phone]

## 2015-11-29 LAB — RENAL FUNCTION PANEL
Albumin: 2.7 g/dL — ABNORMAL LOW (ref 3.5–5.0)
Anion gap: 16 — ABNORMAL HIGH (ref 5–15)
BUN: 61 mg/dL — AB (ref 6–20)
CHLORIDE: 99 mmol/L — AB (ref 101–111)
CO2: 20 mmol/L — ABNORMAL LOW (ref 22–32)
Calcium: 7.6 mg/dL — ABNORMAL LOW (ref 8.9–10.3)
Creatinine, Ser: 13.04 mg/dL — ABNORMAL HIGH (ref 0.44–1.00)
GFR calc Af Amer: 3 mL/min — ABNORMAL LOW (ref >60)
GFR, EST NON AFRICAN AMERICAN: 3 mL/min — AB (ref >60)
GLUCOSE: 121 mg/dL — AB (ref 65–99)
POTASSIUM: 4.9 mmol/L (ref 3.5–5.1)
Phosphorus: 4.9 mg/dL — ABNORMAL HIGH (ref 2.5–4.6)
Sodium: 135 mmol/L (ref 135–145)

## 2015-11-29 LAB — CBC
HEMATOCRIT: 31.2 % — AB (ref 36.0–46.0)
HEMATOCRIT: 33.9 % — AB (ref 36.0–46.0)
Hemoglobin: 9.8 g/dL — ABNORMAL LOW (ref 12.0–15.0)
Hemoglobin: 10.9 g/dL — ABNORMAL LOW (ref 12.0–15.0)
MCH: 26.5 pg (ref 26.0–34.0)
MCH: 26.9 pg (ref 26.0–34.0)
MCHC: 31.4 g/dL (ref 30.0–36.0)
MCHC: 32.2 g/dL (ref 30.0–36.0)
MCV: 84.3 fL (ref 78.0–100.0)
MCV: 83.7 fL (ref 78.0–100.0)
Platelets: 236 10*3/uL (ref 150–400)
Platelets: 252 10*3/uL (ref 150–400)
RBC: 3.7 MIL/uL — ABNORMAL LOW (ref 3.87–5.11)
RBC: 4.05 MIL/uL (ref 3.87–5.11)
RDW: 15.5 % (ref 11.5–15.5)
RDW: 15.4 % (ref 11.5–15.5)
WBC: 11.6 10*3/uL — AB (ref 4.0–10.5)
WBC: 13.5 10*3/uL — AB (ref 4.0–10.5)

## 2015-11-29 LAB — BASIC METABOLIC PANEL
Anion gap: 11 (ref 5–15)
BUN: 54 mg/dL — ABNORMAL HIGH (ref 6–20)
CALCIUM: 6.9 mg/dL — AB (ref 8.9–10.3)
CO2: 24 mmol/L (ref 22–32)
CREATININE: 11.56 mg/dL — AB (ref 0.44–1.00)
Chloride: 109 mmol/L (ref 101–111)
GFR calc Af Amer: 4 mL/min — ABNORMAL LOW (ref >60)
GFR calc non Af Amer: 3 mL/min — ABNORMAL LOW (ref >60)
GLUCOSE: 101 mg/dL — AB (ref 65–99)
Potassium: 4.3 mmol/L (ref 3.5–5.1)
Sodium: 144 mmol/L (ref 135–145)

## 2015-11-29 MED ORDER — RENA-VITE PO TABS
1.0000 | ORAL_TABLET | Freq: Every day | ORAL | Status: DC
  Administered 2015-11-29: 1 via ORAL
  Filled 2015-11-29 (×2): qty 1

## 2015-11-29 MED ORDER — DARBEPOETIN ALFA 100 MCG/0.5ML IJ SOSY
100.0000 ug | PREFILLED_SYRINGE | Freq: Once | INTRAMUSCULAR | Status: AC
  Administered 2015-11-29: 100 ug via INTRAVENOUS

## 2015-11-29 MED ORDER — DARBEPOETIN ALFA 100 MCG/0.5ML IJ SOSY
PREFILLED_SYRINGE | INTRAMUSCULAR | Status: AC
  Filled 2015-11-29: qty 0.5

## 2015-11-29 MED ORDER — VANCOMYCIN HCL IN DEXTROSE 1-5 GM/200ML-% IV SOLN
1000.0000 mg | INTRAVENOUS | Status: DC
  Administered 2015-12-01: 1000 mg via INTRAVENOUS
  Filled 2015-11-29: qty 200

## 2015-11-29 MED ORDER — VANCOMYCIN HCL IN DEXTROSE 1-5 GM/200ML-% IV SOLN
1000.0000 mg | Freq: Once | INTRAVENOUS | Status: AC
  Administered 2015-11-29: 1000 mg via INTRAVENOUS

## 2015-11-29 MED ORDER — VANCOMYCIN HCL IN DEXTROSE 1-5 GM/200ML-% IV SOLN
INTRAVENOUS | Status: AC
  Filled 2015-11-29: qty 200

## 2015-11-29 NOTE — Progress Notes (Signed)
Physical Therapy Treatment Patient Details Name: Victoria [name] MRN: [id number] DOB: 1956/01/30 Today's Date: 11/29/2015    History of Present Illness 60 y.o. female s/p IRRIGATION AND DEBRIDEMENT LEFT KNEE WOUND with application of wound vac. Hx of ESRD on hemodialysis .    PT Comments    Pt was at HD this afternoon upon checking on patient initial attempt.  Spoke with pt after she returned late from HD.  She did not wish to mobilize with PT.  Provided written HEP after review and demo to pt.  She requests PT return in am.  Will continue to follow patient while on this venue of care to progress mobility.   Follow Up Recommendations  Outpatient PT     Equipment Recommendations  None recommended by PT    Recommendations for Other Services       Precautions / Restrictions Precautions Precautions: Knee Precaution Comments: DO NOT FLEX LEFT KNEE Required Braces or Orthoses: Knee Immobilizer - Left Knee Immobilizer - Left: On at all times Restrictions Weight Bearing Restrictions: Yes LLE Weight Bearing: Weight bearing as tolerated Other Position/Activity Restrictions: DO NOT FLEX LEFT KNEE    Mobility  Bed Mobility                  Transfers                    Ambulation/Gait                 Stairs            Wheelchair Mobility    Modified Rankin (Stroke Patients Only)       Balance                                    Cognition Arousal/Alertness: Awake/alert Behavior During Therapy: WFL for tasks assessed/performed Overall Cognitive Status: Within Functional Limits for tasks assessed                      Exercises General Exercises - Lower Extremity Ankle Circles/Pumps: AROM;Both;10 reps;Supine Quad Sets: AROM;Left;5 reps;Supine Straight Leg Raises: Other (comment) (instructed how to perform AAROM)    General Comments General comments (skin integrity, edema, etc.): educated on HEP and importance of  mobility      Pertinent Vitals/Pain Pain Assessment: No/denies pain    Home Living                      Prior Function            PT Goals (current goals can now be found in the care plan section) Acute Rehab PT Goals Patient Stated Goal: get stronger and go home Monday PT Goal Formulation: With patient Time For Goal Achievement: 12/12/15 Potential to Achieve Goals: Good Progress towards PT goals: Progressing toward goals (mobilized with nursing earlier today per pt report)    Frequency  Min 3X/week    PT Plan Current plan remains appropriate    Co-evaluation             End of Session Equipment Utilized During Treatment: Left knee immobilizer Activity Tolerance: Patient tolerated treatment well Patient left: in bed;with call bell/phone within reach     Time: 1824-1840 PT Time Calculation (min) (ACUTE ONLY): 16 min  Charges:  $Self Care/Home Management: 8-22  G CodesMalka So, PT [phone]  [place] 11/29/2015, 6:45 PM

## 2015-11-29 NOTE — Consult Note (Signed)
[facility] Renal Consultation Note  Indication for Consultation:  Management of ESRD/hemodialysis; anemia, hypertension/volume and secondary hyperparathyroidism  HPI: Victoria [name] is a 60 y.o. female ho recent L Patellar tendon repair admitted   With  L Knee wound Dehiscence/  yesterday Dr. Lyla Glassing  Debridement and  application of incisional wound VAC.On Chronic HD MWF  [facility]/ compliant wit HD. This am reports no cos . Denies fevers, sob , chest pain , N/V/D abd. Pain  And reports Knee discomfort improved sp surgery. "Ready for HD today"        Past Medical History  Diagnosis Date  . Secondary hyperparathyroidism, renal ([place])     s/p  total parathyroidectomy  2014  . Hypertension   . History of acute pulmonary edema     2003  . Left patella fracture   . Wears glasses   . Complication of anesthesia ~ 2011    "they gave me a medicine that swolled me and mouth burning up" (08/23/2012)  . Seasonal allergies   . Chronic bronchitis ([place])   . ESRD (end stage renal disease) on dialysis [facility]) Nephrologist-- dr deterding    ESRD due to HTN-; "M/W/F; [place]" (11/28/2015    Past Surgical History  Procedure Laterality Date  . Dialysis fistula creation  1992    left upper arm ---  w/  Multiple Revision's until 2006  . Revision of arteriovenous goretex graft  09/27/2012    Procedure: REVISION OF ARTERIOVENOUS GORETEX GRAFT;  Surgeon: Mal Misty, MD;  Location: [facility] OR;  Service: Vascular;  Laterality: Right;  1) Replacement of venous half of loop with 42m Gortex graft  2) Excision of erroded pseudoaneurysm of graft with primary closure.  . Patellectomy  10/03/2012    Procedure: PATELLECTOMY;  Surgeon: KMarin Shutter MD;  [place] [facility]  Service: Orthopedics;  Laterality: Right;  RIGHT PARTIAL PATELLECTOMY AND PATELLA TENDON REPAIR  . Patellar tendon repair  10/03/2012    Procedure: PATELLA TENDON REPAIR;  Surgeon: KMarin Shutter MD;  [place]  Service:  Orthopedics;  Laterality: Right;  . Revision of arteriovenous goretex graft Right 01/23/2013    Procedure: REVISION OF ARTERIOVENOUS GORETEX GRAFT;  Surgeon: BConrad  MD;  [place] [facility]  Service: Vascular;  Laterality: Right;  Using piece of 677mx 20cm Gortex graft.   . Revision of arteriovenous goretex graft Right 10/07/2015    Procedure: REVISION OF Right arm ARTERIOVENOUS GORETEX GRAFT;  Surgeon: ChElam DutchMD;  [facility] Service: Vascular;  Laterality: Right;  . Total parathyroidectomy/  thyroid isthmusectomy/  autotransplantation parathyroid tissue to left brachioriadialis muscle  02-18-2011  . Ankle fracture surgery Right ~ 2005    "has pins in it"  . Ectopic pregnancy surgery  1970's  . Arteriovenous graft placement  03-25-2005    Right forearm  w/  multiple Revision's   . Transthoracic echocardiogram  10-31-2012    mild LVH,  grade 1 diastolic dysfunction,  ef 55-65%/  mild MR/  trivial TR  . Cardiovascular stress test  08-24-2012    abnormal nuclear study/  inferolateral and anteroseptal areas of scar,  no ischemia/  normal LV function and wall motion, ef 77%  . Orif patella Left 10/31/2015    Procedure: OPEN REDUCTION INTERNAL (ORIF) FIXATION LEFT PATELLA;  Surgeon: BrRod CanMD;  [place] [facility] Service: Orthopedics;  Laterality: Left;  . Incision and drainage of wound Left 11/28/2015  knee  . Application of wound vac Left 11/28/2015    knee  . Total abdominal hysterectomy  03-05-2005    w/  Right Ovarian Cystectomy  . Fracture surgery        Family History  Problem Relation Age of Onset  . Hypertension Father   . Thyroid disease Father   . Hypertension Sister   . Thyroid disease Sister       reports that she has never smoked. She has never used smokeless tobacco. She reports that she drinks alcohol. She reports that she does not use illicit drugs.   Allergies  Allergen Reactions  . Lisinopril Swelling and Other (See  Comments)    "made my chest hurt"  . Norvasc [Amlodipine Besylate] Swelling    Prior to Admission medications   Medication Sig Start Date End Date Taking? Authorizing Provider  aspirin 81 MG tablet Take 1 tablet (81 mg total) by mouth 2 (two) times daily after a meal. Reported on 10/02/2015 10/31/15  Yes Rod Can, MD  calcium acetate (PHOSLO) 667 MG capsule Take 667 mg by mouth 3 (three) times daily with meals.   Yes Historical Provider, MD  clindamycin (CLEOCIN) 300 MG capsule One pill qid 11/16/15  Yes Milton Ferguson, MD  doxycycline (VIBRAMYCIN) 100 MG capsule Take 100 mg by mouth 2 (two) times daily. 14 day course started 11/14/15   Yes Historical Provider, MD  lactobacillus acidophilus & bulgar (LACTINEX) chewable tablet Chew 1 tablet by mouth 3 (three) times daily with meals. 11/16/15  Yes Milton Ferguson, MD  omeprazole (PRILOSEC) 20 MG capsule Take 1 capsule (20 mg total) by mouth daily. 10/17/15  Yes Jola Schmidt, MD  ondansetron (ZOFRAN) 4 MG tablet Take 1 tablet (4 mg total) by mouth every 8 (eight) hours as needed for nausea or vomiting. 10/31/15  Yes Rod Can, MD  oxyCODONE (ROXICODONE) 5 MG immediate release tablet Take 1-2 tablets (5-10 mg total) by mouth every 3 (three) hours as needed for severe pain. 10/31/15  Yes Rod Can, MD  senna (SENOKOT) 8.6 MG TABS tablet Take 2 tablets (17.2 mg total) by mouth at bedtime. 10/31/15  Yes Rod Can, MD  predniSONE (STERAPRED UNI-PAK 21 TAB) 10 MG (21) TBPK tablet Take 1 tablet (10 mg total) by mouth daily. Take 6 tabs by mouth daily  for 2 days, then 5 tabs for 2 days, then 4 tabs for 2 days, then 3 tabs for 2 days, 2 tabs for 2 days, then 1 tab by mouth daily for 2 days 11/16/15   Milton Ferguson, MD     Anti-infectives    Start     Dose/Rate Route Frequency Ordered Stop   11/28/15 1445  vancomycin (VANCOCIN) 1,500 mg in sodium chloride 0.9 % 500 mL IVPB     1,500 mg 250 mL/hr over 120 Minutes Intravenous  Once 11/28/15 1431  11/28/15 1729   11/28/15 1430  piperacillin-tazobactam (ZOSYN) IVPB 2.25 g     2.25 g 100 mL/hr over 30 Minutes Intravenous 3 times per day 11/28/15 1429     11/28/15 0630  ceFAZolin (ANCEF) IVPB 2 g/50 mL premix     2 g 100 mL/hr over 30 Minutes Intravenous On call to O.R. 11/28/15 0617 11/28/15 0726      Results for orders placed or performed during the hospital encounter of 11/28/15 (from the past 48 hour(s))  CBC WITH DIFFERENTIAL     Status: Abnormal   Collection Time: 11/28/15  6:29 AM  Result Value Ref Range  WBC 10.2 4.0 - 10.5 K/uL   RBC 4.33 3.87 - 5.11 MIL/uL   Hemoglobin 11.4 (L) 12.0 - 15.0 g/dL   HCT 36.1 36.0 - 46.0 %   MCV 83.4 78.0 - 100.0 fL   MCH 26.3 26.0 - 34.0 pg   MCHC 31.6 30.0 - 36.0 g/dL   RDW 15.3 11.5 - 15.5 %   Platelets 250 150 - 400 K/uL   Neutrophils Relative % 70 %   Neutro Abs 7.1 1.7 - 7.7 K/uL   Lymphocytes Relative 17 %   Lymphs Abs 1.7 0.7 - 4.0 K/uL   Monocytes Relative 8 %   Monocytes Absolute 0.8 0.1 - 1.0 K/uL   Eosinophils Relative 4 %   Eosinophils Absolute 0.4 0.0 - 0.7 K/uL   Basophils Relative 1 %   Basophils Absolute 0.1 0.0 - 0.1 K/uL  Comprehensive metabolic panel     Status: Abnormal   Collection Time: 11/28/15  6:29 AM  Result Value Ref Range   Sodium 143 135 - 145 mmol/L   Potassium 4.2 3.5 - 5.1 mmol/L   Chloride 103 101 - 111 mmol/L   CO2 25 22 - 32 mmol/L   Glucose, Bld 89 65 - 99 mg/dL   BUN 53 (H) 6 - 20 mg/dL   Creatinine, Ser 11.82 (H) 0.44 - 1.00 mg/dL   Calcium 8.8 (L) 8.9 - 10.3 mg/dL   Total Protein 5.5 (L) 6.5 - 8.1 g/dL   Albumin 2.7 (L) 3.5 - 5.0 g/dL   AST 14 (L) 15 - 41 U/L   ALT 9 (L) 14 - 54 U/L   Alkaline Phosphatase 45 38 - 126 U/L   Total Bilirubin 0.4 0.3 - 1.2 mg/dL   GFR calc non Af Amer 3 (L) >60 mL/min   GFR calc Af Amer 4 (L) >60 mL/min    Comment: (NOTE) The eGFR has been calculated using the CKD EPI equation. This calculation has not been validated in all clinical  situations. eGFR's persistently <60 mL/min signify possible Chronic Kidney Disease.    Anion gap 15 5 - 15  Protime-INR     Status: None   Collection Time: 11/28/15  6:29 AM  Result Value Ref Range   Prothrombin Time 14.1 11.6 - 15.2 seconds   INR 1.07 0.00 - 1.49  APTT     Status: None   Collection Time: 11/28/15  6:29 AM  Result Value Ref Range   aPTT 35 24 - 37 seconds  Anaerobic culture     Status: None (Preliminary result)   Collection Time: 11/28/15  8:05 AM  Result Value Ref Range   Specimen Description WOUND LEFT KNEE    Special Requests POF ANCEFT    Gram Stain      RARE WBC PRESENT, PREDOMINANTLY PMN NO SQUAMOUS EPITHELIAL CELLS SEEN NO ORGANISMS SEEN Performed at [facility]    Culture PENDING    Report Status PENDING   Wound culture     Status: None (Preliminary result)   Collection Time: 11/28/15  8:05 AM  Result Value Ref Range   Specimen Description WOUND LEFT KNEE    Special Requests NONE    Gram Stain      RARE WBC PRESENT, PREDOMINANTLY PMN NO SQUAMOUS EPITHELIAL CELLS SEEN NO ORGANISMS SEEN Performed at [facility]    Culture PENDING    Report Status PENDING   CBC     Status: Abnormal   Collection Time: 11/29/15  5:41 AM  Result Value Ref  Range   WBC 11.6 (H) 4.0 - 10.5 K/uL   RBC 3.70 (L) 3.87 - 5.11 MIL/uL   Hemoglobin 9.8 (L) 12.0 - 15.0 g/dL   HCT 31.2 (L) 36.0 - 46.0 %   MCV 84.3 78.0 - 100.0 fL   MCH 26.5 26.0 - 34.0 pg   MCHC 31.4 30.0 - 36.0 g/dL   RDW 15.5 11.5 - 15.5 %   Platelets 236 150 - 400 K/uL  Basic metabolic panel     Status: Abnormal   Collection Time: 11/29/15  5:41 AM  Result Value Ref Range   Sodium 144 135 - 145 mmol/L   Potassium 4.3 3.5 - 5.1 mmol/L   Chloride 109 101 - 111 mmol/L   CO2 24 22 - 32 mmol/L   Glucose, Bld 101 (H) 65 - 99 mg/dL   BUN 54 (H) 6 - 20 mg/dL   Creatinine, Ser 11.56 (H) 0.44 - 1.00 mg/dL   Calcium 6.9 (L) 8.9 - 10.3 mg/dL   GFR calc non Af Amer 3 (L) >60 mL/min   GFR  calc Af Amer 4 (L) >60 mL/min    Comment: (NOTE) The eGFR has been calculated using the CKD EPI equation. This calculation has not been validated in all clinical situations. eGFR's persistently <60 mL/min signify possible Chronic Kidney Disease.    Anion gap 11 5 - 15    ROS: see above hpi   Physical Exam: Filed Vitals:   11/28/15 2009 11/29/15 0454  BP: 125/73 90/54  Pulse: 78 85  Temp: 98.3 F (36.8 C) 97.9 F (36.6 C)  Resp: 18 18     General: alert/ Ox3/ AA F /appropriate HEENT: [place]/ EOMI / MMM Neck: supple , no jvd Heart: RRR, no mur, rub or gallop Lungs: CTA  bilat , non labored breathing  Abdomen: obese soft  NT, ND Extremities: no pedal edema/ L  Lower extrem in Brace Skin:  nop overt rash , warm dry Neuro: )X3 moves all extrem . No overt focal deficits Dialysis Access: Pos bruit R FA AVGG  Dialysis Orders: Center: [facility]  on MWF . EDW 87.5  HD Bath 2k/3.5 Ca   Time 3.5 hrs Heparin 2750. Access R FA AVG BFR 450 DFR 800      Other op labs HGB  12.9  Ca 9.6 phos 4.4  pth 115  Assessment/Plan 1. L Knees wound Dehiscence sp Debridment/ wound VAc-  Per Ortho/ no hep today on hd and Tight  Hep after 2. ESRD -  HD MWF  Today and then Monday if here 3. Hypertension/volume  - 3 kg over edw by wt bp stable /no op bp meds 4. Anemia  -  Hgb down  Post op  To 9.6 start Aranesp  Today then next  On wed  5. Metabolic bone disease - On added Ca bath  On hd  / po calcium acetate binders ac  6. Nutrition -  Renal vit. Neysa Hotter vit   Ernest Haber, PA-C [place] [phone] 11/29/2015, 9:51 AM    Pt seen, examined and agree w A/P as above.  Kelly Splinter MD [employer] pager [phone]    cell [phone] 11/30/2015, 12:32 PM

## 2015-11-29 NOTE — Progress Notes (Signed)
Orthopedics Progress Note  Subjective: No complaints specific to the left knee.  Patient asking about dialysis  Objective:  Filed Vitals:   11/28/15 2009 11/29/15 0454  BP: 125/73 90/54  Pulse: 78 85  Temp: 98.3 F (36.8 C) 97.9 F (36.6 C)  Resp: 18 18    General: Awake and alert  Musculoskeletal: left knee dressing CDI, no swelling, Wound Vac in place and functioning well Neurovascularly intact  Lab Results  Component Value Date   WBC 10.2 11/28/2015   HGB 11.4* 11/28/2015   HCT 36.1 11/28/2015   MCV 83.4 11/28/2015   PLT 250 11/28/2015       Component Value Date/Time   NA 143 11/28/2015 0629   K 4.2 11/28/2015 0629   CL 103 11/28/2015 0629   CO2 25 11/28/2015 0629   GLUCOSE 89 11/28/2015 0629   BUN 53* 11/28/2015 0629   CREATININE 11.82* 11/28/2015 0629   CALCIUM 8.8* 11/28/2015 0629   GFRNONAA 3* 11/28/2015 0629   GFRAA 4* 11/28/2015 0629    Lab Results  Component Value Date   INR 1.07 11/28/2015   INR 0.96 09/27/2012   INR 0.90 02/18/2011    Assessment/Plan:  s/p Procedure(s): IRRIGATION AND DEBRIDEMENT LEFT KNEE WOUND  APPLICATION OF WOUND VAC Cultures remain negative.  Plan to continue wound vac and IV antibiotics through the weekend with possible D/C Monday Will check on Dialysis  Doran Heater. Veverly Fells, MD 11/29/2015 6:44 AM

## 2015-11-30 LAB — CBC
HEMATOCRIT: 33.4 % — AB (ref 36.0–46.0)
Hemoglobin: 10.7 g/dL — ABNORMAL LOW (ref 12.0–15.0)
MCH: 26.8 pg (ref 26.0–34.0)
MCHC: 32 g/dL (ref 30.0–36.0)
MCV: 83.7 fL (ref 78.0–100.0)
PLATELETS: 191 10*3/uL (ref 150–400)
RBC: 3.99 MIL/uL (ref 3.87–5.11)
RDW: 15.4 % (ref 11.5–15.5)
WBC: 11.6 10*3/uL — ABNORMAL HIGH (ref 4.0–10.5)

## 2015-11-30 LAB — BASIC METABOLIC PANEL
Anion gap: 12 (ref 5–15)
BUN: 30 mg/dL — ABNORMAL HIGH (ref 6–20)
CALCIUM: 7.6 mg/dL — AB (ref 8.9–10.3)
CO2: 28 mmol/L (ref 22–32)
CREATININE: 8.78 mg/dL — AB (ref 0.44–1.00)
Chloride: 99 mmol/L — ABNORMAL LOW (ref 101–111)
GFR calc non Af Amer: 4 mL/min — ABNORMAL LOW (ref >60)
GFR, EST AFRICAN AMERICAN: 5 mL/min — AB (ref >60)
Glucose, Bld: 92 mg/dL (ref 65–99)
Potassium: 3.9 mmol/L (ref 3.5–5.1)
Sodium: 139 mmol/L (ref 135–145)

## 2015-11-30 LAB — WOUND CULTURE: CULTURE: NO GROWTH

## 2015-11-30 NOTE — Progress Notes (Addendum)
  Victoria [name] Progress Note   Subjective: stable  Filed Vitals:   11/29/15 1710 11/29/15 1744 11/29/15 2053 11/30/15 0453  BP: 98/65 91/62 128/81 90/60  Pulse: 95 92 101 97  Temp: 98.2 F (36.8 C) 98.3 F (36.8 C) 98.4 F (36.9 C) 98.2 F (36.8 C)  TempSrc: Oral Oral Oral Oral  Resp: 18 18 18 18   Height:      Weight: 89 kg (196 lb 3.4 oz)     SpO2: 98% 100% 100% 100%    Inpatient medications: . calcium acetate  667 mg Oral TID WC  . lactobacillus acidophilus & bulgar  1 tablet Oral TID WC  . multivitamin  1 tablet Oral QHS  . pantoprazole  40 mg Oral Daily  . piperacillin-tazobactam (ZOSYN)  IV  2.25 g Intravenous 3 times per day  . [START ON 12/01/2015] vancomycin  1,000 mg Intravenous Q M,W,F-HD   . sodium chloride Stopped (11/29/15 1130)   acetaminophen **OR** acetaminophen, metoCLOPramide **OR** metoCLOPramide (REGLAN) injection, ondansetron **OR** ondansetron (ZOFRAN) IV, oxyCODONE  Exam: No distress HEENT: [place]/ EOMI / MMM Neck: supple , no jvd Heart: RRR, no mur, rub or gallop Lungs: CTA bilat , non labored breathing  Abdomen: obese soft NT, ND Extremities: no pedal edema/ L Lower extrem in Brace Skin: nop overt rash , warm dry Neuro: )X3 moves all extrem . No overt focal deficits Dialysis Access: Pos bruit R FA AVGG  Dialysis Orders: Center: [facility] on MWF . EDW 87.5 HD Bath 2k/3.5 Ca Time 3.5 hrs Heparin 2750. Access R FA AVG BFR 450 DFR 800   Other op labs HGB 12.9 Ca 9.6 phos 4.4 pth 115  Assessment/Plan 1. L Knees wound Dehiscence sp Debridment/ wound VAc- Per Ortho 2. ESRD - HD MWF, tight hep postop 3. Hypertension/volume - 1-2 over edw by wt bp stable /no op bp meds 4. Anemia - Hgb down Post op To 9.6 start Aranesp Today then next On wed  5. Metabolic bone disease - On added Ca bath On hd / po calcium acetate binders ac  6. Nutrition - Renal vit. Neysa Hotter vit  Plan - HD Monday first shift.  Prob dc soon.     Kelly Splinter MD [name] pager [phone]    cell [phone] 11/30/2015, 12:32 PM    Recent Labs Lab 11/29/15 0541 11/29/15 1401 11/30/15 0535  NA 144 135 139  K 4.3 4.9 3.9  CL 109 99* 99*  CO2 24 20* 28  GLUCOSE 101* 121* 92  BUN 54* 61* 30*  CREATININE 11.56* 13.04* 8.78*  CALCIUM 6.9* 7.6* 7.6*  PHOS  --  4.9*  --     Recent Labs Lab 11/28/15 0629 11/29/15 1401  AST 14*  --   ALT 9*  --   ALKPHOS 45  --   BILITOT 0.4  --   PROT 5.5*  --   ALBUMIN 2.7* 2.7*    Recent Labs Lab 11/28/15 0629 11/29/15 0541 11/29/15 1406 11/30/15 0535  WBC 10.2 11.6* 13.5* 11.6*  NEUTROABS 7.1  --   --   --   HGB 11.4* 9.8* 10.9* 10.7*  HCT 36.1 31.2* 33.9* 33.4*  MCV 83.4 84.3 83.7 83.7  PLT 250 236 252 191

## 2015-11-30 NOTE — Progress Notes (Signed)
Orthopedics Progress Note  Subjective: Patient reports minimal pain in the knee and asks about possible discharge tomorrow.  Objective:  Filed Vitals:   11/29/15 2053 11/30/15 0453  BP: 128/81 90/60  Pulse: 101 97  Temp: 98.4 F (36.9 C) 98.2 F (36.8 C)  Resp: 18 18    General: Awake and alert  Musculoskeletal: left knee dressing intact and wound vac functioning properly Neurovascularly intact  Lab Results  Component Value Date   WBC 11.6* 11/30/2015   HGB 10.7* 11/30/2015   HCT 33.4* 11/30/2015   MCV 83.7 11/30/2015   PLT 191 11/30/2015       Component Value Date/Time   NA 139 11/30/2015 0535   K 3.9 11/30/2015 0535   CL 99* 11/30/2015 0535   CO2 28 11/30/2015 0535   GLUCOSE 92 11/30/2015 0535   BUN 30* 11/30/2015 0535   CREATININE 8.78* 11/30/2015 0535   CALCIUM 7.6* 11/30/2015 0535   GFRNONAA 4* 11/30/2015 0535   GFRAA 5* 11/30/2015 0535    Lab Results  Component Value Date   INR 1.07 11/28/2015   INR 0.96 09/27/2012   INR 0.90 02/18/2011    Assessment/Plan:  s/p Procedure(s): IRRIGATION AND DEBRIDEMENT LEFT KNEE WOUND  APPLICATION OF WOUND VAC Cultures still negative.  Continue wound vac and Abx  Doran Heater. Veverly Fells, MD 11/30/2015 10:28 AM

## 2015-12-01 ENCOUNTER — Encounter (HOSPITAL_COMMUNITY): Payer: Self-pay | Admitting: Orthopedic Surgery

## 2015-12-01 LAB — BASIC METABOLIC PANEL
Anion gap: 11 (ref 5–15)
BUN: 39 mg/dL — AB (ref 6–20)
CALCIUM: 7.7 mg/dL — AB (ref 8.9–10.3)
CO2: 26 mmol/L (ref 22–32)
CREATININE: 11.49 mg/dL — AB (ref 0.44–1.00)
Chloride: 100 mmol/L — ABNORMAL LOW (ref 101–111)
GFR calc Af Amer: 4 mL/min — ABNORMAL LOW (ref >60)
GFR, EST NON AFRICAN AMERICAN: 3 mL/min — AB (ref >60)
GLUCOSE: 99 mg/dL (ref 65–99)
Potassium: 4.4 mmol/L (ref 3.5–5.1)
Sodium: 137 mmol/L (ref 135–145)

## 2015-12-01 LAB — CBC
HCT: 34.1 % — ABNORMAL LOW (ref 36.0–46.0)
Hemoglobin: 10.9 g/dL — ABNORMAL LOW (ref 12.0–15.0)
MCH: 26.8 pg (ref 26.0–34.0)
MCHC: 32 g/dL (ref 30.0–36.0)
MCV: 83.8 fL (ref 78.0–100.0)
Platelets: 205 10*3/uL (ref 150–400)
RBC: 4.07 MIL/uL (ref 3.87–5.11)
RDW: 15.5 % (ref 11.5–15.5)
WBC: 10.1 10*3/uL (ref 4.0–10.5)

## 2015-12-01 LAB — POCT I-STAT 4, (NA,K, GLUC, HGB,HCT)
GLUCOSE: 85 mg/dL (ref 65–99)
HEMATOCRIT: 38 % (ref 36.0–46.0)
Hemoglobin: 12.9 g/dL (ref 12.0–15.0)
Potassium: 3.9 mmol/L (ref 3.5–5.1)
Sodium: 142 mmol/L (ref 135–145)

## 2015-12-01 MED ORDER — ALTEPLASE 2 MG IJ SOLR
2.0000 mg | Freq: Once | INTRAMUSCULAR | Status: DC | PRN
  Filled 2015-12-01: qty 2

## 2015-12-01 MED ORDER — PENTAFLUOROPROP-TETRAFLUOROETH EX AERO: 1.0000 "application " | INHALATION_SPRAY | CUTANEOUS | Status: DC | PRN

## 2015-12-01 MED ORDER — HEPARIN SODIUM (PORCINE) 1000 UNIT/ML DIALYSIS: 20.0000 [IU]/kg | Freq: Once | INTRAMUSCULAR | Status: DC

## 2015-12-01 MED ORDER — HEPARIN SODIUM (PORCINE) 1000 UNIT/ML DIALYSIS
1000.0000 [IU] | INTRAMUSCULAR | Status: DC | PRN
  Filled 2015-12-01: qty 1

## 2015-12-01 MED ORDER — VANCOMYCIN HCL IN DEXTROSE 1-5 GM/200ML-% IV SOLN
INTRAVENOUS | Status: AC
  Filled 2015-12-01: qty 200

## 2015-12-01 MED ORDER — LIDOCAINE HCL (PF) 1 % IJ SOLN: 5.0000 mL | INTRAMUSCULAR | Status: DC | PRN

## 2015-12-01 MED ORDER — SODIUM CHLORIDE 0.9 % IV SOLN: 100.0000 mL | INTRAVENOUS | Status: DC | PRN

## 2015-12-01 MED ORDER — LIDOCAINE-PRILOCAINE 2.5-2.5 % EX CREA
1.0000 "application " | TOPICAL_CREAM | CUTANEOUS | Status: DC | PRN
  Filled 2015-12-01: qty 5

## 2015-12-01 NOTE — Discharge Summary (Signed)
Physician Discharge Summary  Patient ID: Victoria [name] MRN: [id number] DOB/AGE: 61-Feb-1957 61 y.o.  Admit date: 11/28/2015 Discharge date: 12/01/2015  Admission Diagnoses:  Dehiscence of operative wound  Discharge Diagnoses:  Principal Problem:   Dehiscence of operative wound Active Problems:   Wound dehiscence, surgical   Past Medical History  Diagnosis Date  . Secondary hyperparathyroidism, renal ([place])     s/p  total parathyroidectomy  2014  . Hypertension   . History of acute pulmonary edema     2003  . Left patella fracture   . Wears glasses   . Complication of anesthesia ~ 2011    "they gave me a medicine that swolled me and mouth burning up" (08/23/2012)  . Seasonal allergies   . Chronic bronchitis ([place])   . ESRD (end stage renal disease) on dialysis [facility]) Nephrologist-- dr deterding    ESRD due to HTN-; "M/W/F; [place]" (11/28/2015    Surgeries: Procedure(s): IRRIGATION AND DEBRIDEMENT LEFT KNEE WOUND  APPLICATION OF WOUND VAC on 11/28/2015   Consultants (if any): Treatment Team:  Roney Jaffe, MD  Discharged Condition: Improved  Hospital Course: Victoria [name] is an 60 y.o. female who was admitted 11/28/2015 with a diagnosis of Dehiscence of operative wound and went to the operating room on 11/28/2015 and underwent the above named procedures.    She was given perioperative antibiotics:  Anti-infectives    Start     Dose/Rate Route Frequency Ordered Stop   12/01/15 1200  vancomycin (VANCOCIN) IVPB 1000 mg/200 mL premix     1,000 mg 200 mL/hr over 60 Minutes Intravenous Every M-W-F (Hemodialysis) 11/29/15 1056     12/01/15 0814  vancomycin (VANCOCIN) 1 GM/200ML IVPB    Comments:  Cherylann Banas   : cabinet override      12/01/15 0814 12/01/15 1000   11/29/15 1553  vancomycin (VANCOCIN) 1 GM/200ML IVPB    Comments:  [place], Zenaida   : cabinet override      11/29/15 1553 11/29/15 1556   11/29/15 1100  vancomycin (VANCOCIN) IVPB 1000 mg/200 mL premix      1,000 mg 200 mL/hr over 60 Minutes Intravenous  Once 11/29/15 1055 11/29/15 1656   11/28/15 1445  vancomycin (VANCOCIN) 1,500 mg in sodium chloride 0.9 % 500 mL IVPB     1,500 mg 250 mL/hr over 120 Minutes Intravenous  Once 11/28/15 1431 11/28/15 1729   11/28/15 1430  piperacillin-tazobactam (ZOSYN) IVPB 2.25 g     2.25 g 100 mL/hr over 30 Minutes Intravenous 3 times per day 11/28/15 1429     11/28/15 0630  ceFAZolin (ANCEF) IVPB 2 g/50 mL premix     2 g 100 mL/hr over 30 Minutes Intravenous On call to O.R. 11/28/15 [id number] 11/28/15 0726    .  She was given sequential compression devices, early ambulation, and ASA for DVT prophylaxis. On POD#3 the wound VAC was taken down. The incision looked improved. Prevena was applied. Intraop cultures were (-).  She benefited maximally from the hospital stay and there were no complications.    Recent vital signs:  Filed Vitals:   12/01/15 1100 12/01/15 1108  BP: 110/60 119/65  Pulse: 80 86  Temp:  98.3 F (36.8 C)  Resp: 18 18    Recent laboratory studies:  Lab Results  Component Value Date   HGB 10.9* 12/01/2015   HGB 10.7* 11/30/2015   HGB 10.9* 11/29/2015   Lab Results  Component Value Date   WBC 10.1 12/01/2015   PLT  205 12/01/2015   Lab Results  Component Value Date   INR 1.07 11/28/2015   Lab Results  Component Value Date   NA 137 12/01/2015   K 4.4 12/01/2015   CL 100* 12/01/2015   CO2 26 12/01/2015   BUN 39* 12/01/2015   CREATININE 11.49* 12/01/2015   GLUCOSE 99 12/01/2015    Discharge Medications:     Medication List    STOP taking these medications        clindamycin 300 MG capsule  Commonly known as:  CLEOCIN     predniSONE 10 MG (21) Tbpk tablet  Commonly known as:  STERAPRED UNI-PAK 21 TAB      TAKE these medications        aspirin 81 MG tablet  Take 1 tablet (81 mg total) by mouth 2 (two) times daily after a meal. Reported on 10/02/2015     calcium acetate 667 MG capsule  Commonly known as:   PHOSLO  Take 667 mg by mouth 3 (three) times daily with meals.     doxycycline 100 MG capsule  Commonly known as:  VIBRAMYCIN  Take 100 mg by mouth 2 (two) times daily. 14 day course started 11/14/15     lactobacillus acidophilus & bulgar chewable tablet  Chew 1 tablet by mouth 3 (three) times daily with meals.     omeprazole 20 MG capsule  Commonly known as:  PRILOSEC  Take 1 capsule (20 mg total) by mouth daily.     ondansetron 4 MG tablet  Commonly known as:  ZOFRAN  Take 1 tablet (4 mg total) by mouth every 8 (eight) hours as needed for nausea or vomiting.     oxyCODONE 5 MG immediate release tablet  Commonly known as:  ROXICODONE  Take 1-2 tablets (5-10 mg total) by mouth every 3 (three) hours as needed for severe pain.     senna 8.6 MG Tabs tablet  Commonly known as:  SENOKOT  Take 2 tablets (17.2 mg total) by mouth at bedtime.        Diagnostic Studies: Ct Soft Tissue Neck Wo Contrast  11/16/2015  CLINICAL DATA:  Throat pain, enlarged uvula. EXAM: CT NECK WITHOUT CONTRAST TECHNIQUE: Multidetector CT imaging of the neck was performed following the standard protocol without intravenous contrast. COMPARISON:  None. FINDINGS: The inferior aspect of the soft palate, including the uvula, appears mildly bulbous, measuring 11 x 11 mm cross-section. See image 36 series 3. Correlate clinically for soft palate inflammation. Low-attenuation central area could indicate abscess formation. No other pharyngeal or laryngeal abnormalities. Unremarkable salivary glands. No pathologic adenopathy within limits for assessment on noncontrast exam. Atherosclerotic calcification at the bifurcations. Previous thyroid surgery clips. No lung apex lesion. Cervical spondylosis. Negative intracranial compartment. No sinus or mastoid fluid. Unremarkable orbits. IMPRESSION: Soft palate and uvular enlargement of uncertain significance. Recommend correlation with physical exam. Soft palate inflammation related to  pharyngitis, or even phlegmon or early abscess formation, not excluded. No tonsillar, parapharyngeal, or retropharyngeal masses or fluid collections of significance. Electronically Signed   By: Staci Righter M.D.   On: 11/16/2015 16:02    Disposition: 01-Home or Self Care      Discharge Instructions    Call MD / Call 911    Complete by:  As directed   If you experience chest pain or shortness of breath, CALL 911 and be transported to the hospital emergency room.  If you develope a fever above 101 F, pus (white drainage) or increased drainage or  redness at the wound, or calf pain, call your surgeon's office.     Constipation Prevention    Complete by:  As directed   Drink plenty of fluids.  Prune juice may be helpful.  You may use a stool softener, such as Colace (over the counter) 100 mg twice a day.  Use MiraLax (over the counter) for constipation as needed.     Diet - low sodium heart healthy    Complete by:  As directed      Discharge instructions    Complete by:  As directed   Do not remove wound VAC to left knee. Keep it clean and dry. Knee immobilizer at all times. Do not bend your knee.     Driving restrictions    Complete by:  As directed   No driving for 6 weeks     Increase activity slowly as tolerated    Complete by:  As directed      Lifting restrictions    Complete by:  As directed   No lifting for 6 weeks           Follow-up Information    Follow up with Darlean Warmoth, Horald Pollen, MD. Schedule an appointment as soon as possible for a visit in 1 week.   Specialty:  Orthopedic Surgery   Why:  For wound re-check   Contact information:   [address] [place] [address] [phone]        Signed: Elie Goody 12/01/2015, 1:30 PM

## 2015-12-01 NOTE — Progress Notes (Signed)
Physical Therapy Treatment Patient Details Name: Victoria [name] MRN: [id number] DOB: 07-03-1956 Today's Date: 12/01/2015    History of Present Illness 60 y.o. female s/p IRRIGATION AND DEBRIDEMENT LEFT KNEE WOUND with application of wound vac. Hx of ESRD on hemodialysis .    PT Comments    Patient is making good progress with PT.  From a mobility standpoint anticipate patient will be ready for DC home when medically ready.     Follow Up Recommendations  Outpatient PT     Equipment Recommendations  None recommended by PT    Recommendations for Other Services       Precautions / Restrictions Precautions Precautions: Knee Precaution Comments: DO NOT FLEX LEFT KNEE Required Braces or Orthoses: Knee Immobilizer - Left Knee Immobilizer - Left: On at all times Restrictions Weight Bearing Restrictions: Yes LLE Weight Bearing: Weight bearing as tolerated Other Position/Activity Restrictions: DO NOT FLEX LEFT KNEE    Mobility  Bed Mobility Overal bed mobility: Modified Independent             General bed mobility comments: extra time  Transfers Overall transfer level: Needs assistance Equipment used: Rolling walker (2 wheeled) Transfers: Sit to/from Stand Sit to Stand: Supervision         General transfer comment: cues for hand placement and supervision for safety  Ambulation/Gait Ambulation/Gait assistance: Supervision Ambulation Distance (Feet): 150 Feet Assistive device: Rolling walker (2 wheeled) Gait Pattern/deviations: Step-through pattern;Decreased stride length;Decreased stance time - left;Trunk flexed Gait velocity: decreased   General Gait Details: cues for posture and increased bilat step lenght; pt with improved ability for step through pattern   Stairs            Wheelchair Mobility    Modified Rankin (Stroke Patients Only)       Balance                                    Cognition Arousal/Alertness:  Awake/alert Behavior During Therapy: WFL for tasks assessed/performed Overall Cognitive Status: Within Functional Limits for tasks assessed                      Exercises General Exercises - Lower Extremity Ankle Circles/Pumps: AROM;Both;20 reps;Seated Straight Leg Raises: AROM;Left;5 reps;Seated    General Comments General comments (skin integrity, edema, etc.): reviewed donning of KI, precuations, and HEP      Pertinent Vitals/Pain Pain Assessment: No/denies pain    Home Living                      Prior Function            PT Goals (current goals can now be found in the care plan section) Acute Rehab PT Goals Patient Stated Goal: go home PT Goal Formulation: With patient Time For Goal Achievement: 12/12/15 Potential to Achieve Goals: Good Progress towards PT goals: Progressing toward goals    Frequency  Min 3X/week    PT Plan Current plan remains appropriate    Co-evaluation             End of Session Equipment Utilized During Treatment: Gait belt;Left knee immobilizer Activity Tolerance: Patient tolerated treatment well Patient left: with call bell/phone within reach;in chair     Time: 1220-1247 PT Time Calculation (min) (ACUTE ONLY): 27 min  Charges:  $Gait Training: 8-22 mins $Therapeutic Exercise: 8-22 mins  G Codes:      [place] April, PTA Pager: [phone]   12/01/2015, 1:27 PM

## 2015-12-01 NOTE — Progress Notes (Signed)
Pharmacy Antibiotic Note  Victoria [name] is a 60 y.o. female admitted on 11/28/2015 with L knee wound dehiscence s/p revision on 3/10.  Pharmacy has been consulted for Zosyn and vancomycin dosing.  Vanc LD 1.5 given 3/10. Vanc 1g MD's given w/in 1 hr of completing HD. Tolerated 3.5 hrs HD 3/13 and 3/11 at 350-400 mL/min. WBC 13.5 > 11.6 > 10.1, afebrile Cx's: NGTD  Plan: 1. Continue Zosyn 2.25g IV q8h  2. Continue vancomycin 1g IV qHD MWF 3. Anticipate discharge soon  Height: 5' 3.5" (161.3 cm) Weight: 202 lb 9.6 oz (91.9 kg) IBW/kg (Calculated) : 53.55  Temp (24hrs), Avg:98.3 F (36.8 C), Min:97.7 F (36.5 C), Max:98.8 F (37.1 C)   Recent Labs Lab 11/28/15 0629 11/29/15 0541 11/29/15 1401 11/29/15 1406 11/30/15 0535 12/01/15 0530  WBC 10.2 11.6*  --  13.5* 11.6* 10.1  CREATININE 11.82* 11.56* 13.04*  --  8.78* 11.49*    Estimated Creatinine Clearance: 5.7 mL/min (by C-G formula based on Cr of 11.49).    Allergies  Allergen Reactions  . Lisinopril Swelling and Other (See Comments)    "made my chest hurt"  . Norvasc [Amlodipine Besylate] Swelling   Antimicrobials this admission: Vanc 3/10 >>  Zosyn 3/10 >>  Dose adjustments this admission: Vanc LD 1.5g > 1g MD  Microbiology results: 3/10 Wound Cx's: NGTD  Thank you for allowing pharmacy to be a part of this patient's care.  Jonna Munro, PharmD Candidate 12/01/2015 11:14 AM

## 2015-12-01 NOTE — Progress Notes (Signed)
   Subjective:  Patient reports pain as mild.  No c/o. Feels fine, wants to go home.  Objective:   VITALS:   Filed Vitals:   12/01/15 1000 12/01/15 1030 12/01/15 1100 12/01/15 1108  BP: 110/74 111/67 110/60 119/65  Pulse: 82 82 80 86  Temp:    98.3 F (36.8 C)  TempSrc:    Oral  Resp: 18 18 18 18   Height:      Weight:    89.5 kg (197 lb 5 oz)  SpO2:    98%    ABD soft Sensation intact distally Intact pulses distally Dorsiflexion/Plantar flexion intact Compartment soft Wound VAC intact - I took her her VAC. incis c/d/i. Small wound over center of incision. Looks great.  Lab Results  Component Value Date   WBC 10.1 12/01/2015   HGB 10.9* 12/01/2015   HCT 34.1* 12/01/2015   MCV 83.8 12/01/2015   PLT 205 12/01/2015   BMET    Component Value Date/Time   NA 137 12/01/2015 0530   K 4.4 12/01/2015 0530   CL 100* 12/01/2015 0530   CO2 26 12/01/2015 0530   GLUCOSE 99 12/01/2015 0530   BUN 39* 12/01/2015 0530   CREATININE 11.49* 12/01/2015 0530   CALCIUM 7.7* 12/01/2015 0530   GFRNONAA 3* 12/01/2015 0530   GFRAA 4* 12/01/2015 0530     Assessment/Plan: 3 Days Post-Op   Principal Problem:   Dehiscence of operative wound Active Problems:   Wound dehiscence, surgical   Knee immobilizer at all times Prevena applied to L knee Intracop cultures (-) D/C home   Semiyah Newgent, Horald Pollen 12/01/2015, 1:13 PM   Rod Can, MD Cell [phone]

## 2015-12-01 NOTE — Discharge Instructions (Signed)
Knee immobilizer at all times Do NOT bend your left knee Weight bearing as tolerated in the knee immobilizer.  Keep wound VAC clean and dry. Do NOT remove. Make appoint to see Dr. Lyla Glassing on 3/20

## 2015-12-01 NOTE — Procedures (Signed)
Tolerating HD. Alert and awake. No hemodynamic instability. K 4.4, Hgb 10.9gm.  Victoria [name] C

## 2015-12-03 LAB — ANAEROBIC CULTURE

## 2015-12-15 ENCOUNTER — Encounter: Payer: Self-pay | Admitting: Gastroenterology

## 2016-01-05 ENCOUNTER — Encounter (HOSPITAL_COMMUNITY): Payer: Self-pay | Admitting: Adult Health

## 2016-01-05 DIAGNOSIS — Z992 Dependence on renal dialysis: Secondary | ICD-10-CM | POA: Diagnosis not present

## 2016-01-05 DIAGNOSIS — E869 Volume depletion, unspecified: Secondary | ICD-10-CM | POA: Diagnosis not present

## 2016-01-05 DIAGNOSIS — R1013 Epigastric pain: Secondary | ICD-10-CM | POA: Insufficient documentation

## 2016-01-05 DIAGNOSIS — Z792 Long term (current) use of antibiotics: Secondary | ICD-10-CM | POA: Diagnosis not present

## 2016-01-05 DIAGNOSIS — N186 End stage renal disease: Secondary | ICD-10-CM | POA: Diagnosis not present

## 2016-01-05 DIAGNOSIS — Z79899 Other long term (current) drug therapy: Secondary | ICD-10-CM | POA: Insufficient documentation

## 2016-01-05 DIAGNOSIS — Z8742 Personal history of other diseases of the female genital tract: Secondary | ICD-10-CM | POA: Diagnosis not present

## 2016-01-05 DIAGNOSIS — R51 Headache: Secondary | ICD-10-CM | POA: Diagnosis not present

## 2016-01-05 DIAGNOSIS — R112 Nausea with vomiting, unspecified: Secondary | ICD-10-CM | POA: Diagnosis not present

## 2016-01-05 DIAGNOSIS — Z7982 Long term (current) use of aspirin: Secondary | ICD-10-CM | POA: Diagnosis not present

## 2016-01-05 DIAGNOSIS — Z8781 Personal history of (healed) traumatic fracture: Secondary | ICD-10-CM | POA: Diagnosis not present

## 2016-01-05 DIAGNOSIS — I120 Hypertensive chronic kidney disease with stage 5 chronic kidney disease or end stage renal disease: Secondary | ICD-10-CM | POA: Diagnosis not present

## 2016-01-05 DIAGNOSIS — R1010 Upper abdominal pain, unspecified: Secondary | ICD-10-CM | POA: Diagnosis present

## 2016-01-05 DIAGNOSIS — Z8709 Personal history of other diseases of the respiratory system: Secondary | ICD-10-CM | POA: Diagnosis not present

## 2016-01-05 DIAGNOSIS — R1012 Left upper quadrant pain: Secondary | ICD-10-CM | POA: Insufficient documentation

## 2016-01-05 LAB — I-STAT TROPONIN, ED: Troponin i, poc: 0.03 ng/mL (ref 0.00–0.08)

## 2016-01-05 LAB — CBC
HEMATOCRIT: 42.7 % (ref 36.0–46.0)
HEMOGLOBIN: 13.7 g/dL (ref 12.0–15.0)
MCH: 26.3 pg (ref 26.0–34.0)
MCHC: 32.1 g/dL (ref 30.0–36.0)
MCV: 82 fL (ref 78.0–100.0)
Platelets: 241 10*3/uL (ref 150–400)
RBC: 5.21 MIL/uL — ABNORMAL HIGH (ref 3.87–5.11)
RDW: 16.1 % — ABNORMAL HIGH (ref 11.5–15.5)
WBC: 14.8 10*3/uL — AB (ref 4.0–10.5)

## 2016-01-05 LAB — COMPREHENSIVE METABOLIC PANEL
ALT: 17 U/L (ref 14–54)
ANION GAP: 12 (ref 5–15)
AST: 24 U/L (ref 15–41)
Albumin: 3.6 g/dL (ref 3.5–5.0)
Alkaline Phosphatase: 62 U/L (ref 38–126)
BUN: 20 mg/dL (ref 6–20)
CHLORIDE: 99 mmol/L — AB (ref 101–111)
CO2: 28 mmol/L (ref 22–32)
Calcium: 10 mg/dL (ref 8.9–10.3)
Creatinine, Ser: 6.26 mg/dL — ABNORMAL HIGH (ref 0.44–1.00)
GFR calc Af Amer: 8 mL/min — ABNORMAL LOW (ref >60)
GFR calc non Af Amer: 7 mL/min — ABNORMAL LOW (ref >60)
GLUCOSE: 117 mg/dL — AB (ref 65–99)
POTASSIUM: 3.9 mmol/L (ref 3.5–5.1)
Sodium: 139 mmol/L (ref 135–145)
Total Bilirubin: 0.7 mg/dL (ref 0.3–1.2)
Total Protein: 7.8 g/dL (ref 6.5–8.1)

## 2016-01-05 LAB — LIPASE, BLOOD: Lipase: 45 U/L (ref 11–51)

## 2016-01-05 MED ORDER — ONDANSETRON 4 MG PO TBDP
ORAL_TABLET | ORAL | Status: AC
  Filled 2016-01-05: qty 1

## 2016-01-05 MED ORDER — ONDANSETRON 4 MG PO TBDP
4.0000 mg | ORAL_TABLET | Freq: Once | ORAL | Status: AC | PRN
  Administered 2016-01-05: 4 mg via ORAL

## 2016-01-05 NOTE — ED Notes (Addendum)
Presents with abdominal pain described as cramping associated with nausea began after eating a ham biscuit today. Pain is getting worse. Begins in upper abdomen and goes into umbilicus.  Denies diarrhea. Abdomen is soft. Pt is dialysis pt, days MWF. Up to date on dilalysis. vomittng at triage

## 2016-01-06 ENCOUNTER — Emergency Department (HOSPITAL_COMMUNITY)
Admission: EM | Admit: 2016-01-06 | Discharge: 2016-01-06 | Disposition: A | Discharge status: Home / Self Care | Payer: Medicare Other | Attending: Emergency Medicine | Admitting: Emergency Medicine

## 2016-01-06 ENCOUNTER — Emergency Department (HOSPITAL_COMMUNITY): Payer: Medicare Other

## 2016-01-06 DIAGNOSIS — E869 Volume depletion, unspecified: Secondary | ICD-10-CM

## 2016-01-06 DIAGNOSIS — R109 Unspecified abdominal pain: Secondary | ICD-10-CM

## 2016-01-06 DIAGNOSIS — R1012 Left upper quadrant pain: Secondary | ICD-10-CM | POA: Diagnosis not present

## 2016-01-06 DIAGNOSIS — R112 Nausea with vomiting, unspecified: Secondary | ICD-10-CM

## 2016-01-06 MED ORDER — ONDANSETRON HCL 4 MG/2ML IJ SOLN
4.0000 mg | Freq: Once | INTRAMUSCULAR | Status: AC
  Administered 2016-01-06: 4 mg via INTRAVENOUS
  Filled 2016-01-06: qty 2

## 2016-01-06 MED ORDER — SODIUM CHLORIDE 0.9 % IV BOLUS (SEPSIS)
500.0000 mL | Freq: Once | INTRAVENOUS | Status: AC
  Administered 2016-01-06: 500 mL via INTRAVENOUS

## 2016-01-06 MED ORDER — MORPHINE SULFATE (PF) 4 MG/ML IV SOLN
6.0000 mg | Freq: Once | INTRAVENOUS | Status: AC
  Administered 2016-01-06: 6 mg via INTRAVENOUS
  Filled 2016-01-06: qty 2

## 2016-01-06 MED ORDER — ONDANSETRON 8 MG PO TBDP: 8.0000 mg | ORAL_TABLET | Freq: Three times a day (TID) | ORAL | Status: DC | PRN

## 2016-01-06 NOTE — ED Provider Notes (Signed)
CSN: [id number]     Arrival date & time 01/05/16  2102 History  By signing my name below, I, Helane Gunther, attest that this documentation has been prepared under the direction and in the presence of Jola Schmidt, MD. Electronically Signed: Helane Gunther, ED Scribe. 01/06/2016. 3:30 AM.    Chief Complaint  Patient presents with  . Abdominal Pain   The history is provided by the patient. No language interpreter was used.   HPI Comments: Victoria [name] is a 60 y.o. female with a PMHx of HTN and ESRD and a PSHx of total abdominal hysterectomy who presents to the Emergency Department complaining of constant, worsening, cramping upper abdominal pain onset 15 hours ago. Pt states she ate a ham sandwich just before the pain began. She reports associated nausea, vomiting, and headache. She states she has not been able to keep anything down, including liquids. She denies any previous occurences of similar pain. She states she went to her regular dialysis appointment yesterday morning (Monday). Pt denies diarrhea.   Past Medical History  Diagnosis Date  . Secondary hyperparathyroidism, renal ([place])     s/p  total parathyroidectomy  2014  . Hypertension   . History of acute pulmonary edema     2003  . Left patella fracture   . Wears glasses   . Complication of anesthesia ~ 2011    "they gave me a medicine that swolled me and mouth burning up" (08/23/2012)  . Seasonal allergies   . Chronic bronchitis ([place])   . ESRD (end stage renal disease) on dialysis [facility]) Nephrologist-- dr deterding    ESRD due to HTN-; "M/W/F; [place] (11/28/2015   Past Surgical History  Procedure Laterality Date  . Dialysis fistula creation  1992    left upper arm ---  w/  Multiple Revision's until 2006  . Revision of arteriovenous goretex graft  09/27/2012    Procedure: REVISION OF ARTERIOVENOUS GORETEX GRAFT;  Surgeon: Mal Misty, MD;  Location: [facility] OR;  Service: Vascular;  Laterality: Right;  1) Replacement of  venous half of loop with 14mm Gortex graft  2) Excision of erroded pseudoaneurysm of graft with primary closure.  . Patellectomy  10/03/2012    Procedure: PATELLECTOMY;  Surgeon: Marin Shutter, MD;  Location: [place];  Service: Orthopedics;  Laterality: Right;  RIGHT PARTIAL PATELLECTOMY AND PATELLA TENDON REPAIR  . Patellar tendon repair  10/03/2012    Procedure: PATELLA TENDON REPAIR;  Surgeon: Marin Shutter, MD;  Location: [place];  Service: Orthopedics;  Laterality: Right;  . Revision of arteriovenous goretex graft Right 01/23/2013    Procedure: REVISION OF ARTERIOVENOUS GORETEX GRAFT;  Surgeon: Conrad Frederick, MD;  Location: [place];  Service: Vascular;  Laterality: Right;  Using piece of 54mm x 20cm Gortex graft.   . Revision of arteriovenous goretex graft Right 10/07/2015    Procedure: REVISION OF Right arm ARTERIOVENOUS GORETEX GRAFT;  Surgeon: Elam Dutch, MD;  Location: [place];  Service: Vascular;  Laterality: Right;  . Total parathyroidectomy/  thyroid isthmusectomy/  autotransplantation parathyroid tissue to left brachioriadialis muscle  02-18-2011  . Ankle fracture surgery Right ~ 2005    "has pins in it"  . Ectopic pregnancy surgery  1970's  . Arteriovenous graft placement  03-25-2005    Right forearm  w/  multiple Revision's   . Transthoracic echocardiogram  10-31-2012    mild LVH,  grade 1 diastolic dysfunction,  ef 55-65%/  mild MR/  trivial TR  .  Cardiovascular stress test  08-24-2012    abnormal nuclear study/  inferolateral and anteroseptal areas of scar,  no ischemia/  normal LV function and wall motion, ef 77%  . Orif patella Left 10/31/2015    Procedure: OPEN REDUCTION INTERNAL (ORIF) FIXATION LEFT PATELLA;  Surgeon: Rod Can, MD;  Location: [place];  Service: Orthopedics;  Laterality: Left;  . Incision and drainage of wound Left 11/28/2015    knee  . Application of wound vac Left 11/28/2015    knee  . Total abdominal hysterectomy  03-05-2005    w/  Right  Ovarian Cystectomy  . Fracture surgery    . I&d extremity Left 11/28/2015    Procedure: IRRIGATION AND DEBRIDEMENT LEFT KNEE WOUND ;  Surgeon: Rod Can, MD;  Location: [place];  Service: Orthopedics;  Laterality: Left;  . Application of wound vac Left 11/28/2015    Procedure: APPLICATION OF WOUND VAC;  Surgeon: Rod Can, MD;  Location: [place];  Service: Orthopedics;  Laterality: Left;   Family History  Problem Relation Age of Onset  . Hypertension Father   . Thyroid disease Father   . Hypertension Sister   . Thyroid disease Sister    Social History  Substance Use Topics  . Smoking status: Never Smoker   . Smokeless tobacco: Never Used  . Alcohol Use: 0.0 oz/week    0 Standard drinks or equivalent per week     Comment: quit 2007   OB History    No data available     Review of Systems A complete 10 system review of systems was obtained and all systems are negative except as noted in the HPI and PMH.   Allergies  Lisinopril and Norvasc  Home Medications   Prior to Admission medications   Medication Sig Start Date End Date Taking? Authorizing Provider  aspirin 81 MG tablet Take 1 tablet (81 mg total) by mouth 2 (two) times daily after a meal. Reported on 10/02/2015 10/31/15  Yes Rod Can, MD  calcium acetate (PHOSLO) 667 MG capsule Take 667 mg by mouth 3 (three) times daily with meals.   Yes Historical Provider, MD  doxycycline (VIBRAMYCIN) 100 MG capsule Take 100 mg by mouth 2 (two) times daily.    Yes Historical Provider, MD   BP 151/104 mmHg  Pulse 95  Temp(Src) 98.5 F (36.9 C) (Oral)  Resp 17  Wt 190 lb 6 oz (86.354 kg)  SpO2 99% Physical Exam  Constitutional: She is oriented to person, place, and time. She appears well-developed and well-nourished. No distress.  Uncomfortable appearing  HENT:  Head: Normocephalic and atraumatic.  Eyes: EOM are normal.  Neck: Normal range of motion.  Cardiovascular: Normal rate, regular rhythm and normal heart sounds.    Pulmonary/Chest: Effort normal and breath sounds normal.  Abdominal: Soft. She exhibits no distension. There is tenderness (epigastric and LUQ).  No RUQ TTP  Musculoskeletal: Normal range of motion.  Neurological: She is alert and oriented to person, place, and time.  Skin: Skin is warm and dry.  Psychiatric: She has a normal mood and affect. Judgment normal.  Nursing note and vitals reviewed.   ED Course  Procedures  DIAGNOSTIC STUDIES: Oxygen Saturation is 96% on RA, adequate by my interpretation.    COORDINATION OF CARE: 3:29 AM - Discussed plans to order IV fluids, pain medication, and anti-nausea medication. Will order a CT scan. Pt advised of plan for treatment and pt agrees.  Labs Review Labs Reviewed  COMPREHENSIVE METABOLIC  PANEL - Abnormal; Notable for the following:    Chloride 99 (*)    Glucose, Bld 117 (*)    Creatinine, Ser 6.26 (*)    GFR calc non Af Amer 7 (*)    GFR calc Af Amer 8 (*)    All other components within normal limits  CBC - Abnormal; Notable for the following:    WBC 14.8 (*)    RBC 5.21 (*)    RDW 16.1 (*)    All other components within normal limits  LIPASE, BLOOD  URINALYSIS, ROUTINE W REFLEX MICROSCOPIC (NOT AT [facility])  I-STAT TROPOININ, ED    Imaging Review Ct Abdomen Pelvis Wo Contrast  01/06/2016  ADDENDUM REPORT: 01/06/2016 06:57 ADDENDUM: Diffusely increased density in the liver suggesting iron overload. Electronically Signed   By: Lucienne Capers M.D.   On: 01/06/2016 06:57  01/06/2016  CLINICAL DATA:  Upper abdominal pain with nausea and vomiting after eating ham biscuit today. End-stage renal disease. EXAM: CT ABDOMEN AND PELVIS WITHOUT CONTRAST TECHNIQUE: Multidetector CT imaging of the abdomen and pelvis was performed following the standard protocol without IV contrast. COMPARISON:  12/31/2014 FINDINGS: Atelectasis in the lung bases. Evaluation of solid organs and vascular structures is limited without IV contrast material.  Evaluation of bowel structures is limited without oral contrast material. Multiple circumscribed low-attenuation lesions are present in the liver, largest measuring about 11 mm maximal diameter. These lesions were present previously on contrast-enhanced imaging and probably represent cysts. The unenhanced appearance of the gallbladder, spleen, pancreas, adrenal glands, abdominal aorta, inferior vena cava, and retroperitoneal lymph nodes is unremarkable. Bilateral renal parenchymal atrophy with multiple renal cysts. No hydronephrosis. Solid exophytic lesion arising from the lower pole right kidney and measuring about 2.1 cm diameter. This was present previously without significant change. Previous MRI of this lesion from 06/28/2014 showed it to be indeterminate. This could represent hemorrhagic cyst or solid lesion. Size is remaining stable. Six-month follow-up suggested. There is a circumscribed mass anterior to the lower vena cava, measuring about 4.4 cm diameter. This lesion was also present on the previous MRI examination without significant change in size. 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 Increased CT density of this lesion may indicate a proteinaceous or hemorrhagic lesion. The stomach, small bowel, and colon are not abnormally distended. No free air or free fluid in the abdomen. Pelvis: Diverticula in the sigmoid colon without evidence of diverticulitis. Surgical absence of the uterus. No pelvic mass or lymphadenopathy. The appendix is normal. No free or loculated pelvic fluid collections. No destructive bone lesions. IMPRESSION: Bilateral renal atrophy and multiple renal cysts consistent with chronic renal failure. Indeterminate lesion in the lower pole of the right kidney may represent a solid lesion. Size is relatively stable since previous studies. Six-month follow-up recommended. Circumscribed mesenteric lesion is unchanged in size since previous studies. 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 Multiple hepatic cysts. No evidence of bowel obstruction. Electronically Signed: By: Lucienne Capers M.D. On: 01/06/2016 04:37   I have personally reviewed and evaluated these images and lab results as part of my medical decision-making.   EKG Interpretation   Date/Time:  Monday January 05 2016 21:39:00 EDT Ventricular Rate:  95 PR Interval:  194 QRS Duration: 96 QT Interval:  392 QTC Calculation: 492 R Axis:   -56 Text Interpretation:  Normal sinus rhythm Left anterior fascicular block  Possible Lateral infarct , age undetermined Abnormal ECG No significant  change was found Confirmed by Justyce Yeater  MD, Lennette Bihari ([id number]) on 01/06/2016  3:27:23 AM      MDM   Final diagnoses:  Abdominal pain, unspecified abdominal location  Nausea and vomiting, vomiting of unspecified type  Volume depletion    Patient feels much better this time.  Workup in emergency department is without significant abnormality.  CT scan of the abdomen and pelvis demonstrates no acute pathology.  Discharge home with primary care follow-up.  She understands return to the ER for new or worsening symptoms.  I personally performed the services described in this documentation, which was scribed in my presence. The recorded information has been reviewed and is accurate.       Jola Schmidt, MD 01/07/16 [phone]

## 2016-01-06 NOTE — ED Notes (Signed)
Pt is a dialysis pt, does not produce urine.

## 2016-01-06 NOTE — ED Notes (Signed)
Patient transported to CT 

## 2016-01-23 ENCOUNTER — Encounter: Payer: Self-pay | Admitting: Vascular Surgery

## 2016-01-28 ENCOUNTER — Other Ambulatory Visit: Payer: Self-pay

## 2016-01-28 ENCOUNTER — Encounter: Payer: Self-pay | Admitting: Vascular Surgery

## 2016-01-28 ENCOUNTER — Ambulatory Visit (INDEPENDENT_AMBULATORY_CARE_PROVIDER_SITE_OTHER): Payer: Medicare Other | Admitting: Vascular Surgery

## 2016-01-28 VITALS — BP 141/82 | HR 88 | Temp 97.6°F | Resp 14 | Ht 63.5 in | Wt 189.0 lb

## 2016-01-28 DIAGNOSIS — N186 End stage renal disease: Secondary | ICD-10-CM | POA: Diagnosis not present

## 2016-01-28 DIAGNOSIS — Z992 Dependence on renal dialysis: Secondary | ICD-10-CM

## 2016-01-28 NOTE — Progress Notes (Signed)
Patient is a 60 year old female referred by Dr. 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 She was seen for similar problem October 2016 in January 2017. At that time the skin was intact and not compromised. The patient has had multiple revisions of her AV graft in 2011, 2012 and replacement of the arterial and venous limbs in 2014. She has had no bleeding episodes but the aneurysm is enlarging fairly rapidly. .  There is no eschar over the aneurysm. She states the skin over one of the aneurysms has become more thinned. She states they have been present for quite some time. Her dialysis today is Monday Wednesday Friday. Chronic medical problems include hypertension and bronchitis both of which are currently stable.  Review of systems: She denies shortness of breath. She denies chest pain.      Past Medical History    Diagnosis   Date    .   Seasonal allergies       .   H/O cardiovascular stress test             Myoview 08/24/12: Inferolateral and anteroseptal areas of scar, no findings of ischemia, EF 77%.    .   Dialysis patient ([place])       .   Complication of anesthesia   ~ 2011          "they gave me a medicine that swolled me and mouth burning up" (08/23/2012)    .   Hypertension             Now controlled by HD    .   History of bronchitis             questions if she has it now bc throat is scratchy and coughing    .   ESRD on dialysis [facility])             M/W/F on [employer]        Past Surgical History    Procedure   Laterality   Date    .   Parathyroidectomy      2012          With implant to left forearm    .   Thyroidectomy, partial      2012          Isthmusectomy     .   Total abdominal hysterectomy      2007    .   Dilation and curettage of uterus      1970's          "when I was pregnant in my tubes" (08/23/2012)    .   Thrombectomy and revision of arterioventous (av) goretex  graft      2012, 2013    .   Dialysis fistula creation      1992          "left forearm"  (08/23/2012)    .   Arteriovenous graft placement      1990's; ~ 2011          "left upper arm; right forearm" (08/23/2012)    .   Ankle fracture surgery      ~ 2009          "right" (08/23/2012)    .   Revision of arteriovenous goretex graft      09/27/2012          Procedure: REVISION OF ARTERIOVENOUS GORETEX GRAFT;  Surgeon: Mal Misty, MD;  Location: [place];  Service: Vascular;  Laterality: Right;  1) Replacement of venous half of loop with 71mm Gortex graft  2) Excision of erroded pseudoaneurysm of graft with primary closure.    .   Patellectomy      10/03/2012          Procedure: PATELLECTOMY;  Surgeon: Marin Shutter, MD;  Location: [place];  Service: Orthopedics;  Laterality: Right;  RIGHT PARTIAL PATELLECTOMY AND PATELLA TENDON REPAIR    .   Patellar tendon repair      10/03/2012          Procedure: PATELLA TENDON REPAIR;  Surgeon: Marin Shutter, MD;  Location: [place];  Service: Orthopedics;  Laterality: Right;    .   Revision of arteriovenous goretex graft   Right   01/23/2013          Procedure: REVISION OF ARTERIOVENOUS GORETEX GRAFT;  Surgeon: Conrad Edgeley, MD;  Location: [place];  Service: Vascular;  Laterality: Right;  Using piece of 2mm x 20cm Gortex graft.      Current Outpatient Prescriptions on File Prior to Visit    Medication   Sig   Dispense   Refill    .   pantoprazole (PROTONIX) 40 MG tablet   Take 1 tablet (40 mg total) by mouth daily.   30 tablet   1    .   predniSONE (DELTASONE) 10 MG tablet   Take 6 tablets (60 mg total) by mouth daily. Take 60 mg 1 day then 50 mg 1 day and then 40 mg 1 day then 30 mg 1 day then 20 mg 1 day then 10 mg 1 day then stop (Patient not taking: Reported on 07/03/2015)   21 tablet   0       No current facility-administered medications on file prior to visit.        Social History       Social History    .   Marital Status:   Married          Spouse Name:   N/A    .   Number of Children:   N/A    .   Years of Education:   N/A        Occupational History    .   Not on file.       Social History Main Topics    .   Smoking status:   Never Smoker     .   Smokeless tobacco:   Never Used    .   Alcohol Use:   No             Comment: quit 2007    .   Drug Use:   No    .   Sexual Activity:   Yes       Other Topics   Concern    .   Not on file       Social History Narrative        Family History    Problem   Relation   Age of Onset    .   Hypertension   Father       .   Thyroid disease   Father       .   Hypertension   Sister       .   Thyroid disease   Sister         Review of systems: She denies shortness of breath. She  denies chest pain.  Physical exam:     Filed Vitals:   01/28/16 1105  BP: 141/82  Pulse: 88  Temp: 97.6 F (36.4 C)  Resp: 14  Height: 5' 3.5" (1.613 m)  Weight: 189 lb (85.73 kg)  SpO2: 100%   Right upper extremity: Graft aneurysm at the 7:00 position right forearm ulnar aspect aneurysm is approximately 4 cm diameter audible bruit and graft   Assessment: Degenerative aneurysm right forearm AV graft; the ulnar limb of the graft has now had thinned out compromised at risk for bleeding  Plan: Revision right forearm AV graft Feb 17 2016. Risks benefits possible palpitations and procedure details were discussed with the patient today including not limited to bleeding infection graft thrombosis. She understands and agrees to proceed.   [name], MD [facility] [place] Office: [phone] Pager: [phone]

## 2016-02-13 ENCOUNTER — Encounter (HOSPITAL_COMMUNITY): Payer: Self-pay | Admitting: *Deleted

## 2016-02-13 NOTE — Progress Notes (Signed)
Pt denies any cardiac history, chest pain or sob.  Echo - 10/31/12 - in [place] - 08/24/12 - in EP[facility] EKG - 01/19/16 - in [facility]

## 2016-02-17 ENCOUNTER — Encounter (HOSPITAL_COMMUNITY): Payer: Self-pay | Admitting: *Deleted

## 2016-02-17 ENCOUNTER — Ambulatory Visit (HOSPITAL_COMMUNITY): Payer: Medicare Other | Admitting: Certified Registered Nurse Anesthetist

## 2016-02-17 ENCOUNTER — Ambulatory Visit (HOSPITAL_COMMUNITY)
Admission: RE | Admit: 2016-02-17 | Discharge: 2016-02-17 | Disposition: A | Discharge status: Home / Self Care | Payer: Medicare Other | Source: Ambulatory Visit | Attending: Vascular Surgery | Admitting: Vascular Surgery

## 2016-02-17 ENCOUNTER — Encounter (HOSPITAL_COMMUNITY)
Admission: RE | Disposition: A | Discharge status: Home / Self Care | Payer: Self-pay | Source: Ambulatory Visit | Attending: Vascular Surgery

## 2016-02-17 DIAGNOSIS — E890 Postprocedural hypothyroidism: Secondary | ICD-10-CM | POA: Insufficient documentation

## 2016-02-17 DIAGNOSIS — Z79899 Other long term (current) drug therapy: Secondary | ICD-10-CM | POA: Diagnosis not present

## 2016-02-17 DIAGNOSIS — T82898A Other specified complication of vascular prosthetic devices, implants and grafts, initial encounter: Secondary | ICD-10-CM

## 2016-02-17 DIAGNOSIS — N186 End stage renal disease: Secondary | ICD-10-CM | POA: Diagnosis not present

## 2016-02-17 DIAGNOSIS — E892 Postprocedural hypoparathyroidism: Secondary | ICD-10-CM | POA: Diagnosis not present

## 2016-02-17 DIAGNOSIS — Z8249 Family history of ischemic heart disease and other diseases of the circulatory system: Secondary | ICD-10-CM | POA: Diagnosis not present

## 2016-02-17 DIAGNOSIS — Z90710 Acquired absence of both cervix and uterus: Secondary | ICD-10-CM | POA: Insufficient documentation

## 2016-02-17 DIAGNOSIS — I728 Aneurysm of other specified arteries: Secondary | ICD-10-CM | POA: Insufficient documentation

## 2016-02-17 DIAGNOSIS — I120 Hypertensive chronic kidney disease with stage 5 chronic kidney disease or end stage renal disease: Secondary | ICD-10-CM | POA: Insufficient documentation

## 2016-02-17 DIAGNOSIS — Z8349 Family history of other endocrine, nutritional and metabolic diseases: Secondary | ICD-10-CM | POA: Diagnosis not present

## 2016-02-17 DIAGNOSIS — Z992 Dependence on renal dialysis: Secondary | ICD-10-CM | POA: Diagnosis not present

## 2016-02-17 HISTORY — DX: Gastro-esophageal reflux disease without esophagitis: K21.9

## 2016-02-17 HISTORY — DX: Pneumonia, unspecified organism: J18.9

## 2016-02-17 HISTORY — DX: Disorder of thyroid, unspecified: E07.9

## 2016-02-17 HISTORY — PX: REVISION OF ARTERIOVENOUS GORETEX GRAFT: SHX6073

## 2016-02-17 LAB — POCT I-STAT 4, (NA,K, GLUC, HGB,HCT)
Glucose, Bld: 78 mg/dL (ref 65–99)
HCT: 40 % (ref 36.0–46.0)
HEMOGLOBIN: 13.6 g/dL (ref 12.0–15.0)
POTASSIUM: 4.1 mmol/L (ref 3.5–5.1)
Sodium: 141 mmol/L (ref 135–145)

## 2016-02-17 SURGERY — REVISION OF ARTERIOVENOUS GORETEX GRAFT
Anesthesia: General | Site: Arm Lower | Laterality: Right

## 2016-02-17 MED ORDER — PHENYLEPHRINE HCL 10 MG/ML IJ SOLN
10.0000 mg | INTRAVENOUS | Status: DC | PRN
  Administered 2016-02-17: 20 ug/min via INTRAVENOUS

## 2016-02-17 MED ORDER — HEPARIN SODIUM (PORCINE) 1000 UNIT/ML IJ SOLN
INTRAMUSCULAR | Status: DC | PRN
  Administered 2016-02-17: 8000 [IU] via INTRAVENOUS

## 2016-02-17 MED ORDER — LIDOCAINE HCL (CARDIAC) 20 MG/ML IV SOLN
INTRAVENOUS | Status: DC | PRN
  Administered 2016-02-17: 70 mg via INTRAVENOUS

## 2016-02-17 MED ORDER — PROCHLORPERAZINE EDISYLATE 5 MG/ML IJ SOLN: 10.0000 mg | Freq: Four times a day (QID) | INTRAMUSCULAR | Status: DC | PRN

## 2016-02-17 MED ORDER — PROTAMINE SULFATE 10 MG/ML IV SOLN
INTRAVENOUS | Status: DC | PRN
  Administered 2016-02-17: 10 mg via INTRAVENOUS
  Administered 2016-02-17: 70 mg via INTRAVENOUS

## 2016-02-17 MED ORDER — SODIUM CHLORIDE 0.9 % IV SOLN
INTRAVENOUS | Status: DC
  Administered 2016-02-17 (×2): via INTRAVENOUS

## 2016-02-17 MED ORDER — DEXTROSE 5 % IV SOLN
1.5000 g | INTRAVENOUS | Status: AC
  Administered 2016-02-17: 1.5 g via INTRAVENOUS

## 2016-02-17 MED ORDER — FENTANYL CITRATE (PF) 250 MCG/5ML IJ SOLN
INTRAMUSCULAR | Status: AC
  Filled 2016-02-17: qty 5

## 2016-02-17 MED ORDER — CEFUROXIME SODIUM 1.5 G IJ SOLR
INTRAMUSCULAR | Status: AC
  Filled 2016-02-17: qty 1.5

## 2016-02-17 MED ORDER — OXYCODONE-ACETAMINOPHEN 5-325 MG PO TABS: 1.0000 | ORAL_TABLET | Freq: Four times a day (QID) | ORAL | Status: DC | PRN

## 2016-02-17 MED ORDER — SODIUM CHLORIDE 0.9 % IV SOLN
INTRAVENOUS | Status: DC | PRN
  Administered 2016-02-17: 12:00:00

## 2016-02-17 MED ORDER — OXYCODONE-ACETAMINOPHEN 5-325 MG PO TABS
ORAL_TABLET | ORAL | Status: AC
  Filled 2016-02-17: qty 1

## 2016-02-17 MED ORDER — ONDANSETRON HCL 4 MG/2ML IJ SOLN
INTRAMUSCULAR | Status: DC | PRN
  Administered 2016-02-17: 4 mg via INTRAVENOUS

## 2016-02-17 MED ORDER — CHLORHEXIDINE GLUCONATE CLOTH 2 % EX PADS: 6.0000 | MEDICATED_PAD | Freq: Once | CUTANEOUS | Status: DC

## 2016-02-17 MED ORDER — ONDANSETRON HCL 4 MG/2ML IJ SOLN
INTRAMUSCULAR | Status: AC
  Filled 2016-02-17: qty 2

## 2016-02-17 MED ORDER — HYDROMORPHONE HCL 1 MG/ML IJ SOLN: 0.2500 mg | INTRAMUSCULAR | Status: DC | PRN

## 2016-02-17 MED ORDER — 0.9 % SODIUM CHLORIDE (POUR BTL) OPTIME
TOPICAL | Status: DC | PRN
  Administered 2016-02-17: 1000 mL

## 2016-02-17 MED ORDER — PROPOFOL 10 MG/ML IV BOLUS
INTRAVENOUS | Status: AC
  Filled 2016-02-17: qty 20

## 2016-02-17 MED ORDER — OXYCODONE-ACETAMINOPHEN 5-325 MG PO TABS
1.0000 | ORAL_TABLET | Freq: Once | ORAL | Status: AC
  Administered 2016-02-17: 1 via ORAL

## 2016-02-17 MED ORDER — FENTANYL CITRATE (PF) 100 MCG/2ML IJ SOLN
INTRAMUSCULAR | Status: DC | PRN
  Administered 2016-02-17 (×2): 25 ug via INTRAVENOUS
  Administered 2016-02-17 (×2): 50 ug via INTRAVENOUS
  Administered 2016-02-17: 100 ug via INTRAVENOUS

## 2016-02-17 MED ORDER — PROPOFOL 10 MG/ML IV BOLUS
INTRAVENOUS | Status: DC | PRN
  Administered 2016-02-17: 20 mg via INTRAVENOUS
  Administered 2016-02-17: 30 mg via INTRAVENOUS
  Administered 2016-02-17: 150 mg via INTRAVENOUS

## 2016-02-17 SURGICAL SUPPLY — 37 items
CANISTER SUCTION 2500CC (MISCELLANEOUS) ×2 IMPLANT
CANNULA VESSEL 3MM 2 BLNT TIP (CANNULA) ×1 IMPLANT
CLIP TI MEDIUM 6 (CLIP) ×2 IMPLANT
CLIP TI WIDE RED SMALL 6 (CLIP) ×2 IMPLANT
DECANTER SPIKE VIAL GLASS SM (MISCELLANEOUS) ×1 IMPLANT
ELECT REM PT RETURN 9FT ADLT (ELECTROSURGICAL) ×2
ELECTRODE REM PT RTRN 9FT ADLT (ELECTROSURGICAL) ×1 IMPLANT
GEL ULTRASOUND 20GR AQUASONIC (MISCELLANEOUS) IMPLANT
GLOVE BIO SURGEON STRL SZ 6.5 (GLOVE) ×2 IMPLANT
GLOVE BIO SURGEON STRL SZ7.5 (GLOVE) ×2 IMPLANT
GLOVE BIOGEL PI IND STRL 7.0 (GLOVE) IMPLANT
GLOVE BIOGEL PI IND STRL 7.5 (GLOVE) IMPLANT
GLOVE BIOGEL PI INDICATOR 7.0 (GLOVE) ×1
GLOVE BIOGEL PI INDICATOR 7.5 (GLOVE) ×1
GLOVE ECLIPSE 7.0 STRL STRAW (GLOVE) ×1 IMPLANT
GLOVE SURG SS PI 6.5 STRL IVOR (GLOVE) ×1 IMPLANT
GLOVE SURG SS PI 7.0 STRL IVOR (GLOVE) ×1 IMPLANT
GOWN STRL REUS W/ TWL LRG LVL3 (GOWN DISPOSABLE) ×3 IMPLANT
GOWN STRL REUS W/TWL LRG LVL3 (GOWN DISPOSABLE) ×8
GRAFT GORETEX STND 6X20 (Vascular Products) ×2 IMPLANT
GRAFT GORETEXSTD 6X20 (Vascular Products) IMPLANT
KIT BASIN OR (CUSTOM PROCEDURE TRAY) ×2 IMPLANT
KIT ROOM TURNOVER OR (KITS) ×2 IMPLANT
LIQUID BAND (GAUZE/BANDAGES/DRESSINGS) ×2 IMPLANT
LOOP VESSEL MINI RED (MISCELLANEOUS) IMPLANT
NDL HYPO 25GX1X1/2 BEV (NEEDLE) ×1 IMPLANT
NEEDLE HYPO 25GX1X1/2 BEV (NEEDLE) IMPLANT
NS IRRIG 1000ML POUR BTL (IV SOLUTION) ×2 IMPLANT
PACK CV ACCESS (CUSTOM PROCEDURE TRAY) ×2 IMPLANT
PAD ARMBOARD 7.5X6 YLW CONV (MISCELLANEOUS) ×4 IMPLANT
SPONGE SURGIFOAM ABS GEL 100 (HEMOSTASIS) IMPLANT
SUT PROLENE 6 0 CC (SUTURE) ×5 IMPLANT
SUT VIC AB 3-0 SH 27 (SUTURE) ×4
SUT VIC AB 3-0 SH 27X BRD (SUTURE) ×1 IMPLANT
SUT VICRYL 4-0 PS2 18IN ABS (SUTURE) ×3 IMPLANT
UNDERPAD 30X30 INCONTINENT (UNDERPADS AND DIAPERS) ×2 IMPLANT
WATER STERILE IRR 1000ML POUR (IV SOLUTION) ×2 IMPLANT

## 2016-02-17 NOTE — Transfer of Care (Signed)
Immediate Anesthesia Transfer of Care Note  Patient: Victoria [name]  Procedure(s) Performed: Procedure(s): REVISION OF ARTERIOVENOUS GORETEX GRAFT (Right)  Patient Location: PACU  Anesthesia Type:General  Level of Consciousness: awake, alert , oriented and patient cooperative  Airway & Oxygen Therapy: Patient Spontanous Breathing and Patient connected to nasal cannula oxygen  Post-op Assessment: Report given to RN and Post -op Vital signs reviewed and stable  Post vital signs: Reviewed and stable  Last Vitals:  Filed Vitals:   02/17/16 0843  BP: 135/78  Pulse: 78  Temp: 36.7 C  Resp: 16    Last Pain: There were no vitals filed for this visit.    Patients Stated Pain Goal: 5 ([id number])  Complications: No apparent anesthesia complications

## 2016-02-17 NOTE — Op Note (Signed)
Procedure: Revision right upper arm AV graft  Preoperative diagnosis: Aneurysmal degeneration right upper arm AV graft  Postoperative diagnosis: Same  Anesthesia: General  Asst. Samantha Rhyne PA-C  Operative findings: #1  6 mm PTFE interposition graft right upper arm  Operative details: After obtaining informed consent, the patient was taken to the operating room. The patient was placed in supine position on the operating room table. After induction of general anesthesia and placement of endotracheal tube the patient's entire right upper extremity was prepped and draped in a sterile fashion. Next a transverse incision was made just above and below a graft aneurysm that was on the medial aspect of the forearm from 7 to 11 o'clock. Each incision was carried down through the subcutaneous tissues down to level. The graft was dissected free circumferentially. The patient was given 8,000 units of intravenous heparin. The graft was clamped in the proximal incision as well as the distal incision with a fistula clamp. The graft was then divided in the proximal incision. There was some intimal hyperplasia  and degeneration of the graft but there was good arterial inflow. A new 6 mm graft was tunneled subcutaneously lateral to the old graft. This was then sewn end-to-end to the proximal graft using running 6-0 Prolene suture. The interposition graft was cut to length and sewn end-to-end to the venous limb of the graft again with a running 6-0 Prolene suture. Just prior to completion of the anastomosis everything was forebled backbled and thoroughly flushed. Anastomosis was secured, clamps released, and there was flow in the graft immediately. Hemostasis was obtained with direct pressure and the assistance of 80 mg of protamine. Subcutaneous tissues of both incisions reapproximated with a running 3-0 Vicryl suture. Skin of both incisions was closed with 4 0 Vicryl subcuticular stitch. Dermabond was applied  incisions. Patient tolerated the procedure well and there were no complications. Instrument, sponge and needle counts were correct at the end of the case. The patient was taken to the recovery room in stable condition.  [name], MD [facility] [place] Office: [phone] Pager: [phone]

## 2016-02-17 NOTE — Anesthesia Preprocedure Evaluation (Addendum)
Anesthesia Evaluation  Patient identified by MRN, date of birth, ID band Patient awake    Reviewed: Allergy & Precautions, NPO status , Patient's Chart, lab work & pertinent test results  History of Anesthesia Complications Negative for: history of anesthetic complications  Airway Mallampati: II  TM Distance: >3 FB Neck ROM: Full    Dental  (+) Dental Advisory Given, Missing, Poor Dentition   Pulmonary neg pulmonary ROS,    Pulmonary exam normal breath sounds clear to auscultation       Cardiovascular hypertension, (-) angina(-) Past MI Normal cardiovascular exam Rhythm:Regular Rate:Normal     Neuro/Psych negative neurological ROS  negative psych ROS   GI/Hepatic Neg liver ROS, GERD  Medicated,  Endo/Other  Secondary hyperparathyroidism s/p total parathyroidectomy in 2014  Obesity   Renal/GU Dialysis and ESRFRenal disease              Musculoskeletal negative musculoskeletal ROS (+)   Abdominal   Peds  Hematology negative hematology ROS (+)   Anesthesia Other Findings Day of surgery medications reviewed with the patient.  Reproductive/Obstetrics                            Lab Results  Component Value Date   WBC 14.8* 01/05/2016   HGB 13.7 01/05/2016   HCT 42.7 01/05/2016   MCV 82.0 01/05/2016   PLT 241 01/05/2016   Lab Results  Component Value Date   CREATININE 6.26* 01/05/2016   BUN 20 01/05/2016   NA 139 01/05/2016   K 3.9 01/05/2016   CL 99* 01/05/2016   CO2 28 01/05/2016    Anesthesia Physical  Anesthesia Plan  ASA: III  Anesthesia Plan: General   Post-op Pain Management:    Induction: Intravenous  Airway Management Planned: LMA  Additional Equipment:   Intra-op Plan:   Post-operative Plan: Extubation in OR  Informed Consent: I have reviewed the patients History and Physical, chart, labs and discussed the procedure including the risks, benefits and  alternatives for the proposed anesthesia with the patient or authorized representative who has indicated his/her understanding and acceptance.   Dental advisory given  Plan Discussed with: CRNA and Anesthesiologist  Anesthesia Plan Comments:        Anesthesia Quick Evaluation

## 2016-02-17 NOTE — Anesthesia Postprocedure Evaluation (Signed)
Anesthesia Post Note  Patient: Victoria [name]  Procedure(s) Performed: Procedure(s) (LRB): REVISION OF ARTERIOVENOUS GORETEX GRAFT (Right)  Patient location during evaluation: PACU Anesthesia Type: General Level of consciousness: sedated Pain management: pain level controlled Vital Signs Assessment: post-procedure vital signs reviewed and stable Respiratory status: spontaneous breathing and respiratory function stable Cardiovascular status: stable Anesthetic complications: no    Last Vitals:  Filed Vitals:   02/17/16 1330 02/17/16 1335  BP: 109/77 118/84  Pulse: 93 87  Temp:    Resp: 16 16    Last Pain:  Filed Vitals:   02/17/16 1338  PainSc: 6                  Joany Khatib DANIEL

## 2016-02-17 NOTE — Progress Notes (Signed)
Notified Dr. Tobias Alexander of ISTAT glucose no further orders patient without symptoms.

## 2016-02-17 NOTE — H&P (View-Only) (Signed)
Patient is a 60 year old female referred by Dr. 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 She was seen for similar problem October 2016 in January 2017. At that time the skin was intact and not compromised. The patient has had multiple revisions of her AV graft in 2011, 2012 and replacement of the arterial and venous limbs in 2014. She has had no bleeding episodes but the aneurysm is enlarging fairly rapidly. .  There is no eschar over the aneurysm. She states the skin over one of the aneurysms has become more thinned. She states they have been present for quite some time. Her dialysis today is Monday Wednesday Friday. Chronic medical problems include hypertension and bronchitis both of which are currently stable.  Review of systems: She denies shortness of breath. She denies chest pain.      Past Medical History    Diagnosis   Date    .   Seasonal allergies       .   H/O cardiovascular stress test             Myoview 08/24/12: Inferolateral and anteroseptal areas of scar, no findings of ischemia, EF 77%.    .   Dialysis patient ([facility])       .   Complication of anesthesia   ~ 2011          "they gave me a medicine that swolled me and mouth burning up" (08/23/2012)    .   Hypertension             Now controlled by HD    .   History of bronchitis             questions if she has it now bc throat is scratchy and coughing    .   ESRD on dialysis [facility])             M/W/F on [employer]        Past Surgical History    Procedure   Laterality   Date    .   Parathyroidectomy      2012          With implant to left forearm    .   Thyroidectomy, partial      2012          Isthmusectomy     .   Total abdominal hysterectomy      2007    .   Dilation and curettage of uterus      1970's          "when I was pregnant in my tubes" (08/23/2012)    .   Thrombectomy and revision of arterioventous (av) goretex  graft      2012, 2013    .   Dialysis fistula creation      1992          "left forearm"  (08/23/2012)    .   Arteriovenous graft placement      1990's; ~ 2011          "left upper arm; right forearm" (08/23/2012)    .   Ankle fracture surgery      ~ 2009          "right" (08/23/2012)    .   Revision of arteriovenous goretex graft      09/27/2012          Procedure: REVISION OF ARTERIOVENOUS GORETEX GRAFT;  Surgeon: Mal Misty, MD;  Location: [place];  Service: Vascular;  Laterality: Right;  1) Replacement of venous half of loop with 53mm Gortex graft  2) Excision of erroded pseudoaneurysm of graft with primary closure.    .   Patellectomy      10/03/2012          Procedure: PATELLECTOMY;  Surgeon: Marin Shutter, MD;  Location: [place];  Service: Orthopedics;  Laterality: Right;  RIGHT PARTIAL PATELLECTOMY AND PATELLA TENDON REPAIR    .   Patellar tendon repair      10/03/2012          Procedure: PATELLA TENDON REPAIR;  Surgeon: Marin Shutter, MD;  Location: [place];  Service: Orthopedics;  Laterality: Right;    .   Revision of arteriovenous goretex graft   Right   01/23/2013          Procedure: REVISION OF ARTERIOVENOUS GORETEX GRAFT;  Surgeon: Conrad [place], MD;  Location: [place];  Service: Vascular;  Laterality: Right;  Using piece of 12mm x 20cm Gortex graft.      Current Outpatient Prescriptions on File Prior to Visit    Medication   Sig   Dispense   Refill    .   pantoprazole (PROTONIX) 40 MG tablet   Take 1 tablet (40 mg total) by mouth daily.   30 tablet   1    .   predniSONE (DELTASONE) 10 MG tablet   Take 6 tablets (60 mg total) by mouth daily. Take 60 mg 1 day then 50 mg 1 day and then 40 mg 1 day then 30 mg 1 day then 20 mg 1 day then 10 mg 1 day then stop (Patient not taking: Reported on 07/03/2015)   21 tablet   0       No current facility-administered medications on file prior to visit.        Social History       Social History    .   Marital Status:   Married          Spouse Name:   N/A    .   Number of Children:   N/A    .   Years of Education:   N/A        Occupational History    .   Not on file.       Social History Main Topics    .   Smoking status:   Never Smoker     .   Smokeless tobacco:   Never Used    .   Alcohol Use:   No             Comment: quit 2007    .   Drug Use:   No    .   Sexual Activity:   Yes       Other Topics   Concern    .   Not on file       Social History Narrative        Family History    Problem   Relation   Age of Onset    .   Hypertension   Father       .   Thyroid disease   Father       .   Hypertension   Sister       .   Thyroid disease   Sister         Review of systems: She denies shortness of breath. She  denies chest pain.  Physical exam:     Filed Vitals:   01/28/16 1105  BP: 141/82  Pulse: 88  Temp: 97.6 F (36.4 C)  Resp: 14  Height: 5' 3.5" (1.613 m)  Weight: 189 lb (85.73 kg)  SpO2: 100%   Right upper extremity: Graft aneurysm at the 7:00 position right forearm ulnar aspect aneurysm is approximately 4 cm diameter audible bruit and graft   Assessment: Degenerative aneurysm right forearm AV graft; the ulnar limb of the graft has now had thinned out compromised at risk for bleeding  Plan: Revision right forearm AV graft Feb 17 2016. Risks benefits possible palpitations and procedure details were discussed with the patient today including not limited to bleeding infection graft thrombosis. She understands and agrees to proceed.   [name], MD [facility] Office: [phone] Pager: [phone]

## 2016-02-17 NOTE — Progress Notes (Signed)
Patient stated that she would stay here until she got awake enough to drive. Explained to patient that she would be unable to drive at all for 24 hours after surgery and stay by herself. Patient verbalized understanding stated that either her sister or her daughter in law would come and get her. She also stated that she had someone to stay with her tonight.

## 2016-02-17 NOTE — Interval H&P Note (Signed)
History and Physical Interval Note:  02/17/2016 10:44 AM  Virl Axe  has presented today for surgery, with the diagnosis of Right forearm arteriovenous graft aneurysm I72.9  The various methods of treatment have been discussed with the patient and family. After consideration of risks, benefits and other options for treatment, the patient has consented to  Procedure(s): REVISION OF ARTERIOVENOUS GORETEX GRAFT (Right) as a surgical intervention .  The patient's history has been reviewed, patient examined, no change in status, stable for surgery.  I have reviewed the patient's chart and labs.  Questions were answered to the patient's satisfaction.     [name]

## 2016-02-17 NOTE — Anesthesia Procedure Notes (Signed)
Procedure Name: LMA Insertion Date/Time: 02/17/2016 11:29 AM Performed by: Salli Quarry Page [place]con Pre-anesthesia Checklist: Patient identified, Emergency Drugs available, Suction available and Patient being monitored Patient Re-evaluated:Patient Re-evaluated prior to inductionOxygen Delivery Method: Circle System Utilized Preoxygenation: Pre-oxygenation with 100% oxygen Intubation Type: IV induction Ventilation: Mask ventilation without difficulty LMA: LMA inserted LMA Size: 4.0 Number of attempts: 1 Airway Equipment and Method: Bite block Placement Confirmation: positive ETCO2 Tube secured with: Tape Dental Injury: Teeth and Oropharynx as per pre-operative assessment

## 2016-02-17 NOTE — Interval H&P Note (Signed)
History and Physical Interval Note:  02/17/2016 10:47 AM  Virl Axe  has presented today for surgery, with the diagnosis of Right forearm arteriovenous graft aneurysm I72.9  The various methods of treatment have been discussed with the patient and family. After consideration of risks, benefits and other options for treatment, the patient has consented to  Procedure(s): REVISION OF ARTERIOVENOUS GORETEX GRAFT (Right) as a surgical intervention .  The patient's history has been reviewed, patient examined, no change in status, stable for surgery.  I have reviewed the patient's chart and labs.  Questions were answered to the patient's satisfaction.     [name]

## 2016-02-17 NOTE — Discharge Instructions (Signed)
° ° °  02/17/2016 Victoria [name] [id number] Apr 24, 1956  Surgeon(s): Elam Dutch, MD  Procedure(s): REVISION OF ARTERIOVENOUS GORETEX GRAFT  x May stick graft on designated area only:  -Do NOT stick the graft medially between the incisions x 4 weeks.  -May stick the lateral portion of the graft immediately. SEE DIAGRAM.

## 2016-02-19 ENCOUNTER — Encounter (HOSPITAL_COMMUNITY): Payer: Self-pay | Admitting: Vascular Surgery

## 2016-03-07 ENCOUNTER — Encounter (HOSPITAL_COMMUNITY): Payer: Self-pay | Admitting: Emergency Medicine

## 2016-03-07 ENCOUNTER — Emergency Department (HOSPITAL_COMMUNITY)
Admission: EM | Admit: 2016-03-07 | Discharge: 2016-03-07 | Disposition: A | Discharge status: Home / Self Care | Payer: Medicare Other | Attending: Emergency Medicine | Admitting: Emergency Medicine

## 2016-03-07 DIAGNOSIS — Z79899 Other long term (current) drug therapy: Secondary | ICD-10-CM | POA: Diagnosis not present

## 2016-03-07 DIAGNOSIS — R109 Unspecified abdominal pain: Secondary | ICD-10-CM | POA: Diagnosis present

## 2016-03-07 DIAGNOSIS — Z7982 Long term (current) use of aspirin: Secondary | ICD-10-CM | POA: Diagnosis not present

## 2016-03-07 DIAGNOSIS — R079 Chest pain, unspecified: Secondary | ICD-10-CM | POA: Diagnosis not present

## 2016-03-07 DIAGNOSIS — R52 Pain, unspecified: Secondary | ICD-10-CM

## 2016-03-07 DIAGNOSIS — I120 Hypertensive chronic kidney disease with stage 5 chronic kidney disease or end stage renal disease: Secondary | ICD-10-CM | POA: Insufficient documentation

## 2016-03-07 DIAGNOSIS — N186 End stage renal disease: Secondary | ICD-10-CM | POA: Insufficient documentation

## 2016-03-07 MED ORDER — KETOROLAC TROMETHAMINE 30 MG/ML IJ SOLN
30.0000 mg | Freq: Once | INTRAMUSCULAR | Status: AC
  Administered 2016-03-07: 30 mg via INTRAMUSCULAR
  Filled 2016-03-07: qty 1

## 2016-03-07 NOTE — ED Provider Notes (Signed)
CSN: [id number]     Arrival date & time 03/07/16  [id number] History   First MD Initiated Contact with Patient 03/07/16 [phone]     Chief Complaint  Patient presents with  . Flank Pain     (Consider location/radiation/quality/duration/timing/severity/associated sxs/prior Treatment) HPI   Patient is a 60 year old female with a history of ESRD, HTN, GERD who presents the ED with progressively worsening left-sided flank pain for 2 days. Patient believes she slept wrong on her side because she woke Friday morning with pain to her left side. She is the pain is sharp, constant, nonradiating, 8/10, worse with sitting down and rising from a seated position. Patient has taken Bayer aspirin with little relief. She denies trauma, diarrhea, nausea, vomiting, fever, chills, change in bowel habits, hematochezia, chest pain, shortness of breath, abdominal pain.   Past Medical History  Diagnosis Date  . Secondary hyperparathyroidism, renal ([place])     s/p  total parathyroidectomy  2014  . History of acute pulmonary edema     2003  . Left patella fracture   . Wears glasses   . Seasonal allergies   . Chronic bronchitis ([place])   . ESRD (end stage renal disease) on dialysis [facility]) Nephrologist-- dr deterding    ESRD due to HTN-; "M/W/F; [place]" (11/28/2015  . Hypertension     no longer on medications  . Pneumonia     as a child  . Thyroid disease   . GERD (gastroesophageal reflux disease)   . Complication of anesthesia ~ 2011    "they gave me a medicine that swolled me and mouth burning up" (08/23/2012)  . Complication of anesthesia     slow to wake up   Past Surgical History  Procedure Laterality Date  . Dialysis fistula creation  1992    left upper arm ---  w/  Multiple Revision's until 2006  . Revision of arteriovenous goretex graft  09/27/2012    Procedure: REVISION OF ARTERIOVENOUS GORETEX GRAFT;  Surgeon: Mal Misty, MD;  Location: [facility] OR;  Service: Vascular;  Laterality: Right;  1) Replacement  of venous half of loop with 32mm Gortex graft  2) Excision of erroded pseudoaneurysm of graft with primary closure.  . Patellectomy  10/03/2012    Procedure: PATELLECTOMY;  Surgeon: Marin Shutter, MD;  Location: [place];  Service: Orthopedics;  Laterality: Right;  RIGHT PARTIAL PATELLECTOMY AND PATELLA TENDON REPAIR  . Patellar tendon repair  10/03/2012    Procedure: PATELLA TENDON REPAIR;  Surgeon: Marin Shutter, MD;  Location: [place];  Service: Orthopedics;  Laterality: Right;  . Revision of arteriovenous goretex graft Right 01/23/2013    Procedure: REVISION OF ARTERIOVENOUS GORETEX GRAFT;  Surgeon: Conrad Stratford, MD;  Location: [place];  Service: Vascular;  Laterality: Right;  Using piece of 57mm x 20cm Gortex graft.   . Revision of arteriovenous goretex graft Right 10/07/2015    Procedure: REVISION OF Right arm ARTERIOVENOUS GORETEX GRAFT;  Surgeon: Elam Dutch, MD;  Location: [place];  Service: Vascular;  Laterality: Right;  . Total parathyroidectomy/  thyroid isthmusectomy/  autotransplantation parathyroid tissue to left brachioriadialis muscle  02-18-2011  . Ankle fracture surgery Right ~ 2005    "has pins in it"  . Ectopic pregnancy surgery  1970's  . Arteriovenous graft placement  03-25-2005    Right forearm  w/  multiple Revision's   . Transthoracic echocardiogram  10-31-2012    mild LVH,  grade 1 diastolic dysfunction,  ef 55-65%/  mild MR/  trivial TR  . Cardiovascular stress test  08-24-2012    abnormal nuclear study/  inferolateral and anteroseptal areas of scar,  no ischemia/  normal LV function and wall motion, ef 77%  . Orif patella Left 10/31/2015    Procedure: OPEN REDUCTION INTERNAL (ORIF) FIXATION LEFT PATELLA;  Surgeon: Rod Can, MD;  Location: [place];  Service: Orthopedics;  Laterality: Left;  . Incision and drainage of wound Left 11/28/2015    knee  . Application of wound vac Left 11/28/2015    knee  . Total abdominal hysterectomy  03-05-2005    w/   Right Ovarian Cystectomy  . Fracture surgery    . I&d extremity Left 11/28/2015    Procedure: IRRIGATION AND DEBRIDEMENT LEFT KNEE WOUND ;  Surgeon: Rod Can, MD;  Location: [place];  Service: Orthopedics;  Laterality: Left;  . Application of wound vac Left 11/28/2015    Procedure: APPLICATION OF WOUND VAC;  Surgeon: Rod Can, MD;  Location: [place];  Service: Orthopedics;  Laterality: Left;  . Revision of arteriovenous goretex graft Right 02/17/2016    Procedure: REVISION OF ARTERIOVENOUS GORETEX GRAFT;  Surgeon: Elam Dutch, MD;  Location: [place];  Service: Vascular;  Laterality: Right;   Family History  Problem Relation Age of Onset  . Hypertension Father   . Thyroid disease Father   . Hypertension Sister   . Thyroid disease Sister    Social History  Substance Use Topics  . Smoking status: Never Smoker   . Smokeless tobacco: Never Used  . Alcohol Use: 0.0 oz/week    0 Standard drinks or equivalent per week     Comment: quit 2007   OB History    No data available     Review of Systems  Constitutional: Negative for fever and chills.  Respiratory: Negative for cough and shortness of breath.   Cardiovascular: Negative for chest pain.  Gastrointestinal: Negative for nausea, vomiting, abdominal pain and diarrhea.  Genitourinary: Positive for flank pain.  Musculoskeletal: Negative for back pain and neck pain.  Neurological: Negative for dizziness, syncope, weakness, numbness and headaches.      Allergies  Amlodipine and Lisinopril  Home Medications   Prior to Admission medications   Medication Sig Start Date End Date Taking? Authorizing Provider  aspirin 81 MG tablet Take 1 tablet (81 mg total) by mouth 2 (two) times daily after a meal. Reported on 10/02/2015 10/31/15  Yes Rod Can, MD  calcium acetate (PHOSLO) 667 MG capsule Take 667 mg by mouth 3 (three) times daily with meals.   Yes Historical Provider, MD  omeprazole (PRILOSEC) 20 MG capsule Take 20 mg by  mouth daily. 12/26/15  Yes Historical Provider, MD   BP 107/75 mmHg  Pulse 80  Temp(Src) 97.1 F (36.2 C) (Oral)  Resp 14  SpO2 100% Physical Exam  Constitutional: She appears well-developed and well-nourished. No distress.  HENT:  Head: Normocephalic and atraumatic.  Eyes: Conjunctivae are normal.  Neck: Normal range of motion.  Cardiovascular: Normal rate and regular rhythm.  Exam reveals no gallop and no friction rub.   No murmur heard. Pulses:      Dorsalis pedis pulses are 2+ on the right side, and 2+ on the left side.  Pulmonary/Chest: Effort normal and breath sounds normal. No respiratory distress. She has no wheezes. She has no rales. She exhibits tenderness.    Examination of chest wall and back revealed no deformities, ecchymosis, TTP 2 left lateral rib  cage  Abdominal: Soft. Bowel sounds are normal. She exhibits no distension. There is no tenderness. There is no rebound and no guarding.  Neurological: She is alert. Coordination normal.  Skin: She is not diaphoretic.  Nursing note and vitals reviewed.   ED Course  Procedures (including critical care time) Labs Review Labs Reviewed - No data to display  Imaging Review No results found. I have personally reviewed and evaluated these images and lab results as part of my medical decision-making.   EKG Interpretation None      MDM   Final diagnoses:  Pain   Pt with left sided flank pain. Pain reproducible on palpation. Likely costochondritis as pain was in the lower aspect of the ribs. Supportive treatment discussed with pt. Pt pain controlled in the ED with Toradol. Less likely intraabdominal etiology as pt is without abdominal tenderness, nausea, vomiting, diarrhea or urinary complaints. Pain with movement and reproducible on exam.  Instructed pt to f/u with PCP within 2 days or sooner if symptoms worsen. Discussed strict return precautions. Pt expressed understanding to the discharge instructions.      Kalman Drape, [place] 03/08/16 [id number]  Carmin Muskrat, MD 03/08/16 [phone]

## 2016-03-07 NOTE — Discharge Instructions (Signed)
Follow-up with your primary care provider within 2 days to be seen regarding your visit to the emergency department today. Take Tylenol for pain. Alternate ice and heat on the affected area.  Return to emergency department if your pain worsens, the pain moves, you experience numbness/tingling or weakness of your legs or arms, fever, chills.

## 2016-03-07 NOTE — ED Notes (Signed)
Patient complains of left sided flank pain since Friday.  Patient states it happened about a year ago and she was given a medicine she cannot recall the name of and the pain went away.  Patient alert and oriented at this time.

## 2016-03-31 ENCOUNTER — Emergency Department (HOSPITAL_COMMUNITY)
Admission: EM | Admit: 2016-03-31 | Discharge: 2016-03-31 | Disposition: A | Discharge status: Home / Self Care | Payer: Medicare Other | Attending: Emergency Medicine | Admitting: Emergency Medicine

## 2016-03-31 ENCOUNTER — Encounter (HOSPITAL_COMMUNITY): Payer: Self-pay | Admitting: Emergency Medicine

## 2016-03-31 ENCOUNTER — Emergency Department (HOSPITAL_COMMUNITY): Payer: Medicare Other

## 2016-03-31 DIAGNOSIS — Z7982 Long term (current) use of aspirin: Secondary | ICD-10-CM | POA: Diagnosis not present

## 2016-03-31 DIAGNOSIS — Y999 Unspecified external cause status: Secondary | ICD-10-CM | POA: Insufficient documentation

## 2016-03-31 DIAGNOSIS — W0110XA Fall on same level from slipping, tripping and stumbling with subsequent striking against unspecified object, initial encounter: Secondary | ICD-10-CM | POA: Insufficient documentation

## 2016-03-31 DIAGNOSIS — S81012A Laceration without foreign body, left knee, initial encounter: Secondary | ICD-10-CM | POA: Insufficient documentation

## 2016-03-31 DIAGNOSIS — S8992XA Unspecified injury of left lower leg, initial encounter: Secondary | ICD-10-CM | POA: Diagnosis present

## 2016-03-31 DIAGNOSIS — N186 End stage renal disease: Secondary | ICD-10-CM | POA: Diagnosis not present

## 2016-03-31 DIAGNOSIS — Z992 Dependence on renal dialysis: Secondary | ICD-10-CM | POA: Diagnosis not present

## 2016-03-31 DIAGNOSIS — J449 Chronic obstructive pulmonary disease, unspecified: Secondary | ICD-10-CM | POA: Insufficient documentation

## 2016-03-31 DIAGNOSIS — I120 Hypertensive chronic kidney disease with stage 5 chronic kidney disease or end stage renal disease: Secondary | ICD-10-CM | POA: Insufficient documentation

## 2016-03-31 DIAGNOSIS — Y9301 Activity, walking, marching and hiking: Secondary | ICD-10-CM | POA: Insufficient documentation

## 2016-03-31 DIAGNOSIS — Y929 Unspecified place or not applicable: Secondary | ICD-10-CM | POA: Diagnosis not present

## 2016-03-31 MED ORDER — TETANUS-DIPHTH-ACELL PERTUSSIS 5-2.5-18.5 LF-MCG/0.5 IM SUSP
0.5000 mL | Freq: Once | INTRAMUSCULAR | Status: AC
  Administered 2016-03-31: 0.5 mL via INTRAMUSCULAR
  Filled 2016-03-31: qty 0.5

## 2016-03-31 MED ORDER — LIDOCAINE-EPINEPHRINE (PF) 2 %-1:200000 IJ SOLN
10.0000 mL | Freq: Once | INTRAMUSCULAR | Status: AC
  Administered 2016-03-31: 10 mL
  Filled 2016-03-31: qty 20

## 2016-03-31 MED ORDER — CEPHALEXIN 500 MG PO CAPS: 500.0000 mg | ORAL_CAPSULE | Freq: Four times a day (QID) | ORAL | Status: DC

## 2016-03-31 NOTE — ED Notes (Signed)
Patient able to ambulate independently  

## 2016-03-31 NOTE — Discharge Instructions (Signed)
Please read and follow all provided instructions.  Your diagnoses today include:  1. Knee laceration, left, initial encounter    Tests performed today include:  X-ray of the affected area that did not show any foreign bodies or broken bones  Vital signs. See below for your results today.   Medications prescribed:   Take any prescribed medications only as directed.   Home care instructions:  Follow any educational materials and wound care instructions contained in this packet.   You may shower and wash the area with soap and water, just be sure to pat the area dry and not rub over the stitches. Do no put your stiches underwater (in a bath, pool, or lake). Getting stiches wet can slow down healing and increase your chances of getting an infection. You may apply Bacitracin or Neosporin twice a day for 7 days, and keep the ara clean with  bandage or gauze. Do not apply alcohol or hydrogen peroxide. Cover the area if it draining or weeping.   Follow-up instructions: Suture Removal: Return to the Emergency Department or see your primary care care doctor in 10-14 days for a recheck of your wound and removal of your sutures or staples.    Return instructions:  Return to the Emergency Department if you have:  Fever  Worsening pain  Worsening swelling of the wound  Pus draining from the wound  Redness of the skin that moves away from the wound, especially if it streaks away from the affected area   Any other emergent concerns  Your vital signs today were: BP 108/83 mmHg   Pulse 94   Temp(Src) 98.6 F (37 C) (Oral)   Resp 16   SpO2 100% If your blood pressure (BP) was elevated above 135/85 this visit, please have this repeated by your doctor within one month. --------------

## 2016-03-31 NOTE — ED Notes (Signed)
Pt presents to ED for assessment of left knee pain and right elbow pain after a fall.  Pt uses a cane to walk, and pt sts she hit the side of a ramp on the sidewalk and tripped and fell.  Denies LOC, denies head injury.  Pt sts she had knee surgery x 8 months ago, and has "reopened the scar"

## 2016-03-31 NOTE — ED Provider Notes (Signed)
CSN: [id number]     Arrival date & time 03/31/16  1653 History   By signing my name below, I, Meriel Flavors, attest that this documentation has been prepared under the direction and in the presence of Shary Decamp PA-C.  Electronically Signed: Meriel Flavors, ED Scribe. 03/31/2016. 5:34 PM   Chief Complaint  Patient presents with  . Knee Pain  . Elbow Pain  . Laceration   Patient is a 60 y.o. female presenting with skin laceration. The history is provided by the patient. No language interpreter was used.  Laceration  HPI Comments: KARSON SLATER is a 60 y.o. female with a PMHx of DM, who presents to the Emergency Department complaining of 10/10 left knee pain s/p a fall that occurred earlier today. Pt states she was walking outside of a store today and tripped on the side of a ramp. Pt state she fell and landed directly onto her left knee. Pt denies any head injury or loss of consciousness. Pt also states she had surgery to the left knee 8 months ago with hardware placed. Pt states surgery was with [employer]. Pt reports difficulty ambulating s/p and has been using a cane to ambulate since the fall. Pt states her pain is exacerbated by bending the extremity. Pt also reports that it has been bleeding "non-stop" all day today. Pt receives Dialysis treatment on MWF and states that she was given Heparin this morning at treatment; which she believes has caused the excessive bleeding. Pt also states she struck her right elbow when she fell and complains of soreness to her posterior humerus  Pt does not recall her last Tetanus. Pt has not taken any medication for pain PTA.   Past Medical History  Diagnosis Date  . Secondary hyperparathyroidism, renal ([place])     s/p  total parathyroidectomy  2014  . History of acute pulmonary edema     2003  . Left patella fracture   . Wears glasses   . Seasonal allergies   . Chronic bronchitis ([place])   . Hypertension     no longer on medications  . Pneumonia      as a child  . Thyroid disease   . GERD (gastroesophageal reflux disease)   . Complication of anesthesia ~ 2011    "they gave me a medicine that swolled me and mouth burning up" (08/23/2012)  . Complication of anesthesia     slow to wake up  . ESRD (end stage renal disease) on dialysis [facility]) Nephrologist-- dr deterding    ESRD due to HTN-; "M/W/F; [place]" (11/28/2015   Past Surgical History  Procedure Laterality Date  . Dialysis fistula creation  1992    left upper arm ---  w/  Multiple Revision's until 2006  . Revision of arteriovenous goretex graft  09/27/2012    Procedure: REVISION OF ARTERIOVENOUS GORETEX GRAFT;  Surgeon: Mal Misty, MD;  Location: [facility] OR;  Service: Vascular;  Laterality: Right;  1) Replacement of venous half of loop with 76mm Gortex graft  2) Excision of erroded pseudoaneurysm of graft with primary closure.  . Patellectomy  10/03/2012    Procedure: PATELLECTOMY;  Surgeon: Marin Shutter, MD;  Location: [place];  Service: Orthopedics;  Laterality: Right;  RIGHT PARTIAL PATELLECTOMY AND PATELLA TENDON REPAIR  . Patellar tendon repair  10/03/2012    Procedure: PATELLA TENDON REPAIR;  Surgeon: Marin Shutter, MD;  Location: [place];  Service: Orthopedics;  Laterality: Right;  . Revision of arteriovenous  goretex graft Right 01/23/2013    Procedure: REVISION OF ARTERIOVENOUS GORETEX GRAFT;  Surgeon: Conrad Morgan, MD;  [facility] OR;  Service: Vascular;  Laterality: Right;  Using piece of 55mm x 20cm Gortex graft.   . Revision of arteriovenous goretex graft Right 10/07/2015    Procedure: REVISION OF Right arm ARTERIOVENOUS GORETEX GRAFT;  Surgeon: Elam Dutch, MD;  Location: [place];  Service: Vascular;  Laterality: Right;  . Total parathyroidectomy/  thyroid isthmusectomy/  autotransplantation parathyroid tissue to left brachioriadialis muscle  02-18-2011  . Ankle fracture surgery Right ~ 2005    "has pins in it"  . Ectopic pregnancy surgery  1970's  . Arteriovenous graft  placement  03-25-2005    Right forearm  w/  multiple Revision's   . Transthoracic echocardiogram  10-31-2012    mild LVH,  grade 1 diastolic dysfunction,  ef 55-65%/  mild MR/  trivial TR  . Cardiovascular stress test  08-24-2012    abnormal nuclear study/  inferolateral and anteroseptal areas of scar,  no ischemia/  normal LV function and wall motion, ef 77%  . Orif patella Left 10/31/2015    Procedure: OPEN REDUCTION INTERNAL (ORIF) FIXATION LEFT PATELLA;  Surgeon: Rod Can, MD;  Location: [place];  Service: Orthopedics;  Laterality: Left;  . Incision and drainage of wound Left 11/28/2015    knee  . Application of wound vac Left 11/28/2015    knee  . Total abdominal hysterectomy  03-05-2005    w/  Right Ovarian Cystectomy  . Fracture surgery    . I&d extremity Left 11/28/2015    Procedure: IRRIGATION AND DEBRIDEMENT LEFT KNEE WOUND ;  Surgeon: Rod Can, MD;  Location: [place];  Service: Orthopedics;  Laterality: Left;  . Application of wound vac Left 11/28/2015    Procedure: APPLICATION OF WOUND VAC;  Surgeon: Rod Can, MD;  Location: [place];  Service: Orthopedics;  Laterality: Left;  . Revision of arteriovenous goretex graft Right 02/17/2016    Procedure: REVISION OF ARTERIOVENOUS GORETEX GRAFT;  Surgeon: Elam Dutch, MD;  Location: [place];  Service: Vascular;  Laterality: Right;   Family History  Problem Relation Age of Onset  . Hypertension Father   . Thyroid disease Father   . Hypertension Sister   . Thyroid disease Sister    Social History  Substance Use Topics  . Smoking status: Never Smoker   . Smokeless tobacco: Never Used  . Alcohol Use: No     Comment: quit 2007   OB History    No data available     Review of Systems  Musculoskeletal: Positive for arthralgias (right upper extremity, left knee).  Skin: Positive for wound (right knee).  Neurological: Negative for syncope.  All other systems reviewed and are negative.  Allergies   Amlodipine and Lisinopril  Home Medications   Prior to Admission medications   Medication Sig Start Date End Date Taking? Authorizing Provider  aspirin 81 MG tablet Take 1 tablet (81 mg total) by mouth 2 (two) times daily after a meal. Reported on 10/02/2015 10/31/15   Rod Can, MD  calcium acetate (PHOSLO) 667 MG capsule Take 667 mg by mouth 3 (three) times daily with meals.    Historical Provider, MD  omeprazole (PRILOSEC) 20 MG capsule Take 20 mg by mouth daily. 12/26/15   Historical Provider, MD   BP 108/83 mmHg  Pulse 94  Temp(Src) 98.6 F (37 C) (Oral)  Resp 16  SpO2 100%  Physical Exam  Constitutional: She is oriented to person, place, and time. She appears well-developed and well-nourished. No distress.  HENT:  Head: Normocephalic and atraumatic.  Eyes: EOM are normal.  Neck: Normal range of motion.  Cardiovascular: Normal rate and regular rhythm.   Pulmonary/Chest: Effort normal.  Abdominal: Soft.  Musculoskeletal: Normal range of motion. She exhibits tenderness.       Left knee: She exhibits laceration. She exhibits normal range of motion, no swelling, no LCL laxity and no MCL laxity. Tenderness found.  Neurovascularly intact, TTP posterior aspect of her right dorsal humerus. Left knee with 7cm curved laceration. Bleeding controlled. No signs of infection. No erythema.    Neurological: She is alert and oriented to person, place, and time.  Skin: Skin is warm and dry. She is not diaphoretic.  Psychiatric: She has a normal mood and affect. Her behavior is normal. Judgment and thought content normal.  Nursing note and vitals reviewed.  ED Course  .Marland KitchenLaceration Repair Date/Time: 03/31/2016 8:20 PM Performed by: Shary Decamp Authorized by: Shary Decamp Consent: Verbal consent obtained. Risks and benefits: risks, benefits and alternatives were discussed Consent given by: patient Patient understanding: patient states understanding of the procedure being  performed Patient consent: the patient's understanding of the procedure matches consent given Patient identity confirmed: verbally with patient and arm band Body area: lower extremity Location details: left knee Laceration length: 7 cm Foreign bodies: no foreign bodies Tendon involvement: none Nerve involvement: none Vascular damage: no Anesthesia: local infiltration Local anesthetic: lidocaine 1% with epinephrine Anesthetic total: 8 ml Preparation: Patient was prepped and draped in the usual sterile fashion. Irrigation solution: saline Irrigation method: jet lavage Amount of cleaning: standard Debridement: minimal Degree of undermining: none Skin closure: 3-0 Prolene Number of sutures: 12 Technique: simple Approximation: close Approximation difficulty: simple Dressing: 4x4 sterile gauze and antibiotic ointment    DIAGNOSTIC STUDIES: Oxygen Saturation is 100% on RA, normal by my interpretation.  COORDINATION OF CARE: 5:34 PM-Will order imaging. Discussed treatment plan with pt at bedside and pt agreed to plan.   Labs Review Labs Reviewed - No data to display  Imaging Review Dg Elbow Complete Right  03/31/2016  CLINICAL DATA:  Status post fall.  Right elbow pain. EXAM: RIGHT ELBOW - COMPLETE 3+ VIEW COMPARISON:  None. FINDINGS: There is no evidence of fracture, dislocation, or joint effusion. There is no evidence of arthropathy or other focal bone abnormality. Soft tissues are unremarkable. Extensive vascular calcifications in the region of the dialysis graft. IMPRESSION: No acute osseous injury of the right elbow. Electronically Signed   By: Kathreen Devoid   On: 03/31/2016 18:22   Dg Knee Complete 4 Views Left  03/31/2016  CLINICAL DATA:  Fall. Laceration to the left knee. Left-sided patellar surgery 6 months ago. EXAM: LEFT KNEE - COMPLETE 4+ VIEW COMPARISON:  Left knee radiographs 10/22/2015 FINDINGS: The left knee is located. There is no significant joint effusion.  Previously noted patellar fracture has healed. There is prepatellar soft tissue swelling inferior to the patella. No radiopaque foreign body is present. IMPRESSION: 1. Soft tissue swelling anterior and inferior to the patella without underlying fracture. 2. Previous patellar fracture has been repaired. 3. No acute osseous abnormality. Electronically Signed   By: San Morelle M.D.   On: 03/31/2016 18:16   I have personally reviewed and evaluated these images and lab results as part of my medical decision-making.   EKG Interpretation None      MDM  I have reviewed  and evaluated the relevant imaging studies.  I have reviewed the relevant previous healthcare records. I obtained HPI from historian.  ED Course:  Assessment: Pt is a 59yF with hx DM, ESRD who presents with left knee pain s/p mechanical fall. On exam, pt in NAD. Nontoxic/nonseptic appearing. VSS. Afebrile. Left knee with 7 cm laceration. Bleeding controlled. Wound cleaned with bottom of wound visualized. No signs of erythema or infection. Repaired with 12 Prolene. Given Rx ABX. Imaging with soft tissue swelling without underlying fracture. Plan is to [place] with follow up to PCP for removal of sutures 10-14 days. At time of discharge, Patient is in no acute distress. Vital Signs are stable. Patient is able to ambulate. Patient able to tolerate PO.    Disposition/Plan:  DC Home Additional Verbal discharge instructions given and discussed with patient.  Pt Instructed to f/u with PCP in the next week for evaluation and treatment of symptoms. Return precautions given Pt acknowledges and agrees with plan  Supervising Physician Leo Grosser, MD   Final diagnoses:  Knee laceration, left, initial encounter   I personally performed the services described in this documentation, which was scribed in my presence. The recorded information has been reviewed and is accurate.    Shary Decamp, PA-C 03/31/16 2021  Leo Grosser,  MD 04/01/16 [phone]

## 2016-04-05 ENCOUNTER — Other Ambulatory Visit: Payer: Self-pay | Admitting: Nephrology

## 2016-04-05 DIAGNOSIS — Z1231 Encounter for screening mammogram for malignant neoplasm of breast: Secondary | ICD-10-CM

## 2016-04-07 ENCOUNTER — Emergency Department (HOSPITAL_COMMUNITY)
Admission: EM | Admit: 2016-04-07 | Discharge: 2016-04-07 | Disposition: A | Discharge status: Home / Self Care | Payer: Medicare Other | Attending: Emergency Medicine | Admitting: Emergency Medicine

## 2016-04-07 ENCOUNTER — Encounter (HOSPITAL_COMMUNITY): Payer: Self-pay | Admitting: *Deleted

## 2016-04-07 DIAGNOSIS — Z4802 Encounter for removal of sutures: Secondary | ICD-10-CM

## 2016-04-07 DIAGNOSIS — I120 Hypertensive chronic kidney disease with stage 5 chronic kidney disease or end stage renal disease: Secondary | ICD-10-CM | POA: Diagnosis not present

## 2016-04-07 DIAGNOSIS — Z7982 Long term (current) use of aspirin: Secondary | ICD-10-CM | POA: Insufficient documentation

## 2016-04-07 DIAGNOSIS — Z992 Dependence on renal dialysis: Secondary | ICD-10-CM | POA: Insufficient documentation

## 2016-04-07 DIAGNOSIS — N186 End stage renal disease: Secondary | ICD-10-CM | POA: Diagnosis not present

## 2016-04-07 DIAGNOSIS — Z79899 Other long term (current) drug therapy: Secondary | ICD-10-CM | POA: Insufficient documentation

## 2016-04-07 NOTE — ED Notes (Signed)
PT presents today for removal of sutures located in LT knee.

## 2016-04-07 NOTE — ED Provider Notes (Signed)
CSN: [id number]     Arrival date & time 04/07/16  1053 History  By signing my name below, I, Evelene Croon, attest that this documentation has been prepared under the direction and in the presence of non-physician practitioner, Quincy Carnes, PA-C. Electronically Signed: Evelene Croon, Scribe. 04/07/2016. 11:06 AM.    Chief Complaint  Patient presents with  . Suture / Staple Removal   The history is provided by the patient. No language interpreter was used.     HPI Comments:  Victoria [name] is a 60 y.o. female who presents to the Emergency Department for suture removal. Pt was seen in the ED on 03/31/16 following injury to the left knee s/p fall. She had 12 sutures placed and was advised to return in 7 days to have the sutures removed. Her tetanus was updated during that visit and she was discharged with antibiotics which she has been taking. She notes mild pain to the left knee. Pt has no acute physical complaints or symptoms at this time.  States wound has been healing well.  No drainage or fever.    Past Medical History  Diagnosis Date  . Secondary hyperparathyroidism, renal ([place])     s/p  total parathyroidectomy  2014  . History of acute pulmonary edema     2003  . Left patella fracture   . Wears glasses   . Seasonal allergies   . Chronic bronchitis ([place])   . Hypertension     no longer on medications  . Pneumonia     as a child  . Thyroid disease   . GERD (gastroesophageal reflux disease)   . Complication of anesthesia ~ 2011    "they gave me a medicine that swolled me and mouth burning up" (08/23/2012)  . Complication of anesthesia     slow to wake up  . ESRD (end stage renal disease) on dialysis [facility]) Nephrologist-- dr deterding    ESRD due to HTN-; "M/W/F; [place]" (11/28/2015   Past Surgical History  Procedure Laterality Date  . Dialysis fistula creation  1992    left upper arm ---  w/  Multiple Revision's until 2006  . Revision of arteriovenous goretex graft   09/27/2012    Procedure: REVISION OF ARTERIOVENOUS GORETEX GRAFT;  Surgeon: Mal Misty, MD;  Location: [facility] OR;  Service: Vascular;  Laterality: Right;  1) Replacement of venous half of loop with 46mm Gortex graft  2) Excision of erroded pseudoaneurysm of graft with primary closure.  . Patellectomy  10/03/2012    Procedure: PATELLECTOMY;  Surgeon: Marin Shutter, MD;  Location: [place];  Service: Orthopedics;  Laterality: Right;  RIGHT PARTIAL PATELLECTOMY AND PATELLA TENDON REPAIR  . Patellar tendon repair  10/03/2012    Procedure: PATELLA TENDON REPAIR;  Surgeon: Marin Shutter, MD;  Location: [place];  Service: Orthopedics;  Laterality: Right;  . Revision of arteriovenous goretex graft Right 01/23/2013    Procedure: REVISION OF ARTERIOVENOUS GORETEX GRAFT;  Surgeon: Conrad Chesterfield, MD;  Location: [place];  Service: Vascular;  Laterality: Right;  Using piece of 29mm x 20cm Gortex graft.   . Revision of arteriovenous goretex graft Right 10/07/2015    Procedure: REVISION OF Right arm ARTERIOVENOUS GORETEX GRAFT;  Surgeon: Elam Dutch, MD;  Location: [employer];  Service: Vascular;  Laterality: Right;  . Total parathyroidectomy/  thyroid isthmusectomy/  autotransplantation parathyroid tissue to left brachioriadialis muscle  02-18-2011  . Ankle fracture surgery Right ~ 2005    "has  pins in it"  . Ectopic pregnancy surgery  1970's  . Arteriovenous graft placement  03-25-2005    Right forearm  w/  multiple Revision's   . Transthoracic echocardiogram  10-31-2012    mild LVH,  grade 1 diastolic dysfunction,  ef 55-65%/  mild MR/  trivial TR  . Cardiovascular stress test  08-24-2012    abnormal nuclear study/  inferolateral and anteroseptal areas of scar,  no ischemia/  normal LV function and wall motion, ef 77%  . Orif patella Left 10/31/2015    Procedure: OPEN REDUCTION INTERNAL (ORIF) FIXATION LEFT PATELLA;  Surgeon: Rod Can, MD;  Location: [place];  Service: Orthopedics;  Laterality:  Left;  . Incision and drainage of wound Left 11/28/2015    knee  . Application of wound vac Left 11/28/2015    knee  . Total abdominal hysterectomy  03-05-2005    w/  Right Ovarian Cystectomy  . Fracture surgery    . I&d extremity Left 11/28/2015    Procedure: IRRIGATION AND DEBRIDEMENT LEFT KNEE WOUND ;  Surgeon: Rod Can, MD;  Location: [place];  Service: Orthopedics;  Laterality: Left;  . Application of wound vac Left 11/28/2015    Procedure: APPLICATION OF WOUND VAC;  Surgeon: Rod Can, MD;  Location: [place];  Service: Orthopedics;  Laterality: Left;  . Revision of arteriovenous goretex graft Right 02/17/2016    Procedure: REVISION OF ARTERIOVENOUS GORETEX GRAFT;  Surgeon: Elam Dutch, MD;  Location: [place];  Service: Vascular;  Laterality: Right;   Family History  Problem Relation Age of Onset  . Hypertension Father   . Thyroid disease Father   . Hypertension Sister   . Thyroid disease Sister    Social History  Substance Use Topics  . Smoking status: Never Smoker   . Smokeless tobacco: Never Used  . Alcohol Use: No     Comment: quit 2007   OB History    No data available     Review of Systems  Constitutional: Negative for fever and chills.  Respiratory: Negative for shortness of breath.   Cardiovascular: Negative for chest pain.  Skin: Positive for wound.  All other systems reviewed and are negative.  Allergies  Amlodipine and Lisinopril  Home Medications   Prior to Admission medications   Medication Sig Start Date End Date Taking? Authorizing Provider  aspirin 81 MG tablet Take 1 tablet (81 mg total) by mouth 2 (two) times daily after a meal. Reported on 10/02/2015 10/31/15   Rod Can, MD  calcium acetate (PHOSLO) 667 MG capsule Take 667 mg by mouth 3 (three) times daily with meals.    Historical Provider, MD  cephALEXin (KEFLEX) 500 MG capsule Take 1 capsule (500 mg total) by mouth 4 (four) times daily. 03/31/16   Shary Decamp, PA-C  omeprazole  (PRILOSEC) 20 MG capsule Take 20 mg by mouth daily. 12/26/15   Historical Provider, MD   BP 132/78 mmHg  Pulse 82  Temp(Src) 97.8 F (36.6 C) (Oral)  Resp 16  SpO2 100%   Physical Exam  Constitutional: She is oriented to person, place, and time. She appears well-developed and well-nourished.  HENT:  Head: Normocephalic and atraumatic.  Mouth/Throat: Oropharynx is clear and moist.  Eyes: Conjunctivae and EOM are normal. Pupils are equal, round, and reactive to light.  Neck: Normal range of motion.  Cardiovascular: Normal rate, regular rhythm and normal heart sounds.   Pulmonary/Chest: Effort normal and breath sounds normal.  Abdominal: Soft. Bowel sounds  are normal.  Musculoskeletal: Normal range of motion.  12 intact prolene sutures to left knee in a semi-circular fashion; wound clean without drainage or redness  Neurological: She is alert and oriented to person, place, and time.  Skin: Skin is warm and dry.  Psychiatric: She has a normal mood and affect.  Nursing note and vitals reviewed.   ED Course  Procedures   DIAGNOSTIC STUDIES:  Oxygen Saturation is 100% on RA, normal by my interpretation.    COORDINATION OF CARE:  11:03 AM Discussed treatment plan with pt at bedside and pt agreed to plan.  SUTURE REMOVAL Performed by: Quincy Carnes, PA-C Consent: Verbal consent obtained. Patient identity confirmed: provided demographic data Time out: Immediately prior to procedure a "time out" was called to verify the correct patient, procedure, equipment, support staff and site/side marked as required. Location: left knee Wound Appearance: clean Sutures/Staples Removed: 12 Patient tolerance: Patient tolerated the procedure well with no immediate complications.     MDM   Final diagnoses:  Visit for suture removal   Sutures removed without difficulty.  Wound has healed well.  No signs of infection.  Tetanus is UTD.  Will finish antibiotics previously prescribed.  Follow-up  with PCP for any additional concerns.  Discussed plan with patient, he/she acknowledged understanding and agreed with plan of care.  Return precautions given for new or worsening symptoms.  I personally performed the services described in this documentation, which was scribed in my presence. The recorded information has been reviewed and is accurate.  Larene Pickett, PA-C 04/07/16 [place], MD 04/08/16 0800

## 2016-04-07 NOTE — Discharge Instructions (Signed)

## 2016-04-07 NOTE — ED Notes (Signed)
Declined W/C at D/C and was escorted to lobby by RN. 

## 2016-04-21 IMAGING — CT CT NECK W/O CM
4 of 5 series · 16 of 33 positions shown, 18 images · non-contrast
Comparison: None.

CLINICAL DATA: Throat pain, enlarged uvula.

EXAM:
CT NECK WITHOUT CONTRAST
TECHNIQUE: Multidetector CT imaging of the neck was performed following the
standard protocol without intravenous contrast.

[Series 3: neck 2.0 st · axial · 0.33mm/px · z∈[-269,-141]mm · 4 of 108 slices shown, 5 images (1 of 3)]
[im 22/108  soft-tissue]
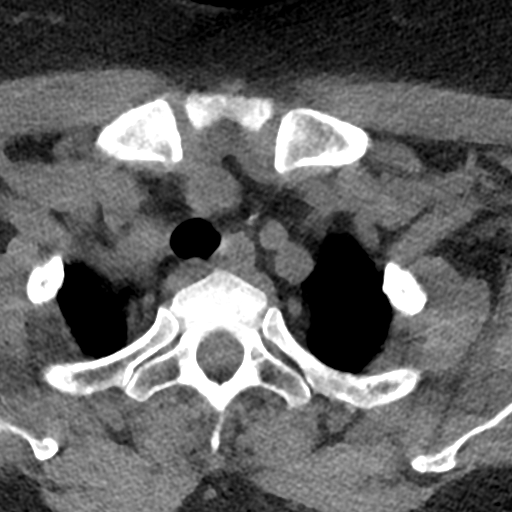
[im 22/108  bone]
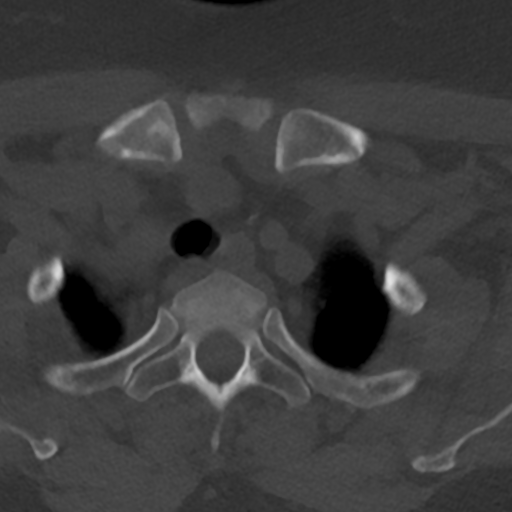
[im 43/108  bone]
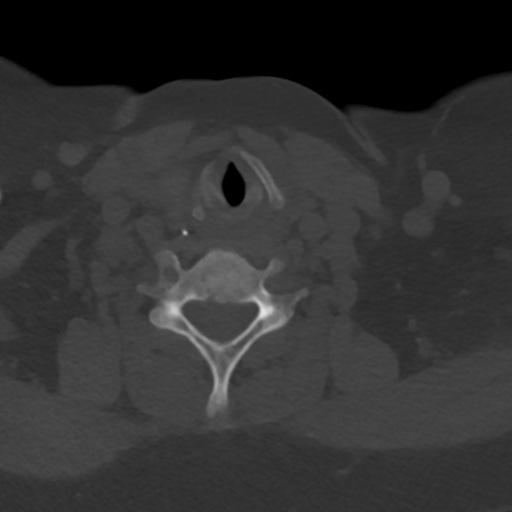
[im 65/108  bone]
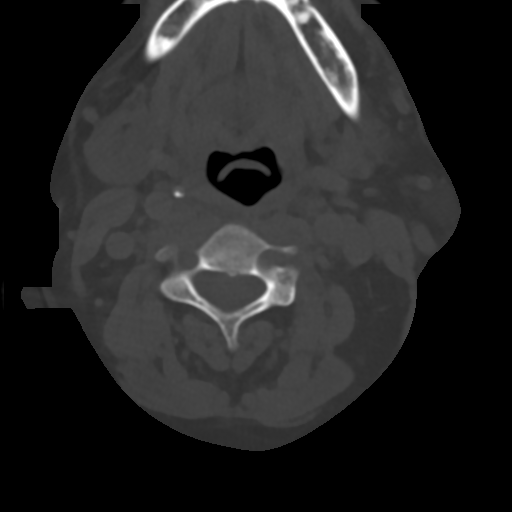
[im 86/108  bone]
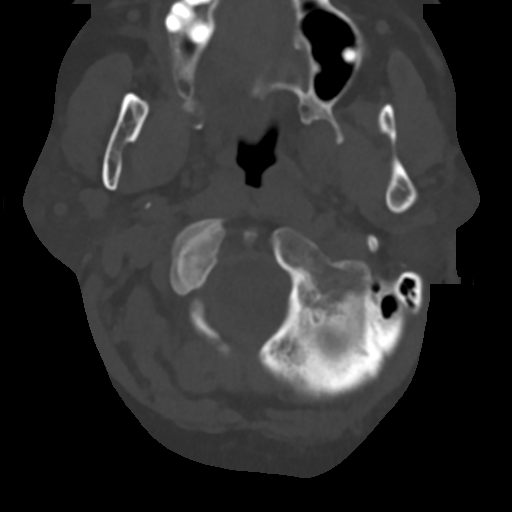

[Series 5: neck 2.0 st · sagittal · 0.41mm/px · 5 of 118 slices shown, 6 images (2 of 3)]
[im 40/118  bone]
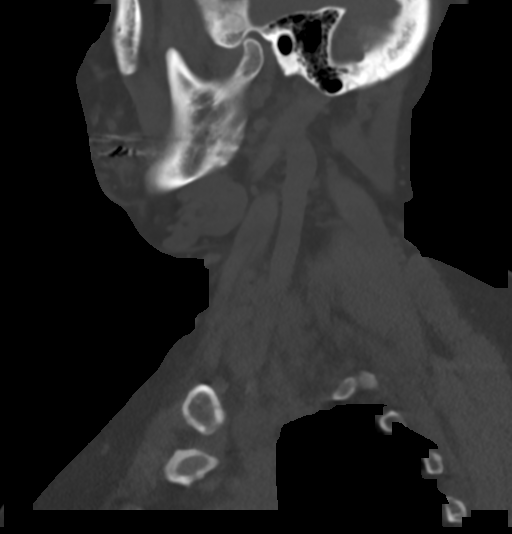
[im 49/118  bone]
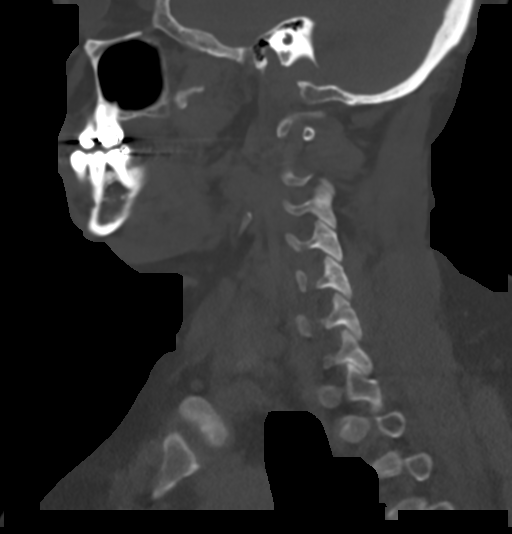
[im 59/118  soft-tissue]
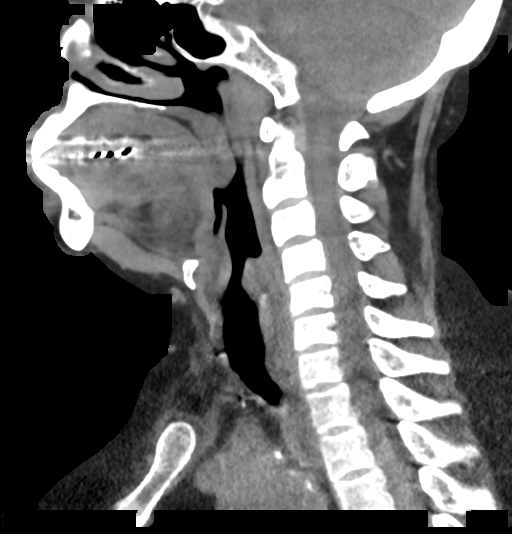
[im 59/118  bone]
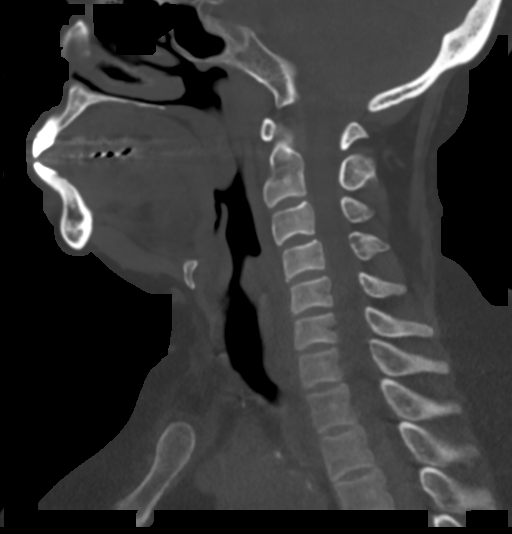
[im 69/118  bone]
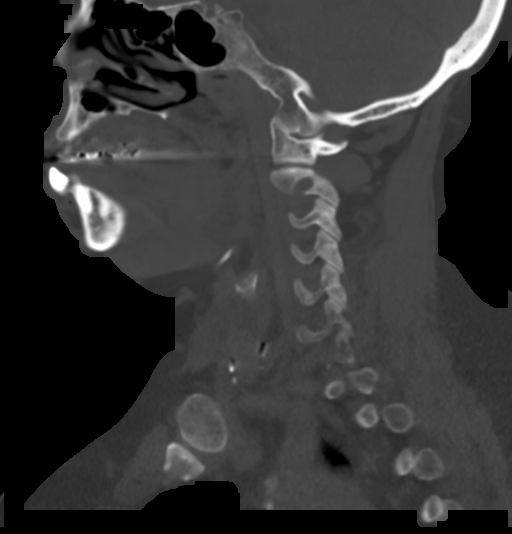
[im 79/118  bone]
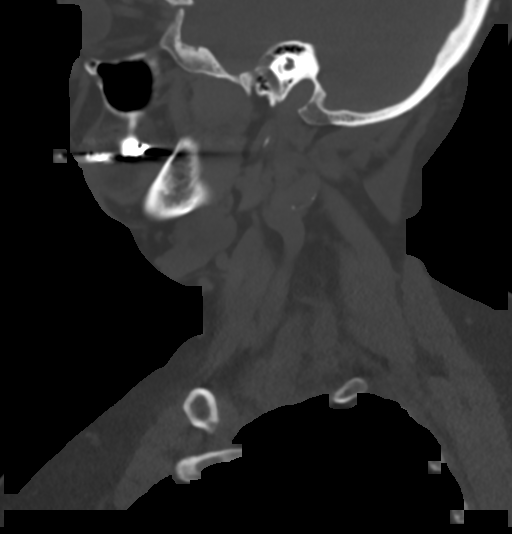

[Series 6: neck 2.0 st · coronal · 0.33mm/px · 3 of 107 slices shown (3 of 3)]
[im 22/107  bone]
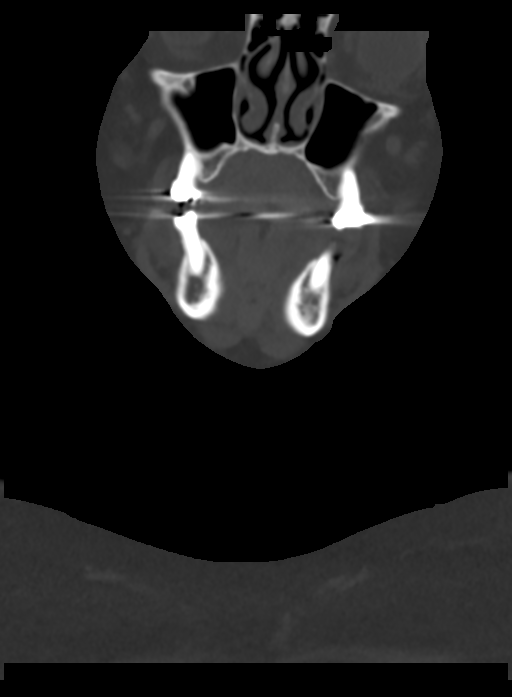
[im 43/107  bone]
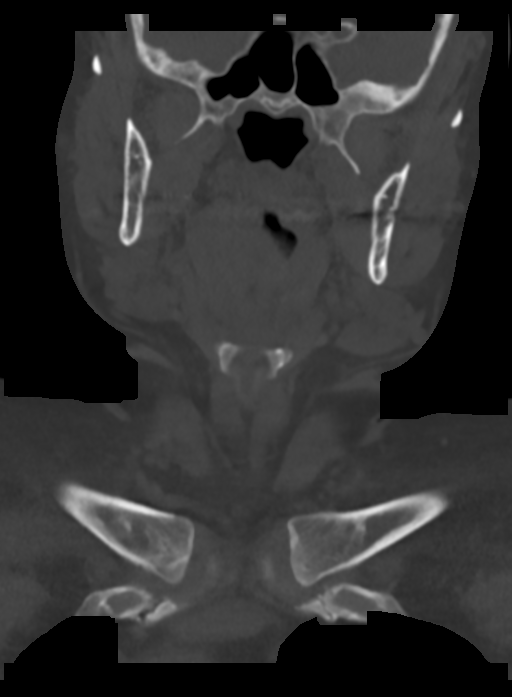
[im 64/107  bone]
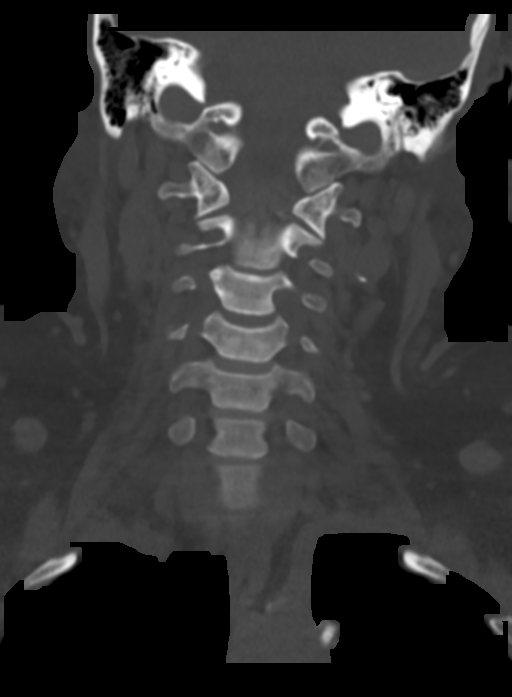

[Series 7: neck 2.0 st orthogonal · axial · 0.39mm/px · z∈[-285,-157]mm · 4 of 109 slices shown]
[im 22/109  bone]
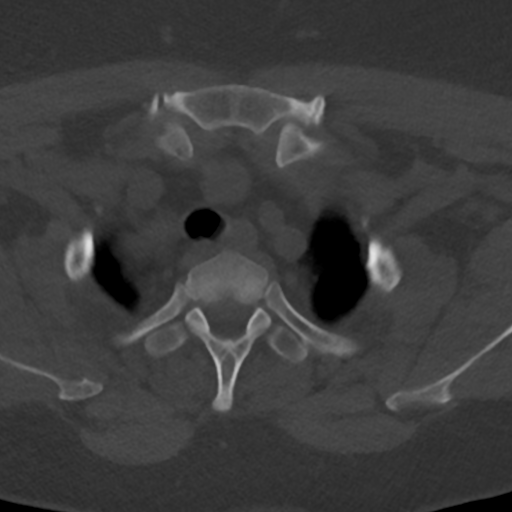
[im 44/109  bone]
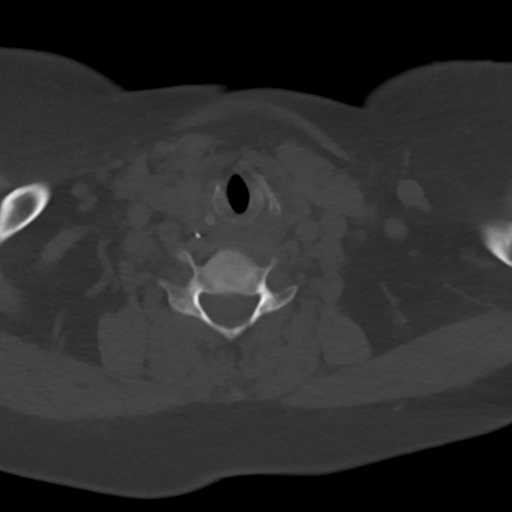
[im 65/109  bone]
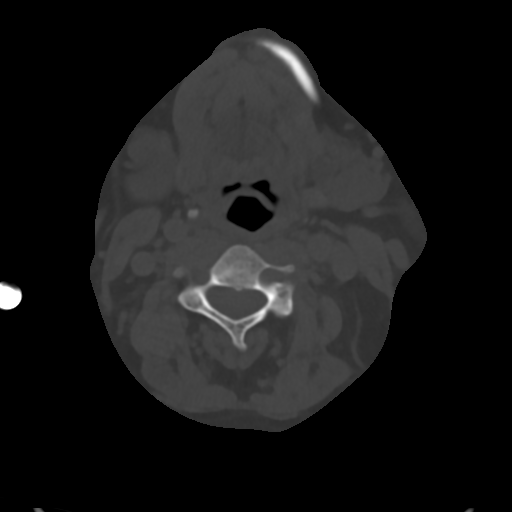
[im 87/109  bone]
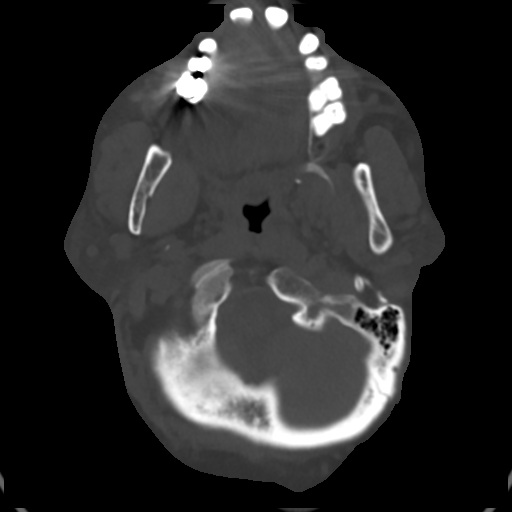

[16 of 33 positions shown; findings below may reference images not displayed]

FINDINGS: The inferior aspect of the soft palate, including the uvula, appears
mildly bulbous, measuring 11 x 11 mm cross-section. See image 36
series 3. Correlate clinically for soft palate inflammation.
Low-attenuation central area could indicate abscess formation. No
other pharyngeal or laryngeal abnormalities.

Unremarkable salivary glands. No pathologic adenopathy within limits
for assessment on noncontrast exam. Atherosclerotic calcification at
the bifurcations. Previous thyroid surgery clips. No lung apex
lesion. Cervical spondylosis. Negative intracranial compartment. No
sinus or mastoid fluid. Unremarkable orbits.
IMPRESSION: Soft palate and uvular enlargement of uncertain significance.
Recommend correlation with physical exam. Soft palate inflammation
related to pharyngitis, or even phlegmon or early abscess formation,
not excluded.

No tonsillar, parapharyngeal, or retropharyngeal masses or fluid
collections of significance.

## 2016-04-29 ENCOUNTER — Ambulatory Visit
Admission: RE | Admit: 2016-04-29 | Discharge: 2016-04-29 | Disposition: A | Discharge status: Home / Self Care | Payer: Medicare Other | Source: Ambulatory Visit | Attending: Nephrology | Admitting: Nephrology

## 2016-04-29 DIAGNOSIS — Z1231 Encounter for screening mammogram for malignant neoplasm of breast: Secondary | ICD-10-CM

## 2016-09-04 IMAGING — CR DG ELBOW COMPLETE 3+V*R*
4 series · 4 of 4 positions shown · non-contrast
Comparison: None.

CLINICAL DATA: Status post fall.  Right elbow pain.

EXAM:
RIGHT ELBOW - COMPLETE 3+ VIEW

[elbow ap]
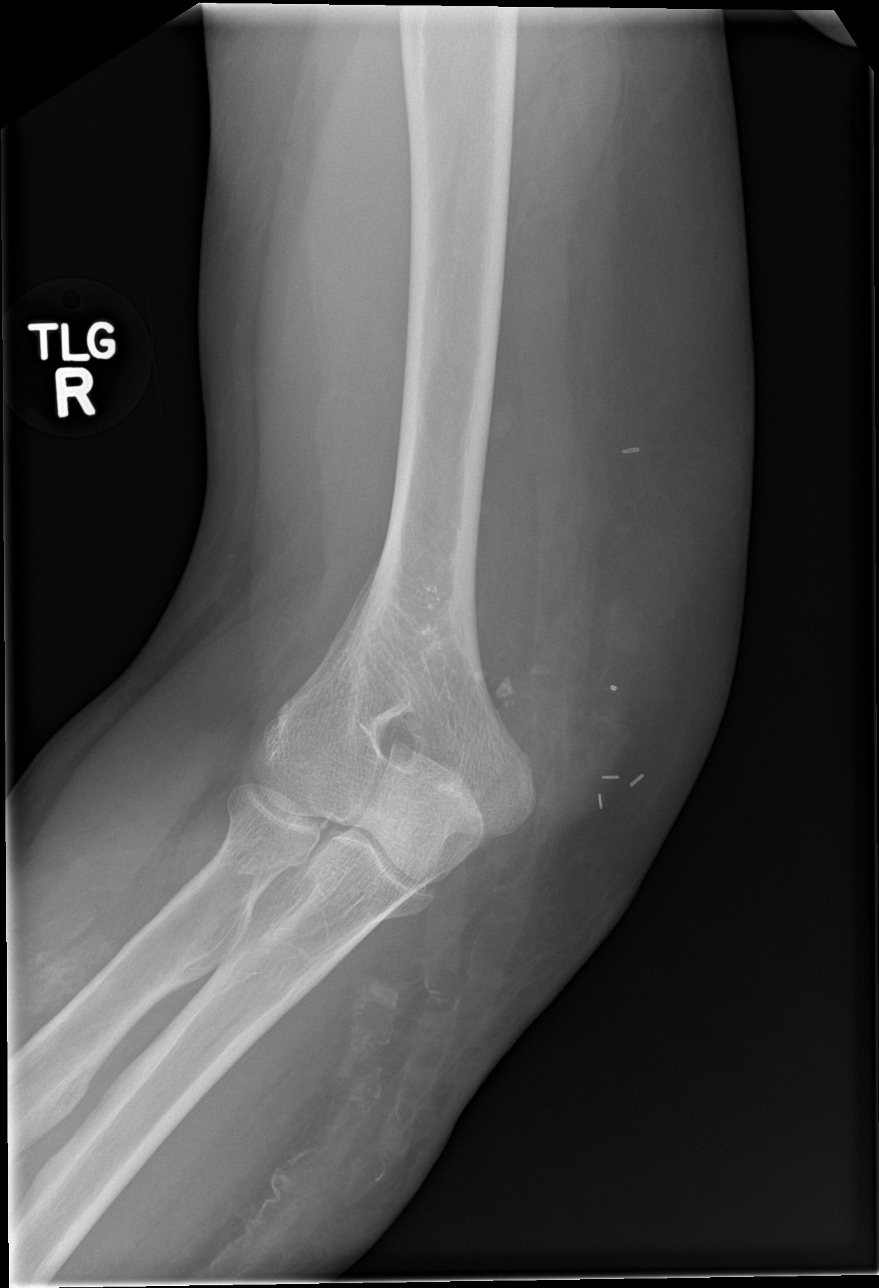

[elbow obl (1 of 2)]
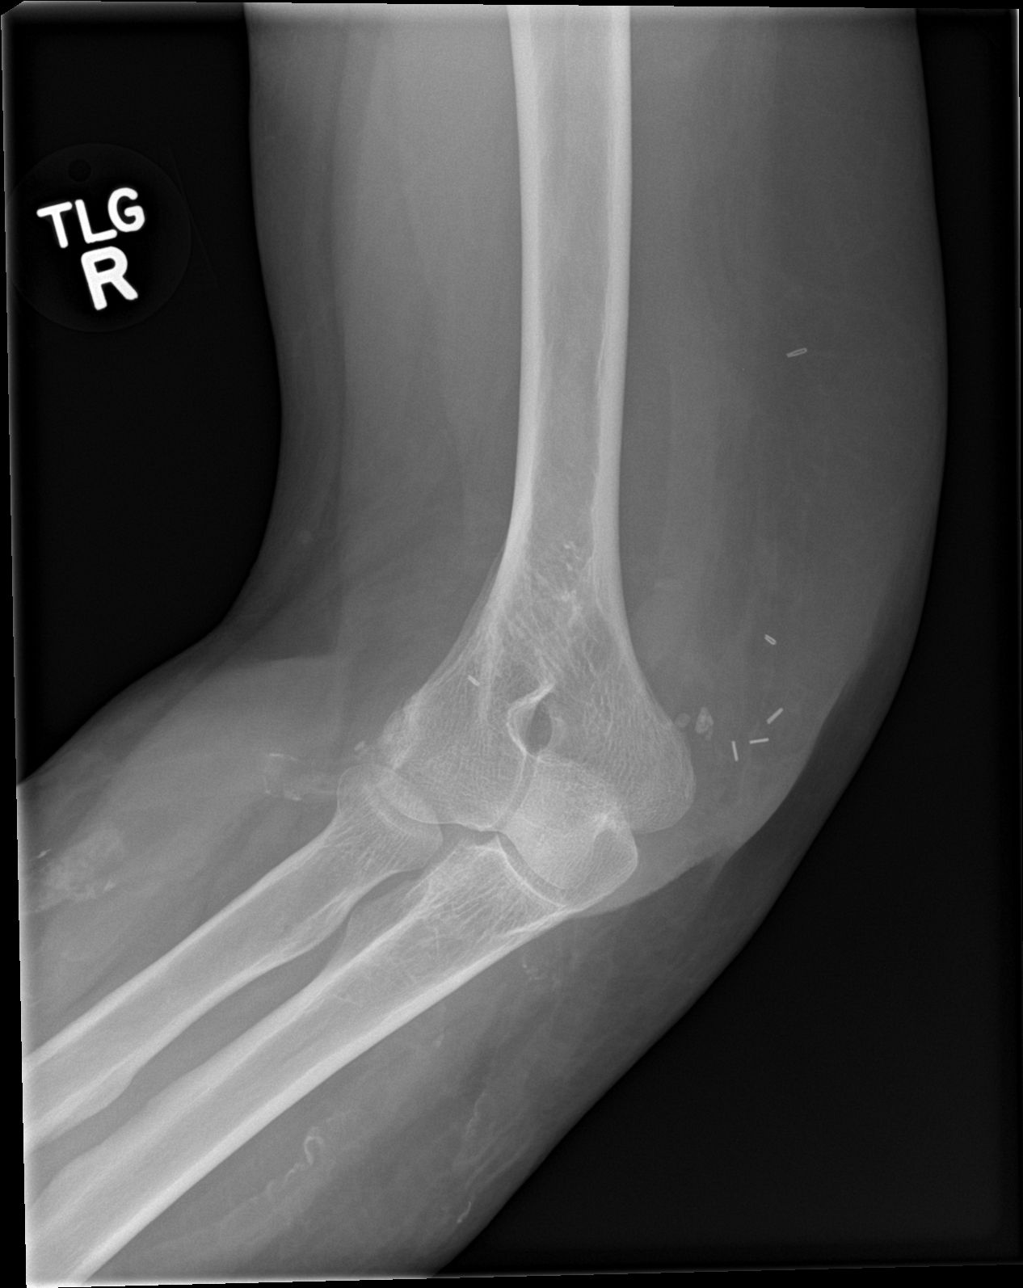

[elbow obl (2 of 2)]
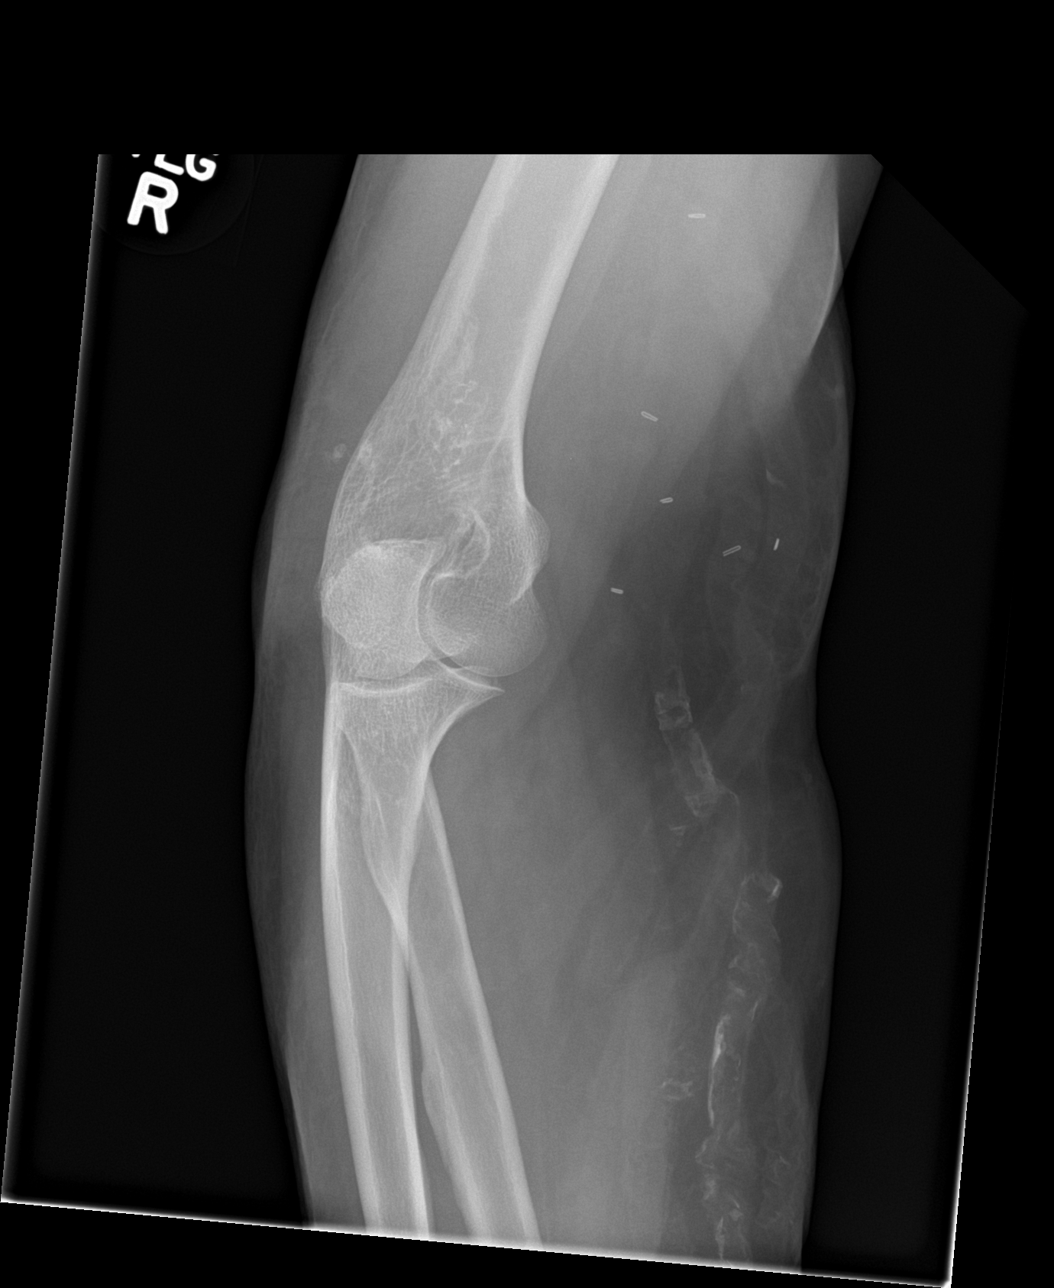

[elbow lat]
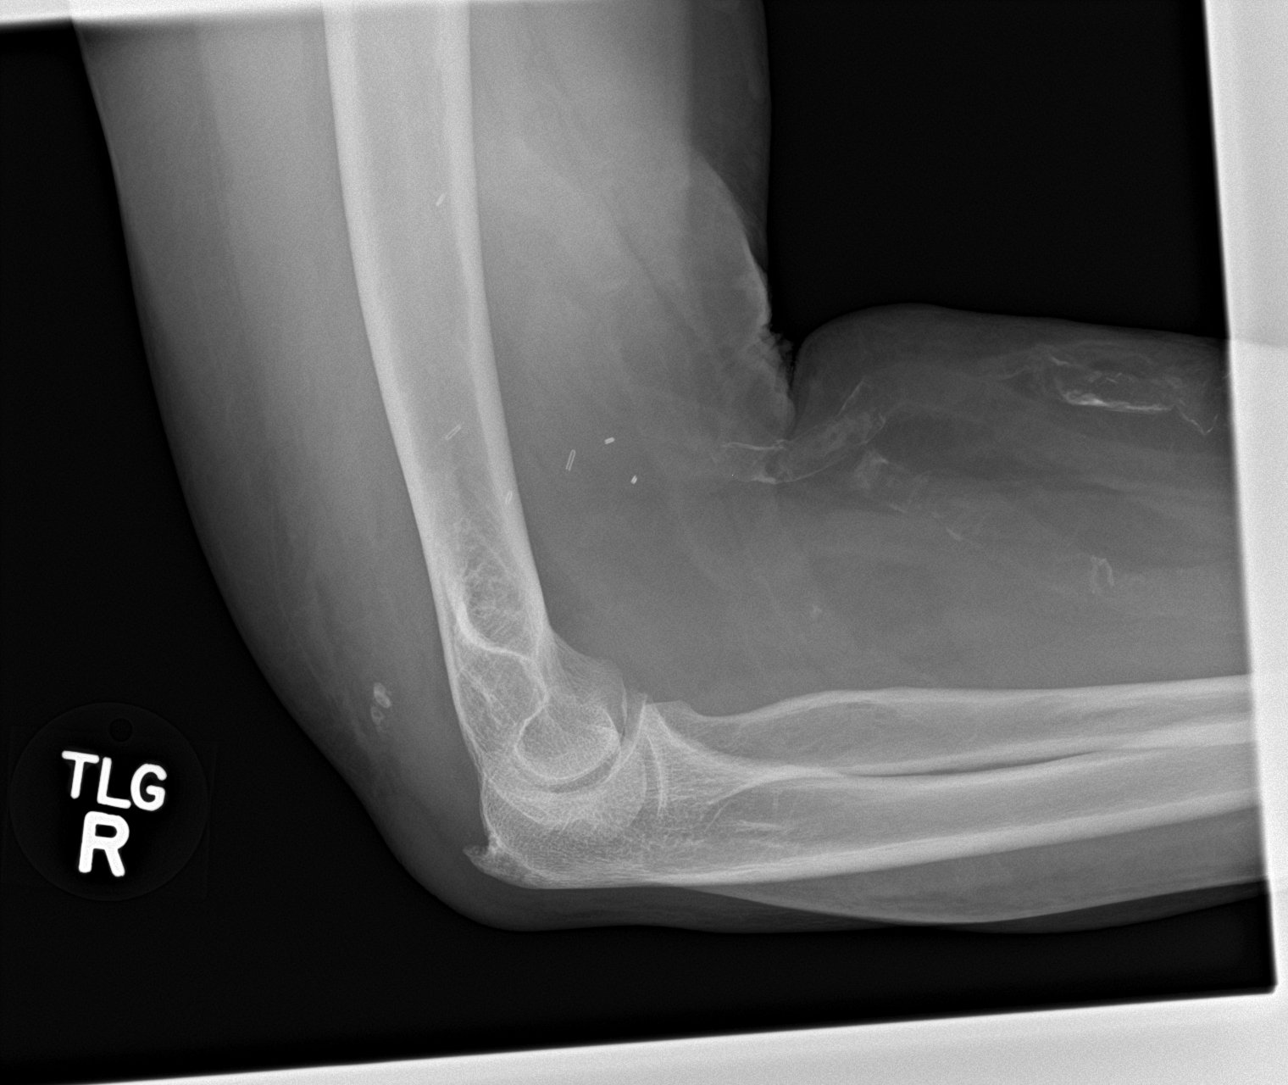

[4 of 4 positions shown; findings below may reference images not displayed]

FINDINGS: There is no evidence of fracture, dislocation, or joint effusion.
There is no evidence of arthropathy or other focal bone abnormality.
Soft tissues are unremarkable. Extensive vascular calcifications in
the region of the dialysis graft.
IMPRESSION: No acute osseous injury of the right elbow.

## 2016-09-22 DIAGNOSIS — D631 Anemia in chronic kidney disease: Secondary | ICD-10-CM | POA: Diagnosis not present

## 2016-09-22 DIAGNOSIS — N2581 Secondary hyperparathyroidism of renal origin: Secondary | ICD-10-CM | POA: Diagnosis not present

## 2016-09-22 DIAGNOSIS — R7989 Other specified abnormal findings of blood chemistry: Secondary | ICD-10-CM | POA: Diagnosis not present

## 2016-09-22 DIAGNOSIS — N186 End stage renal disease: Secondary | ICD-10-CM | POA: Diagnosis not present

## 2016-09-22 DIAGNOSIS — D509 Iron deficiency anemia, unspecified: Secondary | ICD-10-CM | POA: Diagnosis not present

## 2016-09-24 DIAGNOSIS — N186 End stage renal disease: Secondary | ICD-10-CM | POA: Diagnosis not present

## 2016-09-24 DIAGNOSIS — D631 Anemia in chronic kidney disease: Secondary | ICD-10-CM | POA: Diagnosis not present

## 2016-09-24 DIAGNOSIS — N2581 Secondary hyperparathyroidism of renal origin: Secondary | ICD-10-CM | POA: Diagnosis not present

## 2016-09-24 DIAGNOSIS — D509 Iron deficiency anemia, unspecified: Secondary | ICD-10-CM | POA: Diagnosis not present

## 2016-09-24 DIAGNOSIS — R7989 Other specified abnormal findings of blood chemistry: Secondary | ICD-10-CM | POA: Diagnosis not present

## 2016-09-27 DIAGNOSIS — N186 End stage renal disease: Secondary | ICD-10-CM | POA: Diagnosis not present

## 2016-09-27 DIAGNOSIS — D631 Anemia in chronic kidney disease: Secondary | ICD-10-CM | POA: Diagnosis not present

## 2016-09-27 DIAGNOSIS — R7989 Other specified abnormal findings of blood chemistry: Secondary | ICD-10-CM | POA: Diagnosis not present

## 2016-09-27 DIAGNOSIS — N2581 Secondary hyperparathyroidism of renal origin: Secondary | ICD-10-CM | POA: Diagnosis not present

## 2016-09-27 DIAGNOSIS — D509 Iron deficiency anemia, unspecified: Secondary | ICD-10-CM | POA: Diagnosis not present

## 2016-09-29 DIAGNOSIS — R7989 Other specified abnormal findings of blood chemistry: Secondary | ICD-10-CM | POA: Diagnosis not present

## 2016-09-29 DIAGNOSIS — N2581 Secondary hyperparathyroidism of renal origin: Secondary | ICD-10-CM | POA: Diagnosis not present

## 2016-09-29 DIAGNOSIS — N186 End stage renal disease: Secondary | ICD-10-CM | POA: Diagnosis not present

## 2016-09-29 DIAGNOSIS — D509 Iron deficiency anemia, unspecified: Secondary | ICD-10-CM | POA: Diagnosis not present

## 2016-09-29 DIAGNOSIS — D631 Anemia in chronic kidney disease: Secondary | ICD-10-CM | POA: Diagnosis not present

## 2016-10-01 DIAGNOSIS — D509 Iron deficiency anemia, unspecified: Secondary | ICD-10-CM | POA: Diagnosis not present

## 2016-10-01 DIAGNOSIS — D631 Anemia in chronic kidney disease: Secondary | ICD-10-CM | POA: Diagnosis not present

## 2016-10-01 DIAGNOSIS — R7989 Other specified abnormal findings of blood chemistry: Secondary | ICD-10-CM | POA: Diagnosis not present

## 2016-10-01 DIAGNOSIS — N186 End stage renal disease: Secondary | ICD-10-CM | POA: Diagnosis not present

## 2016-10-01 DIAGNOSIS — N2581 Secondary hyperparathyroidism of renal origin: Secondary | ICD-10-CM | POA: Diagnosis not present

## 2016-10-04 DIAGNOSIS — R7989 Other specified abnormal findings of blood chemistry: Secondary | ICD-10-CM | POA: Diagnosis not present

## 2016-10-04 DIAGNOSIS — D631 Anemia in chronic kidney disease: Secondary | ICD-10-CM | POA: Diagnosis not present

## 2016-10-04 DIAGNOSIS — D509 Iron deficiency anemia, unspecified: Secondary | ICD-10-CM | POA: Diagnosis not present

## 2016-10-04 DIAGNOSIS — N186 End stage renal disease: Secondary | ICD-10-CM | POA: Diagnosis not present

## 2016-10-04 DIAGNOSIS — N2581 Secondary hyperparathyroidism of renal origin: Secondary | ICD-10-CM | POA: Diagnosis not present

## 2016-10-06 DIAGNOSIS — D631 Anemia in chronic kidney disease: Secondary | ICD-10-CM | POA: Diagnosis not present

## 2016-10-06 DIAGNOSIS — N186 End stage renal disease: Secondary | ICD-10-CM | POA: Diagnosis not present

## 2016-10-06 DIAGNOSIS — N2581 Secondary hyperparathyroidism of renal origin: Secondary | ICD-10-CM | POA: Diagnosis not present

## 2016-10-06 DIAGNOSIS — R7989 Other specified abnormal findings of blood chemistry: Secondary | ICD-10-CM | POA: Diagnosis not present

## 2016-10-06 DIAGNOSIS — D509 Iron deficiency anemia, unspecified: Secondary | ICD-10-CM | POA: Diagnosis not present

## 2016-10-08 DIAGNOSIS — R7989 Other specified abnormal findings of blood chemistry: Secondary | ICD-10-CM | POA: Diagnosis not present

## 2016-10-08 DIAGNOSIS — D631 Anemia in chronic kidney disease: Secondary | ICD-10-CM | POA: Diagnosis not present

## 2016-10-08 DIAGNOSIS — D509 Iron deficiency anemia, unspecified: Secondary | ICD-10-CM | POA: Diagnosis not present

## 2016-10-08 DIAGNOSIS — N186 End stage renal disease: Secondary | ICD-10-CM | POA: Diagnosis not present

## 2016-10-08 DIAGNOSIS — N2581 Secondary hyperparathyroidism of renal origin: Secondary | ICD-10-CM | POA: Diagnosis not present

## 2016-10-11 DIAGNOSIS — D631 Anemia in chronic kidney disease: Secondary | ICD-10-CM | POA: Diagnosis not present

## 2016-10-11 DIAGNOSIS — R7989 Other specified abnormal findings of blood chemistry: Secondary | ICD-10-CM | POA: Diagnosis not present

## 2016-10-11 DIAGNOSIS — N186 End stage renal disease: Secondary | ICD-10-CM | POA: Diagnosis not present

## 2016-10-11 DIAGNOSIS — D509 Iron deficiency anemia, unspecified: Secondary | ICD-10-CM | POA: Diagnosis not present

## 2016-10-11 DIAGNOSIS — N2581 Secondary hyperparathyroidism of renal origin: Secondary | ICD-10-CM | POA: Diagnosis not present

## 2016-10-13 DIAGNOSIS — N186 End stage renal disease: Secondary | ICD-10-CM | POA: Diagnosis not present

## 2016-10-13 DIAGNOSIS — D631 Anemia in chronic kidney disease: Secondary | ICD-10-CM | POA: Diagnosis not present

## 2016-10-13 DIAGNOSIS — R7989 Other specified abnormal findings of blood chemistry: Secondary | ICD-10-CM | POA: Diagnosis not present

## 2016-10-13 DIAGNOSIS — N2581 Secondary hyperparathyroidism of renal origin: Secondary | ICD-10-CM | POA: Diagnosis not present

## 2016-10-13 DIAGNOSIS — D509 Iron deficiency anemia, unspecified: Secondary | ICD-10-CM | POA: Diagnosis not present

## 2016-10-15 DIAGNOSIS — R7989 Other specified abnormal findings of blood chemistry: Secondary | ICD-10-CM | POA: Diagnosis not present

## 2016-10-15 DIAGNOSIS — D509 Iron deficiency anemia, unspecified: Secondary | ICD-10-CM | POA: Diagnosis not present

## 2016-10-15 DIAGNOSIS — N2581 Secondary hyperparathyroidism of renal origin: Secondary | ICD-10-CM | POA: Diagnosis not present

## 2016-10-15 DIAGNOSIS — D631 Anemia in chronic kidney disease: Secondary | ICD-10-CM | POA: Diagnosis not present

## 2016-10-15 DIAGNOSIS — N186 End stage renal disease: Secondary | ICD-10-CM | POA: Diagnosis not present

## 2016-10-18 DIAGNOSIS — D631 Anemia in chronic kidney disease: Secondary | ICD-10-CM | POA: Diagnosis not present

## 2016-10-18 DIAGNOSIS — N186 End stage renal disease: Secondary | ICD-10-CM | POA: Diagnosis not present

## 2016-10-18 DIAGNOSIS — N2581 Secondary hyperparathyroidism of renal origin: Secondary | ICD-10-CM | POA: Diagnosis not present

## 2016-10-18 DIAGNOSIS — R7989 Other specified abnormal findings of blood chemistry: Secondary | ICD-10-CM | POA: Diagnosis not present

## 2016-10-18 DIAGNOSIS — D509 Iron deficiency anemia, unspecified: Secondary | ICD-10-CM | POA: Diagnosis not present

## 2016-10-20 DIAGNOSIS — N186 End stage renal disease: Secondary | ICD-10-CM | POA: Diagnosis not present

## 2016-10-20 DIAGNOSIS — I120 Hypertensive chronic kidney disease with stage 5 chronic kidney disease or end stage renal disease: Secondary | ICD-10-CM | POA: Diagnosis not present

## 2016-10-20 DIAGNOSIS — D631 Anemia in chronic kidney disease: Secondary | ICD-10-CM | POA: Diagnosis not present

## 2016-10-20 DIAGNOSIS — N2581 Secondary hyperparathyroidism of renal origin: Secondary | ICD-10-CM | POA: Diagnosis not present

## 2016-10-20 DIAGNOSIS — D509 Iron deficiency anemia, unspecified: Secondary | ICD-10-CM | POA: Diagnosis not present

## 2016-10-20 DIAGNOSIS — R7989 Other specified abnormal findings of blood chemistry: Secondary | ICD-10-CM | POA: Diagnosis not present

## 2016-10-20 DIAGNOSIS — Z992 Dependence on renal dialysis: Secondary | ICD-10-CM | POA: Diagnosis not present

## 2016-10-22 DIAGNOSIS — N2581 Secondary hyperparathyroidism of renal origin: Secondary | ICD-10-CM | POA: Diagnosis not present

## 2016-10-22 DIAGNOSIS — D509 Iron deficiency anemia, unspecified: Secondary | ICD-10-CM | POA: Diagnosis not present

## 2016-10-22 DIAGNOSIS — N186 End stage renal disease: Secondary | ICD-10-CM | POA: Diagnosis not present

## 2016-10-22 DIAGNOSIS — D631 Anemia in chronic kidney disease: Secondary | ICD-10-CM | POA: Diagnosis not present

## 2016-10-25 DIAGNOSIS — D509 Iron deficiency anemia, unspecified: Secondary | ICD-10-CM | POA: Diagnosis not present

## 2016-10-25 DIAGNOSIS — N2581 Secondary hyperparathyroidism of renal origin: Secondary | ICD-10-CM | POA: Diagnosis not present

## 2016-10-25 DIAGNOSIS — D631 Anemia in chronic kidney disease: Secondary | ICD-10-CM | POA: Diagnosis not present

## 2016-10-25 DIAGNOSIS — N186 End stage renal disease: Secondary | ICD-10-CM | POA: Diagnosis not present

## 2016-10-27 DIAGNOSIS — D631 Anemia in chronic kidney disease: Secondary | ICD-10-CM | POA: Diagnosis not present

## 2016-10-27 DIAGNOSIS — N186 End stage renal disease: Secondary | ICD-10-CM | POA: Diagnosis not present

## 2016-10-27 DIAGNOSIS — D509 Iron deficiency anemia, unspecified: Secondary | ICD-10-CM | POA: Diagnosis not present

## 2016-10-27 DIAGNOSIS — N2581 Secondary hyperparathyroidism of renal origin: Secondary | ICD-10-CM | POA: Diagnosis not present

## 2016-10-29 DIAGNOSIS — N186 End stage renal disease: Secondary | ICD-10-CM | POA: Diagnosis not present

## 2016-10-29 DIAGNOSIS — N2581 Secondary hyperparathyroidism of renal origin: Secondary | ICD-10-CM | POA: Diagnosis not present

## 2016-10-29 DIAGNOSIS — D631 Anemia in chronic kidney disease: Secondary | ICD-10-CM | POA: Diagnosis not present

## 2016-10-29 DIAGNOSIS — D509 Iron deficiency anemia, unspecified: Secondary | ICD-10-CM | POA: Diagnosis not present

## 2016-11-01 DIAGNOSIS — D631 Anemia in chronic kidney disease: Secondary | ICD-10-CM | POA: Diagnosis not present

## 2016-11-01 DIAGNOSIS — N186 End stage renal disease: Secondary | ICD-10-CM | POA: Diagnosis not present

## 2016-11-01 DIAGNOSIS — N2581 Secondary hyperparathyroidism of renal origin: Secondary | ICD-10-CM | POA: Diagnosis not present

## 2016-11-01 DIAGNOSIS — D509 Iron deficiency anemia, unspecified: Secondary | ICD-10-CM | POA: Diagnosis not present

## 2016-11-03 DIAGNOSIS — N186 End stage renal disease: Secondary | ICD-10-CM | POA: Diagnosis not present

## 2016-11-03 DIAGNOSIS — D631 Anemia in chronic kidney disease: Secondary | ICD-10-CM | POA: Diagnosis not present

## 2016-11-03 DIAGNOSIS — D509 Iron deficiency anemia, unspecified: Secondary | ICD-10-CM | POA: Diagnosis not present

## 2016-11-03 DIAGNOSIS — N2581 Secondary hyperparathyroidism of renal origin: Secondary | ICD-10-CM | POA: Diagnosis not present

## 2016-11-05 DIAGNOSIS — N186 End stage renal disease: Secondary | ICD-10-CM | POA: Diagnosis not present

## 2016-11-05 DIAGNOSIS — D631 Anemia in chronic kidney disease: Secondary | ICD-10-CM | POA: Diagnosis not present

## 2016-11-05 DIAGNOSIS — N2581 Secondary hyperparathyroidism of renal origin: Secondary | ICD-10-CM | POA: Diagnosis not present

## 2016-11-05 DIAGNOSIS — D509 Iron deficiency anemia, unspecified: Secondary | ICD-10-CM | POA: Diagnosis not present

## 2016-11-08 DIAGNOSIS — D509 Iron deficiency anemia, unspecified: Secondary | ICD-10-CM | POA: Diagnosis not present

## 2016-11-08 DIAGNOSIS — D631 Anemia in chronic kidney disease: Secondary | ICD-10-CM | POA: Diagnosis not present

## 2016-11-08 DIAGNOSIS — N186 End stage renal disease: Secondary | ICD-10-CM | POA: Diagnosis not present

## 2016-11-08 DIAGNOSIS — N2581 Secondary hyperparathyroidism of renal origin: Secondary | ICD-10-CM | POA: Diagnosis not present

## 2016-11-09 DIAGNOSIS — N182 Chronic kidney disease, stage 2 (mild): Secondary | ICD-10-CM | POA: Diagnosis not present

## 2016-11-09 DIAGNOSIS — I871 Compression of vein: Secondary | ICD-10-CM | POA: Diagnosis not present

## 2016-11-09 DIAGNOSIS — Z992 Dependence on renal dialysis: Secondary | ICD-10-CM | POA: Diagnosis not present

## 2016-11-09 DIAGNOSIS — T82858A Stenosis of vascular prosthetic devices, implants and grafts, initial encounter: Secondary | ICD-10-CM | POA: Diagnosis not present

## 2016-11-10 DIAGNOSIS — N2581 Secondary hyperparathyroidism of renal origin: Secondary | ICD-10-CM | POA: Diagnosis not present

## 2016-11-10 DIAGNOSIS — N186 End stage renal disease: Secondary | ICD-10-CM | POA: Diagnosis not present

## 2016-11-10 DIAGNOSIS — D509 Iron deficiency anemia, unspecified: Secondary | ICD-10-CM | POA: Diagnosis not present

## 2016-11-10 DIAGNOSIS — D631 Anemia in chronic kidney disease: Secondary | ICD-10-CM | POA: Diagnosis not present

## 2016-11-12 DIAGNOSIS — D509 Iron deficiency anemia, unspecified: Secondary | ICD-10-CM | POA: Diagnosis not present

## 2016-11-12 DIAGNOSIS — D631 Anemia in chronic kidney disease: Secondary | ICD-10-CM | POA: Diagnosis not present

## 2016-11-12 DIAGNOSIS — N186 End stage renal disease: Secondary | ICD-10-CM | POA: Diagnosis not present

## 2016-11-12 DIAGNOSIS — N2581 Secondary hyperparathyroidism of renal origin: Secondary | ICD-10-CM | POA: Diagnosis not present

## 2016-11-15 DIAGNOSIS — N186 End stage renal disease: Secondary | ICD-10-CM | POA: Diagnosis not present

## 2016-11-15 DIAGNOSIS — D631 Anemia in chronic kidney disease: Secondary | ICD-10-CM | POA: Diagnosis not present

## 2016-11-15 DIAGNOSIS — D509 Iron deficiency anemia, unspecified: Secondary | ICD-10-CM | POA: Diagnosis not present

## 2016-11-15 DIAGNOSIS — N2581 Secondary hyperparathyroidism of renal origin: Secondary | ICD-10-CM | POA: Diagnosis not present

## 2016-11-16 DIAGNOSIS — T82858D Stenosis of vascular prosthetic devices, implants and grafts, subsequent encounter: Secondary | ICD-10-CM | POA: Diagnosis not present

## 2016-11-16 DIAGNOSIS — Z992 Dependence on renal dialysis: Secondary | ICD-10-CM | POA: Diagnosis not present

## 2016-11-16 DIAGNOSIS — N182 Chronic kidney disease, stage 2 (mild): Secondary | ICD-10-CM | POA: Diagnosis not present

## 2016-11-17 DIAGNOSIS — D509 Iron deficiency anemia, unspecified: Secondary | ICD-10-CM | POA: Diagnosis not present

## 2016-11-17 DIAGNOSIS — N2581 Secondary hyperparathyroidism of renal origin: Secondary | ICD-10-CM | POA: Diagnosis not present

## 2016-11-17 DIAGNOSIS — Z992 Dependence on renal dialysis: Secondary | ICD-10-CM | POA: Diagnosis not present

## 2016-11-17 DIAGNOSIS — N186 End stage renal disease: Secondary | ICD-10-CM | POA: Diagnosis not present

## 2016-11-17 DIAGNOSIS — D631 Anemia in chronic kidney disease: Secondary | ICD-10-CM | POA: Diagnosis not present

## 2016-11-17 DIAGNOSIS — I120 Hypertensive chronic kidney disease with stage 5 chronic kidney disease or end stage renal disease: Secondary | ICD-10-CM | POA: Diagnosis not present

## 2016-11-19 DIAGNOSIS — N2581 Secondary hyperparathyroidism of renal origin: Secondary | ICD-10-CM | POA: Diagnosis not present

## 2016-11-19 DIAGNOSIS — D631 Anemia in chronic kidney disease: Secondary | ICD-10-CM | POA: Diagnosis not present

## 2016-11-19 DIAGNOSIS — N186 End stage renal disease: Secondary | ICD-10-CM | POA: Diagnosis not present

## 2016-11-19 DIAGNOSIS — D509 Iron deficiency anemia, unspecified: Secondary | ICD-10-CM | POA: Diagnosis not present

## 2016-11-22 DIAGNOSIS — N186 End stage renal disease: Secondary | ICD-10-CM | POA: Diagnosis not present

## 2016-11-22 DIAGNOSIS — D509 Iron deficiency anemia, unspecified: Secondary | ICD-10-CM | POA: Diagnosis not present

## 2016-11-22 DIAGNOSIS — D631 Anemia in chronic kidney disease: Secondary | ICD-10-CM | POA: Diagnosis not present

## 2016-11-22 DIAGNOSIS — N2581 Secondary hyperparathyroidism of renal origin: Secondary | ICD-10-CM | POA: Diagnosis not present

## 2016-11-24 DIAGNOSIS — N2581 Secondary hyperparathyroidism of renal origin: Secondary | ICD-10-CM | POA: Diagnosis not present

## 2016-11-24 DIAGNOSIS — N186 End stage renal disease: Secondary | ICD-10-CM | POA: Diagnosis not present

## 2016-11-24 DIAGNOSIS — D509 Iron deficiency anemia, unspecified: Secondary | ICD-10-CM | POA: Diagnosis not present

## 2016-11-24 DIAGNOSIS — D631 Anemia in chronic kidney disease: Secondary | ICD-10-CM | POA: Diagnosis not present

## 2016-11-26 DIAGNOSIS — D509 Iron deficiency anemia, unspecified: Secondary | ICD-10-CM | POA: Diagnosis not present

## 2016-11-26 DIAGNOSIS — D631 Anemia in chronic kidney disease: Secondary | ICD-10-CM | POA: Diagnosis not present

## 2016-11-26 DIAGNOSIS — N2581 Secondary hyperparathyroidism of renal origin: Secondary | ICD-10-CM | POA: Diagnosis not present

## 2016-11-26 DIAGNOSIS — N186 End stage renal disease: Secondary | ICD-10-CM | POA: Diagnosis not present

## 2016-11-29 DIAGNOSIS — D509 Iron deficiency anemia, unspecified: Secondary | ICD-10-CM | POA: Diagnosis not present

## 2016-11-29 DIAGNOSIS — N186 End stage renal disease: Secondary | ICD-10-CM | POA: Diagnosis not present

## 2016-11-29 DIAGNOSIS — D631 Anemia in chronic kidney disease: Secondary | ICD-10-CM | POA: Diagnosis not present

## 2016-11-29 DIAGNOSIS — N2581 Secondary hyperparathyroidism of renal origin: Secondary | ICD-10-CM | POA: Diagnosis not present

## 2016-12-01 DIAGNOSIS — N186 End stage renal disease: Secondary | ICD-10-CM | POA: Diagnosis not present

## 2016-12-01 DIAGNOSIS — D509 Iron deficiency anemia, unspecified: Secondary | ICD-10-CM | POA: Diagnosis not present

## 2016-12-01 DIAGNOSIS — N2581 Secondary hyperparathyroidism of renal origin: Secondary | ICD-10-CM | POA: Diagnosis not present

## 2016-12-01 DIAGNOSIS — D631 Anemia in chronic kidney disease: Secondary | ICD-10-CM | POA: Diagnosis not present

## 2016-12-03 DIAGNOSIS — D509 Iron deficiency anemia, unspecified: Secondary | ICD-10-CM | POA: Diagnosis not present

## 2016-12-03 DIAGNOSIS — N2581 Secondary hyperparathyroidism of renal origin: Secondary | ICD-10-CM | POA: Diagnosis not present

## 2016-12-03 DIAGNOSIS — N186 End stage renal disease: Secondary | ICD-10-CM | POA: Diagnosis not present

## 2016-12-03 DIAGNOSIS — D631 Anemia in chronic kidney disease: Secondary | ICD-10-CM | POA: Diagnosis not present

## 2016-12-06 DIAGNOSIS — D631 Anemia in chronic kidney disease: Secondary | ICD-10-CM | POA: Diagnosis not present

## 2016-12-06 DIAGNOSIS — N186 End stage renal disease: Secondary | ICD-10-CM | POA: Diagnosis not present

## 2016-12-06 DIAGNOSIS — N2581 Secondary hyperparathyroidism of renal origin: Secondary | ICD-10-CM | POA: Diagnosis not present

## 2016-12-06 DIAGNOSIS — D509 Iron deficiency anemia, unspecified: Secondary | ICD-10-CM | POA: Diagnosis not present

## 2016-12-08 DIAGNOSIS — D631 Anemia in chronic kidney disease: Secondary | ICD-10-CM | POA: Diagnosis not present

## 2016-12-08 DIAGNOSIS — N2581 Secondary hyperparathyroidism of renal origin: Secondary | ICD-10-CM | POA: Diagnosis not present

## 2016-12-08 DIAGNOSIS — D509 Iron deficiency anemia, unspecified: Secondary | ICD-10-CM | POA: Diagnosis not present

## 2016-12-08 DIAGNOSIS — N186 End stage renal disease: Secondary | ICD-10-CM | POA: Diagnosis not present

## 2016-12-10 DIAGNOSIS — D631 Anemia in chronic kidney disease: Secondary | ICD-10-CM | POA: Diagnosis not present

## 2016-12-10 DIAGNOSIS — N186 End stage renal disease: Secondary | ICD-10-CM | POA: Diagnosis not present

## 2016-12-10 DIAGNOSIS — D509 Iron deficiency anemia, unspecified: Secondary | ICD-10-CM | POA: Diagnosis not present

## 2016-12-10 DIAGNOSIS — N2581 Secondary hyperparathyroidism of renal origin: Secondary | ICD-10-CM | POA: Diagnosis not present

## 2016-12-13 DIAGNOSIS — N2581 Secondary hyperparathyroidism of renal origin: Secondary | ICD-10-CM | POA: Diagnosis not present

## 2016-12-13 DIAGNOSIS — D509 Iron deficiency anemia, unspecified: Secondary | ICD-10-CM | POA: Diagnosis not present

## 2016-12-13 DIAGNOSIS — D631 Anemia in chronic kidney disease: Secondary | ICD-10-CM | POA: Diagnosis not present

## 2016-12-13 DIAGNOSIS — N186 End stage renal disease: Secondary | ICD-10-CM | POA: Diagnosis not present

## 2016-12-15 DIAGNOSIS — N186 End stage renal disease: Secondary | ICD-10-CM | POA: Diagnosis not present

## 2016-12-15 DIAGNOSIS — D631 Anemia in chronic kidney disease: Secondary | ICD-10-CM | POA: Diagnosis not present

## 2016-12-15 DIAGNOSIS — N2581 Secondary hyperparathyroidism of renal origin: Secondary | ICD-10-CM | POA: Diagnosis not present

## 2016-12-15 DIAGNOSIS — D509 Iron deficiency anemia, unspecified: Secondary | ICD-10-CM | POA: Diagnosis not present

## 2016-12-17 DIAGNOSIS — D509 Iron deficiency anemia, unspecified: Secondary | ICD-10-CM | POA: Diagnosis not present

## 2016-12-17 DIAGNOSIS — N186 End stage renal disease: Secondary | ICD-10-CM | POA: Diagnosis not present

## 2016-12-17 DIAGNOSIS — D631 Anemia in chronic kidney disease: Secondary | ICD-10-CM | POA: Diagnosis not present

## 2016-12-17 DIAGNOSIS — N2581 Secondary hyperparathyroidism of renal origin: Secondary | ICD-10-CM | POA: Diagnosis not present

## 2016-12-18 DIAGNOSIS — I120 Hypertensive chronic kidney disease with stage 5 chronic kidney disease or end stage renal disease: Secondary | ICD-10-CM | POA: Diagnosis not present

## 2016-12-18 DIAGNOSIS — Z992 Dependence on renal dialysis: Secondary | ICD-10-CM | POA: Diagnosis not present

## 2016-12-18 DIAGNOSIS — N186 End stage renal disease: Secondary | ICD-10-CM | POA: Diagnosis not present

## 2016-12-20 DIAGNOSIS — D509 Iron deficiency anemia, unspecified: Secondary | ICD-10-CM | POA: Diagnosis not present

## 2016-12-20 DIAGNOSIS — N186 End stage renal disease: Secondary | ICD-10-CM | POA: Diagnosis not present

## 2016-12-20 DIAGNOSIS — N2581 Secondary hyperparathyroidism of renal origin: Secondary | ICD-10-CM | POA: Diagnosis not present

## 2016-12-20 DIAGNOSIS — D631 Anemia in chronic kidney disease: Secondary | ICD-10-CM | POA: Diagnosis not present

## 2016-12-22 DIAGNOSIS — D509 Iron deficiency anemia, unspecified: Secondary | ICD-10-CM | POA: Diagnosis not present

## 2016-12-22 DIAGNOSIS — N186 End stage renal disease: Secondary | ICD-10-CM | POA: Diagnosis not present

## 2016-12-22 DIAGNOSIS — D631 Anemia in chronic kidney disease: Secondary | ICD-10-CM | POA: Diagnosis not present

## 2016-12-22 DIAGNOSIS — N2581 Secondary hyperparathyroidism of renal origin: Secondary | ICD-10-CM | POA: Diagnosis not present

## 2016-12-24 DIAGNOSIS — N186 End stage renal disease: Secondary | ICD-10-CM | POA: Diagnosis not present

## 2016-12-24 DIAGNOSIS — D509 Iron deficiency anemia, unspecified: Secondary | ICD-10-CM | POA: Diagnosis not present

## 2016-12-24 DIAGNOSIS — D631 Anemia in chronic kidney disease: Secondary | ICD-10-CM | POA: Diagnosis not present

## 2016-12-24 DIAGNOSIS — N2581 Secondary hyperparathyroidism of renal origin: Secondary | ICD-10-CM | POA: Diagnosis not present

## 2016-12-27 DIAGNOSIS — D509 Iron deficiency anemia, unspecified: Secondary | ICD-10-CM | POA: Diagnosis not present

## 2016-12-27 DIAGNOSIS — D631 Anemia in chronic kidney disease: Secondary | ICD-10-CM | POA: Diagnosis not present

## 2016-12-27 DIAGNOSIS — N2581 Secondary hyperparathyroidism of renal origin: Secondary | ICD-10-CM | POA: Diagnosis not present

## 2016-12-27 DIAGNOSIS — N186 End stage renal disease: Secondary | ICD-10-CM | POA: Diagnosis not present

## 2016-12-29 DIAGNOSIS — D631 Anemia in chronic kidney disease: Secondary | ICD-10-CM | POA: Diagnosis not present

## 2016-12-29 DIAGNOSIS — N186 End stage renal disease: Secondary | ICD-10-CM | POA: Diagnosis not present

## 2016-12-29 DIAGNOSIS — N2581 Secondary hyperparathyroidism of renal origin: Secondary | ICD-10-CM | POA: Diagnosis not present

## 2016-12-29 DIAGNOSIS — D509 Iron deficiency anemia, unspecified: Secondary | ICD-10-CM | POA: Diagnosis not present

## 2016-12-31 DIAGNOSIS — N2581 Secondary hyperparathyroidism of renal origin: Secondary | ICD-10-CM | POA: Diagnosis not present

## 2016-12-31 DIAGNOSIS — D509 Iron deficiency anemia, unspecified: Secondary | ICD-10-CM | POA: Diagnosis not present

## 2016-12-31 DIAGNOSIS — D631 Anemia in chronic kidney disease: Secondary | ICD-10-CM | POA: Diagnosis not present

## 2016-12-31 DIAGNOSIS — N186 End stage renal disease: Secondary | ICD-10-CM | POA: Diagnosis not present

## 2017-01-03 DIAGNOSIS — N2581 Secondary hyperparathyroidism of renal origin: Secondary | ICD-10-CM | POA: Diagnosis not present

## 2017-01-03 DIAGNOSIS — N186 End stage renal disease: Secondary | ICD-10-CM | POA: Diagnosis not present

## 2017-01-03 DIAGNOSIS — D631 Anemia in chronic kidney disease: Secondary | ICD-10-CM | POA: Diagnosis not present

## 2017-01-03 DIAGNOSIS — D509 Iron deficiency anemia, unspecified: Secondary | ICD-10-CM | POA: Diagnosis not present

## 2017-01-05 DIAGNOSIS — N2581 Secondary hyperparathyroidism of renal origin: Secondary | ICD-10-CM | POA: Diagnosis not present

## 2017-01-05 DIAGNOSIS — N186 End stage renal disease: Secondary | ICD-10-CM | POA: Diagnosis not present

## 2017-01-05 DIAGNOSIS — D631 Anemia in chronic kidney disease: Secondary | ICD-10-CM | POA: Diagnosis not present

## 2017-01-05 DIAGNOSIS — D509 Iron deficiency anemia, unspecified: Secondary | ICD-10-CM | POA: Diagnosis not present

## 2017-01-07 DIAGNOSIS — D631 Anemia in chronic kidney disease: Secondary | ICD-10-CM | POA: Diagnosis not present

## 2017-01-07 DIAGNOSIS — D509 Iron deficiency anemia, unspecified: Secondary | ICD-10-CM | POA: Diagnosis not present

## 2017-01-07 DIAGNOSIS — N186 End stage renal disease: Secondary | ICD-10-CM | POA: Diagnosis not present

## 2017-01-07 DIAGNOSIS — N2581 Secondary hyperparathyroidism of renal origin: Secondary | ICD-10-CM | POA: Diagnosis not present

## 2017-01-10 DIAGNOSIS — D509 Iron deficiency anemia, unspecified: Secondary | ICD-10-CM | POA: Diagnosis not present

## 2017-01-10 DIAGNOSIS — N2581 Secondary hyperparathyroidism of renal origin: Secondary | ICD-10-CM | POA: Diagnosis not present

## 2017-01-10 DIAGNOSIS — D631 Anemia in chronic kidney disease: Secondary | ICD-10-CM | POA: Diagnosis not present

## 2017-01-10 DIAGNOSIS — N186 End stage renal disease: Secondary | ICD-10-CM | POA: Diagnosis not present

## 2017-01-12 DIAGNOSIS — D631 Anemia in chronic kidney disease: Secondary | ICD-10-CM | POA: Diagnosis not present

## 2017-01-12 DIAGNOSIS — N186 End stage renal disease: Secondary | ICD-10-CM | POA: Diagnosis not present

## 2017-01-12 DIAGNOSIS — D509 Iron deficiency anemia, unspecified: Secondary | ICD-10-CM | POA: Diagnosis not present

## 2017-01-12 DIAGNOSIS — N2581 Secondary hyperparathyroidism of renal origin: Secondary | ICD-10-CM | POA: Diagnosis not present

## 2017-01-14 DIAGNOSIS — D631 Anemia in chronic kidney disease: Secondary | ICD-10-CM | POA: Diagnosis not present

## 2017-01-14 DIAGNOSIS — N186 End stage renal disease: Secondary | ICD-10-CM | POA: Diagnosis not present

## 2017-01-14 DIAGNOSIS — D509 Iron deficiency anemia, unspecified: Secondary | ICD-10-CM | POA: Diagnosis not present

## 2017-01-14 DIAGNOSIS — N2581 Secondary hyperparathyroidism of renal origin: Secondary | ICD-10-CM | POA: Diagnosis not present

## 2017-01-17 DIAGNOSIS — N2581 Secondary hyperparathyroidism of renal origin: Secondary | ICD-10-CM | POA: Diagnosis not present

## 2017-01-17 DIAGNOSIS — I120 Hypertensive chronic kidney disease with stage 5 chronic kidney disease or end stage renal disease: Secondary | ICD-10-CM | POA: Diagnosis not present

## 2017-01-17 DIAGNOSIS — N186 End stage renal disease: Secondary | ICD-10-CM | POA: Diagnosis not present

## 2017-01-17 DIAGNOSIS — D509 Iron deficiency anemia, unspecified: Secondary | ICD-10-CM | POA: Diagnosis not present

## 2017-01-17 DIAGNOSIS — Z992 Dependence on renal dialysis: Secondary | ICD-10-CM | POA: Diagnosis not present

## 2017-01-17 DIAGNOSIS — D631 Anemia in chronic kidney disease: Secondary | ICD-10-CM | POA: Diagnosis not present

## 2017-01-19 DIAGNOSIS — N2581 Secondary hyperparathyroidism of renal origin: Secondary | ICD-10-CM | POA: Diagnosis not present

## 2017-01-19 DIAGNOSIS — D509 Iron deficiency anemia, unspecified: Secondary | ICD-10-CM | POA: Diagnosis not present

## 2017-01-19 DIAGNOSIS — N186 End stage renal disease: Secondary | ICD-10-CM | POA: Diagnosis not present

## 2017-01-19 DIAGNOSIS — D631 Anemia in chronic kidney disease: Secondary | ICD-10-CM | POA: Diagnosis not present

## 2017-01-21 DIAGNOSIS — D631 Anemia in chronic kidney disease: Secondary | ICD-10-CM | POA: Diagnosis not present

## 2017-01-21 DIAGNOSIS — N2581 Secondary hyperparathyroidism of renal origin: Secondary | ICD-10-CM | POA: Diagnosis not present

## 2017-01-21 DIAGNOSIS — D509 Iron deficiency anemia, unspecified: Secondary | ICD-10-CM | POA: Diagnosis not present

## 2017-01-21 DIAGNOSIS — N186 End stage renal disease: Secondary | ICD-10-CM | POA: Diagnosis not present

## 2017-01-24 DIAGNOSIS — N186 End stage renal disease: Secondary | ICD-10-CM | POA: Diagnosis not present

## 2017-01-24 DIAGNOSIS — D631 Anemia in chronic kidney disease: Secondary | ICD-10-CM | POA: Diagnosis not present

## 2017-01-24 DIAGNOSIS — D509 Iron deficiency anemia, unspecified: Secondary | ICD-10-CM | POA: Diagnosis not present

## 2017-01-24 DIAGNOSIS — N2581 Secondary hyperparathyroidism of renal origin: Secondary | ICD-10-CM | POA: Diagnosis not present

## 2017-01-26 DIAGNOSIS — N2581 Secondary hyperparathyroidism of renal origin: Secondary | ICD-10-CM | POA: Diagnosis not present

## 2017-01-26 DIAGNOSIS — D631 Anemia in chronic kidney disease: Secondary | ICD-10-CM | POA: Diagnosis not present

## 2017-01-26 DIAGNOSIS — D509 Iron deficiency anemia, unspecified: Secondary | ICD-10-CM | POA: Diagnosis not present

## 2017-01-26 DIAGNOSIS — N186 End stage renal disease: Secondary | ICD-10-CM | POA: Diagnosis not present

## 2017-01-28 DIAGNOSIS — D631 Anemia in chronic kidney disease: Secondary | ICD-10-CM | POA: Diagnosis not present

## 2017-01-28 DIAGNOSIS — N186 End stage renal disease: Secondary | ICD-10-CM | POA: Diagnosis not present

## 2017-01-28 DIAGNOSIS — N2581 Secondary hyperparathyroidism of renal origin: Secondary | ICD-10-CM | POA: Diagnosis not present

## 2017-01-28 DIAGNOSIS — D509 Iron deficiency anemia, unspecified: Secondary | ICD-10-CM | POA: Diagnosis not present

## 2017-01-31 DIAGNOSIS — D509 Iron deficiency anemia, unspecified: Secondary | ICD-10-CM | POA: Diagnosis not present

## 2017-01-31 DIAGNOSIS — N186 End stage renal disease: Secondary | ICD-10-CM | POA: Diagnosis not present

## 2017-01-31 DIAGNOSIS — N2581 Secondary hyperparathyroidism of renal origin: Secondary | ICD-10-CM | POA: Diagnosis not present

## 2017-01-31 DIAGNOSIS — D631 Anemia in chronic kidney disease: Secondary | ICD-10-CM | POA: Diagnosis not present

## 2017-02-02 DIAGNOSIS — D509 Iron deficiency anemia, unspecified: Secondary | ICD-10-CM | POA: Diagnosis not present

## 2017-02-02 DIAGNOSIS — N186 End stage renal disease: Secondary | ICD-10-CM | POA: Diagnosis not present

## 2017-02-02 DIAGNOSIS — N2581 Secondary hyperparathyroidism of renal origin: Secondary | ICD-10-CM | POA: Diagnosis not present

## 2017-02-02 DIAGNOSIS — D631 Anemia in chronic kidney disease: Secondary | ICD-10-CM | POA: Diagnosis not present

## 2017-02-04 DIAGNOSIS — N2581 Secondary hyperparathyroidism of renal origin: Secondary | ICD-10-CM | POA: Diagnosis not present

## 2017-02-04 DIAGNOSIS — D631 Anemia in chronic kidney disease: Secondary | ICD-10-CM | POA: Diagnosis not present

## 2017-02-04 DIAGNOSIS — N186 End stage renal disease: Secondary | ICD-10-CM | POA: Diagnosis not present

## 2017-02-04 DIAGNOSIS — D509 Iron deficiency anemia, unspecified: Secondary | ICD-10-CM | POA: Diagnosis not present

## 2017-02-07 DIAGNOSIS — D509 Iron deficiency anemia, unspecified: Secondary | ICD-10-CM | POA: Diagnosis not present

## 2017-02-07 DIAGNOSIS — N2581 Secondary hyperparathyroidism of renal origin: Secondary | ICD-10-CM | POA: Diagnosis not present

## 2017-02-07 DIAGNOSIS — D631 Anemia in chronic kidney disease: Secondary | ICD-10-CM | POA: Diagnosis not present

## 2017-02-07 DIAGNOSIS — N186 End stage renal disease: Secondary | ICD-10-CM | POA: Diagnosis not present

## 2017-02-09 DIAGNOSIS — N2581 Secondary hyperparathyroidism of renal origin: Secondary | ICD-10-CM | POA: Diagnosis not present

## 2017-02-09 DIAGNOSIS — D631 Anemia in chronic kidney disease: Secondary | ICD-10-CM | POA: Diagnosis not present

## 2017-02-09 DIAGNOSIS — N186 End stage renal disease: Secondary | ICD-10-CM | POA: Diagnosis not present

## 2017-02-09 DIAGNOSIS — D509 Iron deficiency anemia, unspecified: Secondary | ICD-10-CM | POA: Diagnosis not present

## 2017-02-11 DIAGNOSIS — N2581 Secondary hyperparathyroidism of renal origin: Secondary | ICD-10-CM | POA: Diagnosis not present

## 2017-02-11 DIAGNOSIS — N186 End stage renal disease: Secondary | ICD-10-CM | POA: Diagnosis not present

## 2017-02-11 DIAGNOSIS — D509 Iron deficiency anemia, unspecified: Secondary | ICD-10-CM | POA: Diagnosis not present

## 2017-02-11 DIAGNOSIS — D631 Anemia in chronic kidney disease: Secondary | ICD-10-CM | POA: Diagnosis not present

## 2017-02-15 DIAGNOSIS — N186 End stage renal disease: Secondary | ICD-10-CM | POA: Diagnosis not present

## 2017-02-15 DIAGNOSIS — D509 Iron deficiency anemia, unspecified: Secondary | ICD-10-CM | POA: Diagnosis not present

## 2017-02-15 DIAGNOSIS — N2581 Secondary hyperparathyroidism of renal origin: Secondary | ICD-10-CM | POA: Diagnosis not present

## 2017-02-15 DIAGNOSIS — D631 Anemia in chronic kidney disease: Secondary | ICD-10-CM | POA: Diagnosis not present

## 2017-02-16 DIAGNOSIS — D509 Iron deficiency anemia, unspecified: Secondary | ICD-10-CM | POA: Diagnosis not present

## 2017-02-16 DIAGNOSIS — N2581 Secondary hyperparathyroidism of renal origin: Secondary | ICD-10-CM | POA: Diagnosis not present

## 2017-02-16 DIAGNOSIS — N186 End stage renal disease: Secondary | ICD-10-CM | POA: Diagnosis not present

## 2017-02-16 DIAGNOSIS — D631 Anemia in chronic kidney disease: Secondary | ICD-10-CM | POA: Diagnosis not present

## 2017-02-17 DIAGNOSIS — N186 End stage renal disease: Secondary | ICD-10-CM | POA: Diagnosis not present

## 2017-02-17 DIAGNOSIS — Z992 Dependence on renal dialysis: Secondary | ICD-10-CM | POA: Diagnosis not present

## 2017-02-17 DIAGNOSIS — T82858A Stenosis of vascular prosthetic devices, implants and grafts, initial encounter: Secondary | ICD-10-CM | POA: Diagnosis not present

## 2017-02-17 DIAGNOSIS — I871 Compression of vein: Secondary | ICD-10-CM | POA: Diagnosis not present

## 2017-02-17 DIAGNOSIS — I120 Hypertensive chronic kidney disease with stage 5 chronic kidney disease or end stage renal disease: Secondary | ICD-10-CM | POA: Diagnosis not present

## 2017-02-18 DIAGNOSIS — I871 Compression of vein: Secondary | ICD-10-CM | POA: Diagnosis not present

## 2017-02-18 DIAGNOSIS — Z992 Dependence on renal dialysis: Secondary | ICD-10-CM | POA: Diagnosis not present

## 2017-02-18 DIAGNOSIS — T82868A Thrombosis of vascular prosthetic devices, implants and grafts, initial encounter: Secondary | ICD-10-CM | POA: Diagnosis not present

## 2017-02-18 DIAGNOSIS — N186 End stage renal disease: Secondary | ICD-10-CM | POA: Diagnosis not present

## 2017-02-19 ENCOUNTER — Emergency Department (HOSPITAL_COMMUNITY)
Admission: EM | Admit: 2017-02-19 | Discharge: 2017-02-19 | Disposition: A | Discharge status: Home / Self Care | Payer: Medicare Other | Attending: Emergency Medicine | Admitting: Emergency Medicine

## 2017-02-19 ENCOUNTER — Encounter (HOSPITAL_COMMUNITY): Payer: Self-pay | Admitting: Emergency Medicine

## 2017-02-19 ENCOUNTER — Emergency Department (HOSPITAL_BASED_OUTPATIENT_CLINIC_OR_DEPARTMENT_OTHER)
Admit: 2017-02-19 | Discharge: 2017-02-19 | Disposition: A | Discharge status: Home / Self Care | Payer: Medicare Other | Attending: Emergency Medicine | Admitting: Emergency Medicine

## 2017-02-19 DIAGNOSIS — Z992 Dependence on renal dialysis: Secondary | ICD-10-CM | POA: Diagnosis not present

## 2017-02-19 DIAGNOSIS — Z5189 Encounter for other specified aftercare: Secondary | ICD-10-CM

## 2017-02-19 DIAGNOSIS — M79609 Pain in unspecified limb: Secondary | ICD-10-CM

## 2017-02-19 DIAGNOSIS — N186 End stage renal disease: Secondary | ICD-10-CM | POA: Diagnosis not present

## 2017-02-19 DIAGNOSIS — T82398A Other mechanical complication of other vascular grafts, initial encounter: Secondary | ICD-10-CM | POA: Diagnosis not present

## 2017-02-19 DIAGNOSIS — I120 Hypertensive chronic kidney disease with stage 5 chronic kidney disease or end stage renal disease: Secondary | ICD-10-CM | POA: Insufficient documentation

## 2017-02-19 DIAGNOSIS — D509 Iron deficiency anemia, unspecified: Secondary | ICD-10-CM | POA: Diagnosis not present

## 2017-02-19 DIAGNOSIS — N2581 Secondary hyperparathyroidism of renal origin: Secondary | ICD-10-CM | POA: Diagnosis not present

## 2017-02-19 DIAGNOSIS — I9789 Other postprocedural complications and disorders of the circulatory system, not elsewhere classified: Secondary | ICD-10-CM | POA: Insufficient documentation

## 2017-02-19 DIAGNOSIS — M7989 Other specified soft tissue disorders: Secondary | ICD-10-CM | POA: Diagnosis not present

## 2017-02-19 DIAGNOSIS — G8918 Other acute postprocedural pain: Secondary | ICD-10-CM | POA: Diagnosis not present

## 2017-02-19 LAB — BASIC METABOLIC PANEL
Anion gap: 20 — ABNORMAL HIGH (ref 5–15)
BUN: 68 mg/dL — AB (ref 6–20)
CHLORIDE: 100 mmol/L — AB (ref 101–111)
CO2: 15 mmol/L — AB (ref 22–32)
CREATININE: 14.89 mg/dL — AB (ref 0.44–1.00)
Calcium: 6.8 mg/dL — ABNORMAL LOW (ref 8.9–10.3)
GFR calc Af Amer: 3 mL/min — ABNORMAL LOW (ref >60)
GFR calc non Af Amer: 2 mL/min — ABNORMAL LOW (ref >60)
Glucose, Bld: 89 mg/dL (ref 65–99)
Potassium: 4.2 mmol/L (ref 3.5–5.1)
SODIUM: 135 mmol/L (ref 135–145)

## 2017-02-19 LAB — CBC
HEMATOCRIT: 46.1 % — AB (ref 36.0–46.0)
HEMOGLOBIN: 15.3 g/dL — AB (ref 12.0–15.0)
MCH: 28.5 pg (ref 26.0–34.0)
MCHC: 33.2 g/dL (ref 30.0–36.0)
MCV: 85.8 fL (ref 78.0–100.0)
Platelets: 154 10*3/uL (ref 150–400)
RBC: 5.37 MIL/uL — ABNORMAL HIGH (ref 3.87–5.11)
RDW: 14.6 % (ref 11.5–15.5)
WBC: 9.3 10*3/uL (ref 4.0–10.5)

## 2017-02-19 LAB — I-STAT CG4 LACTIC ACID, ED: Lactic Acid, Venous: 1.03 mmol/L (ref 0.5–1.9)

## 2017-02-19 NOTE — ED Provider Notes (Signed)
[place] DEPT Provider Note   CSN: [id number] Arrival date & time: 02/19/17  5170     History   Chief Complaint Chief Complaint  Patient presents with  . Post-op Problem    HPI Victoria [name] is a 61 y.o. female.  HPI Patient had thrombectomy of her right upper extremity dialysis graft yesterday performed by Dr. Augustin Coupe at [facility]. She went to dialysis today and was told to come to the emergency department due to swelling of her right forearm and pain. Patient denies having any fever or chills. States she otherwise feels well. Denies numbness or tingling of the hand. Past Medical History:  Diagnosis Date  . Chronic bronchitis ([place])   . Complication of anesthesia ~ 2011   "they gave me a medicine that swolled me and mouth burning up" (08/23/2012)  . Complication of anesthesia    slow to wake up  . ESRD (end stage renal disease) on dialysis [facility]) Nephrologist-- dr deterding   ESRD due to HTN-; "M/W/F; [place]" (11/28/2015  . GERD (gastroesophageal reflux disease)   . History of acute pulmonary edema    2003  . Hypertension    no longer on medications  . Left patella fracture   . Pneumonia    as a child  . Seasonal allergies   . Secondary hyperparathyroidism, renal ([place])    s/p  total parathyroidectomy  2014  . Thyroid disease   . Wears glasses     Patient Active Problem List   Diagnosis Date Noted  . Dehiscence of operative wound 11/28/2015  . Wound dehiscence, surgical 11/28/2015  . Renal dialysis device, implant, or graft complication 01/74/9449  . Aftercare following surgery of the circulatory system, Susan Moore 03/30/2013  . Mechanical complication of other vascular device, implant, and graft 01/12/2013  . Other and unspecified hyperlipidemia 10/25/2012  . End stage renal disease ([place]) 09/22/2012  . Chest pain, mid sternal 08/23/2012  . Hyperparathyroidism, secondary renal ([place]) 03/22/2011  . Thyroid nodule 03/22/2011  . High blood pressure  03/10/2011  . Kidney disease 03/10/2011    Past Surgical History:  Procedure Laterality Date  . ANKLE FRACTURE SURGERY Right ~ 2005   "has pins in it"  . APPLICATION OF WOUND VAC Left 11/28/2015   knee  . APPLICATION OF WOUND VAC Left 11/28/2015   Procedure: APPLICATION OF WOUND VAC;  Surgeon: Rod Can, MD;  Location: [place];  Service: Orthopedics;  Laterality: Left;  . ARTERIOVENOUS GRAFT PLACEMENT  03-25-2005   Right forearm  w/  multiple Revision's   . CARDIOVASCULAR STRESS TEST  08-24-2012   abnormal nuclear study/  inferolateral and anteroseptal areas of scar,  no ischemia/  normal LV function and wall motion, ef 77%  . DIALYSIS FISTULA CREATION  1992   left upper arm ---  w/  Multiple Revision's until 2006  . ECTOPIC PREGNANCY SURGERY  1970's  . FRACTURE SURGERY    . I&D EXTREMITY Left 11/28/2015   Procedure: IRRIGATION AND DEBRIDEMENT LEFT KNEE WOUND ;  Surgeon: Rod Can, MD;  Location: [place];  Service: Orthopedics;  Laterality: Left;  . INCISION AND DRAINAGE OF WOUND Left 11/28/2015   knee  . ORIF PATELLA Left 10/31/2015   Procedure: OPEN REDUCTION INTERNAL (ORIF) FIXATION LEFT PATELLA;  Surgeon: Rod Can, MD;  Location: [place];  Service: Orthopedics;  Laterality: Left;  . PATELLAR TENDON REPAIR  10/03/2012   Procedure: PATELLA TENDON REPAIR;  Surgeon: Marin Shutter, MD;  Location: [place];  Service: Orthopedics;  Laterality: Right;  . PATELLECTOMY  10/03/2012   Procedure: PATELLECTOMY;  Surgeon: Marin Shutter, MD;  Location: [place];  Service: Orthopedics;  Laterality: Right;  RIGHT PARTIAL PATELLECTOMY AND PATELLA TENDON REPAIR  . REVISION OF ARTERIOVENOUS GORETEX GRAFT  09/27/2012   Procedure: REVISION OF ARTERIOVENOUS GORETEX GRAFT;  Surgeon: Mal Misty, MD;  Location: [place];  Service: Vascular;  Laterality: Right;  1) Replacement of venous half of loop with 40mm Gortex graft  2) Excision of erroded pseudoaneurysm of graft with primary closure.    Marland Kitchen REVISION OF ARTERIOVENOUS GORETEX GRAFT Right 01/23/2013   Procedure: REVISION OF ARTERIOVENOUS GORETEX GRAFT;  Surgeon: Conrad Pershing, MD;  Location: [facility] OR;  Service: Vascular;  Laterality: Right;  Using piece of 29mm x 20cm Gortex graft.   Marland Kitchen REVISION OF ARTERIOVENOUS GORETEX GRAFT Right 10/07/2015   Procedure: REVISION OF Right arm ARTERIOVENOUS GORETEX GRAFT;  Surgeon: Elam Dutch, MD;  Location: [facility] OR;  Service: Vascular;  Laterality: Right;  . REVISION OF ARTERIOVENOUS GORETEX GRAFT Right 02/17/2016   Procedure: REVISION OF ARTERIOVENOUS GORETEX GRAFT;  Surgeon: Elam Dutch, MD;  Location: [place];  Service: Vascular;  Laterality: Right;  . TOTAL ABDOMINAL HYSTERECTOMY  03-05-2005   w/  Right Ovarian Cystectomy  . TOTAL PARATHYROIDECTOMY/  THYROID ISTHMUSECTOMY/  AUTOTRANSPLANTATION PARATHYROID TISSUE TO LEFT BRACHIORIADIALIS MUSCLE  02-18-2011  . TRANSTHORACIC ECHOCARDIOGRAM  10-31-2012   mild LVH,  grade 1 diastolic dysfunction,  ef 55-65%/  mild MR/  trivial TR    OB History    No data available       Home Medications    Prior to Admission medications   Medication Sig Start Date End Date Taking? Authorizing Provider  calcium acetate (PHOSLO) 667 MG capsule Take 2,001 mg by mouth 3 (three) times daily with meals.    Yes [provider]  aspirin 81 MG tablet Take 1 tablet (81 mg total) by mouth 2 (two) times daily after a meal. Reported on 10/02/2015 Patient not taking: Reported on 02/19/2017 10/31/15   Rod Can, MD  cephALEXin (KEFLEX) 500 MG capsule Take 1 capsule (500 mg total) by mouth 4 (four) times daily. Patient not taking: Reported on 02/19/2017 03/31/16   Shary Decamp, PA-C    Family History Family History  Problem Relation Age of Onset  . Hypertension Father   . Thyroid disease Father   . Hypertension Sister   . Thyroid disease Sister     Social History Social History  Substance Use Topics  . Smoking status: Never Smoker  . Smokeless tobacco:  Never Used  . Alcohol use No     Comment: quit 2007     Allergies   Amlodipine and Lisinopril   Review of Systems Review of Systems  Constitutional: Negative for chills and fever.  Respiratory: Negative for cough and shortness of breath.   Cardiovascular: Negative for chest pain, palpitations and leg swelling.  Gastrointestinal: Negative for abdominal pain, diarrhea and nausea.  Musculoskeletal: Negative for arthralgias and joint swelling.  Neurological: Negative for dizziness, weakness, numbness and headaches.  All other systems reviewed and are negative.    Physical Exam Updated Vital Signs BP 103/75   Pulse 78   Temp 97.9 F (36.6 C) (Oral)   Resp 17   SpO2 97%   Physical Exam  Constitutional: She is oriented to person, place, and time. She appears well-developed and well-nourished. No distress.  HENT:  Head: Normocephalic and atraumatic.  Mouth/Throat: Oropharynx  is clear and moist.  Eyes: EOM are normal. Pupils are equal, round, and reactive to light.  Neck: Normal range of motion. Neck supple.  Cardiovascular: Normal rate and regular rhythm.  Exam reveals no gallop and no friction rub.   No murmur heard. Pulmonary/Chest: Effort normal and breath sounds normal.  Abdominal: Soft. Bowel sounds are normal. There is no tenderness. There is no rebound and no guarding.  Musculoskeletal: Normal range of motion. She exhibits edema and tenderness.  Diagnosis graft in right forearm. There is diffuse swelling at the site and some mild warmth. No definite erythema, induration, fluctuance. Surgical sites appear to be intact. Distal pulses are 2+.  Neurological: She is alert and oriented to person, place, and time.  Skin: Skin is warm and dry. No rash noted. No erythema.  Psychiatric: She has a normal mood and affect. Her behavior is normal.  Nursing note and vitals reviewed.    ED Treatments / Results  Labs (all labs ordered are listed, but only abnormal results are  displayed) Labs Reviewed  CBC - Abnormal; Notable for the following:       Result Value   RBC 5.37 (*)    Hemoglobin 15.3 (*)    HCT 46.1 (*)    All other components within normal limits  BASIC METABOLIC PANEL - Abnormal; Notable for the following:    Chloride 100 (*)    CO2 15 (*)    BUN 68 (*)    Creatinine, Ser 14.89 (*)    Calcium 6.8 (*)    GFR calc non Af Amer 2 (*)    GFR calc Af Amer 3 (*)    Anion gap 20 (*)    All other components within normal limits  I-STAT CG4 LACTIC ACID, ED    EKG  EKG Interpretation None       Radiology No results found.  Procedures Procedures (including critical care time)  Medications Ordered in ED Medications - No data to display   Initial Impression / Assessment and Plan / ED Course  I have reviewed the triage vital signs and the nursing notes.  Pertinent labs & imaging results that were available during my care of the patient were reviewed by me and considered in my medical decision making (see chart for details).     No evidence of DVT on ultrasound. Normal white blood cell count. Patient is afebrile. Discuss with Dr. Posey Pronto who will evaluate patient in the emergency department and disposition. Dr. Posey Pronto evaluated patient in the emergency department. Has arranged for patient to receive dialysis today. States they will give the patient vancomycin for possible early infection. Recommending discharge home. Patient continues to be well-appearing. Final Clinical Impressions(s) / ED Diagnoses   Final diagnoses:  Visit for wound check  Post-operative pain    New Prescriptions Discharge Medication List as of 02/19/2017 12:04 PM       Julianne Rice, MD 02/19/17 1231

## 2017-02-19 NOTE — Progress Notes (Signed)
**  Preliminary report by tech**  Right upper extremity venous duplex complete. There is no evidence of deep or superficial vein thrombosis involving the right upper extremity. Results were given to the patient's nurse.  02/19/17 10:46 AM Victoria [name] RVT

## 2017-02-19 NOTE — ED Triage Notes (Signed)
Pt states she had graft surgery on her right arm Thursday, some revisions were done to it yesterday, and today she was to present to dialysis.  Dialysis sent her to the ED d/t swelling of the forearm and above her elbow. Pt states this is painful and sore.

## 2017-02-19 NOTE — ED Notes (Signed)
Returned from vascular

## 2017-02-19 NOTE — Discharge Instructions (Signed)
Go directly to your dialysis clinic for hemodialysis. Return for worsening swelling, redness, fever or for any concerns

## 2017-02-21 DIAGNOSIS — D509 Iron deficiency anemia, unspecified: Secondary | ICD-10-CM | POA: Diagnosis not present

## 2017-02-21 DIAGNOSIS — N2581 Secondary hyperparathyroidism of renal origin: Secondary | ICD-10-CM | POA: Diagnosis not present

## 2017-02-21 DIAGNOSIS — N186 End stage renal disease: Secondary | ICD-10-CM | POA: Diagnosis not present

## 2017-02-21 DIAGNOSIS — T82398A Other mechanical complication of other vascular grafts, initial encounter: Secondary | ICD-10-CM | POA: Diagnosis not present

## 2017-02-23 DIAGNOSIS — T82398A Other mechanical complication of other vascular grafts, initial encounter: Secondary | ICD-10-CM | POA: Diagnosis not present

## 2017-02-23 DIAGNOSIS — N186 End stage renal disease: Secondary | ICD-10-CM | POA: Diagnosis not present

## 2017-02-23 DIAGNOSIS — D509 Iron deficiency anemia, unspecified: Secondary | ICD-10-CM | POA: Diagnosis not present

## 2017-02-23 DIAGNOSIS — N2581 Secondary hyperparathyroidism of renal origin: Secondary | ICD-10-CM | POA: Diagnosis not present

## 2017-02-24 ENCOUNTER — Other Ambulatory Visit: Payer: Self-pay | Admitting: Nephrology

## 2017-02-24 DIAGNOSIS — N289 Disorder of kidney and ureter, unspecified: Secondary | ICD-10-CM

## 2017-02-25 DIAGNOSIS — N186 End stage renal disease: Secondary | ICD-10-CM | POA: Diagnosis not present

## 2017-02-25 DIAGNOSIS — D509 Iron deficiency anemia, unspecified: Secondary | ICD-10-CM | POA: Diagnosis not present

## 2017-02-25 DIAGNOSIS — T82398A Other mechanical complication of other vascular grafts, initial encounter: Secondary | ICD-10-CM | POA: Diagnosis not present

## 2017-02-25 DIAGNOSIS — N2581 Secondary hyperparathyroidism of renal origin: Secondary | ICD-10-CM | POA: Diagnosis not present

## 2017-02-28 DIAGNOSIS — N186 End stage renal disease: Secondary | ICD-10-CM | POA: Diagnosis not present

## 2017-02-28 DIAGNOSIS — N2581 Secondary hyperparathyroidism of renal origin: Secondary | ICD-10-CM | POA: Diagnosis not present

## 2017-02-28 DIAGNOSIS — T82398A Other mechanical complication of other vascular grafts, initial encounter: Secondary | ICD-10-CM | POA: Diagnosis not present

## 2017-02-28 DIAGNOSIS — D509 Iron deficiency anemia, unspecified: Secondary | ICD-10-CM | POA: Diagnosis not present

## 2017-03-01 ENCOUNTER — Ambulatory Visit
Admission: RE | Admit: 2017-03-01 | Discharge: 2017-03-01 | Disposition: A | Discharge status: Home / Self Care | Payer: Medicare Other | Source: Ambulatory Visit | Attending: Nephrology | Admitting: Nephrology

## 2017-03-01 DIAGNOSIS — N19 Unspecified kidney failure: Secondary | ICD-10-CM | POA: Diagnosis not present

## 2017-03-01 DIAGNOSIS — N289 Disorder of kidney and ureter, unspecified: Secondary | ICD-10-CM

## 2017-03-01 DIAGNOSIS — K5790 Diverticulosis of intestine, part unspecified, without perforation or abscess without bleeding: Secondary | ICD-10-CM | POA: Diagnosis not present

## 2017-03-02 DIAGNOSIS — D509 Iron deficiency anemia, unspecified: Secondary | ICD-10-CM | POA: Diagnosis not present

## 2017-03-02 DIAGNOSIS — N186 End stage renal disease: Secondary | ICD-10-CM | POA: Diagnosis not present

## 2017-03-02 DIAGNOSIS — T82398A Other mechanical complication of other vascular grafts, initial encounter: Secondary | ICD-10-CM | POA: Diagnosis not present

## 2017-03-02 DIAGNOSIS — N2581 Secondary hyperparathyroidism of renal origin: Secondary | ICD-10-CM | POA: Diagnosis not present

## 2017-03-04 DIAGNOSIS — N2581 Secondary hyperparathyroidism of renal origin: Secondary | ICD-10-CM | POA: Diagnosis not present

## 2017-03-04 DIAGNOSIS — D509 Iron deficiency anemia, unspecified: Secondary | ICD-10-CM | POA: Diagnosis not present

## 2017-03-04 DIAGNOSIS — N186 End stage renal disease: Secondary | ICD-10-CM | POA: Diagnosis not present

## 2017-03-04 DIAGNOSIS — T82398A Other mechanical complication of other vascular grafts, initial encounter: Secondary | ICD-10-CM | POA: Diagnosis not present

## 2017-03-07 DIAGNOSIS — N2581 Secondary hyperparathyroidism of renal origin: Secondary | ICD-10-CM | POA: Diagnosis not present

## 2017-03-07 DIAGNOSIS — T82398A Other mechanical complication of other vascular grafts, initial encounter: Secondary | ICD-10-CM | POA: Diagnosis not present

## 2017-03-07 DIAGNOSIS — N186 End stage renal disease: Secondary | ICD-10-CM | POA: Diagnosis not present

## 2017-03-07 DIAGNOSIS — D509 Iron deficiency anemia, unspecified: Secondary | ICD-10-CM | POA: Diagnosis not present

## 2017-03-09 DIAGNOSIS — N186 End stage renal disease: Secondary | ICD-10-CM | POA: Diagnosis not present

## 2017-03-09 DIAGNOSIS — T82398A Other mechanical complication of other vascular grafts, initial encounter: Secondary | ICD-10-CM | POA: Diagnosis not present

## 2017-03-09 DIAGNOSIS — D509 Iron deficiency anemia, unspecified: Secondary | ICD-10-CM | POA: Diagnosis not present

## 2017-03-09 DIAGNOSIS — N2581 Secondary hyperparathyroidism of renal origin: Secondary | ICD-10-CM | POA: Diagnosis not present

## 2017-03-11 DIAGNOSIS — D509 Iron deficiency anemia, unspecified: Secondary | ICD-10-CM | POA: Diagnosis not present

## 2017-03-11 DIAGNOSIS — N186 End stage renal disease: Secondary | ICD-10-CM | POA: Diagnosis not present

## 2017-03-11 DIAGNOSIS — N2581 Secondary hyperparathyroidism of renal origin: Secondary | ICD-10-CM | POA: Diagnosis not present

## 2017-03-11 DIAGNOSIS — T82398A Other mechanical complication of other vascular grafts, initial encounter: Secondary | ICD-10-CM | POA: Diagnosis not present

## 2017-03-14 DIAGNOSIS — N2581 Secondary hyperparathyroidism of renal origin: Secondary | ICD-10-CM | POA: Diagnosis not present

## 2017-03-14 DIAGNOSIS — N186 End stage renal disease: Secondary | ICD-10-CM | POA: Diagnosis not present

## 2017-03-14 DIAGNOSIS — T82398A Other mechanical complication of other vascular grafts, initial encounter: Secondary | ICD-10-CM | POA: Diagnosis not present

## 2017-03-14 DIAGNOSIS — D509 Iron deficiency anemia, unspecified: Secondary | ICD-10-CM | POA: Diagnosis not present

## 2017-03-16 DIAGNOSIS — T82398A Other mechanical complication of other vascular grafts, initial encounter: Secondary | ICD-10-CM | POA: Diagnosis not present

## 2017-03-16 DIAGNOSIS — N2581 Secondary hyperparathyroidism of renal origin: Secondary | ICD-10-CM | POA: Diagnosis not present

## 2017-03-16 DIAGNOSIS — N186 End stage renal disease: Secondary | ICD-10-CM | POA: Diagnosis not present

## 2017-03-16 DIAGNOSIS — D509 Iron deficiency anemia, unspecified: Secondary | ICD-10-CM | POA: Diagnosis not present

## 2017-03-18 DIAGNOSIS — N186 End stage renal disease: Secondary | ICD-10-CM | POA: Diagnosis not present

## 2017-03-18 DIAGNOSIS — T82398A Other mechanical complication of other vascular grafts, initial encounter: Secondary | ICD-10-CM | POA: Diagnosis not present

## 2017-03-18 DIAGNOSIS — N2581 Secondary hyperparathyroidism of renal origin: Secondary | ICD-10-CM | POA: Diagnosis not present

## 2017-03-18 DIAGNOSIS — D509 Iron deficiency anemia, unspecified: Secondary | ICD-10-CM | POA: Diagnosis not present

## 2017-03-19 DIAGNOSIS — I120 Hypertensive chronic kidney disease with stage 5 chronic kidney disease or end stage renal disease: Secondary | ICD-10-CM | POA: Diagnosis not present

## 2017-03-19 DIAGNOSIS — N186 End stage renal disease: Secondary | ICD-10-CM | POA: Diagnosis not present

## 2017-03-19 DIAGNOSIS — Z992 Dependence on renal dialysis: Secondary | ICD-10-CM | POA: Diagnosis not present

## 2017-03-21 DIAGNOSIS — N2581 Secondary hyperparathyroidism of renal origin: Secondary | ICD-10-CM | POA: Diagnosis not present

## 2017-03-21 DIAGNOSIS — N186 End stage renal disease: Secondary | ICD-10-CM | POA: Diagnosis not present

## 2017-03-21 DIAGNOSIS — D509 Iron deficiency anemia, unspecified: Secondary | ICD-10-CM | POA: Diagnosis not present

## 2017-03-23 DIAGNOSIS — N186 End stage renal disease: Secondary | ICD-10-CM | POA: Diagnosis not present

## 2017-03-23 DIAGNOSIS — D509 Iron deficiency anemia, unspecified: Secondary | ICD-10-CM | POA: Diagnosis not present

## 2017-03-23 DIAGNOSIS — N2581 Secondary hyperparathyroidism of renal origin: Secondary | ICD-10-CM | POA: Diagnosis not present

## 2017-03-25 DIAGNOSIS — N2581 Secondary hyperparathyroidism of renal origin: Secondary | ICD-10-CM | POA: Diagnosis not present

## 2017-03-25 DIAGNOSIS — D509 Iron deficiency anemia, unspecified: Secondary | ICD-10-CM | POA: Diagnosis not present

## 2017-03-25 DIAGNOSIS — N186 End stage renal disease: Secondary | ICD-10-CM | POA: Diagnosis not present

## 2017-03-28 ENCOUNTER — Other Ambulatory Visit: Payer: Self-pay | Admitting: Nephrology

## 2017-03-28 DIAGNOSIS — N2581 Secondary hyperparathyroidism of renal origin: Secondary | ICD-10-CM | POA: Diagnosis not present

## 2017-03-28 DIAGNOSIS — N186 End stage renal disease: Secondary | ICD-10-CM | POA: Diagnosis not present

## 2017-03-28 DIAGNOSIS — Z1231 Encounter for screening mammogram for malignant neoplasm of breast: Secondary | ICD-10-CM

## 2017-03-28 DIAGNOSIS — D509 Iron deficiency anemia, unspecified: Secondary | ICD-10-CM | POA: Diagnosis not present

## 2017-03-30 DIAGNOSIS — N186 End stage renal disease: Secondary | ICD-10-CM | POA: Diagnosis not present

## 2017-03-30 DIAGNOSIS — D509 Iron deficiency anemia, unspecified: Secondary | ICD-10-CM | POA: Diagnosis not present

## 2017-03-30 DIAGNOSIS — N2581 Secondary hyperparathyroidism of renal origin: Secondary | ICD-10-CM | POA: Diagnosis not present

## 2017-04-01 DIAGNOSIS — N186 End stage renal disease: Secondary | ICD-10-CM | POA: Diagnosis not present

## 2017-04-01 DIAGNOSIS — N2581 Secondary hyperparathyroidism of renal origin: Secondary | ICD-10-CM | POA: Diagnosis not present

## 2017-04-01 DIAGNOSIS — D509 Iron deficiency anemia, unspecified: Secondary | ICD-10-CM | POA: Diagnosis not present

## 2017-04-04 DIAGNOSIS — D509 Iron deficiency anemia, unspecified: Secondary | ICD-10-CM | POA: Diagnosis not present

## 2017-04-04 DIAGNOSIS — N2581 Secondary hyperparathyroidism of renal origin: Secondary | ICD-10-CM | POA: Diagnosis not present

## 2017-04-04 DIAGNOSIS — N186 End stage renal disease: Secondary | ICD-10-CM | POA: Diagnosis not present

## 2017-04-06 DIAGNOSIS — N186 End stage renal disease: Secondary | ICD-10-CM | POA: Diagnosis not present

## 2017-04-06 DIAGNOSIS — D509 Iron deficiency anemia, unspecified: Secondary | ICD-10-CM | POA: Diagnosis not present

## 2017-04-06 DIAGNOSIS — N2581 Secondary hyperparathyroidism of renal origin: Secondary | ICD-10-CM | POA: Diagnosis not present

## 2017-04-08 DIAGNOSIS — D509 Iron deficiency anemia, unspecified: Secondary | ICD-10-CM | POA: Diagnosis not present

## 2017-04-08 DIAGNOSIS — N186 End stage renal disease: Secondary | ICD-10-CM | POA: Diagnosis not present

## 2017-04-08 DIAGNOSIS — N2581 Secondary hyperparathyroidism of renal origin: Secondary | ICD-10-CM | POA: Diagnosis not present

## 2017-04-11 DIAGNOSIS — N2581 Secondary hyperparathyroidism of renal origin: Secondary | ICD-10-CM | POA: Diagnosis not present

## 2017-04-11 DIAGNOSIS — D509 Iron deficiency anemia, unspecified: Secondary | ICD-10-CM | POA: Diagnosis not present

## 2017-04-11 DIAGNOSIS — N186 End stage renal disease: Secondary | ICD-10-CM | POA: Diagnosis not present

## 2017-04-13 DIAGNOSIS — N186 End stage renal disease: Secondary | ICD-10-CM | POA: Diagnosis not present

## 2017-04-13 DIAGNOSIS — N2581 Secondary hyperparathyroidism of renal origin: Secondary | ICD-10-CM | POA: Diagnosis not present

## 2017-04-13 DIAGNOSIS — D509 Iron deficiency anemia, unspecified: Secondary | ICD-10-CM | POA: Diagnosis not present

## 2017-04-15 DIAGNOSIS — H2513 Age-related nuclear cataract, bilateral: Secondary | ICD-10-CM | POA: Diagnosis not present

## 2017-04-15 DIAGNOSIS — N2581 Secondary hyperparathyroidism of renal origin: Secondary | ICD-10-CM | POA: Diagnosis not present

## 2017-04-15 DIAGNOSIS — D509 Iron deficiency anemia, unspecified: Secondary | ICD-10-CM | POA: Diagnosis not present

## 2017-04-15 DIAGNOSIS — N186 End stage renal disease: Secondary | ICD-10-CM | POA: Diagnosis not present

## 2017-04-18 DIAGNOSIS — N186 End stage renal disease: Secondary | ICD-10-CM | POA: Diagnosis not present

## 2017-04-18 DIAGNOSIS — N2581 Secondary hyperparathyroidism of renal origin: Secondary | ICD-10-CM | POA: Diagnosis not present

## 2017-04-18 DIAGNOSIS — D509 Iron deficiency anemia, unspecified: Secondary | ICD-10-CM | POA: Diagnosis not present

## 2017-04-19 DIAGNOSIS — I120 Hypertensive chronic kidney disease with stage 5 chronic kidney disease or end stage renal disease: Secondary | ICD-10-CM | POA: Diagnosis not present

## 2017-04-19 DIAGNOSIS — Z992 Dependence on renal dialysis: Secondary | ICD-10-CM | POA: Diagnosis not present

## 2017-04-19 DIAGNOSIS — N186 End stage renal disease: Secondary | ICD-10-CM | POA: Diagnosis not present

## 2017-04-20 DIAGNOSIS — D509 Iron deficiency anemia, unspecified: Secondary | ICD-10-CM | POA: Diagnosis not present

## 2017-04-20 DIAGNOSIS — N2581 Secondary hyperparathyroidism of renal origin: Secondary | ICD-10-CM | POA: Diagnosis not present

## 2017-04-20 DIAGNOSIS — N186 End stage renal disease: Secondary | ICD-10-CM | POA: Diagnosis not present

## 2017-04-22 DIAGNOSIS — N186 End stage renal disease: Secondary | ICD-10-CM | POA: Diagnosis not present

## 2017-04-22 DIAGNOSIS — D509 Iron deficiency anemia, unspecified: Secondary | ICD-10-CM | POA: Diagnosis not present

## 2017-04-22 DIAGNOSIS — N2581 Secondary hyperparathyroidism of renal origin: Secondary | ICD-10-CM | POA: Diagnosis not present

## 2017-04-25 DIAGNOSIS — N186 End stage renal disease: Secondary | ICD-10-CM | POA: Diagnosis not present

## 2017-04-25 DIAGNOSIS — D509 Iron deficiency anemia, unspecified: Secondary | ICD-10-CM | POA: Diagnosis not present

## 2017-04-25 DIAGNOSIS — N2581 Secondary hyperparathyroidism of renal origin: Secondary | ICD-10-CM | POA: Diagnosis not present

## 2017-04-27 DIAGNOSIS — N2581 Secondary hyperparathyroidism of renal origin: Secondary | ICD-10-CM | POA: Diagnosis not present

## 2017-04-27 DIAGNOSIS — N186 End stage renal disease: Secondary | ICD-10-CM | POA: Diagnosis not present

## 2017-04-27 DIAGNOSIS — D509 Iron deficiency anemia, unspecified: Secondary | ICD-10-CM | POA: Diagnosis not present

## 2017-04-29 DIAGNOSIS — N2581 Secondary hyperparathyroidism of renal origin: Secondary | ICD-10-CM | POA: Diagnosis not present

## 2017-04-29 DIAGNOSIS — N186 End stage renal disease: Secondary | ICD-10-CM | POA: Diagnosis not present

## 2017-04-29 DIAGNOSIS — D509 Iron deficiency anemia, unspecified: Secondary | ICD-10-CM | POA: Diagnosis not present

## 2017-05-02 DIAGNOSIS — D509 Iron deficiency anemia, unspecified: Secondary | ICD-10-CM | POA: Diagnosis not present

## 2017-05-02 DIAGNOSIS — N2581 Secondary hyperparathyroidism of renal origin: Secondary | ICD-10-CM | POA: Diagnosis not present

## 2017-05-02 DIAGNOSIS — N186 End stage renal disease: Secondary | ICD-10-CM | POA: Diagnosis not present

## 2017-05-03 ENCOUNTER — Ambulatory Visit
Admission: RE | Admit: 2017-05-03 | Discharge: 2017-05-03 | Disposition: A | Discharge status: Home / Self Care | Payer: Medicare Other | Source: Ambulatory Visit | Attending: Nephrology | Admitting: Nephrology

## 2017-05-03 DIAGNOSIS — Z1231 Encounter for screening mammogram for malignant neoplasm of breast: Secondary | ICD-10-CM

## 2017-05-04 DIAGNOSIS — D509 Iron deficiency anemia, unspecified: Secondary | ICD-10-CM | POA: Diagnosis not present

## 2017-05-04 DIAGNOSIS — N186 End stage renal disease: Secondary | ICD-10-CM | POA: Diagnosis not present

## 2017-05-04 DIAGNOSIS — N2581 Secondary hyperparathyroidism of renal origin: Secondary | ICD-10-CM | POA: Diagnosis not present

## 2017-05-06 DIAGNOSIS — N186 End stage renal disease: Secondary | ICD-10-CM | POA: Diagnosis not present

## 2017-05-06 DIAGNOSIS — N2581 Secondary hyperparathyroidism of renal origin: Secondary | ICD-10-CM | POA: Diagnosis not present

## 2017-05-06 DIAGNOSIS — D509 Iron deficiency anemia, unspecified: Secondary | ICD-10-CM | POA: Diagnosis not present

## 2017-05-09 DIAGNOSIS — N2581 Secondary hyperparathyroidism of renal origin: Secondary | ICD-10-CM | POA: Diagnosis not present

## 2017-05-09 DIAGNOSIS — D509 Iron deficiency anemia, unspecified: Secondary | ICD-10-CM | POA: Diagnosis not present

## 2017-05-09 DIAGNOSIS — N186 End stage renal disease: Secondary | ICD-10-CM | POA: Diagnosis not present

## 2017-05-10 DIAGNOSIS — N952 Postmenopausal atrophic vaginitis: Secondary | ICD-10-CM | POA: Diagnosis not present

## 2017-05-10 DIAGNOSIS — Z90710 Acquired absence of both cervix and uterus: Secondary | ICD-10-CM | POA: Diagnosis not present

## 2017-05-10 DIAGNOSIS — Z1272 Encounter for screening for malignant neoplasm of vagina: Secondary | ICD-10-CM | POA: Diagnosis not present

## 2017-05-10 DIAGNOSIS — Z124 Encounter for screening for malignant neoplasm of cervix: Secondary | ICD-10-CM | POA: Diagnosis not present

## 2017-05-10 DIAGNOSIS — N941 Unspecified dyspareunia: Secondary | ICD-10-CM | POA: Diagnosis not present

## 2017-05-10 DIAGNOSIS — Z6835 Body mass index (BMI) 35.0-35.9, adult: Secondary | ICD-10-CM | POA: Diagnosis not present

## 2017-05-11 DIAGNOSIS — N2581 Secondary hyperparathyroidism of renal origin: Secondary | ICD-10-CM | POA: Diagnosis not present

## 2017-05-11 DIAGNOSIS — D509 Iron deficiency anemia, unspecified: Secondary | ICD-10-CM | POA: Diagnosis not present

## 2017-05-11 DIAGNOSIS — N186 End stage renal disease: Secondary | ICD-10-CM | POA: Diagnosis not present

## 2017-05-13 DIAGNOSIS — D509 Iron deficiency anemia, unspecified: Secondary | ICD-10-CM | POA: Diagnosis not present

## 2017-05-13 DIAGNOSIS — N186 End stage renal disease: Secondary | ICD-10-CM | POA: Diagnosis not present

## 2017-05-13 DIAGNOSIS — N2581 Secondary hyperparathyroidism of renal origin: Secondary | ICD-10-CM | POA: Diagnosis not present

## 2017-05-16 DIAGNOSIS — D509 Iron deficiency anemia, unspecified: Secondary | ICD-10-CM | POA: Diagnosis not present

## 2017-05-16 DIAGNOSIS — N186 End stage renal disease: Secondary | ICD-10-CM | POA: Diagnosis not present

## 2017-05-16 DIAGNOSIS — N2581 Secondary hyperparathyroidism of renal origin: Secondary | ICD-10-CM | POA: Diagnosis not present

## 2017-05-18 DIAGNOSIS — D509 Iron deficiency anemia, unspecified: Secondary | ICD-10-CM | POA: Diagnosis not present

## 2017-05-18 DIAGNOSIS — N2581 Secondary hyperparathyroidism of renal origin: Secondary | ICD-10-CM | POA: Diagnosis not present

## 2017-05-18 DIAGNOSIS — N186 End stage renal disease: Secondary | ICD-10-CM | POA: Diagnosis not present

## 2017-05-20 DIAGNOSIS — Z992 Dependence on renal dialysis: Secondary | ICD-10-CM | POA: Diagnosis not present

## 2017-05-20 DIAGNOSIS — I120 Hypertensive chronic kidney disease with stage 5 chronic kidney disease or end stage renal disease: Secondary | ICD-10-CM | POA: Diagnosis not present

## 2017-05-20 DIAGNOSIS — D509 Iron deficiency anemia, unspecified: Secondary | ICD-10-CM | POA: Diagnosis not present

## 2017-05-20 DIAGNOSIS — N186 End stage renal disease: Secondary | ICD-10-CM | POA: Diagnosis not present

## 2017-05-20 DIAGNOSIS — N2581 Secondary hyperparathyroidism of renal origin: Secondary | ICD-10-CM | POA: Diagnosis not present

## 2017-05-23 DIAGNOSIS — N186 End stage renal disease: Secondary | ICD-10-CM | POA: Diagnosis not present

## 2017-05-23 DIAGNOSIS — D509 Iron deficiency anemia, unspecified: Secondary | ICD-10-CM | POA: Diagnosis not present

## 2017-05-23 DIAGNOSIS — N2581 Secondary hyperparathyroidism of renal origin: Secondary | ICD-10-CM | POA: Diagnosis not present

## 2017-05-23 DIAGNOSIS — Z23 Encounter for immunization: Secondary | ICD-10-CM | POA: Diagnosis not present

## 2017-05-25 DIAGNOSIS — N2581 Secondary hyperparathyroidism of renal origin: Secondary | ICD-10-CM | POA: Diagnosis not present

## 2017-05-25 DIAGNOSIS — N186 End stage renal disease: Secondary | ICD-10-CM | POA: Diagnosis not present

## 2017-05-25 DIAGNOSIS — D509 Iron deficiency anemia, unspecified: Secondary | ICD-10-CM | POA: Diagnosis not present

## 2017-05-25 DIAGNOSIS — Z23 Encounter for immunization: Secondary | ICD-10-CM | POA: Diagnosis not present

## 2017-05-27 DIAGNOSIS — N2581 Secondary hyperparathyroidism of renal origin: Secondary | ICD-10-CM | POA: Diagnosis not present

## 2017-05-27 DIAGNOSIS — N186 End stage renal disease: Secondary | ICD-10-CM | POA: Diagnosis not present

## 2017-05-27 DIAGNOSIS — Z23 Encounter for immunization: Secondary | ICD-10-CM | POA: Diagnosis not present

## 2017-05-27 DIAGNOSIS — D509 Iron deficiency anemia, unspecified: Secondary | ICD-10-CM | POA: Diagnosis not present

## 2017-05-30 DIAGNOSIS — N186 End stage renal disease: Secondary | ICD-10-CM | POA: Diagnosis not present

## 2017-05-30 DIAGNOSIS — N2581 Secondary hyperparathyroidism of renal origin: Secondary | ICD-10-CM | POA: Diagnosis not present

## 2017-05-30 DIAGNOSIS — Z23 Encounter for immunization: Secondary | ICD-10-CM | POA: Diagnosis not present

## 2017-05-30 DIAGNOSIS — D509 Iron deficiency anemia, unspecified: Secondary | ICD-10-CM | POA: Diagnosis not present

## 2017-06-01 DIAGNOSIS — N2581 Secondary hyperparathyroidism of renal origin: Secondary | ICD-10-CM | POA: Diagnosis not present

## 2017-06-01 DIAGNOSIS — D509 Iron deficiency anemia, unspecified: Secondary | ICD-10-CM | POA: Diagnosis not present

## 2017-06-01 DIAGNOSIS — N186 End stage renal disease: Secondary | ICD-10-CM | POA: Diagnosis not present

## 2017-06-01 DIAGNOSIS — Z23 Encounter for immunization: Secondary | ICD-10-CM | POA: Diagnosis not present

## 2017-06-03 DIAGNOSIS — Z23 Encounter for immunization: Secondary | ICD-10-CM | POA: Diagnosis not present

## 2017-06-03 DIAGNOSIS — N186 End stage renal disease: Secondary | ICD-10-CM | POA: Diagnosis not present

## 2017-06-03 DIAGNOSIS — D509 Iron deficiency anemia, unspecified: Secondary | ICD-10-CM | POA: Diagnosis not present

## 2017-06-03 DIAGNOSIS — N2581 Secondary hyperparathyroidism of renal origin: Secondary | ICD-10-CM | POA: Diagnosis not present

## 2017-06-06 DIAGNOSIS — D509 Iron deficiency anemia, unspecified: Secondary | ICD-10-CM | POA: Diagnosis not present

## 2017-06-06 DIAGNOSIS — N2581 Secondary hyperparathyroidism of renal origin: Secondary | ICD-10-CM | POA: Diagnosis not present

## 2017-06-06 DIAGNOSIS — N186 End stage renal disease: Secondary | ICD-10-CM | POA: Diagnosis not present

## 2017-06-06 DIAGNOSIS — Z23 Encounter for immunization: Secondary | ICD-10-CM | POA: Diagnosis not present

## 2017-06-08 DIAGNOSIS — D509 Iron deficiency anemia, unspecified: Secondary | ICD-10-CM | POA: Diagnosis not present

## 2017-06-08 DIAGNOSIS — N2581 Secondary hyperparathyroidism of renal origin: Secondary | ICD-10-CM | POA: Diagnosis not present

## 2017-06-08 DIAGNOSIS — N186 End stage renal disease: Secondary | ICD-10-CM | POA: Diagnosis not present

## 2017-06-08 DIAGNOSIS — Z23 Encounter for immunization: Secondary | ICD-10-CM | POA: Diagnosis not present

## 2017-06-10 DIAGNOSIS — N186 End stage renal disease: Secondary | ICD-10-CM | POA: Diagnosis not present

## 2017-06-10 DIAGNOSIS — Z23 Encounter for immunization: Secondary | ICD-10-CM | POA: Diagnosis not present

## 2017-06-10 DIAGNOSIS — N2581 Secondary hyperparathyroidism of renal origin: Secondary | ICD-10-CM | POA: Diagnosis not present

## 2017-06-10 DIAGNOSIS — D509 Iron deficiency anemia, unspecified: Secondary | ICD-10-CM | POA: Diagnosis not present

## 2017-06-13 DIAGNOSIS — N2581 Secondary hyperparathyroidism of renal origin: Secondary | ICD-10-CM | POA: Diagnosis not present

## 2017-06-13 DIAGNOSIS — Z23 Encounter for immunization: Secondary | ICD-10-CM | POA: Diagnosis not present

## 2017-06-13 DIAGNOSIS — D509 Iron deficiency anemia, unspecified: Secondary | ICD-10-CM | POA: Diagnosis not present

## 2017-06-13 DIAGNOSIS — N186 End stage renal disease: Secondary | ICD-10-CM | POA: Diagnosis not present

## 2017-06-15 DIAGNOSIS — D509 Iron deficiency anemia, unspecified: Secondary | ICD-10-CM | POA: Diagnosis not present

## 2017-06-15 DIAGNOSIS — Z23 Encounter for immunization: Secondary | ICD-10-CM | POA: Diagnosis not present

## 2017-06-15 DIAGNOSIS — N186 End stage renal disease: Secondary | ICD-10-CM | POA: Diagnosis not present

## 2017-06-15 DIAGNOSIS — N2581 Secondary hyperparathyroidism of renal origin: Secondary | ICD-10-CM | POA: Diagnosis not present

## 2017-06-17 DIAGNOSIS — N2581 Secondary hyperparathyroidism of renal origin: Secondary | ICD-10-CM | POA: Diagnosis not present

## 2017-06-17 DIAGNOSIS — Z23 Encounter for immunization: Secondary | ICD-10-CM | POA: Diagnosis not present

## 2017-06-17 DIAGNOSIS — D509 Iron deficiency anemia, unspecified: Secondary | ICD-10-CM | POA: Diagnosis not present

## 2017-06-17 DIAGNOSIS — N186 End stage renal disease: Secondary | ICD-10-CM | POA: Diagnosis not present

## 2017-06-19 DIAGNOSIS — N186 End stage renal disease: Secondary | ICD-10-CM | POA: Diagnosis not present

## 2017-06-19 DIAGNOSIS — Z992 Dependence on renal dialysis: Secondary | ICD-10-CM | POA: Diagnosis not present

## 2017-06-19 DIAGNOSIS — I120 Hypertensive chronic kidney disease with stage 5 chronic kidney disease or end stage renal disease: Secondary | ICD-10-CM | POA: Diagnosis not present

## 2017-06-20 DIAGNOSIS — D509 Iron deficiency anemia, unspecified: Secondary | ICD-10-CM | POA: Diagnosis not present

## 2017-06-20 DIAGNOSIS — N186 End stage renal disease: Secondary | ICD-10-CM | POA: Diagnosis not present

## 2017-06-20 DIAGNOSIS — N2581 Secondary hyperparathyroidism of renal origin: Secondary | ICD-10-CM | POA: Diagnosis not present

## 2017-06-22 DIAGNOSIS — N186 End stage renal disease: Secondary | ICD-10-CM | POA: Diagnosis not present

## 2017-06-22 DIAGNOSIS — D509 Iron deficiency anemia, unspecified: Secondary | ICD-10-CM | POA: Diagnosis not present

## 2017-06-22 DIAGNOSIS — N2581 Secondary hyperparathyroidism of renal origin: Secondary | ICD-10-CM | POA: Diagnosis not present

## 2017-06-24 DIAGNOSIS — N2581 Secondary hyperparathyroidism of renal origin: Secondary | ICD-10-CM | POA: Diagnosis not present

## 2017-06-24 DIAGNOSIS — N186 End stage renal disease: Secondary | ICD-10-CM | POA: Diagnosis not present

## 2017-06-24 DIAGNOSIS — D509 Iron deficiency anemia, unspecified: Secondary | ICD-10-CM | POA: Diagnosis not present

## 2017-06-27 DIAGNOSIS — N2581 Secondary hyperparathyroidism of renal origin: Secondary | ICD-10-CM | POA: Diagnosis not present

## 2017-06-27 DIAGNOSIS — D509 Iron deficiency anemia, unspecified: Secondary | ICD-10-CM | POA: Diagnosis not present

## 2017-06-27 DIAGNOSIS — N186 End stage renal disease: Secondary | ICD-10-CM | POA: Diagnosis not present

## 2017-06-29 DIAGNOSIS — N2581 Secondary hyperparathyroidism of renal origin: Secondary | ICD-10-CM | POA: Diagnosis not present

## 2017-06-29 DIAGNOSIS — D509 Iron deficiency anemia, unspecified: Secondary | ICD-10-CM | POA: Diagnosis not present

## 2017-06-29 DIAGNOSIS — N186 End stage renal disease: Secondary | ICD-10-CM | POA: Diagnosis not present

## 2017-07-01 DIAGNOSIS — D509 Iron deficiency anemia, unspecified: Secondary | ICD-10-CM | POA: Diagnosis not present

## 2017-07-01 DIAGNOSIS — N2581 Secondary hyperparathyroidism of renal origin: Secondary | ICD-10-CM | POA: Diagnosis not present

## 2017-07-01 DIAGNOSIS — N186 End stage renal disease: Secondary | ICD-10-CM | POA: Diagnosis not present

## 2017-07-04 DIAGNOSIS — D509 Iron deficiency anemia, unspecified: Secondary | ICD-10-CM | POA: Diagnosis not present

## 2017-07-04 DIAGNOSIS — N186 End stage renal disease: Secondary | ICD-10-CM | POA: Diagnosis not present

## 2017-07-04 DIAGNOSIS — N2581 Secondary hyperparathyroidism of renal origin: Secondary | ICD-10-CM | POA: Diagnosis not present

## 2017-07-06 DIAGNOSIS — N2581 Secondary hyperparathyroidism of renal origin: Secondary | ICD-10-CM | POA: Diagnosis not present

## 2017-07-06 DIAGNOSIS — N186 End stage renal disease: Secondary | ICD-10-CM | POA: Diagnosis not present

## 2017-07-06 DIAGNOSIS — D509 Iron deficiency anemia, unspecified: Secondary | ICD-10-CM | POA: Diagnosis not present

## 2017-07-08 DIAGNOSIS — N186 End stage renal disease: Secondary | ICD-10-CM | POA: Diagnosis not present

## 2017-07-08 DIAGNOSIS — D509 Iron deficiency anemia, unspecified: Secondary | ICD-10-CM | POA: Diagnosis not present

## 2017-07-08 DIAGNOSIS — N2581 Secondary hyperparathyroidism of renal origin: Secondary | ICD-10-CM | POA: Diagnosis not present

## 2017-07-11 DIAGNOSIS — Z992 Dependence on renal dialysis: Secondary | ICD-10-CM | POA: Diagnosis not present

## 2017-07-11 DIAGNOSIS — I871 Compression of vein: Secondary | ICD-10-CM | POA: Diagnosis not present

## 2017-07-11 DIAGNOSIS — T82868A Thrombosis of vascular prosthetic devices, implants and grafts, initial encounter: Secondary | ICD-10-CM | POA: Diagnosis not present

## 2017-07-11 DIAGNOSIS — N186 End stage renal disease: Secondary | ICD-10-CM | POA: Diagnosis not present

## 2017-07-12 DIAGNOSIS — N2581 Secondary hyperparathyroidism of renal origin: Secondary | ICD-10-CM | POA: Diagnosis not present

## 2017-07-12 DIAGNOSIS — D509 Iron deficiency anemia, unspecified: Secondary | ICD-10-CM | POA: Diagnosis not present

## 2017-07-12 DIAGNOSIS — N186 End stage renal disease: Secondary | ICD-10-CM | POA: Diagnosis not present

## 2017-07-13 DIAGNOSIS — D509 Iron deficiency anemia, unspecified: Secondary | ICD-10-CM | POA: Diagnosis not present

## 2017-07-13 DIAGNOSIS — N186 End stage renal disease: Secondary | ICD-10-CM | POA: Diagnosis not present

## 2017-07-13 DIAGNOSIS — N2581 Secondary hyperparathyroidism of renal origin: Secondary | ICD-10-CM | POA: Diagnosis not present

## 2017-07-15 DIAGNOSIS — N2581 Secondary hyperparathyroidism of renal origin: Secondary | ICD-10-CM | POA: Diagnosis not present

## 2017-07-15 DIAGNOSIS — N186 End stage renal disease: Secondary | ICD-10-CM | POA: Diagnosis not present

## 2017-07-15 DIAGNOSIS — D509 Iron deficiency anemia, unspecified: Secondary | ICD-10-CM | POA: Diagnosis not present

## 2017-07-18 DIAGNOSIS — D509 Iron deficiency anemia, unspecified: Secondary | ICD-10-CM | POA: Diagnosis not present

## 2017-07-18 DIAGNOSIS — N186 End stage renal disease: Secondary | ICD-10-CM | POA: Diagnosis not present

## 2017-07-18 DIAGNOSIS — N2581 Secondary hyperparathyroidism of renal origin: Secondary | ICD-10-CM | POA: Diagnosis not present

## 2017-07-19 ENCOUNTER — Ambulatory Visit (INDEPENDENT_AMBULATORY_CARE_PROVIDER_SITE_OTHER): Payer: Medicare Other | Admitting: Interventional Cardiology

## 2017-07-19 ENCOUNTER — Encounter: Payer: Self-pay | Admitting: Interventional Cardiology

## 2017-07-19 VITALS — BP 84/62 | HR 90 | Ht 63.5 in | Wt 193.6 lb

## 2017-07-19 DIAGNOSIS — R0989 Other specified symptoms and signs involving the circulatory and respiratory systems: Secondary | ICD-10-CM

## 2017-07-19 DIAGNOSIS — N186 End stage renal disease: Secondary | ICD-10-CM | POA: Diagnosis not present

## 2017-07-19 DIAGNOSIS — I959 Hypotension, unspecified: Secondary | ICD-10-CM | POA: Diagnosis not present

## 2017-07-19 DIAGNOSIS — R9431 Abnormal electrocardiogram [ECG] [EKG]: Secondary | ICD-10-CM

## 2017-07-19 DIAGNOSIS — I10 Essential (primary) hypertension: Secondary | ICD-10-CM | POA: Diagnosis not present

## 2017-07-19 NOTE — Progress Notes (Addendum)
Cardiology Office Note    Date:  07/19/2017   ID:  Victoria [name], DOB 03-Nov-1955, MRN [id number]  PCP:  Mauricia Area, MD  Cardiologist: Sinclair Grooms, MD   Chief Complaint  Patient presents with  . Advice Only    Low blood pressure    History of Present Illness:  Victoria [name] is a 61 y.o. female referred by the [facility], Dr. Jeneen Rinks Deterding, for evaluation of recent low blood pressure.  Prior history of abnormal myocardial perfusion scans. History of chronic end-stage kidney disease.  The patient voices no cardiac complaints.  She is referred because of low blood pressures that have been noted less than 90 mmHg.  She denies fainting, recurrent dizziness, and syncope.  She denies orthopnea.  She does not have chest discomfort with activity.  She has leg pain with activity but correlates this with prior history of knee arthritis and patella fracture.  She denies edema.  There is no orthopnea or PND.  She does have a history of CVA.   Past Medical History:  Diagnosis Date  . Chronic bronchitis ([place])   . Complication of anesthesia ~ 2011   "they gave me a medicine that swolled me and mouth burning up" (08/23/2012)  . Complication of anesthesia    slow to wake up  . ESRD (end stage renal disease) on dialysis [facility]) Nephrologist-- dr deterding   ESRD due to HTN-; "M/W/F; [place]" (11/28/2015  . GERD (gastroesophageal reflux disease)   . History of acute pulmonary edema    2003  . Hypertension    no longer on medications  . Left patella fracture   . Pneumonia    as a child  . Seasonal allergies   . Secondary hyperparathyroidism, renal ([place])    s/p  total parathyroidectomy  2014  . Thyroid disease   . Wears glasses     Past Surgical History:  Procedure Laterality Date  . ANKLE FRACTURE SURGERY Right ~ 2005   "has pins in it"  . APPLICATION OF WOUND VAC Left 11/28/2015   knee  . APPLICATION OF WOUND VAC Left 11/28/2015   Procedure: APPLICATION  OF WOUND VAC;  Surgeon: Rod Can, MD;  Location: [place];  Service: Orthopedics;  Laterality: Left;  . ARTERIOVENOUS GRAFT PLACEMENT  03-25-2005   Right forearm  w/  multiple Revision's   . CARDIOVASCULAR STRESS TEST  08-24-2012   abnormal nuclear study/  inferolateral and anteroseptal areas of scar,  no ischemia/  normal LV function and wall motion, ef 77%  . DIALYSIS FISTULA CREATION  1992   left upper arm ---  w/  Multiple Revision's until 2006  . ECTOPIC PREGNANCY SURGERY  1970's  . FRACTURE SURGERY    . I&D EXTREMITY Left 11/28/2015   Procedure: IRRIGATION AND DEBRIDEMENT LEFT KNEE WOUND ;  Surgeon: Rod Can, MD;  Location: [place];  Service: Orthopedics;  Laterality: Left;  . INCISION AND DRAINAGE OF WOUND Left 11/28/2015   knee  . ORIF PATELLA Left 10/31/2015   Procedure: OPEN REDUCTION INTERNAL (ORIF) FIXATION LEFT PATELLA;  Surgeon: Rod Can, MD;  Location: [place];  Service: Orthopedics;  Laterality: Left;  . PATELLAR TENDON REPAIR  10/03/2012   Procedure: PATELLA TENDON REPAIR;  Surgeon: Marin Shutter, MD;  Location: [place];  Service: Orthopedics;  Laterality: Right;  . PATELLECTOMY  10/03/2012   Procedure: PATELLECTOMY;  Surgeon: Marin Shutter, MD;  Location: [place];  Service: Orthopedics;  Laterality: Right;  RIGHT PARTIAL PATELLECTOMY AND PATELLA TENDON REPAIR  . REVISION OF ARTERIOVENOUS GORETEX GRAFT  09/27/2012   Procedure: REVISION OF ARTERIOVENOUS GORETEX GRAFT;  Surgeon: Mal Misty, MD;  Location: [place];  Service: Vascular;  Laterality: Right;  1) Replacement of venous half of loop with 39mm Gortex graft  2) Excision of erroded pseudoaneurysm of graft with primary closure.  Marland Kitchen REVISION OF ARTERIOVENOUS GORETEX GRAFT Right 01/23/2013   Procedure: REVISION OF ARTERIOVENOUS GORETEX GRAFT;  Surgeon: Conrad Shamokin, MD;  Location: [facility] OR;  Service: Vascular;  Laterality: Right;  Using piece of 62mm x 20cm Gortex graft.   Marland Kitchen REVISION OF ARTERIOVENOUS GORETEX  GRAFT Right 10/07/2015   Procedure: REVISION OF Right arm ARTERIOVENOUS GORETEX GRAFT;  Surgeon: Elam Dutch, MD;  Location: [facility] OR;  Service: Vascular;  Laterality: Right;  . REVISION OF ARTERIOVENOUS GORETEX GRAFT Right 02/17/2016   Procedure: REVISION OF ARTERIOVENOUS GORETEX GRAFT;  Surgeon: Elam Dutch, MD;  Location: [place];  Service: Vascular;  Laterality: Right;  . TOTAL ABDOMINAL HYSTERECTOMY  03-05-2005   w/  Right Ovarian Cystectomy  . TOTAL PARATHYROIDECTOMY/  THYROID ISTHMUSECTOMY/  AUTOTRANSPLANTATION PARATHYROID TISSUE TO LEFT BRACHIORIADIALIS MUSCLE  02-18-2011  . TRANSTHORACIC ECHOCARDIOGRAM  10-31-2012   mild LVH,  grade 1 diastolic dysfunction,  ef 55-65%/  mild MR/  trivial TR    Current Medications: Outpatient Medications Prior to Visit  Medication Sig Dispense Refill  . calcium acetate (PHOSLO) 667 MG capsule Take 2,001 mg by mouth 3 (three) times daily with meals.     Marland Kitchen aspirin 81 MG tablet Take 1 tablet (81 mg total) by mouth 2 (two) times daily after a meal. Reported on 10/02/2015 (Patient not taking: Reported on 02/19/2017) 60 tablet 0  . cephALEXin (KEFLEX) 500 MG capsule Take 1 capsule (500 mg total) by mouth 4 (four) times daily. (Patient not taking: Reported on 02/19/2017) 20 capsule 0   No facility-administered medications prior to visit.      Allergies:   Amlodipine and Lisinopril   Social History   Social History  . Marital status: Married    Spouse name: N/A  . Number of children: N/A  . Years of education: N/A   Social History Main Topics  . Smoking status: Never Smoker  . Smokeless tobacco: Never Used  . Alcohol use No     Comment: quit 2007  . Drug use: No  . Sexual activity: Yes   Other Topics Concern  . None   Social History Narrative  . None     Family History:  The patient's family history includes Healthy in her mother; Hypertension in her father and sister; Kidney failure in her father; Thyroid disease in her sister.   ROS:     Please see the history of present illness.    Patient voices no specific complaints.  She is here to comply with her physician's request. All other systems reviewed and are negative.   PHYSICAL EXAM:   VS:  BP (!) 84/62 (BP Location: Left Arm)   Pulse 90   Ht 5' 3.5" (1.613 m)   Wt 193 lb 9.6 oz (87.8 kg)   BMI 33.76 kg/m    GEN: Well nourished, well developed, in no acute distress  HEENT: normal  Neck: no JVD, carotid bruits, or masses Cardiac: RRR; there is a soft right upper sternal border systolic murmur but no rubs, or gallops,no edema  Respiratory:  clear to auscultation bilaterally, normal work of breathing  GI: soft, nontender, nondistended, + BS MS: no deformity or atrophy.   Unable to palpate pulses. Skin: warm and dry, no rash Neuro:  Alert and Oriented x 3, Strength and sensation are intact Psych: euthymic mood, full affect  Wt Readings from Last 3 Encounters:  07/19/17 193 lb 9.6 oz (87.8 kg)  02/17/16 189 lb (85.7 kg)  01/28/16 189 lb (85.7 kg)      Studies/Labs Reviewed:   EKG:  EKG normal sinus rhythm, inferior infarct pattern, anterolateral infarct pattern, first-degree AV block, and when compared to prior tracings no changes occurred when compared to May 2017.  Recent Labs: 02/19/2017: BUN 68; Creatinine, Ser 14.89; Hemoglobin 15.3; Platelets 154; Potassium 4.2; Sodium 135   Lipid Panel    Component Value Date/Time   CHOL 140 11/16/2012 0845   TRIG 82.0 11/16/2012 0845   HDL 44.60 11/16/2012 0845   CHOLHDL 3 11/16/2012 0845   VLDL 16.4 11/16/2012 0845   LDLCALC 79 11/16/2012 0845    Additional studies/ records that were reviewed today include:  2D Doppler echocardiogram performed in February 2014: Study Conclusions  - Left ventricle: The cavity size was normal. Wall thickness was increased in a pattern of mild LVH. Systolic function was normal. The estimated ejection fraction was in the range of 55% to 65%. Wall motion was normal; there  were no regional wall motion abnormalities. Doppler parameters are consistent with abnormal left ventricular relaxation (grade 1 diastolic dysfunction). - Mitral valve: Mild regurgitation. - Atrial septum: No defect or patent foramen ovale was identified.   ASSESSMENT:    1. Hypotension, unspecified hypotension type   2. Essential hypertension   3. End stage renal disease ([place])   4. Bilateral non-palpable femoral pulses   5. Abnormal EKG      PLAN:  In order of problems listed above:  1. Etiology low blood pressure is not clear.  Could have bilateral subclavian obstruction versus decreased registered blood pressures due to arteriovenous graft for dialysis.  We will start by ordering a bilateral lower extremity arterial duplex and ABI.  If lower extremity pressures are normal this would then infer that there is upper extremity disease.  If the lower extremity pressures are low, this would suggest true hypotension.  We will also perform a 2D Doppler echocardiogram to exclude systolic dysfunction and or severe pulmonary hypertension.  I do not hear a murmur of aortic stenosis. 2. Palpable blood pressure is 90 mmHg in the left arm. 3. Patient has been on dialysis related to hypertension for greater than 30 years.  She does not have palpable pulses in her feet. 4. Arterial Doppler study disease. 5. The EKG abnormalities or chronic and suggest prior inferior and anterolateral infarct.  Echocardiography will exclude wall motion  Clinical follow-up 1 month.    Medication Adjustments/Labs and Tests Ordered: Current medicines are reviewed at length with the patient today.  Concerns regarding medicines are outlined above.  Medication changes, Labs and Tests ordered today are listed in the Patient Instructions below. There are no Patient Instructions on file for this visit.   Signed, Sinclair Grooms, MD  07/19/2017 1:55 PM    [facility] [address],  [place], [place]  [address] Phone: [phone]; Fax: [phone]

## 2017-07-19 NOTE — Patient Instructions (Signed)
Medication Instructions:  Your physician recommends that you continue on your current medications as directed. Please refer to the Current Medication list given to you today.  Labwork: None ordered   Testing/Procedures: Your physician has requested that you have a lower extremity arterial duplex. This test is an ultrasound of the arteries in the legs. It looks at arterial blood flow in the legs. Allow one hour for Lower  Arterial scans. There are no restrictions or special instructions  Your physician has requested that you have an echocardiogram. Echocardiography is a painless test that uses sound waves to create images of your heart. It provides your doctor with information about the size and shape of your heart and how well your heart's chambers and valves are working. This procedure takes approximately one hour. There are no restrictions for this procedure.   Follow-Up: Your physician recommends that you schedule a follow-up appointment in: 1 month with Dr. Tamala Julian (can have 12/03 at 11:40am)   Any Other Special Instructions Will Be Listed Below (If Applicable).     If you need a refill on your cardiac medications before your next appointment, please call your pharmacy.

## 2017-07-20 ENCOUNTER — Other Ambulatory Visit: Payer: Self-pay | Admitting: Interventional Cardiology

## 2017-07-20 DIAGNOSIS — I120 Hypertensive chronic kidney disease with stage 5 chronic kidney disease or end stage renal disease: Secondary | ICD-10-CM | POA: Diagnosis not present

## 2017-07-20 DIAGNOSIS — R0989 Other specified symptoms and signs involving the circulatory and respiratory systems: Secondary | ICD-10-CM

## 2017-07-20 DIAGNOSIS — N186 End stage renal disease: Secondary | ICD-10-CM | POA: Diagnosis not present

## 2017-07-20 DIAGNOSIS — D509 Iron deficiency anemia, unspecified: Secondary | ICD-10-CM | POA: Diagnosis not present

## 2017-07-20 DIAGNOSIS — Z992 Dependence on renal dialysis: Secondary | ICD-10-CM | POA: Diagnosis not present

## 2017-07-20 DIAGNOSIS — N2581 Secondary hyperparathyroidism of renal origin: Secondary | ICD-10-CM | POA: Diagnosis not present

## 2017-07-21 ENCOUNTER — Other Ambulatory Visit: Payer: Self-pay

## 2017-07-21 ENCOUNTER — Ambulatory Visit (HOSPITAL_COMMUNITY): Payer: Medicare Other | Attending: Cardiovascular Disease

## 2017-07-21 DIAGNOSIS — I5030 Unspecified diastolic (congestive) heart failure: Secondary | ICD-10-CM | POA: Insufficient documentation

## 2017-07-21 DIAGNOSIS — I959 Hypotension, unspecified: Secondary | ICD-10-CM | POA: Diagnosis not present

## 2017-07-21 DIAGNOSIS — I10 Essential (primary) hypertension: Secondary | ICD-10-CM

## 2017-07-22 DIAGNOSIS — N2581 Secondary hyperparathyroidism of renal origin: Secondary | ICD-10-CM | POA: Diagnosis not present

## 2017-07-22 DIAGNOSIS — N186 End stage renal disease: Secondary | ICD-10-CM | POA: Diagnosis not present

## 2017-07-22 DIAGNOSIS — D509 Iron deficiency anemia, unspecified: Secondary | ICD-10-CM | POA: Diagnosis not present

## 2017-07-25 DIAGNOSIS — N2581 Secondary hyperparathyroidism of renal origin: Secondary | ICD-10-CM | POA: Diagnosis not present

## 2017-07-25 DIAGNOSIS — D509 Iron deficiency anemia, unspecified: Secondary | ICD-10-CM | POA: Diagnosis not present

## 2017-07-25 DIAGNOSIS — N186 End stage renal disease: Secondary | ICD-10-CM | POA: Diagnosis not present

## 2017-07-27 DIAGNOSIS — N186 End stage renal disease: Secondary | ICD-10-CM | POA: Diagnosis not present

## 2017-07-27 DIAGNOSIS — N2581 Secondary hyperparathyroidism of renal origin: Secondary | ICD-10-CM | POA: Diagnosis not present

## 2017-07-27 DIAGNOSIS — D509 Iron deficiency anemia, unspecified: Secondary | ICD-10-CM | POA: Diagnosis not present

## 2017-07-29 DIAGNOSIS — D509 Iron deficiency anemia, unspecified: Secondary | ICD-10-CM | POA: Diagnosis not present

## 2017-07-29 DIAGNOSIS — N2581 Secondary hyperparathyroidism of renal origin: Secondary | ICD-10-CM | POA: Diagnosis not present

## 2017-07-29 DIAGNOSIS — N186 End stage renal disease: Secondary | ICD-10-CM | POA: Diagnosis not present

## 2017-08-02 DIAGNOSIS — N2581 Secondary hyperparathyroidism of renal origin: Secondary | ICD-10-CM | POA: Diagnosis not present

## 2017-08-02 DIAGNOSIS — N186 End stage renal disease: Secondary | ICD-10-CM | POA: Diagnosis not present

## 2017-08-02 DIAGNOSIS — D509 Iron deficiency anemia, unspecified: Secondary | ICD-10-CM | POA: Diagnosis not present

## 2017-08-03 DIAGNOSIS — N2581 Secondary hyperparathyroidism of renal origin: Secondary | ICD-10-CM | POA: Diagnosis not present

## 2017-08-03 DIAGNOSIS — N186 End stage renal disease: Secondary | ICD-10-CM | POA: Diagnosis not present

## 2017-08-03 DIAGNOSIS — D509 Iron deficiency anemia, unspecified: Secondary | ICD-10-CM | POA: Diagnosis not present

## 2017-08-05 DIAGNOSIS — D509 Iron deficiency anemia, unspecified: Secondary | ICD-10-CM | POA: Diagnosis not present

## 2017-08-05 DIAGNOSIS — N2581 Secondary hyperparathyroidism of renal origin: Secondary | ICD-10-CM | POA: Diagnosis not present

## 2017-08-05 DIAGNOSIS — N186 End stage renal disease: Secondary | ICD-10-CM | POA: Diagnosis not present

## 2017-08-07 DIAGNOSIS — N2581 Secondary hyperparathyroidism of renal origin: Secondary | ICD-10-CM | POA: Diagnosis not present

## 2017-08-07 DIAGNOSIS — N186 End stage renal disease: Secondary | ICD-10-CM | POA: Diagnosis not present

## 2017-08-07 DIAGNOSIS — D509 Iron deficiency anemia, unspecified: Secondary | ICD-10-CM | POA: Diagnosis not present

## 2017-08-09 ENCOUNTER — Ambulatory Visit (HOSPITAL_COMMUNITY)
Admission: RE | Admit: 2017-08-09 | Discharge: 2017-08-09 | Disposition: A | Discharge status: Home / Self Care | Payer: Medicare Other | Source: Ambulatory Visit | Attending: Cardiology | Admitting: Cardiology

## 2017-08-09 DIAGNOSIS — N186 End stage renal disease: Secondary | ICD-10-CM | POA: Diagnosis not present

## 2017-08-09 DIAGNOSIS — D509 Iron deficiency anemia, unspecified: Secondary | ICD-10-CM | POA: Diagnosis not present

## 2017-08-09 DIAGNOSIS — N2581 Secondary hyperparathyroidism of renal origin: Secondary | ICD-10-CM | POA: Diagnosis not present

## 2017-08-09 DIAGNOSIS — R0989 Other specified symptoms and signs involving the circulatory and respiratory systems: Secondary | ICD-10-CM | POA: Diagnosis not present

## 2017-08-10 ENCOUNTER — Encounter: Payer: Self-pay | Admitting: Interventional Cardiology

## 2017-08-12 DIAGNOSIS — N186 End stage renal disease: Secondary | ICD-10-CM | POA: Diagnosis not present

## 2017-08-12 DIAGNOSIS — N2581 Secondary hyperparathyroidism of renal origin: Secondary | ICD-10-CM | POA: Diagnosis not present

## 2017-08-12 DIAGNOSIS — D509 Iron deficiency anemia, unspecified: Secondary | ICD-10-CM | POA: Diagnosis not present

## 2017-08-15 DIAGNOSIS — N2581 Secondary hyperparathyroidism of renal origin: Secondary | ICD-10-CM | POA: Diagnosis not present

## 2017-08-15 DIAGNOSIS — D509 Iron deficiency anemia, unspecified: Secondary | ICD-10-CM | POA: Diagnosis not present

## 2017-08-15 DIAGNOSIS — N186 End stage renal disease: Secondary | ICD-10-CM | POA: Diagnosis not present

## 2017-08-17 DIAGNOSIS — N2581 Secondary hyperparathyroidism of renal origin: Secondary | ICD-10-CM | POA: Diagnosis not present

## 2017-08-17 DIAGNOSIS — D509 Iron deficiency anemia, unspecified: Secondary | ICD-10-CM | POA: Diagnosis not present

## 2017-08-17 DIAGNOSIS — N186 End stage renal disease: Secondary | ICD-10-CM | POA: Diagnosis not present

## 2017-08-19 DIAGNOSIS — I120 Hypertensive chronic kidney disease with stage 5 chronic kidney disease or end stage renal disease: Secondary | ICD-10-CM | POA: Diagnosis not present

## 2017-08-19 DIAGNOSIS — Z992 Dependence on renal dialysis: Secondary | ICD-10-CM | POA: Diagnosis not present

## 2017-08-19 DIAGNOSIS — N186 End stage renal disease: Secondary | ICD-10-CM | POA: Diagnosis not present

## 2017-08-19 DIAGNOSIS — D509 Iron deficiency anemia, unspecified: Secondary | ICD-10-CM | POA: Diagnosis not present

## 2017-08-19 DIAGNOSIS — N2581 Secondary hyperparathyroidism of renal origin: Secondary | ICD-10-CM | POA: Diagnosis not present

## 2017-08-21 NOTE — Progress Notes (Signed)
Cardiology Office Note    Date:  08/22/2017   ID:  Victoria [name], DOB 12-Aug-1956, MRN [id number]  PCP:  Mauricia Area, MD  Cardiologist: Sinclair Grooms, MD   Chief Complaint  Patient presents with  . Follow-up    Low blood pressure both arms    History of Present Illness:  Victoria [name] is a 61 y.o. female referred by the [facility], Dr. Jeneen Rinks Deterding, for evaluation of recent low blood pressure.  Prior history of abnormal myocardial perfusion scans. History of chronic end-stage kidney disease.   She has no symptoms to suggest angina. She denies dyspnea. The reason for consultation was to determine why upper extremity blood pressures are low. It is difficult to monitor her blood pressure during dialysis.    Past Medical History:  Diagnosis Date  . Chronic bronchitis ([place])   . Complication of anesthesia ~ 2011   "they gave me a medicine that swolled me and mouth burning up" (08/23/2012)  . Complication of anesthesia    slow to wake up  . ESRD (end stage renal disease) on dialysis [facility]) Nephrologist-- dr deterding   ESRD due to HTN-; "M/W/F; [place]" (11/28/2015  . GERD (gastroesophageal reflux disease)   . History of acute pulmonary edema    2003  . Hypertension    no longer on medications  . Left patella fracture   . Pneumonia    as a child  . Seasonal allergies   . Secondary hyperparathyroidism, renal ([place])    s/p  total parathyroidectomy  2014  . Thyroid disease   . Wears glasses     Past Surgical History:  Procedure Laterality Date  . ANKLE FRACTURE SURGERY Right ~ 2005   "has pins in it"  . APPLICATION OF WOUND VAC Left 11/28/2015   knee  . APPLICATION OF WOUND VAC Left 11/28/2015   Procedure: APPLICATION OF WOUND VAC;  Surgeon: Rod Can, MD;  Location: [place];  Service: Orthopedics;  Laterality: Left;  . ARTERIOVENOUS GRAFT PLACEMENT  03-25-2005   Right forearm  w/  multiple Revision's   . CARDIOVASCULAR STRESS TEST   08-24-2012   abnormal nuclear study/  inferolateral and anteroseptal areas of scar,  no ischemia/  normal LV function and wall motion, ef 77%  . DIALYSIS FISTULA CREATION  1992   left upper arm ---  w/  Multiple Revision's until 2006  . ECTOPIC PREGNANCY SURGERY  1970's  . FRACTURE SURGERY    . I&D EXTREMITY Left 11/28/2015   Procedure: IRRIGATION AND DEBRIDEMENT LEFT KNEE WOUND ;  Surgeon: Rod Can, MD;  Location: [place];  Service: Orthopedics;  Laterality: Left;  . INCISION AND DRAINAGE OF WOUND Left 11/28/2015   knee  . ORIF PATELLA Left 10/31/2015   Procedure: OPEN REDUCTION INTERNAL (ORIF) FIXATION LEFT PATELLA;  Surgeon: Rod Can, MD;  Location: [place];  Service: Orthopedics;  Laterality: Left;  . PATELLAR TENDON REPAIR  10/03/2012   Procedure: PATELLA TENDON REPAIR;  Surgeon: Marin Shutter, MD;  Location: [place];  Service: Orthopedics;  Laterality: Right;  . PATELLECTOMY  10/03/2012   Procedure: PATELLECTOMY;  Surgeon: Marin Shutter, MD;  Location: [place];  Service: Orthopedics;  Laterality: Right;  RIGHT PARTIAL PATELLECTOMY AND PATELLA TENDON REPAIR  . REVISION OF ARTERIOVENOUS GORETEX GRAFT  09/27/2012   Procedure: REVISION OF ARTERIOVENOUS GORETEX GRAFT;  Surgeon: Mal Misty, MD;  Location: [place];  Service: Vascular;  Laterality: Right;  1)  Replacement of venous half of loop with 73mm Gortex graft  2) Excision of erroded pseudoaneurysm of graft with primary closure.  Marland Kitchen REVISION OF ARTERIOVENOUS GORETEX GRAFT Right 01/23/2013   Procedure: REVISION OF ARTERIOVENOUS GORETEX GRAFT;  Surgeon: Conrad Pushmataha, MD;  Location: [facility] OR;  Service: Vascular;  Laterality: Right;  Using piece of 71mm x 20cm Gortex graft.   Marland Kitchen REVISION OF ARTERIOVENOUS GORETEX GRAFT Right 10/07/2015   Procedure: REVISION OF Right arm ARTERIOVENOUS GORETEX GRAFT;  Surgeon: Elam Dutch, MD;  Location: [facility] OR;  Service: Vascular;  Laterality: Right;  . REVISION OF ARTERIOVENOUS GORETEX GRAFT  Right 02/17/2016   Procedure: REVISION OF ARTERIOVENOUS GORETEX GRAFT;  Surgeon: Elam Dutch, MD;  Location: [place];  Service: Vascular;  Laterality: Right;  . TOTAL ABDOMINAL HYSTERECTOMY  03-05-2005   w/  Right Ovarian Cystectomy  . TOTAL PARATHYROIDECTOMY/  THYROID ISTHMUSECTOMY/  AUTOTRANSPLANTATION PARATHYROID TISSUE TO LEFT BRACHIORIADIALIS MUSCLE  02-18-2011  . TRANSTHORACIC ECHOCARDIOGRAM  10-31-2012   mild LVH,  grade 1 diastolic dysfunction,  ef 55-65%/  mild MR/  trivial TR    Current Medications: Outpatient Medications Prior to Visit  Medication Sig Dispense Refill  . aspirin 81 MG chewable tablet Chew 81 mg by mouth daily.    . calcium acetate (PHOSLO) 667 MG capsule Take 2,001 mg by mouth 3 (three) times daily with meals.      No facility-administered medications prior to visit.      Allergies:   Amlodipine and Lisinopril   Social History   Socioeconomic History  . Marital status: Married    Spouse name: None  . Number of children: None  . Years of education: None  . Highest education level: None  Social Needs  . Financial resource strain: None  . Food insecurity - worry: None  . Food insecurity - inability: None  . Transportation needs - medical: None  . Transportation needs - non-medical: None  Occupational History  . None  Tobacco Use  . Smoking status: Never Smoker  . Smokeless tobacco: Never Used  Substance and Sexual Activity  . Alcohol use: No    Alcohol/week: 0.0 oz    Comment: quit 2007  . Drug use: No  . Sexual activity: Yes  Other Topics Concern  . None  Social History Narrative  . None     Family History:  The patient's family history includes Healthy in her mother; Hypertension in her father and sister; Kidney failure in her father; Thyroid disease in her sister.   ROS:   Please see the history of present illness.    Denies lightheadedness and fainting. Does state that her pressures get low while on dialysis.  All other systems  reviewed and are negative.   PHYSICAL EXAM:   VS:  BP 92/60   Pulse 99   Ht 5' 3.5" (1.613 m)   Wt 193 lb 12.8 oz (87.9 kg)   BMI 33.79 kg/m    GEN: Well nourished, well developed, in no acute distress  HEENT: normal  Neck: no JVD, carotid bruits, or masses Cardiac: RRR; no murmurs, rubs, or gallops,no edema  Respiratory:  clear to auscultation bilaterally, normal work of breathing GI: soft, nontender, nondistended, + BS MS: no deformity or atrophy  Skin: warm and dry, no rash Neuro:  Alert and Oriented x 3, Strength and sensation are intact Psych: euthymic mood, full affect  Wt Readings from Last 3 Encounters:  08/22/17 193 lb 12.8 oz (87.9 kg)  07/19/17 193  lb 9.6 oz (87.8 kg)  02/17/16 189 lb (85.7 kg)      Studies/Labs Reviewed:   EKG:  EKG  not repeated  Recent Labs: 02/19/2017: BUN 68; Creatinine, Ser 14.89; Hemoglobin 15.3; Platelets 154; Potassium 4.2; Sodium 135   Lipid Panel    Component Value Date/Time   CHOL 140 11/16/2012 0845   TRIG 82.0 11/16/2012 0845   HDL 44.60 11/16/2012 0845   CHOLHDL 3 11/16/2012 0845   VLDL 16.4 11/16/2012 0845   LDLCALC 79 11/16/2012 0845    Additional studies/ records that were reviewed today include:  Echocardiogram 07/21/2017:  Study Conclusions   - Left ventricle: The cavity size was normal. Wall thickness was   increased in a pattern of mild LVH. Systolic function was normal.   The estimated ejection fraction was in the range of 55% to 60%.   Doppler parameters are consistent with abnormal left ventricular   relaxation (grade 1 diastolic dysfunction). - Atrial septum: No defect or patent foramen ovale was identified.  Bilateral lower extremity arterial brachial index performed 08/09/2017: ABI Findings: +---------+------------------+-----+---------+--------+ Right    Rt Pressure (mmHg)IndexWaveform Comment  +---------+------------------+-----+---------+--------+ Brachial AVF                                       +---------+------------------+-----+---------+--------+ CFA                             triphasic         +---------+------------------+-----+---------+--------+ Popliteal                       triphasic         +---------+------------------+-----+---------+--------+ ATA      122               1.21 triphasic         +---------+------------------+-----+---------+--------+ PTA      104               1.03 triphasic         +---------+------------------+-----+---------+--------+ PERO     119               1.18 triphasic         +---------+------------------+-----+---------+--------+ Great Toe108               1.07                   +---------+------------------+-----+---------+--------+  +---------+------------------+-----+---------+-------+ Left     Lt Pressure (mmHg)IndexWaveform Comment +---------+------------------+-----+---------+-------+ Brachial 101                                     +---------+------------------+-----+---------+-------+ CFA                             triphasic        +---------+------------------+-----+---------+-------+ Popliteal                       triphasic        +---------+------------------+-----+---------+-------+ ATA      131               1.30 triphasic        +---------+------------------+-----+---------+-------+ PTA      123  1.22 triphasic        +---------+------------------+-----+---------+-------+ PERO     124               1.23 triphasic        +---------+------------------+-----+---------+-------+ Great Toe107               1.06                  +---------+------------------+-----+---------+-------+  +-------+-------------------+----------------+ ABI/TBIToday's ABI/TBI    Previous ABI/TBI +-------+-------------------+----------------+ Right  ABI 1.21 / TBI 1.07                  +-------+-------------------+----------------+ Left   ABI 1.30 / TBI 1.06                 +-------+-------------------+----------------+ Final Interpretation: Right: Resting right ankle-brachial index is within normal range. No evidence of significant right lower extremity arterial disease. The right toe-brachial index is normal.  Left: Resting left ankle-brachial index is within normal range. No evidence of significant left lower extremity arterial disease. The left toe-brachial index is normal.    Echocardiography performed on 07/21/17:  Study Conclusions   - Left ventricle: The cavity size was normal. Wall thickness was   increased in a pattern of mild LVH. Systolic function was normal.   The estimated ejection fraction was in the range of 55% to 60%.   Doppler parameters are consistent with abnormal left ventricular   relaxation (grade 1 diastolic dysfunction). - Atrial septum: No defect or patent foramen ovale was identified.    ASSESSMENT:    1. End stage renal disease ([place])   2. Essential hypertension   3. Hypotension, unspecified hypotension type      PLAN:  In order of problems listed above:  1. On chronic dialysis. 2. No clinical evidence of hypertension. In fact the ABI demonstrated normal lower extremity blood pressures in the 120-130 mmHg range with normal ABI 3. Hypotension/difficulty measuring blood pressure could be related to upper extremity occlusive disease. We'll plan upper extremity arterial duplex and also evaluate arch if possible. If we find no obstructive disease will consider repeating an ischemic evaluation although there are no cardiopulmonary complaints. Recent echo demonstrates no evidence of pericardial effusion or left/right ventricular dysfunction.  With normal ventricular contractility, normal lower extremity pressures, and difficulty measuring upper extremity pressures, believe that the low blood pressures were present and obstructive  arterial process. Upper extremity arterial duplex will be performed. If this study is indeterminate/10 nuclear difficult, may need to have CT angiography of the arch and subclavian territories. Finally, dialysis induced left ventricular systolic dysfunction with low blood pressure would be a consideration as well but in absence of dyspnea or some other cardiopulmonary complaints this seems unlikely.  When necessary follow-up based upon findings.  Medication Adjustments/Labs and Tests Ordered: Current medicines are reviewed at length with the patient today.  Concerns regarding medicines are outlined above.  Medication changes, Labs and Tests ordered today are listed in the Patient Instructions below. Patient Instructions  Medication Instructions:  Your physician recommends that you continue on your current medications as directed. Please refer to the Current Medication list given to you today.  Labwork: None  Testing/Procedures: Your physician has requested that you have a upper extremity arterial duplex. This test is an ultrasound of the arteries in the arms. It looks at arterial blood flow in the arms. Allow one hour for Upper Arterial scans. There are no restrictions or special instructions   Follow-Up: Your physician  recommends that you schedule a follow-up appointment as needed with Dr. Tamala Julian.    Any Other Special Instructions Will Be Listed Below (If Applicable).     If you need a refill on your cardiac medications before your next appointment, please call your pharmacy.      Signed, Sinclair Grooms, MD  08/22/2017 1:02 PM    [facility] [place], [place], [place]  [address] Phone: [phone]; Fax: [phone]

## 2017-08-22 ENCOUNTER — Ambulatory Visit (INDEPENDENT_AMBULATORY_CARE_PROVIDER_SITE_OTHER): Payer: Medicare Other | Admitting: Interventional Cardiology

## 2017-08-22 ENCOUNTER — Encounter: Payer: Self-pay | Admitting: Interventional Cardiology

## 2017-08-22 VITALS — BP 92/60 | HR 99 | Ht 63.5 in | Wt 193.8 lb

## 2017-08-22 DIAGNOSIS — I10 Essential (primary) hypertension: Secondary | ICD-10-CM

## 2017-08-22 DIAGNOSIS — I959 Hypotension, unspecified: Secondary | ICD-10-CM | POA: Diagnosis not present

## 2017-08-22 DIAGNOSIS — N186 End stage renal disease: Secondary | ICD-10-CM

## 2017-08-22 DIAGNOSIS — D509 Iron deficiency anemia, unspecified: Secondary | ICD-10-CM | POA: Diagnosis not present

## 2017-08-22 DIAGNOSIS — N2581 Secondary hyperparathyroidism of renal origin: Secondary | ICD-10-CM | POA: Diagnosis not present

## 2017-08-22 NOTE — Patient Instructions (Signed)
Medication Instructions:  Your physician recommends that you continue on your current medications as directed. Please refer to the Current Medication list given to you today.  Labwork: None  Testing/Procedures: Your physician has requested that you have a upper extremity arterial duplex. This test is an ultrasound of the arteries in the arms. It looks at arterial blood flow in the arms. Allow one hour for Upper Arterial scans. There are no restrictions or special instructions   Follow-Up: Your physician recommends that you schedule a follow-up appointment as needed with Dr. Tamala Julian.    Any Other Special Instructions Will Be Listed Below (If Applicable).     If you need a refill on your cardiac medications before your next appointment, please call your pharmacy.

## 2017-08-24 ENCOUNTER — Other Ambulatory Visit: Payer: Self-pay | Admitting: Interventional Cardiology

## 2017-08-24 DIAGNOSIS — I959 Hypotension, unspecified: Secondary | ICD-10-CM

## 2017-08-24 DIAGNOSIS — D509 Iron deficiency anemia, unspecified: Secondary | ICD-10-CM | POA: Diagnosis not present

## 2017-08-24 DIAGNOSIS — N2581 Secondary hyperparathyroidism of renal origin: Secondary | ICD-10-CM | POA: Diagnosis not present

## 2017-08-24 DIAGNOSIS — N186 End stage renal disease: Secondary | ICD-10-CM | POA: Diagnosis not present

## 2017-08-26 DIAGNOSIS — D509 Iron deficiency anemia, unspecified: Secondary | ICD-10-CM | POA: Diagnosis not present

## 2017-08-26 DIAGNOSIS — N2581 Secondary hyperparathyroidism of renal origin: Secondary | ICD-10-CM | POA: Diagnosis not present

## 2017-08-26 DIAGNOSIS — N186 End stage renal disease: Secondary | ICD-10-CM | POA: Diagnosis not present

## 2017-08-30 DIAGNOSIS — D509 Iron deficiency anemia, unspecified: Secondary | ICD-10-CM | POA: Diagnosis not present

## 2017-08-30 DIAGNOSIS — N2581 Secondary hyperparathyroidism of renal origin: Secondary | ICD-10-CM | POA: Diagnosis not present

## 2017-08-30 DIAGNOSIS — N186 End stage renal disease: Secondary | ICD-10-CM | POA: Diagnosis not present

## 2017-08-31 DIAGNOSIS — D509 Iron deficiency anemia, unspecified: Secondary | ICD-10-CM | POA: Diagnosis not present

## 2017-08-31 DIAGNOSIS — N2581 Secondary hyperparathyroidism of renal origin: Secondary | ICD-10-CM | POA: Diagnosis not present

## 2017-08-31 DIAGNOSIS — N186 End stage renal disease: Secondary | ICD-10-CM | POA: Diagnosis not present

## 2017-09-02 DIAGNOSIS — N2581 Secondary hyperparathyroidism of renal origin: Secondary | ICD-10-CM | POA: Diagnosis not present

## 2017-09-02 DIAGNOSIS — D509 Iron deficiency anemia, unspecified: Secondary | ICD-10-CM | POA: Diagnosis not present

## 2017-09-02 DIAGNOSIS — N186 End stage renal disease: Secondary | ICD-10-CM | POA: Diagnosis not present

## 2017-09-05 DIAGNOSIS — N2581 Secondary hyperparathyroidism of renal origin: Secondary | ICD-10-CM | POA: Diagnosis not present

## 2017-09-05 DIAGNOSIS — D509 Iron deficiency anemia, unspecified: Secondary | ICD-10-CM | POA: Diagnosis not present

## 2017-09-05 DIAGNOSIS — N186 End stage renal disease: Secondary | ICD-10-CM | POA: Diagnosis not present

## 2017-09-07 DIAGNOSIS — N2581 Secondary hyperparathyroidism of renal origin: Secondary | ICD-10-CM | POA: Diagnosis not present

## 2017-09-07 DIAGNOSIS — N186 End stage renal disease: Secondary | ICD-10-CM | POA: Diagnosis not present

## 2017-09-07 DIAGNOSIS — D509 Iron deficiency anemia, unspecified: Secondary | ICD-10-CM | POA: Diagnosis not present

## 2017-09-09 DIAGNOSIS — N186 End stage renal disease: Secondary | ICD-10-CM | POA: Diagnosis not present

## 2017-09-09 DIAGNOSIS — D509 Iron deficiency anemia, unspecified: Secondary | ICD-10-CM | POA: Diagnosis not present

## 2017-09-09 DIAGNOSIS — N2581 Secondary hyperparathyroidism of renal origin: Secondary | ICD-10-CM | POA: Diagnosis not present

## 2017-09-11 DIAGNOSIS — N186 End stage renal disease: Secondary | ICD-10-CM | POA: Diagnosis not present

## 2017-09-11 DIAGNOSIS — D509 Iron deficiency anemia, unspecified: Secondary | ICD-10-CM | POA: Diagnosis not present

## 2017-09-11 DIAGNOSIS — N2581 Secondary hyperparathyroidism of renal origin: Secondary | ICD-10-CM | POA: Diagnosis not present

## 2017-09-14 DIAGNOSIS — N2581 Secondary hyperparathyroidism of renal origin: Secondary | ICD-10-CM | POA: Diagnosis not present

## 2017-09-14 DIAGNOSIS — D509 Iron deficiency anemia, unspecified: Secondary | ICD-10-CM | POA: Diagnosis not present

## 2017-09-14 DIAGNOSIS — N186 End stage renal disease: Secondary | ICD-10-CM | POA: Diagnosis not present

## 2017-09-15 ENCOUNTER — Ambulatory Visit (HOSPITAL_COMMUNITY)
Admission: RE | Admit: 2017-09-15 | Discharge: 2017-09-15 | Disposition: A | Discharge status: Home / Self Care | Payer: Medicare Other | Source: Ambulatory Visit | Attending: Cardiology | Admitting: Cardiology

## 2017-09-15 ENCOUNTER — Other Ambulatory Visit: Payer: Self-pay | Admitting: Interventional Cardiology

## 2017-09-15 DIAGNOSIS — I770 Arteriovenous fistula, acquired: Secondary | ICD-10-CM

## 2017-09-15 DIAGNOSIS — I959 Hypotension, unspecified: Secondary | ICD-10-CM

## 2017-09-16 DIAGNOSIS — N186 End stage renal disease: Secondary | ICD-10-CM | POA: Diagnosis not present

## 2017-09-16 DIAGNOSIS — D509 Iron deficiency anemia, unspecified: Secondary | ICD-10-CM | POA: Diagnosis not present

## 2017-09-16 DIAGNOSIS — N2581 Secondary hyperparathyroidism of renal origin: Secondary | ICD-10-CM | POA: Diagnosis not present

## 2017-09-18 DIAGNOSIS — D509 Iron deficiency anemia, unspecified: Secondary | ICD-10-CM | POA: Diagnosis not present

## 2017-09-18 DIAGNOSIS — N2581 Secondary hyperparathyroidism of renal origin: Secondary | ICD-10-CM | POA: Diagnosis not present

## 2017-09-18 DIAGNOSIS — N186 End stage renal disease: Secondary | ICD-10-CM | POA: Diagnosis not present

## 2017-09-19 DIAGNOSIS — N186 End stage renal disease: Secondary | ICD-10-CM | POA: Diagnosis not present

## 2017-09-19 DIAGNOSIS — Z992 Dependence on renal dialysis: Secondary | ICD-10-CM | POA: Diagnosis not present

## 2017-09-19 DIAGNOSIS — I120 Hypertensive chronic kidney disease with stage 5 chronic kidney disease or end stage renal disease: Secondary | ICD-10-CM | POA: Diagnosis not present

## 2017-09-21 DIAGNOSIS — N2581 Secondary hyperparathyroidism of renal origin: Secondary | ICD-10-CM | POA: Diagnosis not present

## 2017-09-21 DIAGNOSIS — D509 Iron deficiency anemia, unspecified: Secondary | ICD-10-CM | POA: Diagnosis not present

## 2017-09-21 DIAGNOSIS — N186 End stage renal disease: Secondary | ICD-10-CM | POA: Diagnosis not present

## 2017-09-23 DIAGNOSIS — N186 End stage renal disease: Secondary | ICD-10-CM | POA: Diagnosis not present

## 2017-09-23 DIAGNOSIS — N2581 Secondary hyperparathyroidism of renal origin: Secondary | ICD-10-CM | POA: Diagnosis not present

## 2017-09-23 DIAGNOSIS — D509 Iron deficiency anemia, unspecified: Secondary | ICD-10-CM | POA: Diagnosis not present

## 2017-09-26 DIAGNOSIS — D509 Iron deficiency anemia, unspecified: Secondary | ICD-10-CM | POA: Diagnosis not present

## 2017-09-26 DIAGNOSIS — N2581 Secondary hyperparathyroidism of renal origin: Secondary | ICD-10-CM | POA: Diagnosis not present

## 2017-09-26 DIAGNOSIS — N186 End stage renal disease: Secondary | ICD-10-CM | POA: Diagnosis not present

## 2017-09-28 DIAGNOSIS — N2581 Secondary hyperparathyroidism of renal origin: Secondary | ICD-10-CM | POA: Diagnosis not present

## 2017-09-28 DIAGNOSIS — D509 Iron deficiency anemia, unspecified: Secondary | ICD-10-CM | POA: Diagnosis not present

## 2017-09-28 DIAGNOSIS — N186 End stage renal disease: Secondary | ICD-10-CM | POA: Diagnosis not present

## 2017-09-30 DIAGNOSIS — D509 Iron deficiency anemia, unspecified: Secondary | ICD-10-CM | POA: Diagnosis not present

## 2017-09-30 DIAGNOSIS — N186 End stage renal disease: Secondary | ICD-10-CM | POA: Diagnosis not present

## 2017-09-30 DIAGNOSIS — N2581 Secondary hyperparathyroidism of renal origin: Secondary | ICD-10-CM | POA: Diagnosis not present

## 2017-10-03 DIAGNOSIS — N2581 Secondary hyperparathyroidism of renal origin: Secondary | ICD-10-CM | POA: Diagnosis not present

## 2017-10-03 DIAGNOSIS — N186 End stage renal disease: Secondary | ICD-10-CM | POA: Diagnosis not present

## 2017-10-03 DIAGNOSIS — D509 Iron deficiency anemia, unspecified: Secondary | ICD-10-CM | POA: Diagnosis not present

## 2017-10-05 DIAGNOSIS — N186 End stage renal disease: Secondary | ICD-10-CM | POA: Diagnosis not present

## 2017-10-05 DIAGNOSIS — N2581 Secondary hyperparathyroidism of renal origin: Secondary | ICD-10-CM | POA: Diagnosis not present

## 2017-10-05 DIAGNOSIS — D509 Iron deficiency anemia, unspecified: Secondary | ICD-10-CM | POA: Diagnosis not present

## 2017-10-07 ENCOUNTER — Encounter: Payer: Self-pay | Admitting: *Deleted

## 2017-10-07 DIAGNOSIS — N2581 Secondary hyperparathyroidism of renal origin: Secondary | ICD-10-CM | POA: Diagnosis not present

## 2017-10-07 DIAGNOSIS — D509 Iron deficiency anemia, unspecified: Secondary | ICD-10-CM | POA: Diagnosis not present

## 2017-10-07 DIAGNOSIS — N186 End stage renal disease: Secondary | ICD-10-CM | POA: Diagnosis not present

## 2017-10-10 DIAGNOSIS — N186 End stage renal disease: Secondary | ICD-10-CM | POA: Diagnosis not present

## 2017-10-10 DIAGNOSIS — D509 Iron deficiency anemia, unspecified: Secondary | ICD-10-CM | POA: Diagnosis not present

## 2017-10-10 DIAGNOSIS — N2581 Secondary hyperparathyroidism of renal origin: Secondary | ICD-10-CM | POA: Diagnosis not present

## 2017-10-12 DIAGNOSIS — N2581 Secondary hyperparathyroidism of renal origin: Secondary | ICD-10-CM | POA: Diagnosis not present

## 2017-10-12 DIAGNOSIS — N186 End stage renal disease: Secondary | ICD-10-CM | POA: Diagnosis not present

## 2017-10-12 DIAGNOSIS — D509 Iron deficiency anemia, unspecified: Secondary | ICD-10-CM | POA: Diagnosis not present

## 2017-10-14 DIAGNOSIS — D509 Iron deficiency anemia, unspecified: Secondary | ICD-10-CM | POA: Diagnosis not present

## 2017-10-14 DIAGNOSIS — N2581 Secondary hyperparathyroidism of renal origin: Secondary | ICD-10-CM | POA: Diagnosis not present

## 2017-10-14 DIAGNOSIS — N186 End stage renal disease: Secondary | ICD-10-CM | POA: Diagnosis not present

## 2017-10-17 DIAGNOSIS — N186 End stage renal disease: Secondary | ICD-10-CM | POA: Diagnosis not present

## 2017-10-17 DIAGNOSIS — N2581 Secondary hyperparathyroidism of renal origin: Secondary | ICD-10-CM | POA: Diagnosis not present

## 2017-10-17 DIAGNOSIS — D509 Iron deficiency anemia, unspecified: Secondary | ICD-10-CM | POA: Diagnosis not present

## 2017-10-18 ENCOUNTER — Emergency Department (HOSPITAL_COMMUNITY)
Admission: EM | Admit: 2017-10-18 | Discharge: 2017-10-19 | Disposition: A | Discharge status: Home / Self Care | Payer: Medicare Other | Attending: Emergency Medicine | Admitting: Emergency Medicine

## 2017-10-18 ENCOUNTER — Other Ambulatory Visit: Payer: Self-pay

## 2017-10-18 ENCOUNTER — Encounter (HOSPITAL_COMMUNITY): Payer: Self-pay | Admitting: Emergency Medicine

## 2017-10-18 ENCOUNTER — Emergency Department (HOSPITAL_COMMUNITY): Payer: Medicare Other

## 2017-10-18 DIAGNOSIS — M542 Cervicalgia: Secondary | ICD-10-CM | POA: Diagnosis present

## 2017-10-18 DIAGNOSIS — R1310 Dysphagia, unspecified: Secondary | ICD-10-CM | POA: Diagnosis not present

## 2017-10-18 DIAGNOSIS — J029 Acute pharyngitis, unspecified: Secondary | ICD-10-CM | POA: Diagnosis not present

## 2017-10-18 DIAGNOSIS — Z992 Dependence on renal dialysis: Secondary | ICD-10-CM | POA: Insufficient documentation

## 2017-10-18 DIAGNOSIS — N186 End stage renal disease: Secondary | ICD-10-CM | POA: Diagnosis not present

## 2017-10-18 DIAGNOSIS — Z79899 Other long term (current) drug therapy: Secondary | ICD-10-CM | POA: Insufficient documentation

## 2017-10-18 DIAGNOSIS — K1120 Sialoadenitis, unspecified: Secondary | ICD-10-CM | POA: Diagnosis not present

## 2017-10-18 DIAGNOSIS — Z7982 Long term (current) use of aspirin: Secondary | ICD-10-CM | POA: Diagnosis not present

## 2017-10-18 DIAGNOSIS — R221 Localized swelling, mass and lump, neck: Secondary | ICD-10-CM | POA: Diagnosis not present

## 2017-10-18 DIAGNOSIS — I120 Hypertensive chronic kidney disease with stage 5 chronic kidney disease or end stage renal disease: Secondary | ICD-10-CM | POA: Diagnosis not present

## 2017-10-18 LAB — RAPID STREP SCREEN (MED CTR MEBANE ONLY): STREPTOCOCCUS, GROUP A SCREEN (DIRECT): NEGATIVE

## 2017-10-18 LAB — BASIC METABOLIC PANEL
Anion gap: 15 (ref 5–15)
BUN: 42 mg/dL — ABNORMAL HIGH (ref 6–20)
CALCIUM: 8.1 mg/dL — AB (ref 8.9–10.3)
CO2: 26 mmol/L (ref 22–32)
CREATININE: 11.53 mg/dL — AB (ref 0.44–1.00)
Chloride: 100 mmol/L — ABNORMAL LOW (ref 101–111)
GFR calc Af Amer: 4 mL/min — ABNORMAL LOW (ref >60)
GFR calc non Af Amer: 3 mL/min — ABNORMAL LOW (ref >60)
Glucose, Bld: 95 mg/dL (ref 65–99)
Potassium: 4.6 mmol/L (ref 3.5–5.1)
SODIUM: 141 mmol/L (ref 135–145)

## 2017-10-18 LAB — CBC
HCT: 48 % — ABNORMAL HIGH (ref 36.0–46.0)
Hemoglobin: 16 g/dL — ABNORMAL HIGH (ref 12.0–15.0)
MCH: 28.6 pg (ref 26.0–34.0)
MCHC: 33.3 g/dL (ref 30.0–36.0)
MCV: 85.7 fL (ref 78.0–100.0)
PLATELETS: 188 10*3/uL (ref 150–400)
RBC: 5.6 MIL/uL — ABNORMAL HIGH (ref 3.87–5.11)
RDW: 14.2 % (ref 11.5–15.5)
WBC: 10.4 10*3/uL (ref 4.0–10.5)

## 2017-10-18 MED ORDER — FENTANYL CITRATE (PF) 100 MCG/2ML IJ SOLN
50.0000 ug | Freq: Once | INTRAMUSCULAR | Status: AC
  Administered 2017-10-18: 50 ug via INTRAVENOUS
  Filled 2017-10-18: qty 2

## 2017-10-18 MED ORDER — IOPAMIDOL (ISOVUE-300) INJECTION 61%
INTRAVENOUS | Status: AC
  Administered 2017-10-18: 75 mL
  Filled 2017-10-18: qty 75

## 2017-10-18 NOTE — ED Provider Notes (Signed)
[facility] Provider Note   CSN: [id number] Arrival date & time: 10/18/17  1858     History   Chief Complaint Chief Complaint  Patient presents with  . Neck Swelling    HPI Victoria [name] is a 62 y.o. female.  Patient presents with a 2-day history of bilateral neck pain and swelling and difficulty swallowing for the past 2 days.  She states someone was sick at dialysis last week and she believes she got sick from them.  No fever.  No difficulty breathing or difficulty swallowing.  Does have pain with swallowing and swelling on the left side of her neck that is worse in the right side.  No dental pain.  She is due for dialysis in the morning has had no missed sessions. Denies any cough or runny nose.  Denies any pain with her teeth.  Denies any trauma.   The history is provided by the patient.    Past Medical History:  Diagnosis Date  . Chronic bronchitis ([place])   . Complication of anesthesia ~ 2011   "they gave me a medicine that swolled me and mouth burning up" (08/23/2012)  . Complication of anesthesia    slow to wake up  . ESRD (end stage renal disease) on dialysis [facility]) Nephrologist-- dr deterding   ESRD due to HTN-; "M/W/F; [facility]" (11/28/2015  . GERD (gastroesophageal reflux disease)   . History of acute pulmonary edema    2003  . Hypertension    no longer on medications  . Left patella fracture   . Pneumonia    as a child  . Seasonal allergies   . Secondary hyperparathyroidism, renal ([place])    s/p  total parathyroidectomy  2014  . Thyroid disease   . Wears glasses     Patient Active Problem List   Diagnosis Date Noted  . Dehiscence of operative wound 11/28/2015  . Wound dehiscence, surgical 11/28/2015  . Renal dialysis device, implant, or graft complication 19/41/7408  . Aftercare following surgery of the circulatory system, [place] 03/30/2013  . Mechanical complication of other vascular device, implant, and graft  01/12/2013  . Other and unspecified hyperlipidemia 10/25/2012  . End stage renal disease ([place]) 09/22/2012  . Chest pain, mid sternal 08/23/2012  . Hyperparathyroidism, secondary renal ([place]) 03/22/2011  . Thyroid nodule 03/22/2011  . High blood pressure 03/10/2011  . Kidney disease 03/10/2011    Past Surgical History:  Procedure Laterality Date  . ANKLE FRACTURE SURGERY Right ~ 2005   "has pins in it"  . APPLICATION OF WOUND VAC Left 11/28/2015   knee  . APPLICATION OF WOUND VAC Left 11/28/2015   Procedure: APPLICATION OF WOUND VAC;  Surgeon: Rod Can, MD;  Location: [facility];  Service: Orthopedics;  Laterality: Left;  . ARTERIOVENOUS GRAFT PLACEMENT  03-25-2005   Right forearm  w/  multiple Revision's   . CARDIOVASCULAR STRESS TEST  08-24-2012   abnormal nuclear study/  inferolateral and anteroseptal areas of scar,  no ischemia/  normal LV function and wall motion, ef 77%  . DIALYSIS FISTULA CREATION  1992   left upper arm ---  w/  Multiple Revision's until 2006  . ECTOPIC PREGNANCY SURGERY  1970's  . FRACTURE SURGERY    . I&D EXTREMITY Left 11/28/2015   Procedure: IRRIGATION AND DEBRIDEMENT LEFT KNEE WOUND ;  Surgeon: Rod Can, MD;  Location: [facility];  Service: Orthopedics;  Laterality: Left;  . INCISION AND DRAINAGE OF WOUND Left 11/28/2015  knee  . ORIF PATELLA Left 10/31/2015   Procedure: OPEN REDUCTION INTERNAL (ORIF) FIXATION LEFT PATELLA;  Surgeon: Rod Can, MD;  Location: [facility];  Service: Orthopedics;  Laterality: Left;  . PATELLAR TENDON REPAIR  10/03/2012   Procedure: PATELLA TENDON REPAIR;  Surgeon: Marin Shutter, MD;  Location: [facility];  Service: Orthopedics;  Laterality: Right;  . PATELLECTOMY  10/03/2012   Procedure: PATELLECTOMY;  Surgeon: Marin Shutter, MD;  Location: [facility];  Service: Orthopedics;  Laterality: Right;  RIGHT PARTIAL PATELLECTOMY AND PATELLA TENDON REPAIR  . REVISION OF ARTERIOVENOUS GORETEX GRAFT  09/27/2012   Procedure:  REVISION OF ARTERIOVENOUS GORETEX GRAFT;  Surgeon: Mal Misty, MD;  Location: [facility];  Service: Vascular;  Laterality: Right;  1) Replacement of venous half of loop with 88mm Gortex graft  2) Excision of erroded pseudoaneurysm of graft with primary closure.  Marland Kitchen REVISION OF ARTERIOVENOUS GORETEX GRAFT Right 01/23/2013   Procedure: REVISION OF ARTERIOVENOUS GORETEX GRAFT;  Surgeon: Conrad [place], MD;  [facility];  Service: Vascular;  Laterality: Right;  Using piece of 53mm x 20cm Gortex graft.   Marland Kitchen REVISION OF ARTERIOVENOUS GORETEX GRAFT Right 10/07/2015   Procedure: REVISION OF Right arm ARTERIOVENOUS GORETEX GRAFT;  Surgeon: Elam Dutch, MD;  Location: [facility] OR;  Service: Vascular;  Laterality: Right;  . REVISION OF ARTERIOVENOUS GORETEX GRAFT Right 02/17/2016   Procedure: REVISION OF ARTERIOVENOUS GORETEX GRAFT;  Surgeon: Elam Dutch, MD;  Location: [facility];  Service: Vascular;  Laterality: Right;  . TOTAL ABDOMINAL HYSTERECTOMY  03-05-2005   w/  Right Ovarian Cystectomy  . TOTAL PARATHYROIDECTOMY/  THYROID ISTHMUSECTOMY/  AUTOTRANSPLANTATION PARATHYROID TISSUE TO LEFT BRACHIORIADIALIS MUSCLE  02-18-2011  . TRANSTHORACIC ECHOCARDIOGRAM  10-31-2012   mild LVH,  grade 1 diastolic dysfunction,  ef 55-65%/  mild MR/  trivial TR    OB History    No data available       Home Medications    Prior to Admission medications   Medication Sig Start Date End Date Taking? Authorizing Provider  aspirin 81 MG chewable tablet Chew 81 mg by mouth daily.    [provider]  calcium acetate (PHOSLO) 667 MG capsule Take 2,001 mg by mouth 3 (three) times daily with meals.     [provider]    Family History Family History  Problem Relation Age of Onset  . Healthy Mother   . Hypertension Father   . Kidney failure Father   . Hypertension Sister   . Thyroid disease Sister     Social History Social History   Tobacco Use  . Smoking status: Never Smoker  . Smokeless tobacco:  Never Used  Substance Use Topics  . Alcohol use: No    Alcohol/week: 0.0 oz    Comment: quit 2007  . Drug use: No     Allergies   Amlodipine and Lisinopril   Review of Systems Review of Systems  Constitutional: Negative for activity change, appetite change and fever.  HENT: Positive for sore throat and trouble swallowing. Negative for congestion, nosebleeds and voice change.   Respiratory: Negative for cough, chest tightness and shortness of breath.   Cardiovascular: Negative for chest pain.  Gastrointestinal: Negative for abdominal pain, nausea and vomiting.  Genitourinary: Negative for dysuria, hematuria, vaginal bleeding and vaginal discharge.  Musculoskeletal: Negative for arthralgias and myalgias.  Skin: Negative for rash.  Neurological: Negative for dizziness, weakness, light-headedness and headaches.    all other systems are  negative except as noted in the HPI and PMH.    Physical Exam Updated Vital Signs BP 129/78 (BP Location: Left Arm)   Pulse 93   Temp 98.6 F (37 C) (Oral)   Resp 16   Ht 5\' 3"  (1.6 m)   Wt 86.2 kg (190 lb)   SpO2 99%   BMI 33.66 kg/m   Physical Exam  Constitutional: She is oriented to person, place, and time. She appears well-developed and well-nourished. No distress.  Speaking in full sentences.  Controlling secretions.  HENT:  Head: Normocephalic and atraumatic.  Mouth/Throat: Oropharyngeal exudate present.  Anterior neck swelling with tender lymphadenopathy. L>R Floor of mouth soft. Uvula midline No asymmetry of posterior pharynx No tongue or lip swelling.   Eyes: Conjunctivae and EOM are normal. Pupils are equal, round, and reactive to light.  Neck: Normal range of motion. Neck supple.  No meningismus.  Cardiovascular: Normal rate, regular rhythm, normal heart sounds and intact distal pulses.  No murmur heard. Pulmonary/Chest: Effort normal and breath sounds normal. No respiratory distress.  Abdominal: Soft. There is no  tenderness. There is no rebound and no guarding.  Musculoskeletal: Normal range of motion. She exhibits no edema or tenderness.  Lymphadenopathy:    She has cervical adenopathy.  Neurological: She is alert and oriented to person, place, and time. No cranial nerve deficit. She exhibits normal muscle tone. Coordination normal.  No ataxia on finger to nose bilaterally. No pronator drift. 5/5 strength throughout. CN 2-12 intact.Equal grip strength. Sensation intact.   Skin: Skin is warm.  Psychiatric: She has a normal mood and affect. Her behavior is normal.  Nursing note and vitals reviewed.    ED Treatments / Results  Labs (all labs ordered are listed, but only abnormal results are displayed) Labs Reviewed  CBC - Abnormal; Notable for the following components:      Result Value   RBC 5.60 (*)    Hemoglobin 16.0 (*)    HCT 48.0 (*)    All other components within normal limits  BASIC METABOLIC PANEL - Abnormal; Notable for the following components:   Chloride 100 (*)    BUN 42 (*)    Creatinine, Ser 11.53 (*)    Calcium 8.1 (*)    GFR calc non Af Amer 3 (*)    GFR calc Af Amer 4 (*)    All other components within normal limits  RAPID STREP SCREEN (NOT AT [facility])  CULTURE, GROUP A STREP [facility])  MONONUCLEOSIS SCREEN    EKG  EKG Interpretation None       Radiology Ct Soft Tissue Neck W Contrast  Result Date: 10/19/2017 CLINICAL DATA:  62 y/o F; painful left-sided neck mass with trouble swallowing for 1 week. EXAM: CT NECK WITH CONTRAST TECHNIQUE: Multidetector CT imaging of the neck was performed using the standard protocol following the bolus administration of intravenous contrast. CONTRAST:  43mL ISOVUE-300 IOPAMIDOL (ISOVUE-300) INJECTION 61% COMPARISON:  11/16/2015 CT of the neck. FINDINGS: Pharynx and larynx: Normal. No mass or swelling. Salivary glands: Asymmetric enhancement of left submandibular gland and minimal fat stranding surrounding the gland probably representing  sialadenitis. No ductal enlargement or obstructing stone identified. Other salivary glands are unremarkable. Thyroid: Normal. Lymph nodes: None enlarged or abnormal density. Vascular: Mild calcific atherosclerosis of aortic arch and carotid bifurcations. Limited intracranial: Negative. Visualized orbits: Negative. Mastoids and visualized paranasal sinuses: Clear. Skeleton: No acute or aggressive process. Upper chest: Negative. Other: None. IMPRESSION: 1. Left submandibular sialadenitis. No ductal enlargement  or obstructing stone identified. 2. No neck mass or abscess. No appreciable inflammatory changes of aerodigestive tract. Electronically Signed   By: Kristine Garbe M.D.   On: 10/19/2017 00:27    Procedures Procedures (including critical care time)  Medications Ordered in ED Medications  fentaNYL (SUBLIMAZE) injection 50 mcg (not administered)     Initial Impression / Assessment and Plan / ED Course  I have reviewed the triage vital signs and the nursing notes.  Pertinent labs & imaging results that were available during my care of the patient were reviewed by me and considered in my medical decision making (see chart for details).    2 days of anterior throat pain and trouble swallowing.  She is controlling her secretions and in no distress.  There is tender lymphadenopathy left greater than right.  Imaging is negative for abscess.  Does show sialoadenitis without obstructing stone.  We will treat supportively with heat, massage in the gland, anti-inflammatories and antibiotics.  Patient instructed to use sour candies to help stimulate salivary flow. Follow up with ENT. Dialysis in the AM. Return precautions discussed.  Final Clinical Impressions(s) / ED Diagnoses   Final diagnoses:  Sialadenitis    ED Discharge Orders    None       Infant Doane, Annie Main, MD 10/19/17 [phone]

## 2017-10-18 NOTE — ED Triage Notes (Signed)
Pt also had red, enlarged tonsils. Denies sore throat, denies fever/chills

## 2017-10-18 NOTE — ED Triage Notes (Signed)
Pt reports bilateral neck swelling present since Sunday. Pt reports she has had trouble swallowing, no pain with swallowing. Tender with palpation under chin area, swollen lymph nodes noted.  Dialysis, MWF, pt is compliant.

## 2017-10-18 NOTE — ED Notes (Signed)
Patient transported to CT 

## 2017-10-19 DIAGNOSIS — K1120 Sialoadenitis, unspecified: Secondary | ICD-10-CM | POA: Diagnosis not present

## 2017-10-19 DIAGNOSIS — N2581 Secondary hyperparathyroidism of renal origin: Secondary | ICD-10-CM | POA: Diagnosis not present

## 2017-10-19 DIAGNOSIS — D509 Iron deficiency anemia, unspecified: Secondary | ICD-10-CM | POA: Diagnosis not present

## 2017-10-19 DIAGNOSIS — N186 End stage renal disease: Secondary | ICD-10-CM | POA: Diagnosis not present

## 2017-10-19 MED ORDER — DICLOXACILLIN SODIUM 500 MG PO CAPS: 500.0000 mg | ORAL_CAPSULE | Freq: Four times a day (QID) | ORAL | 0 refills | Status: DC

## 2017-10-19 MED ORDER — DICLOXACILLIN SODIUM 500 MG PO CAPS
500.0000 mg | ORAL_CAPSULE | Freq: Once | ORAL | Status: AC
  Administered 2017-10-19: 500 mg via ORAL
  Filled 2017-10-19: qty 1

## 2017-10-19 MED ORDER — FENTANYL CITRATE (PF) 100 MCG/2ML IJ SOLN
50.0000 ug | Freq: Once | INTRAMUSCULAR | Status: AC
  Administered 2017-10-19: 50 ug via INTRAVENOUS
  Filled 2017-10-19: qty 2

## 2017-10-19 MED ORDER — IBUPROFEN 400 MG PO TABS: 400.0000 mg | ORAL_TABLET | Freq: Four times a day (QID) | ORAL | 0 refills | Status: DC | PRN

## 2017-10-19 MED ORDER — DEXAMETHASONE SODIUM PHOSPHATE 10 MG/ML IJ SOLN
10.0000 mg | Freq: Once | INTRAMUSCULAR | Status: AC
  Administered 2017-10-19: 10 mg via INTRAVENOUS
  Filled 2017-10-19: qty 1

## 2017-10-19 NOTE — ED Notes (Signed)
Pt understood dc material. NAD Noted 

## 2017-10-19 NOTE — Discharge Instructions (Signed)
Use massage and heat as we discussed. Take the antibiotics as prescribed.  You should also use sour candies to help stimulate the saliva flow.  Follow-up for dialysis tomorrow as scheduled.  Return to the ED with difficulty breathing, difficulty swallowing or any other concerns.

## 2017-10-20 DIAGNOSIS — Z992 Dependence on renal dialysis: Secondary | ICD-10-CM | POA: Diagnosis not present

## 2017-10-20 DIAGNOSIS — I120 Hypertensive chronic kidney disease with stage 5 chronic kidney disease or end stage renal disease: Secondary | ICD-10-CM | POA: Diagnosis not present

## 2017-10-20 DIAGNOSIS — N186 End stage renal disease: Secondary | ICD-10-CM | POA: Diagnosis not present

## 2017-10-21 DIAGNOSIS — Z992 Dependence on renal dialysis: Secondary | ICD-10-CM | POA: Diagnosis not present

## 2017-10-21 DIAGNOSIS — I120 Hypertensive chronic kidney disease with stage 5 chronic kidney disease or end stage renal disease: Secondary | ICD-10-CM | POA: Diagnosis not present

## 2017-10-21 DIAGNOSIS — N186 End stage renal disease: Secondary | ICD-10-CM | POA: Diagnosis not present

## 2017-10-21 DIAGNOSIS — N2581 Secondary hyperparathyroidism of renal origin: Secondary | ICD-10-CM | POA: Diagnosis not present

## 2017-10-21 DIAGNOSIS — D509 Iron deficiency anemia, unspecified: Secondary | ICD-10-CM | POA: Diagnosis not present

## 2017-10-21 LAB — CULTURE, GROUP A STREP (THRC)

## 2017-10-24 DIAGNOSIS — N186 End stage renal disease: Secondary | ICD-10-CM | POA: Diagnosis not present

## 2017-10-24 DIAGNOSIS — D509 Iron deficiency anemia, unspecified: Secondary | ICD-10-CM | POA: Diagnosis not present

## 2017-10-24 DIAGNOSIS — N2581 Secondary hyperparathyroidism of renal origin: Secondary | ICD-10-CM | POA: Diagnosis not present

## 2017-10-26 DIAGNOSIS — D509 Iron deficiency anemia, unspecified: Secondary | ICD-10-CM | POA: Diagnosis not present

## 2017-10-26 DIAGNOSIS — N2581 Secondary hyperparathyroidism of renal origin: Secondary | ICD-10-CM | POA: Diagnosis not present

## 2017-10-26 DIAGNOSIS — N186 End stage renal disease: Secondary | ICD-10-CM | POA: Diagnosis not present

## 2017-10-28 DIAGNOSIS — D509 Iron deficiency anemia, unspecified: Secondary | ICD-10-CM | POA: Diagnosis not present

## 2017-10-28 DIAGNOSIS — N186 End stage renal disease: Secondary | ICD-10-CM | POA: Diagnosis not present

## 2017-10-28 DIAGNOSIS — N2581 Secondary hyperparathyroidism of renal origin: Secondary | ICD-10-CM | POA: Diagnosis not present

## 2017-10-31 DIAGNOSIS — D509 Iron deficiency anemia, unspecified: Secondary | ICD-10-CM | POA: Diagnosis not present

## 2017-10-31 DIAGNOSIS — N186 End stage renal disease: Secondary | ICD-10-CM | POA: Diagnosis not present

## 2017-10-31 DIAGNOSIS — N2581 Secondary hyperparathyroidism of renal origin: Secondary | ICD-10-CM | POA: Diagnosis not present

## 2017-11-02 DIAGNOSIS — D509 Iron deficiency anemia, unspecified: Secondary | ICD-10-CM | POA: Diagnosis not present

## 2017-11-02 DIAGNOSIS — N2581 Secondary hyperparathyroidism of renal origin: Secondary | ICD-10-CM | POA: Diagnosis not present

## 2017-11-02 DIAGNOSIS — N186 End stage renal disease: Secondary | ICD-10-CM | POA: Diagnosis not present

## 2017-11-04 DIAGNOSIS — N186 End stage renal disease: Secondary | ICD-10-CM | POA: Diagnosis not present

## 2017-11-04 DIAGNOSIS — D509 Iron deficiency anemia, unspecified: Secondary | ICD-10-CM | POA: Diagnosis not present

## 2017-11-04 DIAGNOSIS — N2581 Secondary hyperparathyroidism of renal origin: Secondary | ICD-10-CM | POA: Diagnosis not present

## 2017-11-07 DIAGNOSIS — N2581 Secondary hyperparathyroidism of renal origin: Secondary | ICD-10-CM | POA: Diagnosis not present

## 2017-11-07 DIAGNOSIS — N186 End stage renal disease: Secondary | ICD-10-CM | POA: Diagnosis not present

## 2017-11-07 DIAGNOSIS — D509 Iron deficiency anemia, unspecified: Secondary | ICD-10-CM | POA: Diagnosis not present

## 2017-11-09 DIAGNOSIS — N2581 Secondary hyperparathyroidism of renal origin: Secondary | ICD-10-CM | POA: Diagnosis not present

## 2017-11-09 DIAGNOSIS — N186 End stage renal disease: Secondary | ICD-10-CM | POA: Diagnosis not present

## 2017-11-09 DIAGNOSIS — D509 Iron deficiency anemia, unspecified: Secondary | ICD-10-CM | POA: Diagnosis not present

## 2017-11-11 DIAGNOSIS — N186 End stage renal disease: Secondary | ICD-10-CM | POA: Diagnosis not present

## 2017-11-11 DIAGNOSIS — D509 Iron deficiency anemia, unspecified: Secondary | ICD-10-CM | POA: Diagnosis not present

## 2017-11-11 DIAGNOSIS — N2581 Secondary hyperparathyroidism of renal origin: Secondary | ICD-10-CM | POA: Diagnosis not present

## 2017-11-14 DIAGNOSIS — N186 End stage renal disease: Secondary | ICD-10-CM | POA: Diagnosis not present

## 2017-11-14 DIAGNOSIS — N2581 Secondary hyperparathyroidism of renal origin: Secondary | ICD-10-CM | POA: Diagnosis not present

## 2017-11-14 DIAGNOSIS — D509 Iron deficiency anemia, unspecified: Secondary | ICD-10-CM | POA: Diagnosis not present

## 2017-11-16 DIAGNOSIS — N2581 Secondary hyperparathyroidism of renal origin: Secondary | ICD-10-CM | POA: Diagnosis not present

## 2017-11-16 DIAGNOSIS — D509 Iron deficiency anemia, unspecified: Secondary | ICD-10-CM | POA: Diagnosis not present

## 2017-11-16 DIAGNOSIS — N186 End stage renal disease: Secondary | ICD-10-CM | POA: Diagnosis not present

## 2017-11-18 DIAGNOSIS — Z992 Dependence on renal dialysis: Secondary | ICD-10-CM | POA: Diagnosis not present

## 2017-11-18 DIAGNOSIS — I120 Hypertensive chronic kidney disease with stage 5 chronic kidney disease or end stage renal disease: Secondary | ICD-10-CM | POA: Diagnosis not present

## 2017-11-18 DIAGNOSIS — D509 Iron deficiency anemia, unspecified: Secondary | ICD-10-CM | POA: Diagnosis not present

## 2017-11-18 DIAGNOSIS — N2581 Secondary hyperparathyroidism of renal origin: Secondary | ICD-10-CM | POA: Diagnosis not present

## 2017-11-18 DIAGNOSIS — N186 End stage renal disease: Secondary | ICD-10-CM | POA: Diagnosis not present

## 2017-11-21 DIAGNOSIS — N186 End stage renal disease: Secondary | ICD-10-CM | POA: Diagnosis not present

## 2017-11-21 DIAGNOSIS — D509 Iron deficiency anemia, unspecified: Secondary | ICD-10-CM | POA: Diagnosis not present

## 2017-11-21 DIAGNOSIS — N2581 Secondary hyperparathyroidism of renal origin: Secondary | ICD-10-CM | POA: Diagnosis not present

## 2017-11-23 DIAGNOSIS — D509 Iron deficiency anemia, unspecified: Secondary | ICD-10-CM | POA: Diagnosis not present

## 2017-11-23 DIAGNOSIS — N186 End stage renal disease: Secondary | ICD-10-CM | POA: Diagnosis not present

## 2017-11-23 DIAGNOSIS — N2581 Secondary hyperparathyroidism of renal origin: Secondary | ICD-10-CM | POA: Diagnosis not present

## 2017-11-25 DIAGNOSIS — N2581 Secondary hyperparathyroidism of renal origin: Secondary | ICD-10-CM | POA: Diagnosis not present

## 2017-11-25 DIAGNOSIS — D509 Iron deficiency anemia, unspecified: Secondary | ICD-10-CM | POA: Diagnosis not present

## 2017-11-25 DIAGNOSIS — N186 End stage renal disease: Secondary | ICD-10-CM | POA: Diagnosis not present

## 2017-11-28 DIAGNOSIS — N2581 Secondary hyperparathyroidism of renal origin: Secondary | ICD-10-CM | POA: Diagnosis not present

## 2017-11-28 DIAGNOSIS — D509 Iron deficiency anemia, unspecified: Secondary | ICD-10-CM | POA: Diagnosis not present

## 2017-11-28 DIAGNOSIS — N186 End stage renal disease: Secondary | ICD-10-CM | POA: Diagnosis not present

## 2017-11-30 DIAGNOSIS — D509 Iron deficiency anemia, unspecified: Secondary | ICD-10-CM | POA: Diagnosis not present

## 2017-11-30 DIAGNOSIS — N186 End stage renal disease: Secondary | ICD-10-CM | POA: Diagnosis not present

## 2017-11-30 DIAGNOSIS — N2581 Secondary hyperparathyroidism of renal origin: Secondary | ICD-10-CM | POA: Diagnosis not present

## 2017-12-02 DIAGNOSIS — N186 End stage renal disease: Secondary | ICD-10-CM | POA: Diagnosis not present

## 2017-12-02 DIAGNOSIS — D509 Iron deficiency anemia, unspecified: Secondary | ICD-10-CM | POA: Diagnosis not present

## 2017-12-02 DIAGNOSIS — N2581 Secondary hyperparathyroidism of renal origin: Secondary | ICD-10-CM | POA: Diagnosis not present

## 2017-12-05 DIAGNOSIS — D509 Iron deficiency anemia, unspecified: Secondary | ICD-10-CM | POA: Diagnosis not present

## 2017-12-05 DIAGNOSIS — N2581 Secondary hyperparathyroidism of renal origin: Secondary | ICD-10-CM | POA: Diagnosis not present

## 2017-12-05 DIAGNOSIS — N186 End stage renal disease: Secondary | ICD-10-CM | POA: Diagnosis not present

## 2017-12-07 DIAGNOSIS — N2581 Secondary hyperparathyroidism of renal origin: Secondary | ICD-10-CM | POA: Diagnosis not present

## 2017-12-07 DIAGNOSIS — N186 End stage renal disease: Secondary | ICD-10-CM | POA: Diagnosis not present

## 2017-12-07 DIAGNOSIS — D509 Iron deficiency anemia, unspecified: Secondary | ICD-10-CM | POA: Diagnosis not present

## 2017-12-09 DIAGNOSIS — N186 End stage renal disease: Secondary | ICD-10-CM | POA: Diagnosis not present

## 2017-12-09 DIAGNOSIS — D509 Iron deficiency anemia, unspecified: Secondary | ICD-10-CM | POA: Diagnosis not present

## 2017-12-09 DIAGNOSIS — N2581 Secondary hyperparathyroidism of renal origin: Secondary | ICD-10-CM | POA: Diagnosis not present

## 2017-12-12 DIAGNOSIS — N186 End stage renal disease: Secondary | ICD-10-CM | POA: Diagnosis not present

## 2017-12-12 DIAGNOSIS — N2581 Secondary hyperparathyroidism of renal origin: Secondary | ICD-10-CM | POA: Diagnosis not present

## 2017-12-12 DIAGNOSIS — D509 Iron deficiency anemia, unspecified: Secondary | ICD-10-CM | POA: Diagnosis not present

## 2017-12-14 DIAGNOSIS — D509 Iron deficiency anemia, unspecified: Secondary | ICD-10-CM | POA: Diagnosis not present

## 2017-12-14 DIAGNOSIS — N186 End stage renal disease: Secondary | ICD-10-CM | POA: Diagnosis not present

## 2017-12-14 DIAGNOSIS — N2581 Secondary hyperparathyroidism of renal origin: Secondary | ICD-10-CM | POA: Diagnosis not present

## 2017-12-16 DIAGNOSIS — N2581 Secondary hyperparathyroidism of renal origin: Secondary | ICD-10-CM | POA: Diagnosis not present

## 2017-12-16 DIAGNOSIS — N186 End stage renal disease: Secondary | ICD-10-CM | POA: Diagnosis not present

## 2017-12-16 DIAGNOSIS — D509 Iron deficiency anemia, unspecified: Secondary | ICD-10-CM | POA: Diagnosis not present

## 2017-12-19 DIAGNOSIS — D509 Iron deficiency anemia, unspecified: Secondary | ICD-10-CM | POA: Diagnosis not present

## 2017-12-19 DIAGNOSIS — N2581 Secondary hyperparathyroidism of renal origin: Secondary | ICD-10-CM | POA: Diagnosis not present

## 2017-12-19 DIAGNOSIS — N186 End stage renal disease: Secondary | ICD-10-CM | POA: Diagnosis not present

## 2017-12-19 DIAGNOSIS — I120 Hypertensive chronic kidney disease with stage 5 chronic kidney disease or end stage renal disease: Secondary | ICD-10-CM | POA: Diagnosis not present

## 2017-12-19 DIAGNOSIS — Z992 Dependence on renal dialysis: Secondary | ICD-10-CM | POA: Diagnosis not present

## 2017-12-21 DIAGNOSIS — N186 End stage renal disease: Secondary | ICD-10-CM | POA: Diagnosis not present

## 2017-12-21 DIAGNOSIS — N2581 Secondary hyperparathyroidism of renal origin: Secondary | ICD-10-CM | POA: Diagnosis not present

## 2017-12-21 DIAGNOSIS — D509 Iron deficiency anemia, unspecified: Secondary | ICD-10-CM | POA: Diagnosis not present

## 2017-12-23 DIAGNOSIS — N186 End stage renal disease: Secondary | ICD-10-CM | POA: Diagnosis not present

## 2017-12-23 DIAGNOSIS — N2581 Secondary hyperparathyroidism of renal origin: Secondary | ICD-10-CM | POA: Diagnosis not present

## 2017-12-23 DIAGNOSIS — D509 Iron deficiency anemia, unspecified: Secondary | ICD-10-CM | POA: Diagnosis not present

## 2017-12-26 DIAGNOSIS — N186 End stage renal disease: Secondary | ICD-10-CM | POA: Diagnosis not present

## 2017-12-26 DIAGNOSIS — D509 Iron deficiency anemia, unspecified: Secondary | ICD-10-CM | POA: Diagnosis not present

## 2017-12-26 DIAGNOSIS — N2581 Secondary hyperparathyroidism of renal origin: Secondary | ICD-10-CM | POA: Diagnosis not present

## 2017-12-28 DIAGNOSIS — D509 Iron deficiency anemia, unspecified: Secondary | ICD-10-CM | POA: Diagnosis not present

## 2017-12-28 DIAGNOSIS — N2581 Secondary hyperparathyroidism of renal origin: Secondary | ICD-10-CM | POA: Diagnosis not present

## 2017-12-28 DIAGNOSIS — N186 End stage renal disease: Secondary | ICD-10-CM | POA: Diagnosis not present

## 2017-12-30 DIAGNOSIS — D509 Iron deficiency anemia, unspecified: Secondary | ICD-10-CM | POA: Diagnosis not present

## 2017-12-30 DIAGNOSIS — N186 End stage renal disease: Secondary | ICD-10-CM | POA: Diagnosis not present

## 2017-12-30 DIAGNOSIS — N2581 Secondary hyperparathyroidism of renal origin: Secondary | ICD-10-CM | POA: Diagnosis not present

## 2018-01-02 DIAGNOSIS — D509 Iron deficiency anemia, unspecified: Secondary | ICD-10-CM | POA: Diagnosis not present

## 2018-01-02 DIAGNOSIS — N186 End stage renal disease: Secondary | ICD-10-CM | POA: Diagnosis not present

## 2018-01-02 DIAGNOSIS — N2581 Secondary hyperparathyroidism of renal origin: Secondary | ICD-10-CM | POA: Diagnosis not present

## 2018-01-03 DIAGNOSIS — N186 End stage renal disease: Secondary | ICD-10-CM | POA: Diagnosis not present

## 2018-01-03 DIAGNOSIS — T82858A Stenosis of vascular prosthetic devices, implants and grafts, initial encounter: Secondary | ICD-10-CM | POA: Diagnosis not present

## 2018-01-03 DIAGNOSIS — I871 Compression of vein: Secondary | ICD-10-CM | POA: Diagnosis not present

## 2018-01-03 DIAGNOSIS — Z992 Dependence on renal dialysis: Secondary | ICD-10-CM | POA: Diagnosis not present

## 2018-01-04 DIAGNOSIS — N2581 Secondary hyperparathyroidism of renal origin: Secondary | ICD-10-CM | POA: Diagnosis not present

## 2018-01-04 DIAGNOSIS — D509 Iron deficiency anemia, unspecified: Secondary | ICD-10-CM | POA: Diagnosis not present

## 2018-01-04 DIAGNOSIS — N186 End stage renal disease: Secondary | ICD-10-CM | POA: Diagnosis not present

## 2018-01-06 DIAGNOSIS — N2581 Secondary hyperparathyroidism of renal origin: Secondary | ICD-10-CM | POA: Diagnosis not present

## 2018-01-06 DIAGNOSIS — N186 End stage renal disease: Secondary | ICD-10-CM | POA: Diagnosis not present

## 2018-01-06 DIAGNOSIS — D509 Iron deficiency anemia, unspecified: Secondary | ICD-10-CM | POA: Diagnosis not present

## 2018-01-09 DIAGNOSIS — N186 End stage renal disease: Secondary | ICD-10-CM | POA: Diagnosis not present

## 2018-01-09 DIAGNOSIS — D509 Iron deficiency anemia, unspecified: Secondary | ICD-10-CM | POA: Diagnosis not present

## 2018-01-09 DIAGNOSIS — N2581 Secondary hyperparathyroidism of renal origin: Secondary | ICD-10-CM | POA: Diagnosis not present

## 2018-01-11 DIAGNOSIS — D509 Iron deficiency anemia, unspecified: Secondary | ICD-10-CM | POA: Diagnosis not present

## 2018-01-11 DIAGNOSIS — N2581 Secondary hyperparathyroidism of renal origin: Secondary | ICD-10-CM | POA: Diagnosis not present

## 2018-01-11 DIAGNOSIS — N186 End stage renal disease: Secondary | ICD-10-CM | POA: Diagnosis not present

## 2018-01-13 DIAGNOSIS — D509 Iron deficiency anemia, unspecified: Secondary | ICD-10-CM | POA: Diagnosis not present

## 2018-01-13 DIAGNOSIS — N2581 Secondary hyperparathyroidism of renal origin: Secondary | ICD-10-CM | POA: Diagnosis not present

## 2018-01-13 DIAGNOSIS — N186 End stage renal disease: Secondary | ICD-10-CM | POA: Diagnosis not present

## 2018-01-17 DIAGNOSIS — N186 End stage renal disease: Secondary | ICD-10-CM | POA: Diagnosis not present

## 2018-01-17 DIAGNOSIS — N2581 Secondary hyperparathyroidism of renal origin: Secondary | ICD-10-CM | POA: Diagnosis not present

## 2018-01-17 DIAGNOSIS — D509 Iron deficiency anemia, unspecified: Secondary | ICD-10-CM | POA: Diagnosis not present

## 2018-01-18 DIAGNOSIS — I120 Hypertensive chronic kidney disease with stage 5 chronic kidney disease or end stage renal disease: Secondary | ICD-10-CM | POA: Diagnosis not present

## 2018-01-18 DIAGNOSIS — Z992 Dependence on renal dialysis: Secondary | ICD-10-CM | POA: Diagnosis not present

## 2018-01-18 DIAGNOSIS — D509 Iron deficiency anemia, unspecified: Secondary | ICD-10-CM | POA: Diagnosis not present

## 2018-01-18 DIAGNOSIS — N2581 Secondary hyperparathyroidism of renal origin: Secondary | ICD-10-CM | POA: Diagnosis not present

## 2018-01-18 DIAGNOSIS — N186 End stage renal disease: Secondary | ICD-10-CM | POA: Diagnosis not present

## 2018-01-20 DIAGNOSIS — N186 End stage renal disease: Secondary | ICD-10-CM | POA: Diagnosis not present

## 2018-01-20 DIAGNOSIS — N2581 Secondary hyperparathyroidism of renal origin: Secondary | ICD-10-CM | POA: Diagnosis not present

## 2018-01-20 DIAGNOSIS — D509 Iron deficiency anemia, unspecified: Secondary | ICD-10-CM | POA: Diagnosis not present

## 2018-01-23 DIAGNOSIS — N186 End stage renal disease: Secondary | ICD-10-CM | POA: Diagnosis not present

## 2018-01-23 DIAGNOSIS — D509 Iron deficiency anemia, unspecified: Secondary | ICD-10-CM | POA: Diagnosis not present

## 2018-01-23 DIAGNOSIS — N2581 Secondary hyperparathyroidism of renal origin: Secondary | ICD-10-CM | POA: Diagnosis not present

## 2018-01-25 DIAGNOSIS — N2581 Secondary hyperparathyroidism of renal origin: Secondary | ICD-10-CM | POA: Diagnosis not present

## 2018-01-25 DIAGNOSIS — N186 End stage renal disease: Secondary | ICD-10-CM | POA: Diagnosis not present

## 2018-01-25 DIAGNOSIS — D509 Iron deficiency anemia, unspecified: Secondary | ICD-10-CM | POA: Diagnosis not present

## 2018-01-27 DIAGNOSIS — N2581 Secondary hyperparathyroidism of renal origin: Secondary | ICD-10-CM | POA: Diagnosis not present

## 2018-01-27 DIAGNOSIS — N186 End stage renal disease: Secondary | ICD-10-CM | POA: Diagnosis not present

## 2018-01-27 DIAGNOSIS — D509 Iron deficiency anemia, unspecified: Secondary | ICD-10-CM | POA: Diagnosis not present

## 2018-01-30 DIAGNOSIS — D509 Iron deficiency anemia, unspecified: Secondary | ICD-10-CM | POA: Diagnosis not present

## 2018-01-30 DIAGNOSIS — N186 End stage renal disease: Secondary | ICD-10-CM | POA: Diagnosis not present

## 2018-01-30 DIAGNOSIS — N2581 Secondary hyperparathyroidism of renal origin: Secondary | ICD-10-CM | POA: Diagnosis not present

## 2018-02-01 DIAGNOSIS — N2581 Secondary hyperparathyroidism of renal origin: Secondary | ICD-10-CM | POA: Diagnosis not present

## 2018-02-01 DIAGNOSIS — N186 End stage renal disease: Secondary | ICD-10-CM | POA: Diagnosis not present

## 2018-02-01 DIAGNOSIS — D509 Iron deficiency anemia, unspecified: Secondary | ICD-10-CM | POA: Diagnosis not present

## 2018-02-03 DIAGNOSIS — N2581 Secondary hyperparathyroidism of renal origin: Secondary | ICD-10-CM | POA: Diagnosis not present

## 2018-02-03 DIAGNOSIS — D509 Iron deficiency anemia, unspecified: Secondary | ICD-10-CM | POA: Diagnosis not present

## 2018-02-03 DIAGNOSIS — N186 End stage renal disease: Secondary | ICD-10-CM | POA: Diagnosis not present

## 2018-02-06 DIAGNOSIS — N186 End stage renal disease: Secondary | ICD-10-CM | POA: Diagnosis not present

## 2018-02-06 DIAGNOSIS — N2581 Secondary hyperparathyroidism of renal origin: Secondary | ICD-10-CM | POA: Diagnosis not present

## 2018-02-06 DIAGNOSIS — D509 Iron deficiency anemia, unspecified: Secondary | ICD-10-CM | POA: Diagnosis not present

## 2018-02-08 DIAGNOSIS — N186 End stage renal disease: Secondary | ICD-10-CM | POA: Diagnosis not present

## 2018-02-08 DIAGNOSIS — D509 Iron deficiency anemia, unspecified: Secondary | ICD-10-CM | POA: Diagnosis not present

## 2018-02-08 DIAGNOSIS — N2581 Secondary hyperparathyroidism of renal origin: Secondary | ICD-10-CM | POA: Diagnosis not present

## 2018-02-10 DIAGNOSIS — N186 End stage renal disease: Secondary | ICD-10-CM | POA: Diagnosis not present

## 2018-02-10 DIAGNOSIS — D509 Iron deficiency anemia, unspecified: Secondary | ICD-10-CM | POA: Diagnosis not present

## 2018-02-10 DIAGNOSIS — N2581 Secondary hyperparathyroidism of renal origin: Secondary | ICD-10-CM | POA: Diagnosis not present

## 2018-02-13 DIAGNOSIS — D509 Iron deficiency anemia, unspecified: Secondary | ICD-10-CM | POA: Diagnosis not present

## 2018-02-13 DIAGNOSIS — N2581 Secondary hyperparathyroidism of renal origin: Secondary | ICD-10-CM | POA: Diagnosis not present

## 2018-02-13 DIAGNOSIS — N186 End stage renal disease: Secondary | ICD-10-CM | POA: Diagnosis not present

## 2018-02-15 DIAGNOSIS — N2581 Secondary hyperparathyroidism of renal origin: Secondary | ICD-10-CM | POA: Diagnosis not present

## 2018-02-15 DIAGNOSIS — N186 End stage renal disease: Secondary | ICD-10-CM | POA: Diagnosis not present

## 2018-02-15 DIAGNOSIS — D509 Iron deficiency anemia, unspecified: Secondary | ICD-10-CM | POA: Diagnosis not present

## 2018-02-17 DIAGNOSIS — N2581 Secondary hyperparathyroidism of renal origin: Secondary | ICD-10-CM | POA: Diagnosis not present

## 2018-02-17 DIAGNOSIS — D509 Iron deficiency anemia, unspecified: Secondary | ICD-10-CM | POA: Diagnosis not present

## 2018-02-17 DIAGNOSIS — N186 End stage renal disease: Secondary | ICD-10-CM | POA: Diagnosis not present

## 2018-02-18 DIAGNOSIS — Z992 Dependence on renal dialysis: Secondary | ICD-10-CM | POA: Diagnosis not present

## 2018-02-18 DIAGNOSIS — I120 Hypertensive chronic kidney disease with stage 5 chronic kidney disease or end stage renal disease: Secondary | ICD-10-CM | POA: Diagnosis not present

## 2018-02-18 DIAGNOSIS — N186 End stage renal disease: Secondary | ICD-10-CM | POA: Diagnosis not present

## 2018-02-20 DIAGNOSIS — N186 End stage renal disease: Secondary | ICD-10-CM | POA: Diagnosis not present

## 2018-02-20 DIAGNOSIS — N2581 Secondary hyperparathyroidism of renal origin: Secondary | ICD-10-CM | POA: Diagnosis not present

## 2018-02-20 DIAGNOSIS — D509 Iron deficiency anemia, unspecified: Secondary | ICD-10-CM | POA: Diagnosis not present

## 2018-02-22 DIAGNOSIS — D509 Iron deficiency anemia, unspecified: Secondary | ICD-10-CM | POA: Diagnosis not present

## 2018-02-22 DIAGNOSIS — N2581 Secondary hyperparathyroidism of renal origin: Secondary | ICD-10-CM | POA: Diagnosis not present

## 2018-02-22 DIAGNOSIS — N186 End stage renal disease: Secondary | ICD-10-CM | POA: Diagnosis not present

## 2018-02-24 DIAGNOSIS — N2581 Secondary hyperparathyroidism of renal origin: Secondary | ICD-10-CM | POA: Diagnosis not present

## 2018-02-24 DIAGNOSIS — N186 End stage renal disease: Secondary | ICD-10-CM | POA: Diagnosis not present

## 2018-02-24 DIAGNOSIS — D509 Iron deficiency anemia, unspecified: Secondary | ICD-10-CM | POA: Diagnosis not present

## 2018-02-27 DIAGNOSIS — N186 End stage renal disease: Secondary | ICD-10-CM | POA: Diagnosis not present

## 2018-02-27 DIAGNOSIS — D509 Iron deficiency anemia, unspecified: Secondary | ICD-10-CM | POA: Diagnosis not present

## 2018-02-27 DIAGNOSIS — N2581 Secondary hyperparathyroidism of renal origin: Secondary | ICD-10-CM | POA: Diagnosis not present

## 2018-03-01 DIAGNOSIS — N186 End stage renal disease: Secondary | ICD-10-CM | POA: Diagnosis not present

## 2018-03-01 DIAGNOSIS — D509 Iron deficiency anemia, unspecified: Secondary | ICD-10-CM | POA: Diagnosis not present

## 2018-03-01 DIAGNOSIS — N2581 Secondary hyperparathyroidism of renal origin: Secondary | ICD-10-CM | POA: Diagnosis not present

## 2018-03-03 DIAGNOSIS — D509 Iron deficiency anemia, unspecified: Secondary | ICD-10-CM | POA: Diagnosis not present

## 2018-03-03 DIAGNOSIS — N186 End stage renal disease: Secondary | ICD-10-CM | POA: Diagnosis not present

## 2018-03-03 DIAGNOSIS — N2581 Secondary hyperparathyroidism of renal origin: Secondary | ICD-10-CM | POA: Diagnosis not present

## 2018-03-06 DIAGNOSIS — N2581 Secondary hyperparathyroidism of renal origin: Secondary | ICD-10-CM | POA: Diagnosis not present

## 2018-03-06 DIAGNOSIS — D509 Iron deficiency anemia, unspecified: Secondary | ICD-10-CM | POA: Diagnosis not present

## 2018-03-06 DIAGNOSIS — N186 End stage renal disease: Secondary | ICD-10-CM | POA: Diagnosis not present

## 2018-03-07 DIAGNOSIS — M7052 Other bursitis of knee, left knee: Secondary | ICD-10-CM | POA: Diagnosis not present

## 2018-03-07 DIAGNOSIS — M6281 Muscle weakness (generalized): Secondary | ICD-10-CM | POA: Diagnosis not present

## 2018-03-07 DIAGNOSIS — M25562 Pain in left knee: Secondary | ICD-10-CM | POA: Diagnosis not present

## 2018-03-08 DIAGNOSIS — N186 End stage renal disease: Secondary | ICD-10-CM | POA: Diagnosis not present

## 2018-03-08 DIAGNOSIS — N2581 Secondary hyperparathyroidism of renal origin: Secondary | ICD-10-CM | POA: Diagnosis not present

## 2018-03-08 DIAGNOSIS — D509 Iron deficiency anemia, unspecified: Secondary | ICD-10-CM | POA: Diagnosis not present

## 2018-03-10 DIAGNOSIS — D509 Iron deficiency anemia, unspecified: Secondary | ICD-10-CM | POA: Diagnosis not present

## 2018-03-10 DIAGNOSIS — N186 End stage renal disease: Secondary | ICD-10-CM | POA: Diagnosis not present

## 2018-03-10 DIAGNOSIS — N2581 Secondary hyperparathyroidism of renal origin: Secondary | ICD-10-CM | POA: Diagnosis not present

## 2018-03-13 DIAGNOSIS — N2581 Secondary hyperparathyroidism of renal origin: Secondary | ICD-10-CM | POA: Diagnosis not present

## 2018-03-13 DIAGNOSIS — N186 End stage renal disease: Secondary | ICD-10-CM | POA: Diagnosis not present

## 2018-03-13 DIAGNOSIS — D509 Iron deficiency anemia, unspecified: Secondary | ICD-10-CM | POA: Diagnosis not present

## 2018-03-15 DIAGNOSIS — N186 End stage renal disease: Secondary | ICD-10-CM | POA: Diagnosis not present

## 2018-03-15 DIAGNOSIS — N2581 Secondary hyperparathyroidism of renal origin: Secondary | ICD-10-CM | POA: Diagnosis not present

## 2018-03-15 DIAGNOSIS — D509 Iron deficiency anemia, unspecified: Secondary | ICD-10-CM | POA: Diagnosis not present

## 2018-03-16 DIAGNOSIS — N186 End stage renal disease: Secondary | ICD-10-CM | POA: Diagnosis not present

## 2018-03-16 DIAGNOSIS — T82858A Stenosis of vascular prosthetic devices, implants and grafts, initial encounter: Secondary | ICD-10-CM | POA: Diagnosis not present

## 2018-03-16 DIAGNOSIS — I871 Compression of vein: Secondary | ICD-10-CM | POA: Diagnosis not present

## 2018-03-16 DIAGNOSIS — Z992 Dependence on renal dialysis: Secondary | ICD-10-CM | POA: Diagnosis not present

## 2018-03-17 DIAGNOSIS — D509 Iron deficiency anemia, unspecified: Secondary | ICD-10-CM | POA: Diagnosis not present

## 2018-03-17 DIAGNOSIS — N186 End stage renal disease: Secondary | ICD-10-CM | POA: Diagnosis not present

## 2018-03-17 DIAGNOSIS — N2581 Secondary hyperparathyroidism of renal origin: Secondary | ICD-10-CM | POA: Diagnosis not present

## 2018-03-20 DIAGNOSIS — N186 End stage renal disease: Secondary | ICD-10-CM | POA: Diagnosis not present

## 2018-03-20 DIAGNOSIS — I120 Hypertensive chronic kidney disease with stage 5 chronic kidney disease or end stage renal disease: Secondary | ICD-10-CM | POA: Diagnosis not present

## 2018-03-20 DIAGNOSIS — Z992 Dependence on renal dialysis: Secondary | ICD-10-CM | POA: Diagnosis not present

## 2018-03-21 DIAGNOSIS — N186 End stage renal disease: Secondary | ICD-10-CM | POA: Diagnosis not present

## 2018-03-21 DIAGNOSIS — Z992 Dependence on renal dialysis: Secondary | ICD-10-CM | POA: Diagnosis not present

## 2018-03-22 DIAGNOSIS — Z992 Dependence on renal dialysis: Secondary | ICD-10-CM | POA: Diagnosis not present

## 2018-03-22 DIAGNOSIS — N186 End stage renal disease: Secondary | ICD-10-CM | POA: Diagnosis not present

## 2018-03-23 DIAGNOSIS — N186 End stage renal disease: Secondary | ICD-10-CM | POA: Diagnosis not present

## 2018-03-23 DIAGNOSIS — Z992 Dependence on renal dialysis: Secondary | ICD-10-CM | POA: Diagnosis not present

## 2018-03-24 DIAGNOSIS — N186 End stage renal disease: Secondary | ICD-10-CM | POA: Diagnosis not present

## 2018-03-24 DIAGNOSIS — Z992 Dependence on renal dialysis: Secondary | ICD-10-CM | POA: Diagnosis not present

## 2018-03-25 DIAGNOSIS — Z992 Dependence on renal dialysis: Secondary | ICD-10-CM | POA: Diagnosis not present

## 2018-03-25 DIAGNOSIS — N186 End stage renal disease: Secondary | ICD-10-CM | POA: Diagnosis not present

## 2018-03-26 DIAGNOSIS — N186 End stage renal disease: Secondary | ICD-10-CM | POA: Diagnosis not present

## 2018-03-26 DIAGNOSIS — Z992 Dependence on renal dialysis: Secondary | ICD-10-CM | POA: Diagnosis not present

## 2018-03-27 DIAGNOSIS — N186 End stage renal disease: Secondary | ICD-10-CM | POA: Diagnosis not present

## 2018-03-27 DIAGNOSIS — Z992 Dependence on renal dialysis: Secondary | ICD-10-CM | POA: Diagnosis not present

## 2018-03-28 DIAGNOSIS — Z992 Dependence on renal dialysis: Secondary | ICD-10-CM | POA: Diagnosis not present

## 2018-03-28 DIAGNOSIS — N186 End stage renal disease: Secondary | ICD-10-CM | POA: Diagnosis not present

## 2018-03-29 DIAGNOSIS — Z992 Dependence on renal dialysis: Secondary | ICD-10-CM | POA: Diagnosis not present

## 2018-03-29 DIAGNOSIS — N186 End stage renal disease: Secondary | ICD-10-CM | POA: Diagnosis not present

## 2018-03-30 DIAGNOSIS — N186 End stage renal disease: Secondary | ICD-10-CM | POA: Diagnosis not present

## 2018-03-30 DIAGNOSIS — Z992 Dependence on renal dialysis: Secondary | ICD-10-CM | POA: Diagnosis not present

## 2018-03-31 DIAGNOSIS — N186 End stage renal disease: Secondary | ICD-10-CM | POA: Diagnosis not present

## 2018-03-31 DIAGNOSIS — Z992 Dependence on renal dialysis: Secondary | ICD-10-CM | POA: Diagnosis not present

## 2018-04-01 DIAGNOSIS — Z992 Dependence on renal dialysis: Secondary | ICD-10-CM | POA: Diagnosis not present

## 2018-04-01 DIAGNOSIS — N186 End stage renal disease: Secondary | ICD-10-CM | POA: Diagnosis not present

## 2018-04-02 DIAGNOSIS — N186 End stage renal disease: Secondary | ICD-10-CM | POA: Diagnosis not present

## 2018-04-02 DIAGNOSIS — Z992 Dependence on renal dialysis: Secondary | ICD-10-CM | POA: Diagnosis not present

## 2018-04-03 DIAGNOSIS — Z992 Dependence on renal dialysis: Secondary | ICD-10-CM | POA: Diagnosis not present

## 2018-04-03 DIAGNOSIS — N186 End stage renal disease: Secondary | ICD-10-CM | POA: Diagnosis not present

## 2018-04-04 DIAGNOSIS — N186 End stage renal disease: Secondary | ICD-10-CM | POA: Diagnosis not present

## 2018-04-04 DIAGNOSIS — Z992 Dependence on renal dialysis: Secondary | ICD-10-CM | POA: Diagnosis not present

## 2018-04-05 DIAGNOSIS — Z992 Dependence on renal dialysis: Secondary | ICD-10-CM | POA: Diagnosis not present

## 2018-04-05 DIAGNOSIS — N186 End stage renal disease: Secondary | ICD-10-CM | POA: Diagnosis not present

## 2018-04-06 DIAGNOSIS — N186 End stage renal disease: Secondary | ICD-10-CM | POA: Diagnosis not present

## 2018-04-06 DIAGNOSIS — Z992 Dependence on renal dialysis: Secondary | ICD-10-CM | POA: Diagnosis not present

## 2018-04-07 DIAGNOSIS — Z992 Dependence on renal dialysis: Secondary | ICD-10-CM | POA: Diagnosis not present

## 2018-04-07 DIAGNOSIS — N186 End stage renal disease: Secondary | ICD-10-CM | POA: Diagnosis not present

## 2018-04-08 DIAGNOSIS — Z992 Dependence on renal dialysis: Secondary | ICD-10-CM | POA: Diagnosis not present

## 2018-04-08 DIAGNOSIS — N186 End stage renal disease: Secondary | ICD-10-CM | POA: Diagnosis not present

## 2018-04-09 DIAGNOSIS — Z992 Dependence on renal dialysis: Secondary | ICD-10-CM | POA: Diagnosis not present

## 2018-04-09 DIAGNOSIS — N186 End stage renal disease: Secondary | ICD-10-CM | POA: Diagnosis not present

## 2018-04-10 DIAGNOSIS — N186 End stage renal disease: Secondary | ICD-10-CM | POA: Diagnosis not present

## 2018-04-10 DIAGNOSIS — Z992 Dependence on renal dialysis: Secondary | ICD-10-CM | POA: Diagnosis not present

## 2018-04-11 DIAGNOSIS — N186 End stage renal disease: Secondary | ICD-10-CM | POA: Diagnosis not present

## 2018-04-11 DIAGNOSIS — Z992 Dependence on renal dialysis: Secondary | ICD-10-CM | POA: Diagnosis not present

## 2018-04-12 DIAGNOSIS — N186 End stage renal disease: Secondary | ICD-10-CM | POA: Diagnosis not present

## 2018-04-12 DIAGNOSIS — Z992 Dependence on renal dialysis: Secondary | ICD-10-CM | POA: Diagnosis not present

## 2018-04-13 DIAGNOSIS — N186 End stage renal disease: Secondary | ICD-10-CM | POA: Diagnosis not present

## 2018-04-13 DIAGNOSIS — Z992 Dependence on renal dialysis: Secondary | ICD-10-CM | POA: Diagnosis not present

## 2018-04-14 DIAGNOSIS — N2581 Secondary hyperparathyroidism of renal origin: Secondary | ICD-10-CM | POA: Diagnosis not present

## 2018-04-14 DIAGNOSIS — N186 End stage renal disease: Secondary | ICD-10-CM | POA: Diagnosis not present

## 2018-04-14 DIAGNOSIS — Z992 Dependence on renal dialysis: Secondary | ICD-10-CM | POA: Diagnosis not present

## 2018-04-14 DIAGNOSIS — D509 Iron deficiency anemia, unspecified: Secondary | ICD-10-CM | POA: Diagnosis not present

## 2018-04-17 DIAGNOSIS — N186 End stage renal disease: Secondary | ICD-10-CM | POA: Diagnosis not present

## 2018-04-17 DIAGNOSIS — N2581 Secondary hyperparathyroidism of renal origin: Secondary | ICD-10-CM | POA: Diagnosis not present

## 2018-04-17 DIAGNOSIS — D509 Iron deficiency anemia, unspecified: Secondary | ICD-10-CM | POA: Diagnosis not present

## 2018-04-18 ENCOUNTER — Other Ambulatory Visit: Payer: Self-pay | Admitting: Nephrology

## 2018-04-18 DIAGNOSIS — Z1231 Encounter for screening mammogram for malignant neoplasm of breast: Secondary | ICD-10-CM

## 2018-04-19 DIAGNOSIS — N186 End stage renal disease: Secondary | ICD-10-CM | POA: Diagnosis not present

## 2018-04-19 DIAGNOSIS — D509 Iron deficiency anemia, unspecified: Secondary | ICD-10-CM | POA: Diagnosis not present

## 2018-04-19 DIAGNOSIS — N2581 Secondary hyperparathyroidism of renal origin: Secondary | ICD-10-CM | POA: Diagnosis not present

## 2018-04-20 DIAGNOSIS — I120 Hypertensive chronic kidney disease with stage 5 chronic kidney disease or end stage renal disease: Secondary | ICD-10-CM | POA: Diagnosis not present

## 2018-04-20 DIAGNOSIS — N186 End stage renal disease: Secondary | ICD-10-CM | POA: Diagnosis not present

## 2018-04-20 DIAGNOSIS — Z992 Dependence on renal dialysis: Secondary | ICD-10-CM | POA: Diagnosis not present

## 2018-04-21 DIAGNOSIS — D509 Iron deficiency anemia, unspecified: Secondary | ICD-10-CM | POA: Diagnosis not present

## 2018-04-21 DIAGNOSIS — N2581 Secondary hyperparathyroidism of renal origin: Secondary | ICD-10-CM | POA: Diagnosis not present

## 2018-04-21 DIAGNOSIS — N186 End stage renal disease: Secondary | ICD-10-CM | POA: Diagnosis not present

## 2018-04-24 DIAGNOSIS — N186 End stage renal disease: Secondary | ICD-10-CM | POA: Diagnosis not present

## 2018-04-24 DIAGNOSIS — D509 Iron deficiency anemia, unspecified: Secondary | ICD-10-CM | POA: Diagnosis not present

## 2018-04-24 DIAGNOSIS — N2581 Secondary hyperparathyroidism of renal origin: Secondary | ICD-10-CM | POA: Diagnosis not present

## 2018-04-26 DIAGNOSIS — N186 End stage renal disease: Secondary | ICD-10-CM | POA: Diagnosis not present

## 2018-04-26 DIAGNOSIS — D509 Iron deficiency anemia, unspecified: Secondary | ICD-10-CM | POA: Diagnosis not present

## 2018-04-26 DIAGNOSIS — N2581 Secondary hyperparathyroidism of renal origin: Secondary | ICD-10-CM | POA: Diagnosis not present

## 2018-04-28 DIAGNOSIS — D509 Iron deficiency anemia, unspecified: Secondary | ICD-10-CM | POA: Diagnosis not present

## 2018-04-28 DIAGNOSIS — N2581 Secondary hyperparathyroidism of renal origin: Secondary | ICD-10-CM | POA: Diagnosis not present

## 2018-04-28 DIAGNOSIS — N186 End stage renal disease: Secondary | ICD-10-CM | POA: Diagnosis not present

## 2018-05-01 DIAGNOSIS — D509 Iron deficiency anemia, unspecified: Secondary | ICD-10-CM | POA: Diagnosis not present

## 2018-05-01 DIAGNOSIS — N2581 Secondary hyperparathyroidism of renal origin: Secondary | ICD-10-CM | POA: Diagnosis not present

## 2018-05-01 DIAGNOSIS — N186 End stage renal disease: Secondary | ICD-10-CM | POA: Diagnosis not present

## 2018-05-03 DIAGNOSIS — N2581 Secondary hyperparathyroidism of renal origin: Secondary | ICD-10-CM | POA: Diagnosis not present

## 2018-05-03 DIAGNOSIS — N186 End stage renal disease: Secondary | ICD-10-CM | POA: Diagnosis not present

## 2018-05-03 DIAGNOSIS — D509 Iron deficiency anemia, unspecified: Secondary | ICD-10-CM | POA: Diagnosis not present

## 2018-05-05 DIAGNOSIS — N186 End stage renal disease: Secondary | ICD-10-CM | POA: Diagnosis not present

## 2018-05-05 DIAGNOSIS — D509 Iron deficiency anemia, unspecified: Secondary | ICD-10-CM | POA: Diagnosis not present

## 2018-05-05 DIAGNOSIS — N2581 Secondary hyperparathyroidism of renal origin: Secondary | ICD-10-CM | POA: Diagnosis not present

## 2018-05-08 DIAGNOSIS — D509 Iron deficiency anemia, unspecified: Secondary | ICD-10-CM | POA: Diagnosis not present

## 2018-05-08 DIAGNOSIS — N2581 Secondary hyperparathyroidism of renal origin: Secondary | ICD-10-CM | POA: Diagnosis not present

## 2018-05-08 DIAGNOSIS — N186 End stage renal disease: Secondary | ICD-10-CM | POA: Diagnosis not present

## 2018-05-10 DIAGNOSIS — D509 Iron deficiency anemia, unspecified: Secondary | ICD-10-CM | POA: Diagnosis not present

## 2018-05-10 DIAGNOSIS — N186 End stage renal disease: Secondary | ICD-10-CM | POA: Diagnosis not present

## 2018-05-10 DIAGNOSIS — N2581 Secondary hyperparathyroidism of renal origin: Secondary | ICD-10-CM | POA: Diagnosis not present

## 2018-05-11 DIAGNOSIS — Z01419 Encounter for gynecological examination (general) (routine) without abnormal findings: Secondary | ICD-10-CM | POA: Diagnosis not present

## 2018-05-11 DIAGNOSIS — Z6836 Body mass index (BMI) 36.0-36.9, adult: Secondary | ICD-10-CM | POA: Diagnosis not present

## 2018-05-12 ENCOUNTER — Ambulatory Visit
Admission: RE | Admit: 2018-05-12 | Discharge: 2018-05-12 | Disposition: A | Discharge status: Home / Self Care | Payer: Medicare Other | Source: Ambulatory Visit | Attending: Nephrology | Admitting: Nephrology

## 2018-05-12 DIAGNOSIS — N2581 Secondary hyperparathyroidism of renal origin: Secondary | ICD-10-CM | POA: Diagnosis not present

## 2018-05-12 DIAGNOSIS — N186 End stage renal disease: Secondary | ICD-10-CM | POA: Diagnosis not present

## 2018-05-12 DIAGNOSIS — Z1231 Encounter for screening mammogram for malignant neoplasm of breast: Secondary | ICD-10-CM | POA: Diagnosis not present

## 2018-05-12 DIAGNOSIS — D509 Iron deficiency anemia, unspecified: Secondary | ICD-10-CM | POA: Diagnosis not present

## 2018-05-15 DIAGNOSIS — D509 Iron deficiency anemia, unspecified: Secondary | ICD-10-CM | POA: Diagnosis not present

## 2018-05-15 DIAGNOSIS — N186 End stage renal disease: Secondary | ICD-10-CM | POA: Diagnosis not present

## 2018-05-15 DIAGNOSIS — N2581 Secondary hyperparathyroidism of renal origin: Secondary | ICD-10-CM | POA: Diagnosis not present

## 2018-05-17 DIAGNOSIS — N186 End stage renal disease: Secondary | ICD-10-CM | POA: Diagnosis not present

## 2018-05-17 DIAGNOSIS — D509 Iron deficiency anemia, unspecified: Secondary | ICD-10-CM | POA: Diagnosis not present

## 2018-05-17 DIAGNOSIS — N2581 Secondary hyperparathyroidism of renal origin: Secondary | ICD-10-CM | POA: Diagnosis not present

## 2018-05-19 DIAGNOSIS — N186 End stage renal disease: Secondary | ICD-10-CM | POA: Diagnosis not present

## 2018-05-19 DIAGNOSIS — N2581 Secondary hyperparathyroidism of renal origin: Secondary | ICD-10-CM | POA: Diagnosis not present

## 2018-05-19 DIAGNOSIS — D509 Iron deficiency anemia, unspecified: Secondary | ICD-10-CM | POA: Diagnosis not present

## 2018-05-21 DIAGNOSIS — Z992 Dependence on renal dialysis: Secondary | ICD-10-CM | POA: Diagnosis not present

## 2018-05-21 DIAGNOSIS — N186 End stage renal disease: Secondary | ICD-10-CM | POA: Diagnosis not present

## 2018-05-21 DIAGNOSIS — I120 Hypertensive chronic kidney disease with stage 5 chronic kidney disease or end stage renal disease: Secondary | ICD-10-CM | POA: Diagnosis not present

## 2018-05-22 DIAGNOSIS — D509 Iron deficiency anemia, unspecified: Secondary | ICD-10-CM | POA: Diagnosis not present

## 2018-05-22 DIAGNOSIS — N186 End stage renal disease: Secondary | ICD-10-CM | POA: Diagnosis not present

## 2018-05-22 DIAGNOSIS — N2581 Secondary hyperparathyroidism of renal origin: Secondary | ICD-10-CM | POA: Diagnosis not present

## 2018-05-22 DIAGNOSIS — Z23 Encounter for immunization: Secondary | ICD-10-CM | POA: Diagnosis not present

## 2018-05-24 DIAGNOSIS — N2581 Secondary hyperparathyroidism of renal origin: Secondary | ICD-10-CM | POA: Diagnosis not present

## 2018-05-24 DIAGNOSIS — N186 End stage renal disease: Secondary | ICD-10-CM | POA: Diagnosis not present

## 2018-05-24 DIAGNOSIS — Z23 Encounter for immunization: Secondary | ICD-10-CM | POA: Diagnosis not present

## 2018-05-24 DIAGNOSIS — D509 Iron deficiency anemia, unspecified: Secondary | ICD-10-CM | POA: Diagnosis not present

## 2018-05-26 DIAGNOSIS — Z23 Encounter for immunization: Secondary | ICD-10-CM | POA: Diagnosis not present

## 2018-05-26 DIAGNOSIS — D509 Iron deficiency anemia, unspecified: Secondary | ICD-10-CM | POA: Diagnosis not present

## 2018-05-26 DIAGNOSIS — N2581 Secondary hyperparathyroidism of renal origin: Secondary | ICD-10-CM | POA: Diagnosis not present

## 2018-05-26 DIAGNOSIS — N186 End stage renal disease: Secondary | ICD-10-CM | POA: Diagnosis not present

## 2018-05-29 DIAGNOSIS — N2581 Secondary hyperparathyroidism of renal origin: Secondary | ICD-10-CM | POA: Diagnosis not present

## 2018-05-29 DIAGNOSIS — N186 End stage renal disease: Secondary | ICD-10-CM | POA: Diagnosis not present

## 2018-05-29 DIAGNOSIS — Z23 Encounter for immunization: Secondary | ICD-10-CM | POA: Diagnosis not present

## 2018-05-29 DIAGNOSIS — D509 Iron deficiency anemia, unspecified: Secondary | ICD-10-CM | POA: Diagnosis not present

## 2018-05-31 DIAGNOSIS — D509 Iron deficiency anemia, unspecified: Secondary | ICD-10-CM | POA: Diagnosis not present

## 2018-05-31 DIAGNOSIS — Z23 Encounter for immunization: Secondary | ICD-10-CM | POA: Diagnosis not present

## 2018-05-31 DIAGNOSIS — N186 End stage renal disease: Secondary | ICD-10-CM | POA: Diagnosis not present

## 2018-05-31 DIAGNOSIS — N2581 Secondary hyperparathyroidism of renal origin: Secondary | ICD-10-CM | POA: Diagnosis not present

## 2018-06-02 DIAGNOSIS — N2581 Secondary hyperparathyroidism of renal origin: Secondary | ICD-10-CM | POA: Diagnosis not present

## 2018-06-02 DIAGNOSIS — Z23 Encounter for immunization: Secondary | ICD-10-CM | POA: Diagnosis not present

## 2018-06-02 DIAGNOSIS — D509 Iron deficiency anemia, unspecified: Secondary | ICD-10-CM | POA: Diagnosis not present

## 2018-06-02 DIAGNOSIS — N186 End stage renal disease: Secondary | ICD-10-CM | POA: Diagnosis not present

## 2018-06-05 DIAGNOSIS — D509 Iron deficiency anemia, unspecified: Secondary | ICD-10-CM | POA: Diagnosis not present

## 2018-06-05 DIAGNOSIS — Z23 Encounter for immunization: Secondary | ICD-10-CM | POA: Diagnosis not present

## 2018-06-05 DIAGNOSIS — N186 End stage renal disease: Secondary | ICD-10-CM | POA: Diagnosis not present

## 2018-06-05 DIAGNOSIS — N2581 Secondary hyperparathyroidism of renal origin: Secondary | ICD-10-CM | POA: Diagnosis not present

## 2018-06-07 DIAGNOSIS — N186 End stage renal disease: Secondary | ICD-10-CM | POA: Diagnosis not present

## 2018-06-07 DIAGNOSIS — D509 Iron deficiency anemia, unspecified: Secondary | ICD-10-CM | POA: Diagnosis not present

## 2018-06-07 DIAGNOSIS — N2581 Secondary hyperparathyroidism of renal origin: Secondary | ICD-10-CM | POA: Diagnosis not present

## 2018-06-07 DIAGNOSIS — Z23 Encounter for immunization: Secondary | ICD-10-CM | POA: Diagnosis not present

## 2018-06-09 DIAGNOSIS — D509 Iron deficiency anemia, unspecified: Secondary | ICD-10-CM | POA: Diagnosis not present

## 2018-06-09 DIAGNOSIS — N186 End stage renal disease: Secondary | ICD-10-CM | POA: Diagnosis not present

## 2018-06-09 DIAGNOSIS — N2581 Secondary hyperparathyroidism of renal origin: Secondary | ICD-10-CM | POA: Diagnosis not present

## 2018-06-09 DIAGNOSIS — Z23 Encounter for immunization: Secondary | ICD-10-CM | POA: Diagnosis not present

## 2018-06-12 DIAGNOSIS — D509 Iron deficiency anemia, unspecified: Secondary | ICD-10-CM | POA: Diagnosis not present

## 2018-06-12 DIAGNOSIS — N186 End stage renal disease: Secondary | ICD-10-CM | POA: Diagnosis not present

## 2018-06-12 DIAGNOSIS — Z23 Encounter for immunization: Secondary | ICD-10-CM | POA: Diagnosis not present

## 2018-06-12 DIAGNOSIS — N2581 Secondary hyperparathyroidism of renal origin: Secondary | ICD-10-CM | POA: Diagnosis not present

## 2018-06-14 DIAGNOSIS — N186 End stage renal disease: Secondary | ICD-10-CM | POA: Diagnosis not present

## 2018-06-14 DIAGNOSIS — Z23 Encounter for immunization: Secondary | ICD-10-CM | POA: Diagnosis not present

## 2018-06-14 DIAGNOSIS — D509 Iron deficiency anemia, unspecified: Secondary | ICD-10-CM | POA: Diagnosis not present

## 2018-06-14 DIAGNOSIS — N2581 Secondary hyperparathyroidism of renal origin: Secondary | ICD-10-CM | POA: Diagnosis not present

## 2018-06-16 DIAGNOSIS — N2581 Secondary hyperparathyroidism of renal origin: Secondary | ICD-10-CM | POA: Diagnosis not present

## 2018-06-16 DIAGNOSIS — D509 Iron deficiency anemia, unspecified: Secondary | ICD-10-CM | POA: Diagnosis not present

## 2018-06-16 DIAGNOSIS — N186 End stage renal disease: Secondary | ICD-10-CM | POA: Diagnosis not present

## 2018-06-16 DIAGNOSIS — Z23 Encounter for immunization: Secondary | ICD-10-CM | POA: Diagnosis not present

## 2018-06-19 DIAGNOSIS — N2581 Secondary hyperparathyroidism of renal origin: Secondary | ICD-10-CM | POA: Diagnosis not present

## 2018-06-19 DIAGNOSIS — D509 Iron deficiency anemia, unspecified: Secondary | ICD-10-CM | POA: Diagnosis not present

## 2018-06-19 DIAGNOSIS — Z23 Encounter for immunization: Secondary | ICD-10-CM | POA: Diagnosis not present

## 2018-06-19 DIAGNOSIS — N186 End stage renal disease: Secondary | ICD-10-CM | POA: Diagnosis not present

## 2018-06-20 DIAGNOSIS — I120 Hypertensive chronic kidney disease with stage 5 chronic kidney disease or end stage renal disease: Secondary | ICD-10-CM | POA: Diagnosis not present

## 2018-06-20 DIAGNOSIS — Z992 Dependence on renal dialysis: Secondary | ICD-10-CM | POA: Diagnosis not present

## 2018-06-20 DIAGNOSIS — N186 End stage renal disease: Secondary | ICD-10-CM | POA: Diagnosis not present

## 2018-06-21 DIAGNOSIS — N2581 Secondary hyperparathyroidism of renal origin: Secondary | ICD-10-CM | POA: Diagnosis not present

## 2018-06-21 DIAGNOSIS — N186 End stage renal disease: Secondary | ICD-10-CM | POA: Diagnosis not present

## 2018-06-21 DIAGNOSIS — D509 Iron deficiency anemia, unspecified: Secondary | ICD-10-CM | POA: Diagnosis not present

## 2018-06-23 DIAGNOSIS — D509 Iron deficiency anemia, unspecified: Secondary | ICD-10-CM | POA: Diagnosis not present

## 2018-06-23 DIAGNOSIS — N2581 Secondary hyperparathyroidism of renal origin: Secondary | ICD-10-CM | POA: Diagnosis not present

## 2018-06-23 DIAGNOSIS — N186 End stage renal disease: Secondary | ICD-10-CM | POA: Diagnosis not present

## 2018-06-26 DIAGNOSIS — D509 Iron deficiency anemia, unspecified: Secondary | ICD-10-CM | POA: Diagnosis not present

## 2018-06-26 DIAGNOSIS — N2581 Secondary hyperparathyroidism of renal origin: Secondary | ICD-10-CM | POA: Diagnosis not present

## 2018-06-26 DIAGNOSIS — N186 End stage renal disease: Secondary | ICD-10-CM | POA: Diagnosis not present

## 2018-06-27 ENCOUNTER — Emergency Department (HOSPITAL_COMMUNITY)
Admission: EM | Admit: 2018-06-27 | Discharge: 2018-06-27 | Disposition: A | Discharge status: Home / Self Care | Payer: Medicare Other | Attending: Emergency Medicine | Admitting: Emergency Medicine

## 2018-06-27 ENCOUNTER — Emergency Department (HOSPITAL_COMMUNITY): Payer: Medicare Other

## 2018-06-27 ENCOUNTER — Encounter (HOSPITAL_COMMUNITY): Payer: Self-pay

## 2018-06-27 ENCOUNTER — Other Ambulatory Visit: Payer: Self-pay

## 2018-06-27 DIAGNOSIS — N186 End stage renal disease: Secondary | ICD-10-CM | POA: Insufficient documentation

## 2018-06-27 DIAGNOSIS — J302 Other seasonal allergic rhinitis: Secondary | ICD-10-CM | POA: Insufficient documentation

## 2018-06-27 DIAGNOSIS — R51 Headache: Secondary | ICD-10-CM | POA: Diagnosis not present

## 2018-06-27 DIAGNOSIS — R519 Headache, unspecified: Secondary | ICD-10-CM

## 2018-06-27 DIAGNOSIS — E213 Hyperparathyroidism, unspecified: Secondary | ICD-10-CM | POA: Diagnosis not present

## 2018-06-27 DIAGNOSIS — I120 Hypertensive chronic kidney disease with stage 5 chronic kidney disease or end stage renal disease: Secondary | ICD-10-CM | POA: Diagnosis not present

## 2018-06-27 DIAGNOSIS — Z79899 Other long term (current) drug therapy: Secondary | ICD-10-CM | POA: Diagnosis not present

## 2018-06-27 DIAGNOSIS — I493 Ventricular premature depolarization: Secondary | ICD-10-CM | POA: Diagnosis not present

## 2018-06-27 NOTE — Discharge Instructions (Signed)
The CT scan of your head shows mild sinus congestion.  Take Benadryl 25 to 50 mg every 6 hours as needed for sinus congestion.  Can also take Tylenol as needed  650 mg every 4 hours for headache . rReturn if concern for any reason, or see your doctor

## 2018-06-27 NOTE — ED Notes (Signed)
Pt states her systolic bp is normally in the 80's at baseline.

## 2018-06-27 NOTE — ED Notes (Signed)
Patient transported to CT 

## 2018-06-27 NOTE — ED Triage Notes (Signed)
Pt states that she is having internal boils that are busting on the inside of her, states that she can smell the odor. Also c/o headache X1 week with some tingling in left toes.

## 2018-06-27 NOTE — ED Notes (Signed)
Pt given coke and graham cracker.

## 2018-06-27 NOTE — ED Provider Notes (Addendum)
[facility] EMERGENCY DEPARTMENT Provider Note   CSN: [id number] Arrival date & time: 06/27/18  1107     History   Chief Complaint Chief Complaint  Patient presents with  . Headache    HPI Victoria [name] is a 62 y.o. female.  Signs of frontal headache gradual onset 1 week ago.  She feels drainage from her sinuses.  She is treated self with Tylenol with partial relief.  She denies any fever.  Associated symptoms include tingling in her left great toe.  No loss of bladder or bowel control.  No trauma.  She also reports "boils in my vagina which are busting.  With foul odor.  No other associated symptoms.  Nothing makes symptoms better or worse.  Headache is presently mild.  HPI  Past Medical History:  Diagnosis Date  . Chronic bronchitis ([place])   . Complication of anesthesia ~ 2011   "they gave me a medicine that swolled me and mouth burning up" (08/23/2012)  . Complication of anesthesia    slow to wake up  . ESRD (end stage renal disease) on dialysis [facility]) Nephrologist-- dr deterding   ESRD due to HTN-; "M/W/F; [place]" (11/28/2015  . GERD (gastroesophageal reflux disease)   . History of acute pulmonary edema    2003  . Hypertension    no longer on medications  . Left patella fracture   . Pneumonia    as a child  . Seasonal allergies   . Secondary hyperparathyroidism, renal ([place])    s/p  total parathyroidectomy  2014  . Thyroid disease   . Wears glasses     Patient Active Problem List   Diagnosis Date Noted  . Dehiscence of operative wound 11/28/2015  . Wound dehiscence, surgical 11/28/2015  . Renal dialysis device, implant, or graft complication 78/46/9629  . Aftercare following surgery of the circulatory system, [place] 03/30/2013  . Mechanical complication of other vascular device, implant, and graft 01/12/2013  . Other and unspecified hyperlipidemia 10/25/2012  . End stage renal disease ([place]) 09/22/2012  . Chest pain, mid sternal 08/23/2012  .  Hyperparathyroidism, secondary renal ([place]) 03/22/2011  . Thyroid nodule 03/22/2011  . High blood pressure 03/10/2011  . Kidney disease 03/10/2011    Past Surgical History:  Procedure Laterality Date  . ANKLE FRACTURE SURGERY Right ~ 2005   "has pins in it"  . APPLICATION OF WOUND VAC Left 11/28/2015   knee  . APPLICATION OF WOUND VAC Left 11/28/2015   Procedure: APPLICATION OF WOUND VAC;  Surgeon: Rod Can, MD;  Location: [facility];  Service: Orthopedics;  Laterality: Left;  . ARTERIOVENOUS GRAFT PLACEMENT  03-25-2005   Right forearm  w/  multiple Revision's   . CARDIOVASCULAR STRESS TEST  08-24-2012   abnormal nuclear study/  inferolateral and anteroseptal areas of scar,  no ischemia/  normal LV function and wall motion, ef 77%  . DIALYSIS FISTULA CREATION  1992   left upper arm ---  w/  Multiple Revision's until 2006  . ECTOPIC PREGNANCY SURGERY  1970's  . FRACTURE SURGERY    . I&D EXTREMITY Left 11/28/2015   Procedure: IRRIGATION AND DEBRIDEMENT LEFT KNEE WOUND ;  Surgeon: Rod Can, MD;  Location: [facility];  Service: Orthopedics;  Laterality: Left;  . INCISION AND DRAINAGE OF WOUND Left 11/28/2015   knee  . ORIF PATELLA Left 10/31/2015   Procedure: OPEN REDUCTION INTERNAL (ORIF) FIXATION LEFT PATELLA;  Surgeon: Rod Can, MD;  Location: [facility];  Service: Orthopedics;  Laterality: Left;  . PATELLAR TENDON REPAIR  10/03/2012   Procedure: PATELLA TENDON REPAIR;  Surgeon: Marin Shutter, MD;  Location: [facility];  Service: Orthopedics;  Laterality: Right;  . PATELLECTOMY  10/03/2012   Procedure: PATELLECTOMY;  Surgeon: Marin Shutter, MD;  Location: [facility];  Service: Orthopedics;  Laterality: Right;  RIGHT PARTIAL PATELLECTOMY AND PATELLA TENDON REPAIR  . REVISION OF ARTERIOVENOUS GORETEX GRAFT  09/27/2012   Procedure: REVISION OF ARTERIOVENOUS GORETEX GRAFT;  Surgeon: Mal Misty, MD;  Location: [place];  Service: Vascular;  Laterality: Right;  1) Replacement of  venous half of loop with 13mm Gortex graft  2) Excision of erroded pseudoaneurysm of graft with primary closure.  Marland Kitchen REVISION OF ARTERIOVENOUS GORETEX GRAFT Right 01/23/2013   Procedure: REVISION OF ARTERIOVENOUS GORETEX GRAFT;  Surgeon: Conrad [place], MD;  Location[facility] OR;  Service: Vascular;  Laterality: Right;  Using piece of 34mm x 20cm Gortex graft.   Marland Kitchen REVISION OF ARTERIOVENOUS GORETEX GRAFT Right 10/07/2015   Procedure: REVISION OF Right arm ARTERIOVENOUS GORETEX GRAFT;  Surgeon: Elam Dutch, MD;  Location: [facility] OR;  Service: Vascular;  Laterality: Right;  . REVISION OF ARTERIOVENOUS GORETEX GRAFT Right 02/17/2016   Procedure: REVISION OF ARTERIOVENOUS GORETEX GRAFT;  Surgeon: Elam Dutch, MD;  Location: [facility];  Service: Vascular;  Laterality: Right;  . TOTAL ABDOMINAL HYSTERECTOMY  03-05-2005   w/  Right Ovarian Cystectomy  . TOTAL PARATHYROIDECTOMY/  THYROID ISTHMUSECTOMY/  AUTOTRANSPLANTATION PARATHYROID TISSUE TO LEFT BRACHIORIADIALIS MUSCLE  02-18-2011  . TRANSTHORACIC ECHOCARDIOGRAM  10-31-2012   mild LVH,  grade 1 diastolic dysfunction,  ef 55-65%/  mild MR/  trivial TR     OB History   None      Home Medications    Prior to Admission medications   Medication Sig Start Date End Date Taking? Authorizing Provider  calcium acetate (PHOSLO) 667 MG capsule Take 2,001 mg by mouth 3 (three) times daily with meals.    Yes [provider]  dicloxacillin (DYNAPEN) 500 MG capsule Take 1 capsule (500 mg total) by mouth 4 (four) times daily. Patient not taking: Reported on 06/27/2018 10/19/17   Ezequiel Essex, MD    Family History Family History  Problem Relation Age of Onset  . Healthy Mother   . Hypertension Father   . Kidney failure Father   . Hypertension Sister   . Thyroid disease Sister     Social History Social History   Tobacco Use  . Smoking status: Never Smoker  . Smokeless tobacco: Never Used  Substance Use Topics  . Alcohol use: No    Alcohol/week:  0.0 standard drinks    Comment: quit 2007  . Drug use: No     Allergies   Amlodipine and Lisinopril   Review of Systems Review of Systems  Constitutional: Negative.   HENT: Negative.   Respiratory: Negative.   Cardiovascular: Negative.   Gastrointestinal: Negative.   Genitourinary:       "Boils" in vagina  Musculoskeletal: Negative.   Skin: Negative.   Allergic/Immunologic: Positive for immunocompromised state.       Dialysis patient  Neurological: Positive for headaches.       Tingling in left great toe  Psychiatric/Behavioral: Negative.   All other systems reviewed and are negative.    Physical Exam Updated Vital Signs BP 109/77   Pulse 71   Temp 98.9 F (37.2 C) (Oral)   Resp 18   Ht 5\' 3"  (  1.6 m)   Wt 77.1 kg   SpO2 100%   BMI 30.11 kg/m   Physical Exam  Constitutional: She is oriented to person, place, and time. She appears well-developed and well-nourished.  HENT:  Head: Normocephalic and atraumatic.  No facial asymmetry  Eyes: Pupils are equal, round, and reactive to light. Conjunctivae are normal.  Neck: Neck supple. No tracheal deviation present. No thyromegaly present.  Cardiovascular: Normal rate and regular rhythm.  No murmur heard. Pulmonary/Chest: Effort normal and breath sounds normal.  Abdominal: Soft. Bowel sounds are normal. She exhibits no distension. There is no tenderness.  Genitourinary:  Genitourinary Comments: Pelvic exam no external or internal lesion.  No tenderness, slight amount of white discharge.  Musculoskeletal: Normal range of motion. She exhibits no edema or tenderness.  Upper extremity with dialysis graft with good thrill.  All other extremities without redness swelling or tenderness neurovascular intact  Neurological: She is alert and oriented to person, place, and time. No cranial nerve deficit. Coordination normal.  Gait normal Romberg normal pronator drift normal  Skin: Skin is warm and dry. No rash noted.    Psychiatric: She has a normal mood and affect.  Nursing note and vitals reviewed.    ED Treatments / Results  Labs (all labs ordered are listed, but only abnormal results are displayed) Labs Reviewed - No data to display  EKG EKG Interpretation  Date/Time:  Tuesday June 27 2018 13:10:57 EDT Ventricular Rate:  73 PR Interval:    QRS Duration: 108 QT Interval:  424 QTC Calculation: 468 R Axis:   -74 Text Interpretation:  Sinus rhythm Ventricular premature complex Prolonged PR interval Anterolateral infarct, age indeterminate No significant change since last tracing Confirmed by Orlie Dakin [phone]) on 06/27/2018 9:05:31 PM   Radiology No results found.  Procedures Procedures (including critical care time)  Medications Ordered in ED Medications - No data to display Results for orders placed or performed during the hospital encounter of 10/18/17  Rapid strep screen  Result Value Ref Range   Streptococcus, Group A Screen (Direct) NEGATIVE NEGATIVE  Culture, group A strep  Result Value Ref Range   Specimen Description THROAT    Special Requests NONE Reflexed from [id number]    Culture      NO GROUP A STREP (S.PYOGENES) ISOLATED Performed at [facility] [address], [place], [place] [address]    Report Status 10/21/2017 FINAL   CBC  Result Value Ref Range   WBC 10.4 4.0 - 10.5 K/uL   RBC 5.60 (H) 3.87 - 5.11 MIL/uL   Hemoglobin 16.0 (H) 12.0 - 15.0 g/dL   HCT 48.0 (H) 36.0 - 46.0 %   MCV 85.7 78.0 - 100.0 fL   MCH 28.6 26.0 - 34.0 pg   MCHC 33.3 30.0 - 36.0 g/dL   RDW 14.2 11.5 - 15.5 %   Platelets 188 150 - 400 K/uL  Basic metabolic panel  Result Value Ref Range   Sodium 141 135 - 145 mmol/L   Potassium 4.6 3.5 - 5.1 mmol/L   Chloride 100 (L) 101 - 111 mmol/L   CO2 26 22 - 32 mmol/L   Glucose, Bld 95 65 - 99 mg/dL   BUN 42 (H) 6 - 20 mg/dL   Creatinine, Ser 11.53 (H) 0.44 - 1.00 mg/dL   Calcium 8.1 (L) 8.9 - 10.3 mg/dL   GFR calc non Af Amer 3  (L) >60 mL/min   GFR calc Af Amer 4 (L) >60 mL/min  Anion gap 15 5 - 15   Ct Head Wo Contrast  Result Date: 06/27/2018 CLINICAL DATA:  Frontal headache for 2 weeks EXAM: CT HEAD WITHOUT CONTRAST TECHNIQUE: Contiguous axial images were obtained from the base of the skull through the vertex without intravenous contrast. COMPARISON:  March 17, 2015 FINDINGS: Brain: Ventricles are normal in size and configuration. There is no intracranial mass, hemorrhage, extra-axial fluid collection, or midline shift. There is mild small vessel disease in the centra semiovale bilaterally. Brain parenchyma elsewhere appears unremarkable. There is no evident acute infarct. Vascular: No evident hyperdense vessel. There is calcification in each carotid siphon region. Skull: Bony calvarium a appears intact. Sinuses/Orbits: There is mild mucosal thickening in the right maxillary antrum. There is mucosal thickening in several ethmoid air cells. There is mucosal thickening along the anterior aspect of the right frontal sinus as well as in the lateral left frontal sinus. Orbits appear symmetric bilaterally. Other: Mastoid air cells are clear. IMPRESSION: Mild patchy small vessel disease in the centra semiovale bilaterally. Brain parenchyma otherwise appears unremarkable. No acute infarct evident. No mass or hemorrhage. There are foci of arterial vascular calcification. There are foci of paranasal sinus disease. Electronically Signed   By: Lowella Grip III M.D.   On: 06/27/2018 13:43   Initial Impression / Assessment and Plan / ED Course  I have reviewed the triage vital signs and the nursing notes.  Pertinent labs & imaging results that were available during my care of the patient were reviewed by me and considered in my medical decision making (see chart for details).     Declines pain medicine.  2:10 PM patient resting comfortably alert Glasgow Coma Score 15.  No distress. Plan Benadryl for sinus congestion.  Follow-up  with PMD as needed. Final Clinical Impressions(s) / ED Diagnoses  Diagnosis sinus headache Final diagnoses:  None    ED Discharge Orders    None       Orlie Dakin, MD 06/27/18 1417    Orlie Dakin, MD 06/27/18 2105

## 2018-06-27 NOTE — ED Notes (Signed)
Discharge instructions discussed with Pt. Pt verbalized understanding. Pt stable and ambulatory.    

## 2018-06-28 DIAGNOSIS — N2581 Secondary hyperparathyroidism of renal origin: Secondary | ICD-10-CM | POA: Diagnosis not present

## 2018-06-28 DIAGNOSIS — D509 Iron deficiency anemia, unspecified: Secondary | ICD-10-CM | POA: Diagnosis not present

## 2018-06-28 DIAGNOSIS — N186 End stage renal disease: Secondary | ICD-10-CM | POA: Diagnosis not present

## 2018-06-30 DIAGNOSIS — N186 End stage renal disease: Secondary | ICD-10-CM | POA: Diagnosis not present

## 2018-06-30 DIAGNOSIS — N2581 Secondary hyperparathyroidism of renal origin: Secondary | ICD-10-CM | POA: Diagnosis not present

## 2018-06-30 DIAGNOSIS — D509 Iron deficiency anemia, unspecified: Secondary | ICD-10-CM | POA: Diagnosis not present

## 2018-07-03 DIAGNOSIS — N186 End stage renal disease: Secondary | ICD-10-CM | POA: Diagnosis not present

## 2018-07-03 DIAGNOSIS — N2581 Secondary hyperparathyroidism of renal origin: Secondary | ICD-10-CM | POA: Diagnosis not present

## 2018-07-03 DIAGNOSIS — D509 Iron deficiency anemia, unspecified: Secondary | ICD-10-CM | POA: Diagnosis not present

## 2018-07-05 DIAGNOSIS — N186 End stage renal disease: Secondary | ICD-10-CM | POA: Diagnosis not present

## 2018-07-05 DIAGNOSIS — D509 Iron deficiency anemia, unspecified: Secondary | ICD-10-CM | POA: Diagnosis not present

## 2018-07-05 DIAGNOSIS — N2581 Secondary hyperparathyroidism of renal origin: Secondary | ICD-10-CM | POA: Diagnosis not present

## 2018-07-07 DIAGNOSIS — D509 Iron deficiency anemia, unspecified: Secondary | ICD-10-CM | POA: Diagnosis not present

## 2018-07-07 DIAGNOSIS — N186 End stage renal disease: Secondary | ICD-10-CM | POA: Diagnosis not present

## 2018-07-07 DIAGNOSIS — N2581 Secondary hyperparathyroidism of renal origin: Secondary | ICD-10-CM | POA: Diagnosis not present

## 2018-07-10 DIAGNOSIS — D509 Iron deficiency anemia, unspecified: Secondary | ICD-10-CM | POA: Diagnosis not present

## 2018-07-10 DIAGNOSIS — N2581 Secondary hyperparathyroidism of renal origin: Secondary | ICD-10-CM | POA: Diagnosis not present

## 2018-07-10 DIAGNOSIS — N186 End stage renal disease: Secondary | ICD-10-CM | POA: Diagnosis not present

## 2018-07-12 DIAGNOSIS — N2581 Secondary hyperparathyroidism of renal origin: Secondary | ICD-10-CM | POA: Diagnosis not present

## 2018-07-12 DIAGNOSIS — N186 End stage renal disease: Secondary | ICD-10-CM | POA: Diagnosis not present

## 2018-07-12 DIAGNOSIS — D509 Iron deficiency anemia, unspecified: Secondary | ICD-10-CM | POA: Diagnosis not present

## 2018-07-14 DIAGNOSIS — N2581 Secondary hyperparathyroidism of renal origin: Secondary | ICD-10-CM | POA: Diagnosis not present

## 2018-07-14 DIAGNOSIS — N186 End stage renal disease: Secondary | ICD-10-CM | POA: Diagnosis not present

## 2018-07-14 DIAGNOSIS — D509 Iron deficiency anemia, unspecified: Secondary | ICD-10-CM | POA: Diagnosis not present

## 2018-07-17 DIAGNOSIS — N2581 Secondary hyperparathyroidism of renal origin: Secondary | ICD-10-CM | POA: Diagnosis not present

## 2018-07-17 DIAGNOSIS — D509 Iron deficiency anemia, unspecified: Secondary | ICD-10-CM | POA: Diagnosis not present

## 2018-07-17 DIAGNOSIS — N186 End stage renal disease: Secondary | ICD-10-CM | POA: Diagnosis not present

## 2018-07-18 DIAGNOSIS — N186 End stage renal disease: Secondary | ICD-10-CM | POA: Diagnosis not present

## 2018-07-18 DIAGNOSIS — Z992 Dependence on renal dialysis: Secondary | ICD-10-CM | POA: Diagnosis not present

## 2018-07-18 DIAGNOSIS — I871 Compression of vein: Secondary | ICD-10-CM | POA: Diagnosis not present

## 2018-07-19 DIAGNOSIS — N186 End stage renal disease: Secondary | ICD-10-CM | POA: Diagnosis not present

## 2018-07-19 DIAGNOSIS — N2581 Secondary hyperparathyroidism of renal origin: Secondary | ICD-10-CM | POA: Diagnosis not present

## 2018-07-19 DIAGNOSIS — D509 Iron deficiency anemia, unspecified: Secondary | ICD-10-CM | POA: Diagnosis not present

## 2018-07-21 DIAGNOSIS — I120 Hypertensive chronic kidney disease with stage 5 chronic kidney disease or end stage renal disease: Secondary | ICD-10-CM | POA: Diagnosis not present

## 2018-07-21 DIAGNOSIS — N186 End stage renal disease: Secondary | ICD-10-CM | POA: Diagnosis not present

## 2018-07-21 DIAGNOSIS — D509 Iron deficiency anemia, unspecified: Secondary | ICD-10-CM | POA: Diagnosis not present

## 2018-07-21 DIAGNOSIS — N2581 Secondary hyperparathyroidism of renal origin: Secondary | ICD-10-CM | POA: Diagnosis not present

## 2018-07-21 DIAGNOSIS — Z992 Dependence on renal dialysis: Secondary | ICD-10-CM | POA: Diagnosis not present

## 2018-07-24 DIAGNOSIS — D509 Iron deficiency anemia, unspecified: Secondary | ICD-10-CM | POA: Diagnosis not present

## 2018-07-24 DIAGNOSIS — N2581 Secondary hyperparathyroidism of renal origin: Secondary | ICD-10-CM | POA: Diagnosis not present

## 2018-07-24 DIAGNOSIS — N186 End stage renal disease: Secondary | ICD-10-CM | POA: Diagnosis not present

## 2018-07-26 DIAGNOSIS — D509 Iron deficiency anemia, unspecified: Secondary | ICD-10-CM | POA: Diagnosis not present

## 2018-07-26 DIAGNOSIS — N186 End stage renal disease: Secondary | ICD-10-CM | POA: Diagnosis not present

## 2018-07-26 DIAGNOSIS — N2581 Secondary hyperparathyroidism of renal origin: Secondary | ICD-10-CM | POA: Diagnosis not present

## 2018-07-28 DIAGNOSIS — D509 Iron deficiency anemia, unspecified: Secondary | ICD-10-CM | POA: Diagnosis not present

## 2018-07-28 DIAGNOSIS — N2581 Secondary hyperparathyroidism of renal origin: Secondary | ICD-10-CM | POA: Diagnosis not present

## 2018-07-28 DIAGNOSIS — N186 End stage renal disease: Secondary | ICD-10-CM | POA: Diagnosis not present

## 2018-07-31 DIAGNOSIS — D509 Iron deficiency anemia, unspecified: Secondary | ICD-10-CM | POA: Diagnosis not present

## 2018-07-31 DIAGNOSIS — N2581 Secondary hyperparathyroidism of renal origin: Secondary | ICD-10-CM | POA: Diagnosis not present

## 2018-07-31 DIAGNOSIS — N186 End stage renal disease: Secondary | ICD-10-CM | POA: Diagnosis not present

## 2018-08-02 DIAGNOSIS — N186 End stage renal disease: Secondary | ICD-10-CM | POA: Diagnosis not present

## 2018-08-02 DIAGNOSIS — N2581 Secondary hyperparathyroidism of renal origin: Secondary | ICD-10-CM | POA: Diagnosis not present

## 2018-08-02 DIAGNOSIS — D509 Iron deficiency anemia, unspecified: Secondary | ICD-10-CM | POA: Diagnosis not present

## 2018-08-04 DIAGNOSIS — N186 End stage renal disease: Secondary | ICD-10-CM | POA: Diagnosis not present

## 2018-08-04 DIAGNOSIS — D509 Iron deficiency anemia, unspecified: Secondary | ICD-10-CM | POA: Diagnosis not present

## 2018-08-04 DIAGNOSIS — N2581 Secondary hyperparathyroidism of renal origin: Secondary | ICD-10-CM | POA: Diagnosis not present

## 2018-08-07 DIAGNOSIS — D509 Iron deficiency anemia, unspecified: Secondary | ICD-10-CM | POA: Diagnosis not present

## 2018-08-07 DIAGNOSIS — N2581 Secondary hyperparathyroidism of renal origin: Secondary | ICD-10-CM | POA: Diagnosis not present

## 2018-08-07 DIAGNOSIS — N186 End stage renal disease: Secondary | ICD-10-CM | POA: Diagnosis not present

## 2018-08-09 DIAGNOSIS — D509 Iron deficiency anemia, unspecified: Secondary | ICD-10-CM | POA: Diagnosis not present

## 2018-08-09 DIAGNOSIS — N2581 Secondary hyperparathyroidism of renal origin: Secondary | ICD-10-CM | POA: Diagnosis not present

## 2018-08-09 DIAGNOSIS — N186 End stage renal disease: Secondary | ICD-10-CM | POA: Diagnosis not present

## 2018-08-11 DIAGNOSIS — N2581 Secondary hyperparathyroidism of renal origin: Secondary | ICD-10-CM | POA: Diagnosis not present

## 2018-08-11 DIAGNOSIS — D509 Iron deficiency anemia, unspecified: Secondary | ICD-10-CM | POA: Diagnosis not present

## 2018-08-11 DIAGNOSIS — N186 End stage renal disease: Secondary | ICD-10-CM | POA: Diagnosis not present

## 2018-08-13 DIAGNOSIS — D509 Iron deficiency anemia, unspecified: Secondary | ICD-10-CM | POA: Diagnosis not present

## 2018-08-13 DIAGNOSIS — N186 End stage renal disease: Secondary | ICD-10-CM | POA: Diagnosis not present

## 2018-08-13 DIAGNOSIS — N2581 Secondary hyperparathyroidism of renal origin: Secondary | ICD-10-CM | POA: Diagnosis not present

## 2018-08-15 DIAGNOSIS — N2581 Secondary hyperparathyroidism of renal origin: Secondary | ICD-10-CM | POA: Diagnosis not present

## 2018-08-15 DIAGNOSIS — N186 End stage renal disease: Secondary | ICD-10-CM | POA: Diagnosis not present

## 2018-08-15 DIAGNOSIS — D509 Iron deficiency anemia, unspecified: Secondary | ICD-10-CM | POA: Diagnosis not present

## 2018-08-18 DIAGNOSIS — N186 End stage renal disease: Secondary | ICD-10-CM | POA: Diagnosis not present

## 2018-08-18 DIAGNOSIS — D509 Iron deficiency anemia, unspecified: Secondary | ICD-10-CM | POA: Diagnosis not present

## 2018-08-18 DIAGNOSIS — N2581 Secondary hyperparathyroidism of renal origin: Secondary | ICD-10-CM | POA: Diagnosis not present

## 2018-08-20 DIAGNOSIS — N186 End stage renal disease: Secondary | ICD-10-CM | POA: Diagnosis not present

## 2018-08-20 DIAGNOSIS — Z992 Dependence on renal dialysis: Secondary | ICD-10-CM | POA: Diagnosis not present

## 2018-08-20 DIAGNOSIS — I120 Hypertensive chronic kidney disease with stage 5 chronic kidney disease or end stage renal disease: Secondary | ICD-10-CM | POA: Diagnosis not present

## 2018-08-21 DIAGNOSIS — N186 End stage renal disease: Secondary | ICD-10-CM | POA: Diagnosis not present

## 2018-08-21 DIAGNOSIS — N2581 Secondary hyperparathyroidism of renal origin: Secondary | ICD-10-CM | POA: Diagnosis not present

## 2018-08-21 DIAGNOSIS — D509 Iron deficiency anemia, unspecified: Secondary | ICD-10-CM | POA: Diagnosis not present

## 2018-08-23 DIAGNOSIS — N186 End stage renal disease: Secondary | ICD-10-CM | POA: Diagnosis not present

## 2018-08-23 DIAGNOSIS — D509 Iron deficiency anemia, unspecified: Secondary | ICD-10-CM | POA: Diagnosis not present

## 2018-08-23 DIAGNOSIS — N2581 Secondary hyperparathyroidism of renal origin: Secondary | ICD-10-CM | POA: Diagnosis not present

## 2018-08-25 DIAGNOSIS — D509 Iron deficiency anemia, unspecified: Secondary | ICD-10-CM | POA: Diagnosis not present

## 2018-08-25 DIAGNOSIS — N186 End stage renal disease: Secondary | ICD-10-CM | POA: Diagnosis not present

## 2018-08-25 DIAGNOSIS — N2581 Secondary hyperparathyroidism of renal origin: Secondary | ICD-10-CM | POA: Diagnosis not present

## 2018-08-28 DIAGNOSIS — N186 End stage renal disease: Secondary | ICD-10-CM | POA: Diagnosis not present

## 2018-08-28 DIAGNOSIS — D509 Iron deficiency anemia, unspecified: Secondary | ICD-10-CM | POA: Diagnosis not present

## 2018-08-28 DIAGNOSIS — N2581 Secondary hyperparathyroidism of renal origin: Secondary | ICD-10-CM | POA: Diagnosis not present

## 2018-08-30 DIAGNOSIS — N2581 Secondary hyperparathyroidism of renal origin: Secondary | ICD-10-CM | POA: Diagnosis not present

## 2018-08-30 DIAGNOSIS — D509 Iron deficiency anemia, unspecified: Secondary | ICD-10-CM | POA: Diagnosis not present

## 2018-08-30 DIAGNOSIS — N186 End stage renal disease: Secondary | ICD-10-CM | POA: Diagnosis not present

## 2018-09-01 DIAGNOSIS — N2581 Secondary hyperparathyroidism of renal origin: Secondary | ICD-10-CM | POA: Diagnosis not present

## 2018-09-01 DIAGNOSIS — N186 End stage renal disease: Secondary | ICD-10-CM | POA: Diagnosis not present

## 2018-09-01 DIAGNOSIS — D509 Iron deficiency anemia, unspecified: Secondary | ICD-10-CM | POA: Diagnosis not present

## 2018-09-04 DIAGNOSIS — N186 End stage renal disease: Secondary | ICD-10-CM | POA: Diagnosis not present

## 2018-09-04 DIAGNOSIS — N2581 Secondary hyperparathyroidism of renal origin: Secondary | ICD-10-CM | POA: Diagnosis not present

## 2018-09-04 DIAGNOSIS — D509 Iron deficiency anemia, unspecified: Secondary | ICD-10-CM | POA: Diagnosis not present

## 2018-09-06 DIAGNOSIS — N2581 Secondary hyperparathyroidism of renal origin: Secondary | ICD-10-CM | POA: Diagnosis not present

## 2018-09-06 DIAGNOSIS — D509 Iron deficiency anemia, unspecified: Secondary | ICD-10-CM | POA: Diagnosis not present

## 2018-09-06 DIAGNOSIS — N186 End stage renal disease: Secondary | ICD-10-CM | POA: Diagnosis not present

## 2018-09-08 DIAGNOSIS — N2581 Secondary hyperparathyroidism of renal origin: Secondary | ICD-10-CM | POA: Diagnosis not present

## 2018-09-08 DIAGNOSIS — D509 Iron deficiency anemia, unspecified: Secondary | ICD-10-CM | POA: Diagnosis not present

## 2018-09-08 DIAGNOSIS — N186 End stage renal disease: Secondary | ICD-10-CM | POA: Diagnosis not present

## 2018-09-10 DIAGNOSIS — N186 End stage renal disease: Secondary | ICD-10-CM | POA: Diagnosis not present

## 2018-09-10 DIAGNOSIS — N2581 Secondary hyperparathyroidism of renal origin: Secondary | ICD-10-CM | POA: Diagnosis not present

## 2018-09-10 DIAGNOSIS — D509 Iron deficiency anemia, unspecified: Secondary | ICD-10-CM | POA: Diagnosis not present

## 2018-09-12 DIAGNOSIS — N186 End stage renal disease: Secondary | ICD-10-CM | POA: Diagnosis not present

## 2018-09-12 DIAGNOSIS — D509 Iron deficiency anemia, unspecified: Secondary | ICD-10-CM | POA: Diagnosis not present

## 2018-09-12 DIAGNOSIS — N2581 Secondary hyperparathyroidism of renal origin: Secondary | ICD-10-CM | POA: Diagnosis not present

## 2018-09-15 DIAGNOSIS — D509 Iron deficiency anemia, unspecified: Secondary | ICD-10-CM | POA: Diagnosis not present

## 2018-09-15 DIAGNOSIS — N186 End stage renal disease: Secondary | ICD-10-CM | POA: Diagnosis not present

## 2018-09-15 DIAGNOSIS — N2581 Secondary hyperparathyroidism of renal origin: Secondary | ICD-10-CM | POA: Diagnosis not present

## 2018-09-17 DIAGNOSIS — N2581 Secondary hyperparathyroidism of renal origin: Secondary | ICD-10-CM | POA: Diagnosis not present

## 2018-09-17 DIAGNOSIS — D509 Iron deficiency anemia, unspecified: Secondary | ICD-10-CM | POA: Diagnosis not present

## 2018-09-17 DIAGNOSIS — N186 End stage renal disease: Secondary | ICD-10-CM | POA: Diagnosis not present

## 2018-09-19 DIAGNOSIS — N186 End stage renal disease: Secondary | ICD-10-CM | POA: Diagnosis not present

## 2018-09-19 DIAGNOSIS — D509 Iron deficiency anemia, unspecified: Secondary | ICD-10-CM | POA: Diagnosis not present

## 2018-09-19 DIAGNOSIS — N2581 Secondary hyperparathyroidism of renal origin: Secondary | ICD-10-CM | POA: Diagnosis not present

## 2018-09-20 DIAGNOSIS — Z992 Dependence on renal dialysis: Secondary | ICD-10-CM | POA: Diagnosis not present

## 2018-09-20 DIAGNOSIS — N186 End stage renal disease: Secondary | ICD-10-CM | POA: Diagnosis not present

## 2018-09-20 DIAGNOSIS — I120 Hypertensive chronic kidney disease with stage 5 chronic kidney disease or end stage renal disease: Secondary | ICD-10-CM | POA: Diagnosis not present

## 2018-09-22 DIAGNOSIS — N186 End stage renal disease: Secondary | ICD-10-CM | POA: Diagnosis not present

## 2018-09-22 DIAGNOSIS — N2581 Secondary hyperparathyroidism of renal origin: Secondary | ICD-10-CM | POA: Diagnosis not present

## 2018-09-22 DIAGNOSIS — D509 Iron deficiency anemia, unspecified: Secondary | ICD-10-CM | POA: Diagnosis not present

## 2018-09-25 DIAGNOSIS — D509 Iron deficiency anemia, unspecified: Secondary | ICD-10-CM | POA: Diagnosis not present

## 2018-09-25 DIAGNOSIS — N186 End stage renal disease: Secondary | ICD-10-CM | POA: Diagnosis not present

## 2018-09-25 DIAGNOSIS — N2581 Secondary hyperparathyroidism of renal origin: Secondary | ICD-10-CM | POA: Diagnosis not present

## 2018-09-27 DIAGNOSIS — N2581 Secondary hyperparathyroidism of renal origin: Secondary | ICD-10-CM | POA: Diagnosis not present

## 2018-09-27 DIAGNOSIS — D509 Iron deficiency anemia, unspecified: Secondary | ICD-10-CM | POA: Diagnosis not present

## 2018-09-27 DIAGNOSIS — N186 End stage renal disease: Secondary | ICD-10-CM | POA: Diagnosis not present

## 2018-09-29 ENCOUNTER — Encounter (HOSPITAL_COMMUNITY): Payer: Self-pay | Admitting: Internal Medicine

## 2018-09-29 ENCOUNTER — Ambulatory Visit (HOSPITAL_COMMUNITY)
Admission: EM | Admit: 2018-09-29 | Discharge: 2018-09-29 | Disposition: A | Discharge status: Home / Self Care | Payer: Medicare Other | Attending: Family Medicine | Admitting: Family Medicine

## 2018-09-29 ENCOUNTER — Ambulatory Visit (INDEPENDENT_AMBULATORY_CARE_PROVIDER_SITE_OTHER): Payer: Medicare Other

## 2018-09-29 DIAGNOSIS — Z992 Dependence on renal dialysis: Secondary | ICD-10-CM

## 2018-09-29 DIAGNOSIS — N186 End stage renal disease: Secondary | ICD-10-CM

## 2018-09-29 DIAGNOSIS — R05 Cough: Secondary | ICD-10-CM | POA: Diagnosis not present

## 2018-09-29 DIAGNOSIS — R55 Syncope and collapse: Secondary | ICD-10-CM

## 2018-09-29 DIAGNOSIS — N2581 Secondary hyperparathyroidism of renal origin: Secondary | ICD-10-CM | POA: Diagnosis not present

## 2018-09-29 DIAGNOSIS — I10 Essential (primary) hypertension: Secondary | ICD-10-CM | POA: Diagnosis not present

## 2018-09-29 DIAGNOSIS — D509 Iron deficiency anemia, unspecified: Secondary | ICD-10-CM | POA: Diagnosis not present

## 2018-09-29 MED ORDER — PREDNISONE 50 MG PO TABS: ORAL_TABLET | ORAL | 0 refills | Status: DC

## 2018-09-29 MED ORDER — IPRATROPIUM-ALBUTEROL 0.5-2.5 (3) MG/3ML IN SOLN
RESPIRATORY_TRACT | Status: AC
  Filled 2018-09-29: qty 3

## 2018-09-29 MED ORDER — IPRATROPIUM-ALBUTEROL 0.5-2.5 (3) MG/3ML IN SOLN
3.0000 mL | Freq: Once | RESPIRATORY_TRACT | Status: AC
  Administered 2018-09-29: 3 mL via RESPIRATORY_TRACT

## 2018-09-29 MED ORDER — CEFDINIR 300 MG PO CAPS: 600.0000 mg | ORAL_CAPSULE | Freq: Every day | ORAL | 0 refills | Status: DC

## 2018-09-29 NOTE — ED Provider Notes (Addendum)
Victoria [name]    CSN: [id number] Arrival date & time: 09/29/18  1011     History   Chief Complaint Chief Complaint  Patient presents with  . Sore Throat    HPI Victoria [name] is a 63 y.o. female.   This is a 63 year old dialysis patient who is dialyzed today.  She has had a cough for several days and saw her doctor 2 days ago.  She has been lightheaded since dialysis today and when she arrived the office coughing, she passed out.  Patient has a history of pneumonia.    Problem list is confusing, redundant, and misleading.     Past Medical History:  Diagnosis Date  . Chronic bronchitis ([place])   . Complication of anesthesia ~ 2011   "they gave me a medicine that swolled me and mouth burning up" (08/23/2012)  . Complication of anesthesia    slow to wake up  . ESRD (end stage renal disease) on dialysis [facility]) Nephrologist-- dr deterding   ESRD due to HTN-; "M/W/F; [place]" (11/28/2015  . GERD (gastroesophageal reflux disease)   . History of acute pulmonary edema    2003  . Hypertension    no longer on medications  . Left patella fracture   . Pneumonia    as a child  . Seasonal allergies   . Secondary hyperparathyroidism, renal ([place])    s/p  total parathyroidectomy  2014  . Thyroid disease   . Wears glasses     Patient Active Problem List   Diagnosis Date Noted  . Dehiscence of operative wound 11/28/2015  . Wound dehiscence, surgical 11/28/2015  . Renal dialysis device, implant, or graft complication 59/16/3846  . Aftercare following surgery of the circulatory system, [place] 03/30/2013  . Mechanical complication of other vascular device, implant, and graft 01/12/2013  . Other and unspecified hyperlipidemia 10/25/2012  . End stage renal disease ([place]) 09/22/2012  . Chest pain, mid sternal 08/23/2012  . Hyperparathyroidism, secondary renal ([place]) 03/22/2011  . Thyroid nodule 03/22/2011  . High blood pressure 03/10/2011  . Kidney disease 03/10/2011     Past Surgical History:  Procedure Laterality Date  . ANKLE FRACTURE SURGERY Right ~ 2005   "has pins in it"  . APPLICATION OF WOUND VAC Left 11/28/2015   knee  . APPLICATION OF WOUND VAC Left 11/28/2015   Procedure: APPLICATION OF WOUND VAC;  Surgeon: Rod Can, MD;  Location: [facility];  Service: Orthopedics;  Laterality: Left;  . ARTERIOVENOUS GRAFT PLACEMENT  03-25-2005   Right forearm  w/  multiple Revision's   . CARDIOVASCULAR STRESS TEST  08-24-2012   abnormal nuclear study/  inferolateral and anteroseptal areas of scar,  no ischemia/  normal LV function and wall motion, ef 77%  . DIALYSIS FISTULA CREATION  1992   left upper arm ---  w/  Multiple Revision's until 2006  . ECTOPIC PREGNANCY SURGERY  1970's  . FRACTURE SURGERY    . I&D EXTREMITY Left 11/28/2015   Procedure: IRRIGATION AND DEBRIDEMENT LEFT KNEE WOUND ;  Surgeon: Rod Can, MD;  Location: [place] [facility];  Service: Orthopedics;  Laterality: Left;  . INCISION AND DRAINAGE OF WOUND Left 11/28/2015   knee  . ORIF PATELLA Left 10/31/2015   Procedure: OPEN REDUCTION INTERNAL (ORIF) FIXATION LEFT PATELLA;  Surgeon: Rod Can, MD;  Location: [facility];  Service: Orthopedics;  Laterality: Left;  . PATELLAR TENDON REPAIR  10/03/2012   Procedure: PATELLA TENDON REPAIR;  Surgeon: Marin Shutter, MD;  Location: [place] [facility];  Service: Orthopedics;  Laterality: Right;  . PATELLECTOMY  10/03/2012   Procedure: PATELLECTOMY;  Surgeon: Marin Shutter, MD;  Location: [facility];  Service: Orthopedics;  Laterality: Right;  RIGHT PARTIAL PATELLECTOMY AND PATELLA TENDON REPAIR  . REVISION OF ARTERIOVENOUS GORETEX GRAFT  09/27/2012   Procedure: REVISION OF ARTERIOVENOUS GORETEX GRAFT;  Surgeon: Mal Misty, MD;  Location: [facility];  Service: Vascular;  Laterality: Right;  1) Replacement of venous half of loop with 30mm Gortex graft  2) Excision of erroded pseudoaneurysm of graft with primary closure.  Marland Kitchen REVISION OF ARTERIOVENOUS GORETEX  GRAFT Right 01/23/2013   Procedure: REVISION OF ARTERIOVENOUS GORETEX GRAFT;  Surgeon: Conrad [place], MD;  Location: [facility] OR;  Service: Vascular;  Laterality: Right;  Using piece of 67mm x 20cm Gortex graft.   Marland Kitchen REVISION OF ARTERIOVENOUS GORETEX GRAFT Right 10/07/2015   Procedure: REVISION OF Right arm ARTERIOVENOUS GORETEX GRAFT;  Surgeon: Elam Dutch, MD;  Location: [facility] OR;  Service: Vascular;  Laterality: Right;  . REVISION OF ARTERIOVENOUS GORETEX GRAFT Right 02/17/2016   Procedure: REVISION OF ARTERIOVENOUS GORETEX GRAFT;  Surgeon: Elam Dutch, MD;  Location: [place];  Service: Vascular;  Laterality: Right;  . TOTAL ABDOMINAL HYSTERECTOMY  03-05-2005   w/  Right Ovarian Cystectomy  . TOTAL PARATHYROIDECTOMY/  THYROID ISTHMUSECTOMY/  AUTOTRANSPLANTATION PARATHYROID TISSUE TO LEFT BRACHIORIADIALIS MUSCLE  02-18-2011  . TRANSTHORACIC ECHOCARDIOGRAM  10-31-2012   mild LVH,  grade 1 diastolic dysfunction,  ef 55-65%/  mild MR/  trivial TR    OB History   No obstetric history on file.      Home Medications    Prior to Admission medications   Medication Sig Start Date End Date Taking? Authorizing Provider  calcium acetate (PHOSLO) 667 MG capsule Take 2,001 mg by mouth 3 (three) times daily with meals.     [provider]  cefdinir (OMNICEF) 300 MG capsule Take 2 capsules (600 mg total) by mouth daily. 09/29/18   Robyn Haber, MD  predniSONE (DELTASONE) 50 MG tablet One daily with food 09/29/18   Robyn Haber, MD    Family History Family History  Problem Relation Age of Onset  . Healthy Mother   . Hypertension Father   . Kidney failure Father   . Hypertension Sister   . Thyroid disease Sister     Social History Social History   Tobacco Use  . Smoking status: Never Smoker  . Smokeless tobacco: Never Used  Substance Use Topics  . Alcohol use: No    Alcohol/week: 0.0 standard drinks    Comment: quit 2007  . Drug use: No     Allergies   Amlodipine and  Lisinopril   Review of Systems Review of Systems   Physical Exam Triage Vital Signs ED Triage Vitals  Enc Vitals Group     BP 09/29/18 1109 (!) 78/40     Pulse Rate 09/29/18 1109 100     Resp 09/29/18 1109 18     Temp 09/29/18 1109 97.6 F (36.4 C)     Temp Source 09/29/18 1109 Oral     SpO2 09/29/18 1109 94 %     Weight --      Height --      Head Circumference --      Peak Flow --      Pain Score 09/29/18 1114 10     Pain Loc --      Pain Edu? --  Excl. in GC? --    No data found.  Updated Vital Signs BP 119/61   Pulse 100   Temp 97.6 F (36.4 C) (Oral)   Resp 18   SpO2 94%    Physical Exam Vitals signs and nursing note reviewed.  Constitutional:      Appearance: She is well-developed. She is obese.  HENT:     Head: Normocephalic and atraumatic.     Mouth/Throat:     Mouth: Mucous membranes are moist.     Pharynx: Oropharynx is clear.  Eyes:     Conjunctiva/sclera: Conjunctivae normal.  Neck:     Musculoskeletal: Normal range of motion and neck supple.  Cardiovascular:     Rate and Rhythm: Tachycardia present.     Heart sounds: Normal heart sounds.  Pulmonary:     Effort: Respiratory distress present.     Breath sounds: No stridor. Wheezing, rhonchi and rales present.  Chest:     Chest wall: No tenderness.  Abdominal:     Palpations: Abdomen is soft.  Skin:    General: Skin is warm and dry.  Neurological:     Mental Status: She is alert and oriented to person, place, and time.     Comments: Once patient resolved her syncope, she showed no focal neurological signs  Psychiatric:        Mood and Affect: Mood normal.      UC Treatments / Results  Labs (all labs ordered are listed, but only abnormal results are displayed) Labs Reviewed - No data to display  EKG None  Radiology Dg Chest 2 View  Result Date: 09/29/2018 CLINICAL DATA:  Cough. Dialysis patient EXAM: CHEST - 2 VIEW COMPARISON:  10/17/2015 FINDINGS: Heart size upper  normal. Negative for edema or effusion. Chronic elevation left hemidiaphragm. Negative for pneumonia. IMPRESSION: No active cardiopulmonary disease. Electronically Signed   By: Franchot Gallo M.D.   On: 09/29/2018 11:35    Procedures Procedures (including critical care time)  Medications Ordered in UC Medications  ipratropium-albuterol (DUONEB) 0.5-2.5 (3) MG/3ML nebulizer solution 3 mL (has no administration in time range)    Initial Impression / Assessment and Plan / UC Course  I have reviewed the triage vital signs and the nursing notes.  Pertinent labs & imaging results that were available during my care of the patient were reviewed by me and considered in my medical decision making (see chart for details).     Final Clinical Impressions(s) / UC Diagnoses   Final diagnoses:  None   Discharge Instructions   None    ED Prescriptions    Medication Sig Dispense Auth. Provider   cefdinir (OMNICEF) 300 MG capsule Take 2 capsules (600 mg total) by mouth daily. 20 capsule Robyn Haber, MD   predniSONE (DELTASONE) 50 MG tablet One daily with food 5 tablet Robyn Haber, MD     Controlled Substance Prescriptions [place] Controlled Substance Registry consulted? Not Applicable   Robyn Haber, MD 09/29/18 1134    Robyn Haber, MD 09/29/18 1149

## 2018-09-29 NOTE — ED Triage Notes (Signed)
Pt c/o cough and sore throat x2wks, saw PCP on Wednesday dx'd with laryngitis. Pt states no better, voice is sounding worse, side and stomach hurts from coughing

## 2018-10-02 DIAGNOSIS — N186 End stage renal disease: Secondary | ICD-10-CM | POA: Diagnosis not present

## 2018-10-02 DIAGNOSIS — N2581 Secondary hyperparathyroidism of renal origin: Secondary | ICD-10-CM | POA: Diagnosis not present

## 2018-10-02 DIAGNOSIS — D509 Iron deficiency anemia, unspecified: Secondary | ICD-10-CM | POA: Diagnosis not present

## 2018-10-03 DIAGNOSIS — N186 End stage renal disease: Secondary | ICD-10-CM | POA: Diagnosis not present

## 2018-10-03 DIAGNOSIS — I871 Compression of vein: Secondary | ICD-10-CM | POA: Diagnosis not present

## 2018-10-03 DIAGNOSIS — Z992 Dependence on renal dialysis: Secondary | ICD-10-CM | POA: Diagnosis not present

## 2018-10-03 DIAGNOSIS — T82858A Stenosis of vascular prosthetic devices, implants and grafts, initial encounter: Secondary | ICD-10-CM | POA: Diagnosis not present

## 2018-10-04 DIAGNOSIS — D509 Iron deficiency anemia, unspecified: Secondary | ICD-10-CM | POA: Diagnosis not present

## 2018-10-04 DIAGNOSIS — N2581 Secondary hyperparathyroidism of renal origin: Secondary | ICD-10-CM | POA: Diagnosis not present

## 2018-10-04 DIAGNOSIS — N186 End stage renal disease: Secondary | ICD-10-CM | POA: Diagnosis not present

## 2018-10-06 DIAGNOSIS — N2581 Secondary hyperparathyroidism of renal origin: Secondary | ICD-10-CM | POA: Diagnosis not present

## 2018-10-06 DIAGNOSIS — N186 End stage renal disease: Secondary | ICD-10-CM | POA: Diagnosis not present

## 2018-10-06 DIAGNOSIS — D509 Iron deficiency anemia, unspecified: Secondary | ICD-10-CM | POA: Diagnosis not present

## 2018-10-09 DIAGNOSIS — D509 Iron deficiency anemia, unspecified: Secondary | ICD-10-CM | POA: Diagnosis not present

## 2018-10-09 DIAGNOSIS — N2581 Secondary hyperparathyroidism of renal origin: Secondary | ICD-10-CM | POA: Diagnosis not present

## 2018-10-09 DIAGNOSIS — N186 End stage renal disease: Secondary | ICD-10-CM | POA: Diagnosis not present

## 2018-10-11 DIAGNOSIS — D509 Iron deficiency anemia, unspecified: Secondary | ICD-10-CM | POA: Diagnosis not present

## 2018-10-11 DIAGNOSIS — N2581 Secondary hyperparathyroidism of renal origin: Secondary | ICD-10-CM | POA: Diagnosis not present

## 2018-10-11 DIAGNOSIS — N186 End stage renal disease: Secondary | ICD-10-CM | POA: Diagnosis not present

## 2018-10-13 DIAGNOSIS — N2581 Secondary hyperparathyroidism of renal origin: Secondary | ICD-10-CM | POA: Diagnosis not present

## 2018-10-13 DIAGNOSIS — N186 End stage renal disease: Secondary | ICD-10-CM | POA: Diagnosis not present

## 2018-10-13 DIAGNOSIS — D509 Iron deficiency anemia, unspecified: Secondary | ICD-10-CM | POA: Diagnosis not present

## 2018-10-16 DIAGNOSIS — D509 Iron deficiency anemia, unspecified: Secondary | ICD-10-CM | POA: Diagnosis not present

## 2018-10-16 DIAGNOSIS — N186 End stage renal disease: Secondary | ICD-10-CM | POA: Diagnosis not present

## 2018-10-16 DIAGNOSIS — N2581 Secondary hyperparathyroidism of renal origin: Secondary | ICD-10-CM | POA: Diagnosis not present

## 2018-10-18 DIAGNOSIS — N2581 Secondary hyperparathyroidism of renal origin: Secondary | ICD-10-CM | POA: Diagnosis not present

## 2018-10-18 DIAGNOSIS — N186 End stage renal disease: Secondary | ICD-10-CM | POA: Diagnosis not present

## 2018-10-18 DIAGNOSIS — D509 Iron deficiency anemia, unspecified: Secondary | ICD-10-CM | POA: Diagnosis not present

## 2018-10-20 DIAGNOSIS — N186 End stage renal disease: Secondary | ICD-10-CM | POA: Diagnosis not present

## 2018-10-20 DIAGNOSIS — N2581 Secondary hyperparathyroidism of renal origin: Secondary | ICD-10-CM | POA: Diagnosis not present

## 2018-10-20 DIAGNOSIS — D509 Iron deficiency anemia, unspecified: Secondary | ICD-10-CM | POA: Diagnosis not present

## 2018-10-21 DIAGNOSIS — I120 Hypertensive chronic kidney disease with stage 5 chronic kidney disease or end stage renal disease: Secondary | ICD-10-CM | POA: Diagnosis not present

## 2018-10-21 DIAGNOSIS — N186 End stage renal disease: Secondary | ICD-10-CM | POA: Diagnosis not present

## 2018-10-21 DIAGNOSIS — Z992 Dependence on renal dialysis: Secondary | ICD-10-CM | POA: Diagnosis not present

## 2018-10-23 DIAGNOSIS — N186 End stage renal disease: Secondary | ICD-10-CM | POA: Diagnosis not present

## 2018-10-23 DIAGNOSIS — D509 Iron deficiency anemia, unspecified: Secondary | ICD-10-CM | POA: Diagnosis not present

## 2018-10-23 DIAGNOSIS — N2581 Secondary hyperparathyroidism of renal origin: Secondary | ICD-10-CM | POA: Diagnosis not present

## 2018-10-25 DIAGNOSIS — N2581 Secondary hyperparathyroidism of renal origin: Secondary | ICD-10-CM | POA: Diagnosis not present

## 2018-10-25 DIAGNOSIS — D509 Iron deficiency anemia, unspecified: Secondary | ICD-10-CM | POA: Diagnosis not present

## 2018-10-25 DIAGNOSIS — N186 End stage renal disease: Secondary | ICD-10-CM | POA: Diagnosis not present

## 2018-10-27 DIAGNOSIS — N186 End stage renal disease: Secondary | ICD-10-CM | POA: Diagnosis not present

## 2018-10-27 DIAGNOSIS — D509 Iron deficiency anemia, unspecified: Secondary | ICD-10-CM | POA: Diagnosis not present

## 2018-10-27 DIAGNOSIS — N2581 Secondary hyperparathyroidism of renal origin: Secondary | ICD-10-CM | POA: Diagnosis not present

## 2018-10-30 DIAGNOSIS — D509 Iron deficiency anemia, unspecified: Secondary | ICD-10-CM | POA: Diagnosis not present

## 2018-10-30 DIAGNOSIS — N2581 Secondary hyperparathyroidism of renal origin: Secondary | ICD-10-CM | POA: Diagnosis not present

## 2018-10-30 DIAGNOSIS — N186 End stage renal disease: Secondary | ICD-10-CM | POA: Diagnosis not present

## 2018-11-01 DIAGNOSIS — N186 End stage renal disease: Secondary | ICD-10-CM | POA: Diagnosis not present

## 2018-11-01 DIAGNOSIS — D509 Iron deficiency anemia, unspecified: Secondary | ICD-10-CM | POA: Diagnosis not present

## 2018-11-01 DIAGNOSIS — N2581 Secondary hyperparathyroidism of renal origin: Secondary | ICD-10-CM | POA: Diagnosis not present

## 2018-11-03 DIAGNOSIS — D509 Iron deficiency anemia, unspecified: Secondary | ICD-10-CM | POA: Diagnosis not present

## 2018-11-03 DIAGNOSIS — N2581 Secondary hyperparathyroidism of renal origin: Secondary | ICD-10-CM | POA: Diagnosis not present

## 2018-11-03 DIAGNOSIS — N186 End stage renal disease: Secondary | ICD-10-CM | POA: Diagnosis not present

## 2018-11-06 DIAGNOSIS — D509 Iron deficiency anemia, unspecified: Secondary | ICD-10-CM | POA: Diagnosis not present

## 2018-11-06 DIAGNOSIS — N2581 Secondary hyperparathyroidism of renal origin: Secondary | ICD-10-CM | POA: Diagnosis not present

## 2018-11-06 DIAGNOSIS — N186 End stage renal disease: Secondary | ICD-10-CM | POA: Diagnosis not present

## 2018-11-08 DIAGNOSIS — N2581 Secondary hyperparathyroidism of renal origin: Secondary | ICD-10-CM | POA: Diagnosis not present

## 2018-11-08 DIAGNOSIS — N186 End stage renal disease: Secondary | ICD-10-CM | POA: Diagnosis not present

## 2018-11-08 DIAGNOSIS — D509 Iron deficiency anemia, unspecified: Secondary | ICD-10-CM | POA: Diagnosis not present

## 2018-11-10 DIAGNOSIS — N186 End stage renal disease: Secondary | ICD-10-CM | POA: Diagnosis not present

## 2018-11-10 DIAGNOSIS — D509 Iron deficiency anemia, unspecified: Secondary | ICD-10-CM | POA: Diagnosis not present

## 2018-11-10 DIAGNOSIS — N2581 Secondary hyperparathyroidism of renal origin: Secondary | ICD-10-CM | POA: Diagnosis not present

## 2018-11-13 DIAGNOSIS — D509 Iron deficiency anemia, unspecified: Secondary | ICD-10-CM | POA: Diagnosis not present

## 2018-11-13 DIAGNOSIS — N186 End stage renal disease: Secondary | ICD-10-CM | POA: Diagnosis not present

## 2018-11-13 DIAGNOSIS — N2581 Secondary hyperparathyroidism of renal origin: Secondary | ICD-10-CM | POA: Diagnosis not present

## 2018-11-15 DIAGNOSIS — D509 Iron deficiency anemia, unspecified: Secondary | ICD-10-CM | POA: Diagnosis not present

## 2018-11-15 DIAGNOSIS — N2581 Secondary hyperparathyroidism of renal origin: Secondary | ICD-10-CM | POA: Diagnosis not present

## 2018-11-15 DIAGNOSIS — N186 End stage renal disease: Secondary | ICD-10-CM | POA: Diagnosis not present

## 2018-11-17 DIAGNOSIS — N186 End stage renal disease: Secondary | ICD-10-CM | POA: Diagnosis not present

## 2018-11-17 DIAGNOSIS — D509 Iron deficiency anemia, unspecified: Secondary | ICD-10-CM | POA: Diagnosis not present

## 2018-11-17 DIAGNOSIS — N2581 Secondary hyperparathyroidism of renal origin: Secondary | ICD-10-CM | POA: Diagnosis not present

## 2018-11-19 DIAGNOSIS — Z992 Dependence on renal dialysis: Secondary | ICD-10-CM | POA: Diagnosis not present

## 2018-11-19 DIAGNOSIS — I120 Hypertensive chronic kidney disease with stage 5 chronic kidney disease or end stage renal disease: Secondary | ICD-10-CM | POA: Diagnosis not present

## 2018-11-19 DIAGNOSIS — N186 End stage renal disease: Secondary | ICD-10-CM | POA: Diagnosis not present

## 2018-11-20 DIAGNOSIS — N186 End stage renal disease: Secondary | ICD-10-CM | POA: Diagnosis not present

## 2018-11-20 DIAGNOSIS — N2581 Secondary hyperparathyroidism of renal origin: Secondary | ICD-10-CM | POA: Diagnosis not present

## 2018-11-20 DIAGNOSIS — D509 Iron deficiency anemia, unspecified: Secondary | ICD-10-CM | POA: Diagnosis not present

## 2018-11-22 DIAGNOSIS — D509 Iron deficiency anemia, unspecified: Secondary | ICD-10-CM | POA: Diagnosis not present

## 2018-11-22 DIAGNOSIS — N2581 Secondary hyperparathyroidism of renal origin: Secondary | ICD-10-CM | POA: Diagnosis not present

## 2018-11-22 DIAGNOSIS — N186 End stage renal disease: Secondary | ICD-10-CM | POA: Diagnosis not present

## 2018-11-24 DIAGNOSIS — D509 Iron deficiency anemia, unspecified: Secondary | ICD-10-CM | POA: Diagnosis not present

## 2018-11-24 DIAGNOSIS — N186 End stage renal disease: Secondary | ICD-10-CM | POA: Diagnosis not present

## 2018-11-24 DIAGNOSIS — N2581 Secondary hyperparathyroidism of renal origin: Secondary | ICD-10-CM | POA: Diagnosis not present

## 2018-11-27 DIAGNOSIS — D509 Iron deficiency anemia, unspecified: Secondary | ICD-10-CM | POA: Diagnosis not present

## 2018-11-27 DIAGNOSIS — N186 End stage renal disease: Secondary | ICD-10-CM | POA: Diagnosis not present

## 2018-11-27 DIAGNOSIS — N2581 Secondary hyperparathyroidism of renal origin: Secondary | ICD-10-CM | POA: Diagnosis not present

## 2018-11-29 DIAGNOSIS — N2581 Secondary hyperparathyroidism of renal origin: Secondary | ICD-10-CM | POA: Diagnosis not present

## 2018-11-29 DIAGNOSIS — D509 Iron deficiency anemia, unspecified: Secondary | ICD-10-CM | POA: Diagnosis not present

## 2018-11-29 DIAGNOSIS — N186 End stage renal disease: Secondary | ICD-10-CM | POA: Diagnosis not present

## 2018-12-01 DIAGNOSIS — N186 End stage renal disease: Secondary | ICD-10-CM | POA: Diagnosis not present

## 2018-12-01 DIAGNOSIS — N2581 Secondary hyperparathyroidism of renal origin: Secondary | ICD-10-CM | POA: Diagnosis not present

## 2018-12-01 DIAGNOSIS — D509 Iron deficiency anemia, unspecified: Secondary | ICD-10-CM | POA: Diagnosis not present

## 2018-12-04 DIAGNOSIS — N186 End stage renal disease: Secondary | ICD-10-CM | POA: Diagnosis not present

## 2018-12-04 DIAGNOSIS — N2581 Secondary hyperparathyroidism of renal origin: Secondary | ICD-10-CM | POA: Diagnosis not present

## 2018-12-04 DIAGNOSIS — D509 Iron deficiency anemia, unspecified: Secondary | ICD-10-CM | POA: Diagnosis not present

## 2018-12-06 DIAGNOSIS — N2581 Secondary hyperparathyroidism of renal origin: Secondary | ICD-10-CM | POA: Diagnosis not present

## 2018-12-06 DIAGNOSIS — D509 Iron deficiency anemia, unspecified: Secondary | ICD-10-CM | POA: Diagnosis not present

## 2018-12-06 DIAGNOSIS — N186 End stage renal disease: Secondary | ICD-10-CM | POA: Diagnosis not present

## 2018-12-08 DIAGNOSIS — N186 End stage renal disease: Secondary | ICD-10-CM | POA: Diagnosis not present

## 2018-12-08 DIAGNOSIS — D509 Iron deficiency anemia, unspecified: Secondary | ICD-10-CM | POA: Diagnosis not present

## 2018-12-08 DIAGNOSIS — N2581 Secondary hyperparathyroidism of renal origin: Secondary | ICD-10-CM | POA: Diagnosis not present

## 2018-12-11 DIAGNOSIS — D509 Iron deficiency anemia, unspecified: Secondary | ICD-10-CM | POA: Diagnosis not present

## 2018-12-11 DIAGNOSIS — N2581 Secondary hyperparathyroidism of renal origin: Secondary | ICD-10-CM | POA: Diagnosis not present

## 2018-12-11 DIAGNOSIS — N186 End stage renal disease: Secondary | ICD-10-CM | POA: Diagnosis not present

## 2018-12-13 DIAGNOSIS — N186 End stage renal disease: Secondary | ICD-10-CM | POA: Diagnosis not present

## 2018-12-13 DIAGNOSIS — D509 Iron deficiency anemia, unspecified: Secondary | ICD-10-CM | POA: Diagnosis not present

## 2018-12-13 DIAGNOSIS — N2581 Secondary hyperparathyroidism of renal origin: Secondary | ICD-10-CM | POA: Diagnosis not present

## 2018-12-15 DIAGNOSIS — N2581 Secondary hyperparathyroidism of renal origin: Secondary | ICD-10-CM | POA: Diagnosis not present

## 2018-12-15 DIAGNOSIS — N186 End stage renal disease: Secondary | ICD-10-CM | POA: Diagnosis not present

## 2018-12-15 DIAGNOSIS — D509 Iron deficiency anemia, unspecified: Secondary | ICD-10-CM | POA: Diagnosis not present

## 2018-12-18 DIAGNOSIS — N2581 Secondary hyperparathyroidism of renal origin: Secondary | ICD-10-CM | POA: Diagnosis not present

## 2018-12-18 DIAGNOSIS — N186 End stage renal disease: Secondary | ICD-10-CM | POA: Diagnosis not present

## 2018-12-18 DIAGNOSIS — D509 Iron deficiency anemia, unspecified: Secondary | ICD-10-CM | POA: Diagnosis not present

## 2018-12-20 DIAGNOSIS — Z992 Dependence on renal dialysis: Secondary | ICD-10-CM | POA: Diagnosis not present

## 2018-12-20 DIAGNOSIS — N2581 Secondary hyperparathyroidism of renal origin: Secondary | ICD-10-CM | POA: Diagnosis not present

## 2018-12-20 DIAGNOSIS — D509 Iron deficiency anemia, unspecified: Secondary | ICD-10-CM | POA: Diagnosis not present

## 2018-12-20 DIAGNOSIS — N186 End stage renal disease: Secondary | ICD-10-CM | POA: Diagnosis not present

## 2018-12-20 DIAGNOSIS — I120 Hypertensive chronic kidney disease with stage 5 chronic kidney disease or end stage renal disease: Secondary | ICD-10-CM | POA: Diagnosis not present

## 2018-12-22 DIAGNOSIS — N2581 Secondary hyperparathyroidism of renal origin: Secondary | ICD-10-CM | POA: Diagnosis not present

## 2018-12-22 DIAGNOSIS — D509 Iron deficiency anemia, unspecified: Secondary | ICD-10-CM | POA: Diagnosis not present

## 2018-12-22 DIAGNOSIS — N186 End stage renal disease: Secondary | ICD-10-CM | POA: Diagnosis not present

## 2018-12-25 DIAGNOSIS — D509 Iron deficiency anemia, unspecified: Secondary | ICD-10-CM | POA: Diagnosis not present

## 2018-12-25 DIAGNOSIS — N186 End stage renal disease: Secondary | ICD-10-CM | POA: Diagnosis not present

## 2018-12-25 DIAGNOSIS — N2581 Secondary hyperparathyroidism of renal origin: Secondary | ICD-10-CM | POA: Diagnosis not present

## 2018-12-27 DIAGNOSIS — D509 Iron deficiency anemia, unspecified: Secondary | ICD-10-CM | POA: Diagnosis not present

## 2018-12-27 DIAGNOSIS — N186 End stage renal disease: Secondary | ICD-10-CM | POA: Diagnosis not present

## 2018-12-27 DIAGNOSIS — N2581 Secondary hyperparathyroidism of renal origin: Secondary | ICD-10-CM | POA: Diagnosis not present

## 2018-12-29 DIAGNOSIS — N186 End stage renal disease: Secondary | ICD-10-CM | POA: Diagnosis not present

## 2018-12-29 DIAGNOSIS — N2581 Secondary hyperparathyroidism of renal origin: Secondary | ICD-10-CM | POA: Diagnosis not present

## 2018-12-29 DIAGNOSIS — D509 Iron deficiency anemia, unspecified: Secondary | ICD-10-CM | POA: Diagnosis not present

## 2019-01-01 DIAGNOSIS — D509 Iron deficiency anemia, unspecified: Secondary | ICD-10-CM | POA: Diagnosis not present

## 2019-01-01 DIAGNOSIS — N2581 Secondary hyperparathyroidism of renal origin: Secondary | ICD-10-CM | POA: Diagnosis not present

## 2019-01-01 DIAGNOSIS — N186 End stage renal disease: Secondary | ICD-10-CM | POA: Diagnosis not present

## 2019-01-02 DIAGNOSIS — T82858A Stenosis of vascular prosthetic devices, implants and grafts, initial encounter: Secondary | ICD-10-CM | POA: Diagnosis not present

## 2019-01-02 DIAGNOSIS — I871 Compression of vein: Secondary | ICD-10-CM | POA: Diagnosis not present

## 2019-01-02 DIAGNOSIS — Z992 Dependence on renal dialysis: Secondary | ICD-10-CM | POA: Diagnosis not present

## 2019-01-02 DIAGNOSIS — N186 End stage renal disease: Secondary | ICD-10-CM | POA: Diagnosis not present

## 2019-01-03 DIAGNOSIS — N186 End stage renal disease: Secondary | ICD-10-CM | POA: Diagnosis not present

## 2019-01-03 DIAGNOSIS — N2581 Secondary hyperparathyroidism of renal origin: Secondary | ICD-10-CM | POA: Diagnosis not present

## 2019-01-03 DIAGNOSIS — D509 Iron deficiency anemia, unspecified: Secondary | ICD-10-CM | POA: Diagnosis not present

## 2019-01-05 DIAGNOSIS — D509 Iron deficiency anemia, unspecified: Secondary | ICD-10-CM | POA: Diagnosis not present

## 2019-01-05 DIAGNOSIS — N186 End stage renal disease: Secondary | ICD-10-CM | POA: Diagnosis not present

## 2019-01-05 DIAGNOSIS — N2581 Secondary hyperparathyroidism of renal origin: Secondary | ICD-10-CM | POA: Diagnosis not present

## 2019-01-08 DIAGNOSIS — N186 End stage renal disease: Secondary | ICD-10-CM | POA: Diagnosis not present

## 2019-01-08 DIAGNOSIS — D509 Iron deficiency anemia, unspecified: Secondary | ICD-10-CM | POA: Diagnosis not present

## 2019-01-08 DIAGNOSIS — N2581 Secondary hyperparathyroidism of renal origin: Secondary | ICD-10-CM | POA: Diagnosis not present

## 2019-01-10 DIAGNOSIS — N2581 Secondary hyperparathyroidism of renal origin: Secondary | ICD-10-CM | POA: Diagnosis not present

## 2019-01-10 DIAGNOSIS — N186 End stage renal disease: Secondary | ICD-10-CM | POA: Diagnosis not present

## 2019-01-10 DIAGNOSIS — D509 Iron deficiency anemia, unspecified: Secondary | ICD-10-CM | POA: Diagnosis not present

## 2019-01-12 DIAGNOSIS — N186 End stage renal disease: Secondary | ICD-10-CM | POA: Diagnosis not present

## 2019-01-12 DIAGNOSIS — D509 Iron deficiency anemia, unspecified: Secondary | ICD-10-CM | POA: Diagnosis not present

## 2019-01-12 DIAGNOSIS — N2581 Secondary hyperparathyroidism of renal origin: Secondary | ICD-10-CM | POA: Diagnosis not present

## 2019-01-15 DIAGNOSIS — D509 Iron deficiency anemia, unspecified: Secondary | ICD-10-CM | POA: Diagnosis not present

## 2019-01-15 DIAGNOSIS — N186 End stage renal disease: Secondary | ICD-10-CM | POA: Diagnosis not present

## 2019-01-15 DIAGNOSIS — N2581 Secondary hyperparathyroidism of renal origin: Secondary | ICD-10-CM | POA: Diagnosis not present

## 2019-01-16 DIAGNOSIS — H9313 Tinnitus, bilateral: Secondary | ICD-10-CM | POA: Insufficient documentation

## 2019-01-16 DIAGNOSIS — Z7289 Other problems related to lifestyle: Secondary | ICD-10-CM | POA: Diagnosis not present

## 2019-01-16 DIAGNOSIS — J343 Hypertrophy of nasal turbinates: Secondary | ICD-10-CM | POA: Diagnosis not present

## 2019-01-16 DIAGNOSIS — H903 Sensorineural hearing loss, bilateral: Secondary | ICD-10-CM | POA: Diagnosis not present

## 2019-01-17 DIAGNOSIS — D509 Iron deficiency anemia, unspecified: Secondary | ICD-10-CM | POA: Diagnosis not present

## 2019-01-17 DIAGNOSIS — N186 End stage renal disease: Secondary | ICD-10-CM | POA: Diagnosis not present

## 2019-01-17 DIAGNOSIS — N2581 Secondary hyperparathyroidism of renal origin: Secondary | ICD-10-CM | POA: Diagnosis not present

## 2019-01-19 DIAGNOSIS — I120 Hypertensive chronic kidney disease with stage 5 chronic kidney disease or end stage renal disease: Secondary | ICD-10-CM | POA: Diagnosis not present

## 2019-01-19 DIAGNOSIS — N186 End stage renal disease: Secondary | ICD-10-CM | POA: Diagnosis not present

## 2019-01-19 DIAGNOSIS — N2581 Secondary hyperparathyroidism of renal origin: Secondary | ICD-10-CM | POA: Diagnosis not present

## 2019-01-19 DIAGNOSIS — Z992 Dependence on renal dialysis: Secondary | ICD-10-CM | POA: Diagnosis not present

## 2019-01-19 DIAGNOSIS — D509 Iron deficiency anemia, unspecified: Secondary | ICD-10-CM | POA: Diagnosis not present

## 2019-01-22 DIAGNOSIS — N2581 Secondary hyperparathyroidism of renal origin: Secondary | ICD-10-CM | POA: Diagnosis not present

## 2019-01-22 DIAGNOSIS — N186 End stage renal disease: Secondary | ICD-10-CM | POA: Diagnosis not present

## 2019-01-22 DIAGNOSIS — D509 Iron deficiency anemia, unspecified: Secondary | ICD-10-CM | POA: Diagnosis not present

## 2019-01-24 DIAGNOSIS — N2581 Secondary hyperparathyroidism of renal origin: Secondary | ICD-10-CM | POA: Diagnosis not present

## 2019-01-24 DIAGNOSIS — D509 Iron deficiency anemia, unspecified: Secondary | ICD-10-CM | POA: Diagnosis not present

## 2019-01-24 DIAGNOSIS — N186 End stage renal disease: Secondary | ICD-10-CM | POA: Diagnosis not present

## 2019-01-26 DIAGNOSIS — D509 Iron deficiency anemia, unspecified: Secondary | ICD-10-CM | POA: Diagnosis not present

## 2019-01-26 DIAGNOSIS — N186 End stage renal disease: Secondary | ICD-10-CM | POA: Diagnosis not present

## 2019-01-26 DIAGNOSIS — N2581 Secondary hyperparathyroidism of renal origin: Secondary | ICD-10-CM | POA: Diagnosis not present

## 2019-01-29 DIAGNOSIS — N186 End stage renal disease: Secondary | ICD-10-CM | POA: Diagnosis not present

## 2019-01-29 DIAGNOSIS — D509 Iron deficiency anemia, unspecified: Secondary | ICD-10-CM | POA: Diagnosis not present

## 2019-01-29 DIAGNOSIS — N2581 Secondary hyperparathyroidism of renal origin: Secondary | ICD-10-CM | POA: Diagnosis not present

## 2019-01-31 DIAGNOSIS — N186 End stage renal disease: Secondary | ICD-10-CM | POA: Diagnosis not present

## 2019-01-31 DIAGNOSIS — D509 Iron deficiency anemia, unspecified: Secondary | ICD-10-CM | POA: Diagnosis not present

## 2019-01-31 DIAGNOSIS — N2581 Secondary hyperparathyroidism of renal origin: Secondary | ICD-10-CM | POA: Diagnosis not present

## 2019-02-02 DIAGNOSIS — D509 Iron deficiency anemia, unspecified: Secondary | ICD-10-CM | POA: Diagnosis not present

## 2019-02-02 DIAGNOSIS — N186 End stage renal disease: Secondary | ICD-10-CM | POA: Diagnosis not present

## 2019-02-02 DIAGNOSIS — N2581 Secondary hyperparathyroidism of renal origin: Secondary | ICD-10-CM | POA: Diagnosis not present

## 2019-02-05 DIAGNOSIS — N186 End stage renal disease: Secondary | ICD-10-CM | POA: Diagnosis not present

## 2019-02-05 DIAGNOSIS — N2581 Secondary hyperparathyroidism of renal origin: Secondary | ICD-10-CM | POA: Diagnosis not present

## 2019-02-05 DIAGNOSIS — D509 Iron deficiency anemia, unspecified: Secondary | ICD-10-CM | POA: Diagnosis not present

## 2019-02-07 DIAGNOSIS — N186 End stage renal disease: Secondary | ICD-10-CM | POA: Diagnosis not present

## 2019-02-07 DIAGNOSIS — N2581 Secondary hyperparathyroidism of renal origin: Secondary | ICD-10-CM | POA: Diagnosis not present

## 2019-02-07 DIAGNOSIS — D509 Iron deficiency anemia, unspecified: Secondary | ICD-10-CM | POA: Diagnosis not present

## 2019-02-09 DIAGNOSIS — N2581 Secondary hyperparathyroidism of renal origin: Secondary | ICD-10-CM | POA: Diagnosis not present

## 2019-02-09 DIAGNOSIS — N186 End stage renal disease: Secondary | ICD-10-CM | POA: Diagnosis not present

## 2019-02-09 DIAGNOSIS — D509 Iron deficiency anemia, unspecified: Secondary | ICD-10-CM | POA: Diagnosis not present

## 2019-02-12 DIAGNOSIS — N186 End stage renal disease: Secondary | ICD-10-CM | POA: Diagnosis not present

## 2019-02-12 DIAGNOSIS — N2581 Secondary hyperparathyroidism of renal origin: Secondary | ICD-10-CM | POA: Diagnosis not present

## 2019-02-12 DIAGNOSIS — D509 Iron deficiency anemia, unspecified: Secondary | ICD-10-CM | POA: Diagnosis not present

## 2019-02-14 DIAGNOSIS — D509 Iron deficiency anemia, unspecified: Secondary | ICD-10-CM | POA: Diagnosis not present

## 2019-02-14 DIAGNOSIS — N2581 Secondary hyperparathyroidism of renal origin: Secondary | ICD-10-CM | POA: Diagnosis not present

## 2019-02-14 DIAGNOSIS — N186 End stage renal disease: Secondary | ICD-10-CM | POA: Diagnosis not present

## 2019-02-16 DIAGNOSIS — D509 Iron deficiency anemia, unspecified: Secondary | ICD-10-CM | POA: Diagnosis not present

## 2019-02-16 DIAGNOSIS — N2581 Secondary hyperparathyroidism of renal origin: Secondary | ICD-10-CM | POA: Diagnosis not present

## 2019-02-16 DIAGNOSIS — N186 End stage renal disease: Secondary | ICD-10-CM | POA: Diagnosis not present

## 2019-02-19 DIAGNOSIS — N186 End stage renal disease: Secondary | ICD-10-CM | POA: Diagnosis not present

## 2019-02-19 DIAGNOSIS — I120 Hypertensive chronic kidney disease with stage 5 chronic kidney disease or end stage renal disease: Secondary | ICD-10-CM | POA: Diagnosis not present

## 2019-02-19 DIAGNOSIS — Z992 Dependence on renal dialysis: Secondary | ICD-10-CM | POA: Diagnosis not present

## 2019-02-19 DIAGNOSIS — N2581 Secondary hyperparathyroidism of renal origin: Secondary | ICD-10-CM | POA: Diagnosis not present

## 2019-02-19 DIAGNOSIS — D509 Iron deficiency anemia, unspecified: Secondary | ICD-10-CM | POA: Diagnosis not present

## 2019-02-21 DIAGNOSIS — D509 Iron deficiency anemia, unspecified: Secondary | ICD-10-CM | POA: Diagnosis not present

## 2019-02-21 DIAGNOSIS — N186 End stage renal disease: Secondary | ICD-10-CM | POA: Diagnosis not present

## 2019-02-21 DIAGNOSIS — N2581 Secondary hyperparathyroidism of renal origin: Secondary | ICD-10-CM | POA: Diagnosis not present

## 2019-02-23 DIAGNOSIS — N2581 Secondary hyperparathyroidism of renal origin: Secondary | ICD-10-CM | POA: Diagnosis not present

## 2019-02-23 DIAGNOSIS — D509 Iron deficiency anemia, unspecified: Secondary | ICD-10-CM | POA: Diagnosis not present

## 2019-02-23 DIAGNOSIS — N186 End stage renal disease: Secondary | ICD-10-CM | POA: Diagnosis not present

## 2019-02-26 DIAGNOSIS — N2581 Secondary hyperparathyroidism of renal origin: Secondary | ICD-10-CM | POA: Diagnosis not present

## 2019-02-26 DIAGNOSIS — G5603 Carpal tunnel syndrome, bilateral upper limbs: Secondary | ICD-10-CM | POA: Insufficient documentation

## 2019-02-26 DIAGNOSIS — M65342 Trigger finger, left ring finger: Secondary | ICD-10-CM | POA: Diagnosis not present

## 2019-02-26 DIAGNOSIS — R200 Anesthesia of skin: Secondary | ICD-10-CM | POA: Insufficient documentation

## 2019-02-26 DIAGNOSIS — R52 Pain, unspecified: Secondary | ICD-10-CM | POA: Diagnosis not present

## 2019-02-26 DIAGNOSIS — M65321 Trigger finger, right index finger: Secondary | ICD-10-CM | POA: Diagnosis not present

## 2019-02-26 DIAGNOSIS — D509 Iron deficiency anemia, unspecified: Secondary | ICD-10-CM | POA: Diagnosis not present

## 2019-02-26 DIAGNOSIS — N186 End stage renal disease: Secondary | ICD-10-CM | POA: Diagnosis not present

## 2019-02-28 DIAGNOSIS — N2581 Secondary hyperparathyroidism of renal origin: Secondary | ICD-10-CM | POA: Diagnosis not present

## 2019-02-28 DIAGNOSIS — N186 End stage renal disease: Secondary | ICD-10-CM | POA: Diagnosis not present

## 2019-02-28 DIAGNOSIS — D509 Iron deficiency anemia, unspecified: Secondary | ICD-10-CM | POA: Diagnosis not present

## 2019-03-01 ENCOUNTER — Emergency Department (HOSPITAL_COMMUNITY)
Admission: EM | Admit: 2019-03-01 | Discharge: 2019-03-01 | Disposition: A | Discharge status: Home / Self Care | Payer: Medicare Other | Attending: Emergency Medicine | Admitting: Emergency Medicine

## 2019-03-01 ENCOUNTER — Other Ambulatory Visit: Payer: Self-pay

## 2019-03-01 ENCOUNTER — Emergency Department (HOSPITAL_COMMUNITY): Payer: Medicare Other

## 2019-03-01 ENCOUNTER — Encounter (HOSPITAL_COMMUNITY): Payer: Self-pay

## 2019-03-01 DIAGNOSIS — N186 End stage renal disease: Secondary | ICD-10-CM | POA: Diagnosis not present

## 2019-03-01 DIAGNOSIS — R1011 Right upper quadrant pain: Secondary | ICD-10-CM | POA: Diagnosis not present

## 2019-03-01 DIAGNOSIS — Z20828 Contact with and (suspected) exposure to other viral communicable diseases: Secondary | ICD-10-CM | POA: Diagnosis not present

## 2019-03-01 DIAGNOSIS — I120 Hypertensive chronic kidney disease with stage 5 chronic kidney disease or end stage renal disease: Secondary | ICD-10-CM | POA: Insufficient documentation

## 2019-03-01 DIAGNOSIS — Z992 Dependence on renal dialysis: Secondary | ICD-10-CM | POA: Diagnosis not present

## 2019-03-01 DIAGNOSIS — R112 Nausea with vomiting, unspecified: Secondary | ICD-10-CM | POA: Diagnosis present

## 2019-03-01 LAB — CBC WITH DIFFERENTIAL/PLATELET
Abs Immature Granulocytes: 0.09 10*3/uL — ABNORMAL HIGH (ref 0.00–0.07)
Basophils Absolute: 0.1 10*3/uL (ref 0.0–0.1)
Basophils Relative: 1 %
Eosinophils Absolute: 0.1 10*3/uL (ref 0.0–0.5)
Eosinophils Relative: 1 %
HCT: 50.9 % — ABNORMAL HIGH (ref 36.0–46.0)
Hemoglobin: 16.1 g/dL — ABNORMAL HIGH (ref 12.0–15.0)
Immature Granulocytes: 1 %
Lymphocytes Relative: 12 %
Lymphs Abs: 1.8 10*3/uL (ref 0.7–4.0)
MCH: 26.6 pg (ref 26.0–34.0)
MCHC: 31.6 g/dL (ref 30.0–36.0)
MCV: 84.1 fL (ref 80.0–100.0)
Monocytes Absolute: 0.7 10*3/uL (ref 0.1–1.0)
Monocytes Relative: 5 %
Neutro Abs: 12.4 10*3/uL — ABNORMAL HIGH (ref 1.7–7.7)
Neutrophils Relative %: 80 %
Platelets: 228 10*3/uL (ref 150–400)
RBC: 6.05 MIL/uL — ABNORMAL HIGH (ref 3.87–5.11)
RDW: 15.4 % (ref 11.5–15.5)
WBC: 15.1 10*3/uL — ABNORMAL HIGH (ref 4.0–10.5)
nRBC: 0 % (ref 0.0–0.2)

## 2019-03-01 LAB — COMPREHENSIVE METABOLIC PANEL
ALT: 14 U/L (ref 0–44)
AST: 20 U/L (ref 15–41)
Albumin: 3.6 g/dL (ref 3.5–5.0)
Alkaline Phosphatase: 41 U/L (ref 38–126)
Anion gap: 17 — ABNORMAL HIGH (ref 5–15)
BUN: 85 mg/dL — ABNORMAL HIGH (ref 8–23)
CO2: 20 mmol/L — ABNORMAL LOW (ref 22–32)
Calcium: 9 mg/dL (ref 8.9–10.3)
Chloride: 106 mmol/L (ref 98–111)
Creatinine, Ser: 11.5 mg/dL — ABNORMAL HIGH (ref 0.44–1.00)
GFR calc Af Amer: 4 mL/min — ABNORMAL LOW (ref >60)
GFR calc non Af Amer: 3 mL/min — ABNORMAL LOW (ref >60)
Glucose, Bld: 103 mg/dL — ABNORMAL HIGH (ref 70–99)
Potassium: 4.1 mmol/L (ref 3.5–5.1)
Sodium: 143 mmol/L (ref 135–145)
Total Bilirubin: 0.8 mg/dL (ref 0.3–1.2)
Total Protein: 6.8 g/dL (ref 6.5–8.1)

## 2019-03-01 LAB — LIPASE, BLOOD: Lipase: 56 U/L — ABNORMAL HIGH (ref 11–51)

## 2019-03-01 MED ORDER — PROMETHAZINE HCL 25 MG PO TABS
25.0000 mg | ORAL_TABLET | Freq: Once | ORAL | Status: AC
  Administered 2019-03-01: 25 mg via ORAL
  Filled 2019-03-01: qty 1

## 2019-03-01 MED ORDER — ALUM & MAG HYDROXIDE-SIMETH 200-200-20 MG/5ML PO SUSP
30.0000 mL | Freq: Once | ORAL | Status: AC
  Administered 2019-03-01: 30 mL via ORAL
  Filled 2019-03-01: qty 30

## 2019-03-01 MED ORDER — ONDANSETRON 4 MG PO TBDP
4.0000 mg | ORAL_TABLET | Freq: Once | ORAL | Status: AC
Start: 2019-03-01 — End: 2019-03-01
  Administered 2019-03-01: 4 mg via ORAL
  Filled 2019-03-01: qty 1

## 2019-03-01 MED ORDER — ONDANSETRON HCL 4 MG PO TABS: 4.0000 mg | ORAL_TABLET | Freq: Three times a day (TID) | ORAL | 0 refills | Status: DC | PRN

## 2019-03-01 NOTE — ED Notes (Signed)
Patient verbalizes understanding of discharge instructions. Opportunity for questioning and answers were provided. Armband removed by staff, pt discharged from ED.  

## 2019-03-01 NOTE — ED Notes (Signed)
Pt is on dialysis and does not make urine.

## 2019-03-01 NOTE — ED Triage Notes (Signed)
Took her calcium pills on an empty stomach around 11pm and didn't eat anything.  She is now having nausea  And uncomfortable.  Reports stomach cramping

## 2019-03-01 NOTE — ED Provider Notes (Signed)
[facility] EMERGENCY DEPARTMENT Provider Note   CSN: [id number] Arrival date & time: 03/01/19  1657     History   Chief Complaint Chief Complaint  Patient presents with  . Nausea    HPI Victoria [name] is a 63 y.o. female.     The history is provided by the patient and medical records. No language interpreter was used.     63 year old female with history of end-stage renal disease currently on Monday Wednesday Friday dialysis, GERD, hypertension presenting for evaluation of nausea.  Patient report approximately 6 hours ago she took 2 calcium pill on empty stomach.  A few hours later she developed extreme nausea, upper abdominal discomfort that she felt was related to taking the medication without food.  She now endorsed crampy upper abdominal discomfort moderate in severity as well as vomiting up clear emesis.  Patient states she felt bad.  She does not complain of fever or chills, no chest pain or shortness of breath or productive cough.  Her last dialysis session was yesterday.  No recent sick contact.  Able to pass flatus and have normal bowel movement.  Past Medical History:  Diagnosis Date  . Chronic bronchitis ([place])   . Complication of anesthesia ~ 2011   "they gave me a medicine that swolled me and mouth burning up" (08/23/2012)  . Complication of anesthesia    slow to wake up  . ESRD (end stage renal disease) on dialysis [facility]) Nephrologist-- dr deterding   ESRD due to HTN-; "M/W/F; [place]" (11/28/2015  . GERD (gastroesophageal reflux disease)   . History of acute pulmonary edema    2003  . Hypertension    no longer on medications  . Left patella fracture   . Pneumonia    as a child  . Seasonal allergies   . Secondary hyperparathyroidism, renal ([place])    s/p  total parathyroidectomy  2014  . Thyroid disease   . Wears glasses     Patient Active Problem List   Diagnosis Date Noted  . Dehiscence of operative wound 11/28/2015  . Wound  dehiscence, surgical 11/28/2015  . Renal dialysis device, implant, or graft complication 17/61/6073  . Aftercare following surgery of the circulatory system, [place] 03/30/2013  . Mechanical complication of other vascular device, implant, and graft 01/12/2013  . Other and unspecified hyperlipidemia 10/25/2012  . End stage renal disease ([place]) 09/22/2012  . Chest pain, mid sternal 08/23/2012  . Hyperparathyroidism, secondary renal ([place]) 03/22/2011  . Thyroid nodule 03/22/2011  . High blood pressure 03/10/2011  . Kidney disease 03/10/2011    Past Surgical History:  Procedure Laterality Date  . ANKLE FRACTURE SURGERY Right ~ 2005   "has pins in it"  . APPLICATION OF WOUND VAC Left 11/28/2015   knee  . APPLICATION OF WOUND VAC Left 11/28/2015   Procedure: APPLICATION OF WOUND VAC;  Surgeon: Rod Can, MD;  Location: [place];  Service: Orthopedics;  Laterality: Left;  . ARTERIOVENOUS GRAFT PLACEMENT  03-25-2005   Right forearm  w/  multiple Revision's   . CARDIOVASCULAR STRESS TEST  08-24-2012   abnormal nuclear study/  inferolateral and anteroseptal areas of scar,  no ischemia/  normal LV function and wall motion, ef 77%  . DIALYSIS FISTULA CREATION  1992   left upper arm ---  w/  Multiple Revision's until 2006  . ECTOPIC PREGNANCY SURGERY  1970's  . FRACTURE SURGERY    . I&D EXTREMITY Left 11/28/2015   Procedure: IRRIGATION AND  DEBRIDEMENT LEFT KNEE WOUND ;  Surgeon: Rod Can, MD;  Location: [place];  Service: Orthopedics;  Laterality: Left;  . INCISION AND DRAINAGE OF WOUND Left 11/28/2015   knee  . ORIF PATELLA Left 10/31/2015   Procedure: OPEN REDUCTION INTERNAL (ORIF) FIXATION LEFT PATELLA;  Surgeon: Rod Can, MD;  Location: [place];  Service: Orthopedics;  Laterality: Left;  . PATELLAR TENDON REPAIR  10/03/2012   Procedure: PATELLA TENDON REPAIR;  Surgeon: Marin Shutter, MD;  Location: [place];  Service: Orthopedics;  Laterality: Right;  . PATELLECTOMY   10/03/2012   Procedure: PATELLECTOMY;  Surgeon: Marin Shutter, MD;  Location: [place];  Service: Orthopedics;  Laterality: Right;  RIGHT PARTIAL PATELLECTOMY AND PATELLA TENDON REPAIR  . REVISION OF ARTERIOVENOUS GORETEX GRAFT  09/27/2012   Procedure: REVISION OF ARTERIOVENOUS GORETEX GRAFT;  Surgeon: Mal Misty, MD;  Location: [place];  Service: Vascular;  Laterality: Right;  1) Replacement of venous half of loop with 67mm Gortex graft  2) Excision of erroded pseudoaneurysm of graft with primary closure.  Marland Kitchen REVISION OF ARTERIOVENOUS GORETEX GRAFT Right 01/23/2013   Procedure: REVISION OF ARTERIOVENOUS GORETEX GRAFT;  Surgeon: Conrad [place], MD;  Location: [facility] OR;  Service: Vascular;  Laterality: Right;  Using piece of 36mm x 20cm Gortex graft.   Marland Kitchen REVISION OF ARTERIOVENOUS GORETEX GRAFT Right 10/07/2015   Procedure: REVISION OF Right arm ARTERIOVENOUS GORETEX GRAFT;  Surgeon: Elam Dutch, MD;  Location: [facility] OR;  Service: Vascular;  Laterality: Right;  . REVISION OF ARTERIOVENOUS GORETEX GRAFT Right 02/17/2016   Procedure: REVISION OF ARTERIOVENOUS GORETEX GRAFT;  Surgeon: Elam Dutch, MD;  Location: [place];  Service: Vascular;  Laterality: Right;  . TOTAL ABDOMINAL HYSTERECTOMY  03-05-2005   w/  Right Ovarian Cystectomy  . TOTAL PARATHYROIDECTOMY/  THYROID ISTHMUSECTOMY/  AUTOTRANSPLANTATION PARATHYROID TISSUE TO LEFT BRACHIORIADIALIS MUSCLE  02-18-2011  . TRANSTHORACIC ECHOCARDIOGRAM  10-31-2012   mild LVH,  grade 1 diastolic dysfunction,  ef 55-65%/  mild MR/  trivial TR     OB History   No obstetric history on file.      Home Medications    Prior to Admission medications   Medication Sig Start Date End Date Taking? Authorizing Provider  calcium acetate (PHOSLO) 667 MG capsule Take 2,001 mg by mouth 3 (three) times daily with meals.     [provider]  cefdinir (OMNICEF) 300 MG capsule Take 2 capsules (600 mg total) by mouth daily. 09/29/18   Robyn Haber, MD  predniSONE  (DELTASONE) 50 MG tablet One daily with food 09/29/18   Robyn Haber, MD    Family History Family History  Problem Relation Age of Onset  . Healthy Mother   . Hypertension Father   . Kidney failure Father   . Hypertension Sister   . Thyroid disease Sister     Social History Social History   Tobacco Use  . Smoking status: Never Smoker  . Smokeless tobacco: Never Used  Substance Use Topics  . Alcohol use: No    Alcohol/week: 0.0 standard drinks    Comment: quit 2007  . Drug use: No     Allergies   Amlodipine and Lisinopril   Review of Systems Review of Systems  All other systems reviewed and are negative.    Physical Exam Updated Vital Signs BP (!) 128/91 (BP Location: Left Arm)   Pulse 94   Temp 97.6 F (36.4 C) (Oral)   Resp 16  Ht 5\' 3"  (1.6 m)   Wt 86.2 kg   SpO2 98%   BMI 33.66 kg/m   Physical Exam Vitals signs and nursing note reviewed.  Constitutional:      General: She is not in acute distress.    Appearance: She is well-developed. She is obese.  HENT:     Head: Atraumatic.     Mouth/Throat:     Mouth: Mucous membranes are moist.  Eyes:     Conjunctiva/sclera: Conjunctivae normal.  Neck:     Musculoskeletal: Neck supple.  Cardiovascular:     Rate and Rhythm: Normal rate and regular rhythm.     Pulses: Normal pulses.     Heart sounds: Normal heart sounds.  Pulmonary:     Effort: Pulmonary effort is normal.     Breath sounds: Normal breath sounds. No wheezing or rales.  Abdominal:     General: Bowel sounds are normal.     Palpations: Abdomen is soft.     Tenderness: There is abdominal tenderness (Mild epigastric tenderness without guarding or rebound tenderness.  Negative Murphy sign, no pain at McBurney's point.).  Skin:    Findings: No rash.  Neurological:     Mental Status: She is alert and oriented to person, place, and time.  Psychiatric:        Mood and Affect: Mood normal.      ED Treatments / Results  Labs (all labs  ordered are listed, but only abnormal results are displayed) Labs Reviewed  CBC WITH DIFFERENTIAL/PLATELET - Abnormal; Notable for the following components:      Result Value   WBC 15.1 (*)    RBC 6.05 (*)    Hemoglobin 16.1 (*)    HCT 50.9 (*)    Neutro Abs 12.4 (*)    Abs Immature Granulocytes 0.09 (*)    All other components within normal limits  COMPREHENSIVE METABOLIC PANEL - Abnormal; Notable for the following components:   CO2 20 (*)    Glucose, Bld 103 (*)    BUN 85 (*)    Creatinine, Ser 11.50 (*)    GFR calc non Af Amer 3 (*)    GFR calc Af Amer 4 (*)    Anion gap 17 (*)    All other components within normal limits  LIPASE, BLOOD - Abnormal; Notable for the following components:   Lipase 56 (*)    All other components within normal limits    EKG    Radiology US Abdomen Limited  Result Date: 03/01/2019 CLINICAL DATA:  Right upper quadrant pain EXAM: ULTRASOUND ABDOMEN LIMITED RIGHT UPPER QUADRANT COMPARISON:  CT 03/01/2017 FINDINGS: Gallbladder: No shadowing stones. Focal wall thickening up to 6 mm. Negative sonographic Murphy. Common bile duct: Diameter: 2.9 mm Liver: Increased echogenicity. 1.4 cm cyst in the right hepatic lobe. 1.2 cm echogenic focus in the right lobe with some shadowing, likely corresponding to faint calcification noted on CT. Portal vein is patent on color Doppler imaging with normal direction of blood flow towards the liver. IMPRESSION: 1. Negative for gallstones. Nonspecific focal wall thickening up to 6 mm without other sonographic features to suggest acute cholecystitis 2. Echogenic liver consistent with steatosis and or hepatocellular disease. Liver cyst Electronically Signed   By: Donavan Foil M.D.   On: 03/01/2019 19:28    Procedures Procedures (including critical care time)  Medications Ordered in ED Medications  ondansetron (ZOFRAN-ODT) disintegrating tablet 4 mg (4 mg Oral Given 03/01/19 1813)  alum & mag hydroxide-simeth  (MAALOX/MYLANTA)  200-200-20 MG/5ML suspension 30 mL (30 mLs Oral Given 03/01/19 1814)  promethazine (PHENERGAN) tablet 25 mg (25 mg Oral Given 03/01/19 2002)     Initial Impression / Assessment and Plan / ED Course  I have reviewed the triage vital signs and the nursing notes.  Pertinent labs & imaging results that were available during my care of the patient were reviewed by me and considered in my medical decision making (see chart for details).        BP (!) 144/100 (BP Location: Left Arm)   Pulse 91   Temp 97.6 F (36.4 C) (Oral)   Resp 20   Ht 5\' 3"  (1.6 m)   Wt 86.2 kg   SpO2 96%   BMI 33.66 kg/m    Final Clinical Impressions(s) / ED Diagnoses   Final diagnoses:  Non-intractable vomiting with nausea, unspecified vomiting type    ED Discharge Orders         Ordered    ondansetron (ZOFRAN) 4 MG tablet  Every 8 hours PRN     03/01/19 2132         5:39 PM Patient endorsed of abdominal discomfort along with nausea and vomiting after taking her calcium pills on an empty stomach.  She is a dialysis patient who has not missed any dialysis session.  On exam, she does have mild epigastric tenderness but otherwise a fairly benign abdominal exam.  Vital signs stable.  Will provide symptomatic treatment, obtain basic labs and monitor closely.  Low suspicion for SBO.  Doubt COVID.  9:36 PM Elevated WBC of 15.1.  Hemoglobin 16.1.  I suspect this is likely hemoconcentrated.  Renal function is abnormal but at baseline.  Normal potassium level.  Mild elevated lipase of 56.  Abdominal ultrasound showed no evidence of gallstones.  Nonspecific focal wall thickening up to 6 mm without other sonographic features to suggest acute cholecystitis.  Patient received medication on reexamination she felt much better and no significant abdominal discomfort on reexamination.  At this time, I recommend patient to follow-up with PCP for further care.  Patient understand to return if her condition  worsen and she may benefit from reexamination to rule out cholecystitis.   Domenic Moras, PA-C 03/01/19 2137    Lennice Sites, DO 03/05/19 1643

## 2019-03-02 DIAGNOSIS — N2581 Secondary hyperparathyroidism of renal origin: Secondary | ICD-10-CM | POA: Diagnosis not present

## 2019-03-02 DIAGNOSIS — D509 Iron deficiency anemia, unspecified: Secondary | ICD-10-CM | POA: Diagnosis not present

## 2019-03-02 DIAGNOSIS — N186 End stage renal disease: Secondary | ICD-10-CM | POA: Diagnosis not present

## 2019-03-05 DIAGNOSIS — N186 End stage renal disease: Secondary | ICD-10-CM | POA: Diagnosis not present

## 2019-03-05 DIAGNOSIS — D509 Iron deficiency anemia, unspecified: Secondary | ICD-10-CM | POA: Diagnosis not present

## 2019-03-05 DIAGNOSIS — N2581 Secondary hyperparathyroidism of renal origin: Secondary | ICD-10-CM | POA: Diagnosis not present

## 2019-03-07 DIAGNOSIS — N2581 Secondary hyperparathyroidism of renal origin: Secondary | ICD-10-CM | POA: Diagnosis not present

## 2019-03-07 DIAGNOSIS — N186 End stage renal disease: Secondary | ICD-10-CM | POA: Diagnosis not present

## 2019-03-07 DIAGNOSIS — D509 Iron deficiency anemia, unspecified: Secondary | ICD-10-CM | POA: Diagnosis not present

## 2019-03-09 DIAGNOSIS — N186 End stage renal disease: Secondary | ICD-10-CM | POA: Diagnosis not present

## 2019-03-09 DIAGNOSIS — D509 Iron deficiency anemia, unspecified: Secondary | ICD-10-CM | POA: Diagnosis not present

## 2019-03-09 DIAGNOSIS — N2581 Secondary hyperparathyroidism of renal origin: Secondary | ICD-10-CM | POA: Diagnosis not present

## 2019-03-12 DIAGNOSIS — N2581 Secondary hyperparathyroidism of renal origin: Secondary | ICD-10-CM | POA: Diagnosis not present

## 2019-03-12 DIAGNOSIS — D509 Iron deficiency anemia, unspecified: Secondary | ICD-10-CM | POA: Diagnosis not present

## 2019-03-12 DIAGNOSIS — N186 End stage renal disease: Secondary | ICD-10-CM | POA: Diagnosis not present

## 2019-03-14 DIAGNOSIS — D509 Iron deficiency anemia, unspecified: Secondary | ICD-10-CM | POA: Diagnosis not present

## 2019-03-14 DIAGNOSIS — N2581 Secondary hyperparathyroidism of renal origin: Secondary | ICD-10-CM | POA: Diagnosis not present

## 2019-03-14 DIAGNOSIS — N186 End stage renal disease: Secondary | ICD-10-CM | POA: Diagnosis not present

## 2019-03-16 DIAGNOSIS — D509 Iron deficiency anemia, unspecified: Secondary | ICD-10-CM | POA: Diagnosis not present

## 2019-03-16 DIAGNOSIS — N2581 Secondary hyperparathyroidism of renal origin: Secondary | ICD-10-CM | POA: Diagnosis not present

## 2019-03-16 DIAGNOSIS — N186 End stage renal disease: Secondary | ICD-10-CM | POA: Diagnosis not present

## 2019-03-19 DIAGNOSIS — D509 Iron deficiency anemia, unspecified: Secondary | ICD-10-CM | POA: Diagnosis not present

## 2019-03-19 DIAGNOSIS — N186 End stage renal disease: Secondary | ICD-10-CM | POA: Diagnosis not present

## 2019-03-19 DIAGNOSIS — N2581 Secondary hyperparathyroidism of renal origin: Secondary | ICD-10-CM | POA: Diagnosis not present

## 2019-03-21 DIAGNOSIS — I120 Hypertensive chronic kidney disease with stage 5 chronic kidney disease or end stage renal disease: Secondary | ICD-10-CM | POA: Diagnosis not present

## 2019-03-21 DIAGNOSIS — Z992 Dependence on renal dialysis: Secondary | ICD-10-CM | POA: Diagnosis not present

## 2019-03-21 DIAGNOSIS — N186 End stage renal disease: Secondary | ICD-10-CM | POA: Diagnosis not present

## 2019-03-21 DIAGNOSIS — N2581 Secondary hyperparathyroidism of renal origin: Secondary | ICD-10-CM | POA: Diagnosis not present

## 2019-03-21 DIAGNOSIS — D509 Iron deficiency anemia, unspecified: Secondary | ICD-10-CM | POA: Diagnosis not present

## 2019-03-23 DIAGNOSIS — N2581 Secondary hyperparathyroidism of renal origin: Secondary | ICD-10-CM | POA: Diagnosis not present

## 2019-03-23 DIAGNOSIS — N186 End stage renal disease: Secondary | ICD-10-CM | POA: Diagnosis not present

## 2019-03-23 DIAGNOSIS — D509 Iron deficiency anemia, unspecified: Secondary | ICD-10-CM | POA: Diagnosis not present

## 2019-03-26 DIAGNOSIS — D509 Iron deficiency anemia, unspecified: Secondary | ICD-10-CM | POA: Diagnosis not present

## 2019-03-26 DIAGNOSIS — N2581 Secondary hyperparathyroidism of renal origin: Secondary | ICD-10-CM | POA: Diagnosis not present

## 2019-03-26 DIAGNOSIS — N186 End stage renal disease: Secondary | ICD-10-CM | POA: Diagnosis not present

## 2019-03-28 DIAGNOSIS — D509 Iron deficiency anemia, unspecified: Secondary | ICD-10-CM | POA: Diagnosis not present

## 2019-03-28 DIAGNOSIS — N2581 Secondary hyperparathyroidism of renal origin: Secondary | ICD-10-CM | POA: Diagnosis not present

## 2019-03-28 DIAGNOSIS — N186 End stage renal disease: Secondary | ICD-10-CM | POA: Diagnosis not present

## 2019-03-30 DIAGNOSIS — D509 Iron deficiency anemia, unspecified: Secondary | ICD-10-CM | POA: Diagnosis not present

## 2019-03-30 DIAGNOSIS — N186 End stage renal disease: Secondary | ICD-10-CM | POA: Diagnosis not present

## 2019-03-30 DIAGNOSIS — N2581 Secondary hyperparathyroidism of renal origin: Secondary | ICD-10-CM | POA: Diagnosis not present

## 2019-04-02 DIAGNOSIS — N2581 Secondary hyperparathyroidism of renal origin: Secondary | ICD-10-CM | POA: Diagnosis not present

## 2019-04-02 DIAGNOSIS — N186 End stage renal disease: Secondary | ICD-10-CM | POA: Diagnosis not present

## 2019-04-02 DIAGNOSIS — D509 Iron deficiency anemia, unspecified: Secondary | ICD-10-CM | POA: Diagnosis not present

## 2019-04-03 ENCOUNTER — Other Ambulatory Visit: Payer: Self-pay | Admitting: Nephrology

## 2019-04-03 DIAGNOSIS — Z1231 Encounter for screening mammogram for malignant neoplasm of breast: Secondary | ICD-10-CM

## 2019-04-04 DIAGNOSIS — N186 End stage renal disease: Secondary | ICD-10-CM | POA: Diagnosis not present

## 2019-04-04 DIAGNOSIS — D509 Iron deficiency anemia, unspecified: Secondary | ICD-10-CM | POA: Diagnosis not present

## 2019-04-04 DIAGNOSIS — N2581 Secondary hyperparathyroidism of renal origin: Secondary | ICD-10-CM | POA: Diagnosis not present

## 2019-04-06 DIAGNOSIS — D509 Iron deficiency anemia, unspecified: Secondary | ICD-10-CM | POA: Diagnosis not present

## 2019-04-06 DIAGNOSIS — N186 End stage renal disease: Secondary | ICD-10-CM | POA: Diagnosis not present

## 2019-04-06 DIAGNOSIS — N2581 Secondary hyperparathyroidism of renal origin: Secondary | ICD-10-CM | POA: Diagnosis not present

## 2019-04-09 DIAGNOSIS — N2581 Secondary hyperparathyroidism of renal origin: Secondary | ICD-10-CM | POA: Diagnosis not present

## 2019-04-09 DIAGNOSIS — N186 End stage renal disease: Secondary | ICD-10-CM | POA: Diagnosis not present

## 2019-04-09 DIAGNOSIS — D509 Iron deficiency anemia, unspecified: Secondary | ICD-10-CM | POA: Diagnosis not present

## 2019-04-11 DIAGNOSIS — D509 Iron deficiency anemia, unspecified: Secondary | ICD-10-CM | POA: Diagnosis not present

## 2019-04-11 DIAGNOSIS — N2581 Secondary hyperparathyroidism of renal origin: Secondary | ICD-10-CM | POA: Diagnosis not present

## 2019-04-11 DIAGNOSIS — N186 End stage renal disease: Secondary | ICD-10-CM | POA: Diagnosis not present

## 2019-04-13 DIAGNOSIS — N2581 Secondary hyperparathyroidism of renal origin: Secondary | ICD-10-CM | POA: Diagnosis not present

## 2019-04-13 DIAGNOSIS — D509 Iron deficiency anemia, unspecified: Secondary | ICD-10-CM | POA: Diagnosis not present

## 2019-04-13 DIAGNOSIS — N186 End stage renal disease: Secondary | ICD-10-CM | POA: Diagnosis not present

## 2019-04-16 DIAGNOSIS — D509 Iron deficiency anemia, unspecified: Secondary | ICD-10-CM | POA: Diagnosis not present

## 2019-04-16 DIAGNOSIS — N186 End stage renal disease: Secondary | ICD-10-CM | POA: Diagnosis not present

## 2019-04-16 DIAGNOSIS — N2581 Secondary hyperparathyroidism of renal origin: Secondary | ICD-10-CM | POA: Diagnosis not present

## 2019-04-18 DIAGNOSIS — N2581 Secondary hyperparathyroidism of renal origin: Secondary | ICD-10-CM | POA: Diagnosis not present

## 2019-04-18 DIAGNOSIS — D509 Iron deficiency anemia, unspecified: Secondary | ICD-10-CM | POA: Diagnosis not present

## 2019-04-18 DIAGNOSIS — N186 End stage renal disease: Secondary | ICD-10-CM | POA: Diagnosis not present

## 2019-04-20 DIAGNOSIS — D509 Iron deficiency anemia, unspecified: Secondary | ICD-10-CM | POA: Diagnosis not present

## 2019-04-20 DIAGNOSIS — N2581 Secondary hyperparathyroidism of renal origin: Secondary | ICD-10-CM | POA: Diagnosis not present

## 2019-04-20 DIAGNOSIS — N186 End stage renal disease: Secondary | ICD-10-CM | POA: Diagnosis not present

## 2019-04-21 DIAGNOSIS — I120 Hypertensive chronic kidney disease with stage 5 chronic kidney disease or end stage renal disease: Secondary | ICD-10-CM | POA: Diagnosis not present

## 2019-04-21 DIAGNOSIS — N186 End stage renal disease: Secondary | ICD-10-CM | POA: Diagnosis not present

## 2019-04-21 DIAGNOSIS — Z992 Dependence on renal dialysis: Secondary | ICD-10-CM | POA: Diagnosis not present

## 2019-04-23 DIAGNOSIS — N2581 Secondary hyperparathyroidism of renal origin: Secondary | ICD-10-CM | POA: Diagnosis not present

## 2019-04-23 DIAGNOSIS — Z992 Dependence on renal dialysis: Secondary | ICD-10-CM | POA: Diagnosis not present

## 2019-04-23 DIAGNOSIS — N186 End stage renal disease: Secondary | ICD-10-CM | POA: Diagnosis not present

## 2019-04-23 DIAGNOSIS — D509 Iron deficiency anemia, unspecified: Secondary | ICD-10-CM | POA: Diagnosis not present

## 2019-04-25 DIAGNOSIS — N2581 Secondary hyperparathyroidism of renal origin: Secondary | ICD-10-CM | POA: Diagnosis not present

## 2019-04-25 DIAGNOSIS — D509 Iron deficiency anemia, unspecified: Secondary | ICD-10-CM | POA: Diagnosis not present

## 2019-04-25 DIAGNOSIS — Z992 Dependence on renal dialysis: Secondary | ICD-10-CM | POA: Diagnosis not present

## 2019-04-25 DIAGNOSIS — N186 End stage renal disease: Secondary | ICD-10-CM | POA: Diagnosis not present

## 2019-04-27 DIAGNOSIS — Z992 Dependence on renal dialysis: Secondary | ICD-10-CM | POA: Diagnosis not present

## 2019-04-27 DIAGNOSIS — D509 Iron deficiency anemia, unspecified: Secondary | ICD-10-CM | POA: Diagnosis not present

## 2019-04-27 DIAGNOSIS — N186 End stage renal disease: Secondary | ICD-10-CM | POA: Diagnosis not present

## 2019-04-27 DIAGNOSIS — N2581 Secondary hyperparathyroidism of renal origin: Secondary | ICD-10-CM | POA: Diagnosis not present

## 2019-04-30 DIAGNOSIS — D509 Iron deficiency anemia, unspecified: Secondary | ICD-10-CM | POA: Diagnosis not present

## 2019-04-30 DIAGNOSIS — N2581 Secondary hyperparathyroidism of renal origin: Secondary | ICD-10-CM | POA: Diagnosis not present

## 2019-04-30 DIAGNOSIS — Z992 Dependence on renal dialysis: Secondary | ICD-10-CM | POA: Diagnosis not present

## 2019-04-30 DIAGNOSIS — N186 End stage renal disease: Secondary | ICD-10-CM | POA: Diagnosis not present

## 2019-05-02 DIAGNOSIS — D509 Iron deficiency anemia, unspecified: Secondary | ICD-10-CM | POA: Diagnosis not present

## 2019-05-02 DIAGNOSIS — N2581 Secondary hyperparathyroidism of renal origin: Secondary | ICD-10-CM | POA: Diagnosis not present

## 2019-05-02 DIAGNOSIS — N186 End stage renal disease: Secondary | ICD-10-CM | POA: Diagnosis not present

## 2019-05-02 DIAGNOSIS — Z992 Dependence on renal dialysis: Secondary | ICD-10-CM | POA: Diagnosis not present

## 2019-05-04 DIAGNOSIS — D509 Iron deficiency anemia, unspecified: Secondary | ICD-10-CM | POA: Diagnosis not present

## 2019-05-04 DIAGNOSIS — Z992 Dependence on renal dialysis: Secondary | ICD-10-CM | POA: Diagnosis not present

## 2019-05-04 DIAGNOSIS — N2581 Secondary hyperparathyroidism of renal origin: Secondary | ICD-10-CM | POA: Diagnosis not present

## 2019-05-04 DIAGNOSIS — N186 End stage renal disease: Secondary | ICD-10-CM | POA: Diagnosis not present

## 2019-05-07 DIAGNOSIS — N2581 Secondary hyperparathyroidism of renal origin: Secondary | ICD-10-CM | POA: Diagnosis not present

## 2019-05-07 DIAGNOSIS — D509 Iron deficiency anemia, unspecified: Secondary | ICD-10-CM | POA: Diagnosis not present

## 2019-05-07 DIAGNOSIS — N186 End stage renal disease: Secondary | ICD-10-CM | POA: Diagnosis not present

## 2019-05-07 DIAGNOSIS — Z992 Dependence on renal dialysis: Secondary | ICD-10-CM | POA: Diagnosis not present

## 2019-05-09 DIAGNOSIS — Z992 Dependence on renal dialysis: Secondary | ICD-10-CM | POA: Diagnosis not present

## 2019-05-09 DIAGNOSIS — D509 Iron deficiency anemia, unspecified: Secondary | ICD-10-CM | POA: Diagnosis not present

## 2019-05-09 DIAGNOSIS — N2581 Secondary hyperparathyroidism of renal origin: Secondary | ICD-10-CM | POA: Diagnosis not present

## 2019-05-09 DIAGNOSIS — M65321 Trigger finger, right index finger: Secondary | ICD-10-CM | POA: Diagnosis not present

## 2019-05-09 DIAGNOSIS — N186 End stage renal disease: Secondary | ICD-10-CM | POA: Diagnosis not present

## 2019-05-09 DIAGNOSIS — M65342 Trigger finger, left ring finger: Secondary | ICD-10-CM | POA: Diagnosis not present

## 2019-05-11 DIAGNOSIS — N186 End stage renal disease: Secondary | ICD-10-CM | POA: Diagnosis not present

## 2019-05-11 DIAGNOSIS — Z992 Dependence on renal dialysis: Secondary | ICD-10-CM | POA: Diagnosis not present

## 2019-05-11 DIAGNOSIS — N2581 Secondary hyperparathyroidism of renal origin: Secondary | ICD-10-CM | POA: Diagnosis not present

## 2019-05-11 DIAGNOSIS — D509 Iron deficiency anemia, unspecified: Secondary | ICD-10-CM | POA: Diagnosis not present

## 2019-05-14 DIAGNOSIS — N2581 Secondary hyperparathyroidism of renal origin: Secondary | ICD-10-CM | POA: Diagnosis not present

## 2019-05-14 DIAGNOSIS — D509 Iron deficiency anemia, unspecified: Secondary | ICD-10-CM | POA: Diagnosis not present

## 2019-05-14 DIAGNOSIS — Z992 Dependence on renal dialysis: Secondary | ICD-10-CM | POA: Diagnosis not present

## 2019-05-14 DIAGNOSIS — N186 End stage renal disease: Secondary | ICD-10-CM | POA: Diagnosis not present

## 2019-05-16 DIAGNOSIS — D509 Iron deficiency anemia, unspecified: Secondary | ICD-10-CM | POA: Diagnosis not present

## 2019-05-16 DIAGNOSIS — N2581 Secondary hyperparathyroidism of renal origin: Secondary | ICD-10-CM | POA: Diagnosis not present

## 2019-05-16 DIAGNOSIS — N186 End stage renal disease: Secondary | ICD-10-CM | POA: Diagnosis not present

## 2019-05-16 DIAGNOSIS — Z992 Dependence on renal dialysis: Secondary | ICD-10-CM | POA: Diagnosis not present

## 2019-05-17 ENCOUNTER — Ambulatory Visit
Admission: RE | Admit: 2019-05-17 | Discharge: 2019-05-17 | Disposition: A | Discharge status: Home / Self Care | Payer: Medicare Other | Source: Ambulatory Visit | Attending: Nephrology | Admitting: Nephrology

## 2019-05-17 ENCOUNTER — Other Ambulatory Visit: Payer: Self-pay

## 2019-05-17 DIAGNOSIS — Z1231 Encounter for screening mammogram for malignant neoplasm of breast: Secondary | ICD-10-CM | POA: Diagnosis not present

## 2019-05-18 DIAGNOSIS — Z992 Dependence on renal dialysis: Secondary | ICD-10-CM | POA: Diagnosis not present

## 2019-05-18 DIAGNOSIS — D509 Iron deficiency anemia, unspecified: Secondary | ICD-10-CM | POA: Diagnosis not present

## 2019-05-18 DIAGNOSIS — N2581 Secondary hyperparathyroidism of renal origin: Secondary | ICD-10-CM | POA: Diagnosis not present

## 2019-05-18 DIAGNOSIS — N186 End stage renal disease: Secondary | ICD-10-CM | POA: Diagnosis not present

## 2019-05-21 DIAGNOSIS — N2581 Secondary hyperparathyroidism of renal origin: Secondary | ICD-10-CM | POA: Diagnosis not present

## 2019-05-21 DIAGNOSIS — D509 Iron deficiency anemia, unspecified: Secondary | ICD-10-CM | POA: Diagnosis not present

## 2019-05-21 DIAGNOSIS — N186 End stage renal disease: Secondary | ICD-10-CM | POA: Diagnosis not present

## 2019-05-21 DIAGNOSIS — Z992 Dependence on renal dialysis: Secondary | ICD-10-CM | POA: Diagnosis not present

## 2019-05-22 DIAGNOSIS — N186 End stage renal disease: Secondary | ICD-10-CM | POA: Diagnosis not present

## 2019-05-22 DIAGNOSIS — I120 Hypertensive chronic kidney disease with stage 5 chronic kidney disease or end stage renal disease: Secondary | ICD-10-CM | POA: Diagnosis not present

## 2019-05-22 DIAGNOSIS — Z992 Dependence on renal dialysis: Secondary | ICD-10-CM | POA: Diagnosis not present

## 2019-05-23 DIAGNOSIS — Z992 Dependence on renal dialysis: Secondary | ICD-10-CM | POA: Diagnosis not present

## 2019-05-23 DIAGNOSIS — N2581 Secondary hyperparathyroidism of renal origin: Secondary | ICD-10-CM | POA: Diagnosis not present

## 2019-05-23 DIAGNOSIS — N186 End stage renal disease: Secondary | ICD-10-CM | POA: Diagnosis not present

## 2019-05-23 DIAGNOSIS — D509 Iron deficiency anemia, unspecified: Secondary | ICD-10-CM | POA: Diagnosis not present

## 2019-05-25 DIAGNOSIS — N2581 Secondary hyperparathyroidism of renal origin: Secondary | ICD-10-CM | POA: Diagnosis not present

## 2019-05-25 DIAGNOSIS — Z992 Dependence on renal dialysis: Secondary | ICD-10-CM | POA: Diagnosis not present

## 2019-05-25 DIAGNOSIS — N186 End stage renal disease: Secondary | ICD-10-CM | POA: Diagnosis not present

## 2019-05-25 DIAGNOSIS — D509 Iron deficiency anemia, unspecified: Secondary | ICD-10-CM | POA: Diagnosis not present

## 2019-05-29 DIAGNOSIS — Z992 Dependence on renal dialysis: Secondary | ICD-10-CM | POA: Diagnosis not present

## 2019-05-29 DIAGNOSIS — N186 End stage renal disease: Secondary | ICD-10-CM | POA: Diagnosis not present

## 2019-05-29 DIAGNOSIS — N2581 Secondary hyperparathyroidism of renal origin: Secondary | ICD-10-CM | POA: Diagnosis not present

## 2019-05-29 DIAGNOSIS — D509 Iron deficiency anemia, unspecified: Secondary | ICD-10-CM | POA: Diagnosis not present

## 2019-05-30 DIAGNOSIS — D509 Iron deficiency anemia, unspecified: Secondary | ICD-10-CM | POA: Diagnosis not present

## 2019-05-30 DIAGNOSIS — N2581 Secondary hyperparathyroidism of renal origin: Secondary | ICD-10-CM | POA: Diagnosis not present

## 2019-05-30 DIAGNOSIS — Z992 Dependence on renal dialysis: Secondary | ICD-10-CM | POA: Diagnosis not present

## 2019-05-30 DIAGNOSIS — N186 End stage renal disease: Secondary | ICD-10-CM | POA: Diagnosis not present

## 2019-06-01 DIAGNOSIS — N2581 Secondary hyperparathyroidism of renal origin: Secondary | ICD-10-CM | POA: Diagnosis not present

## 2019-06-01 DIAGNOSIS — Z992 Dependence on renal dialysis: Secondary | ICD-10-CM | POA: Diagnosis not present

## 2019-06-01 DIAGNOSIS — D509 Iron deficiency anemia, unspecified: Secondary | ICD-10-CM | POA: Diagnosis not present

## 2019-06-01 DIAGNOSIS — N186 End stage renal disease: Secondary | ICD-10-CM | POA: Diagnosis not present

## 2019-06-04 DIAGNOSIS — Z992 Dependence on renal dialysis: Secondary | ICD-10-CM | POA: Diagnosis not present

## 2019-06-04 DIAGNOSIS — D509 Iron deficiency anemia, unspecified: Secondary | ICD-10-CM | POA: Diagnosis not present

## 2019-06-04 DIAGNOSIS — N2581 Secondary hyperparathyroidism of renal origin: Secondary | ICD-10-CM | POA: Diagnosis not present

## 2019-06-04 DIAGNOSIS — N186 End stage renal disease: Secondary | ICD-10-CM | POA: Diagnosis not present

## 2019-06-06 DIAGNOSIS — N186 End stage renal disease: Secondary | ICD-10-CM | POA: Diagnosis not present

## 2019-06-06 DIAGNOSIS — Z992 Dependence on renal dialysis: Secondary | ICD-10-CM | POA: Diagnosis not present

## 2019-06-06 DIAGNOSIS — N2581 Secondary hyperparathyroidism of renal origin: Secondary | ICD-10-CM | POA: Diagnosis not present

## 2019-06-06 DIAGNOSIS — D509 Iron deficiency anemia, unspecified: Secondary | ICD-10-CM | POA: Diagnosis not present

## 2019-06-08 DIAGNOSIS — N186 End stage renal disease: Secondary | ICD-10-CM | POA: Diagnosis not present

## 2019-06-08 DIAGNOSIS — D509 Iron deficiency anemia, unspecified: Secondary | ICD-10-CM | POA: Diagnosis not present

## 2019-06-08 DIAGNOSIS — N2581 Secondary hyperparathyroidism of renal origin: Secondary | ICD-10-CM | POA: Diagnosis not present

## 2019-06-08 DIAGNOSIS — Z992 Dependence on renal dialysis: Secondary | ICD-10-CM | POA: Diagnosis not present

## 2019-06-11 ENCOUNTER — Other Ambulatory Visit: Payer: Self-pay

## 2019-06-11 ENCOUNTER — Encounter (HOSPITAL_COMMUNITY): Payer: Self-pay

## 2019-06-11 ENCOUNTER — Ambulatory Visit (HOSPITAL_COMMUNITY)
Admission: EM | Admit: 2019-06-11 | Discharge: 2019-06-11 | Disposition: A | Discharge status: Home / Self Care | Payer: Medicare Other | Attending: Emergency Medicine | Admitting: Emergency Medicine

## 2019-06-11 DIAGNOSIS — Z79899 Other long term (current) drug therapy: Secondary | ICD-10-CM | POA: Diagnosis not present

## 2019-06-11 DIAGNOSIS — E785 Hyperlipidemia, unspecified: Secondary | ICD-10-CM | POA: Insufficient documentation

## 2019-06-11 DIAGNOSIS — Z20828 Contact with and (suspected) exposure to other viral communicable diseases: Secondary | ICD-10-CM | POA: Insufficient documentation

## 2019-06-11 DIAGNOSIS — Z992 Dependence on renal dialysis: Secondary | ICD-10-CM | POA: Insufficient documentation

## 2019-06-11 DIAGNOSIS — N186 End stage renal disease: Secondary | ICD-10-CM | POA: Insufficient documentation

## 2019-06-11 DIAGNOSIS — E041 Nontoxic single thyroid nodule: Secondary | ICD-10-CM | POA: Diagnosis not present

## 2019-06-11 DIAGNOSIS — R05 Cough: Secondary | ICD-10-CM | POA: Insufficient documentation

## 2019-06-11 DIAGNOSIS — R059 Cough, unspecified: Secondary | ICD-10-CM

## 2019-06-11 DIAGNOSIS — I120 Hypertensive chronic kidney disease with stage 5 chronic kidney disease or end stage renal disease: Secondary | ICD-10-CM | POA: Diagnosis not present

## 2019-06-11 DIAGNOSIS — N2581 Secondary hyperparathyroidism of renal origin: Secondary | ICD-10-CM | POA: Insufficient documentation

## 2019-06-11 DIAGNOSIS — D509 Iron deficiency anemia, unspecified: Secondary | ICD-10-CM | POA: Diagnosis not present

## 2019-06-11 DIAGNOSIS — Z20822 Contact with and (suspected) exposure to covid-19: Secondary | ICD-10-CM

## 2019-06-11 DIAGNOSIS — J029 Acute pharyngitis, unspecified: Secondary | ICD-10-CM | POA: Insufficient documentation

## 2019-06-11 LAB — POCT RAPID STREP A: Streptococcus, Group A Screen (Direct): NEGATIVE

## 2019-06-11 MED ORDER — METHYLPREDNISOLONE SODIUM SUCC 125 MG IJ SOLR
80.0000 mg | Freq: Once | INTRAMUSCULAR | Status: AC
  Administered 2019-06-11: 12:00:00 80 mg via INTRAMUSCULAR

## 2019-06-11 MED ORDER — METHYLPREDNISOLONE SODIUM SUCC 125 MG IJ SOLR
INTRAMUSCULAR | Status: AC
  Filled 2019-06-11: qty 2

## 2019-06-11 NOTE — Discharge Instructions (Addendum)
You were given an injection of a steroid called Solu-Medrol.    Rapid strep test was negative.  We will send a throat culture and call you if it is positive.    Your COVID test is pending.  You should self quarantine until your test result is back and is negative.    Go to the emergency department if you develop high fever, shortness of breath, severe diarrhea, or other concerning symptoms.

## 2019-06-11 NOTE — ED Provider Notes (Signed)
[place]    CSN: [id number] Arrival date & time: 06/11/19  [id number]      History   Chief Complaint Chief Complaint  Patient presents with  . Nasal Drainage  . Scratchy Throat  . Allergies    HPI Victoria [name] is a 63 y.o. female.   Patient presents with scratchy throat, nasal drainage, congestion, cough x2 days.  She feels her symptoms are due to the change in weather and her allergies.  She requests COVID testing.  She denies fever, chills, difficulty swallowing, shortness of breath, vomiting, diarrhea, or other symptoms.  Medical history is significant for end-stage renal disease, dialysis, hypertension.  Patient requests steroid "shot" that she previously received for similar symptoms which helped.  The history is provided by the patient.    Past Medical History:  Diagnosis Date  . Chronic bronchitis ([place])   . Complication of anesthesia ~ 2011   "they gave me a medicine that swolled me and mouth burning up" (08/23/2012)  . Complication of anesthesia    slow to wake up  . ESRD (end stage renal disease) on dialysis [facility]) Nephrologist-- dr deterding   ESRD due to HTN-; "M/W/F; [place]" (11/28/2015  . GERD (gastroesophageal reflux disease)   . History of acute pulmonary edema    2003  . Hypertension    no longer on medications  . Left patella fracture   . Pneumonia    as a child  . Seasonal allergies   . Secondary hyperparathyroidism, renal ([place])    s/p  total parathyroidectomy  2014  . Thyroid disease   . Wears glasses     Patient Active Problem List   Diagnosis Date Noted  . Dehiscence of operative wound 11/28/2015  . Wound dehiscence, surgical 11/28/2015  . Renal dialysis device, implant, or graft complication [id number]  . Aftercare following surgery of the circulatory system, [place] 03/30/2013  . Mechanical complication of other vascular device, implant, and graft 01/12/2013  . Other and unspecified hyperlipidemia 10/25/2012  . End stage renal  disease ([place]) 09/22/2012  . Chest pain, mid sternal 08/23/2012  . Hyperparathyroidism, secondary renal ([place]) 03/22/2011  . Thyroid nodule 03/22/2011  . High blood pressure 03/10/2011  . Kidney disease 03/10/2011    Past Surgical History:  Procedure Laterality Date  . ANKLE FRACTURE SURGERY Right ~ 2005   "has pins in it"  . APPLICATION OF WOUND VAC Left 11/28/2015   knee  . APPLICATION OF WOUND VAC Left 11/28/2015   Procedure: APPLICATION OF WOUND VAC;  Surgeon: Rod Can, MD;  Location: [place];  Service: Orthopedics;  Laterality: Left;  . ARTERIOVENOUS GRAFT PLACEMENT  03-25-2005   Right forearm  w/  multiple Revision's   . CARDIOVASCULAR STRESS TEST  08-24-2012   abnormal nuclear study/  inferolateral and anteroseptal areas of scar,  no ischemia/  normal LV function and wall motion, ef 77%  . DIALYSIS FISTULA CREATION  1992   left upper arm ---  w/  Multiple Revision's until 2006  . ECTOPIC PREGNANCY SURGERY  1970's  . FRACTURE SURGERY    . I&D EXTREMITY Left 11/28/2015   Procedure: IRRIGATION AND DEBRIDEMENT LEFT KNEE WOUND ;  Surgeon: Rod Can, MD;  Location: [place];  Service: Orthopedics;  Laterality: Left;  . INCISION AND DRAINAGE OF WOUND Left 11/28/2015   knee  . ORIF PATELLA Left 10/31/2015   Procedure: OPEN REDUCTION INTERNAL (ORIF) FIXATION LEFT PATELLA;  Surgeon: Rod Can, MD;  Location: [facility];  Service: Orthopedics;  Laterality: Left;  . PATELLAR TENDON REPAIR  10/03/2012   Procedure: PATELLA TENDON REPAIR;  Surgeon: Marin Shutter, MD;  Location: [place];  Service: Orthopedics;  Laterality: Right;  . PATELLECTOMY  10/03/2012   Procedure: PATELLECTOMY;  Surgeon: Marin Shutter, MD;  Location: [place];  Service: Orthopedics;  Laterality: Right;  RIGHT PARTIAL PATELLECTOMY AND PATELLA TENDON REPAIR  . REVISION OF ARTERIOVENOUS GORETEX GRAFT  09/27/2012   Procedure: REVISION OF ARTERIOVENOUS GORETEX GRAFT;  Surgeon: Mal Misty, MD;  Location: [place];  Service: Vascular;  Laterality: Right;  1) Replacement of venous half of loop with 33mm Gortex graft  2) Excision of erroded pseudoaneurysm of graft with primary closure.  Marland Kitchen REVISION OF ARTERIOVENOUS GORETEX GRAFT Right 01/23/2013   Procedure: REVISION OF ARTERIOVENOUS GORETEX GRAFT;  Surgeon: Conrad [place], MD;  [facility] OR;  Service: Vascular;  Laterality: Right;  Using piece of 63mm x 20cm Gortex graft.   Marland Kitchen REVISION OF ARTERIOVENOUS GORETEX GRAFT Right 10/07/2015   Procedure: REVISION OF Right arm ARTERIOVENOUS GORETEX GRAFT;  Surgeon: Elam Dutch, MD;  Location: [facility] OR;  Service: Vascular;  Laterality: Right;  . REVISION OF ARTERIOVENOUS GORETEX GRAFT Right 02/17/2016   Procedure: REVISION OF ARTERIOVENOUS GORETEX GRAFT;  Surgeon: Elam Dutch, MD;  Location: [place];  Service: Vascular;  Laterality: Right;  . TOTAL ABDOMINAL HYSTERECTOMY  03-05-2005   w/  Right Ovarian Cystectomy  . TOTAL PARATHYROIDECTOMY/  THYROID ISTHMUSECTOMY/  AUTOTRANSPLANTATION PARATHYROID TISSUE TO LEFT BRACHIORIADIALIS MUSCLE  02-18-2011  . TRANSTHORACIC ECHOCARDIOGRAM  10-31-2012   mild LVH,  grade 1 diastolic dysfunction,  ef 55-65%/  mild MR/  trivial TR    OB History   No obstetric history on file.      Home Medications    Prior to Admission medications   Medication Sig Start Date End Date Taking? Authorizing Provider  calcium acetate (PHOSLO) 667 MG capsule Take 2,001 mg by mouth 3 (three) times daily with meals.     [provider]  ondansetron (ZOFRAN) 4 MG tablet Take 1 tablet (4 mg total) by mouth every 8 (eight) hours as needed for nausea or vomiting. 03/01/19   Domenic Moras, PA-C    Family History Family History  Problem Relation Age of Onset  . Healthy Mother   . Hypertension Father   . Kidney failure Father   . Hypertension Sister   . Thyroid disease Sister     Social History Social History   Tobacco Use  . Smoking status: Never Smoker  . Smokeless tobacco: Never  Used  Substance Use Topics  . Alcohol use: No    Alcohol/week: 0.0 standard drinks    Comment: quit 2007  . Drug use: No     Allergies   Amlodipine and Lisinopril   Review of Systems Review of Systems  Constitutional: Negative for chills and fever.  HENT: Positive for congestion, postnasal drip, rhinorrhea and sore throat. Negative for ear pain.   Eyes: Negative for pain and visual disturbance.  Respiratory: Positive for cough. Negative for shortness of breath.   Cardiovascular: Negative for chest pain and palpitations.  Gastrointestinal: Negative for abdominal pain, diarrhea, nausea and vomiting.  Genitourinary: Negative for dysuria and hematuria.  Musculoskeletal: Negative for arthralgias and back pain.  Skin: Negative for color change and rash.  Neurological: Negative for seizures and syncope.  All other systems reviewed and are negative.    Physical Exam Triage Vital Signs ED Triage Vitals  Enc Vitals Group     BP 06/11/19 1047 101/69     Pulse Rate 06/11/19 1047 85     Resp 06/11/19 1047 16     Temp 06/11/19 1047 97.8 F (36.6 C)     Temp Source 06/11/19 1047 Temporal     SpO2 06/11/19 1047 100 %     Weight --      Height --      Head Circumference --      Peak Flow --      Pain Score 06/11/19 1103 4     Pain Loc --      Pain Edu? --      Excl. in [place]? --    No data found.  Updated Vital Signs BP 101/69 (BP Location: Right Arm)   Pulse 85   Temp 97.8 F (36.6 C) (Temporal)   Resp 16   SpO2 100%   Visual Acuity Right Eye Distance:   Left Eye Distance:   Bilateral Distance:    Right Eye Near:   Left Eye Near:    Bilateral Near:     Physical Exam Vitals signs and nursing note reviewed.  Constitutional:      General: She is not in acute distress.    Appearance: She is well-developed.  HENT:     Head: Normocephalic and atraumatic.     Right Ear: Tympanic membrane normal.     Left Ear: Tympanic membrane normal.     Nose: Nose normal.      Mouth/Throat:     Mouth: Mucous membranes are moist.     Pharynx: Oropharynx is clear.  Eyes:     Conjunctiva/sclera: Conjunctivae normal.  Neck:     Musculoskeletal: Neck supple.  Cardiovascular:     Rate and Rhythm: Normal rate and regular rhythm.     Heart sounds: No murmur.  Pulmonary:     Effort: Pulmonary effort is normal. No respiratory distress.     Breath sounds: Normal breath sounds. No wheezing or rhonchi.  Abdominal:     General: Bowel sounds are normal.     Palpations: Abdomen is soft.     Tenderness: There is no abdominal tenderness. There is no guarding or rebound.  Skin:    General: Skin is warm and dry.     Findings: No rash.  Neurological:     Mental Status: She is alert and oriented to person, place, and time.  Psychiatric:        Mood and Affect: Mood normal.        Behavior: Behavior normal.      UC Treatments / Results  Labs (all labs ordered are listed, but only abnormal results are displayed) Labs Reviewed  NOVEL CORONAVIRUS, NAA (HOSP ORDER, SEND-OUT TO REF LAB; TAT 18-24 HRS)  CULTURE, GROUP A STREP [facility])  POCT RAPID STREP A    EKG   Radiology No results found.  Procedures Procedures (including critical care time)  Medications Ordered in UC Medications  methylPREDNISolone sodium succinate (SOLU-MEDROL) 125 mg/2 mL injection 80 mg (has no administration in time range)    Initial Impression / Assessment and Plan / UC Course  I have reviewed the triage vital signs and the nursing notes.  Pertinent labs & imaging results that were available during my care of the patient were reviewed by me and considered in my medical decision making (see chart for details).   Cough, sore throat, suspected COVID.  Rapid strep negative; culture pending.  Treating with injection  of Solu-Medrol.  COVID test performed here.  Instructed patient to self quarantine until her test results are back.  Instructed patient to go to the emergency department if she  develops high fever, shortness of breath, severe diarrhea, or other concerning symptoms.  Instructed patient to follow-up with her nephrologist to see what over-the-counter allergy medications he approves.  Patient agrees with plan of care.     Final Clinical Impressions(s) / UC Diagnoses   Final diagnoses:  Cough  Sore throat  Suspected Covid-19 Virus Infection     Discharge Instructions     Rapid strep test was negative.  We will send a throat culture and call you if it is positive.    Your COVID test is pending.  You should self quarantine until your test result is back and is negative.    Go to the emergency department if you develop high fever, shortness of breath, severe diarrhea, or other concerning symptoms.       ED Prescriptions    None     PDMP not reviewed this encounter.   Sharion Balloon, NP 06/11/19 1136

## 2019-06-11 NOTE — ED Triage Notes (Signed)
Pt presents with scratchy throat, nasal drainage, congestion, and productive cough related to her allergies & change in weather X 2 days.

## 2019-06-13 DIAGNOSIS — N186 End stage renal disease: Secondary | ICD-10-CM | POA: Diagnosis not present

## 2019-06-13 DIAGNOSIS — D509 Iron deficiency anemia, unspecified: Secondary | ICD-10-CM | POA: Diagnosis not present

## 2019-06-13 DIAGNOSIS — N2581 Secondary hyperparathyroidism of renal origin: Secondary | ICD-10-CM | POA: Diagnosis not present

## 2019-06-13 DIAGNOSIS — M65321 Trigger finger, right index finger: Secondary | ICD-10-CM | POA: Diagnosis not present

## 2019-06-13 DIAGNOSIS — Z992 Dependence on renal dialysis: Secondary | ICD-10-CM | POA: Diagnosis not present

## 2019-06-13 LAB — CULTURE, GROUP A STREP (THRC)

## 2019-06-13 LAB — NOVEL CORONAVIRUS, NAA (HOSP ORDER, SEND-OUT TO REF LAB; TAT 18-24 HRS): SARS-CoV-2, NAA: NOT DETECTED

## 2019-06-15 DIAGNOSIS — N2581 Secondary hyperparathyroidism of renal origin: Secondary | ICD-10-CM | POA: Diagnosis not present

## 2019-06-15 DIAGNOSIS — D509 Iron deficiency anemia, unspecified: Secondary | ICD-10-CM | POA: Diagnosis not present

## 2019-06-15 DIAGNOSIS — N186 End stage renal disease: Secondary | ICD-10-CM | POA: Diagnosis not present

## 2019-06-15 DIAGNOSIS — Z992 Dependence on renal dialysis: Secondary | ICD-10-CM | POA: Diagnosis not present

## 2019-06-18 DIAGNOSIS — N2581 Secondary hyperparathyroidism of renal origin: Secondary | ICD-10-CM | POA: Diagnosis not present

## 2019-06-18 DIAGNOSIS — D509 Iron deficiency anemia, unspecified: Secondary | ICD-10-CM | POA: Diagnosis not present

## 2019-06-18 DIAGNOSIS — N186 End stage renal disease: Secondary | ICD-10-CM | POA: Diagnosis not present

## 2019-06-18 DIAGNOSIS — Z992 Dependence on renal dialysis: Secondary | ICD-10-CM | POA: Diagnosis not present

## 2019-06-20 DIAGNOSIS — D509 Iron deficiency anemia, unspecified: Secondary | ICD-10-CM | POA: Diagnosis not present

## 2019-06-20 DIAGNOSIS — N2581 Secondary hyperparathyroidism of renal origin: Secondary | ICD-10-CM | POA: Diagnosis not present

## 2019-06-20 DIAGNOSIS — Z992 Dependence on renal dialysis: Secondary | ICD-10-CM | POA: Diagnosis not present

## 2019-06-20 DIAGNOSIS — N186 End stage renal disease: Secondary | ICD-10-CM | POA: Diagnosis not present

## 2019-06-21 DIAGNOSIS — I120 Hypertensive chronic kidney disease with stage 5 chronic kidney disease or end stage renal disease: Secondary | ICD-10-CM | POA: Diagnosis not present

## 2019-06-21 DIAGNOSIS — Z992 Dependence on renal dialysis: Secondary | ICD-10-CM | POA: Diagnosis not present

## 2019-06-21 DIAGNOSIS — N186 End stage renal disease: Secondary | ICD-10-CM | POA: Diagnosis not present

## 2019-06-22 DIAGNOSIS — Z992 Dependence on renal dialysis: Secondary | ICD-10-CM | POA: Diagnosis not present

## 2019-06-22 DIAGNOSIS — D509 Iron deficiency anemia, unspecified: Secondary | ICD-10-CM | POA: Diagnosis not present

## 2019-06-22 DIAGNOSIS — Z23 Encounter for immunization: Secondary | ICD-10-CM | POA: Diagnosis not present

## 2019-06-22 DIAGNOSIS — T7840XA Allergy, unspecified, initial encounter: Secondary | ICD-10-CM | POA: Diagnosis not present

## 2019-06-22 DIAGNOSIS — N186 End stage renal disease: Secondary | ICD-10-CM | POA: Diagnosis not present

## 2019-06-22 DIAGNOSIS — N2581 Secondary hyperparathyroidism of renal origin: Secondary | ICD-10-CM | POA: Diagnosis not present

## 2019-06-25 DIAGNOSIS — N2581 Secondary hyperparathyroidism of renal origin: Secondary | ICD-10-CM | POA: Diagnosis not present

## 2019-06-25 DIAGNOSIS — D509 Iron deficiency anemia, unspecified: Secondary | ICD-10-CM | POA: Diagnosis not present

## 2019-06-25 DIAGNOSIS — Z992 Dependence on renal dialysis: Secondary | ICD-10-CM | POA: Diagnosis not present

## 2019-06-25 DIAGNOSIS — T7840XA Allergy, unspecified, initial encounter: Secondary | ICD-10-CM | POA: Diagnosis not present

## 2019-06-25 DIAGNOSIS — Z23 Encounter for immunization: Secondary | ICD-10-CM | POA: Diagnosis not present

## 2019-06-25 DIAGNOSIS — N186 End stage renal disease: Secondary | ICD-10-CM | POA: Diagnosis not present

## 2019-06-27 DIAGNOSIS — N2581 Secondary hyperparathyroidism of renal origin: Secondary | ICD-10-CM | POA: Diagnosis not present

## 2019-06-27 DIAGNOSIS — Z992 Dependence on renal dialysis: Secondary | ICD-10-CM | POA: Diagnosis not present

## 2019-06-27 DIAGNOSIS — N186 End stage renal disease: Secondary | ICD-10-CM | POA: Diagnosis not present

## 2019-06-27 DIAGNOSIS — Z23 Encounter for immunization: Secondary | ICD-10-CM | POA: Diagnosis not present

## 2019-06-27 DIAGNOSIS — D509 Iron deficiency anemia, unspecified: Secondary | ICD-10-CM | POA: Diagnosis not present

## 2019-06-27 DIAGNOSIS — T7840XA Allergy, unspecified, initial encounter: Secondary | ICD-10-CM | POA: Diagnosis not present

## 2019-06-29 DIAGNOSIS — T7840XA Allergy, unspecified, initial encounter: Secondary | ICD-10-CM | POA: Diagnosis not present

## 2019-06-29 DIAGNOSIS — N186 End stage renal disease: Secondary | ICD-10-CM | POA: Diagnosis not present

## 2019-06-29 DIAGNOSIS — Z23 Encounter for immunization: Secondary | ICD-10-CM | POA: Diagnosis not present

## 2019-06-29 DIAGNOSIS — D509 Iron deficiency anemia, unspecified: Secondary | ICD-10-CM | POA: Diagnosis not present

## 2019-06-29 DIAGNOSIS — N2581 Secondary hyperparathyroidism of renal origin: Secondary | ICD-10-CM | POA: Diagnosis not present

## 2019-06-29 DIAGNOSIS — Z992 Dependence on renal dialysis: Secondary | ICD-10-CM | POA: Diagnosis not present

## 2019-07-02 DIAGNOSIS — N2581 Secondary hyperparathyroidism of renal origin: Secondary | ICD-10-CM | POA: Diagnosis not present

## 2019-07-02 DIAGNOSIS — Z992 Dependence on renal dialysis: Secondary | ICD-10-CM | POA: Diagnosis not present

## 2019-07-02 DIAGNOSIS — D509 Iron deficiency anemia, unspecified: Secondary | ICD-10-CM | POA: Diagnosis not present

## 2019-07-02 DIAGNOSIS — N186 End stage renal disease: Secondary | ICD-10-CM | POA: Diagnosis not present

## 2019-07-02 DIAGNOSIS — Z23 Encounter for immunization: Secondary | ICD-10-CM | POA: Diagnosis not present

## 2019-07-02 DIAGNOSIS — T7840XA Allergy, unspecified, initial encounter: Secondary | ICD-10-CM | POA: Diagnosis not present

## 2019-07-04 DIAGNOSIS — Z992 Dependence on renal dialysis: Secondary | ICD-10-CM | POA: Diagnosis not present

## 2019-07-04 DIAGNOSIS — N186 End stage renal disease: Secondary | ICD-10-CM | POA: Diagnosis not present

## 2019-07-04 DIAGNOSIS — N2581 Secondary hyperparathyroidism of renal origin: Secondary | ICD-10-CM | POA: Diagnosis not present

## 2019-07-04 DIAGNOSIS — Z23 Encounter for immunization: Secondary | ICD-10-CM | POA: Diagnosis not present

## 2019-07-04 DIAGNOSIS — D509 Iron deficiency anemia, unspecified: Secondary | ICD-10-CM | POA: Diagnosis not present

## 2019-07-04 DIAGNOSIS — T7840XA Allergy, unspecified, initial encounter: Secondary | ICD-10-CM | POA: Diagnosis not present

## 2019-07-06 DIAGNOSIS — D509 Iron deficiency anemia, unspecified: Secondary | ICD-10-CM | POA: Diagnosis not present

## 2019-07-06 DIAGNOSIS — T7840XA Allergy, unspecified, initial encounter: Secondary | ICD-10-CM | POA: Diagnosis not present

## 2019-07-06 DIAGNOSIS — N186 End stage renal disease: Secondary | ICD-10-CM | POA: Diagnosis not present

## 2019-07-06 DIAGNOSIS — Z992 Dependence on renal dialysis: Secondary | ICD-10-CM | POA: Diagnosis not present

## 2019-07-06 DIAGNOSIS — N2581 Secondary hyperparathyroidism of renal origin: Secondary | ICD-10-CM | POA: Diagnosis not present

## 2019-07-06 DIAGNOSIS — Z23 Encounter for immunization: Secondary | ICD-10-CM | POA: Diagnosis not present

## 2019-07-09 DIAGNOSIS — D509 Iron deficiency anemia, unspecified: Secondary | ICD-10-CM | POA: Diagnosis not present

## 2019-07-09 DIAGNOSIS — Z992 Dependence on renal dialysis: Secondary | ICD-10-CM | POA: Diagnosis not present

## 2019-07-09 DIAGNOSIS — T7840XA Allergy, unspecified, initial encounter: Secondary | ICD-10-CM | POA: Diagnosis not present

## 2019-07-09 DIAGNOSIS — Z23 Encounter for immunization: Secondary | ICD-10-CM | POA: Diagnosis not present

## 2019-07-09 DIAGNOSIS — N2581 Secondary hyperparathyroidism of renal origin: Secondary | ICD-10-CM | POA: Diagnosis not present

## 2019-07-09 DIAGNOSIS — N186 End stage renal disease: Secondary | ICD-10-CM | POA: Diagnosis not present

## 2019-07-11 DIAGNOSIS — Z23 Encounter for immunization: Secondary | ICD-10-CM | POA: Diagnosis not present

## 2019-07-11 DIAGNOSIS — N2581 Secondary hyperparathyroidism of renal origin: Secondary | ICD-10-CM | POA: Diagnosis not present

## 2019-07-11 DIAGNOSIS — T7840XA Allergy, unspecified, initial encounter: Secondary | ICD-10-CM | POA: Diagnosis not present

## 2019-07-11 DIAGNOSIS — D509 Iron deficiency anemia, unspecified: Secondary | ICD-10-CM | POA: Diagnosis not present

## 2019-07-11 DIAGNOSIS — N186 End stage renal disease: Secondary | ICD-10-CM | POA: Diagnosis not present

## 2019-07-11 DIAGNOSIS — Z992 Dependence on renal dialysis: Secondary | ICD-10-CM | POA: Diagnosis not present

## 2019-07-13 DIAGNOSIS — T7840XA Allergy, unspecified, initial encounter: Secondary | ICD-10-CM | POA: Diagnosis not present

## 2019-07-13 DIAGNOSIS — N186 End stage renal disease: Secondary | ICD-10-CM | POA: Diagnosis not present

## 2019-07-13 DIAGNOSIS — N2581 Secondary hyperparathyroidism of renal origin: Secondary | ICD-10-CM | POA: Diagnosis not present

## 2019-07-13 DIAGNOSIS — Z23 Encounter for immunization: Secondary | ICD-10-CM | POA: Diagnosis not present

## 2019-07-13 DIAGNOSIS — D509 Iron deficiency anemia, unspecified: Secondary | ICD-10-CM | POA: Diagnosis not present

## 2019-07-13 DIAGNOSIS — Z992 Dependence on renal dialysis: Secondary | ICD-10-CM | POA: Diagnosis not present

## 2019-07-16 DIAGNOSIS — Z23 Encounter for immunization: Secondary | ICD-10-CM | POA: Diagnosis not present

## 2019-07-16 DIAGNOSIS — Z992 Dependence on renal dialysis: Secondary | ICD-10-CM | POA: Diagnosis not present

## 2019-07-16 DIAGNOSIS — T7840XA Allergy, unspecified, initial encounter: Secondary | ICD-10-CM | POA: Diagnosis not present

## 2019-07-16 DIAGNOSIS — D509 Iron deficiency anemia, unspecified: Secondary | ICD-10-CM | POA: Diagnosis not present

## 2019-07-16 DIAGNOSIS — N186 End stage renal disease: Secondary | ICD-10-CM | POA: Diagnosis not present

## 2019-07-16 DIAGNOSIS — N2581 Secondary hyperparathyroidism of renal origin: Secondary | ICD-10-CM | POA: Diagnosis not present

## 2019-07-18 DIAGNOSIS — D509 Iron deficiency anemia, unspecified: Secondary | ICD-10-CM | POA: Diagnosis not present

## 2019-07-18 DIAGNOSIS — Z992 Dependence on renal dialysis: Secondary | ICD-10-CM | POA: Diagnosis not present

## 2019-07-18 DIAGNOSIS — N2581 Secondary hyperparathyroidism of renal origin: Secondary | ICD-10-CM | POA: Diagnosis not present

## 2019-07-18 DIAGNOSIS — N186 End stage renal disease: Secondary | ICD-10-CM | POA: Diagnosis not present

## 2019-07-18 DIAGNOSIS — T7840XA Allergy, unspecified, initial encounter: Secondary | ICD-10-CM | POA: Diagnosis not present

## 2019-07-18 DIAGNOSIS — Z23 Encounter for immunization: Secondary | ICD-10-CM | POA: Diagnosis not present

## 2019-07-20 DIAGNOSIS — Z992 Dependence on renal dialysis: Secondary | ICD-10-CM | POA: Diagnosis not present

## 2019-07-20 DIAGNOSIS — D509 Iron deficiency anemia, unspecified: Secondary | ICD-10-CM | POA: Diagnosis not present

## 2019-07-20 DIAGNOSIS — T7840XA Allergy, unspecified, initial encounter: Secondary | ICD-10-CM | POA: Diagnosis not present

## 2019-07-20 DIAGNOSIS — N186 End stage renal disease: Secondary | ICD-10-CM | POA: Diagnosis not present

## 2019-07-20 DIAGNOSIS — Z23 Encounter for immunization: Secondary | ICD-10-CM | POA: Diagnosis not present

## 2019-07-20 DIAGNOSIS — N2581 Secondary hyperparathyroidism of renal origin: Secondary | ICD-10-CM | POA: Diagnosis not present

## 2019-07-22 DIAGNOSIS — I120 Hypertensive chronic kidney disease with stage 5 chronic kidney disease or end stage renal disease: Secondary | ICD-10-CM | POA: Diagnosis not present

## 2019-07-22 DIAGNOSIS — N186 End stage renal disease: Secondary | ICD-10-CM | POA: Diagnosis not present

## 2019-07-22 DIAGNOSIS — Z992 Dependence on renal dialysis: Secondary | ICD-10-CM | POA: Diagnosis not present

## 2019-07-23 DIAGNOSIS — Z992 Dependence on renal dialysis: Secondary | ICD-10-CM | POA: Diagnosis not present

## 2019-07-23 DIAGNOSIS — D509 Iron deficiency anemia, unspecified: Secondary | ICD-10-CM | POA: Diagnosis not present

## 2019-07-23 DIAGNOSIS — N2581 Secondary hyperparathyroidism of renal origin: Secondary | ICD-10-CM | POA: Diagnosis not present

## 2019-07-23 DIAGNOSIS — N186 End stage renal disease: Secondary | ICD-10-CM | POA: Diagnosis not present

## 2019-07-25 DIAGNOSIS — D509 Iron deficiency anemia, unspecified: Secondary | ICD-10-CM | POA: Diagnosis not present

## 2019-07-25 DIAGNOSIS — N186 End stage renal disease: Secondary | ICD-10-CM | POA: Diagnosis not present

## 2019-07-25 DIAGNOSIS — Z992 Dependence on renal dialysis: Secondary | ICD-10-CM | POA: Diagnosis not present

## 2019-07-25 DIAGNOSIS — N2581 Secondary hyperparathyroidism of renal origin: Secondary | ICD-10-CM | POA: Diagnosis not present

## 2019-07-27 DIAGNOSIS — N2581 Secondary hyperparathyroidism of renal origin: Secondary | ICD-10-CM | POA: Diagnosis not present

## 2019-07-27 DIAGNOSIS — D509 Iron deficiency anemia, unspecified: Secondary | ICD-10-CM | POA: Diagnosis not present

## 2019-07-27 DIAGNOSIS — Z992 Dependence on renal dialysis: Secondary | ICD-10-CM | POA: Diagnosis not present

## 2019-07-27 DIAGNOSIS — N186 End stage renal disease: Secondary | ICD-10-CM | POA: Diagnosis not present

## 2019-07-30 DIAGNOSIS — Z992 Dependence on renal dialysis: Secondary | ICD-10-CM | POA: Diagnosis not present

## 2019-07-30 DIAGNOSIS — D509 Iron deficiency anemia, unspecified: Secondary | ICD-10-CM | POA: Diagnosis not present

## 2019-07-30 DIAGNOSIS — N2581 Secondary hyperparathyroidism of renal origin: Secondary | ICD-10-CM | POA: Diagnosis not present

## 2019-07-30 DIAGNOSIS — N186 End stage renal disease: Secondary | ICD-10-CM | POA: Diagnosis not present

## 2019-08-01 DIAGNOSIS — Z992 Dependence on renal dialysis: Secondary | ICD-10-CM | POA: Diagnosis not present

## 2019-08-01 DIAGNOSIS — D509 Iron deficiency anemia, unspecified: Secondary | ICD-10-CM | POA: Diagnosis not present

## 2019-08-01 DIAGNOSIS — N2581 Secondary hyperparathyroidism of renal origin: Secondary | ICD-10-CM | POA: Diagnosis not present

## 2019-08-01 DIAGNOSIS — N186 End stage renal disease: Secondary | ICD-10-CM | POA: Diagnosis not present

## 2019-08-03 DIAGNOSIS — N186 End stage renal disease: Secondary | ICD-10-CM | POA: Diagnosis not present

## 2019-08-03 DIAGNOSIS — N2581 Secondary hyperparathyroidism of renal origin: Secondary | ICD-10-CM | POA: Diagnosis not present

## 2019-08-03 DIAGNOSIS — D509 Iron deficiency anemia, unspecified: Secondary | ICD-10-CM | POA: Diagnosis not present

## 2019-08-03 DIAGNOSIS — Z992 Dependence on renal dialysis: Secondary | ICD-10-CM | POA: Diagnosis not present

## 2019-08-06 DIAGNOSIS — D509 Iron deficiency anemia, unspecified: Secondary | ICD-10-CM | POA: Diagnosis not present

## 2019-08-06 DIAGNOSIS — N2581 Secondary hyperparathyroidism of renal origin: Secondary | ICD-10-CM | POA: Diagnosis not present

## 2019-08-06 DIAGNOSIS — Z992 Dependence on renal dialysis: Secondary | ICD-10-CM | POA: Diagnosis not present

## 2019-08-06 DIAGNOSIS — N186 End stage renal disease: Secondary | ICD-10-CM | POA: Diagnosis not present

## 2019-08-08 DIAGNOSIS — N186 End stage renal disease: Secondary | ICD-10-CM | POA: Diagnosis not present

## 2019-08-08 DIAGNOSIS — Z992 Dependence on renal dialysis: Secondary | ICD-10-CM | POA: Diagnosis not present

## 2019-08-08 DIAGNOSIS — D509 Iron deficiency anemia, unspecified: Secondary | ICD-10-CM | POA: Diagnosis not present

## 2019-08-08 DIAGNOSIS — N2581 Secondary hyperparathyroidism of renal origin: Secondary | ICD-10-CM | POA: Diagnosis not present

## 2019-08-10 DIAGNOSIS — Z992 Dependence on renal dialysis: Secondary | ICD-10-CM | POA: Diagnosis not present

## 2019-08-10 DIAGNOSIS — D509 Iron deficiency anemia, unspecified: Secondary | ICD-10-CM | POA: Diagnosis not present

## 2019-08-10 DIAGNOSIS — N186 End stage renal disease: Secondary | ICD-10-CM | POA: Diagnosis not present

## 2019-08-10 DIAGNOSIS — N2581 Secondary hyperparathyroidism of renal origin: Secondary | ICD-10-CM | POA: Diagnosis not present

## 2019-08-12 DIAGNOSIS — N186 End stage renal disease: Secondary | ICD-10-CM | POA: Diagnosis not present

## 2019-08-12 DIAGNOSIS — N2581 Secondary hyperparathyroidism of renal origin: Secondary | ICD-10-CM | POA: Diagnosis not present

## 2019-08-12 DIAGNOSIS — D509 Iron deficiency anemia, unspecified: Secondary | ICD-10-CM | POA: Diagnosis not present

## 2019-08-12 DIAGNOSIS — Z992 Dependence on renal dialysis: Secondary | ICD-10-CM | POA: Diagnosis not present

## 2019-08-14 DIAGNOSIS — N2581 Secondary hyperparathyroidism of renal origin: Secondary | ICD-10-CM | POA: Diagnosis not present

## 2019-08-14 DIAGNOSIS — N186 End stage renal disease: Secondary | ICD-10-CM | POA: Diagnosis not present

## 2019-08-14 DIAGNOSIS — Z992 Dependence on renal dialysis: Secondary | ICD-10-CM | POA: Diagnosis not present

## 2019-08-14 DIAGNOSIS — D509 Iron deficiency anemia, unspecified: Secondary | ICD-10-CM | POA: Diagnosis not present

## 2019-08-17 DIAGNOSIS — Z992 Dependence on renal dialysis: Secondary | ICD-10-CM | POA: Diagnosis not present

## 2019-08-17 DIAGNOSIS — D509 Iron deficiency anemia, unspecified: Secondary | ICD-10-CM | POA: Diagnosis not present

## 2019-08-17 DIAGNOSIS — N2581 Secondary hyperparathyroidism of renal origin: Secondary | ICD-10-CM | POA: Diagnosis not present

## 2019-08-17 DIAGNOSIS — N186 End stage renal disease: Secondary | ICD-10-CM | POA: Diagnosis not present

## 2019-08-20 DIAGNOSIS — N186 End stage renal disease: Secondary | ICD-10-CM | POA: Diagnosis not present

## 2019-08-20 DIAGNOSIS — N2581 Secondary hyperparathyroidism of renal origin: Secondary | ICD-10-CM | POA: Diagnosis not present

## 2019-08-20 DIAGNOSIS — Z992 Dependence on renal dialysis: Secondary | ICD-10-CM | POA: Diagnosis not present

## 2019-08-20 DIAGNOSIS — D509 Iron deficiency anemia, unspecified: Secondary | ICD-10-CM | POA: Diagnosis not present

## 2019-09-06 ENCOUNTER — Other Ambulatory Visit: Payer: Self-pay

## 2019-09-06 ENCOUNTER — Ambulatory Visit (HOSPITAL_COMMUNITY)
Admission: EM | Admit: 2019-09-06 | Discharge: 2019-09-06 | Disposition: A | Discharge status: Home / Self Care | Payer: Medicare Other | Attending: Emergency Medicine | Admitting: Emergency Medicine

## 2019-09-06 DIAGNOSIS — R059 Cough, unspecified: Secondary | ICD-10-CM

## 2019-09-06 DIAGNOSIS — Z79899 Other long term (current) drug therapy: Secondary | ICD-10-CM | POA: Insufficient documentation

## 2019-09-06 DIAGNOSIS — Z8249 Family history of ischemic heart disease and other diseases of the circulatory system: Secondary | ICD-10-CM | POA: Insufficient documentation

## 2019-09-06 DIAGNOSIS — Z841 Family history of disorders of kidney and ureter: Secondary | ICD-10-CM | POA: Insufficient documentation

## 2019-09-06 DIAGNOSIS — R05 Cough: Secondary | ICD-10-CM

## 2019-09-06 DIAGNOSIS — Z992 Dependence on renal dialysis: Secondary | ICD-10-CM | POA: Insufficient documentation

## 2019-09-06 DIAGNOSIS — Z888 Allergy status to other drugs, medicaments and biological substances status: Secondary | ICD-10-CM | POA: Insufficient documentation

## 2019-09-06 DIAGNOSIS — Z20828 Contact with and (suspected) exposure to other viral communicable diseases: Secondary | ICD-10-CM | POA: Insufficient documentation

## 2019-09-06 DIAGNOSIS — I120 Hypertensive chronic kidney disease with stage 5 chronic kidney disease or end stage renal disease: Secondary | ICD-10-CM | POA: Diagnosis not present

## 2019-09-06 DIAGNOSIS — N186 End stage renal disease: Secondary | ICD-10-CM | POA: Diagnosis not present

## 2019-09-06 MED ORDER — METHYLPREDNISOLONE SODIUM SUCC 125 MG IJ SOLR
80.0000 mg | Freq: Once | INTRAMUSCULAR | Status: AC
  Administered 2019-09-06: 11:00:00 80 mg via INTRAMUSCULAR

## 2019-09-06 MED ORDER — BENZONATATE 200 MG PO CAPS: 200.0000 mg | ORAL_CAPSULE | Freq: Three times a day (TID) | ORAL | 0 refills | Status: AC | PRN

## 2019-09-06 MED ORDER — METHYLPREDNISOLONE SODIUM SUCC 125 MG IJ SOLR
INTRAMUSCULAR | Status: AC
  Filled 2019-09-06: qty 2

## 2019-09-06 NOTE — Discharge Instructions (Signed)
We gave you an injection of solumedrol for your cough Continue to rest and stay hydrated Tessalon every 8 hours as needed for cough COVID swab pending- quarantine until results return- approximately 2 days  Follow up if not improving or worsening

## 2019-09-06 NOTE — ED Triage Notes (Signed)
Patient presents to Urgent Care with complaints of cough and scratchy throat since approx 10 days ago. Patient reports she "needs a steroid shot and that is all", pt states her cough is worse first thing in the morning.

## 2019-09-06 NOTE — ED Provider Notes (Signed)
[place]    CSN: [id number] Arrival date & time: 09/06/19  1017      History   Chief Complaint Chief Complaint  Patient presents with  . Cough    HPI Victoria [name] is a 63 y.o. female history of ESRD on dialysis, hypertension, GERD, presenting today for evaluation of a cough and scratchy throat.  Patient states that symptoms began approximately 1 week ago.  She has had a cough which has led to her having a irritated throat.  Occasionally will have clear mucus production.  Denies any fevers chills or body aches.  Denies any nasal congestion or sinus pressure.  Denies any nausea vomiting or abdominal pain.  Feels similar to her typical cough she feels throughout the year.  States that she typically gets better with a steroid shot.  Denies shortness of breath.  Denies any known exposure to Covid or any close sick contacts.  HPI  Past Medical History:  Diagnosis Date  . Chronic bronchitis ([place])   . Complication of anesthesia ~ 2011   "they gave me a medicine that swolled me and mouth burning up" (08/23/2012)  . Complication of anesthesia    slow to wake up  . ESRD (end stage renal disease) on dialysis [facility]) Nephrologist-- dr deterding   ESRD due to HTN-; "M/W/F; [place]" (11/28/2015  . GERD (gastroesophageal reflux disease)   . History of acute pulmonary edema    2003  . Hypertension    no longer on medications  . Left patella fracture   . Pneumonia    as a child  . Seasonal allergies   . Secondary hyperparathyroidism, renal ([place])    s/p  total parathyroidectomy  2014  . Thyroid disease   . Wears glasses     Patient Active Problem List   Diagnosis Date Noted  . Dehiscence of operative wound 11/28/2015  . Wound dehiscence, surgical 11/28/2015  . Renal dialysis device, implant, or graft complication [id number]  . Aftercare following surgery of the circulatory system, [place] 03/30/2013  . Mechanical complication of other vascular device, implant, and graft  01/12/2013  . Other and unspecified hyperlipidemia 10/25/2012  . End stage renal disease ([place]) 09/22/2012  . Chest pain, mid sternal 08/23/2012  . Hyperparathyroidism, secondary renal ([place]) 03/22/2011  . Thyroid nodule 03/22/2011  . High blood pressure 03/10/2011  . Kidney disease 03/10/2011    Past Surgical History:  Procedure Laterality Date  . ANKLE FRACTURE SURGERY Right ~ 2005   "has pins in it"  . APPLICATION OF WOUND VAC Left 11/28/2015   knee  . APPLICATION OF WOUND VAC Left 11/28/2015   Procedure: APPLICATION OF WOUND VAC;  Surgeon: Rod Can, MD;  Location: [place];  Service: Orthopedics;  Laterality: Left;  . ARTERIOVENOUS GRAFT PLACEMENT  03-25-2005   Right forearm  w/  multiple Revision's   . CARDIOVASCULAR STRESS TEST  08-24-2012   abnormal nuclear study/  inferolateral and anteroseptal areas of scar,  no ischemia/  normal LV function and wall motion, ef 77%  . DIALYSIS FISTULA CREATION  1992   left upper arm ---  w/  Multiple Revision's until 2006  . ECTOPIC PREGNANCY SURGERY  1970's  . FRACTURE SURGERY    . I & D EXTREMITY Left 11/28/2015   Procedure: IRRIGATION AND DEBRIDEMENT LEFT KNEE WOUND ;  Surgeon: Rod Can, MD;  Location: [place];  Service: Orthopedics;  Laterality: Left;  . INCISION AND DRAINAGE OF WOUND Left 11/28/2015   knee  .  ORIF PATELLA Left 10/31/2015   Procedure: OPEN REDUCTION INTERNAL (ORIF) FIXATION LEFT PATELLA;  Surgeon: Rod Can, MD;  Location: [place];  Service: Orthopedics;  Laterality: Left;  . PATELLAR TENDON REPAIR  10/03/2012   Procedure: PATELLA TENDON REPAIR;  Surgeon: Marin Shutter, MD;  Location: [place];  Service: Orthopedics;  Laterality: Right;  . PATELLECTOMY  10/03/2012   Procedure: PATELLECTOMY;  Surgeon: Marin Shutter, MD;  Location: [place];  Service: Orthopedics;  Laterality: Right;  RIGHT PARTIAL PATELLECTOMY AND PATELLA TENDON REPAIR  . REVISION OF ARTERIOVENOUS GORETEX GRAFT  09/27/2012   Procedure:  REVISION OF ARTERIOVENOUS GORETEX GRAFT;  Surgeon: Mal Misty, MD;  Location: [place];  Service: Vascular;  Laterality: Right;  1) Replacement of venous half of loop with 65mm Gortex graft  2) Excision of erroded pseudoaneurysm of graft with primary closure.  Marland Kitchen REVISION OF ARTERIOVENOUS GORETEX GRAFT Right 01/23/2013   Procedure: REVISION OF ARTERIOVENOUS GORETEX GRAFT;  Surgeon: Conrad Wetherington, MD;  Location: [facility] OR;  Service: Vascular;  Laterality: Right;  Using piece of 70mm x 20cm Gortex graft.   Marland Kitchen REVISION OF ARTERIOVENOUS GORETEX GRAFT Right 10/07/2015   Procedure: REVISION OF Right arm ARTERIOVENOUS GORETEX GRAFT;  Surgeon: Elam Dutch, MD;  Location: [facility] OR;  Service: Vascular;  Laterality: Right;  . REVISION OF ARTERIOVENOUS GORETEX GRAFT Right 02/17/2016   Procedure: REVISION OF ARTERIOVENOUS GORETEX GRAFT;  Surgeon: Elam Dutch, MD;  Location: [place];  Service: Vascular;  Laterality: Right;  . TOTAL ABDOMINAL HYSTERECTOMY  03-05-2005   w/  Right Ovarian Cystectomy  . TOTAL PARATHYROIDECTOMY/  THYROID ISTHMUSECTOMY/  AUTOTRANSPLANTATION PARATHYROID TISSUE TO LEFT BRACHIORIADIALIS MUSCLE  02-18-2011  . TRANSTHORACIC ECHOCARDIOGRAM  10-31-2012   mild LVH,  grade 1 diastolic dysfunction,  ef 55-65%/  mild MR/  trivial TR    OB History   No obstetric history on file.      Home Medications    Prior to Admission medications   Medication Sig Start Date End Date Taking? Authorizing Provider  benzonatate (TESSALON) 200 MG capsule Take 1 capsule (200 mg total) by mouth 3 (three) times daily as needed for up to 7 days for cough. 09/06/19 09/13/19  Dayja Loveridge C, PA-C  calcium acetate (PHOSLO) 667 MG capsule Take 2,001 mg by mouth 3 (three) times daily with meals.     [provider]  ondansetron (ZOFRAN) 4 MG tablet Take 1 tablet (4 mg total) by mouth every 8 (eight) hours as needed for nausea or vomiting. 03/01/19   Domenic Moras, PA-C    Family History Family History    Problem Relation Age of Onset  . Healthy Mother   . Hypertension Father   . Kidney failure Father   . Hypertension Sister   . Thyroid disease Sister     Social History Social History   Tobacco Use  . Smoking status: Never Smoker  . Smokeless tobacco: Never Used  Substance Use Topics  . Alcohol use: No    Alcohol/week: 0.0 standard drinks    Comment: quit 2007  . Drug use: No     Allergies   Amlodipine and Lisinopril   Review of Systems Review of Systems  Constitutional: Negative for activity change, appetite change, chills, fatigue and fever.  HENT: Positive for sore throat. Negative for congestion, ear pain, rhinorrhea, sinus pressure and trouble swallowing.   Eyes: Negative for discharge and redness.  Respiratory: Positive for cough. Negative for chest tightness and shortness  of breath.   Cardiovascular: Negative for chest pain.  Gastrointestinal: Negative for abdominal pain, diarrhea, nausea and vomiting.  Musculoskeletal: Negative for myalgias.  Skin: Negative for rash.  Neurological: Negative for dizziness, light-headedness and headaches.     Physical Exam Triage Vital Signs ED Triage Vitals  Enc Vitals Group     BP 09/06/19 1047 102/75     Pulse Rate 09/06/19 1047 85     Resp 09/06/19 1047 16     Temp 09/06/19 1047 98.3 F (36.8 C)     Temp Source 09/06/19 1047 Oral     SpO2 09/06/19 1047 100 %     Weight --      Height --      Head Circumference --      Peak Flow --      Pain Score 09/06/19 1044 0     Pain Loc --      Pain Edu? --      Excl. in [place]? --    No data found.  Updated Vital Signs BP 102/75 (BP Location: Left Arm)   Pulse 85   Temp 98.3 F (36.8 C) (Oral)   Resp 16   SpO2 100%   Visual Acuity Right Eye Distance:   Left Eye Distance:   Bilateral Distance:    Right Eye Near:   Left Eye Near:    Bilateral Near:     Physical Exam Vitals and nursing note reviewed.  Constitutional:      General: She is not in acute  distress.    Appearance: She is well-developed.  HENT:     Head: Normocephalic and atraumatic.     Ears:     Comments: Moderate cerumen impaction bilaterally, TMs partially visualized and appear pearly gray, bony landmarks visible    Mouth/Throat:     Comments: Oral mucosa pink and moist, no tonsillar enlargement or exudate. Posterior pharynx patent and erythematous, no uvula deviation or swelling. Normal phonation. Eyes:     Conjunctiva/sclera: Conjunctivae normal.  Cardiovascular:     Rate and Rhythm: Normal rate and regular rhythm.     Heart sounds: No murmur.  Pulmonary:     Effort: Pulmonary effort is normal. No respiratory distress.     Breath sounds: Normal breath sounds.     Comments: Breathing comfortably at rest, CTABL, no wheezing, rales or other adventitious sounds auscultated No coughing during visit Abdominal:     Palpations: Abdomen is soft.     Tenderness: There is no abdominal tenderness.  Musculoskeletal:     Cervical back: Neck supple.  Skin:    General: Skin is warm and dry.  Neurological:     Mental Status: She is alert.      UC Treatments / Results  Labs (all labs ordered are listed, but only abnormal results are displayed) Labs Reviewed  NOVEL CORONAVIRUS, NAA (HOSP ORDER, SEND-OUT TO REF LAB; TAT 18-24 HRS)    EKG   Radiology No results found.  Procedures Procedures (including critical care time)  Medications Ordered in UC Medications  methylPREDNISolone sodium succinate (SOLU-MEDROL) 125 mg/2 mL injection 80 mg (has no administration in time range)    Initial Impression / Assessment and Plan / UC Course  I have reviewed the triage vital signs and the nursing notes.  Pertinent labs & imaging results that were available during my care of the patient were reviewed by me and considered in my medical decision making (see chart for details).     Covid PCR pending.  Will provide Solu-Medrol 80 mg as this is what she previously received with  relief of symptoms.  Tessalon for cough as needed.  Continue to rest and stay hydrated.  Continue to monitor,Discussed strict return precautions. Patient verbalized understanding and is agreeable with plan.    Final Clinical Impressions(s) / UC Diagnoses   Final diagnoses:  Cough     Discharge Instructions     We gave you an injection of solumedrol for your cough Continue to rest and stay hydrated Tessalon every 8 hours as needed for cough COVID swab pending- quarantine until results return- approximately 2 days  Follow up if not improving or worsening   ED Prescriptions    Medication Sig Dispense Auth. Provider   benzonatate (TESSALON) 200 MG capsule Take 1 capsule (200 mg total) by mouth 3 (three) times daily as needed for up to 7 days for cough. 28 capsule Keilana Morlock, [place] C, PA-C     PDMP not reviewed this encounter.   Janith Lima, [place] 09/06/19 1117

## 2019-09-07 LAB — NOVEL CORONAVIRUS, NAA (HOSP ORDER, SEND-OUT TO REF LAB; TAT 18-24 HRS): SARS-CoV-2, NAA: NOT DETECTED

## 2019-09-21 DIAGNOSIS — I120 Hypertensive chronic kidney disease with stage 5 chronic kidney disease or end stage renal disease: Secondary | ICD-10-CM | POA: Diagnosis not present

## 2019-09-21 DIAGNOSIS — N186 End stage renal disease: Secondary | ICD-10-CM | POA: Diagnosis not present

## 2019-09-21 DIAGNOSIS — Z992 Dependence on renal dialysis: Secondary | ICD-10-CM | POA: Diagnosis not present

## 2019-09-22 DIAGNOSIS — N2581 Secondary hyperparathyroidism of renal origin: Secondary | ICD-10-CM | POA: Diagnosis not present

## 2019-09-22 DIAGNOSIS — Z992 Dependence on renal dialysis: Secondary | ICD-10-CM | POA: Diagnosis not present

## 2019-09-22 DIAGNOSIS — N186 End stage renal disease: Secondary | ICD-10-CM | POA: Diagnosis not present

## 2019-09-24 DIAGNOSIS — N2581 Secondary hyperparathyroidism of renal origin: Secondary | ICD-10-CM | POA: Diagnosis not present

## 2019-09-24 DIAGNOSIS — Z992 Dependence on renal dialysis: Secondary | ICD-10-CM | POA: Diagnosis not present

## 2019-09-24 DIAGNOSIS — N186 End stage renal disease: Secondary | ICD-10-CM | POA: Diagnosis not present

## 2019-09-26 DIAGNOSIS — N2581 Secondary hyperparathyroidism of renal origin: Secondary | ICD-10-CM | POA: Diagnosis not present

## 2019-09-26 DIAGNOSIS — Z992 Dependence on renal dialysis: Secondary | ICD-10-CM | POA: Diagnosis not present

## 2019-09-26 DIAGNOSIS — N186 End stage renal disease: Secondary | ICD-10-CM | POA: Diagnosis not present

## 2019-09-28 DIAGNOSIS — N186 End stage renal disease: Secondary | ICD-10-CM | POA: Diagnosis not present

## 2019-09-28 DIAGNOSIS — Z992 Dependence on renal dialysis: Secondary | ICD-10-CM | POA: Diagnosis not present

## 2019-09-28 DIAGNOSIS — N2581 Secondary hyperparathyroidism of renal origin: Secondary | ICD-10-CM | POA: Diagnosis not present

## 2019-10-01 DIAGNOSIS — Z992 Dependence on renal dialysis: Secondary | ICD-10-CM | POA: Diagnosis not present

## 2019-10-01 DIAGNOSIS — N2581 Secondary hyperparathyroidism of renal origin: Secondary | ICD-10-CM | POA: Diagnosis not present

## 2019-10-01 DIAGNOSIS — N186 End stage renal disease: Secondary | ICD-10-CM | POA: Diagnosis not present

## 2019-10-03 DIAGNOSIS — Z992 Dependence on renal dialysis: Secondary | ICD-10-CM | POA: Diagnosis not present

## 2019-10-03 DIAGNOSIS — N186 End stage renal disease: Secondary | ICD-10-CM | POA: Diagnosis not present

## 2019-10-03 DIAGNOSIS — N2581 Secondary hyperparathyroidism of renal origin: Secondary | ICD-10-CM | POA: Diagnosis not present

## 2019-10-05 DIAGNOSIS — N186 End stage renal disease: Secondary | ICD-10-CM | POA: Diagnosis not present

## 2019-10-05 DIAGNOSIS — Z992 Dependence on renal dialysis: Secondary | ICD-10-CM | POA: Diagnosis not present

## 2019-10-05 DIAGNOSIS — N2581 Secondary hyperparathyroidism of renal origin: Secondary | ICD-10-CM | POA: Diagnosis not present

## 2019-10-08 DIAGNOSIS — N2581 Secondary hyperparathyroidism of renal origin: Secondary | ICD-10-CM | POA: Diagnosis not present

## 2019-10-08 DIAGNOSIS — N186 End stage renal disease: Secondary | ICD-10-CM | POA: Diagnosis not present

## 2019-10-08 DIAGNOSIS — T82858A Stenosis of vascular prosthetic devices, implants and grafts, initial encounter: Secondary | ICD-10-CM | POA: Diagnosis not present

## 2019-10-08 DIAGNOSIS — T82868A Thrombosis of vascular prosthetic devices, implants and grafts, initial encounter: Secondary | ICD-10-CM | POA: Diagnosis not present

## 2019-10-08 DIAGNOSIS — Z992 Dependence on renal dialysis: Secondary | ICD-10-CM | POA: Diagnosis not present

## 2019-10-10 DIAGNOSIS — N186 End stage renal disease: Secondary | ICD-10-CM | POA: Diagnosis not present

## 2019-10-10 DIAGNOSIS — N2581 Secondary hyperparathyroidism of renal origin: Secondary | ICD-10-CM | POA: Diagnosis not present

## 2019-10-10 DIAGNOSIS — Z992 Dependence on renal dialysis: Secondary | ICD-10-CM | POA: Diagnosis not present

## 2019-10-12 DIAGNOSIS — N2581 Secondary hyperparathyroidism of renal origin: Secondary | ICD-10-CM | POA: Diagnosis not present

## 2019-10-12 DIAGNOSIS — Z992 Dependence on renal dialysis: Secondary | ICD-10-CM | POA: Diagnosis not present

## 2019-10-12 DIAGNOSIS — N186 End stage renal disease: Secondary | ICD-10-CM | POA: Diagnosis not present

## 2019-10-15 DIAGNOSIS — N2581 Secondary hyperparathyroidism of renal origin: Secondary | ICD-10-CM | POA: Diagnosis not present

## 2019-10-15 DIAGNOSIS — N186 End stage renal disease: Secondary | ICD-10-CM | POA: Diagnosis not present

## 2019-10-15 DIAGNOSIS — Z992 Dependence on renal dialysis: Secondary | ICD-10-CM | POA: Diagnosis not present

## 2019-10-17 DIAGNOSIS — N186 End stage renal disease: Secondary | ICD-10-CM | POA: Diagnosis not present

## 2019-10-17 DIAGNOSIS — Z992 Dependence on renal dialysis: Secondary | ICD-10-CM | POA: Diagnosis not present

## 2019-10-17 DIAGNOSIS — N2581 Secondary hyperparathyroidism of renal origin: Secondary | ICD-10-CM | POA: Diagnosis not present

## 2019-10-19 DIAGNOSIS — N186 End stage renal disease: Secondary | ICD-10-CM | POA: Diagnosis not present

## 2019-10-19 DIAGNOSIS — Z992 Dependence on renal dialysis: Secondary | ICD-10-CM | POA: Diagnosis not present

## 2019-10-19 DIAGNOSIS — N2581 Secondary hyperparathyroidism of renal origin: Secondary | ICD-10-CM | POA: Diagnosis not present

## 2019-10-22 DIAGNOSIS — N186 End stage renal disease: Secondary | ICD-10-CM | POA: Diagnosis not present

## 2019-10-22 DIAGNOSIS — N2581 Secondary hyperparathyroidism of renal origin: Secondary | ICD-10-CM | POA: Diagnosis not present

## 2019-10-22 DIAGNOSIS — I120 Hypertensive chronic kidney disease with stage 5 chronic kidney disease or end stage renal disease: Secondary | ICD-10-CM | POA: Diagnosis not present

## 2019-10-22 DIAGNOSIS — Z992 Dependence on renal dialysis: Secondary | ICD-10-CM | POA: Diagnosis not present

## 2019-10-22 DIAGNOSIS — Z23 Encounter for immunization: Secondary | ICD-10-CM | POA: Diagnosis not present

## 2019-10-24 DIAGNOSIS — N186 End stage renal disease: Secondary | ICD-10-CM | POA: Diagnosis not present

## 2019-10-24 DIAGNOSIS — Z23 Encounter for immunization: Secondary | ICD-10-CM | POA: Diagnosis not present

## 2019-10-24 DIAGNOSIS — Z992 Dependence on renal dialysis: Secondary | ICD-10-CM | POA: Diagnosis not present

## 2019-10-24 DIAGNOSIS — N2581 Secondary hyperparathyroidism of renal origin: Secondary | ICD-10-CM | POA: Diagnosis not present

## 2019-10-26 DIAGNOSIS — Z23 Encounter for immunization: Secondary | ICD-10-CM | POA: Diagnosis not present

## 2019-10-26 DIAGNOSIS — N186 End stage renal disease: Secondary | ICD-10-CM | POA: Diagnosis not present

## 2019-10-26 DIAGNOSIS — N2581 Secondary hyperparathyroidism of renal origin: Secondary | ICD-10-CM | POA: Diagnosis not present

## 2019-10-26 DIAGNOSIS — Z992 Dependence on renal dialysis: Secondary | ICD-10-CM | POA: Diagnosis not present

## 2019-10-29 DIAGNOSIS — N2581 Secondary hyperparathyroidism of renal origin: Secondary | ICD-10-CM | POA: Diagnosis not present

## 2019-10-29 DIAGNOSIS — N186 End stage renal disease: Secondary | ICD-10-CM | POA: Diagnosis not present

## 2019-10-29 DIAGNOSIS — Z992 Dependence on renal dialysis: Secondary | ICD-10-CM | POA: Diagnosis not present

## 2019-10-29 DIAGNOSIS — Z23 Encounter for immunization: Secondary | ICD-10-CM | POA: Diagnosis not present

## 2019-10-31 DIAGNOSIS — N186 End stage renal disease: Secondary | ICD-10-CM | POA: Diagnosis not present

## 2019-10-31 DIAGNOSIS — N2581 Secondary hyperparathyroidism of renal origin: Secondary | ICD-10-CM | POA: Diagnosis not present

## 2019-10-31 DIAGNOSIS — Z992 Dependence on renal dialysis: Secondary | ICD-10-CM | POA: Diagnosis not present

## 2019-10-31 DIAGNOSIS — Z23 Encounter for immunization: Secondary | ICD-10-CM | POA: Diagnosis not present

## 2019-11-02 DIAGNOSIS — N186 End stage renal disease: Secondary | ICD-10-CM | POA: Diagnosis not present

## 2019-11-02 DIAGNOSIS — N2581 Secondary hyperparathyroidism of renal origin: Secondary | ICD-10-CM | POA: Diagnosis not present

## 2019-11-02 DIAGNOSIS — Z23 Encounter for immunization: Secondary | ICD-10-CM | POA: Diagnosis not present

## 2019-11-02 DIAGNOSIS — Z992 Dependence on renal dialysis: Secondary | ICD-10-CM | POA: Diagnosis not present

## 2019-11-05 DIAGNOSIS — N186 End stage renal disease: Secondary | ICD-10-CM | POA: Diagnosis not present

## 2019-11-05 DIAGNOSIS — Z23 Encounter for immunization: Secondary | ICD-10-CM | POA: Diagnosis not present

## 2019-11-05 DIAGNOSIS — Z992 Dependence on renal dialysis: Secondary | ICD-10-CM | POA: Diagnosis not present

## 2019-11-05 DIAGNOSIS — N2581 Secondary hyperparathyroidism of renal origin: Secondary | ICD-10-CM | POA: Diagnosis not present

## 2019-11-07 DIAGNOSIS — N186 End stage renal disease: Secondary | ICD-10-CM | POA: Diagnosis not present

## 2019-11-07 DIAGNOSIS — Z992 Dependence on renal dialysis: Secondary | ICD-10-CM | POA: Diagnosis not present

## 2019-11-07 DIAGNOSIS — Z23 Encounter for immunization: Secondary | ICD-10-CM | POA: Diagnosis not present

## 2019-11-07 DIAGNOSIS — N2581 Secondary hyperparathyroidism of renal origin: Secondary | ICD-10-CM | POA: Diagnosis not present

## 2019-11-09 DIAGNOSIS — Z992 Dependence on renal dialysis: Secondary | ICD-10-CM | POA: Diagnosis not present

## 2019-11-09 DIAGNOSIS — N2581 Secondary hyperparathyroidism of renal origin: Secondary | ICD-10-CM | POA: Diagnosis not present

## 2019-11-09 DIAGNOSIS — Z23 Encounter for immunization: Secondary | ICD-10-CM | POA: Diagnosis not present

## 2019-11-09 DIAGNOSIS — N186 End stage renal disease: Secondary | ICD-10-CM | POA: Diagnosis not present

## 2019-11-12 DIAGNOSIS — Z992 Dependence on renal dialysis: Secondary | ICD-10-CM | POA: Diagnosis not present

## 2019-11-12 DIAGNOSIS — N186 End stage renal disease: Secondary | ICD-10-CM | POA: Diagnosis not present

## 2019-11-12 DIAGNOSIS — N2581 Secondary hyperparathyroidism of renal origin: Secondary | ICD-10-CM | POA: Diagnosis not present

## 2019-11-12 DIAGNOSIS — Z23 Encounter for immunization: Secondary | ICD-10-CM | POA: Diagnosis not present

## 2019-11-14 DIAGNOSIS — Z23 Encounter for immunization: Secondary | ICD-10-CM | POA: Diagnosis not present

## 2019-11-14 DIAGNOSIS — N186 End stage renal disease: Secondary | ICD-10-CM | POA: Diagnosis not present

## 2019-11-14 DIAGNOSIS — N2581 Secondary hyperparathyroidism of renal origin: Secondary | ICD-10-CM | POA: Diagnosis not present

## 2019-11-14 DIAGNOSIS — Z992 Dependence on renal dialysis: Secondary | ICD-10-CM | POA: Diagnosis not present

## 2019-11-16 DIAGNOSIS — Z23 Encounter for immunization: Secondary | ICD-10-CM | POA: Diagnosis not present

## 2019-11-16 DIAGNOSIS — N186 End stage renal disease: Secondary | ICD-10-CM | POA: Diagnosis not present

## 2019-11-16 DIAGNOSIS — N2581 Secondary hyperparathyroidism of renal origin: Secondary | ICD-10-CM | POA: Diagnosis not present

## 2019-11-16 DIAGNOSIS — Z992 Dependence on renal dialysis: Secondary | ICD-10-CM | POA: Diagnosis not present

## 2019-11-19 DIAGNOSIS — I120 Hypertensive chronic kidney disease with stage 5 chronic kidney disease or end stage renal disease: Secondary | ICD-10-CM | POA: Diagnosis not present

## 2019-11-19 DIAGNOSIS — Z992 Dependence on renal dialysis: Secondary | ICD-10-CM | POA: Diagnosis not present

## 2019-11-19 DIAGNOSIS — N186 End stage renal disease: Secondary | ICD-10-CM | POA: Diagnosis not present

## 2019-11-19 DIAGNOSIS — N2581 Secondary hyperparathyroidism of renal origin: Secondary | ICD-10-CM | POA: Diagnosis not present

## 2019-11-19 DIAGNOSIS — Z23 Encounter for immunization: Secondary | ICD-10-CM | POA: Diagnosis not present

## 2019-11-21 DIAGNOSIS — N186 End stage renal disease: Secondary | ICD-10-CM | POA: Diagnosis not present

## 2019-11-21 DIAGNOSIS — Z992 Dependence on renal dialysis: Secondary | ICD-10-CM | POA: Diagnosis not present

## 2019-11-21 DIAGNOSIS — N2581 Secondary hyperparathyroidism of renal origin: Secondary | ICD-10-CM | POA: Diagnosis not present

## 2019-11-21 DIAGNOSIS — Z23 Encounter for immunization: Secondary | ICD-10-CM | POA: Diagnosis not present

## 2019-11-23 DIAGNOSIS — N186 End stage renal disease: Secondary | ICD-10-CM | POA: Diagnosis not present

## 2019-11-23 DIAGNOSIS — Z23 Encounter for immunization: Secondary | ICD-10-CM | POA: Diagnosis not present

## 2019-11-23 DIAGNOSIS — N2581 Secondary hyperparathyroidism of renal origin: Secondary | ICD-10-CM | POA: Diagnosis not present

## 2019-11-23 DIAGNOSIS — Z992 Dependence on renal dialysis: Secondary | ICD-10-CM | POA: Diagnosis not present

## 2019-11-26 DIAGNOSIS — N186 End stage renal disease: Secondary | ICD-10-CM | POA: Diagnosis not present

## 2019-11-26 DIAGNOSIS — Z992 Dependence on renal dialysis: Secondary | ICD-10-CM | POA: Diagnosis not present

## 2019-11-26 DIAGNOSIS — N2581 Secondary hyperparathyroidism of renal origin: Secondary | ICD-10-CM | POA: Diagnosis not present

## 2019-11-26 DIAGNOSIS — Z23 Encounter for immunization: Secondary | ICD-10-CM | POA: Diagnosis not present

## 2019-11-28 DIAGNOSIS — N186 End stage renal disease: Secondary | ICD-10-CM | POA: Diagnosis not present

## 2019-11-28 DIAGNOSIS — N2581 Secondary hyperparathyroidism of renal origin: Secondary | ICD-10-CM | POA: Diagnosis not present

## 2019-11-28 DIAGNOSIS — Z23 Encounter for immunization: Secondary | ICD-10-CM | POA: Diagnosis not present

## 2019-11-28 DIAGNOSIS — Z992 Dependence on renal dialysis: Secondary | ICD-10-CM | POA: Diagnosis not present

## 2019-11-30 DIAGNOSIS — N2581 Secondary hyperparathyroidism of renal origin: Secondary | ICD-10-CM | POA: Diagnosis not present

## 2019-11-30 DIAGNOSIS — Z23 Encounter for immunization: Secondary | ICD-10-CM | POA: Diagnosis not present

## 2019-11-30 DIAGNOSIS — N186 End stage renal disease: Secondary | ICD-10-CM | POA: Diagnosis not present

## 2019-11-30 DIAGNOSIS — Z992 Dependence on renal dialysis: Secondary | ICD-10-CM | POA: Diagnosis not present

## 2019-12-03 DIAGNOSIS — N2581 Secondary hyperparathyroidism of renal origin: Secondary | ICD-10-CM | POA: Diagnosis not present

## 2019-12-03 DIAGNOSIS — Z23 Encounter for immunization: Secondary | ICD-10-CM | POA: Diagnosis not present

## 2019-12-03 DIAGNOSIS — N186 End stage renal disease: Secondary | ICD-10-CM | POA: Diagnosis not present

## 2019-12-03 DIAGNOSIS — Z992 Dependence on renal dialysis: Secondary | ICD-10-CM | POA: Diagnosis not present

## 2019-12-05 DIAGNOSIS — N2581 Secondary hyperparathyroidism of renal origin: Secondary | ICD-10-CM | POA: Diagnosis not present

## 2019-12-05 DIAGNOSIS — N186 End stage renal disease: Secondary | ICD-10-CM | POA: Diagnosis not present

## 2019-12-05 DIAGNOSIS — Z23 Encounter for immunization: Secondary | ICD-10-CM | POA: Diagnosis not present

## 2019-12-05 DIAGNOSIS — Z992 Dependence on renal dialysis: Secondary | ICD-10-CM | POA: Diagnosis not present

## 2019-12-07 DIAGNOSIS — Z23 Encounter for immunization: Secondary | ICD-10-CM | POA: Diagnosis not present

## 2019-12-07 DIAGNOSIS — N186 End stage renal disease: Secondary | ICD-10-CM | POA: Diagnosis not present

## 2019-12-07 DIAGNOSIS — Z992 Dependence on renal dialysis: Secondary | ICD-10-CM | POA: Diagnosis not present

## 2019-12-07 DIAGNOSIS — N2581 Secondary hyperparathyroidism of renal origin: Secondary | ICD-10-CM | POA: Diagnosis not present

## 2019-12-10 DIAGNOSIS — Z23 Encounter for immunization: Secondary | ICD-10-CM | POA: Diagnosis not present

## 2019-12-10 DIAGNOSIS — N2581 Secondary hyperparathyroidism of renal origin: Secondary | ICD-10-CM | POA: Diagnosis not present

## 2019-12-10 DIAGNOSIS — Z992 Dependence on renal dialysis: Secondary | ICD-10-CM | POA: Diagnosis not present

## 2019-12-10 DIAGNOSIS — N186 End stage renal disease: Secondary | ICD-10-CM | POA: Diagnosis not present

## 2019-12-12 DIAGNOSIS — N2581 Secondary hyperparathyroidism of renal origin: Secondary | ICD-10-CM | POA: Diagnosis not present

## 2019-12-12 DIAGNOSIS — Z992 Dependence on renal dialysis: Secondary | ICD-10-CM | POA: Diagnosis not present

## 2019-12-12 DIAGNOSIS — Z23 Encounter for immunization: Secondary | ICD-10-CM | POA: Diagnosis not present

## 2019-12-12 DIAGNOSIS — N186 End stage renal disease: Secondary | ICD-10-CM | POA: Diagnosis not present

## 2019-12-14 DIAGNOSIS — N186 End stage renal disease: Secondary | ICD-10-CM | POA: Diagnosis not present

## 2019-12-14 DIAGNOSIS — Z992 Dependence on renal dialysis: Secondary | ICD-10-CM | POA: Diagnosis not present

## 2019-12-14 DIAGNOSIS — Z23 Encounter for immunization: Secondary | ICD-10-CM | POA: Diagnosis not present

## 2019-12-14 DIAGNOSIS — N2581 Secondary hyperparathyroidism of renal origin: Secondary | ICD-10-CM | POA: Diagnosis not present

## 2019-12-17 DIAGNOSIS — Z23 Encounter for immunization: Secondary | ICD-10-CM | POA: Diagnosis not present

## 2019-12-17 DIAGNOSIS — N186 End stage renal disease: Secondary | ICD-10-CM | POA: Diagnosis not present

## 2019-12-17 DIAGNOSIS — N2581 Secondary hyperparathyroidism of renal origin: Secondary | ICD-10-CM | POA: Diagnosis not present

## 2019-12-17 DIAGNOSIS — Z992 Dependence on renal dialysis: Secondary | ICD-10-CM | POA: Diagnosis not present

## 2019-12-19 DIAGNOSIS — Z992 Dependence on renal dialysis: Secondary | ICD-10-CM | POA: Diagnosis not present

## 2019-12-19 DIAGNOSIS — Z23 Encounter for immunization: Secondary | ICD-10-CM | POA: Diagnosis not present

## 2019-12-19 DIAGNOSIS — N186 End stage renal disease: Secondary | ICD-10-CM | POA: Diagnosis not present

## 2019-12-19 DIAGNOSIS — N2581 Secondary hyperparathyroidism of renal origin: Secondary | ICD-10-CM | POA: Diagnosis not present

## 2019-12-20 DIAGNOSIS — I120 Hypertensive chronic kidney disease with stage 5 chronic kidney disease or end stage renal disease: Secondary | ICD-10-CM | POA: Diagnosis not present

## 2019-12-20 DIAGNOSIS — N186 End stage renal disease: Secondary | ICD-10-CM | POA: Diagnosis not present

## 2019-12-20 DIAGNOSIS — Z992 Dependence on renal dialysis: Secondary | ICD-10-CM | POA: Diagnosis not present

## 2019-12-21 DIAGNOSIS — Z992 Dependence on renal dialysis: Secondary | ICD-10-CM | POA: Diagnosis not present

## 2019-12-21 DIAGNOSIS — N186 End stage renal disease: Secondary | ICD-10-CM | POA: Diagnosis not present

## 2019-12-21 DIAGNOSIS — N2581 Secondary hyperparathyroidism of renal origin: Secondary | ICD-10-CM | POA: Diagnosis not present

## 2019-12-24 DIAGNOSIS — N186 End stage renal disease: Secondary | ICD-10-CM | POA: Diagnosis not present

## 2019-12-24 DIAGNOSIS — Z992 Dependence on renal dialysis: Secondary | ICD-10-CM | POA: Diagnosis not present

## 2019-12-24 DIAGNOSIS — N2581 Secondary hyperparathyroidism of renal origin: Secondary | ICD-10-CM | POA: Diagnosis not present

## 2019-12-26 DIAGNOSIS — Z992 Dependence on renal dialysis: Secondary | ICD-10-CM | POA: Diagnosis not present

## 2019-12-26 DIAGNOSIS — N2581 Secondary hyperparathyroidism of renal origin: Secondary | ICD-10-CM | POA: Diagnosis not present

## 2019-12-26 DIAGNOSIS — N186 End stage renal disease: Secondary | ICD-10-CM | POA: Diagnosis not present

## 2019-12-28 DIAGNOSIS — N186 End stage renal disease: Secondary | ICD-10-CM | POA: Diagnosis not present

## 2019-12-28 DIAGNOSIS — N2581 Secondary hyperparathyroidism of renal origin: Secondary | ICD-10-CM | POA: Diagnosis not present

## 2019-12-28 DIAGNOSIS — Z992 Dependence on renal dialysis: Secondary | ICD-10-CM | POA: Diagnosis not present

## 2019-12-31 DIAGNOSIS — N186 End stage renal disease: Secondary | ICD-10-CM | POA: Diagnosis not present

## 2019-12-31 DIAGNOSIS — Z992 Dependence on renal dialysis: Secondary | ICD-10-CM | POA: Diagnosis not present

## 2019-12-31 DIAGNOSIS — N2581 Secondary hyperparathyroidism of renal origin: Secondary | ICD-10-CM | POA: Diagnosis not present

## 2020-01-02 DIAGNOSIS — N2581 Secondary hyperparathyroidism of renal origin: Secondary | ICD-10-CM | POA: Diagnosis not present

## 2020-01-02 DIAGNOSIS — N186 End stage renal disease: Secondary | ICD-10-CM | POA: Diagnosis not present

## 2020-01-02 DIAGNOSIS — Z992 Dependence on renal dialysis: Secondary | ICD-10-CM | POA: Diagnosis not present

## 2020-01-04 DIAGNOSIS — Z992 Dependence on renal dialysis: Secondary | ICD-10-CM | POA: Diagnosis not present

## 2020-01-04 DIAGNOSIS — N2581 Secondary hyperparathyroidism of renal origin: Secondary | ICD-10-CM | POA: Diagnosis not present

## 2020-01-04 DIAGNOSIS — N186 End stage renal disease: Secondary | ICD-10-CM | POA: Diagnosis not present

## 2020-01-07 DIAGNOSIS — N2581 Secondary hyperparathyroidism of renal origin: Secondary | ICD-10-CM | POA: Diagnosis not present

## 2020-01-07 DIAGNOSIS — N186 End stage renal disease: Secondary | ICD-10-CM | POA: Diagnosis not present

## 2020-01-07 DIAGNOSIS — Z992 Dependence on renal dialysis: Secondary | ICD-10-CM | POA: Diagnosis not present

## 2020-01-09 DIAGNOSIS — Z992 Dependence on renal dialysis: Secondary | ICD-10-CM | POA: Diagnosis not present

## 2020-01-09 DIAGNOSIS — N2581 Secondary hyperparathyroidism of renal origin: Secondary | ICD-10-CM | POA: Diagnosis not present

## 2020-01-09 DIAGNOSIS — N186 End stage renal disease: Secondary | ICD-10-CM | POA: Diagnosis not present

## 2020-01-11 DIAGNOSIS — Z992 Dependence on renal dialysis: Secondary | ICD-10-CM | POA: Diagnosis not present

## 2020-01-11 DIAGNOSIS — N186 End stage renal disease: Secondary | ICD-10-CM | POA: Diagnosis not present

## 2020-01-11 DIAGNOSIS — N2581 Secondary hyperparathyroidism of renal origin: Secondary | ICD-10-CM | POA: Diagnosis not present

## 2020-01-14 DIAGNOSIS — N2581 Secondary hyperparathyroidism of renal origin: Secondary | ICD-10-CM | POA: Diagnosis not present

## 2020-01-14 DIAGNOSIS — N186 End stage renal disease: Secondary | ICD-10-CM | POA: Diagnosis not present

## 2020-01-14 DIAGNOSIS — Z992 Dependence on renal dialysis: Secondary | ICD-10-CM | POA: Diagnosis not present

## 2020-01-16 DIAGNOSIS — N186 End stage renal disease: Secondary | ICD-10-CM | POA: Diagnosis not present

## 2020-01-16 DIAGNOSIS — N2581 Secondary hyperparathyroidism of renal origin: Secondary | ICD-10-CM | POA: Diagnosis not present

## 2020-01-16 DIAGNOSIS — Z992 Dependence on renal dialysis: Secondary | ICD-10-CM | POA: Diagnosis not present

## 2020-01-18 DIAGNOSIS — N186 End stage renal disease: Secondary | ICD-10-CM | POA: Diagnosis not present

## 2020-01-18 DIAGNOSIS — Z992 Dependence on renal dialysis: Secondary | ICD-10-CM | POA: Diagnosis not present

## 2020-01-18 DIAGNOSIS — N2581 Secondary hyperparathyroidism of renal origin: Secondary | ICD-10-CM | POA: Diagnosis not present

## 2020-01-19 DIAGNOSIS — N186 End stage renal disease: Secondary | ICD-10-CM | POA: Diagnosis not present

## 2020-01-19 DIAGNOSIS — Z992 Dependence on renal dialysis: Secondary | ICD-10-CM | POA: Diagnosis not present

## 2020-01-19 DIAGNOSIS — I120 Hypertensive chronic kidney disease with stage 5 chronic kidney disease or end stage renal disease: Secondary | ICD-10-CM | POA: Diagnosis not present

## 2020-01-21 DIAGNOSIS — N2581 Secondary hyperparathyroidism of renal origin: Secondary | ICD-10-CM | POA: Diagnosis not present

## 2020-01-21 DIAGNOSIS — N186 End stage renal disease: Secondary | ICD-10-CM | POA: Diagnosis not present

## 2020-01-21 DIAGNOSIS — Z992 Dependence on renal dialysis: Secondary | ICD-10-CM | POA: Diagnosis not present

## 2020-01-23 DIAGNOSIS — N2581 Secondary hyperparathyroidism of renal origin: Secondary | ICD-10-CM | POA: Diagnosis not present

## 2020-01-23 DIAGNOSIS — Z992 Dependence on renal dialysis: Secondary | ICD-10-CM | POA: Diagnosis not present

## 2020-01-23 DIAGNOSIS — N186 End stage renal disease: Secondary | ICD-10-CM | POA: Diagnosis not present

## 2020-01-25 DIAGNOSIS — N186 End stage renal disease: Secondary | ICD-10-CM | POA: Diagnosis not present

## 2020-01-25 DIAGNOSIS — Z992 Dependence on renal dialysis: Secondary | ICD-10-CM | POA: Diagnosis not present

## 2020-01-25 DIAGNOSIS — N2581 Secondary hyperparathyroidism of renal origin: Secondary | ICD-10-CM | POA: Diagnosis not present

## 2020-01-28 DIAGNOSIS — Z992 Dependence on renal dialysis: Secondary | ICD-10-CM | POA: Diagnosis not present

## 2020-01-28 DIAGNOSIS — N186 End stage renal disease: Secondary | ICD-10-CM | POA: Diagnosis not present

## 2020-01-28 DIAGNOSIS — N2581 Secondary hyperparathyroidism of renal origin: Secondary | ICD-10-CM | POA: Diagnosis not present

## 2020-01-30 DIAGNOSIS — N2581 Secondary hyperparathyroidism of renal origin: Secondary | ICD-10-CM | POA: Diagnosis not present

## 2020-01-30 DIAGNOSIS — Z992 Dependence on renal dialysis: Secondary | ICD-10-CM | POA: Diagnosis not present

## 2020-01-30 DIAGNOSIS — N186 End stage renal disease: Secondary | ICD-10-CM | POA: Diagnosis not present

## 2020-02-01 DIAGNOSIS — N2581 Secondary hyperparathyroidism of renal origin: Secondary | ICD-10-CM | POA: Diagnosis not present

## 2020-02-01 DIAGNOSIS — N186 End stage renal disease: Secondary | ICD-10-CM | POA: Diagnosis not present

## 2020-02-01 DIAGNOSIS — Z992 Dependence on renal dialysis: Secondary | ICD-10-CM | POA: Diagnosis not present

## 2020-02-04 DIAGNOSIS — N2581 Secondary hyperparathyroidism of renal origin: Secondary | ICD-10-CM | POA: Diagnosis not present

## 2020-02-04 DIAGNOSIS — N186 End stage renal disease: Secondary | ICD-10-CM | POA: Diagnosis not present

## 2020-02-04 DIAGNOSIS — Z992 Dependence on renal dialysis: Secondary | ICD-10-CM | POA: Diagnosis not present

## 2020-02-06 DIAGNOSIS — N186 End stage renal disease: Secondary | ICD-10-CM | POA: Diagnosis not present

## 2020-02-06 DIAGNOSIS — Z992 Dependence on renal dialysis: Secondary | ICD-10-CM | POA: Diagnosis not present

## 2020-02-06 DIAGNOSIS — N2581 Secondary hyperparathyroidism of renal origin: Secondary | ICD-10-CM | POA: Diagnosis not present

## 2020-02-08 DIAGNOSIS — Z992 Dependence on renal dialysis: Secondary | ICD-10-CM | POA: Diagnosis not present

## 2020-02-08 DIAGNOSIS — N186 End stage renal disease: Secondary | ICD-10-CM | POA: Diagnosis not present

## 2020-02-08 DIAGNOSIS — N2581 Secondary hyperparathyroidism of renal origin: Secondary | ICD-10-CM | POA: Diagnosis not present

## 2020-02-11 DIAGNOSIS — N2581 Secondary hyperparathyroidism of renal origin: Secondary | ICD-10-CM | POA: Diagnosis not present

## 2020-02-11 DIAGNOSIS — N186 End stage renal disease: Secondary | ICD-10-CM | POA: Diagnosis not present

## 2020-02-11 DIAGNOSIS — Z992 Dependence on renal dialysis: Secondary | ICD-10-CM | POA: Diagnosis not present

## 2020-02-13 DIAGNOSIS — N2581 Secondary hyperparathyroidism of renal origin: Secondary | ICD-10-CM | POA: Diagnosis not present

## 2020-02-13 DIAGNOSIS — N186 End stage renal disease: Secondary | ICD-10-CM | POA: Diagnosis not present

## 2020-02-13 DIAGNOSIS — Z992 Dependence on renal dialysis: Secondary | ICD-10-CM | POA: Diagnosis not present

## 2020-02-14 ENCOUNTER — Other Ambulatory Visit: Payer: Self-pay

## 2020-02-14 ENCOUNTER — Ambulatory Visit (HOSPITAL_COMMUNITY)
Admission: EM | Admit: 2020-02-14 | Discharge: 2020-02-14 | Disposition: A | Discharge status: Home / Self Care | Payer: Medicare Other

## 2020-02-14 DIAGNOSIS — J309 Allergic rhinitis, unspecified: Secondary | ICD-10-CM

## 2020-02-14 DIAGNOSIS — R05 Cough: Secondary | ICD-10-CM | POA: Diagnosis not present

## 2020-02-14 DIAGNOSIS — R0982 Postnasal drip: Secondary | ICD-10-CM | POA: Diagnosis not present

## 2020-02-14 DIAGNOSIS — R059 Cough, unspecified: Secondary | ICD-10-CM

## 2020-02-14 NOTE — ED Provider Notes (Addendum)
[place]    CSN: [id number] Arrival date & time: 02/14/20  1118      History   Chief Complaint No chief complaint on file.   HPI Victoria [name] is a 64 y.o. female.   Patient with history of end-stage renal disease on thrice weekly hemodialysis presents for evaluation of 2-week history of cough, scratchy throat and nasal congestion.  She reports symptoms started with nasal congestion and slight scratchy throat is progressed to a cough.  She denies productivity with the cough.  Denies shortness of breath or chest pain.  Denies fever or chills.  She reports throat is not severe.  She does report sneezing and watery eyes.  She reports she will get a cough like this occasionally throughout the year.  She reports she has received steroid injections before for this and joint pains that seemed of helped with the cough.  She denies taking medicines every day.  She reports her hemodialysis Monday Wednesday Friday.  Patient received a Covid vaccine completing this in early March.  She shows vaccination record    Patient was seen during downtime episode in full history was not available at the time.     Past Medical History:  Diagnosis Date  . Chronic bronchitis ([place])   . Complication of anesthesia ~ 2011   "they gave me a medicine that swolled me and mouth burning up" (08/23/2012)  . Complication of anesthesia    slow to wake up  . ESRD (end stage renal disease) on dialysis [facility]) Nephrologist-- dr deterding   ESRD due to HTN-; "M/W/F; [place]" (11/28/2015  . GERD (gastroesophageal reflux disease)   . History of acute pulmonary edema    2003  . Hypertension    no longer on medications  . Left patella fracture   . Pneumonia    as a child  . Seasonal allergies   . Secondary hyperparathyroidism, renal ([place])    s/p  total parathyroidectomy  2014  . Thyroid disease   . Wears glasses     Patient Active Problem List   Diagnosis Date Noted  . Dehiscence of operative  wound 11/28/2015  . Wound dehiscence, surgical 11/28/2015  . Renal dialysis device, implant, or graft complication 63/84/5364  . Aftercare following surgery of the circulatory system, [place] 03/30/2013  . Mechanical complication of other vascular device, implant, and graft 01/12/2013  . Other and unspecified hyperlipidemia 10/25/2012  . End stage renal disease ([place]) 09/22/2012  . Chest pain, mid sternal 08/23/2012  . Hyperparathyroidism, secondary renal ([place]) 03/22/2011  . Thyroid nodule 03/22/2011  . High blood pressure 03/10/2011  . Kidney disease 03/10/2011    Past Surgical History:  Procedure Laterality Date  . ANKLE FRACTURE SURGERY Right ~ 2005   "has pins in it"  . APPLICATION OF WOUND VAC Left 11/28/2015   knee  . APPLICATION OF WOUND VAC Left 11/28/2015   Procedure: APPLICATION OF WOUND VAC;  Surgeon: Rod Can, MD;  Location: [place];  Service: Orthopedics;  Laterality: Left;  . ARTERIOVENOUS GRAFT PLACEMENT  03-25-2005   Right forearm  w/  multiple Revision's   . CARDIOVASCULAR STRESS TEST  08-24-2012   abnormal nuclear study/  inferolateral and anteroseptal areas of scar,  no ischemia/  normal LV function and wall motion, ef 77%  . DIALYSIS FISTULA CREATION  1992   left upper arm ---  w/  Multiple Revision's until 2006  . ECTOPIC PREGNANCY SURGERY  1970's  . FRACTURE SURGERY    .  I & D EXTREMITY Left 11/28/2015   Procedure: IRRIGATION AND DEBRIDEMENT LEFT KNEE WOUND ;  Surgeon: Rod Can, MD;  Location: [place];  Service: Orthopedics;  Laterality: Left;  . INCISION AND DRAINAGE OF WOUND Left 11/28/2015   knee  . ORIF PATELLA Left 10/31/2015   Procedure: OPEN REDUCTION INTERNAL (ORIF) FIXATION LEFT PATELLA;  Surgeon: Rod Can, MD;  Location: [place];  Service: Orthopedics;  Laterality: Left;  . PATELLAR TENDON REPAIR  10/03/2012   Procedure: PATELLA TENDON REPAIR;  Surgeon: Marin Shutter, MD;  Location: [place];  Service: Orthopedics;  Laterality:  Right;  . PATELLECTOMY  10/03/2012   Procedure: PATELLECTOMY;  Surgeon: Marin Shutter, MD;  Location: [place];  Service: Orthopedics;  Laterality: Right;  RIGHT PARTIAL PATELLECTOMY AND PATELLA TENDON REPAIR  . REVISION OF ARTERIOVENOUS GORETEX GRAFT  09/27/2012   Procedure: REVISION OF ARTERIOVENOUS GORETEX GRAFT;  Surgeon: Mal Misty, MD;  Location: [place];  Service: Vascular;  Laterality: Right;  1) Replacement of venous half of loop with 19mm Gortex graft  2) Excision of erroded pseudoaneurysm of graft with primary closure.  Marland Kitchen REVISION OF ARTERIOVENOUS GORETEX GRAFT Right 01/23/2013   Procedure: REVISION OF ARTERIOVENOUS GORETEX GRAFT;  Surgeon: Conrad Steward, MD;  Location: [facility] OR;  Service: Vascular;  Laterality: Right;  Using piece of 68mm x 20cm Gortex graft.   Marland Kitchen REVISION OF ARTERIOVENOUS GORETEX GRAFT Right 10/07/2015   Procedure: REVISION OF Right arm ARTERIOVENOUS GORETEX GRAFT;  Surgeon: Elam Dutch, MD;  Location: [facility] OR;  Service: Vascular;  Laterality: Right;  . REVISION OF ARTERIOVENOUS GORETEX GRAFT Right 02/17/2016   Procedure: REVISION OF ARTERIOVENOUS GORETEX GRAFT;  Surgeon: Elam Dutch, MD;  Location: [place];  Service: Vascular;  Laterality: Right;  . TOTAL ABDOMINAL HYSTERECTOMY  03-05-2005   w/  Right Ovarian Cystectomy  . TOTAL PARATHYROIDECTOMY/  THYROID ISTHMUSECTOMY/  AUTOTRANSPLANTATION PARATHYROID TISSUE TO LEFT BRACHIORIADIALIS MUSCLE  02-18-2011  . TRANSTHORACIC ECHOCARDIOGRAM  10-31-2012   mild LVH,  grade 1 diastolic dysfunction,  ef 55-65%/  mild MR/  trivial TR    OB History   No obstetric history on file.      Home Medications    Prior to Admission medications   Medication Sig Start Date End Date Taking? Authorizing Provider  calcium acetate (PHOSLO) 667 MG capsule Take 2,001 mg by mouth 3 (three) times daily with meals.     [provider]  ondansetron (ZOFRAN) 4 MG tablet Take 1 tablet (4 mg total) by mouth every 8 (eight) hours as needed  for nausea or vomiting. 03/01/19   Domenic Moras, PA-C    Family History Family History  Problem Relation Age of Onset  . Healthy Mother   . Hypertension Father   . Kidney failure Father   . Hypertension Sister   . Thyroid disease Sister     Social History Social History   Tobacco Use  . Smoking status: Never Smoker  . Smokeless tobacco: Never Used  Substance Use Topics  . Alcohol use: No    Alcohol/week: 0.0 standard drinks    Comment: quit 2007  . Drug use: No     Allergies   Amlodipine and Lisinopril   Review of Systems Review of Systems   Physical Exam Triage Vital Signs ED Triage Vitals  Enc Vitals Group     BP      Pulse      Resp      Temp  Temp src      SpO2      Weight      Height      Head Circumference      Peak Flow      Pain Score      Pain Loc      Pain Edu?      Excl. in [place]?    Vital signs 98.6 temp Pulse 96 Respiratory rate 20 Blood pressure 105/69 O2 saturation 100% room air  No data found.  Updated Vital Signs There were no vitals taken for this visit.  Visual Acuity Right Eye Distance:   Left Eye Distance:   Bilateral Distance:    Right Eye Near:   Left Eye Near:    Bilateral Near:     Physical Exam Vitals and nursing note reviewed.  Constitutional:      General: She is not in acute distress.    Appearance: Normal appearance. She is well-developed. She is not ill-appearing.  HENT:     Head: Normocephalic and atraumatic.     Nose:     Comments: Bilateral turbinates with edema and boggy appearance.  Clear nasal discharge.  No facial tenderness Eyes:     Extraocular Movements: Extraocular movements intact.     Conjunctiva/sclera: Conjunctivae normal.     Pupils: Pupils are equal, round, and reactive to light.  Cardiovascular:     Rate and Rhythm: Normal rate and regular rhythm.     Heart sounds: No murmur.  Pulmonary:     Effort: Pulmonary effort is normal. No respiratory distress.     Breath sounds: Normal  breath sounds. No wheezing, rhonchi or rales.     Comments: Quiet breathing without accessory muscle use.  Speaking clearly and fluently in full sentences.  Saturating 100% on room air Abdominal:     Palpations: Abdomen is soft.     Tenderness: There is no abdominal tenderness.  Musculoskeletal:     Cervical back: Neck supple.     Right lower leg: No edema.     Left lower leg: No edema.  Skin:    General: Skin is warm and dry.  Neurological:     Mental Status: She is alert.      UC Treatments / Results  Labs (all labs ordered are listed, but only abnormal results are displayed) Labs Reviewed - No data to display  EKG   Radiology No results found.  Procedures Procedures (including critical care time)  Medications Ordered in UC Medications - No data to display  Initial Impression / Assessment and Plan / UC Course  I have reviewed the triage vital signs and the nursing notes.  Pertinent labs & imaging results that were available during my care of the patient were reviewed by me and considered in my medical decision making (see chart for details).     #Allergic rhinitis with postnasal drip #Cough Patient is a 64 year old hemodialysis patient presenting with symptoms most consistent with allergic rhinitis with postnasal drip driven cough.  Lungs are clear, saturating 100% and not in respiratory distress.  Patient was requesting steroid injection however I do not feel that this is necessary as I believe this is driven by her postnasal drip.  We will treat her nasal symptoms with Flonase daily and 5 mg Zyrtec 3 days a week.  We will give short course of Tessalon 100 mg every 12 hours.  Discussed utilizing the Zyrtec appropriately given she has a hemodialysis patient.  Paper prescription for these medicines was  given as patient was seen in discharge during downtime.  Return and emergency department precautions were discussed.  Patient verbalized understanding plan. Final Clinical  Impressions(s) / UC Diagnoses   Final diagnoses:  Allergic rhinitis with postnasal drip  Cough   Discharge Instructions   None    ED Prescriptions    None     PDMP not reviewed this encounter.   Purnell Shoemaker, PA-C 02/14/20 1446    Lorijean Husser, Marguerita Beards, PA-C 02/14/20 1448

## 2020-02-15 DIAGNOSIS — N2581 Secondary hyperparathyroidism of renal origin: Secondary | ICD-10-CM | POA: Diagnosis not present

## 2020-02-15 DIAGNOSIS — N186 End stage renal disease: Secondary | ICD-10-CM | POA: Diagnosis not present

## 2020-02-15 DIAGNOSIS — Z992 Dependence on renal dialysis: Secondary | ICD-10-CM | POA: Diagnosis not present

## 2020-02-18 DIAGNOSIS — N2581 Secondary hyperparathyroidism of renal origin: Secondary | ICD-10-CM | POA: Diagnosis not present

## 2020-02-18 DIAGNOSIS — N186 End stage renal disease: Secondary | ICD-10-CM | POA: Diagnosis not present

## 2020-02-18 DIAGNOSIS — Z992 Dependence on renal dialysis: Secondary | ICD-10-CM | POA: Diagnosis not present

## 2020-02-23 ENCOUNTER — Encounter (HOSPITAL_COMMUNITY): Payer: Self-pay | Admitting: Emergency Medicine

## 2020-02-23 ENCOUNTER — Emergency Department (HOSPITAL_COMMUNITY): Payer: Medicare Other

## 2020-02-23 ENCOUNTER — Other Ambulatory Visit: Payer: Self-pay

## 2020-02-23 ENCOUNTER — Emergency Department (HOSPITAL_COMMUNITY)
Admission: EM | Admit: 2020-02-23 | Discharge: 2020-02-23 | Disposition: A | Discharge status: Home / Self Care | Payer: Medicare Other | Attending: Emergency Medicine | Admitting: Emergency Medicine

## 2020-02-23 DIAGNOSIS — N186 End stage renal disease: Secondary | ICD-10-CM | POA: Insufficient documentation

## 2020-02-23 DIAGNOSIS — I120 Hypertensive chronic kidney disease with stage 5 chronic kidney disease or end stage renal disease: Secondary | ICD-10-CM | POA: Insufficient documentation

## 2020-02-23 DIAGNOSIS — G43009 Migraine without aura, not intractable, without status migrainosus: Secondary | ICD-10-CM

## 2020-02-23 DIAGNOSIS — Z992 Dependence on renal dialysis: Secondary | ICD-10-CM | POA: Diagnosis not present

## 2020-02-23 DIAGNOSIS — R519 Headache, unspecified: Secondary | ICD-10-CM | POA: Diagnosis not present

## 2020-02-23 LAB — COMPREHENSIVE METABOLIC PANEL
ALT: 11 U/L (ref 0–44)
AST: 17 U/L (ref 15–41)
Albumin: 3 g/dL — ABNORMAL LOW (ref 3.5–5.0)
Alkaline Phosphatase: 48 U/L (ref 38–126)
Anion gap: 14 (ref 5–15)
BUN: 18 mg/dL (ref 8–23)
CO2: 31 mmol/L (ref 22–32)
Calcium: 8.7 mg/dL — ABNORMAL LOW (ref 8.9–10.3)
Chloride: 94 mmol/L — ABNORMAL LOW (ref 98–111)
Creatinine, Ser: 7.93 mg/dL — ABNORMAL HIGH (ref 0.44–1.00)
GFR calc Af Amer: 6 mL/min — ABNORMAL LOW (ref >60)
GFR calc non Af Amer: 5 mL/min — ABNORMAL LOW (ref >60)
Glucose, Bld: 91 mg/dL (ref 70–99)
Potassium: 3.8 mmol/L (ref 3.5–5.1)
Sodium: 139 mmol/L (ref 135–145)
Total Bilirubin: 0.8 mg/dL (ref 0.3–1.2)
Total Protein: 6.7 g/dL (ref 6.5–8.1)

## 2020-02-23 LAB — CBC WITH DIFFERENTIAL/PLATELET
Abs Immature Granulocytes: 0.04 10*3/uL (ref 0.00–0.07)
Basophils Absolute: 0.1 10*3/uL (ref 0.0–0.1)
Basophils Relative: 1 %
Eosinophils Absolute: 0.5 10*3/uL (ref 0.0–0.5)
Eosinophils Relative: 6 %
HCT: 40.5 % (ref 36.0–46.0)
Hemoglobin: 12.8 g/dL (ref 12.0–15.0)
Immature Granulocytes: 0 %
Lymphocytes Relative: 23 %
Lymphs Abs: 2.1 10*3/uL (ref 0.7–4.0)
MCH: 25.7 pg — ABNORMAL LOW (ref 26.0–34.0)
MCHC: 31.6 g/dL (ref 30.0–36.0)
MCV: 81.3 fL (ref 80.0–100.0)
Monocytes Absolute: 0.9 10*3/uL (ref 0.1–1.0)
Monocytes Relative: 10 %
Neutro Abs: 5.4 10*3/uL (ref 1.7–7.7)
Neutrophils Relative %: 60 %
Platelets: 293 10*3/uL (ref 150–400)
RBC: 4.98 MIL/uL (ref 3.87–5.11)
RDW: 14.5 % (ref 11.5–15.5)
WBC: 9 10*3/uL (ref 4.0–10.5)
nRBC: 0 % (ref 0.0–0.2)

## 2020-02-23 LAB — TROPONIN I (HIGH SENSITIVITY)
Troponin I (High Sensitivity): 41 ng/L — ABNORMAL HIGH (ref <18)
Troponin I (High Sensitivity): 43 ng/L — ABNORMAL HIGH (ref <18)

## 2020-02-23 MED ORDER — ACETAMINOPHEN 325 MG PO TABS
650.0000 mg | ORAL_TABLET | Freq: Once | ORAL | Status: AC
  Administered 2020-02-23: 650 mg via ORAL
  Filled 2020-02-23: qty 2

## 2020-02-23 MED ORDER — PROCHLORPERAZINE MALEATE 5 MG PO TABS
10.0000 mg | ORAL_TABLET | Freq: Once | ORAL | Status: AC
  Administered 2020-02-23: 10 mg via ORAL
  Filled 2020-02-23: qty 2

## 2020-02-23 MED ORDER — DIPHENHYDRAMINE HCL 25 MG PO CAPS
25.0000 mg | ORAL_CAPSULE | Freq: Once | ORAL | Status: AC
  Administered 2020-02-23: 25 mg via ORAL
  Filled 2020-02-23: qty 1

## 2020-02-23 NOTE — ED Triage Notes (Signed)
Pt c/o 9/10 HA radiating to her neck and shoulder for the past 2 weeks getting worse today, pt is AO x4 no neuro deficit noticed.

## 2020-02-23 NOTE — ED Provider Notes (Signed)
[facility] [facility] EMERGENCY DEPARTMENT Provider Note   CSN: [id number] Arrival date & time: 02/23/20  0221     History Chief Complaint  Patient presents with  . Headache    Victoria [name] is a 64 y.o. female.  The history is provided by the patient.  Headache Pain location:  Frontal Quality:  Dull Radiates to:  Does not radiate Onset quality:  Gradual Duration:  2 weeks Timing:  Intermittent Progression:  Waxing and waning Chronicity:  New Context comment:  Headaches for two weeks, not typical of patient, concern about brain problem. Some relief with tylenol. Wants head CT, Relieved by:  Acetaminophen Worsened by:  Nothing Associated symptoms: no abdominal pain, no back pain, no blurred vision, no congestion, no cough, no diarrhea, no ear pain, no eye pain, no facial pain, no fatigue, no fever, no focal weakness, no hearing loss, no near-syncope, no neck pain, no neck stiffness, no numbness, no paresthesias, no photophobia, no seizures, no sore throat, no swollen glands, no syncope, no tingling, no URI and no vomiting        Past Medical History:  Diagnosis Date  . Chronic bronchitis ([place])   . Complication of anesthesia ~ 2011   "they gave me a medicine that swolled me and mouth burning up" (08/23/2012)  . Complication of anesthesia    slow to wake up  . ESRD (end stage renal disease) on dialysis [facility]) Nephrologist-- dr deterding   ESRD due to HTN-; "M/W/F; [place]" (11/28/2015  . GERD (gastroesophageal reflux disease)   . History of acute pulmonary edema    2003  . Hypertension    no longer on medications  . Left patella fracture   . Pneumonia    as a child  . Seasonal allergies   . Secondary hyperparathyroidism, renal ([place])    s/p  total parathyroidectomy  2014  . Thyroid disease   . Wears glasses     Patient Active Problem List   Diagnosis Date Noted  . Dehiscence of operative wound 11/28/2015  . Wound dehiscence, surgical 11/28/2015  .  Renal dialysis device, implant, or graft complication 40/98/1191  . Aftercare following surgery of the circulatory system, [place] 03/30/2013  . Mechanical complication of other vascular device, implant, and graft 01/12/2013  . Other and unspecified hyperlipidemia 10/25/2012  . End stage renal disease ([place]) 09/22/2012  . Chest pain, mid sternal 08/23/2012  . Hyperparathyroidism, secondary renal ([place]) 03/22/2011  . Thyroid nodule 03/22/2011  . High blood pressure 03/10/2011  . Kidney disease 03/10/2011    Past Surgical History:  Procedure Laterality Date  . ANKLE FRACTURE SURGERY Right ~ 2005   "has pins in it"  . APPLICATION OF WOUND VAC Left 11/28/2015   knee  . APPLICATION OF WOUND VAC Left 11/28/2015   Procedure: APPLICATION OF WOUND VAC;  Surgeon: Rod Can, MD;  Location: [place];  Service: Orthopedics;  Laterality: Left;  . ARTERIOVENOUS GRAFT PLACEMENT  03-25-2005   Right forearm  w/  multiple Revision's   . CARDIOVASCULAR STRESS TEST  08-24-2012   abnormal nuclear study/  inferolateral and anteroseptal areas of scar,  no ischemia/  normal LV function and wall motion, ef 77%  . DIALYSIS FISTULA CREATION  1992   left upper arm ---  w/  Multiple Revision's until 2006  . ECTOPIC PREGNANCY SURGERY  1970's  . FRACTURE SURGERY    . I & D EXTREMITY Left 11/28/2015   Procedure: IRRIGATION AND DEBRIDEMENT LEFT KNEE WOUND ;  Surgeon: Rod Can, MD;  Location: [facility];  Service: Orthopedics;  Laterality: Left;  . INCISION AND DRAINAGE OF WOUND Left 11/28/2015   knee  . ORIF PATELLA Left 10/31/2015   Procedure: OPEN REDUCTION INTERNAL (ORIF) FIXATION LEFT PATELLA;  Surgeon: Rod Can, MD;  Location: [place];  Service: Orthopedics;  Laterality: Left;  . PATELLAR TENDON REPAIR  10/03/2012   Procedure: PATELLA TENDON REPAIR;  Surgeon: Marin Shutter, MD;  Location: [facility];  Service: Orthopedics;  Laterality: Right;  . PATELLECTOMY  10/03/2012   Procedure: PATELLECTOMY;   Surgeon: Marin Shutter, MD;  Location: [facility];  Service: Orthopedics;  Laterality: Right;  RIGHT PARTIAL PATELLECTOMY AND PATELLA TENDON REPAIR  . REVISION OF ARTERIOVENOUS GORETEX GRAFT  09/27/2012   Procedure: REVISION OF ARTERIOVENOUS GORETEX GRAFT;  Surgeon: Mal Misty, MD;  Location: [facility];  Service: Vascular;  Laterality: Right;  1) Replacement of venous half of loop with 59mm Gortex graft  2) Excision of erroded pseudoaneurysm of graft with primary closure.  Marland Kitchen REVISION OF ARTERIOVENOUS GORETEX GRAFT Right 01/23/2013   Procedure: REVISION OF ARTERIOVENOUS GORETEX GRAFT;  Surgeon: Conrad Plummer, MD;  Location: [facility] OR;  Service: Vascular;  Laterality: Right;  Using piece of 45mm x 20cm Gortex graft.   Marland Kitchen REVISION OF ARTERIOVENOUS GORETEX GRAFT Right 10/07/2015   Procedure: REVISION OF Right arm ARTERIOVENOUS GORETEX GRAFT;  Surgeon: Elam Dutch, MD;  Location: [facility] OR;  Service: Vascular;  Laterality: Right;  . REVISION OF ARTERIOVENOUS GORETEX GRAFT Right 02/17/2016   Procedure: REVISION OF ARTERIOVENOUS GORETEX GRAFT;  Surgeon: Elam Dutch, MD;  Location: [place];  Service: Vascular;  Laterality: Right;  . TOTAL ABDOMINAL HYSTERECTOMY  03-05-2005   w/  Right Ovarian Cystectomy  . TOTAL PARATHYROIDECTOMY/  THYROID ISTHMUSECTOMY/  AUTOTRANSPLANTATION PARATHYROID TISSUE TO LEFT BRACHIORIADIALIS MUSCLE  02-18-2011  . TRANSTHORACIC ECHOCARDIOGRAM  10-31-2012   mild LVH,  grade 1 diastolic dysfunction,  ef 55-65%/  mild MR/  trivial TR     OB History   No obstetric history on file.     Family History  Problem Relation Age of Onset  . Healthy Mother   . Hypertension Father   . Kidney failure Father   . Hypertension Sister   . Thyroid disease Sister     Social History   Tobacco Use  . Smoking status: Never Smoker  . Smokeless tobacco: Never Used  Substance Use Topics  . Alcohol use: No    Alcohol/week: 0.0 standard drinks    Comment: quit 2007  . Drug use: No    Home  Medications Prior to Admission medications   Medication Sig Start Date End Date Taking? Authorizing Provider  calcium acetate (PHOSLO) 667 MG capsule Take 2,001 mg by mouth 3 (three) times daily with meals.     [provider]  ondansetron (ZOFRAN) 4 MG tablet Take 1 tablet (4 mg total) by mouth every 8 (eight) hours as needed for nausea or vomiting. 03/01/19   Domenic Moras, PA-C    Allergies    Amlodipine and Lisinopril  Review of Systems   Review of Systems  Constitutional: Negative for chills, fatigue and fever.  HENT: Negative for congestion, ear pain, hearing loss and sore throat.   Eyes: Negative for blurred vision, photophobia, pain and visual disturbance.  Respiratory: Negative for cough and shortness of breath.   Cardiovascular: Negative for chest pain, palpitations, syncope and near-syncope.  Gastrointestinal: Negative for abdominal pain, diarrhea and vomiting.  Genitourinary: Negative for dysuria and hematuria.  Musculoskeletal: Negative for arthralgias, back pain, neck pain and neck stiffness.  Skin: Negative for color change and rash.  Neurological: Positive for headaches. Negative for focal weakness, seizures, syncope, numbness and paresthesias.  All other systems reviewed and are negative.   Physical Exam Updated Vital Signs  ED Triage Vitals  Enc Vitals Group     BP 02/23/20 0240 102/71     Pulse Rate 02/23/20 0240 94     Resp 02/23/20 0240 17     Temp 02/23/20 0240 98.7 F (37.1 C)     Temp Source 02/23/20 0240 Oral     SpO2 02/23/20 0240 94 %     Weight 02/23/20 0240 170 lb (77.1 kg)     Height 02/23/20 0240 5\' 4"  (1.626 m)     Head Circumference --      Peak Flow --      Pain Score 02/23/20 0247 9     Pain Loc --      Pain Edu? --      Excl. in [place]? --     Physical Exam Vitals and nursing note reviewed.  Constitutional:      General: She is not in acute distress.    Appearance: She is well-developed. She is not ill-appearing.  HENT:      Head: Normocephalic and atraumatic.     Mouth/Throat:     Mouth: Mucous membranes are moist.  Eyes:     General: No visual field deficit.    Extraocular Movements: Extraocular movements intact.     Conjunctiva/sclera: Conjunctivae normal.     Pupils: Pupils are equal, round, and reactive to light.  Cardiovascular:     Rate and Rhythm: Normal rate and regular rhythm.     Heart sounds: Normal heart sounds. No murmur.  Pulmonary:     Effort: Pulmonary effort is normal. No respiratory distress.     Breath sounds: Normal breath sounds.  Abdominal:     Palpations: Abdomen is soft.     Tenderness: There is no abdominal tenderness.  Musculoskeletal:        General: Normal range of motion.     Cervical back: Normal range of motion and neck supple.  Skin:    General: Skin is warm and dry.  Neurological:     Mental Status: She is alert and oriented to person, place, and time.     Cranial Nerves: No cranial nerve deficit or dysarthria.     Sensory: No sensory deficit.     Motor: No weakness.     Coordination: Coordination normal.     Comments: 5+ out of 5 strength throughout, normal sensation, no drift  Psychiatric:        Mood and Affect: Mood normal.     ED Results / Procedures / Treatments   Labs (all labs ordered are listed, but only abnormal results are displayed) Labs Reviewed  CBC WITH DIFFERENTIAL/PLATELET - Abnormal; Notable for the following components:      Result Value   MCH 25.7 (*)    All other components within normal limits  COMPREHENSIVE METABOLIC PANEL - Abnormal; Notable for the following components:   Chloride 94 (*)    Creatinine, Ser 7.93 (*)    Calcium 8.7 (*)    Albumin 3.0 (*)    GFR calc non Af Amer 5 (*)    GFR calc Af Amer 6 (*)    All other components within normal limits  TROPONIN I (HIGH  SENSITIVITY) - Abnormal; Notable for the following components:   Troponin I (High Sensitivity) 41 (*)    All other components within normal limits  TROPONIN I  (HIGH SENSITIVITY) - Abnormal; Notable for the following components:   Troponin I (High Sensitivity) 43 (*)    All other components within normal limits    EKG EKG Interpretation  Date/Time:  Saturday February 23 2020 02:39:24 EDT Ventricular Rate:  93 PR Interval:  156 QRS Duration: 100 QT Interval:  384 QTC Calculation: 477 R Axis:   -41 Text Interpretation: Sinus rhythm with Fusion complexes and Premature atrial complexes Left axis deviation Low voltage QRS Confirmed by Lennice Sites [phone]) on 02/23/2020 8:54:53 AM   Radiology CT Head Wo Contrast  Result Date: 02/23/2020 CLINICAL DATA:  Headache. EXAM: CT HEAD WITHOUT CONTRAST TECHNIQUE: Contiguous axial images were obtained from the base of the skull through the vertex without intravenous contrast. COMPARISON:  06/27/2018 FINDINGS: Brain: No evidence of acute infarction, hemorrhage, hydrocephalus, extra-axial collection or mass lesion/mass effect. Vascular: No hyperdense vessel or unexpected calcification. Skull: Normal. Negative for fracture or focal lesion. Sinuses/Orbits: Normal globes and orbits. Mild frontal sinus mucosal thickening. Remaining visualized sinuses are clear. Other: None. IMPRESSION: 1. No intracranial abnormality. Electronically Signed   By: Lajean Manes M.D.   On: 02/23/2020 11:30    Procedures Procedures (including critical care time)  Medications Ordered in ED Medications  prochlorperazine (COMPAZINE) tablet 10 mg (10 mg Oral Given 02/23/20 1102)  diphenhydrAMINE (BENADRYL) capsule 25 mg (25 mg Oral Given 02/23/20 1102)  acetaminophen (TYLENOL) tablet 650 mg (650 mg Oral Given 02/23/20 1102)    ED Course  I have reviewed the triage vital signs and the nursing notes.  Pertinent labs & imaging results that were available during my care of the patient were reviewed by me and considered in my medical decision making (see chart for details).    MDM Rules/Calculators/A&P                      JAMEIA MAKRIS is a  64 year old female with history of end-stage renal disease presents to the ED with headache.  Patient normal vitals.  No fever.  Headache for the last 2 weeks.  Not typical of her to have headaches.  She is concerned about intracranial process.  Will get head CT although patient does have normal neurological exam.  Will make sure she does not have any type of head bleed or mass-effect.  No concern for stroke given history and physical.  Lab work overall unremarkable.  Troponin elevated to the low 40s but this is likely in the setting of ESRD.  Does not have any chest pain.  EKG shows sinus rhythm.  No ischemic changes.  Overall creatinine at baseline.  No significant electrolyte abnormality or leukocytosis.  Will get CT scan head.  Will give Compazine, Benadryl, Tylenol for headache relief.  Suspect sinus versus tension versus migraine type headache.  Patient with unremarkable head CT.  Has had much improvement with headache cocktail.  Discharged in good condition.  Recommend follow-up with primary care doctor.  This chart was dictated using voice recognition software.  Despite best efforts to proofread,  errors can occur which can change the documentation meaning.    Final Clinical Impression(s) / ED Diagnoses Final diagnoses:  Migraine without aura and without status migrainosus, not intractable    Rx / DC Orders ED Discharge Orders    None  Lennice Sites, DO 02/23/20 1155

## 2020-02-23 NOTE — ED Notes (Signed)
Patient Alert and oriented to baseline. Stable and ambulatory to baseline. Patient verbalized understanding of the discharge instructions.  Patient belongings were taken by the patient.   

## 2020-02-25 ENCOUNTER — Emergency Department (HOSPITAL_COMMUNITY)
Admission: EM | Admit: 2020-02-25 | Discharge: 2020-02-26 | Disposition: A | Discharge status: Home / Self Care | Payer: Medicare Other | Attending: Emergency Medicine | Admitting: Emergency Medicine

## 2020-02-25 ENCOUNTER — Encounter (HOSPITAL_COMMUNITY): Payer: Self-pay

## 2020-02-25 ENCOUNTER — Emergency Department (HOSPITAL_COMMUNITY): Payer: Medicare Other

## 2020-02-25 ENCOUNTER — Other Ambulatory Visit: Payer: Self-pay

## 2020-02-25 DIAGNOSIS — R05 Cough: Secondary | ICD-10-CM | POA: Diagnosis not present

## 2020-02-25 DIAGNOSIS — R112 Nausea with vomiting, unspecified: Secondary | ICD-10-CM | POA: Diagnosis not present

## 2020-02-25 DIAGNOSIS — G4489 Other headache syndrome: Secondary | ICD-10-CM | POA: Diagnosis not present

## 2020-02-25 DIAGNOSIS — J90 Pleural effusion, not elsewhere classified: Secondary | ICD-10-CM | POA: Diagnosis not present

## 2020-02-25 DIAGNOSIS — I517 Cardiomegaly: Secondary | ICD-10-CM | POA: Diagnosis not present

## 2020-02-25 DIAGNOSIS — Z992 Dependence on renal dialysis: Secondary | ICD-10-CM | POA: Insufficient documentation

## 2020-02-25 DIAGNOSIS — R519 Headache, unspecified: Secondary | ICD-10-CM | POA: Diagnosis not present

## 2020-02-25 DIAGNOSIS — I120 Hypertensive chronic kidney disease with stage 5 chronic kidney disease or end stage renal disease: Secondary | ICD-10-CM | POA: Diagnosis not present

## 2020-02-25 DIAGNOSIS — Z20822 Contact with and (suspected) exposure to covid-19: Secondary | ICD-10-CM | POA: Insufficient documentation

## 2020-02-25 DIAGNOSIS — N186 End stage renal disease: Secondary | ICD-10-CM | POA: Insufficient documentation

## 2020-02-25 DIAGNOSIS — I6782 Cerebral ischemia: Secondary | ICD-10-CM | POA: Insufficient documentation

## 2020-02-25 DIAGNOSIS — M542 Cervicalgia: Secondary | ICD-10-CM | POA: Diagnosis not present

## 2020-02-25 LAB — COMPREHENSIVE METABOLIC PANEL
ALT: 10 U/L (ref 0–44)
AST: 15 U/L (ref 15–41)
Albumin: 2.9 g/dL — ABNORMAL LOW (ref 3.5–5.0)
Alkaline Phosphatase: 46 U/L (ref 38–126)
Anion gap: 13 (ref 5–15)
BUN: 13 mg/dL (ref 8–23)
CO2: 33 mmol/L — ABNORMAL HIGH (ref 22–32)
Calcium: 8.5 mg/dL — ABNORMAL LOW (ref 8.9–10.3)
Chloride: 93 mmol/L — ABNORMAL LOW (ref 98–111)
Creatinine, Ser: 7.09 mg/dL — ABNORMAL HIGH (ref 0.44–1.00)
GFR calc Af Amer: 6 mL/min — ABNORMAL LOW (ref >60)
GFR calc non Af Amer: 6 mL/min — ABNORMAL LOW (ref >60)
Glucose, Bld: 114 mg/dL — ABNORMAL HIGH (ref 70–99)
Potassium: 3.6 mmol/L (ref 3.5–5.1)
Sodium: 139 mmol/L (ref 135–145)
Total Bilirubin: 0.7 mg/dL (ref 0.3–1.2)
Total Protein: 6.5 g/dL (ref 6.5–8.1)

## 2020-02-25 LAB — CBC
HCT: 39.6 % (ref 36.0–46.0)
Hemoglobin: 12.5 g/dL (ref 12.0–15.0)
MCH: 25.4 pg — ABNORMAL LOW (ref 26.0–34.0)
MCHC: 31.6 g/dL (ref 30.0–36.0)
MCV: 80.5 fL (ref 80.0–100.0)
Platelets: 290 10*3/uL (ref 150–400)
RBC: 4.92 MIL/uL (ref 3.87–5.11)
RDW: 14.5 % (ref 11.5–15.5)
WBC: 7.9 10*3/uL (ref 4.0–10.5)
nRBC: 0 % (ref 0.0–0.2)

## 2020-02-25 LAB — PHOSPHORUS: Phosphorus: 3.7 mg/dL (ref 2.5–4.6)

## 2020-02-25 MED ORDER — DIPHENHYDRAMINE HCL 50 MG/ML IJ SOLN
25.0000 mg | Freq: Once | INTRAMUSCULAR | Status: AC
  Administered 2020-02-26: 25 mg via INTRAVENOUS
  Filled 2020-02-25: qty 1

## 2020-02-25 MED ORDER — METOCLOPRAMIDE HCL 5 MG/ML IJ SOLN
10.0000 mg | Freq: Once | INTRAMUSCULAR | Status: AC
  Administered 2020-02-26: 10 mg via INTRAVENOUS
  Filled 2020-02-25: qty 2

## 2020-02-25 NOTE — ED Triage Notes (Signed)
Pt arrives POV for eval of HA x 1 week w/ neck pain. Pt also reports they called her from dialysis and informed her that her phosphorous level was "off". Pt reports she always feels badly when it is altered. Afebrile, NAD. Pt reports M/W/f dialysis days w/ no missed sessions. Neuro intact in triage.

## 2020-02-25 NOTE — ED Provider Notes (Signed)
[facility] EMERGENCY DEPARTMENT Provider Note   CSN: [id number] Arrival date & time: 02/25/20  1541     History Chief Complaint  Patient presents with  . Headache  . Abnormal Lab    LIMA CHILLEMI is a 64 y.o. female.  The history is provided by the patient.  Headache Pain location:  Frontal Quality:  Dull Radiates to:  Does not radiate Onset quality:  Gradual Duration:  1 week Timing:  Constant Progression:  Worsening Chronicity:  New Similar to prior headaches: no   Relieved by:  Nothing Worsened by:  Nothing Associated symptoms: cough, nausea, neck pain and vomiting   Associated symptoms: no fever, no neck stiffness, no numbness, no sore throat, no visual change and no weakness   Abnormal Lab Patient presents with headache.  Patient has a history of end-stage renal disease on dialysis.  She reports for at least a week she has had frontal headache.  No trauma.  No fevers.  The headache was gradual in onset.  No focal weakness.  She does not typically get headaches.  She was seen in the ER 2 days ago felt improved, but now headache is returned She went to dialysis today and was advised to come to ER to check her phosphorus levels She is not on anticoagulants.  No previous history of stroke No tick bites or rashes Patient also reports cough.  She reports she is fully vaccinated for Covid     Past Medical History:  Diagnosis Date  . Chronic bronchitis ([place])   . Complication of anesthesia ~ 2011   "they gave me a medicine that swolled me and mouth burning up" (08/23/2012)  . Complication of anesthesia    slow to wake up  . ESRD (end stage renal disease) on dialysis [facility]) Nephrologist-- dr deterding   ESRD due to HTN-; "M/W/F; [place]" (11/28/2015  . GERD (gastroesophageal reflux disease)   . History of acute pulmonary edema    2003  . Hypertension    no longer on medications  . Left patella fracture   . Pneumonia    as a child  . Seasonal  allergies   . Secondary hyperparathyroidism, renal ([place])    s/p  total parathyroidectomy  2014  . Thyroid disease   . Wears glasses     Patient Active Problem List   Diagnosis Date Noted  . Dehiscence of operative wound 11/28/2015  . Wound dehiscence, surgical 11/28/2015  . Renal dialysis device, implant, or graft complication 44/81/8563  . Aftercare following surgery of the circulatory system, [place] 03/30/2013  . Mechanical complication of other vascular device, implant, and graft 01/12/2013  . Other and unspecified hyperlipidemia 10/25/2012  . End stage renal disease ([place]) 09/22/2012  . Chest pain, mid sternal 08/23/2012  . Hyperparathyroidism, secondary renal ([place]) 03/22/2011  . Thyroid nodule 03/22/2011  . High blood pressure 03/10/2011  . Kidney disease 03/10/2011    Past Surgical History:  Procedure Laterality Date  . ANKLE FRACTURE SURGERY Right ~ 2005   "has pins in it"  . APPLICATION OF WOUND VAC Left 11/28/2015   knee  . APPLICATION OF WOUND VAC Left 11/28/2015   Procedure: APPLICATION OF WOUND VAC;  Surgeon: Rod Can, MD;  Location: [facility];  Service: Orthopedics;  Laterality: Left;  . ARTERIOVENOUS GRAFT PLACEMENT  03-25-2005   Right forearm  w/  multiple Revision's   . CARDIOVASCULAR STRESS TEST  08-24-2012   abnormal nuclear study/  inferolateral and anteroseptal areas of  scar,  no ischemia/  normal LV function and wall motion, ef 77%  . DIALYSIS FISTULA CREATION  1992   left upper arm ---  w/  Multiple Revision's until 2006  . ECTOPIC PREGNANCY SURGERY  1970's  . FRACTURE SURGERY    . I & D EXTREMITY Left 11/28/2015   Procedure: IRRIGATION AND DEBRIDEMENT LEFT KNEE WOUND ;  Surgeon: Rod Can, MD;  Location: [facility];  Service: Orthopedics;  Laterality: Left;  . INCISION AND DRAINAGE OF WOUND Left 11/28/2015   knee  . ORIF PATELLA Left 10/31/2015   Procedure: OPEN REDUCTION INTERNAL (ORIF) FIXATION LEFT PATELLA;  Surgeon: Rod Can, MD;  Location: [facility];  Service: Orthopedics;  Laterality: Left;  . PATELLAR TENDON REPAIR  10/03/2012   Procedure: PATELLA TENDON REPAIR;  Surgeon: Marin Shutter, MD;  Location: [facility];  Service: Orthopedics;  Laterality: Right;  . PATELLECTOMY  10/03/2012   Procedure: PATELLECTOMY;  Surgeon: Marin Shutter, MD;  Location: [facility];  Service: Orthopedics;  Laterality: Right;  RIGHT PARTIAL PATELLECTOMY AND PATELLA TENDON REPAIR  . REVISION OF ARTERIOVENOUS GORETEX GRAFT  09/27/2012   Procedure: REVISION OF ARTERIOVENOUS GORETEX GRAFT;  Surgeon: Mal Misty, MD;  Location: [facility] [place];  Service: Vascular;  Laterality: Right;  1) Replacement of venous half of loop with 29mm Gortex graft  2) Excision of erroded pseudoaneurysm of graft with primary closure.  Marland Kitchen REVISION OF ARTERIOVENOUS GORETEX GRAFT Right 01/23/2013   Procedure: REVISION OF ARTERIOVENOUS GORETEX GRAFT;  Surgeon: Conrad Cheat [place], MD;  Location: [facility] OR;  Service: Vascular;  Laterality: Right;  Using piece of 35mm x 20cm Gortex graft.   Marland Kitchen REVISION OF ARTERIOVENOUS GORETEX GRAFT Right 10/07/2015   Procedure: REVISION OF Right arm ARTERIOVENOUS GORETEX GRAFT;  Surgeon: Elam Dutch, MD;  Location: [facility] OR;  Service: Vascular;  Laterality: Right;  . REVISION OF ARTERIOVENOUS GORETEX GRAFT Right 02/17/2016   Procedure: REVISION OF ARTERIOVENOUS GORETEX GRAFT;  Surgeon: Elam Dutch, MD;  Location: [facility];  Service: Vascular;  Laterality: Right;  . TOTAL ABDOMINAL HYSTERECTOMY  03-05-2005   w/  Right Ovarian Cystectomy  . TOTAL PARATHYROIDECTOMY/  THYROID ISTHMUSECTOMY/  AUTOTRANSPLANTATION PARATHYROID TISSUE TO LEFT BRACHIORIADIALIS MUSCLE  02-18-2011  . TRANSTHORACIC ECHOCARDIOGRAM  10-31-2012   mild LVH,  grade 1 diastolic dysfunction,  ef 55-65%/  mild MR/  trivial TR     OB History   No obstetric history on file.     Family History  Problem Relation Age of Onset  . Healthy Mother   . Hypertension Father   . Kidney failure Father   .  Hypertension Sister   . Thyroid disease Sister     Social History   Tobacco Use  . Smoking status: Never Smoker  . Smokeless tobacco: Never Used  Substance Use Topics  . Alcohol use: No    Alcohol/week: 0.0 standard drinks    Comment: quit 2007  . Drug use: No    Home Medications Prior to Admission medications   Medication Sig Start Date End Date Taking? Authorizing Provider  calcium acetate (PHOSLO) 667 MG capsule Take 2,001 mg by mouth 3 (three) times daily with meals.     [provider]  ondansetron (ZOFRAN) 4 MG tablet Take 1 tablet (4 mg total) by mouth every 8 (eight) hours as needed for nausea or vomiting. 03/01/19   Domenic Moras, PA-C    Allergies    Amlodipine and Lisinopril  Review of Systems  Review of Systems  Constitutional: Negative for fever.  HENT: Negative for sore throat.   Respiratory: Positive for cough.   Gastrointestinal: Positive for nausea and vomiting.  Musculoskeletal: Positive for neck pain. Negative for neck stiffness.  Neurological: Positive for headaches. Negative for weakness and numbness.  All other systems reviewed and are negative.   Physical Exam Updated Vital Signs BP 130/79 (BP Location: Right Arm)   Pulse 93   Temp 99.2 F (37.3 C)   Resp 18   Ht 1.626 m (5\' 4" )   Wt 77 kg   SpO2 100%   BMI 29.14 kg/m   Physical Exam CONSTITUTIONAL: Well developed/well nourished, uncomfortable appearing HEAD: Normocephalic/atraumatic, no  Rash or erythema noted EYES: EOMI/PERRL, no nystagmus, no ptosis ENMT: Mucous membranes moist NECK: supple no meningeal signs SPINE/BACK:entire spine nontender CV: S1/S2 noted LUNGS: Lungs are clear to auscultation bilaterally, no apparent distress ABDOMEN: soft, nontender, no rebound or guarding GU:no cva tenderness NEURO:Awake/alert, face symmetric, no arm or leg drift is noted Equal 5/5 strength with shoulder abduction, elbow flex/extension, wrist flex/extension in upper extremities and  equal hand grips bilaterally Equal 5/5 strength with hip flexion,knee flex/extension, foot dorsi/plantar flexion Cranial nerves 3/4/5/6/03/28/09/11/12 tested and intact Sensation to light touch intact in all extremities EXTREMITIES: pulses normal, full ROM, dialysis access to right arm with thrill SKIN: warm, color normal PSYCH: no abnormalities of mood noted, alert and oriented to situation  ED Results / Procedures / Treatments   Labs (all labs ordered are listed, but only abnormal results are displayed) Labs Reviewed  CBC - Abnormal; Notable for the following components:      Result Value   MCH 25.4 (*)    All other components within normal limits  COMPREHENSIVE METABOLIC PANEL - Abnormal; Notable for the following components:   Chloride 93 (*)    CO2 33 (*)    Glucose, Bld 114 (*)    Creatinine, Ser 7.09 (*)    Calcium 8.5 (*)    Albumin 2.9 (*)    GFR calc non Af Amer 6 (*)    GFR calc Af Amer 6 (*)    All other components within normal limits  SEDIMENTATION RATE - Abnormal; Notable for the following components:   Sed Rate 41 (*)    All other components within normal limits  SARS CORONAVIRUS 2 BY RT PCR (HOSPITAL ORDER, [place] LAB)  PHOSPHORUS  ROCKY MTN SPOTTED FVR ABS PNL(IGG+IGM)    EKG None  Radiology MR BRAIN WO CONTRAST  Result Date: 02/26/2020 CLINICAL DATA:  Headache for 1 week with neck pain EXAM: MRI HEAD WITHOUT CONTRAST MRV HEAD WITHOUT CONTRAST TECHNIQUE: Multiplanar, multiecho pulse sequences of the brain and surrounding structures were obtained without intravenous contrast. Angiographic images of the intracranial venous structures were obtained using MRV technique without intravenous contrast. COMPARISON:  Head CT from 3 days ago FINDINGS: Brain MRI: Brain: No acute infarct, hemorrhage, hydrocephalus, or masslike finding. Mild chronic small vessel ischemia. Brain volume is normal. Vascular: Normal flow voids. Marrow: No focal lesion.  Sinuses and orbits: Negative. Intracranial MRV: Robust flow in dural venous sinuses and paired deep veins. No evident fistulous connection to the arterial tree. Left-sided scalp and facial veins are larger than the right which correlates with a dominant left-sided EJ vein on a 2019 neck CT. IMPRESSION: Brain MRI: 1. No acute or reversible finding. 2. Mild chronic small vessel ischemia. Intracranial MRV: Negative. Electronically Signed   By: Neva Seat.D.  On: 02/26/2020 04:22   DG Chest Port 1 View  Result Date: 02/25/2020 CLINICAL DATA:  Shortness of breath, headache for 1 week and neck pain EXAM: PORTABLE CHEST 1 VIEW COMPARISON:  Radiograph 09/30/2019 FINDINGS: Chronic elevation of left hemidiaphragm with adjacent opacity likely reflecting some atelectatic change. Additional hazy opacities in the right lung base likely to reflect further atelectatic changes. No focal consolidation. Mild vascular congestion without frank edema. Trace left effusion. Cardiomegaly is similar to priors accounting for differences in technique. The aorta is calcified. The remaining cardiomediastinal contours are unremarkable. No acute osseous or soft tissue abnormality. Surgical clips of the left neck likely reflecting prior thyroidectomy. IMPRESSION: 1. Chronic elevation of left hemidiaphragm with adjacent atelectatic change. Suspect trace left effusion. 2. Additional hazy opacities in the right lung base likely reflect further atelectatic changes. 3. Cardiomegaly with mild vascular congestion. Electronically Signed   By: Lovena Le M.D.   On: 02/25/2020 23:51   MR MRV HEAD WO CM  Result Date: 02/26/2020 CLINICAL DATA:  Headache for 1 week with neck pain EXAM: MRI HEAD WITHOUT CONTRAST MRV HEAD WITHOUT CONTRAST TECHNIQUE: Multiplanar, multiecho pulse sequences of the brain and surrounding structures were obtained without intravenous contrast. Angiographic images of the intracranial venous structures were obtained using  MRV technique without intravenous contrast. COMPARISON:  Head CT from 3 days ago FINDINGS: Brain MRI: Brain: No acute infarct, hemorrhage, hydrocephalus, or masslike finding. Mild chronic small vessel ischemia. Brain volume is normal. Vascular: Normal flow voids. Marrow: No focal lesion. Sinuses and orbits: Negative. Intracranial MRV: Robust flow in dural venous sinuses and paired deep veins. No evident fistulous connection to the arterial tree. Left-sided scalp and facial veins are larger than the right which correlates with a dominant left-sided EJ vein on a 2019 neck CT. IMPRESSION: Brain MRI: 1. No acute or reversible finding. 2. Mild chronic small vessel ischemia. Intracranial MRV: Negative. Electronically Signed   By: Monte Fantasia M.D.   On: 02/26/2020 04:22    Procedures Procedures  Medications Ordered in ED Medications  LORazepam (ATIVAN) injection 0.5 mg (0 mg Intravenous Hold 02/26/20 0028)  metoCLOPramide (REGLAN) injection 10 mg (10 mg Intravenous Given 02/26/20 0014)  diphenhydrAMINE (BENADRYL) injection 25 mg (25 mg Intravenous Given 02/26/20 0015)  metoCLOPramide (REGLAN) injection 5 mg (5 mg Intravenous Given 02/26/20 0457)  diphenhydrAMINE (BENADRYL) injection 12.5 mg (12.5 mg Intravenous Given 02/26/20 0457)    ED Course  I have reviewed the triage vital signs and the nursing notes.  Pertinent labs & imaging results that were available during my care of the patient were reviewed by me and considered in my medical decision making (see chart for details).    MDM Rules/Calculators/A&P                      12:26 AM Patient presents for repeat ER evaluation for headache.  Patient reports he typically does not have headaches.  Patient is uncomfortable appearing and is high risk and she is on dialysis.  Patient is afebrile, no meningeal signs.  Low suspicion for meningitis.  She is already had a negative CT head.  I expanded her lab evaluation, and will proceed with MRI brain/MRV brain  without contrast 5:08 AM MRI/MRV both negative.  Patient appears improved.  She ambulates without difficulty. She is afebrile, no meningeal signs to suggest meningitis.  History is not consistent with subarachnoid hemorrhage.  She has no focal weakness to suggest stroke.  No signs of any vascular  syndrome such as carotid dissection Overall patient is well-appearing.  I feel she is appropriate for discharge home. Sed rate not suggestive of temporal arteritis RMSF labs are pending, the low suspicion for tickborne illness Patient will be discharged home.  Short course of pain medications have been provided.  She will be given an urgent referral to neurology for management of headache We discussed strict ER return precautions   This patient presents to the ED for concern of headache, this involves an extensive number of treatment options, and is a complaint that carries with it a high risk of complications and morbidity.  The differential diagnosis includes meningitis, stroke, temporal arteritis, ICH, SAH, migraine   Lab Tests:   I Ordered, reviewed, and interpreted labs, which included cbc, bmp, phosphorous  Medicines ordered:   I ordered medication reglan/benadryl  For headache   Imaging Studies ordered:   I ordered imaging studies which included Chest xray and MRI  I independently visualized and interpreted imaging which showed no acute findings  Additional history obtained:    Previous records obtained and reviewed   Consultations Obtained:  I consulted radiology Dr Collins [place] - recommends MRI Brain, MRV without contrast for a dialysis patient   Reevaluation:  After the interventions stated above, I reevaluated the patient and found patient is improved    Final Clinical Impression(s) / ED Diagnoses Final diagnoses:  Other headache syndrome    Rx / DC Orders ED Discharge Orders         Ordered    HYDROcodone-acetaminophen (NORCO/VICODIN) 5-325 MG tablet  Every 6 hours  PRN     02/26/20 0507    Ambulatory referral to Neurology    Comments: An appointment is requested in approximately: 1 week - for headache evaluation   02/26/20 0507           Ripley Fraise, MD 02/26/20 0510

## 2020-02-26 ENCOUNTER — Other Ambulatory Visit: Payer: Self-pay

## 2020-02-26 ENCOUNTER — Emergency Department (HOSPITAL_COMMUNITY): Payer: Medicare Other

## 2020-02-26 DIAGNOSIS — G4489 Other headache syndrome: Secondary | ICD-10-CM | POA: Diagnosis not present

## 2020-02-26 DIAGNOSIS — R519 Headache, unspecified: Secondary | ICD-10-CM | POA: Diagnosis not present

## 2020-02-26 LAB — SARS CORONAVIRUS 2 BY RT PCR (HOSPITAL ORDER, PERFORMED IN ~~LOC~~ HOSPITAL LAB): SARS Coronavirus 2: NEGATIVE

## 2020-02-26 LAB — SEDIMENTATION RATE: Sed Rate: 41 mm/hr — ABNORMAL HIGH (ref 0–22)

## 2020-02-26 MED ORDER — METOCLOPRAMIDE HCL 5 MG/ML IJ SOLN
5.0000 mg | Freq: Once | INTRAMUSCULAR | Status: AC
  Administered 2020-02-26: 5 mg via INTRAVENOUS
  Filled 2020-02-26: qty 2

## 2020-02-26 MED ORDER — DIPHENHYDRAMINE HCL 50 MG/ML IJ SOLN
12.5000 mg | Freq: Once | INTRAMUSCULAR | Status: AC
  Administered 2020-02-26: 12.5 mg via INTRAVENOUS
  Filled 2020-02-26: qty 1

## 2020-02-26 MED ORDER — HYDROCODONE-ACETAMINOPHEN 5-325 MG PO TABS: 1.0000 | ORAL_TABLET | Freq: Four times a day (QID) | ORAL | 0 refills | Status: DC | PRN

## 2020-02-26 MED ORDER — LORAZEPAM 2 MG/ML IJ SOLN: 0.5000 mg | Freq: Once | INTRAMUSCULAR | Status: DC

## 2020-02-26 NOTE — ED Notes (Signed)
Discharge instructions discussed with pt. Pt verbalized understanding. Pt stable and ambulatory. No signature pad available. 

## 2020-02-26 NOTE — Discharge Instructions (Signed)

## 2020-02-28 LAB — ROCKY MTN SPOTTED FVR ABS PNL(IGG+IGM)
RMSF IgG: POSITIVE — AB
RMSF IgM: 0.3 index (ref 0.00–0.89)

## 2020-02-28 LAB — RMSF, IGG, IFA: RMSF, IGG, IFA: <1:64 {titer}

## 2020-03-03 ENCOUNTER — Encounter (HOSPITAL_COMMUNITY): Payer: Self-pay

## 2020-03-03 ENCOUNTER — Emergency Department (HOSPITAL_COMMUNITY)
Admission: EM | Admit: 2020-03-03 | Discharge: 2020-03-04 | Disposition: A | Discharge status: Home / Self Care | Payer: Medicare Other | Attending: Emergency Medicine | Admitting: Emergency Medicine

## 2020-03-03 ENCOUNTER — Other Ambulatory Visit: Payer: Self-pay

## 2020-03-03 DIAGNOSIS — Z79899 Other long term (current) drug therapy: Secondary | ICD-10-CM | POA: Diagnosis not present

## 2020-03-03 DIAGNOSIS — Z992 Dependence on renal dialysis: Secondary | ICD-10-CM | POA: Diagnosis not present

## 2020-03-03 DIAGNOSIS — R519 Headache, unspecified: Secondary | ICD-10-CM | POA: Diagnosis not present

## 2020-03-03 DIAGNOSIS — N186 End stage renal disease: Secondary | ICD-10-CM | POA: Insufficient documentation

## 2020-03-03 DIAGNOSIS — I120 Hypertensive chronic kidney disease with stage 5 chronic kidney disease or end stage renal disease: Secondary | ICD-10-CM | POA: Insufficient documentation

## 2020-03-03 LAB — CBC
HCT: 38.6 % (ref 36.0–46.0)
Hemoglobin: 12.4 g/dL (ref 12.0–15.0)
MCH: 25.4 pg — ABNORMAL LOW (ref 26.0–34.0)
MCHC: 32.1 g/dL (ref 30.0–36.0)
MCV: 79.1 fL — ABNORMAL LOW (ref 80.0–100.0)
Platelets: 339 10*3/uL (ref 150–400)
RBC: 4.88 MIL/uL (ref 3.87–5.11)
RDW: 14.3 % (ref 11.5–15.5)
WBC: 9.2 10*3/uL (ref 4.0–10.5)
nRBC: 0 % (ref 0.0–0.2)

## 2020-03-03 LAB — BASIC METABOLIC PANEL
Anion gap: 13 (ref 5–15)
BUN: 15 mg/dL (ref 8–23)
CO2: 32 mmol/L (ref 22–32)
Calcium: 9 mg/dL (ref 8.9–10.3)
Chloride: 92 mmol/L — ABNORMAL LOW (ref 98–111)
Creatinine, Ser: 6.41 mg/dL — ABNORMAL HIGH (ref 0.44–1.00)
GFR calc Af Amer: 7 mL/min — ABNORMAL LOW (ref >60)
GFR calc non Af Amer: 6 mL/min — ABNORMAL LOW (ref >60)
Glucose, Bld: 79 mg/dL (ref 70–99)
Potassium: 3.6 mmol/L (ref 3.5–5.1)
Sodium: 137 mmol/L (ref 135–145)

## 2020-03-03 NOTE — ED Triage Notes (Signed)
Pt arrives to ED w/ c/o 10/10 headache x 3 weeks. Pt seen last week for the same and had CT scan and MRI done which was negative. Pt states pain has persisted despite medication.

## 2020-03-04 DIAGNOSIS — R519 Headache, unspecified: Secondary | ICD-10-CM | POA: Diagnosis not present

## 2020-03-04 MED ORDER — ACETAMINOPHEN 325 MG PO TABS
650.0000 mg | ORAL_TABLET | Freq: Once | ORAL | Status: AC
  Administered 2020-03-04: 650 mg via ORAL
  Filled 2020-03-04: qty 2

## 2020-03-04 MED ORDER — DIPHENHYDRAMINE HCL 50 MG/ML IJ SOLN
25.0000 mg | Freq: Once | INTRAMUSCULAR | Status: AC
  Administered 2020-03-04: 25 mg via INTRAVENOUS
  Filled 2020-03-04: qty 1

## 2020-03-04 MED ORDER — PROCHLORPERAZINE EDISYLATE 10 MG/2ML IJ SOLN
10.0000 mg | Freq: Once | INTRAMUSCULAR | Status: AC
  Administered 2020-03-04: 10 mg via INTRAVENOUS
  Filled 2020-03-04: qty 2

## 2020-03-04 NOTE — Discharge Instructions (Signed)
We recommend that you follow-up with neurology for long term management of headaches. Can also follow-up with your primary care doctor in the interim. Return here for new concerns.

## 2020-03-04 NOTE — ED Provider Notes (Signed)
[facility] EMERGENCY DEPARTMENT Provider Note   CSN: [id number] Arrival date & time: 03/03/20  1421     History Chief Complaint  Patient presents with  . Migraine    Victoria [name] is a 64 y.o. female.  The history is provided by the patient and medical records.  Migraine Associated symptoms include headaches.    64 y.o. F with hx of ESRD on HD, GERD, HTN, thyroid disease, presenting to the ED with headache.  Patient seen here 6/5 and 6/7 for same.  She has had full work-up for this including multiple lab studies and CT head along with MRI/MRV which were normal.  She was referred to neurology but has not arranged follow-up.  She reports headache will transiently get better but has never fully resolved.  She states pain is the exam same it has been for 3 weeks.  She describes it as a tightness around her head, some into the right neck. No neck stiffness or difficulty moving her head.  No fever/chills.  She states she was given some pain meds from the ER but has since run out.    Past Medical History:  Diagnosis Date  . Chronic bronchitis ([place])   . Complication of anesthesia ~ 2011   "they gave me a medicine that swolled me and mouth burning up" (08/23/2012)  . Complication of anesthesia    slow to wake up  . ESRD (end stage renal disease) on dialysis [facility]) Nephrologist-- dr deterding   ESRD due to HTN-; "M/W/F; [place]" (11/28/2015  . GERD (gastroesophageal reflux disease)   . History of acute pulmonary edema    2003  . Hypertension    no longer on medications  . Left patella fracture   . Pneumonia    as a child  . Seasonal allergies   . Secondary hyperparathyroidism, renal ([place])    s/p  total parathyroidectomy  2014  . Thyroid disease   . Wears glasses     Patient Active Problem List   Diagnosis Date Noted  . Dehiscence of operative wound 11/28/2015  . Wound dehiscence, surgical 11/28/2015  . Renal dialysis device, implant, or graft  complication 79/39/0300  . Aftercare following surgery of the circulatory system, [place] 03/30/2013  . Mechanical complication of other vascular device, implant, and graft 01/12/2013  . Other and unspecified hyperlipidemia 10/25/2012  . End stage renal disease ([place]) 09/22/2012  . Chest pain, mid sternal 08/23/2012  . Hyperparathyroidism, secondary renal ([place]) 03/22/2011  . Thyroid nodule 03/22/2011  . High blood pressure 03/10/2011  . Kidney disease 03/10/2011    Past Surgical History:  Procedure Laterality Date  . ANKLE FRACTURE SURGERY Right ~ 2005   "has pins in it"  . APPLICATION OF WOUND VAC Left 11/28/2015   knee  . APPLICATION OF WOUND VAC Left 11/28/2015   Procedure: APPLICATION OF WOUND VAC;  Surgeon: Rod Can, MD;  Location: [place];  Service: Orthopedics;  Laterality: Left;  . ARTERIOVENOUS GRAFT PLACEMENT  03-25-2005   Right forearm  w/  multiple Revision's   . CARDIOVASCULAR STRESS TEST  08-24-2012   abnormal nuclear study/  inferolateral and anteroseptal areas of scar,  no ischemia/  normal LV function and wall motion, ef 77%  . DIALYSIS FISTULA CREATION  1992   left upper arm ---  w/  Multiple Revision's until 2006  . ECTOPIC PREGNANCY SURGERY  1970's  . FRACTURE SURGERY    . I & D EXTREMITY Left 11/28/2015   Procedure:  IRRIGATION AND DEBRIDEMENT LEFT KNEE WOUND ;  Surgeon: Rod Can, MD;  Location: [place];  Service: Orthopedics;  Laterality: Left;  . INCISION AND DRAINAGE OF WOUND Left 11/28/2015   knee  . ORIF PATELLA Left 10/31/2015   Procedure: OPEN REDUCTION INTERNAL (ORIF) FIXATION LEFT PATELLA;  Surgeon: Rod Can, MD;  Location: [place];  Service: Orthopedics;  Laterality: Left;  . PATELLAR TENDON REPAIR  10/03/2012   Procedure: PATELLA TENDON REPAIR;  Surgeon: Marin Shutter, MD;  Location: [place];  Service: Orthopedics;  Laterality: Right;  . PATELLECTOMY  10/03/2012   Procedure: PATELLECTOMY;  Surgeon: Marin Shutter, MD;  Location:  [place];  Service: Orthopedics;  Laterality: Right;  RIGHT PARTIAL PATELLECTOMY AND PATELLA TENDON REPAIR  . REVISION OF ARTERIOVENOUS GORETEX GRAFT  09/27/2012   Procedure: REVISION OF ARTERIOVENOUS GORETEX GRAFT;  Surgeon: Mal Misty, MD;  Location: [place];  Service: Vascular;  Laterality: Right;  1) Replacement of venous half of loop with 1mm Gortex graft  2) Excision of erroded pseudoaneurysm of graft with primary closure.  Marland Kitchen REVISION OF ARTERIOVENOUS GORETEX GRAFT Right 01/23/2013   Procedure: REVISION OF ARTERIOVENOUS GORETEX GRAFT;  Surgeon: Conrad Morrow, MD;  Location: [facility] OR;  Service: Vascular;  Laterality: Right;  Using piece of 58mm x 20cm Gortex graft.   Marland Kitchen REVISION OF ARTERIOVENOUS GORETEX GRAFT Right 10/07/2015   Procedure: REVISION OF Right arm ARTERIOVENOUS GORETEX GRAFT;  Surgeon: Elam Dutch, MD;  Location: [facility] OR;  Service: Vascular;  Laterality: Right;  . REVISION OF ARTERIOVENOUS GORETEX GRAFT Right 02/17/2016   Procedure: REVISION OF ARTERIOVENOUS GORETEX GRAFT;  Surgeon: Elam Dutch, MD;  Location: [place];  Service: Vascular;  Laterality: Right;  . TOTAL ABDOMINAL HYSTERECTOMY  03-05-2005   w/  Right Ovarian Cystectomy  . TOTAL PARATHYROIDECTOMY/  THYROID ISTHMUSECTOMY/  AUTOTRANSPLANTATION PARATHYROID TISSUE TO LEFT BRACHIORIADIALIS MUSCLE  02-18-2011  . TRANSTHORACIC ECHOCARDIOGRAM  10-31-2012   mild LVH,  grade 1 diastolic dysfunction,  ef 55-65%/  mild MR/  trivial TR     OB History   No obstetric history on file.     Family History  Problem Relation Age of Onset  . Healthy Mother   . Hypertension Father   . Kidney failure Father   . Hypertension Sister   . Thyroid disease Sister     Social History   Tobacco Use  . Smoking status: Never Smoker  . Smokeless tobacco: Never Used  Vaping Use  . Vaping Use: Never used  Substance Use Topics  . Alcohol use: No    Alcohol/week: 0.0 standard drinks    Comment: quit 2007  . Drug use: No    Home  Medications Prior to Admission medications   Medication Sig Start Date End Date Taking? Authorizing Provider  Acetaminophen (TYLENOL PO) Take 2 tablets by mouth every 6 (six) hours as needed (Pain).    [provider]  calcium acetate (PHOSLO) 667 MG capsule Take 1,334 mg by mouth 3 (three) times daily with meals.     [provider]  fluticasone (FLONASE) 50 MCG/ACT nasal spray Place 2 sprays into both nostrils daily.    [provider]  HYDROcodone-acetaminophen (NORCO/VICODIN) 5-325 MG tablet Take 1 tablet by mouth every 6 (six) hours as needed for severe pain. 02/26/20   Ripley Fraise, MD  ondansetron (ZOFRAN) 4 MG tablet Take 1 tablet (4 mg total) by mouth every 8 (eight) hours as needed for nausea or vomiting. Patient  not taking: Reported on 02/26/2020 03/01/19   Domenic Moras, PA-C    Allergies    Amlodipine, Cefazolin, Cephalosporins, and Lisinopril  Review of Systems   Review of Systems  Neurological: Positive for headaches.  All other systems reviewed and are negative.   Physical Exam Updated Vital Signs BP 123/67 (BP Location: Left Arm)   Pulse 85   Temp 99.2 F (37.3 C) (Oral)   Resp 18   Ht 5\' 3"  (1.6 m)   Wt 77.1 kg   SpO2 96%   BMI 30.11 kg/m   Physical Exam Vitals and nursing note reviewed.  Constitutional:      General: She is not in acute distress.    Appearance: She is well-developed. She is not diaphoretic.     Comments: Crying, non-toxic in appearance  HENT:     Head: Normocephalic and atraumatic.     Right Ear: External ear normal.     Left Ear: External ear normal.     Mouth/Throat:     Comments: Poor dentition Eyes:     Conjunctiva/sclera: Conjunctivae normal.     Pupils: Pupils are equal, round, and reactive to light.  Neck:     Comments: No rigidity, no meningismus Cardiovascular:     Rate and Rhythm: Normal rate and regular rhythm.     Heart sounds: Normal heart sounds. No murmur heard.   Pulmonary:     Effort:  Pulmonary effort is normal. No respiratory distress.     Breath sounds: Normal breath sounds. No wheezing or rhonchi.  Abdominal:     General: Bowel sounds are normal.     Palpations: Abdomen is soft.     Tenderness: There is no abdominal tenderness. There is no guarding.  Musculoskeletal:        General: Normal range of motion.     Cervical back: Full passive range of motion without pain, normal range of motion and neck supple. No rigidity.  Skin:    General: Skin is warm and dry.     Findings: No rash.  Neurological:     Mental Status: She is alert and oriented to person, place, and time.     Cranial Nerves: No cranial nerve deficit.     Sensory: No sensory deficit.     Motor: No tremor or seizure activity.     Comments: AAOx3, answering questions and following commands appropriately; equal strength UE and LE bilaterally; CN grossly intact; moves all extremities appropriately without ataxia; no focal neuro deficits or facial asymmetry appreciated  Psychiatric:        Behavior: Behavior normal.        Thought Content: Thought content normal.     ED Results / Procedures / Treatments   Labs (all labs ordered are listed, but only abnormal results are displayed) Labs Reviewed  CBC - Abnormal; Notable for the following components:      Result Value   MCV 79.1 (*)    MCH 25.4 (*)    All other components within normal limits  BASIC METABOLIC PANEL - Abnormal; Notable for the following components:   Chloride 92 (*)    Creatinine, Ser 6.41 (*)    GFR calc non Af Amer 6 (*)    GFR calc Af Amer 7 (*)    All other components within normal limits    EKG None  Radiology No results found.  Procedures Procedures (including critical care time)  Medications Ordered in ED Medications  prochlorperazine (COMPAZINE) injection 10 mg (10 mg  Intravenous Given 03/04/20 0201)  diphenhydrAMINE (BENADRYL) injection 25 mg (25 mg Intravenous Given 03/04/20 0158)  acetaminophen (TYLENOL) tablet  650 mg (650 mg Oral Given 03/04/20 0203)    ED Course  I have reviewed the triage vital signs and the nursing notes.  Pertinent labs & imaging results that were available during my care of the patient were reviewed by me and considered in my medical decision making (see chart for details).    MDM Rules/Calculators/A&P  64 year old female presenting to the ED with headache.  She has been seen in the ED for same previously with negative work-up including multiple lab studies, CT, MRI/MRV which have all been reassuring.  Denies any change in headache, just remains persistent.  She reports some transient improvement with home pain meds, however has since run out.  She has not yet followed up with neurology.  By time of evaluation, patient waiting 10 hours or so in the lobby.  She remains awake, alert, appropriately oriented.  Her neurologic exam is nonfocal.  She has no nuchal rigidity or other clinical signs suggestive of meningitis.  Given prolonged course, low suspicion for acute intracranial pathology.  She describes pain as a tightness around her head extending into the neck, suspect there is component of tension headache.  She has had good response to migraine cocktail in the past so we will give dose of Compazine and Benadryl along with Tylenol.  Will reassess.  Patient reports headache has improved after migraine cocktail.  She has been resting for the past hour or so.  She feels ready to go.  I have encouraged her to follow-up with neurology as initially recommended.  Can follow-up with PCP in the interim.  Return here for any new/acute changes.  Final Clinical Impression(s) / ED Diagnoses Final diagnoses:  Bad headache    Rx / DC Orders ED Discharge Orders    None       Larene Pickett, PA-C 03/04/20 0442    Orpah Greek, MD 03/04/20 [phone]

## 2020-03-04 NOTE — ED Notes (Signed)
All discharge instructions reviewed with pt. Pt verbalizes understanding. Discharged without issue.

## 2020-03-11 ENCOUNTER — Ambulatory Visit (HOSPITAL_COMMUNITY)
Admission: EM | Admit: 2020-03-11 | Discharge: 2020-03-11 | Disposition: A | Discharge status: Home / Self Care | Payer: Medicare Other | Attending: Family Medicine | Admitting: Family Medicine

## 2020-03-11 ENCOUNTER — Encounter (HOSPITAL_COMMUNITY): Payer: Self-pay

## 2020-03-11 ENCOUNTER — Other Ambulatory Visit: Payer: Self-pay

## 2020-03-11 DIAGNOSIS — M791 Myalgia, unspecified site: Secondary | ICD-10-CM | POA: Diagnosis not present

## 2020-03-11 MED ORDER — METHOCARBAMOL 500 MG PO TABS
500.0000 mg | ORAL_TABLET | Freq: Two times a day (BID) | ORAL | 0 refills | Status: DC
Start: 2020-03-11 — End: 2020-03-11

## 2020-03-11 NOTE — ED Triage Notes (Signed)
Pt presents today with bilateral lower extremity joint pain x1 week. Pt denies history of arthritis. Pt states pain is worse when she wakes up. Pt states pain goes from knees, to hips to back. Pt states she has been seen for same, and given "shot" and wants the same today. Pt states she has been taking tylenol and ibuprofen at home with out relief. PT ambulated into treatment room with out assistance. Pt has full range of motion in LEs'

## 2020-03-11 NOTE — Discharge Instructions (Addendum)
Your pain is most likely due to muscle soreness from increased walking and exercise Recommended strengthening the muscles and doing stretching. Tylenol as needed  You can do heat to the muscles Follow up as needed for continued or worsening symptoms

## 2020-03-11 NOTE — ED Provider Notes (Signed)
[place]    CSN: [id number] Arrival date & time: 03/11/20  0913      History   Chief Complaint Chief Complaint  Patient presents with  . Joint Pain    HPI Victoria [name] is a 64 y.o. female.   Pt reports experiencing generalized muscle pain intermittently x 1 month. Reports beginning to walk more in the last couple of months, previously more sedentary. She complains of generalized muscle tightness and soreness in back and lower extremities, denies performing stretching or strengthening exercises. No alleviating measures taken. Pain aggravated by ambulating and upon waking up in the morning. Denies weakness, fevers, chills, numbness/tingling, alterations in sensation in extremities.   ROS per HPI      Past Medical History:  Diagnosis Date  . Chronic bronchitis ([place])   . Complication of anesthesia ~ 2011   "they gave me a medicine that swolled me and mouth burning up" (08/23/2012)  . Complication of anesthesia    slow to wake up  . ESRD (end stage renal disease) on dialysis [facility]) Nephrologist-- dr deterding   ESRD due to HTN-; "M/W/F; [place]" (11/28/2015  . GERD (gastroesophageal reflux disease)   . History of acute pulmonary edema    2003  . Hypertension    no longer on medications  . Left patella fracture   . Pneumonia    as a child  . Seasonal allergies   . Secondary hyperparathyroidism, renal ([place])    s/p  total parathyroidectomy  2014  . Thyroid disease   . Wears glasses     Patient Active Problem List   Diagnosis Date Noted  . Dehiscence of operative wound 11/28/2015  . Wound dehiscence, surgical 11/28/2015  . Renal dialysis device, implant, or graft complication 00/86/7619  . Aftercare following surgery of the circulatory system, [place] 03/30/2013  . Mechanical complication of other vascular device, implant, and graft 01/12/2013  . Other and unspecified hyperlipidemia 10/25/2012  . End stage renal disease ([place]) 09/22/2012  . Chest pain,  mid sternal 08/23/2012  . Hyperparathyroidism, secondary renal ([place]) 03/22/2011  . Thyroid nodule 03/22/2011  . High blood pressure 03/10/2011  . Kidney disease 03/10/2011    Past Surgical History:  Procedure Laterality Date  . ANKLE FRACTURE SURGERY Right ~ 2005   "has pins in it"  . APPLICATION OF WOUND VAC Left 11/28/2015   knee  . APPLICATION OF WOUND VAC Left 11/28/2015   Procedure: APPLICATION OF WOUND VAC;  Surgeon: Rod Can, MD;  Location: [place];  Service: Orthopedics;  Laterality: Left;  . ARTERIOVENOUS GRAFT PLACEMENT  03-25-2005   Right forearm  w/  multiple Revision's   . CARDIOVASCULAR STRESS TEST  08-24-2012   abnormal nuclear study/  inferolateral and anteroseptal areas of scar,  no ischemia/  normal LV function and wall motion, ef 77%  . DIALYSIS FISTULA CREATION  1992   left upper arm ---  w/  Multiple Revision's until 2006  . ECTOPIC PREGNANCY SURGERY  1970's  . FRACTURE SURGERY    . I & D EXTREMITY Left 11/28/2015   Procedure: IRRIGATION AND DEBRIDEMENT LEFT KNEE WOUND ;  Surgeon: Rod Can, MD;  Location: [place];  Service: Orthopedics;  Laterality: Left;  . INCISION AND DRAINAGE OF WOUND Left 11/28/2015   knee  . ORIF PATELLA Left 10/31/2015   Procedure: OPEN REDUCTION INTERNAL (ORIF) FIXATION LEFT PATELLA;  Surgeon: Rod Can, MD;  Location: [place];  Service: Orthopedics;  Laterality: Left;  . PATELLAR  TENDON REPAIR  10/03/2012   Procedure: PATELLA TENDON REPAIR;  Surgeon: Marin Shutter, MD;  Location: [place];  Service: Orthopedics;  Laterality: Right;  . PATELLECTOMY  10/03/2012   Procedure: PATELLECTOMY;  Surgeon: Marin Shutter, MD;  Location: [place];  Service: Orthopedics;  Laterality: Right;  RIGHT PARTIAL PATELLECTOMY AND PATELLA TENDON REPAIR  . REVISION OF ARTERIOVENOUS GORETEX GRAFT  09/27/2012   Procedure: REVISION OF ARTERIOVENOUS GORETEX GRAFT;  Surgeon: Mal Misty, MD;  Location: [place];  Service: Vascular;  Laterality:  Right;  1) Replacement of venous half of loop with 17mm Gortex graft  2) Excision of erroded pseudoaneurysm of graft with primary closure.  Marland Kitchen REVISION OF ARTERIOVENOUS GORETEX GRAFT Right 01/23/2013   Procedure: REVISION OF ARTERIOVENOUS GORETEX GRAFT;  Surgeon: Conrad [place], MD;  Location: [facility] OR;  Service: Vascular;  Laterality: Right;  Using piece of 51mm x 20cm Gortex graft.   Marland Kitchen REVISION OF ARTERIOVENOUS GORETEX GRAFT Right 10/07/2015   Procedure: REVISION OF Right arm ARTERIOVENOUS GORETEX GRAFT;  Surgeon: Elam Dutch, MD;  Location: [facility] OR;  Service: Vascular;  Laterality: Right;  . REVISION OF ARTERIOVENOUS GORETEX GRAFT Right 02/17/2016   Procedure: REVISION OF ARTERIOVENOUS GORETEX GRAFT;  Surgeon: Elam Dutch, MD;  Location: [place];  Service: Vascular;  Laterality: Right;  . TOTAL ABDOMINAL HYSTERECTOMY  03-05-2005   w/  Right Ovarian Cystectomy  . TOTAL PARATHYROIDECTOMY/  THYROID ISTHMUSECTOMY/  AUTOTRANSPLANTATION PARATHYROID TISSUE TO LEFT BRACHIORIADIALIS MUSCLE  02-18-2011  . TRANSTHORACIC ECHOCARDIOGRAM  10-31-2012   mild LVH,  grade 1 diastolic dysfunction,  ef 55-65%/  mild MR/  trivial TR    OB History   No obstetric history on file.      Home Medications    Prior to Admission medications   Medication Sig Start Date End Date Taking? Authorizing Provider  Acetaminophen (TYLENOL PO) Take 2 tablets by mouth every 6 (six) hours as needed (Pain).   Yes [provider]  calcium acetate (PHOSLO) 667 MG capsule Take 1,334 mg by mouth 3 (three) times daily with meals.    Yes [provider]  fluticasone (FLONASE) 50 MCG/ACT nasal spray Place 2 sprays into both nostrils daily.   Yes [provider]  ondansetron (ZOFRAN) 4 MG tablet Take 1 tablet (4 mg total) by mouth every 8 (eight) hours as needed for nausea or vomiting. Patient not taking: Reported on 02/26/2020 03/01/19   Domenic Moras, PA-C    Family History Family History  Problem Relation Age of  Onset  . Healthy Mother   . Hypertension Father   . Kidney failure Father   . Hypertension Sister   . Thyroid disease Sister     Social History Social History   Tobacco Use  . Smoking status: Never Smoker  . Smokeless tobacco: Never Used  Vaping Use  . Vaping Use: Never used  Substance Use Topics  . Alcohol use: No    Alcohol/week: 0.0 standard drinks    Comment: quit 2007  . Drug use: No     Allergies   Amlodipine, Cefazolin, Cephalosporins, and Lisinopril   Review of Systems Review of Systems  Constitutional: Positive for activity change.       Increased walking over last couple of months  Respiratory: Negative.   Cardiovascular: Negative for chest pain and leg swelling.  Musculoskeletal: Positive for back pain.  Skin: Negative.   Neurological: Negative.      Physical Exam Triage Vital Signs ED Triage Vitals  Enc Vitals Group     BP 03/11/20 0926 (!) 146/91     Pulse Rate 03/11/20 0926 98     Resp 03/11/20 0926 16     Temp 03/11/20 0926 98.4 F (36.9 C)     Temp Source 03/11/20 0926 Oral     SpO2 03/11/20 0926 98 %     Weight --      Height --      Head Circumference --      Peak Flow --      Pain Score 03/11/20 0929 9     Pain Loc --      Pain Edu? --      Excl. in [place]? --    No data found.  Updated Vital Signs BP (!) 146/91 (BP Location: Left Arm)   Pulse 98   Temp 98.4 F (36.9 C) (Oral)   Resp 16   SpO2 98%   Visual Acuity Right Eye Distance:   Left Eye Distance:   Bilateral Distance:    Right Eye Near:   Left Eye Near:    Bilateral Near:     Physical Exam Constitutional:      General: She is not in acute distress.    Appearance: Normal appearance. She is obese.  HENT:     Head: Normocephalic.  Pulmonary:     Effort: Pulmonary effort is normal.  Musculoskeletal:        General: Normal range of motion.     Right shoulder: Normal. No tenderness.     Left shoulder: Normal. No tenderness.     Cervical back: Normal and normal  range of motion. No signs of trauma, tenderness or crepitus. No pain with movement.     Thoracic back: Normal. No tenderness.     Lumbar back: Normal. No tenderness.     Right upper leg: Normal. No deformity.     Left upper leg: Normal. No deformity.     Right knee: Normal. No tenderness.     Left knee: Normal. No tenderness.     Right lower leg: Normal.     Left lower leg: Normal.     Comments: Generalized muscle pain/tightness in back, shoulders, lower extremities  Skin:    General: Skin is warm and dry.  Neurological:     General: No focal deficit present.     Mental Status: She is alert.     Sensory: Sensation is intact.     Motor: Motor function is intact.     Coordination: Coordination is intact.     Gait: Gait is intact.  Psychiatric:        Mood and Affect: Mood normal.        Behavior: Behavior normal.      UC Treatments / Results  Labs (all labs ordered are listed, but only abnormal results are displayed) Labs Reviewed - No data to display  EKG   Radiology No results found.  Procedures Procedures (including critical care time)  Medications Ordered in UC Medications - No data to display  Initial Impression / Assessment and Plan / UC Course  I have reviewed the triage vital signs and the nursing notes.  Pertinent labs & imaging results that were available during my care of the patient were reviewed by me and considered in my medical decision making (see chart for details).     Muscle soreness Most likely from increased walking  Patient on dialysis. We will not prescribe any medications at this time.  We recommended  resting, stretching, strengthening exercises for the muscles.  She can take Tylenol as needed. Heat to the area Follow up as needed for continued or worsening symptoms  Final Clinical Impressions(s) / UC Diagnoses   Final diagnoses:  Muscle soreness     Discharge Instructions     Your pain is most likely due to muscle soreness from  increased walking and exercise Recommended strengthening the muscles and doing stretching. Tylenol as needed  You can do heat to the muscles Follow up as needed for continued or worsening symptoms     ED Prescriptions    Medication Sig Dispense Auth. Provider   methocarbamol (ROBAXIN) 500 MG tablet  (Status: Discontinued) Take 1 tablet (500 mg total) by mouth 2 (two) times daily. 20 tablet Loura Halt A, NP     PDMP not reviewed this encounter.   Loura Halt A, NP 03/12/20 [phone]

## 2020-03-19 ENCOUNTER — Emergency Department (HOSPITAL_COMMUNITY): Payer: Medicare Other

## 2020-03-19 ENCOUNTER — Other Ambulatory Visit: Payer: Self-pay

## 2020-03-19 ENCOUNTER — Encounter (HOSPITAL_COMMUNITY): Payer: Self-pay | Admitting: Pediatrics

## 2020-03-19 ENCOUNTER — Emergency Department (HOSPITAL_COMMUNITY)
Admission: EM | Admit: 2020-03-19 | Discharge: 2020-03-19 | Disposition: A | Discharge status: Home / Self Care | Payer: Medicare Other | Attending: Emergency Medicine | Admitting: Emergency Medicine

## 2020-03-19 DIAGNOSIS — N186 End stage renal disease: Secondary | ICD-10-CM | POA: Insufficient documentation

## 2020-03-19 DIAGNOSIS — R0602 Shortness of breath: Secondary | ICD-10-CM | POA: Insufficient documentation

## 2020-03-19 DIAGNOSIS — R600 Localized edema: Secondary | ICD-10-CM | POA: Diagnosis not present

## 2020-03-19 DIAGNOSIS — I120 Hypertensive chronic kidney disease with stage 5 chronic kidney disease or end stage renal disease: Secondary | ICD-10-CM | POA: Insufficient documentation

## 2020-03-19 DIAGNOSIS — R519 Headache, unspecified: Secondary | ICD-10-CM | POA: Diagnosis not present

## 2020-03-19 DIAGNOSIS — R0789 Other chest pain: Secondary | ICD-10-CM | POA: Insufficient documentation

## 2020-03-19 DIAGNOSIS — Z992 Dependence on renal dialysis: Secondary | ICD-10-CM | POA: Insufficient documentation

## 2020-03-19 LAB — CBC
HCT: 37.9 % (ref 36.0–46.0)
Hemoglobin: 11.9 g/dL — ABNORMAL LOW (ref 12.0–15.0)
MCH: 25.2 pg — ABNORMAL LOW (ref 26.0–34.0)
MCHC: 31.4 g/dL (ref 30.0–36.0)
MCV: 80.1 fL (ref 80.0–100.0)
Platelets: 355 10*3/uL (ref 150–400)
RBC: 4.73 MIL/uL (ref 3.87–5.11)
RDW: 15.5 % (ref 11.5–15.5)
WBC: 8.5 10*3/uL (ref 4.0–10.5)
nRBC: 0 % (ref 0.0–0.2)

## 2020-03-19 LAB — BASIC METABOLIC PANEL
Anion gap: 12 (ref 5–15)
BUN: 10 mg/dL (ref 8–23)
CO2: 31 mmol/L (ref 22–32)
Calcium: 8.3 mg/dL — ABNORMAL LOW (ref 8.9–10.3)
Chloride: 95 mmol/L — ABNORMAL LOW (ref 98–111)
Creatinine, Ser: 4.58 mg/dL — ABNORMAL HIGH (ref 0.44–1.00)
GFR calc Af Amer: 11 mL/min — ABNORMAL LOW (ref >60)
GFR calc non Af Amer: 9 mL/min — ABNORMAL LOW (ref >60)
Glucose, Bld: 103 mg/dL — ABNORMAL HIGH (ref 70–99)
Potassium: 3.1 mmol/L — ABNORMAL LOW (ref 3.5–5.1)
Sodium: 138 mmol/L (ref 135–145)

## 2020-03-19 LAB — BRAIN NATRIURETIC PEPTIDE: B Natriuretic Peptide: 86.4 pg/mL (ref 0.0–100.0)

## 2020-03-19 LAB — TROPONIN I (HIGH SENSITIVITY)
Troponin I (High Sensitivity): 38 ng/L — ABNORMAL HIGH (ref <18)
Troponin I (High Sensitivity): 41 ng/L — ABNORMAL HIGH (ref <18)

## 2020-03-19 MED ORDER — KETOROLAC TROMETHAMINE 30 MG/ML IJ SOLN
10.0000 mg | Freq: Once | INTRAMUSCULAR | Status: AC
  Administered 2020-03-19: 9.9 mg via INTRAVENOUS
  Filled 2020-03-19: qty 1

## 2020-03-19 MED ORDER — SODIUM CHLORIDE 0.9% FLUSH: 3.0000 mL | Freq: Once | INTRAVENOUS | Status: DC

## 2020-03-19 NOTE — ED Triage Notes (Signed)
Patient here from dialysis center after her full treatment HD today d/t worsening shortness of breath and chest pain.

## 2020-03-19 NOTE — Discharge Instructions (Addendum)
Please follow-up closely with your primary care provider regarding your visit today. You can discuss your blood pressure concerns with your primary care, as well as your frequent headaches.  You were provided with neurology referral during previous ED visits.  We recommend you follow-up for your recurrent headaches.

## 2020-03-19 NOTE — ED Provider Notes (Signed)
[facility] EMERGENCY DEPARTMENT Provider Note   CSN: [id number] Arrival date & time: 03/19/20  1114     History Chief Complaint  Patient presents with  . Chest Pain  . Shortness of Breath    Victoria [name] is a 64 y.o. female w PMHx ESRD on HD MWF, chronic bronchitis, secondary hyperparathyroidism, presenting to the ED with complaint of shortness of breath and HTN. SOB began yesterday. Pt states she noticed her legs are swollen, as well as her neck and around her eyes. She states she feels SOB, has been going on for 3 weeks now with cough. She reports a brief episode of chest pain that was mild in severity that occurred during her dialysis today. It has resolved. She was able to complete her dialysis, states her BP was as high at 178/80 when she finished dialysis.  She states her blood pressure is very high for her and thinks she may need BP medication. She thinks this has been causing her headaches, for which she has been seen in the ED 3 times this month with neg CT head, neg MRI/MRV brain. Has completed COVID vaccines in March. No fevers. Per chart review, last ECHO done in 2018, with EF 55-60%.  The history is provided by medical records and the patient.       Past Medical History:  Diagnosis Date  . Chronic bronchitis ([place])   . Complication of anesthesia ~ 2011   "they gave me a medicine that swolled me and mouth burning up" (08/23/2012)  . Complication of anesthesia    slow to wake up  . ESRD (end stage renal disease) on dialysis [facility]) Nephrologist-- dr deterding   ESRD due to HTN-; "M/W/F; [place]" (11/28/2015  . GERD (gastroesophageal reflux disease)   . History of acute pulmonary edema    2003  . Hypertension    no longer on medications  . Left patella fracture   . Pneumonia    as a child  . Seasonal allergies   . Secondary hyperparathyroidism, renal ([place])    s/p  total parathyroidectomy  2014  . Thyroid disease   . Wears glasses      Patient Active Problem List   Diagnosis Date Noted  . Dehiscence of operative wound 11/28/2015  . Wound dehiscence, surgical 11/28/2015  . Renal dialysis device, implant, or graft complication 25/42/7062  . Aftercare following surgery of the circulatory system, [place] 03/30/2013  . Mechanical complication of other vascular device, implant, and graft 01/12/2013  . Other and unspecified hyperlipidemia 10/25/2012  . End stage renal disease ([place]) 09/22/2012  . Chest pain, mid sternal 08/23/2012  . Hyperparathyroidism, secondary renal ([place]) 03/22/2011  . Thyroid nodule 03/22/2011  . High blood pressure 03/10/2011  . Kidney disease 03/10/2011    Past Surgical History:  Procedure Laterality Date  . ANKLE FRACTURE SURGERY Right ~ 2005   "has pins in it"  . APPLICATION OF WOUND VAC Left 11/28/2015   knee  . APPLICATION OF WOUND VAC Left 11/28/2015   Procedure: APPLICATION OF WOUND VAC;  Surgeon: Rod Can, MD;  Location: [place];  Service: Orthopedics;  Laterality: Left;  . ARTERIOVENOUS GRAFT PLACEMENT  03-25-2005   Right forearm  w/  multiple Revision's   . CARDIOVASCULAR STRESS TEST  08-24-2012   abnormal nuclear study/  inferolateral and anteroseptal areas of scar,  no ischemia/  normal LV function and wall motion, ef 77%  . DIALYSIS FISTULA CREATION  1992   left upper  arm ---  w/  Multiple Revision's until 2006  . ECTOPIC PREGNANCY SURGERY  1970's  . FRACTURE SURGERY    . I & D EXTREMITY Left 11/28/2015   Procedure: IRRIGATION AND DEBRIDEMENT LEFT KNEE WOUND ;  Surgeon: Rod Can, MD;  Location: [place];  Service: Orthopedics;  Laterality: Left;  . INCISION AND DRAINAGE OF WOUND Left 11/28/2015   knee  . ORIF PATELLA Left 10/31/2015   Procedure: OPEN REDUCTION INTERNAL (ORIF) FIXATION LEFT PATELLA;  Surgeon: Rod Can, MD;  Location: [place];  Service: Orthopedics;  Laterality: Left;  . PATELLAR TENDON REPAIR  10/03/2012   Procedure: PATELLA TENDON  REPAIR;  Surgeon: Marin Shutter, MD;  Location: [place];  Service: Orthopedics;  Laterality: Right;  . PATELLECTOMY  10/03/2012   Procedure: PATELLECTOMY;  Surgeon: Marin Shutter, MD;  Location: [place];  Service: Orthopedics;  Laterality: Right;  RIGHT PARTIAL PATELLECTOMY AND PATELLA TENDON REPAIR  . REVISION OF ARTERIOVENOUS GORETEX GRAFT  09/27/2012   Procedure: REVISION OF ARTERIOVENOUS GORETEX GRAFT;  Surgeon: Mal Misty, MD;  Location: [place];  Service: Vascular;  Laterality: Right;  1) Replacement of venous half of loop with 22mm Gortex graft  2) Excision of erroded pseudoaneurysm of graft with primary closure.  Marland Kitchen REVISION OF ARTERIOVENOUS GORETEX GRAFT Right 01/23/2013   Procedure: REVISION OF ARTERIOVENOUS GORETEX GRAFT;  Surgeon: Conrad [place], MD;  Location: [facility] OR;  Service: Vascular;  Laterality: Right;  Using piece of 62mm x 20cm Gortex graft.   Marland Kitchen REVISION OF ARTERIOVENOUS GORETEX GRAFT Right 10/07/2015   Procedure: REVISION OF Right arm ARTERIOVENOUS GORETEX GRAFT;  Surgeon: Elam Dutch, MD;  Location: [facility] OR;  Service: Vascular;  Laterality: Right;  . REVISION OF ARTERIOVENOUS GORETEX GRAFT Right 02/17/2016   Procedure: REVISION OF ARTERIOVENOUS GORETEX GRAFT;  Surgeon: Elam Dutch, MD;  Location: [place];  Service: Vascular;  Laterality: Right;  . TOTAL ABDOMINAL HYSTERECTOMY  03-05-2005   w/  Right Ovarian Cystectomy  . TOTAL PARATHYROIDECTOMY/  THYROID ISTHMUSECTOMY/  AUTOTRANSPLANTATION PARATHYROID TISSUE TO LEFT BRACHIORIADIALIS MUSCLE  02-18-2011  . TRANSTHORACIC ECHOCARDIOGRAM  10-31-2012   mild LVH,  grade 1 diastolic dysfunction,  ef 55-65%/  mild MR/  trivial TR     OB History   No obstetric history on file.     Family History  Problem Relation Age of Onset  . Healthy Mother   . Hypertension Father   . Kidney failure Father   . Hypertension Sister   . Thyroid disease Sister     Social History   Tobacco Use  . Smoking status: Never Smoker  . Smokeless  tobacco: Never Used  Vaping Use  . Vaping Use: Never used  Substance Use Topics  . Alcohol use: No    Alcohol/week: 0.0 standard drinks    Comment: quit 2007  . Drug use: No    Home Medications Prior to Admission medications   Medication Sig Start Date End Date Taking? Authorizing Provider  fluticasone (FLONASE) 50 MCG/ACT nasal spray Place 2 sprays into both nostrils daily.   Yes [provider]  methocarbamol (ROBAXIN) 500 MG tablet Take 500 mg by mouth 2 (two) times daily. 03/11/20  Yes [provider]  ondansetron (ZOFRAN) 4 MG tablet Take 1 tablet (4 mg total) by mouth every 8 (eight) hours as needed for nausea or vomiting. Patient not taking: Reported on 02/26/2020 03/01/19   Domenic Moras, PA-C    Allergies    Amlodipine,  Cefazolin, Cephalosporins, and Lisinopril  Review of Systems   Review of Systems  All other systems reviewed and are negative.   Physical Exam Updated Vital Signs BP 130/81 (BP Location: Left Arm)   Pulse 84   Temp 98.6 F (37 C) (Oral)   Resp 18   Ht 5\' 3"  (1.6 m)   Wt 84.8 kg   SpO2 99%   BMI 33.13 kg/m   Physical Exam Vitals and nursing note reviewed.  Constitutional:      General: She is not in acute distress.    Appearance: She is well-developed.  HENT:     Head: Normocephalic and atraumatic.  Eyes:     Conjunctiva/sclera: Conjunctivae normal.  Cardiovascular:     Rate and Rhythm: Normal rate and regular rhythm.  Pulmonary:     Effort: Pulmonary effort is normal. No respiratory distress.     Breath sounds: Normal breath sounds.  Abdominal:     General: Bowel sounds are normal.     Palpations: Abdomen is soft.     Tenderness: There is no abdominal tenderness.  Musculoskeletal:     Comments: 1-2+ pitting edema, pretibial and pedal, bilateral. No redness or warmth  Skin:    General: Skin is warm.  Neurological:     Mental Status: She is alert.  Psychiatric:        Behavior: Behavior normal.     ED Results /  Procedures / Treatments   Labs (all labs ordered are listed, but only abnormal results are displayed) Labs Reviewed  BASIC METABOLIC PANEL - Abnormal; Notable for the following components:      Result Value   Potassium 3.1 (*)    Chloride 95 (*)    Glucose, Bld 103 (*)    Creatinine, Ser 4.58 (*)    Calcium 8.3 (*)    GFR calc non Af Amer 9 (*)    GFR calc Af Amer 11 (*)    All other components within normal limits  CBC - Abnormal; Notable for the following components:   Hemoglobin 11.9 (*)    MCH 25.2 (*)    All other components within normal limits  TROPONIN I (HIGH SENSITIVITY) - Abnormal; Notable for the following components:   Troponin I (High Sensitivity) 38 (*)    All other components within normal limits  TROPONIN I (HIGH SENSITIVITY) - Abnormal; Notable for the following components:   Troponin I (High Sensitivity) 41 (*)    All other components within normal limits  BRAIN NATRIURETIC PEPTIDE    EKG EKG Interpretation  Date/Time:  Wednesday March 19 2020 11:22:37 EDT Ventricular Rate:  97 PR Interval:  192 QRS Duration: 92 QT Interval:  404 QTC Calculation: 513 R Axis:   -54 Text Interpretation: Normal sinus rhythm Low voltage QRS Left anterior fascicular block Possible Lateral infarct , age undetermined Prolonged QT Abnormal ECG Confirmed by Quintella Reichert [phone]) on 03/19/2020 5:11:36 PM   Radiology DG Chest 2 View  Result Date: 03/19/2020 CLINICAL DATA:  Shortness of breath. Additional history provided: Shortness of breath for 1 week. EXAM: CHEST - 2 VIEW COMPARISON:  Prior chest radiograph 02/25/2020 FINDINGS: Mild cardiomegaly, unchanged. Chronic elevation of the left hemidiaphragm with associated left basilar atelectasis. There is no appreciable airspace consolidation or frank pulmonary edema. No sizable pleural effusion or evidence of pneumothorax. No acute bony abnormality identified IMPRESSION: No evidence of acute cardiopulmonary abnormality. Redemonstrated  chronic elevation of the left hemidiaphragm with associated left basilar atelectasis. Mild cardiomegaly. Aortic Atherosclerosis (ICD10-I70.0).  Electronically Signed   By: Kellie Simmering DO   On: 03/19/2020 11:47    Procedures Procedures (including critical care time)  Medications Ordered in ED Medications  sodium chloride flush (NS) 0.9 % injection 3 mL (has no administration in time range)  ketorolac (TORADOL) 30 MG/ML injection 9.9 mg (has no administration in time range)    ED Course  I have reviewed the triage vital signs and the nursing notes.  Pertinent labs & imaging results that were available during my care of the patient were reviewed by me and considered in my medical decision making (see chart for details).    MDM Rules/Calculators/A&P                          Patient presenting to the ED with concern for hypertension and shortness of breath.  She states she finished hemodialysis today and her blood pressure was quite high.  She states it is still high in the ED though it is noted to be 673A to 193 systolic over 80.  Lungs are clear bilaterally with good air movement.  She has normal respiratory rate and effort.  O2 saturation is excellent at 99% on room air.  Chest x-ray without infiltrate or edema.  Labs today are also reassuring.  EKG is nonischemic.  Troponin appears to be at her baseline and is not changing on recheck.  BNP is also within normal limits.  Metabolic panel and CBC are unremarkable.  Her vital signs remained stable throughout stay.  Low suspicion for ACS or other acute cause of patient's symptoms.  Recommend she follow-up outpatient.  Provided reassurance regarding blood pressure.  Patient safe for discharge.  Patient discussed with and evaluated by Dr. Ralene Bathe.  Final Clinical Impression(s) / ED Diagnoses Final diagnoses:  Bad headache  Shortness of breath    Rx / DC Orders ED Discharge Orders    None       Dakai Braithwaite, [place] N, PA-C 03/19/20 2010     Quintella Reichert, MD 03/21/20 2258

## 2020-03-20 DIAGNOSIS — Z992 Dependence on renal dialysis: Secondary | ICD-10-CM | POA: Diagnosis not present

## 2020-03-20 DIAGNOSIS — I120 Hypertensive chronic kidney disease with stage 5 chronic kidney disease or end stage renal disease: Secondary | ICD-10-CM | POA: Diagnosis not present

## 2020-03-20 DIAGNOSIS — N186 End stage renal disease: Secondary | ICD-10-CM | POA: Diagnosis not present

## 2020-03-21 DIAGNOSIS — D509 Iron deficiency anemia, unspecified: Secondary | ICD-10-CM | POA: Diagnosis not present

## 2020-03-21 DIAGNOSIS — N2581 Secondary hyperparathyroidism of renal origin: Secondary | ICD-10-CM | POA: Diagnosis not present

## 2020-03-21 DIAGNOSIS — D631 Anemia in chronic kidney disease: Secondary | ICD-10-CM | POA: Diagnosis not present

## 2020-03-21 DIAGNOSIS — Z992 Dependence on renal dialysis: Secondary | ICD-10-CM | POA: Diagnosis not present

## 2020-03-21 DIAGNOSIS — N186 End stage renal disease: Secondary | ICD-10-CM | POA: Diagnosis not present

## 2020-03-24 DIAGNOSIS — N186 End stage renal disease: Secondary | ICD-10-CM | POA: Diagnosis not present

## 2020-03-24 DIAGNOSIS — N2581 Secondary hyperparathyroidism of renal origin: Secondary | ICD-10-CM | POA: Diagnosis not present

## 2020-03-24 DIAGNOSIS — Z992 Dependence on renal dialysis: Secondary | ICD-10-CM | POA: Diagnosis not present

## 2020-03-24 DIAGNOSIS — D631 Anemia in chronic kidney disease: Secondary | ICD-10-CM | POA: Diagnosis not present

## 2020-03-24 DIAGNOSIS — D509 Iron deficiency anemia, unspecified: Secondary | ICD-10-CM | POA: Diagnosis not present

## 2020-03-26 DIAGNOSIS — Z992 Dependence on renal dialysis: Secondary | ICD-10-CM | POA: Diagnosis not present

## 2020-03-26 DIAGNOSIS — D509 Iron deficiency anemia, unspecified: Secondary | ICD-10-CM | POA: Diagnosis not present

## 2020-03-26 DIAGNOSIS — N2581 Secondary hyperparathyroidism of renal origin: Secondary | ICD-10-CM | POA: Diagnosis not present

## 2020-03-26 DIAGNOSIS — D631 Anemia in chronic kidney disease: Secondary | ICD-10-CM | POA: Diagnosis not present

## 2020-03-26 DIAGNOSIS — N186 End stage renal disease: Secondary | ICD-10-CM | POA: Diagnosis not present

## 2020-03-28 ENCOUNTER — Emergency Department (HOSPITAL_COMMUNITY)
Admission: EM | Admit: 2020-03-28 | Discharge: 2020-03-28 | Disposition: A | Discharge status: Home / Self Care | Payer: Medicare Other | Attending: Emergency Medicine | Admitting: Emergency Medicine

## 2020-03-28 ENCOUNTER — Emergency Department (HOSPITAL_COMMUNITY): Payer: Medicare Other

## 2020-03-28 ENCOUNTER — Encounter (HOSPITAL_COMMUNITY): Payer: Self-pay

## 2020-03-28 ENCOUNTER — Other Ambulatory Visit: Payer: Self-pay

## 2020-03-28 DIAGNOSIS — Z7901 Long term (current) use of anticoagulants: Secondary | ICD-10-CM | POA: Insufficient documentation

## 2020-03-28 DIAGNOSIS — I120 Hypertensive chronic kidney disease with stage 5 chronic kidney disease or end stage renal disease: Secondary | ICD-10-CM | POA: Insufficient documentation

## 2020-03-28 DIAGNOSIS — N2581 Secondary hyperparathyroidism of renal origin: Secondary | ICD-10-CM | POA: Diagnosis not present

## 2020-03-28 DIAGNOSIS — Z992 Dependence on renal dialysis: Secondary | ICD-10-CM | POA: Insufficient documentation

## 2020-03-28 DIAGNOSIS — R519 Headache, unspecified: Secondary | ICD-10-CM | POA: Insufficient documentation

## 2020-03-28 DIAGNOSIS — D631 Anemia in chronic kidney disease: Secondary | ICD-10-CM | POA: Diagnosis not present

## 2020-03-28 DIAGNOSIS — I10 Essential (primary) hypertension: Secondary | ICD-10-CM | POA: Diagnosis not present

## 2020-03-28 DIAGNOSIS — R609 Edema, unspecified: Secondary | ICD-10-CM | POA: Diagnosis not present

## 2020-03-28 DIAGNOSIS — N186 End stage renal disease: Secondary | ICD-10-CM | POA: Diagnosis not present

## 2020-03-28 DIAGNOSIS — R52 Pain, unspecified: Secondary | ICD-10-CM | POA: Diagnosis not present

## 2020-03-28 DIAGNOSIS — G4489 Other headache syndrome: Secondary | ICD-10-CM | POA: Diagnosis not present

## 2020-03-28 DIAGNOSIS — R112 Nausea with vomiting, unspecified: Secondary | ICD-10-CM | POA: Diagnosis not present

## 2020-03-28 DIAGNOSIS — D509 Iron deficiency anemia, unspecified: Secondary | ICD-10-CM | POA: Diagnosis not present

## 2020-03-28 MED ORDER — HYDROMORPHONE HCL 1 MG/ML IJ SOLN
1.0000 mg | Freq: Once | INTRAMUSCULAR | Status: AC
  Administered 2020-03-28: 1 mg via INTRAMUSCULAR
  Filled 2020-03-28: qty 1

## 2020-03-28 MED ORDER — HYDROCODONE-ACETAMINOPHEN 5-325 MG PO TABS: 1.0000 | ORAL_TABLET | Freq: Four times a day (QID) | ORAL | 0 refills | Status: DC | PRN

## 2020-03-28 NOTE — ED Provider Notes (Signed)
[facility] EMERGENCY DEPARTMENT Provider Note   CSN: [id number] Arrival date & time: 03/28/20  0809     History Chief Complaint  Patient presents with   Headache    Victoria [name] is a 64 y.o. female.  Patient is a dialysis patient and she received her for dialysis today but she complains of a headache and high blood pressure.  The history is provided by the patient and medical records. No language interpreter was used.  Headache Pain location:  Frontal Quality:  Dull Severity currently:  6/10 Severity at highest:  8/10 Onset quality:  Sudden Timing:  Constant Progression:  Waxing and waning Chronicity:  New Similar to prior headaches: no   Context: not activity   Relieved by:  Nothing Worsened by:  Nothing Ineffective treatments:  None tried Associated symptoms: no abdominal pain, no back pain, no congestion, no cough, no diarrhea, no fatigue, no seizures and no sinus pressure        Past Medical History:  Diagnosis Date   Chronic bronchitis ([place])    Complication of anesthesia ~ 2011   "they gave me a medicine that swolled me and mouth burning up" (29/01/1884)   Complication of anesthesia    slow to wake up   ESRD (end stage renal disease) on dialysis [facility]) Nephrologist-- dr deterding   ESRD due to HTN-; "M/W/F; [place]" (11/28/2015   GERD (gastroesophageal reflux disease)    History of acute pulmonary edema    2003   Hypertension    no longer on medications   Left patella fracture    Pneumonia    as a child   Seasonal allergies    Secondary hyperparathyroidism, renal ([place])    s/p  total parathyroidectomy  2014   Thyroid disease    Wears glasses     Patient Active Problem List   Diagnosis Date Noted   Dehiscence of operative wound 11/28/2015   Wound dehiscence, surgical 11/28/2015   Renal dialysis device, implant, or graft complication 16/60/6301   Aftercare following surgery of the circulatory system, [facility]  03/30/2013   Mechanical complication of other vascular device, implant, and graft 01/12/2013   Other and unspecified hyperlipidemia 10/25/2012   End stage renal disease ([place]) 09/22/2012   Chest pain, mid sternal 08/23/2012   Hyperparathyroidism, secondary renal ([place]) 03/22/2011   Thyroid nodule 03/22/2011   High blood pressure 03/10/2011   Kidney disease 03/10/2011    Past Surgical History:  Procedure Laterality Date   ANKLE FRACTURE SURGERY Right ~ 2005   "has pins in it"   APPLICATION OF WOUND VAC Left 02/19/931   knee   APPLICATION OF WOUND VAC Left 11/28/2015   Procedure: APPLICATION OF WOUND VAC;  Surgeon: Rod Can, MD;  Location: [place];  Service: Orthopedics;  Laterality: Left;   ARTERIOVENOUS GRAFT PLACEMENT  03-25-2005   Right forearm  w/  multiple Revision's    CARDIOVASCULAR STRESS TEST  08-24-2012   abnormal nuclear study/  inferolateral and anteroseptal areas of scar,  no ischemia/  normal LV function and wall motion, ef 77%   DIALYSIS FISTULA CREATION  1992   left upper arm ---  w/  Multiple Revision's until 2006   ECTOPIC PREGNANCY SURGERY  1970's   FRACTURE SURGERY     I & D EXTREMITY Left 11/28/2015   Procedure: IRRIGATION AND DEBRIDEMENT LEFT KNEE WOUND ;  Surgeon: Rod Can, MD;  Location: [place];  Service: Orthopedics;  Laterality: Left;   INCISION AND DRAINAGE  OF WOUND Left 11/28/2015   knee   ORIF PATELLA Left 10/31/2015   Procedure: OPEN REDUCTION INTERNAL (ORIF) FIXATION LEFT PATELLA;  Surgeon: Rod Can, MD;  Location: [place];  Service: Orthopedics;  Laterality: Left;   PATELLAR TENDON REPAIR  10/03/2012   Procedure: PATELLA TENDON REPAIR;  Surgeon: Marin Shutter, MD;  Location: [place];  Service: Orthopedics;  Laterality: Right;   PATELLECTOMY  10/03/2012   Procedure: PATELLECTOMY;  Surgeon: Marin Shutter, MD;  Location: [place];  Service: Orthopedics;  Laterality: Right;  RIGHT PARTIAL PATELLECTOMY AND  PATELLA TENDON REPAIR   REVISION OF ARTERIOVENOUS GORETEX GRAFT  09/27/2012   Procedure: REVISION OF ARTERIOVENOUS GORETEX GRAFT;  Surgeon: Mal Misty, MD;  Location: [place];  Service: Vascular;  Laterality: Right;  1) Replacement of venous half of loop with 80mm Gortex graft  2) Excision of erroded pseudoaneurysm of graft with primary closure.   REVISION OF ARTERIOVENOUS GORETEX GRAFT Right 01/23/2013   Procedure: REVISION OF ARTERIOVENOUS GORETEX GRAFT;  Surgeon: Conrad [place], MD;  Location[facility] OR;  Service: Vascular;  Laterality: Right;  Using piece of 49mm x 20cm Gortex graft.    REVISION OF ARTERIOVENOUS GORETEX GRAFT Right 10/07/2015   Procedure: REVISION OF Right arm ARTERIOVENOUS GORETEX GRAFT;  Surgeon: Elam Dutch, MD;  Location: [place];  Service: Vascular;  Laterality: Right;   REVISION OF ARTERIOVENOUS GORETEX GRAFT Right 02/17/2016   Procedure: REVISION OF ARTERIOVENOUS GORETEX GRAFT;  Surgeon: Elam Dutch, MD;  Location: [place];  Service: Vascular;  Laterality: Right;   TOTAL ABDOMINAL HYSTERECTOMY  03-05-2005   w/  Right Ovarian Cystectomy   TOTAL PARATHYROIDECTOMY/  THYROID ISTHMUSECTOMY/  AUTOTRANSPLANTATION PARATHYROID TISSUE TO LEFT BRACHIORIADIALIS MUSCLE  02-18-2011   TRANSTHORACIC ECHOCARDIOGRAM  10-31-2012   mild LVH,  grade 1 diastolic dysfunction,  ef 55-65%/  mild MR/  trivial TR     OB History   No obstetric history on file.     Family History  Problem Relation Age of Onset   Healthy Mother    Hypertension Father    Kidney failure Father    Hypertension Sister    Thyroid disease Sister     Social History   Tobacco Use   Smoking status: Never Smoker   Smokeless tobacco: Never Used  Vaping Use   Vaping Use: Never used  Substance Use Topics   Alcohol use: No    Alcohol/week: 0.0 standard drinks    Comment: quit 2007   Drug use: No    Home Medications Prior to Admission medications   Medication Sig Start Date End Date Taking?  Authorizing Provider  fluticasone (FLONASE) 50 MCG/ACT nasal spray Place 2 sprays into both nostrils daily.    [provider]  HYDROcodone-acetaminophen (NORCO/VICODIN) 5-325 MG tablet Take 1 tablet by mouth every 6 (six) hours as needed for moderate pain. 03/28/20   Milton Ferguson, MD  methocarbamol (ROBAXIN) 500 MG tablet Take 500 mg by mouth 2 (two) times daily. 03/11/20   [provider]  ondansetron (ZOFRAN) 4 MG tablet Take 1 tablet (4 mg total) by mouth every 8 (eight) hours as needed for nausea or vomiting. Patient not taking: Reported on 02/26/2020 03/01/19   Domenic Moras, PA-C    Allergies    Amlodipine, Cefazolin, Cephalosporins, and Lisinopril  Review of Systems   Review of Systems  Constitutional: Negative for appetite change and fatigue.  HENT: Negative for congestion, ear discharge and sinus pressure.   Eyes:  Negative for discharge.  Respiratory: Negative for cough.   Cardiovascular: Negative for chest pain.  Gastrointestinal: Negative for abdominal pain and diarrhea.  Genitourinary: Negative for frequency and hematuria.  Musculoskeletal: Negative for back pain.  Skin: Negative for rash.  Neurological: Positive for headaches. Negative for seizures.  Psychiatric/Behavioral: Negative for hallucinations.    Physical Exam Updated Vital Signs BP (!) 140/105 (BP Location: Left Arm)    Pulse 82    Temp 98.1 F (36.7 C) (Oral)    Resp 20    Ht 5\' 3"  (1.6 m)    Wt 86.5 kg    SpO2 100%    BMI 33.78 kg/m   Physical Exam Vitals reviewed.  Constitutional:      Appearance: She is well-developed.  HENT:     Head: Normocephalic.     Right Ear: Tympanic membrane normal.     Nose: Nose normal.  Eyes:     General: No scleral icterus.    Conjunctiva/sclera: Conjunctivae normal.  Neck:     Thyroid: No thyromegaly.  Cardiovascular:     Rate and Rhythm: Normal rate and regular rhythm.     Heart sounds: No murmur heard.  No friction rub. No gallop.   Pulmonary:      Breath sounds: No stridor. No wheezing or rales.  Chest:     Chest wall: No tenderness.  Abdominal:     General: There is no distension.     Tenderness: There is no abdominal tenderness. There is no rebound.  Musculoskeletal:        General: Normal range of motion.     Cervical back: Neck supple.  Lymphadenopathy:     Cervical: No cervical adenopathy.  Skin:    Findings: No erythema or rash.  Neurological:     Mental Status: She is alert and oriented to person, place, and time.     Motor: No abnormal muscle tone.     Coordination: Coordination normal.  Psychiatric:        Behavior: Behavior normal.     ED Results / Procedures / Treatments   Labs (all labs ordered are listed, but only abnormal results are displayed) Labs Reviewed - No data to display  EKG None  Radiology CT Head Wo Contrast  Result Date: 03/28/2020 CLINICAL DATA:  Headache, intracranial hemorrhage suspected. Additional history provided: Patient reports persistent right frontotemporal headache for 1 month, daily. EXAM: CT HEAD WITHOUT CONTRAST TECHNIQUE: Contiguous axial images were obtained from the base of the skull through the vertex without intravenous contrast. COMPARISON:  MRI/MRV head 02/26/2020. FINDINGS: Brain: Cerebral volume is normal for age. Mild ill-defined hypoattenuation within the cerebral white matter is nonspecific, but consistent with chronic small vessel ischemic disease. A tiny chronic small-vessel infarct within the left cerebellar hemisphere was better appreciated on prior MRI 02/26/2020. There is no acute intracranial hemorrhage. No demarcated cortical infarct is identified. No extra-axial fluid collection. No evidence of intracranial mass. No midline shift. Vascular: No hyperdense vessel.  Atherosclerotic calcifications Skull: Normal. Negative for fracture or focal lesion. Sinuses/Orbits: Visualized orbits show no acute finding. Mild ethmoid sinus mucosal thickening. No significant mastoid  effusion. IMPRESSION: No CT evidence of acute intracranial abnormality. Stable, mild chronic small vessel ischemic disease. Mild ethmoid sinus mucosal thickening. Electronically Signed   By: Kellie Simmering DO   On: 03/28/2020 10:15    Procedures Procedures (including critical care time)  Medications Ordered in ED Medications  HYDROmorphone (DILAUDID) injection 1 mg (1 mg Intramuscular Given 03/28/20 0857)  ED Course  I have reviewed the triage vital signs and the nursing notes.  Pertinent labs & imaging results that were available during my care of the patient were reviewed by me and considered in my medical decision making (see chart for details).    MDM Rules/Calculators/A&P                          Patient improved with pain medicine.  CT scan negative.  He will be discharged home to continue her dialysis next week and follow-up with her nephrologist for her blood pressure        This patient presents to the ED for concern of headache, this involves an extensive number of treatment options, and is a complaint that carries with it a high risk of complications and morbidity.  The differential diagnosis includes cerebral hemorrhage migraine   Lab Tests:   Medicines ordered:   I ordered medication Dilaudid for headache  Imaging Studies ordered:   I ordered imaging studies which included CT head and  I independently visualized and interpreted imaging which showed negative  Additional history obtained:   Additional history obtained from records  Previous records obtained and reviewed.  Consultations Obtained:   Reevaluation:  After the interventions stated above, I reevaluated the patient and found improved  Critical Interventions:     Final Clinical Impression(s) / ED Diagnoses Final diagnoses:  Bad headache    Rx / DC Orders ED Discharge Orders         Ordered    HYDROcodone-acetaminophen (NORCO/VICODIN) 5-325 MG tablet  Every 6 hours PRN      Discontinue  Reprint     03/28/20 1030           Milton Ferguson, MD 03/28/20 1035

## 2020-03-28 NOTE — Discharge Instructions (Signed)
Follow-up with your nephrologist about your blood pressure questions

## 2020-03-28 NOTE — ED Triage Notes (Signed)
Pt comes via EMS from dialysis (MWF- got half treatment today) with complaints of headache x 1 month. She reports BP normally ~88/- but has been running "very high" for her causing headache. Denies cp,sob,vision changes. Pt reports she does not have PCP and only sees nephrology.

## 2020-03-28 NOTE — ED Notes (Signed)
AVS reviewed with patient. Patient ambulatory out of department. Daughter picking patient up.

## 2020-03-31 DIAGNOSIS — Z992 Dependence on renal dialysis: Secondary | ICD-10-CM | POA: Diagnosis not present

## 2020-03-31 DIAGNOSIS — N186 End stage renal disease: Secondary | ICD-10-CM | POA: Diagnosis not present

## 2020-03-31 DIAGNOSIS — N2581 Secondary hyperparathyroidism of renal origin: Secondary | ICD-10-CM | POA: Diagnosis not present

## 2020-03-31 DIAGNOSIS — D509 Iron deficiency anemia, unspecified: Secondary | ICD-10-CM | POA: Diagnosis not present

## 2020-03-31 DIAGNOSIS — D631 Anemia in chronic kidney disease: Secondary | ICD-10-CM | POA: Diagnosis not present

## 2020-04-02 DIAGNOSIS — N2581 Secondary hyperparathyroidism of renal origin: Secondary | ICD-10-CM | POA: Diagnosis not present

## 2020-04-02 DIAGNOSIS — N186 End stage renal disease: Secondary | ICD-10-CM | POA: Diagnosis not present

## 2020-04-02 DIAGNOSIS — D631 Anemia in chronic kidney disease: Secondary | ICD-10-CM | POA: Diagnosis not present

## 2020-04-02 DIAGNOSIS — D509 Iron deficiency anemia, unspecified: Secondary | ICD-10-CM | POA: Diagnosis not present

## 2020-04-02 DIAGNOSIS — Z992 Dependence on renal dialysis: Secondary | ICD-10-CM | POA: Diagnosis not present

## 2020-04-04 DIAGNOSIS — D631 Anemia in chronic kidney disease: Secondary | ICD-10-CM | POA: Diagnosis not present

## 2020-04-04 DIAGNOSIS — Z992 Dependence on renal dialysis: Secondary | ICD-10-CM | POA: Diagnosis not present

## 2020-04-04 DIAGNOSIS — N2581 Secondary hyperparathyroidism of renal origin: Secondary | ICD-10-CM | POA: Diagnosis not present

## 2020-04-04 DIAGNOSIS — N186 End stage renal disease: Secondary | ICD-10-CM | POA: Diagnosis not present

## 2020-04-04 DIAGNOSIS — D509 Iron deficiency anemia, unspecified: Secondary | ICD-10-CM | POA: Diagnosis not present

## 2020-04-07 DIAGNOSIS — N2581 Secondary hyperparathyroidism of renal origin: Secondary | ICD-10-CM | POA: Diagnosis not present

## 2020-04-07 DIAGNOSIS — D509 Iron deficiency anemia, unspecified: Secondary | ICD-10-CM | POA: Diagnosis not present

## 2020-04-07 DIAGNOSIS — Z992 Dependence on renal dialysis: Secondary | ICD-10-CM | POA: Diagnosis not present

## 2020-04-07 DIAGNOSIS — N186 End stage renal disease: Secondary | ICD-10-CM | POA: Diagnosis not present

## 2020-04-07 DIAGNOSIS — D631 Anemia in chronic kidney disease: Secondary | ICD-10-CM | POA: Diagnosis not present

## 2020-04-09 DIAGNOSIS — N2581 Secondary hyperparathyroidism of renal origin: Secondary | ICD-10-CM | POA: Diagnosis not present

## 2020-04-09 DIAGNOSIS — D509 Iron deficiency anemia, unspecified: Secondary | ICD-10-CM | POA: Diagnosis not present

## 2020-04-09 DIAGNOSIS — Z992 Dependence on renal dialysis: Secondary | ICD-10-CM | POA: Diagnosis not present

## 2020-04-09 DIAGNOSIS — D631 Anemia in chronic kidney disease: Secondary | ICD-10-CM | POA: Diagnosis not present

## 2020-04-09 DIAGNOSIS — N186 End stage renal disease: Secondary | ICD-10-CM | POA: Diagnosis not present

## 2020-04-11 DIAGNOSIS — D509 Iron deficiency anemia, unspecified: Secondary | ICD-10-CM | POA: Diagnosis not present

## 2020-04-11 DIAGNOSIS — N2581 Secondary hyperparathyroidism of renal origin: Secondary | ICD-10-CM | POA: Diagnosis not present

## 2020-04-11 DIAGNOSIS — D631 Anemia in chronic kidney disease: Secondary | ICD-10-CM | POA: Diagnosis not present

## 2020-04-11 DIAGNOSIS — N186 End stage renal disease: Secondary | ICD-10-CM | POA: Diagnosis not present

## 2020-04-11 DIAGNOSIS — Z992 Dependence on renal dialysis: Secondary | ICD-10-CM | POA: Diagnosis not present

## 2020-04-14 DIAGNOSIS — D509 Iron deficiency anemia, unspecified: Secondary | ICD-10-CM | POA: Diagnosis not present

## 2020-04-14 DIAGNOSIS — N2581 Secondary hyperparathyroidism of renal origin: Secondary | ICD-10-CM | POA: Diagnosis not present

## 2020-04-14 DIAGNOSIS — Z992 Dependence on renal dialysis: Secondary | ICD-10-CM | POA: Diagnosis not present

## 2020-04-14 DIAGNOSIS — D631 Anemia in chronic kidney disease: Secondary | ICD-10-CM | POA: Diagnosis not present

## 2020-04-14 DIAGNOSIS — N186 End stage renal disease: Secondary | ICD-10-CM | POA: Diagnosis not present

## 2020-04-16 ENCOUNTER — Other Ambulatory Visit: Payer: Self-pay | Admitting: Nephrology

## 2020-04-16 DIAGNOSIS — D509 Iron deficiency anemia, unspecified: Secondary | ICD-10-CM | POA: Diagnosis not present

## 2020-04-16 DIAGNOSIS — N2581 Secondary hyperparathyroidism of renal origin: Secondary | ICD-10-CM | POA: Diagnosis not present

## 2020-04-16 DIAGNOSIS — D631 Anemia in chronic kidney disease: Secondary | ICD-10-CM | POA: Diagnosis not present

## 2020-04-16 DIAGNOSIS — N186 End stage renal disease: Secondary | ICD-10-CM | POA: Diagnosis not present

## 2020-04-16 DIAGNOSIS — Z992 Dependence on renal dialysis: Secondary | ICD-10-CM | POA: Diagnosis not present

## 2020-04-16 DIAGNOSIS — Z1231 Encounter for screening mammogram for malignant neoplasm of breast: Secondary | ICD-10-CM

## 2020-04-18 DIAGNOSIS — D631 Anemia in chronic kidney disease: Secondary | ICD-10-CM | POA: Diagnosis not present

## 2020-04-18 DIAGNOSIS — N2581 Secondary hyperparathyroidism of renal origin: Secondary | ICD-10-CM | POA: Diagnosis not present

## 2020-04-18 DIAGNOSIS — N186 End stage renal disease: Secondary | ICD-10-CM | POA: Diagnosis not present

## 2020-04-18 DIAGNOSIS — D509 Iron deficiency anemia, unspecified: Secondary | ICD-10-CM | POA: Diagnosis not present

## 2020-04-18 DIAGNOSIS — Z992 Dependence on renal dialysis: Secondary | ICD-10-CM | POA: Diagnosis not present

## 2020-04-20 DIAGNOSIS — N186 End stage renal disease: Secondary | ICD-10-CM | POA: Diagnosis not present

## 2020-04-20 DIAGNOSIS — I120 Hypertensive chronic kidney disease with stage 5 chronic kidney disease or end stage renal disease: Secondary | ICD-10-CM | POA: Diagnosis not present

## 2020-04-20 DIAGNOSIS — Z992 Dependence on renal dialysis: Secondary | ICD-10-CM | POA: Diagnosis not present

## 2020-04-21 DIAGNOSIS — D509 Iron deficiency anemia, unspecified: Secondary | ICD-10-CM | POA: Diagnosis not present

## 2020-04-21 DIAGNOSIS — N186 End stage renal disease: Secondary | ICD-10-CM | POA: Diagnosis not present

## 2020-04-21 DIAGNOSIS — N2581 Secondary hyperparathyroidism of renal origin: Secondary | ICD-10-CM | POA: Diagnosis not present

## 2020-04-21 DIAGNOSIS — Z992 Dependence on renal dialysis: Secondary | ICD-10-CM | POA: Diagnosis not present

## 2020-04-23 DIAGNOSIS — N186 End stage renal disease: Secondary | ICD-10-CM | POA: Diagnosis not present

## 2020-04-23 DIAGNOSIS — Z992 Dependence on renal dialysis: Secondary | ICD-10-CM | POA: Diagnosis not present

## 2020-04-23 DIAGNOSIS — D509 Iron deficiency anemia, unspecified: Secondary | ICD-10-CM | POA: Diagnosis not present

## 2020-04-23 DIAGNOSIS — N2581 Secondary hyperparathyroidism of renal origin: Secondary | ICD-10-CM | POA: Diagnosis not present

## 2020-04-25 DIAGNOSIS — Z992 Dependence on renal dialysis: Secondary | ICD-10-CM | POA: Diagnosis not present

## 2020-04-25 DIAGNOSIS — N2581 Secondary hyperparathyroidism of renal origin: Secondary | ICD-10-CM | POA: Diagnosis not present

## 2020-04-25 DIAGNOSIS — D509 Iron deficiency anemia, unspecified: Secondary | ICD-10-CM | POA: Diagnosis not present

## 2020-04-25 DIAGNOSIS — N186 End stage renal disease: Secondary | ICD-10-CM | POA: Diagnosis not present

## 2020-04-28 DIAGNOSIS — N2581 Secondary hyperparathyroidism of renal origin: Secondary | ICD-10-CM | POA: Diagnosis not present

## 2020-04-28 DIAGNOSIS — N186 End stage renal disease: Secondary | ICD-10-CM | POA: Diagnosis not present

## 2020-04-28 DIAGNOSIS — Z992 Dependence on renal dialysis: Secondary | ICD-10-CM | POA: Diagnosis not present

## 2020-04-28 DIAGNOSIS — D509 Iron deficiency anemia, unspecified: Secondary | ICD-10-CM | POA: Diagnosis not present

## 2020-04-30 DIAGNOSIS — D509 Iron deficiency anemia, unspecified: Secondary | ICD-10-CM | POA: Diagnosis not present

## 2020-04-30 DIAGNOSIS — N186 End stage renal disease: Secondary | ICD-10-CM | POA: Diagnosis not present

## 2020-04-30 DIAGNOSIS — N2581 Secondary hyperparathyroidism of renal origin: Secondary | ICD-10-CM | POA: Diagnosis not present

## 2020-04-30 DIAGNOSIS — Z992 Dependence on renal dialysis: Secondary | ICD-10-CM | POA: Diagnosis not present

## 2020-05-01 ENCOUNTER — Emergency Department (HOSPITAL_COMMUNITY)
Admission: EM | Admit: 2020-05-01 | Discharge: 2020-05-01 | Disposition: A | Discharge status: Home / Self Care | Payer: Medicare Other | Attending: Emergency Medicine | Admitting: Emergency Medicine

## 2020-05-01 ENCOUNTER — Other Ambulatory Visit: Payer: Self-pay

## 2020-05-01 ENCOUNTER — Encounter (HOSPITAL_COMMUNITY): Payer: Self-pay

## 2020-05-01 DIAGNOSIS — H2511 Age-related nuclear cataract, right eye: Secondary | ICD-10-CM | POA: Diagnosis not present

## 2020-05-01 DIAGNOSIS — H2513 Age-related nuclear cataract, bilateral: Secondary | ICD-10-CM | POA: Diagnosis not present

## 2020-05-01 DIAGNOSIS — H25013 Cortical age-related cataract, bilateral: Secondary | ICD-10-CM | POA: Diagnosis not present

## 2020-05-01 DIAGNOSIS — Z5321 Procedure and treatment not carried out due to patient leaving prior to being seen by health care provider: Secondary | ICD-10-CM | POA: Insufficient documentation

## 2020-05-01 DIAGNOSIS — R519 Headache, unspecified: Secondary | ICD-10-CM | POA: Diagnosis not present

## 2020-05-01 DIAGNOSIS — H25043 Posterior subcapsular polar age-related cataract, bilateral: Secondary | ICD-10-CM | POA: Diagnosis not present

## 2020-05-01 DIAGNOSIS — H18413 Arcus senilis, bilateral: Secondary | ICD-10-CM | POA: Diagnosis not present

## 2020-05-01 NOTE — ED Triage Notes (Signed)
Patient sent from Dr. Talbert Forest for further evaluation of headache x 1 month. MD request CRP and sed rate. Patient seen x 3 in ED for same and all test negative. Alert and oriented, NAD

## 2020-05-01 NOTE — ED Notes (Signed)
Pt stated she couldn't stay to get seen as she had to go get her grandkids. This NT informed pt that we wanted her to stay and get seen, and updated pt on wait time. Pt stated she still needed to leave and verbalized understanding.

## 2020-05-02 DIAGNOSIS — N186 End stage renal disease: Secondary | ICD-10-CM | POA: Diagnosis not present

## 2020-05-02 DIAGNOSIS — D509 Iron deficiency anemia, unspecified: Secondary | ICD-10-CM | POA: Diagnosis not present

## 2020-05-02 DIAGNOSIS — Z992 Dependence on renal dialysis: Secondary | ICD-10-CM | POA: Diagnosis not present

## 2020-05-02 DIAGNOSIS — N2581 Secondary hyperparathyroidism of renal origin: Secondary | ICD-10-CM | POA: Diagnosis not present

## 2020-05-05 DIAGNOSIS — Z992 Dependence on renal dialysis: Secondary | ICD-10-CM | POA: Diagnosis not present

## 2020-05-05 DIAGNOSIS — N186 End stage renal disease: Secondary | ICD-10-CM | POA: Diagnosis not present

## 2020-05-05 DIAGNOSIS — D509 Iron deficiency anemia, unspecified: Secondary | ICD-10-CM | POA: Diagnosis not present

## 2020-05-05 DIAGNOSIS — N2581 Secondary hyperparathyroidism of renal origin: Secondary | ICD-10-CM | POA: Diagnosis not present

## 2020-05-07 DIAGNOSIS — Z992 Dependence on renal dialysis: Secondary | ICD-10-CM | POA: Diagnosis not present

## 2020-05-07 DIAGNOSIS — N2581 Secondary hyperparathyroidism of renal origin: Secondary | ICD-10-CM | POA: Diagnosis not present

## 2020-05-07 DIAGNOSIS — D509 Iron deficiency anemia, unspecified: Secondary | ICD-10-CM | POA: Diagnosis not present

## 2020-05-07 DIAGNOSIS — N186 End stage renal disease: Secondary | ICD-10-CM | POA: Diagnosis not present

## 2020-05-09 DIAGNOSIS — Z992 Dependence on renal dialysis: Secondary | ICD-10-CM | POA: Diagnosis not present

## 2020-05-09 DIAGNOSIS — N186 End stage renal disease: Secondary | ICD-10-CM | POA: Diagnosis not present

## 2020-05-09 DIAGNOSIS — N2581 Secondary hyperparathyroidism of renal origin: Secondary | ICD-10-CM | POA: Diagnosis not present

## 2020-05-09 DIAGNOSIS — D509 Iron deficiency anemia, unspecified: Secondary | ICD-10-CM | POA: Diagnosis not present

## 2020-05-12 DIAGNOSIS — Z992 Dependence on renal dialysis: Secondary | ICD-10-CM | POA: Diagnosis not present

## 2020-05-12 DIAGNOSIS — N2581 Secondary hyperparathyroidism of renal origin: Secondary | ICD-10-CM | POA: Diagnosis not present

## 2020-05-12 DIAGNOSIS — N186 End stage renal disease: Secondary | ICD-10-CM | POA: Diagnosis not present

## 2020-05-12 DIAGNOSIS — D509 Iron deficiency anemia, unspecified: Secondary | ICD-10-CM | POA: Diagnosis not present

## 2020-05-14 DIAGNOSIS — N186 End stage renal disease: Secondary | ICD-10-CM | POA: Diagnosis not present

## 2020-05-14 DIAGNOSIS — N2581 Secondary hyperparathyroidism of renal origin: Secondary | ICD-10-CM | POA: Diagnosis not present

## 2020-05-14 DIAGNOSIS — D509 Iron deficiency anemia, unspecified: Secondary | ICD-10-CM | POA: Diagnosis not present

## 2020-05-14 DIAGNOSIS — Z992 Dependence on renal dialysis: Secondary | ICD-10-CM | POA: Diagnosis not present

## 2020-05-16 DIAGNOSIS — N2581 Secondary hyperparathyroidism of renal origin: Secondary | ICD-10-CM | POA: Diagnosis not present

## 2020-05-16 DIAGNOSIS — Z992 Dependence on renal dialysis: Secondary | ICD-10-CM | POA: Diagnosis not present

## 2020-05-16 DIAGNOSIS — D509 Iron deficiency anemia, unspecified: Secondary | ICD-10-CM | POA: Diagnosis not present

## 2020-05-16 DIAGNOSIS — N186 End stage renal disease: Secondary | ICD-10-CM | POA: Diagnosis not present

## 2020-05-19 DIAGNOSIS — D509 Iron deficiency anemia, unspecified: Secondary | ICD-10-CM | POA: Diagnosis not present

## 2020-05-19 DIAGNOSIS — N2581 Secondary hyperparathyroidism of renal origin: Secondary | ICD-10-CM | POA: Diagnosis not present

## 2020-05-19 DIAGNOSIS — N186 End stage renal disease: Secondary | ICD-10-CM | POA: Diagnosis not present

## 2020-05-19 DIAGNOSIS — Z992 Dependence on renal dialysis: Secondary | ICD-10-CM | POA: Diagnosis not present

## 2020-05-20 ENCOUNTER — Other Ambulatory Visit: Payer: Self-pay

## 2020-05-20 ENCOUNTER — Ambulatory Visit
Admission: RE | Admit: 2020-05-20 | Discharge: 2020-05-20 | Disposition: A | Discharge status: Home / Self Care | Payer: Medicare Other | Source: Ambulatory Visit | Attending: Nephrology | Admitting: Nephrology

## 2020-05-20 DIAGNOSIS — Z1231 Encounter for screening mammogram for malignant neoplasm of breast: Secondary | ICD-10-CM

## 2020-05-21 ENCOUNTER — Encounter (HOSPITAL_COMMUNITY): Payer: Self-pay | Admitting: Emergency Medicine

## 2020-05-21 ENCOUNTER — Emergency Department (HOSPITAL_COMMUNITY)
Admission: EM | Admit: 2020-05-21 | Discharge: 2020-05-21 | Disposition: A | Discharge status: Home / Self Care | Payer: Medicare Other | Attending: Emergency Medicine | Admitting: Emergency Medicine

## 2020-05-21 DIAGNOSIS — N189 Chronic kidney disease, unspecified: Secondary | ICD-10-CM | POA: Diagnosis not present

## 2020-05-21 DIAGNOSIS — Z79899 Other long term (current) drug therapy: Secondary | ICD-10-CM | POA: Insufficient documentation

## 2020-05-21 DIAGNOSIS — I129 Hypertensive chronic kidney disease with stage 1 through stage 4 chronic kidney disease, or unspecified chronic kidney disease: Secondary | ICD-10-CM | POA: Insufficient documentation

## 2020-05-21 DIAGNOSIS — R1110 Vomiting, unspecified: Secondary | ICD-10-CM | POA: Insufficient documentation

## 2020-05-21 DIAGNOSIS — R531 Weakness: Secondary | ICD-10-CM | POA: Diagnosis not present

## 2020-05-21 DIAGNOSIS — I120 Hypertensive chronic kidney disease with stage 5 chronic kidney disease or end stage renal disease: Secondary | ICD-10-CM | POA: Diagnosis not present

## 2020-05-21 DIAGNOSIS — R519 Headache, unspecified: Secondary | ICD-10-CM | POA: Insufficient documentation

## 2020-05-21 DIAGNOSIS — D509 Iron deficiency anemia, unspecified: Secondary | ICD-10-CM | POA: Diagnosis not present

## 2020-05-21 DIAGNOSIS — N2581 Secondary hyperparathyroidism of renal origin: Secondary | ICD-10-CM | POA: Diagnosis not present

## 2020-05-21 DIAGNOSIS — D631 Anemia in chronic kidney disease: Secondary | ICD-10-CM | POA: Diagnosis not present

## 2020-05-21 DIAGNOSIS — Z992 Dependence on renal dialysis: Secondary | ICD-10-CM | POA: Diagnosis not present

## 2020-05-21 DIAGNOSIS — I10 Essential (primary) hypertension: Secondary | ICD-10-CM

## 2020-05-21 DIAGNOSIS — Z23 Encounter for immunization: Secondary | ICD-10-CM | POA: Diagnosis not present

## 2020-05-21 DIAGNOSIS — N186 End stage renal disease: Secondary | ICD-10-CM | POA: Insufficient documentation

## 2020-05-21 DIAGNOSIS — G4489 Other headache syndrome: Secondary | ICD-10-CM | POA: Diagnosis not present

## 2020-05-21 DIAGNOSIS — R609 Edema, unspecified: Secondary | ICD-10-CM | POA: Diagnosis not present

## 2020-05-21 LAB — COMPREHENSIVE METABOLIC PANEL
ALT: 10 U/L (ref 0–44)
AST: 14 U/L — ABNORMAL LOW (ref 15–41)
Albumin: 2.9 g/dL — ABNORMAL LOW (ref 3.5–5.0)
Alkaline Phosphatase: 60 U/L (ref 38–126)
Anion gap: 10 (ref 5–15)
BUN: 16 mg/dL (ref 8–23)
CO2: 31 mmol/L (ref 22–32)
Calcium: 9.9 mg/dL (ref 8.9–10.3)
Chloride: 96 mmol/L — ABNORMAL LOW (ref 98–111)
Creatinine, Ser: 6.38 mg/dL — ABNORMAL HIGH (ref 0.44–1.00)
GFR calc Af Amer: 7 mL/min — ABNORMAL LOW (ref >60)
GFR calc non Af Amer: 6 mL/min — ABNORMAL LOW (ref >60)
Glucose, Bld: 90 mg/dL (ref 70–99)
Potassium: 3 mmol/L — ABNORMAL LOW (ref 3.5–5.1)
Sodium: 137 mmol/L (ref 135–145)
Total Bilirubin: 0.9 mg/dL (ref 0.3–1.2)
Total Protein: 7 g/dL (ref 6.5–8.1)

## 2020-05-21 LAB — CBC WITH DIFFERENTIAL/PLATELET
Abs Immature Granulocytes: 0.04 10*3/uL (ref 0.00–0.07)
Basophils Absolute: 0.1 10*3/uL (ref 0.0–0.1)
Basophils Relative: 1 %
Eosinophils Absolute: 0.4 10*3/uL (ref 0.0–0.5)
Eosinophils Relative: 5 %
HCT: 35.2 % — ABNORMAL LOW (ref 36.0–46.0)
Hemoglobin: 10.8 g/dL — ABNORMAL LOW (ref 12.0–15.0)
Immature Granulocytes: 1 %
Lymphocytes Relative: 18 %
Lymphs Abs: 1.6 10*3/uL (ref 0.7–4.0)
MCH: 24.2 pg — ABNORMAL LOW (ref 26.0–34.0)
MCHC: 30.7 g/dL (ref 30.0–36.0)
MCV: 78.7 fL — ABNORMAL LOW (ref 80.0–100.0)
Monocytes Absolute: 0.6 10*3/uL (ref 0.1–1.0)
Monocytes Relative: 7 %
Neutro Abs: 5.9 10*3/uL (ref 1.7–7.7)
Neutrophils Relative %: 68 %
Platelets: 347 10*3/uL (ref 150–400)
RBC: 4.47 MIL/uL (ref 3.87–5.11)
RDW: 18.6 % — ABNORMAL HIGH (ref 11.5–15.5)
WBC: 8.7 10*3/uL (ref 4.0–10.5)
nRBC: 0 % (ref 0.0–0.2)

## 2020-05-21 MED ORDER — MORPHINE SULFATE (PF) 4 MG/ML IV SOLN
4.0000 mg | Freq: Once | INTRAVENOUS | Status: AC
  Administered 2020-05-21: 4 mg via INTRAVENOUS
  Filled 2020-05-21: qty 1

## 2020-05-21 MED ORDER — DIPHENHYDRAMINE HCL 50 MG/ML IJ SOLN
12.5000 mg | Freq: Once | INTRAMUSCULAR | Status: AC
  Administered 2020-05-21: 12.5 mg via INTRAVENOUS
  Filled 2020-05-21: qty 1

## 2020-05-21 MED ORDER — HYDRALAZINE HCL 20 MG/ML IJ SOLN
5.0000 mg | Freq: Once | INTRAMUSCULAR | Status: AC
  Administered 2020-05-21: 5 mg via INTRAVENOUS
  Filled 2020-05-21: qty 1

## 2020-05-21 MED ORDER — HYDRALAZINE HCL 25 MG PO TABS: 25.0000 mg | ORAL_TABLET | Freq: Two times a day (BID) | ORAL | 0 refills | Status: DC

## 2020-05-21 MED ORDER — METOCLOPRAMIDE HCL 5 MG/ML IJ SOLN
10.0000 mg | Freq: Once | INTRAMUSCULAR | Status: AC
  Administered 2020-05-21: 10 mg via INTRAVENOUS
  Filled 2020-05-21: qty 2

## 2020-05-21 NOTE — Discharge Instructions (Signed)
Start taking hydralazine twice daily.  If your blood pressure is too low during dialysis, you may need to hold blood pressure medicine after you had dialysis.  Please talk to your kidney doctor and primary care doctor.  You need to repeat your blood pressure either at dialysis or at the office in a week.  Return to ER if you have worse headaches, vomiting, dizziness.

## 2020-05-21 NOTE — ED Provider Notes (Signed)
[facility] EMERGENCY DEPARTMENT Provider Note   CSN: [id number] Arrival date & time: 05/21/20  2585     History Chief Complaint  Patient presents with  . Headache  . Hypertension  . Leg Pain    Victoria [name] is a 64 y.o. female hx of ESRD on dialysis (HD was today), here presenting with headaches.  Headaches for the last several months.  Patient had multiple work-up in the ED including an MRI brain, and CT head which were unremarkable.  She states that she has persistent headaches and her blood pressure has been persistently elevated in the 160s to 180s.  Is not currently on blood pressure medicines right now but was on amlodipine and lisinopril and developed allergies to them.  Patient states that her headaches were worse today and she started vomiting 20 minutes into dialysis so was unable to complete her dialysis.  Patient also has some bilateral leg pain as well.   The history is provided by the patient.       Past Medical History:  Diagnosis Date  . Chronic bronchitis ([place])   . Complication of anesthesia ~ 2011   "they gave me a medicine that swolled me and mouth burning up" (08/23/2012)  . Complication of anesthesia    slow to wake up  . ESRD (end stage renal disease) on dialysis [facility]) Nephrologist-- dr deterding   ESRD due to HTN-; "M/W/F; [place]" (11/28/2015  . GERD (gastroesophageal reflux disease)   . History of acute pulmonary edema    2003  . Hypertension    no longer on medications  . Left patella fracture   . Pneumonia    as a child  . Seasonal allergies   . Secondary hyperparathyroidism, renal ([place])    s/p  total parathyroidectomy  2014  . Thyroid disease   . Wears glasses     Patient Active Problem List   Diagnosis Date Noted  . Dehiscence of operative wound 11/28/2015  . Wound dehiscence, surgical 11/28/2015  . Renal dialysis device, implant, or graft complication 27/78/2423  . Aftercare following surgery of the circulatory  system, [place] 03/30/2013  . Mechanical complication of other vascular device, implant, and graft 01/12/2013  . Other and unspecified hyperlipidemia 10/25/2012  . End stage renal disease ([place]) 09/22/2012  . Chest pain, mid sternal 08/23/2012  . Hyperparathyroidism, secondary renal ([place]) 03/22/2011  . Thyroid nodule 03/22/2011  . High blood pressure 03/10/2011  . Kidney disease 03/10/2011    Past Surgical History:  Procedure Laterality Date  . ANKLE FRACTURE SURGERY Right ~ 2005   "has pins in it"  . APPLICATION OF WOUND VAC Left 11/28/2015   knee  . APPLICATION OF WOUND VAC Left 11/28/2015   Procedure: APPLICATION OF WOUND VAC;  Surgeon: Rod Can, MD;  Location: [place];  Service: Orthopedics;  Laterality: Left;  . ARTERIOVENOUS GRAFT PLACEMENT  03-25-2005   Right forearm  w/  multiple Revision's   . CARDIOVASCULAR STRESS TEST  08-24-2012   abnormal nuclear study/  inferolateral and anteroseptal areas of scar,  no ischemia/  normal LV function and wall motion, ef 77%  . DIALYSIS FISTULA CREATION  1992   left upper arm ---  w/  Multiple Revision's until 2006  . ECTOPIC PREGNANCY SURGERY  1970's  . FRACTURE SURGERY    . I & D EXTREMITY Left 11/28/2015   Procedure: IRRIGATION AND DEBRIDEMENT LEFT KNEE WOUND ;  Surgeon: Rod Can, MD;  Location: [place];  Service:  Orthopedics;  Laterality: Left;  . INCISION AND DRAINAGE OF WOUND Left 11/28/2015   knee  . ORIF PATELLA Left 10/31/2015   Procedure: OPEN REDUCTION INTERNAL (ORIF) FIXATION LEFT PATELLA;  Surgeon: Rod Can, MD;  Location: [place];  Service: Orthopedics;  Laterality: Left;  . PATELLAR TENDON REPAIR  10/03/2012   Procedure: PATELLA TENDON REPAIR;  Surgeon: Marin Shutter, MD;  Location: [place];  Service: Orthopedics;  Laterality: Right;  . PATELLECTOMY  10/03/2012   Procedure: PATELLECTOMY;  Surgeon: Marin Shutter, MD;  Location: [place];  Service: Orthopedics;  Laterality: Right;  RIGHT PARTIAL  PATELLECTOMY AND PATELLA TENDON REPAIR  . REVISION OF ARTERIOVENOUS GORETEX GRAFT  09/27/2012   Procedure: REVISION OF ARTERIOVENOUS GORETEX GRAFT;  Surgeon: Mal Misty, MD;  Location: [place];  Service: Vascular;  Laterality: Right;  1) Replacement of venous half of loop with 80mm Gortex graft  2) Excision of erroded pseudoaneurysm of graft with primary closure.  Marland Kitchen REVISION OF ARTERIOVENOUS GORETEX GRAFT Right 01/23/2013   Procedure: REVISION OF ARTERIOVENOUS GORETEX GRAFT;  Surgeon: Conrad E[place]aula, MD;  Location[facility] OR;  Service: Vascular;  Laterality: Right;  Using piece of 9mm x 20cm Gortex graft.   Marland Kitchen REVISION OF ARTERIOVENOUS GORETEX GRAFT Right 10/07/2015   Procedure: REVISION OF Right arm ARTERIOVENOUS GORETEX GRAFT;  Surgeon: Elam Dutch, MD;  Location: [facility] OR;  Service: Vascular;  Laterality: Right;  . REVISION OF ARTERIOVENOUS GORETEX GRAFT Right 02/17/2016   Procedure: REVISION OF ARTERIOVENOUS GORETEX GRAFT;  Surgeon: Elam Dutch, MD;  Location: [place];  Service: Vascular;  Laterality: Right;  . TOTAL ABDOMINAL HYSTERECTOMY  03-05-2005   w/  Right Ovarian Cystectomy  . TOTAL PARATHYROIDECTOMY/  THYROID ISTHMUSECTOMY/  AUTOTRANSPLANTATION PARATHYROID TISSUE TO LEFT BRACHIORIADIALIS MUSCLE  02-18-2011  . TRANSTHORACIC ECHOCARDIOGRAM  10-31-2012   mild LVH,  grade 1 diastolic dysfunction,  ef 55-65%/  mild MR/  trivial TR     OB History   No obstetric history on file.     Family History  Problem Relation Age of Onset  . Healthy Mother   . Hypertension Father   . Kidney failure Father   . Hypertension Sister   . Thyroid disease Sister     Social History   Tobacco Use  . Smoking status: Never Smoker  . Smokeless tobacco: Never Used  Vaping Use  . Vaping Use: Never used  Substance Use Topics  . Alcohol use: No    Alcohol/week: 0.0 standard drinks    Comment: quit 2007  . Drug use: No    Home Medications Prior to Admission medications   Medication Sig Start Date  End Date Taking? Authorizing Provider  fluticasone (FLONASE) 50 MCG/ACT nasal spray Place 2 sprays into both nostrils daily.    [provider]  HYDROcodone-acetaminophen (NORCO/VICODIN) 5-325 MG tablet Take 1 tablet by mouth every 6 (six) hours as needed for moderate pain. 03/28/20   Milton Ferguson, MD  methocarbamol (ROBAXIN) 500 MG tablet Take 500 mg by mouth 2 (two) times daily. 03/11/20   [provider]  ondansetron (ZOFRAN) 4 MG tablet Take 1 tablet (4 mg total) by mouth every 8 (eight) hours as needed for nausea or vomiting. Patient not taking: Reported on 02/26/2020 03/01/19   Domenic Moras, PA-C    Allergies    Amlodipine, Cefazolin, Cephalosporins, and Lisinopril  Review of Systems   Review of Systems  Gastrointestinal: Positive for vomiting.  Neurological: Positive for headaches.  All  other systems reviewed and are negative.   Physical Exam Updated Vital Signs BP (!) 166/106   Pulse 88   Temp 98.1 F (36.7 C) (Oral)   Resp 16   SpO2 100%   Physical Exam Vitals and nursing note reviewed.  Constitutional:      Comments: Uncomfortable, vomiting  HENT:     Head: Normocephalic.     Mouth/Throat:     Pharynx: Oropharynx is clear.  Eyes:     Extraocular Movements: Extraocular movements intact.     Pupils: Pupils are equal, round, and reactive to light.  Cardiovascular:     Rate and Rhythm: Normal rate and regular rhythm.     Heart sounds: Normal heart sounds.  Pulmonary:     Effort: Pulmonary effort is normal.     Breath sounds: Normal breath sounds.  Abdominal:     General: Bowel sounds are normal.     Palpations: Abdomen is soft.  Musculoskeletal:        General: Normal range of motion.     Cervical back: Normal range of motion and neck supple.     Comments: Trace pedal edema bilaterally.  No calf tenderness  Skin:    General: Skin is warm.  Neurological:     Mental Status: She is alert and oriented to person, place, and time.     Cranial  Nerves: No cranial nerve deficit.     Sensory: No sensory deficit.     Motor: No weakness.     Coordination: Romberg sign negative.     Comments: Normal strength and sensation bilaterally.  Cranial nerves II to XII intact.   Psychiatric:        Mood and Affect: Mood normal.        Behavior: Behavior normal.     ED Results / Procedures / Treatments   Labs (all labs ordered are listed, but only abnormal results are displayed) Labs Reviewed  COMPREHENSIVE METABOLIC PANEL - Abnormal; Notable for the following components:      Result Value   Potassium 3.0 (*)    Chloride 96 (*)    Creatinine, Ser 6.38 (*)    Albumin 2.9 (*)    AST 14 (*)    GFR calc non Af Amer 6 (*)    GFR calc Af Amer 7 (*)    All other components within normal limits  CBC WITH DIFFERENTIAL/PLATELET - Abnormal; Notable for the following components:   Hemoglobin 10.8 (*)    HCT 35.2 (*)    MCV 78.7 (*)    MCH 24.2 (*)    RDW 18.6 (*)    All other components within normal limits    EKG None  Radiology MM DIGITAL SCREENING BILATERAL  Result Date: 05/21/2020 CLINICAL DATA:  Screening. EXAM: DIGITAL SCREENING BILATERAL MAMMOGRAM WITH CAD COMPARISON:  Previous exam(s). ACR Breast Density Category b: There are scattered areas of fibroglandular density. FINDINGS: There are no findings suspicious for malignancy. Images were processed with CAD. IMPRESSION: No mammographic evidence of malignancy. A result letter of this screening mammogram will be mailed directly to the patient. RECOMMENDATION: Screening mammogram in one year. (Code:SM-B-01Y) BI-RADS CATEGORY  1: Negative. Electronically Signed   By: Kristopher Oppenheim M.D.   On: 05/21/2020 14:12    Procedures Procedures (including critical care time)  Medications Ordered in ED Medications  hydrALAZINE (APRESOLINE) injection 5 mg (has no administration in time range)  morphine 4 MG/ML injection 4 mg (has no administration in time range)  metoCLOPramide (REGLAN)  injection 10 mg (has no administration in time range)  diphenhydrAMINE (BENADRYL) injection 12.5 mg (has no administration in time range)    ED Course  I have reviewed the triage vital signs and the nursing notes.  Pertinent labs & imaging results that were available during my care of the patient were reviewed by me and considered in my medical decision making (see chart for details).    MDM Rules/Calculators/A&P                          Victoria [name] is a 64 y.o. female here with headaches and hypertension.  I think likely symptomatic hypertension.  This is been going on for the last several months and patient had multiple negative MRI and CT scans.  Patient blood pressure has been consistently elevated around 180s.  She does have multiple allergies to BP meds. We will try hydralazine .and check CBC and CMP. We will also give migraine cocktail and reassess.    6:58 PM Labs show potassium 3.0 and creatinine is stable at 6.3.  Patient's blood pressure went down from 200 to 150 after hydralazine and migraine cocktail.  Patient states that her headache has improved.  Will start patient on hydralazine and have her go to dialysis tomorrow or get an extra session on Friday.  Patient will need to repeat her blood pressure either at dialysis with PCP in a week.  Final Clinical Impression(s) / ED Diagnoses Final diagnoses:  None    Rx / DC Orders ED Discharge Orders    None       Drenda Freeze, MD 05/21/20 1859

## 2020-05-21 NOTE — ED Triage Notes (Signed)
Per [facility] pt c/o headache, HTN and BLE pain. Seen here multiple times for ongoing headache and HTN, states PCP will not prescribe anything for HTN. BLE pain is new onset  Pt from dialysis center today, received 1.5 hours out of a 3.5 hour treatment. Pt unable to complete dialysis   BP 180/100 HR 94 RR 20 100% RA  96.8

## 2020-05-22 ENCOUNTER — Encounter: Payer: Self-pay | Admitting: Interventional Cardiology

## 2020-05-22 ENCOUNTER — Telehealth: Payer: Self-pay | Admitting: *Deleted

## 2020-05-22 NOTE — Telephone Encounter (Signed)
Pt called regarding her misplacing Rx she received from recent ER visit.  RNCM called in Rx as written to [facility] on [address].

## 2020-05-22 NOTE — Telephone Encounter (Signed)
Error

## 2020-05-23 DIAGNOSIS — Z23 Encounter for immunization: Secondary | ICD-10-CM | POA: Diagnosis not present

## 2020-05-23 DIAGNOSIS — N186 End stage renal disease: Secondary | ICD-10-CM | POA: Diagnosis not present

## 2020-05-23 DIAGNOSIS — D509 Iron deficiency anemia, unspecified: Secondary | ICD-10-CM | POA: Diagnosis not present

## 2020-05-23 DIAGNOSIS — D631 Anemia in chronic kidney disease: Secondary | ICD-10-CM | POA: Diagnosis not present

## 2020-05-23 DIAGNOSIS — Z992 Dependence on renal dialysis: Secondary | ICD-10-CM | POA: Diagnosis not present

## 2020-05-26 ENCOUNTER — Ambulatory Visit (HOSPITAL_COMMUNITY)
Admission: EM | Admit: 2020-05-26 | Discharge: 2020-05-26 | Disposition: A | Discharge status: Home / Self Care | Payer: Medicare Other | Attending: Family Medicine | Admitting: Family Medicine

## 2020-05-26 ENCOUNTER — Encounter (HOSPITAL_COMMUNITY): Payer: Self-pay | Admitting: Emergency Medicine

## 2020-05-26 ENCOUNTER — Emergency Department (HOSPITAL_COMMUNITY)
Admission: EM | Admit: 2020-05-26 | Discharge: 2020-05-26 | Disposition: A | Discharge status: Home / Self Care | Payer: Medicare Other | Attending: Emergency Medicine | Admitting: Emergency Medicine

## 2020-05-26 ENCOUNTER — Other Ambulatory Visit: Payer: Self-pay

## 2020-05-26 ENCOUNTER — Emergency Department (HOSPITAL_COMMUNITY): Payer: Medicare Other

## 2020-05-26 DIAGNOSIS — I120 Hypertensive chronic kidney disease with stage 5 chronic kidney disease or end stage renal disease: Secondary | ICD-10-CM | POA: Insufficient documentation

## 2020-05-26 DIAGNOSIS — Z79899 Other long term (current) drug therapy: Secondary | ICD-10-CM | POA: Diagnosis not present

## 2020-05-26 DIAGNOSIS — J42 Unspecified chronic bronchitis: Secondary | ICD-10-CM | POA: Diagnosis not present

## 2020-05-26 DIAGNOSIS — K5900 Constipation, unspecified: Secondary | ICD-10-CM

## 2020-05-26 DIAGNOSIS — D509 Iron deficiency anemia, unspecified: Secondary | ICD-10-CM | POA: Diagnosis not present

## 2020-05-26 DIAGNOSIS — D631 Anemia in chronic kidney disease: Secondary | ICD-10-CM | POA: Diagnosis not present

## 2020-05-26 DIAGNOSIS — N186 End stage renal disease: Secondary | ICD-10-CM | POA: Insufficient documentation

## 2020-05-26 DIAGNOSIS — Z992 Dependence on renal dialysis: Secondary | ICD-10-CM | POA: Diagnosis not present

## 2020-05-26 DIAGNOSIS — I700 Atherosclerosis of aorta: Secondary | ICD-10-CM | POA: Diagnosis not present

## 2020-05-26 DIAGNOSIS — J209 Acute bronchitis, unspecified: Secondary | ICD-10-CM | POA: Diagnosis not present

## 2020-05-26 DIAGNOSIS — Z23 Encounter for immunization: Secondary | ICD-10-CM | POA: Diagnosis not present

## 2020-05-26 LAB — COMPREHENSIVE METABOLIC PANEL
ALT: 12 U/L (ref 0–44)
AST: 20 U/L (ref 15–41)
Albumin: 2.8 g/dL — ABNORMAL LOW (ref 3.5–5.0)
Alkaline Phosphatase: 53 U/L (ref 38–126)
Anion gap: 12 (ref 5–15)
BUN: 23 mg/dL (ref 8–23)
CO2: 28 mmol/L (ref 22–32)
Calcium: 9.3 mg/dL (ref 8.9–10.3)
Chloride: 98 mmol/L (ref 98–111)
Creatinine, Ser: 7.67 mg/dL — ABNORMAL HIGH (ref 0.44–1.00)
GFR calc Af Amer: 6 mL/min — ABNORMAL LOW (ref >60)
GFR calc non Af Amer: 5 mL/min — ABNORMAL LOW (ref >60)
Glucose, Bld: 109 mg/dL — ABNORMAL HIGH (ref 70–99)
Potassium: 3.1 mmol/L — ABNORMAL LOW (ref 3.5–5.1)
Sodium: 138 mmol/L (ref 135–145)
Total Bilirubin: 0.5 mg/dL (ref 0.3–1.2)
Total Protein: 6.7 g/dL (ref 6.5–8.1)

## 2020-05-26 LAB — LIPASE, BLOOD: Lipase: 32 U/L (ref 11–51)

## 2020-05-26 LAB — CBC
HCT: 34.3 % — ABNORMAL LOW (ref 36.0–46.0)
Hemoglobin: 10.6 g/dL — ABNORMAL LOW (ref 12.0–15.0)
MCH: 24.3 pg — ABNORMAL LOW (ref 26.0–34.0)
MCHC: 30.9 g/dL (ref 30.0–36.0)
MCV: 78.5 fL — ABNORMAL LOW (ref 80.0–100.0)
Platelets: 342 10*3/uL (ref 150–400)
RBC: 4.37 MIL/uL (ref 3.87–5.11)
RDW: 18.6 % — ABNORMAL HIGH (ref 11.5–15.5)
WBC: 8.2 10*3/uL (ref 4.0–10.5)
nRBC: 0 % (ref 0.0–0.2)

## 2020-05-26 MED ORDER — LACTULOSE 20 GM/30ML PO SOLN: 30.0000 mL | Freq: Two times a day (BID) | ORAL | 0 refills | Status: DC

## 2020-05-26 NOTE — ED Provider Notes (Signed)
[facility] EMERGENCY DEPARTMENT Provider Note   CSN: [id number] Arrival date & time: 05/26/20  8756     History Chief Complaint  Patient presents with  . Constipation    SUKARI GRIST is a 64 y.o. female with PMHx ESRD on Dialysis M W F, HTN (rcently restarted on Hydralazine), GERD who presents to the ED today with complaint of constipation x 5 days. Pt reports she has been passing small pelleted stools for the past 5 days which is abnormal for her. She states that she vomited twice 2 days ago however none since then. She has been able to eat and drink without difficulty however is concerned due to not being able to have a proper BM. Pt reports she is still passing gas. She went to UC today and was prescribed Lactulose. She states she requested a disimpaction however they told her they do not do that at [facility] and that she would need to come to the ED for that. Pt reports she had a disimpaction 8-9 years ago and feels she needs one again. She tried an OTC stool softener and enema at home as well as took miralax for two days in a row without relief. Pt denies fevers, chills, nausea, blood in stool, obstipation, urinary sx, or any other associated symptoms.   The history is provided by the patient and medical records.       Past Medical History:  Diagnosis Date  . Chronic bronchitis ([place])   . Complication of anesthesia ~ 2011   "they gave me a medicine that swolled me and mouth burning up" (08/23/2012)  . Complication of anesthesia    slow to wake up  . ESRD (end stage renal disease) on dialysis [facility]) Nephrologist-- dr deterding   ESRD due to HTN-; "M/W/F; [place]" (11/28/2015  . GERD (gastroesophageal reflux disease)   . History of acute pulmonary edema    2003  . Hypertension    no longer on medications  . Left patella fracture   . Pneumonia    as a child  . Seasonal allergies   . Secondary hyperparathyroidism, renal ([place])    s/p  total parathyroidectomy   2014  . Thyroid disease   . Wears glasses     Patient Active Problem List   Diagnosis Date Noted  . Dehiscence of operative wound 11/28/2015  . Wound dehiscence, surgical 11/28/2015  . Renal dialysis device, implant, or graft complication 43/32/9518  . Aftercare following surgery of the circulatory system, [place] 03/30/2013  . Mechanical complication of other vascular device, implant, and graft 01/12/2013  . Other and unspecified hyperlipidemia 10/25/2012  . End stage renal disease ([place]) 09/22/2012  . Chest pain, mid sternal 08/23/2012  . Hyperparathyroidism, secondary renal ([place]) 03/22/2011  . Thyroid nodule 03/22/2011  . High blood pressure 03/10/2011  . Kidney disease 03/10/2011    Past Surgical History:  Procedure Laterality Date  . ANKLE FRACTURE SURGERY Right ~ 2005   "has pins in it"  . APPLICATION OF WOUND VAC Left 11/28/2015   knee  . APPLICATION OF WOUND VAC Left 11/28/2015   Procedure: APPLICATION OF WOUND VAC;  Surgeon: Rod Can, MD;  Location: [place];  Service: Orthopedics;  Laterality: Left;  . ARTERIOVENOUS GRAFT PLACEMENT  03-25-2005   Right forearm  w/  multiple Revision's   . CARDIOVASCULAR STRESS TEST  08-24-2012   abnormal nuclear study/  inferolateral and anteroseptal areas of scar,  no ischemia/  normal LV function and wall motion,  ef 77%  . DIALYSIS FISTULA CREATION  1992   left upper arm ---  w/  Multiple Revision's until 2006  . ECTOPIC PREGNANCY SURGERY  1970's  . FRACTURE SURGERY    . I & D EXTREMITY Left 11/28/2015   Procedure: IRRIGATION AND DEBRIDEMENT LEFT KNEE WOUND ;  Surgeon: Rod Can, MD;  Location: [place];  Service: Orthopedics;  Laterality: Left;  . INCISION AND DRAINAGE OF WOUND Left 11/28/2015   knee  . ORIF PATELLA Left 10/31/2015   Procedure: OPEN REDUCTION INTERNAL (ORIF) FIXATION LEFT PATELLA;  Surgeon: Rod Can, MD;  Location: [place];  Service: Orthopedics;  Laterality: Left;  . PATELLAR TENDON REPAIR   10/03/2012   Procedure: PATELLA TENDON REPAIR;  Surgeon: Marin Shutter, MD;  Location: [place];  Service: Orthopedics;  Laterality: Right;  . PATELLECTOMY  10/03/2012   Procedure: PATELLECTOMY;  Surgeon: Marin Shutter, MD;  Location: [place];  Service: Orthopedics;  Laterality: Right;  RIGHT PARTIAL PATELLECTOMY AND PATELLA TENDON REPAIR  . REVISION OF ARTERIOVENOUS GORETEX GRAFT  09/27/2012   Procedure: REVISION OF ARTERIOVENOUS GORETEX GRAFT;  Surgeon: Mal Misty, MD;  Location: [place];  Service: Vascular;  Laterality: Right;  1) Replacement of venous half of loop with 15mm Gortex graft  2) Excision of erroded pseudoaneurysm of graft with primary closure.  Marland Kitchen REVISION OF ARTERIOVENOUS GORETEX GRAFT Right 01/23/2013   Procedure: REVISION OF ARTERIOVENOUS GORETEX GRAFT;  Surgeon: Conrad Beedeville, MD;  Location: [facility] OR;  Service: Vascular;  Laterality: Right;  Using piece of 60mm x 20cm Gortex graft.   Marland Kitchen REVISION OF ARTERIOVENOUS GORETEX GRAFT Right 10/07/2015   Procedure: REVISION OF Right arm ARTERIOVENOUS GORETEX GRAFT;  Surgeon: Elam Dutch, MD;  Location: [facility];  Service: Vascular;  Laterality: Right;  . REVISION OF ARTERIOVENOUS GORETEX GRAFT Right 02/17/2016   Procedure: REVISION OF ARTERIOVENOUS GORETEX GRAFT;  Surgeon: Elam Dutch, MD;  Location: [place];  Service: Vascular;  Laterality: Right;  . TOTAL ABDOMINAL HYSTERECTOMY  03-05-2005   w/  Right Ovarian Cystectomy  . TOTAL PARATHYROIDECTOMY/  THYROID ISTHMUSECTOMY/  AUTOTRANSPLANTATION PARATHYROID TISSUE TO LEFT BRACHIORIADIALIS MUSCLE  02-18-2011  . TRANSTHORACIC ECHOCARDIOGRAM  10-31-2012   mild LVH,  grade 1 diastolic dysfunction,  ef 55-65%/  mild MR/  trivial TR     OB History   No obstetric history on file.     Family History  Problem Relation Age of Onset  . Healthy Mother   . Hypertension Father   . Kidney failure Father   . Hypertension Sister   . Thyroid disease Sister     Social History   Tobacco Use  . Smoking  status: Never Smoker  . Smokeless tobacco: Never Used  Vaping Use  . Vaping Use: Never used  Substance Use Topics  . Alcohol use: No    Alcohol/week: 0.0 standard drinks    Comment: quit 2007  . Drug use: No    Home Medications Prior to Admission medications   Medication Sig Start Date End Date Taking? Authorizing Provider  fluticasone (FLONASE) 50 MCG/ACT nasal spray Place 2 sprays into both nostrils daily.    [provider]  hydrALAZINE (APRESOLINE) 25 MG tablet Take 1 tablet (25 mg total) by mouth in the morning and at bedtime. 05/21/20   Drenda Freeze, MD  HYDROcodone-acetaminophen (NORCO/VICODIN) 5-325 MG tablet Take 1 tablet by mouth every 6 (six) hours as needed for moderate pain. 03/28/20   Milton Ferguson,  MD  Lactulose 20 GM/30ML SOLN Take 30 mLs (20 g total) by mouth in the morning and at bedtime. 05/26/20   Loura Halt A, NP  methocarbamol (ROBAXIN) 500 MG tablet Take 500 mg by mouth 2 (two) times daily. 03/11/20   [provider]  ondansetron (ZOFRAN) 4 MG tablet Take 1 tablet (4 mg total) by mouth every 8 (eight) hours as needed for nausea or vomiting. Patient not taking: Reported on 02/26/2020 03/01/19   Domenic Moras, PA-C    Allergies    Amlodipine, Cefazolin, Cephalosporins, and Lisinopril  Review of Systems   Review of Systems  Constitutional: Negative for chills and fever.  Gastrointestinal: Positive for abdominal distention, constipation and vomiting (resolved). Negative for abdominal pain, diarrhea and nausea.  All other systems reviewed and are negative.   Physical Exam Updated Vital Signs BP (!) 158/97   Pulse 87   Resp 17   SpO2 98%   Physical Exam Vitals and nursing note reviewed.  Constitutional:      Appearance: She is obese. She is not ill-appearing or diaphoretic.  HENT:     Head: Normocephalic and atraumatic.  Eyes:     Conjunctiva/sclera: Conjunctivae normal.  Cardiovascular:     Rate and Rhythm: Normal rate and regular  rhythm.     Pulses: Normal pulses.  Pulmonary:     Effort: Pulmonary effort is normal.     Breath sounds: Normal breath sounds. No wheezing, rhonchi or rales.  Abdominal:     General: Abdomen is flat. Bowel sounds are normal.     Palpations: Abdomen is soft.     Tenderness: There is no abdominal tenderness. There is no guarding or rebound.  Musculoskeletal:     Cervical back: Neck supple.     Comments: Nonpitting edema bilaterally  Skin:    General: Skin is warm and dry.  Neurological:     Mental Status: She is alert.     ED Results / Procedures / Treatments   Labs (all labs ordered are listed, but only abnormal results are displayed) Labs Reviewed  COMPREHENSIVE METABOLIC PANEL - Abnormal; Notable for the following components:      Result Value   Potassium 3.1 (*)    Glucose, Bld 109 (*)    Creatinine, Ser 7.67 (*)    Albumin 2.8 (*)    GFR calc non Af Amer 5 (*)    GFR calc Af Amer 6 (*)    All other components within normal limits  CBC - Abnormal; Notable for the following components:   Hemoglobin 10.6 (*)    HCT 34.3 (*)    MCV 78.5 (*)    MCH 24.3 (*)    RDW 18.6 (*)    All other components within normal limits  LIPASE, BLOOD  URINALYSIS, ROUTINE W REFLEX MICROSCOPIC    EKG None  Radiology DG Abd Acute W/Chest  Result Date: 05/26/2020 CLINICAL DATA:  Constipation. EXAM: DG ABDOMEN ACUTE W/ 1V CHEST COMPARISON:  Chest radiographs dated 03/19/2020. Chest and abdomen radiographs dated 10/17/2015. FINDINGS: Stable mildly enlarged cardiac silhouette and tortuous and partially calcified thoracic aorta. Stable elevation of the left hemidiaphragm with a small amount of linear atelectasis or scarring at the left lung base. Stable mild diffuse peribronchial thickening and accentuation of the interstitial markings. Normal bowel gas pattern without free peritoneal air. Small, normal amount of stool. Mild levoconvex thoracolumbar scoliosis. Surgical clips at the thoracic inlet  on the left. IMPRESSION: 1. No acute abnormality. 2. Stable mild cardiomegaly  and mild chronic bronchitic changes. 3. Stable elevation of the left hemidiaphragm. Electronically Signed   By: Claudie Revering M.D.   On: 05/26/2020 13:38    Procedures Procedures (including critical care time)  Medications Ordered in ED Medications - No data to display  ED Course  I have reviewed the triage vital signs and the nursing notes.  Pertinent labs & imaging results that were available during my care of the patient were reviewed by me and considered in my medical decision making (see chart for details).    MDM Rules/Calculators/A&P                          64 year old female presents to the ED today complaining of constipation for the past 5 days.  She went to urgent care today and was prescribed lactulose and told to come to the ED if she wanted a disimpaction as they do not do it there.  On arrival to the ED patient's vitals are stable.  She is nontachycardic nontachypneic.  She has no abdominal TTP on exam. She is passing gas. Doubt SBO. Lab work was obtained while patient was in the waiting room.  White blood cell count without leukocytosis.  Hemoglobin stable at 10.6.  CMP with a potassium of 3.1 which appears to be around patient's baseline.  Creatinine 7.67 today.  She did have dialysis earlier this morning however states she only got 3 1.5 hours of it as she was having discomfort in her abdomen. Lipase 32.   Will plan for acute abdominal series at this time to assess for true fecal impaction. If any evidence on xray will plan for disimpaction if medically necessary.   Xray with "small, normal amount of stool" however per my read does appear to show a lot of stool. DRE was performed with chaperone at  Bedside; I am able to feel small pelleted stools however unable to reach them for disimpaction. Will order enema at this time as pt does appaer slightly uncomfortable. Have discussed with attending  physician Dr. Tamera Punt who has evaluated patient and agrees with plan.   Pt has had a successful bowel movement after soap suds enema. Will discharge home at this time. She has been provided lactulose at UC this morning; advised to pick up and take as needed. Have encouraged increasing oral intake and miralax daily. She has a PCP appointment this wendesday; advised to keep. Strict return precautions discussed; she is in agreement with plan and stable for discharge home.   This note was prepared using Dragon voice recognition software and may include unintentional dictation errors due to the inherent limitations of voice recognition software.  Final Clinical Impression(s) / ED Diagnoses Final diagnoses:  Constipation, unspecified constipation type    Rx / DC Orders ED Discharge Orders    None       Discharge Instructions     Please follow up with your PCP as scheduled for this Wednesday.  Pick up the Lactulose prescribed at urgent care this morning and take as prescribed I would recommend increasing the water in your diet and taking Miralax daily.  Return to the ED IMMEDIATELY for any worsening symptoms including worsening pain, inability to have a good BM at home, vomiting, fevers > 100.4, not passing gas, or any other associated symptoms.        Eustaquio Maize, PA-C 05/26/20 1541    Malvin Johns, MD 05/26/20 [id number]

## 2020-05-26 NOTE — ED Notes (Signed)
Pt d/c home per MD order. Discharge summary reviewed with pt, pt verbalizes understanding. Ambulatory off unit. No s/s of acute distress noted.  

## 2020-05-26 NOTE — Discharge Instructions (Addendum)
Please follow up with your PCP as scheduled for this Wednesday.  Pick up the Lactulose prescribed at urgent care this morning and take as prescribed I would recommend increasing the water in your diet and taking Miralax daily.  Return to the ED IMMEDIATELY for any worsening symptoms including worsening pain, inability to have a good BM at home, vomiting, fevers > 100.4, not passing gas, or any other associated symptoms.

## 2020-05-26 NOTE — ED Triage Notes (Signed)
Pt presents with constipation, states she has not had a BM in 5 days. C/o feet swelling and abdominal pain xs 5 days.   States used enema last week with no relief.

## 2020-05-26 NOTE — ED Triage Notes (Signed)
Pt here from urgent care for further eval of constipation. Was given a rx at [facility] this morning but was told she needed to come here for disimpaction. Endorses bloating and abdominal pain.

## 2020-05-26 NOTE — ED Notes (Signed)
Pt encouraged to provide urine sample, informs nurse she does not make urine.

## 2020-05-26 NOTE — Discharge Instructions (Signed)
Take the lactulose as prescribed for constipation. Make sure you are drinking water Follow up as needed for continued or worsening symptoms

## 2020-05-26 NOTE — ED Notes (Signed)
Pt transported to xray 

## 2020-05-28 DIAGNOSIS — Z992 Dependence on renal dialysis: Secondary | ICD-10-CM | POA: Diagnosis not present

## 2020-05-28 DIAGNOSIS — D509 Iron deficiency anemia, unspecified: Secondary | ICD-10-CM | POA: Diagnosis not present

## 2020-05-28 DIAGNOSIS — D631 Anemia in chronic kidney disease: Secondary | ICD-10-CM | POA: Diagnosis not present

## 2020-05-28 DIAGNOSIS — N186 End stage renal disease: Secondary | ICD-10-CM | POA: Diagnosis not present

## 2020-05-28 DIAGNOSIS — Z23 Encounter for immunization: Secondary | ICD-10-CM | POA: Diagnosis not present

## 2020-05-28 NOTE — ED Provider Notes (Signed)
[name]    CSN: [id number] Arrival date & time: 05/26/20  0803      History   Chief Complaint Chief Complaint  Patient presents with  . Constipation    HPI Victoria [name] is a 64 y.o. female.   Patient is a 64 year old female with past medical history of end-stage renal disease on dialysis, chronic bronchitis, hypertension, thyroid disease.  She presents today with approximate 5 days of constipation.  Has been using laxative and tried enema without any relief.  No nausea, vomiting or diarrhea.  Eating and drinking normally.  Passing gas normally.  No blood in stool.     Past Medical History:  Diagnosis Date  . Chronic bronchitis ([place])   . Complication of anesthesia ~ 2011   "they gave me a medicine that swolled me and mouth burning up" (08/23/2012)  . Complication of anesthesia    slow to wake up  . ESRD (end stage renal disease) on dialysis [facility]) Nephrologist-- dr deterding   ESRD due to HTN-; "M/W/F; [place]" (11/28/2015  . GERD (gastroesophageal reflux disease)   . History of acute pulmonary edema    2003  . Hypertension    no longer on medications  . Left patella fracture   . Pneumonia    as a child  . Seasonal allergies   . Secondary hyperparathyroidism, renal ([place])    s/p  total parathyroidectomy  2014  . Thyroid disease   . Wears glasses     Patient Active Problem List   Diagnosis Date Noted  . Dehiscence of operative wound 11/28/2015  . Wound dehiscence, surgical 11/28/2015  . Renal dialysis device, implant, or graft complication 40/98/1191  . Aftercare following surgery of the circulatory system, [place] 03/30/2013  . Mechanical complication of other vascular device, implant, and graft 01/12/2013  . Other and unspecified hyperlipidemia 10/25/2012  . End stage renal disease ([place]) 09/22/2012  . Chest pain, mid sternal 08/23/2012  . Hyperparathyroidism, secondary renal ([place]) 03/22/2011  . Thyroid nodule 03/22/2011  . High blood pressure  03/10/2011  . Kidney disease 03/10/2011    Past Surgical History:  Procedure Laterality Date  . ANKLE FRACTURE SURGERY Right ~ 2005   "has pins in it"  . APPLICATION OF WOUND VAC Left 11/28/2015   knee  . APPLICATION OF WOUND VAC Left 11/28/2015   Procedure: APPLICATION OF WOUND VAC;  Surgeon: Rod Can, MD;  Location: [place];  Service: Orthopedics;  Laterality: Left;  . ARTERIOVENOUS GRAFT PLACEMENT  03-25-2005   Right forearm  w/  multiple Revision's   . CARDIOVASCULAR STRESS TEST  08-24-2012   abnormal nuclear study/  inferolateral and anteroseptal areas of scar,  no ischemia/  normal LV function and wall motion, ef 77%  . DIALYSIS FISTULA CREATION  1992   left upper arm ---  w/  Multiple Revision's until 2006  . ECTOPIC PREGNANCY SURGERY  1970's  . FRACTURE SURGERY    . I & D EXTREMITY Left 11/28/2015   Procedure: IRRIGATION AND DEBRIDEMENT LEFT KNEE WOUND ;  Surgeon: Rod Can, MD;  Location: [facility];  Service: Orthopedics;  Laterality: Left;  . INCISION AND DRAINAGE OF WOUND Left 11/28/2015   knee  . ORIF PATELLA Left 10/31/2015   Procedure: OPEN REDUCTION INTERNAL (ORIF) FIXATION LEFT PATELLA;  Surgeon: Rod Can, MD;  Location: [facility];  Service: Orthopedics;  Laterality: Left;  . PATELLAR TENDON REPAIR  10/03/2012   Procedure: PATELLA TENDON REPAIR;  Surgeon: Metta Clines  Supple, MD;  Location: [facility];  Service: Orthopedics;  Laterality: Right;  . PATELLECTOMY  10/03/2012   Procedure: PATELLECTOMY;  Surgeon: Marin Shutter, MD;  Location: [place];  Service: Orthopedics;  Laterality: Right;  RIGHT PARTIAL PATELLECTOMY AND PATELLA TENDON REPAIR  . REVISION OF ARTERIOVENOUS GORETEX GRAFT  09/27/2012   Procedure: REVISION OF ARTERIOVENOUS GORETEX GRAFT;  Surgeon: Mal Misty, MD;  Location: [facility];  Service: Vascular;  Laterality: Right;  1) Replacement of venous half of loop with 20mm Gortex graft  2) Excision of erroded pseudoaneurysm of graft with primary  closure.  Marland Kitchen REVISION OF ARTERIOVENOUS GORETEX GRAFT Right 01/23/2013   Procedure: REVISION OF ARTERIOVENOUS GORETEX GRAFT;  Surgeon: Conrad Bramwell, MD;  Location: [facility] OR;  Service: Vascular;  Laterality: Right;  Using piece of 77mm x 20cm Gortex graft.   Marland Kitchen REVISION OF ARTERIOVENOUS GORETEX GRAFT Right 10/07/2015   Procedure: REVISION OF Right arm ARTERIOVENOUS GORETEX GRAFT;  Surgeon: Elam Dutch, MD;  Location: [facility] OR;  Service: Vascular;  Laterality: Right;  . REVISION OF ARTERIOVENOUS GORETEX GRAFT Right 02/17/2016   Procedure: REVISION OF ARTERIOVENOUS GORETEX GRAFT;  Surgeon: Elam Dutch, MD;  Location: [place];  Service: Vascular;  Laterality: Right;  . TOTAL ABDOMINAL HYSTERECTOMY  03-05-2005   w/  Right Ovarian Cystectomy  . TOTAL PARATHYROIDECTOMY/  THYROID ISTHMUSECTOMY/  AUTOTRANSPLANTATION PARATHYROID TISSUE TO LEFT BRACHIORIADIALIS MUSCLE  02-18-2011  . TRANSTHORACIC ECHOCARDIOGRAM  10-31-2012   mild LVH,  grade 1 diastolic dysfunction,  ef 55-65%/  mild MR/  trivial TR    OB History   No obstetric history on file.      Home Medications    Prior to Admission medications   Medication Sig Start Date End Date Taking? Authorizing Provider  fluticasone (FLONASE) 50 MCG/ACT nasal spray Place 2 sprays into both nostrils daily.    [provider]  hydrALAZINE (APRESOLINE) 25 MG tablet Take 1 tablet (25 mg total) by mouth in the morning and at bedtime. 05/21/20   Drenda Freeze, MD  HYDROcodone-acetaminophen (NORCO/VICODIN) 5-325 MG tablet Take 1 tablet by mouth every 6 (six) hours as needed for moderate pain. 03/28/20   Victoria Ferguson, MD  Lactulose 20 GM/30ML SOLN Take 30 mLs (20 g total) by mouth in the morning and at bedtime. 05/26/20   Loura Halt A, NP  methocarbamol (ROBAXIN) 500 MG tablet Take 500 mg by mouth 2 (two) times daily. 03/11/20   [provider]  ondansetron (ZOFRAN) 4 MG tablet Take 1 tablet (4 mg total) by mouth every 8 (eight) hours as needed for  nausea or vomiting. Patient not taking: Reported on 02/26/2020 03/01/19   Domenic Moras, PA-C    Family History Family History  Problem Relation Age of Onset  . Healthy Mother   . Hypertension Father   . Kidney failure Father   . Hypertension Sister   . Thyroid disease Sister     Social History Social History   Tobacco Use  . Smoking status: Never Smoker  . Smokeless tobacco: Never Used  Vaping Use  . Vaping Use: Never used  Substance Use Topics  . Alcohol use: No    Alcohol/week: 0.0 standard drinks    Comment: quit 2007  . Drug use: No     Allergies   Amlodipine, Cefazolin, Cephalosporins, and Lisinopril   Review of Systems Review of Systems   Physical Exam Triage Vital Signs ED Triage Vitals  Enc Vitals Group     BP  05/26/20 0827 (!) 155/80     Pulse Rate 05/26/20 0827 81     Resp 05/26/20 0827 18     Temp 05/26/20 0827 97.8 F (36.6 C)     Temp Source 05/26/20 0827 Oral     SpO2 05/26/20 0827 100 %     Weight --      Height --      Head Circumference --      Peak Flow --      Pain Score 05/26/20 0826 10     Pain Loc --      Pain Edu? --      Excl. in [place]? --    No data found.  Updated Vital Signs BP (!) 155/80 (BP Location: Left Arm)   Pulse 81   Temp 97.8 F (36.6 C) (Oral)   Resp 18   SpO2 100%   Visual Acuity Right Eye Distance:   Left Eye Distance:   Bilateral Distance:    Right Eye Near:   Left Eye Near:    Bilateral Near:     Physical Exam Vitals and nursing note reviewed.  Constitutional:      General: She is not in acute distress.    Appearance: Normal appearance. She is not ill-appearing, toxic-appearing or diaphoretic.  HENT:     Head: Normocephalic.     Nose: Nose normal.  Eyes:     Conjunctiva/sclera: Conjunctivae normal.  Pulmonary:     Effort: Pulmonary effort is normal.  Abdominal:     Palpations: Abdomen is soft.     Tenderness: There is no abdominal tenderness.  Musculoskeletal:        General: Normal range of  motion.     Cervical back: Normal range of motion.  Skin:    General: Skin is warm and dry.     Findings: No rash.  Neurological:     Mental Status: She is alert.  Psychiatric:        Mood and Affect: Mood normal.      UC Treatments / Results  Labs (all labs ordered are listed, but only abnormal results are displayed) Labs Reviewed - No data to display  EKG   Radiology DG Abd Acute W/Chest  Result Date: 05/26/2020 CLINICAL DATA:  Constipation. EXAM: DG ABDOMEN ACUTE W/ 1V CHEST COMPARISON:  Chest radiographs dated 03/19/2020. Chest and abdomen radiographs dated 10/17/2015. FINDINGS: Stable mildly enlarged cardiac silhouette and tortuous and partially calcified thoracic aorta. Stable elevation of the left hemidiaphragm with a small amount of linear atelectasis or scarring at the left lung base. Stable mild diffuse peribronchial thickening and accentuation of the interstitial markings. Normal bowel gas pattern without free peritoneal air. Small, normal amount of stool. Mild levoconvex thoracolumbar scoliosis. Surgical clips at the thoracic inlet on the left. IMPRESSION: 1. No acute abnormality. 2. Stable mild cardiomegaly and mild chronic bronchitic changes. 3. Stable elevation of the left hemidiaphragm. Electronically Signed   By: Claudie Revering M.D.   On: 05/26/2020 13:38    Procedures Procedures (including critical care time)  Medications Ordered in UC Medications - No data to display  Initial Impression / Assessment and Plan / UC Course  I have reviewed the triage vital signs and the nursing notes.  Pertinent labs & imaging results that were available during my care of the patient were reviewed by me and considered in my medical decision making (see chart for details).     Constipation We will try lactulose  Patient requesting disimpaction.  Recommended  go to the ER for this if her symptoms persist  Final Clinical Impressions(s) / UC Diagnoses   Final diagnoses:    Constipation, unspecified constipation type     Discharge Instructions     Take the lactulose as prescribed for constipation. Make sure you are drinking water Follow up as needed for continued or worsening symptoms     ED Prescriptions    Medication Sig Dispense Auth. Provider   Lactulose 20 GM/30ML SOLN Take 30 mLs (20 g total) by mouth in the morning and at bedtime. 236 mL Velencia Lenart A, NP     PDMP not reviewed this encounter.   Loura Halt A, NP 05/28/20 1051

## 2020-05-30 ENCOUNTER — Emergency Department (HOSPITAL_COMMUNITY): Payer: Medicare Other

## 2020-05-30 ENCOUNTER — Emergency Department (HOSPITAL_BASED_OUTPATIENT_CLINIC_OR_DEPARTMENT_OTHER): Payer: Medicare Other

## 2020-05-30 ENCOUNTER — Encounter (HOSPITAL_COMMUNITY): Payer: Self-pay | Admitting: *Deleted

## 2020-05-30 ENCOUNTER — Emergency Department (HOSPITAL_COMMUNITY)
Admission: EM | Admit: 2020-05-30 | Discharge: 2020-05-30 | Disposition: A | Discharge status: Home / Self Care | Payer: Medicare Other | Attending: Emergency Medicine | Admitting: Emergency Medicine

## 2020-05-30 DIAGNOSIS — M79609 Pain in unspecified limb: Secondary | ICD-10-CM

## 2020-05-30 DIAGNOSIS — M7989 Other specified soft tissue disorders: Secondary | ICD-10-CM

## 2020-05-30 DIAGNOSIS — R110 Nausea: Secondary | ICD-10-CM | POA: Diagnosis not present

## 2020-05-30 DIAGNOSIS — N186 End stage renal disease: Secondary | ICD-10-CM | POA: Diagnosis not present

## 2020-05-30 DIAGNOSIS — I120 Hypertensive chronic kidney disease with stage 5 chronic kidney disease or end stage renal disease: Secondary | ICD-10-CM | POA: Diagnosis not present

## 2020-05-30 DIAGNOSIS — R112 Nausea with vomiting, unspecified: Secondary | ICD-10-CM | POA: Diagnosis not present

## 2020-05-30 DIAGNOSIS — D631 Anemia in chronic kidney disease: Secondary | ICD-10-CM | POA: Diagnosis not present

## 2020-05-30 DIAGNOSIS — Z20822 Contact with and (suspected) exposure to covid-19: Secondary | ICD-10-CM | POA: Insufficient documentation

## 2020-05-30 DIAGNOSIS — M25561 Pain in right knee: Secondary | ICD-10-CM | POA: Diagnosis not present

## 2020-05-30 DIAGNOSIS — M1711 Unilateral primary osteoarthritis, right knee: Secondary | ICD-10-CM | POA: Diagnosis not present

## 2020-05-30 DIAGNOSIS — R609 Edema, unspecified: Secondary | ICD-10-CM | POA: Diagnosis not present

## 2020-05-30 DIAGNOSIS — G8929 Other chronic pain: Secondary | ICD-10-CM | POA: Diagnosis not present

## 2020-05-30 DIAGNOSIS — Z23 Encounter for immunization: Secondary | ICD-10-CM | POA: Diagnosis not present

## 2020-05-30 DIAGNOSIS — Z992 Dependence on renal dialysis: Secondary | ICD-10-CM | POA: Diagnosis not present

## 2020-05-30 DIAGNOSIS — I10 Essential (primary) hypertension: Secondary | ICD-10-CM | POA: Diagnosis not present

## 2020-05-30 DIAGNOSIS — R1110 Vomiting, unspecified: Secondary | ICD-10-CM

## 2020-05-30 DIAGNOSIS — R52 Pain, unspecified: Secondary | ICD-10-CM | POA: Diagnosis not present

## 2020-05-30 DIAGNOSIS — D509 Iron deficiency anemia, unspecified: Secondary | ICD-10-CM | POA: Diagnosis not present

## 2020-05-30 LAB — COMPREHENSIVE METABOLIC PANEL
ALT: 11 U/L (ref 0–44)
AST: 17 U/L (ref 15–41)
Albumin: 3.1 g/dL — ABNORMAL LOW (ref 3.5–5.0)
Alkaline Phosphatase: 56 U/L (ref 38–126)
Anion gap: 14 (ref 5–15)
BUN: 20 mg/dL (ref 8–23)
CO2: 28 mmol/L (ref 22–32)
Calcium: 9.6 mg/dL (ref 8.9–10.3)
Chloride: 97 mmol/L — ABNORMAL LOW (ref 98–111)
Creatinine, Ser: 6.92 mg/dL — ABNORMAL HIGH (ref 0.44–1.00)
GFR calc Af Amer: 7 mL/min — ABNORMAL LOW (ref >60)
GFR calc non Af Amer: 6 mL/min — ABNORMAL LOW (ref >60)
Glucose, Bld: 96 mg/dL (ref 70–99)
Potassium: 3.3 mmol/L — ABNORMAL LOW (ref 3.5–5.1)
Sodium: 139 mmol/L (ref 135–145)
Total Bilirubin: 0.6 mg/dL (ref 0.3–1.2)
Total Protein: 7.1 g/dL (ref 6.5–8.1)

## 2020-05-30 LAB — CBC WITH DIFFERENTIAL/PLATELET
Abs Immature Granulocytes: 0.04 10*3/uL (ref 0.00–0.07)
Basophils Absolute: 0.1 10*3/uL (ref 0.0–0.1)
Basophils Relative: 1 %
Eosinophils Absolute: 0.1 10*3/uL (ref 0.0–0.5)
Eosinophils Relative: 1 %
HCT: 38.4 % (ref 36.0–46.0)
Hemoglobin: 11.8 g/dL — ABNORMAL LOW (ref 12.0–15.0)
Immature Granulocytes: 1 %
Lymphocytes Relative: 14 %
Lymphs Abs: 1.1 10*3/uL (ref 0.7–4.0)
MCH: 24.1 pg — ABNORMAL LOW (ref 26.0–34.0)
MCHC: 30.7 g/dL (ref 30.0–36.0)
MCV: 78.4 fL — ABNORMAL LOW (ref 80.0–100.0)
Monocytes Absolute: 0.8 10*3/uL (ref 0.1–1.0)
Monocytes Relative: 10 %
Neutro Abs: 5.8 10*3/uL (ref 1.7–7.7)
Neutrophils Relative %: 73 %
Platelets: 363 10*3/uL (ref 150–400)
RBC: 4.9 MIL/uL (ref 3.87–5.11)
RDW: 18.9 % — ABNORMAL HIGH (ref 11.5–15.5)
WBC: 8 10*3/uL (ref 4.0–10.5)
nRBC: 0 % (ref 0.0–0.2)

## 2020-05-30 LAB — SARS CORONAVIRUS 2 BY RT PCR (HOSPITAL ORDER, PERFORMED IN ~~LOC~~ HOSPITAL LAB): SARS Coronavirus 2: NEGATIVE

## 2020-05-30 LAB — LIPASE, BLOOD: Lipase: 35 U/L (ref 11–51)

## 2020-05-30 MED ORDER — HYDROCODONE-ACETAMINOPHEN 5-325 MG PO TABS: 1.0000 | ORAL_TABLET | Freq: Four times a day (QID) | ORAL | 0 refills | Status: DC | PRN

## 2020-05-30 MED ORDER — HYDROCODONE-ACETAMINOPHEN 7.5-325 MG/15ML PO SOLN
10.0000 mL | Freq: Once | ORAL | Status: AC
  Administered 2020-05-30: 10 mL via ORAL
  Filled 2020-05-30: qty 15

## 2020-05-30 MED ORDER — ONDANSETRON 4 MG PO TBDP: 4.0000 mg | ORAL_TABLET | Freq: Three times a day (TID) | ORAL | 0 refills | Status: DC | PRN

## 2020-05-30 NOTE — ED Triage Notes (Signed)
To ED via [facility] for eval of right leg pain. Pt was at dialysis. States she had approx 2 hrs of her 4 hr treatment. Stopped due to pt have pain. Pt states this pain has been ongoing and she feels its due to her not having a steriod shot that she frequently receives. No injury. Pulses palpable in right pedal. Hx of fracture to right leg - 18 yrs ago.

## 2020-05-30 NOTE — Progress Notes (Signed)
Right lower Ext. study completed.   See CVProc for preliminary results.   Griffin Basil, RDMS, RVT

## 2020-05-30 NOTE — ED Provider Notes (Addendum)
[place] EMERGENCY DEPARTMENT Provider Note   CSN: [id number] Arrival date & time: 05/30/20  0749     History Chief Complaint  Patient presents with  . Leg Pain    Victoria [name] is a 64 y.o. female.   Leg Pain Location:  Knee Knee location:  R knee Pain details:    Quality:  Aching   Radiates to: ankle.   Severity:  Severe   Onset quality:  Gradual   Timing:  Constant   Progression:  Worsening Chronicity:  Chronic Dislocation: no   Foreign body present:  No foreign bodies Tetanus status:  Out of date Prior injury to area:  Yes Relieved by:  Nothing Worsened by:  Bearing weight Ineffective treatments:  None tried Associated symptoms: swelling   Associated symptoms: no fever        Past Medical History:  Diagnosis Date  . Chronic bronchitis ([place])   . Complication of anesthesia ~ 2011   "they gave me a medicine that swolled me and mouth burning up" (08/23/2012)  . Complication of anesthesia    slow to wake up  . ESRD (end stage renal disease) on dialysis [facility]) Nephrologist-- dr deterding   ESRD due to HTN-; "M/W/F; [place]" (11/28/2015  . GERD (gastroesophageal reflux disease)   . History of acute pulmonary edema    2003  . Hypertension    no longer on medications  . Left patella fracture   . Pneumonia    as a child  . Seasonal allergies   . Secondary hyperparathyroidism, renal ([place])    s/p  total parathyroidectomy  2014  . Thyroid disease   . Wears glasses     Patient Active Problem List   Diagnosis Date Noted  . Dehiscence of operative wound 11/28/2015  . Wound dehiscence, surgical 11/28/2015  . Renal dialysis device, implant, or graft complication 03/47/4259  . Aftercare following surgery of the circulatory system, [place] 03/30/2013  . Mechanical complication of other vascular device, implant, and graft 01/12/2013  . Other and unspecified hyperlipidemia 10/25/2012  . End stage renal disease ([place]) 09/22/2012  . Chest pain,  mid sternal 08/23/2012  . Hyperparathyroidism, secondary renal ([place]) 03/22/2011  . Thyroid nodule 03/22/2011  . High blood pressure 03/10/2011  . Kidney disease 03/10/2011    Past Surgical History:  Procedure Laterality Date  . ANKLE FRACTURE SURGERY Right ~ 2005   "has pins in it"  . APPLICATION OF WOUND VAC Left 11/28/2015   knee  . APPLICATION OF WOUND VAC Left 11/28/2015   Procedure: APPLICATION OF WOUND VAC;  Surgeon: Rod Can, MD;  Location: [place];  Service: Orthopedics;  Laterality: Left;  . ARTERIOVENOUS GRAFT PLACEMENT  03-25-2005   Right forearm  w/  multiple Revision's   . CARDIOVASCULAR STRESS TEST  08-24-2012   abnormal nuclear study/  inferolateral and anteroseptal areas of scar,  no ischemia/  normal LV function and wall motion, ef 77%  . DIALYSIS FISTULA CREATION  1992   left upper arm ---  w/  Multiple Revision's until 2006  . ECTOPIC PREGNANCY SURGERY  1970's  . FRACTURE SURGERY    . I & D EXTREMITY Left 11/28/2015   Procedure: IRRIGATION AND DEBRIDEMENT LEFT KNEE WOUND ;  Surgeon: Rod Can, MD;  Location: [place];  Service: Orthopedics;  Laterality: Left;  . INCISION AND DRAINAGE OF WOUND Left 11/28/2015   knee  . ORIF PATELLA Left 10/31/2015   Procedure: OPEN REDUCTION INTERNAL (ORIF) FIXATION LEFT PATELLA;  Surgeon: Rod Can, MD;  Location: [facility];  Service: Orthopedics;  Laterality: Left;  . PATELLAR TENDON REPAIR  10/03/2012   Procedure: PATELLA TENDON REPAIR;  Surgeon: Marin Shutter, MD;  Location: [place];  Service: Orthopedics;  Laterality: Right;  . PATELLECTOMY  10/03/2012   Procedure: PATELLECTOMY;  Surgeon: Marin Shutter, MD;  Location: [place];  Service: Orthopedics;  Laterality: Right;  RIGHT PARTIAL PATELLECTOMY AND PATELLA TENDON REPAIR  . REVISION OF ARTERIOVENOUS GORETEX GRAFT  09/27/2012   Procedure: REVISION OF ARTERIOVENOUS GORETEX GRAFT;  Surgeon: Mal Misty, MD;  Location: [place];  Service: Vascular;  Laterality:  Right;  1) Replacement of venous half of loop with 59mm Gortex graft  2) Excision of erroded pseudoaneurysm of graft with primary closure.  Marland Kitchen REVISION OF ARTERIOVENOUS GORETEX GRAFT Right 01/23/2013   Procedure: REVISION OF ARTERIOVENOUS GORETEX GRAFT;  Surgeon: Conrad Coosa, MD;  Location: [facility] OR;  Service: Vascular;  Laterality: Right;  Using piece of 56mm x 20cm Gortex graft.   Marland Kitchen REVISION OF ARTERIOVENOUS GORETEX GRAFT Right 10/07/2015   Procedure: REVISION OF Right arm ARTERIOVENOUS GORETEX GRAFT;  Surgeon: Elam Dutch, MD;  Location: [facility] OR;  Service: Vascular;  Laterality: Right;  . REVISION OF ARTERIOVENOUS GORETEX GRAFT Right 02/17/2016   Procedure: REVISION OF ARTERIOVENOUS GORETEX GRAFT;  Surgeon: Elam Dutch, MD;  Location: [place];  Service: Vascular;  Laterality: Right;  . TOTAL ABDOMINAL HYSTERECTOMY  03-05-2005   w/  Right Ovarian Cystectomy  . TOTAL PARATHYROIDECTOMY/  THYROID ISTHMUSECTOMY/  AUTOTRANSPLANTATION PARATHYROID TISSUE TO LEFT BRACHIORIADIALIS MUSCLE  02-18-2011  . TRANSTHORACIC ECHOCARDIOGRAM  10-31-2012   mild LVH,  grade 1 diastolic dysfunction,  ef 55-65%/  mild MR/  trivial TR     OB History   No obstetric history on file.     Family History  Problem Relation Age of Onset  . Healthy Mother   . Hypertension Father   . Kidney failure Father   . Hypertension Sister   . Thyroid disease Sister     Social History   Tobacco Use  . Smoking status: Never Smoker  . Smokeless tobacco: Never Used  Vaping Use  . Vaping Use: Never used  Substance Use Topics  . Alcohol use: No    Alcohol/week: 0.0 standard drinks    Comment: quit 2007  . Drug use: No    Home Medications Prior to Admission medications   Medication Sig Start Date End Date Taking? Authorizing Provider  calcium acetate (PHOSLO) 667 MG capsule Take 2,001 mg by mouth 3 (three) times daily. 05/28/20  Yes [provider]  cetirizine (ZYRTEC) 5 MG tablet Take 5 mg by mouth daily as needed  for allergies.  04/24/20  Yes [provider]  fluticasone (FLONASE) 50 MCG/ACT nasal spray Place 2 sprays into both nostrils daily.   Yes [provider]  hydrALAZINE (APRESOLINE) 25 MG tablet Take 1 tablet (25 mg total) by mouth in the morning and at bedtime. 05/21/20  Yes Drenda Freeze, MD  omeprazole (PRILOSEC) 20 MG capsule Take 20 mg by mouth daily as needed ([place]).  04/24/20  Yes [provider]  HYDROcodone-acetaminophen (NORCO/VICODIN) 5-325 MG tablet Take 1 tablet by mouth every 6 (six) hours as needed for moderate pain. Patient not taking: Reported on 05/30/2020 03/28/20   Milton Ferguson, MD  HYDROcodone-acetaminophen (NORCO/VICODIN) 5-325 MG tablet Take 1 tablet by mouth every 6 (six) hours as needed for up to 12 days. 05/30/20  06/11/20  Breck Coons, MD  Lactulose 20 GM/30ML SOLN Take 30 mLs (20 g total) by mouth in the morning and at bedtime. Patient not taking: Reported on 05/30/2020 05/26/20   Loura Halt A, NP  methocarbamol (ROBAXIN) 500 MG tablet Take 500 mg by mouth 2 (two) times daily. Patient not taking: Reported on 05/30/2020 03/11/20   [provider]  ondansetron (ZOFRAN ODT) 4 MG disintegrating tablet Take 1 tablet (4 mg total) by mouth every 8 (eight) hours as needed for up to 10 doses for nausea or vomiting. 05/30/20   Breck Coons, MD  ondansetron (ZOFRAN) 4 MG tablet Take 1 tablet (4 mg total) by mouth every 8 (eight) hours as needed for nausea or vomiting. Patient not taking: Reported on 02/26/2020 03/01/19   Domenic Moras, PA-C    Allergies    Amlodipine, Cefazolin, Cephalosporins, and Lisinopril  Review of Systems   Review of Systems  Constitutional: Negative for fever.    Physical Exam Updated Vital Signs BP (!) 159/96 (BP Location: Left Arm)   Pulse 68   Temp 97.8 F (36.6 C) (Oral)   Resp 20   Ht 5\' 4"  (1.626 m)   Wt 80.3 kg   SpO2 100%   BMI 30.38 kg/m   Physical Exam Vitals and nursing note reviewed. Exam conducted with  a chaperone present.  Constitutional:      General: She is not in acute distress.    Appearance: Normal appearance.  HENT:     Head: Normocephalic and atraumatic.     Nose: No rhinorrhea.  Eyes:     General:        Right eye: No discharge.        Left eye: No discharge.     Conjunctiva/sclera: Conjunctivae normal.  Cardiovascular:     Rate and Rhythm: Normal rate and regular rhythm.     Pulses:          Dorsalis pedis pulses are 2+ on the right side and 2+ on the left side.  Pulmonary:     Effort: Pulmonary effort is normal. No respiratory distress.     Breath sounds: No stridor.  Abdominal:     General: Abdomen is flat. There is no distension.     Palpations: Abdomen is soft.     Tenderness: There is no abdominal tenderness. There is no guarding or rebound.  Musculoskeletal:        General: Swelling and tenderness present. No signs of injury.     Right lower leg: Edema present.     Left lower leg: Edema present.     Comments: Trace mild edema to bilateral legs, swelling of the right knee relative to the left no erythema no decreased range of motion able to bear weight large surgical scar well-healed no signs of overlying infection  Skin:    General: Skin is warm and dry.  Neurological:     General: No focal deficit present.     Mental Status: She is alert. Mental status is at baseline.     Motor: No weakness.  Psychiatric:        Mood and Affect: Mood normal.        Behavior: Behavior normal.     ED Results / Procedures / Treatments   Labs (all labs ordered are listed, but only abnormal results are displayed) Labs Reviewed  CBC WITH DIFFERENTIAL/PLATELET - Abnormal; Notable for the following components:      Result Value   Hemoglobin 11.8 (*)  MCV 78.4 (*)    MCH 24.1 (*)    RDW 18.9 (*)    All other components within normal limits  COMPREHENSIVE METABOLIC PANEL - Abnormal; Notable for the following components:   Potassium 3.3 (*)    Chloride 97 (*)     Creatinine, Ser 6.92 (*)    Albumin 3.1 (*)    GFR calc non Af Amer 6 (*)    GFR calc Af Amer 7 (*)    All other components within normal limits  SARS CORONAVIRUS 2 BY RT PCR [facility] ORDER[facility])  LIPASE, BLOOD    EKG EKG Interpretation  Date/Time:  Friday May 30 2020 15:10:41 EDT Ventricular Rate:  92 PR Interval:  216 QRS Duration: 96 QT Interval:  408 QTC Calculation: 504 R Axis:   -53 Text Interpretation: Sinus rhythm with 1st degree A-V block with occasional Premature ventricular complexes Left anterior fascicular block Possible Lateral infarct , age undetermined Abnormal ECG similar compared to prior Confirmed by Dewaine Conger [phone]) on 05/30/2020 3:17:02 PM   Radiology DG Knee Complete 4 Views Right  Result Date: 05/30/2020 CLINICAL DATA:  Acute on chronic knee pain. EXAM: RIGHT KNEE - COMPLETE 4+ VIEW COMPARISON:  11/12/2012 FINDINGS: There is no acute displaced fracture or dislocation. Mild tricompartmental degenerative changes are noted. There is no significant joint effusion. 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 IMPRESSION: Negative. Electronically Signed   By: Constance Holster M.D.   On: 05/30/2020 14:55   VAS [place] LOWER EXTREMITY VENOUS (DVT) (ONLY [facility] & [facility] 7a-7p)  Result Date: 05/30/2020  Lower Venous DVTStudy Indications: Pain, and Swelling.  Risk Factors: Dialysis. Performing Technologist: Griffin Basil RCT RDMS  Examination Guidelines: A complete evaluation includes B-mode imaging, spectral Doppler, color Doppler, and power Doppler as needed of all accessible portions of each vessel. Bilateral testing is considered an integral part of a complete examination. Limited examinations for reoccurring indications may be performed as noted. The reflux portion of the exam is performed with the patient in reverse Trendelenburg.  +---------+---------------+---------+-----------+----------+--------------+ RIGHT     CompressibilityPhasicitySpontaneityPropertiesThrombus Aging +---------+---------------+---------+-----------+----------+--------------+ CFV      Full           Yes      Yes                                 +---------+---------------+---------+-----------+----------+--------------+ SFJ      Full                                                        +---------+---------------+---------+-----------+----------+--------------+ FV Prox  Full                                                        +---------+---------------+---------+-----------+----------+--------------+ FV Mid   Full                                                        +---------+---------------+---------+-----------+----------+--------------+  FV DistalFull                                                        +---------+---------------+---------+-----------+----------+--------------+ PFV      Full                                                        +---------+---------------+---------+-----------+----------+--------------+ POP      Full           Yes      Yes                                 +---------+---------------+---------+-----------+----------+--------------+ PTV      Full                                                        +---------+---------------+---------+-----------+----------+--------------+ PERO     Full                                                        +---------+---------------+---------+-----------+----------+--------------+     Summary: RIGHT: - There is no evidence of deep vein thrombosis in the lower extremity.  - No cystic structure found in the popliteal fossa.  LEFT: - No evidence of common femoral vein obstruction.  *See table(s) above for measurements and observations.    Preliminary     Procedures Procedures (including critical care time)  Medications Ordered in ED Medications  HYDROcodone-acetaminophen (HYCET) 7.5-325 mg/15 ml solution 10  mL (10 mLs Oral Given 05/30/20 1424)    ED Course  I have reviewed the triage vital signs and the nursing notes.  Pertinent labs & imaging results that were available during my care of the patient were reviewed by me and considered in my medical decision making (see chart for details).    MDM Rules/Calculators/A&P                          Patient is here with multiple complaints, still dealing with intermittent constipation, having persistent nausea vomiting.  Only got half her dialysis session today.  Came with leg pain is getting worse.  States she is got multiple steroid injections in the past and she thinks that is what helps.  She denies any new trauma however has had a replacement that knee.  We will get an x-ray to check for the hardware to make sure it is in place there is been no microtrauma or missed work trauma not reported.  DVT study was done as part of her triage work-up and is unremarkable for identifying acute venous occlusion.  She has no overlying signs of infection.  No fevers.  As per the vomiting her abdomen is soft nontender, she has no focal pain just  is vomiting a lot.  She does not believe she has been exposed anybody and she has not been sick otherwise.  We will get screening laboratory studies.  We will get a plain film mention will try symptomatic control of her nausea vomiting if that is obtained we may need to assess with imaging.  The patient denies any shortness of breath or chest pain however with mild lower extremity swelling and nausea vomiting we will get an EKG just to make sure there is no strain on the heart.  Had a chance to review her record and previous EKGs and appears to be no significant chronic changes so we will be able to compare her one today to that.  Still waiting on imaging and labs to return.  EKG reviewed by me shows similar sinus rhythm, no acute ischemic change possible slightly increased PR interval otherwise no signs of interval abnormality or  arrhythmia.  And her symptoms are resolved with pain medication and antiemetics.  I do not feel there is a cardiac cause of this.  There is no bony change on her x-ray that is acute for fracture or malalignment.  She is able to ambulate.  There is no signs of vascular abnormality either venous or arterial.  Labs are at baseline with no acute changes.  She safe for outpatient management for acute on chronic worsening knee pain.  Final Clinical Impression(s) / ED Diagnoses Final diagnoses:  Right knee pain, unspecified chronicity  Vomiting in adult    Rx / DC Orders ED Discharge Orders         Ordered    HYDROcodone-acetaminophen (NORCO/VICODIN) 5-325 MG tablet  Every 6 hours PRN        05/30/20 1530    ondansetron (ZOFRAN ODT) 4 MG disintegrating tablet  Every 8 hours PRN        05/30/20 1531             Breck Coons, MD 05/30/20 1536

## 2020-05-30 NOTE — ED Notes (Signed)
Verbalized understanding of DC instructions, Rx, follow up care with PCP

## 2020-06-02 DIAGNOSIS — Z23 Encounter for immunization: Secondary | ICD-10-CM | POA: Diagnosis not present

## 2020-06-02 DIAGNOSIS — D509 Iron deficiency anemia, unspecified: Secondary | ICD-10-CM | POA: Diagnosis not present

## 2020-06-02 DIAGNOSIS — N186 End stage renal disease: Secondary | ICD-10-CM | POA: Diagnosis not present

## 2020-06-02 DIAGNOSIS — Z992 Dependence on renal dialysis: Secondary | ICD-10-CM | POA: Diagnosis not present

## 2020-06-02 DIAGNOSIS — D631 Anemia in chronic kidney disease: Secondary | ICD-10-CM | POA: Diagnosis not present

## 2020-06-04 DIAGNOSIS — D631 Anemia in chronic kidney disease: Secondary | ICD-10-CM | POA: Diagnosis not present

## 2020-06-04 DIAGNOSIS — D509 Iron deficiency anemia, unspecified: Secondary | ICD-10-CM | POA: Diagnosis not present

## 2020-06-04 DIAGNOSIS — N186 End stage renal disease: Secondary | ICD-10-CM | POA: Diagnosis not present

## 2020-06-04 DIAGNOSIS — Z992 Dependence on renal dialysis: Secondary | ICD-10-CM | POA: Diagnosis not present

## 2020-06-04 DIAGNOSIS — Z23 Encounter for immunization: Secondary | ICD-10-CM | POA: Diagnosis not present

## 2020-06-06 ENCOUNTER — Ambulatory Visit: Payer: Medicare Other | Admitting: Nurse Practitioner

## 2020-06-06 DIAGNOSIS — N186 End stage renal disease: Secondary | ICD-10-CM | POA: Diagnosis not present

## 2020-06-06 DIAGNOSIS — D631 Anemia in chronic kidney disease: Secondary | ICD-10-CM | POA: Diagnosis not present

## 2020-06-06 DIAGNOSIS — Z23 Encounter for immunization: Secondary | ICD-10-CM | POA: Diagnosis not present

## 2020-06-06 DIAGNOSIS — Z992 Dependence on renal dialysis: Secondary | ICD-10-CM | POA: Diagnosis not present

## 2020-06-06 DIAGNOSIS — D509 Iron deficiency anemia, unspecified: Secondary | ICD-10-CM | POA: Diagnosis not present

## 2020-06-09 ENCOUNTER — Encounter: Payer: Self-pay | Admitting: Nurse Practitioner

## 2020-06-09 ENCOUNTER — Other Ambulatory Visit: Payer: Self-pay

## 2020-06-09 ENCOUNTER — Ambulatory Visit (INDEPENDENT_AMBULATORY_CARE_PROVIDER_SITE_OTHER): Payer: Medicare Other | Admitting: Nurse Practitioner

## 2020-06-09 VITALS — BP 140/60 | HR 84 | Temp 97.0°F | Ht 61.75 in | Wt 174.6 lb

## 2020-06-09 DIAGNOSIS — H903 Sensorineural hearing loss, bilateral: Secondary | ICD-10-CM | POA: Diagnosis not present

## 2020-06-09 DIAGNOSIS — D509 Iron deficiency anemia, unspecified: Secondary | ICD-10-CM | POA: Diagnosis not present

## 2020-06-09 DIAGNOSIS — Z780 Asymptomatic menopausal state: Secondary | ICD-10-CM

## 2020-06-09 DIAGNOSIS — N2581 Secondary hyperparathyroidism of renal origin: Secondary | ICD-10-CM | POA: Diagnosis not present

## 2020-06-09 DIAGNOSIS — Z1211 Encounter for screening for malignant neoplasm of colon: Secondary | ICD-10-CM

## 2020-06-09 DIAGNOSIS — D631 Anemia in chronic kidney disease: Secondary | ICD-10-CM | POA: Diagnosis not present

## 2020-06-09 DIAGNOSIS — Z23 Encounter for immunization: Secondary | ICD-10-CM | POA: Diagnosis not present

## 2020-06-09 DIAGNOSIS — I10 Essential (primary) hypertension: Secondary | ICD-10-CM

## 2020-06-09 DIAGNOSIS — Z992 Dependence on renal dialysis: Secondary | ICD-10-CM | POA: Diagnosis not present

## 2020-06-09 DIAGNOSIS — N186 End stage renal disease: Secondary | ICD-10-CM | POA: Diagnosis not present

## 2020-06-09 NOTE — Progress Notes (Signed)
Subjective:  Patient ID: Victoria [name], female    DOB: [birth year]  Age: 64 y.o. MRN: [id number]  CC: Establish Care (New patient/Elevated BPs since 02/2020/Pt states she was having severe headaches but they have went away in the past month.)  Ear Fullness  There is pain in both ears. This is a chronic problem. The current episode started more than 1 year ago. The problem has been gradually worsening. There has been no fever. Associated symptoms include hearing loss. Pertinent negatives include no coughing, ear discharge, headaches, rhinorrhea or vomiting. She has tried nothing for the symptoms. There is no history of a chronic ear infection, hearing loss or a tympanostomy tube.  associated with ringing in ears  HTN (hypertension) Chronic, waxing and waning.  She discontinued hydralazine because she does not think she needs it.  Denies any dizziness or headache today She does not check BP at home, but plans to get a machine. Secondary to end stage renal disease-dialysis: schedule MWF at [facility] in [place]. Under the care of [name]. BP reading during Friday session: 125/72, 147/87, 117/75, 125/78 BP reading during today's Monday session: 162/84, 159/91, 146/87, 137/82 Reviewed lab results from recent hospitalization (CMP and CBC) 05/30/2020  BP Readings from Last 3 Encounters:  06/09/20 140/60  05/30/20 (!) 161/86  05/26/20 (!) 160/96   Advised to Monitor BP at home, once a day in AM on non-dialysis days Call office if BP >150/90 for more than 24hrs. F/up in 76month   She had colonoscopy in past, over 15yrs ago. She denies any hx of colon polylps/diarrhea/IBD or FHx of colon cancer. She agreed to coloquard order.  Reviewed past Medical, Social and Family history today.  Outpatient Medications Prior to Visit  Medication Sig Dispense Refill   calcium acetate (PHOSLO) 667 MG capsule Take 2,001 mg by mouth 3 (three) times daily.      fluticasone (FLONASE) 50  MCG/ACT nasal spray Place 2 sprays into both nostrils daily.      hydrALAZINE (APRESOLINE) 25 MG tablet Take 1 tablet (25 mg total) by mouth in the morning and at bedtime. (Patient not taking: Reported on 06/09/2020) 30 tablet 0   Lactulose 20 GM/30ML SOLN Take 30 mLs (20 g total) by mouth in the morning and at bedtime. (Patient not taking: Reported on 05/30/2020) 236 mL 0   omeprazole (PRILOSEC) 20 MG capsule Take 20 mg by mouth daily as needed ([place]).  (Patient not taking: Reported on 06/09/2020)     cetirizine (ZYRTEC) 5 MG tablet Take 5 mg by mouth daily as needed for allergies.  (Patient not taking: Reported on 06/09/2020)     HYDROcodone-acetaminophen (NORCO/VICODIN) 5-325 MG tablet Take 1 tablet by mouth every 6 (six) hours as needed for moderate pain. (Patient not taking: Reported on 05/30/2020) 20 tablet 0   HYDROcodone-acetaminophen (NORCO/VICODIN) 5-325 MG tablet Take 1 tablet by mouth every 6 (six) hours as needed for up to 12 days. (Patient not taking: Reported on 06/09/2020) 10 tablet 0   methocarbamol (ROBAXIN) 500 MG tablet Take 500 mg by mouth 2 (two) times daily. (Patient not taking: Reported on 05/30/2020)     ondansetron (ZOFRAN ODT) 4 MG disintegrating tablet Take 1 tablet (4 mg total) by mouth every 8 (eight) hours as needed for up to 10 doses for nausea or vomiting. (Patient not taking: Reported on 06/09/2020) 10 tablet 0   ondansetron (ZOFRAN) 4 MG tablet Take 1 tablet (4 mg total) by mouth every 8 (eight) hours  as needed for nausea or vomiting. (Patient not taking: Reported on 02/26/2020) 12 tablet 0   No facility-administered medications prior to visit.    ROS See HPI  Objective:  BP 140/60 (BP Location: Left Arm, Patient Position: Sitting, Cuff Size: Normal)    Pulse 84    Temp (!) 97 F (36.1 C) (Temporal)    Ht 5' 1.75" (1.568 m)    Wt 174 lb 9.6 oz (79.2 kg)    SpO2 100%    BMI 32.19 kg/m   Physical Exam Constitutional:      Appearance: She is obese.  HENT:      Right Ear: Tympanic membrane, ear canal and external ear normal. There is no impacted cerumen.     Left Ear: Tympanic membrane, ear canal and external ear normal. There is no impacted cerumen.  Cardiovascular:     Rate and Rhythm: Normal rate and regular rhythm.     Pulses: Normal pulses.     Heart sounds: Murmur heard.   Pulmonary:     Effort: Pulmonary effort is normal.     Breath sounds: Normal breath sounds.  Musculoskeletal:     Right lower leg: No edema.     Left lower leg: No edema.  Neurological:     Mental Status: She is alert and oriented to person, place, and time.  Psychiatric:        Mood and Affect: Mood normal.        Behavior: Behavior normal.        Thought Content: Thought content normal.    Assessment & Plan:  This visit occurred during the SARS-CoV-2 public health emergency.  Safety protocols were in place, including screening questions prior to the visit, additional usage of staff PPE, and extensive cleaning of exam room while observing appropriate contact time as indicated for disinfecting solutions.   Victoria [name] was seen today for establish care.  Diagnoses and all orders for this visit:  Essential hypertension  Colon cancer screening -     Cologuard; Future  Sensorineural hearing loss (SNHL) of both ears -     Ambulatory referral to Audiology  Asymptomatic age-related postmenopausal state -     DG Bone Density; Future  Hyperparathyroidism, secondary renal ([place]) -     DG Bone Density; Future    Problem List Items Addressed This Visit      Cardiovascular and Mediastinum   HTN (hypertension) - Primary    Chronic, waxing and waning.  She discontinued hydralazine because she does not think she needs it.  Denies any dizziness or headache today She does not check BP at home, but plans to get a machine. Secondary to end stage renal disease-dialysis: schedule MWF at [facility] in [place]. Under the care of [name]. BP reading during  Friday session: 125/72, 147/87, 117/75, 125/78 BP reading during today's Monday session: 162/84, 159/91, 146/87, 137/82 Reviewed lab results from recent hospitalization (CMP and CBC) 05/30/2020  BP Readings from Last 3 Encounters:  06/09/20 140/60  05/30/20 (!) 161/86  05/26/20 (!) 160/96   Advised to Monitor BP at home, once a day in AM on non-dialysis days Call office if BP >150/90 for more than 24hrs. F/up in 78month          Endocrine   Secondary hyperparathyroidism of renal origin ([place])     Nervous and Auditory   Sensorineural hearing loss (SNHL) of both ears   Relevant Orders   Ambulatory referral to Audiology    Other  Visit Diagnoses    Colon cancer screening       Relevant Orders   Cologuard   Asymptomatic age-related postmenopausal state       Relevant Orders   DG Bone Density      Follow-up: Return in about 4 weeks (around 07/07/2020) for HTN, hyperlipidemia and thyroid nodule (F2F, 33mins).  Wilfred Lacy, NP

## 2020-06-09 NOTE — Assessment & Plan Note (Addendum)
Chronic, waxing and waning.  She discontinued hydralazine because she does not think she needs it.  Denies any dizziness or headache today She does not check BP at home, but plans to get a machine. Secondary to end stage renal disease-dialysis: schedule MWF at [facility] in [place]. Under the care of [name]. BP reading during Friday session: 125/72, 147/87, 117/75, 125/78 BP reading during today's Monday session: 162/84, 159/91, 146/87, 137/82 Reviewed lab results from recent hospitalization (CMP and CBC) 05/30/2020  BP Readings from Last 3 Encounters:  06/09/20 140/60  05/30/20 (!) 161/86  05/26/20 (!) 160/96   Advised to Monitor BP at home, once a day in AM on non-dialysis days Call office if BP >150/90 for more than 24hrs. F/up in 78month

## 2020-06-09 NOTE — Patient Instructions (Signed)
Continue dialysis sessions as scheduled  Monitor BP at home, once a day in AM on non-dialysis days Call office if BP >150/90 for more than 24hrs.  You will be contacted to schedule appt with audiology.  You will be contacted about cologuard kit.

## 2020-06-11 DIAGNOSIS — D631 Anemia in chronic kidney disease: Secondary | ICD-10-CM | POA: Diagnosis not present

## 2020-06-11 DIAGNOSIS — Z23 Encounter for immunization: Secondary | ICD-10-CM | POA: Diagnosis not present

## 2020-06-11 DIAGNOSIS — N186 End stage renal disease: Secondary | ICD-10-CM | POA: Diagnosis not present

## 2020-06-11 DIAGNOSIS — Z992 Dependence on renal dialysis: Secondary | ICD-10-CM | POA: Diagnosis not present

## 2020-06-11 DIAGNOSIS — D509 Iron deficiency anemia, unspecified: Secondary | ICD-10-CM | POA: Diagnosis not present

## 2020-06-12 ENCOUNTER — Encounter: Payer: Self-pay | Admitting: Nurse Practitioner

## 2020-06-12 LAB — COLOGUARD: Cologuard: NEGATIVE

## 2020-06-13 DIAGNOSIS — D631 Anemia in chronic kidney disease: Secondary | ICD-10-CM | POA: Diagnosis not present

## 2020-06-13 DIAGNOSIS — N186 End stage renal disease: Secondary | ICD-10-CM | POA: Diagnosis not present

## 2020-06-13 DIAGNOSIS — Z23 Encounter for immunization: Secondary | ICD-10-CM | POA: Diagnosis not present

## 2020-06-13 DIAGNOSIS — Z992 Dependence on renal dialysis: Secondary | ICD-10-CM | POA: Diagnosis not present

## 2020-06-13 DIAGNOSIS — D509 Iron deficiency anemia, unspecified: Secondary | ICD-10-CM | POA: Diagnosis not present

## 2020-06-15 LAB — EXTERNAL GENERIC LAB PROCEDURE

## 2020-06-15 LAB — COLOGUARD

## 2020-06-16 DIAGNOSIS — N186 End stage renal disease: Secondary | ICD-10-CM | POA: Diagnosis not present

## 2020-06-16 DIAGNOSIS — Z992 Dependence on renal dialysis: Secondary | ICD-10-CM | POA: Diagnosis not present

## 2020-06-16 DIAGNOSIS — Z23 Encounter for immunization: Secondary | ICD-10-CM | POA: Diagnosis not present

## 2020-06-16 DIAGNOSIS — D631 Anemia in chronic kidney disease: Secondary | ICD-10-CM | POA: Diagnosis not present

## 2020-06-16 DIAGNOSIS — D509 Iron deficiency anemia, unspecified: Secondary | ICD-10-CM | POA: Diagnosis not present

## 2020-06-18 DIAGNOSIS — Z23 Encounter for immunization: Secondary | ICD-10-CM | POA: Diagnosis not present

## 2020-06-18 DIAGNOSIS — D631 Anemia in chronic kidney disease: Secondary | ICD-10-CM | POA: Diagnosis not present

## 2020-06-18 DIAGNOSIS — Z992 Dependence on renal dialysis: Secondary | ICD-10-CM | POA: Diagnosis not present

## 2020-06-18 DIAGNOSIS — N186 End stage renal disease: Secondary | ICD-10-CM | POA: Diagnosis not present

## 2020-06-18 DIAGNOSIS — D509 Iron deficiency anemia, unspecified: Secondary | ICD-10-CM | POA: Diagnosis not present

## 2020-06-19 ENCOUNTER — Other Ambulatory Visit: Payer: Self-pay

## 2020-06-19 ENCOUNTER — Ambulatory Visit: Payer: Medicare Other | Attending: Nurse Practitioner | Admitting: Audiologist

## 2020-06-19 DIAGNOSIS — H9313 Tinnitus, bilateral: Secondary | ICD-10-CM | POA: Insufficient documentation

## 2020-06-19 DIAGNOSIS — H903 Sensorineural hearing loss, bilateral: Secondary | ICD-10-CM | POA: Diagnosis not present

## 2020-06-19 NOTE — Procedures (Signed)
  [facility] and [place],   [id number] [phone]  AUDIOLOGICAL  EVALUATION  NAME: Victoria [name]     DOB:   13-Jan-1956      MRN: [id number]                                                                                     DATE: 06/19/2020     REFERENT: Flossie Buffy, NP STATUS: Outpatient DIAGNOSIS: Sensory hearing loss, bilateral  Tinnitus of both ears    History: Victoria [name] was seen for an audiological evaluation. Victoria [name] is receiving a hearing evaluation due to concerns for ringing in both ears. Victoria [name] has constant high pitched ringing in both ears that has been getting louder. The ringing does not impact her sleep. She says the TV has to be on all day to drown out the sound of the /eee/. This difficulty began gradually. No pain or pressure reported in either ear. The ringing started randomly, there was no illness, injury, or trauma when the sound started. Medical history positive for kidney disease which is a risk factor for hearing loss. No other relevant case history reported.    Evaluation:   Otoscopy showed a clear view of the tympanic membranes, bilaterally  Tympanometry results were consistent with normal middle ear function, bilaterally    Audiometric testing was completed using conventional audiometry with insert transducer. Speech Recognition Thresholds were consistent with pure tone averages. Word Recognition was excellent at an elevated level. Pure tone thresholds show normal sloping to a moderate sensorineural hearing loss in both ears. Test results are consistent with moderate high frequency hearing loss.   Tinnitus was pitch and loudness matched bilaterally to a 6k Hz tone at 7dB SL.   Results:  The test results were reviewed with Victoria [name]. She was informed of the nature and degree of her hearing loss and how it correlates with the pitch of her tinnitus. After explaining her hearing loss she reported difficulty  understanding people unless she looks at their face. Victoria [name] has likely been lipreading for a while. Due to her good word recognition hearing aids are recommended for both ears to help with the tinnitus. The tinnitus pitch can be reached by hearing aids and is soft enough to be masked. Victoria [name] was given a list of local providers who dispense hearing aids and a copy of her results.   Recommendations: 1. Amplification is necessary for both ears to address the hearing loss and decrease tinnitus awareness. Hearing aids can be purchased from a variety of locations. See provided list for locations in the [place] area.    Alfonse Alpers  Audiologist, Au.D., CCC-A 06/19/2020  11:44 AM  Cc: Flossie Buffy, NP

## 2020-06-20 DIAGNOSIS — N186 End stage renal disease: Secondary | ICD-10-CM | POA: Diagnosis not present

## 2020-06-20 DIAGNOSIS — T8484XA Pain due to internal orthopedic prosthetic devices, implants and grafts, initial encounter: Secondary | ICD-10-CM | POA: Diagnosis not present

## 2020-06-20 DIAGNOSIS — N2581 Secondary hyperparathyroidism of renal origin: Secondary | ICD-10-CM | POA: Diagnosis not present

## 2020-06-20 DIAGNOSIS — I120 Hypertensive chronic kidney disease with stage 5 chronic kidney disease or end stage renal disease: Secondary | ICD-10-CM | POA: Diagnosis not present

## 2020-06-20 DIAGNOSIS — M25571 Pain in right ankle and joints of right foot: Secondary | ICD-10-CM | POA: Diagnosis not present

## 2020-06-20 DIAGNOSIS — Z992 Dependence on renal dialysis: Secondary | ICD-10-CM | POA: Diagnosis not present

## 2020-06-20 DIAGNOSIS — D509 Iron deficiency anemia, unspecified: Secondary | ICD-10-CM | POA: Diagnosis not present

## 2020-06-23 DIAGNOSIS — N186 End stage renal disease: Secondary | ICD-10-CM | POA: Diagnosis not present

## 2020-06-23 DIAGNOSIS — D509 Iron deficiency anemia, unspecified: Secondary | ICD-10-CM | POA: Diagnosis not present

## 2020-06-23 DIAGNOSIS — Z992 Dependence on renal dialysis: Secondary | ICD-10-CM | POA: Diagnosis not present

## 2020-06-23 DIAGNOSIS — N2581 Secondary hyperparathyroidism of renal origin: Secondary | ICD-10-CM | POA: Diagnosis not present

## 2020-06-24 ENCOUNTER — Ambulatory Visit (INDEPENDENT_AMBULATORY_CARE_PROVIDER_SITE_OTHER): Payer: Medicare Other

## 2020-06-24 VITALS — Ht 61.0 in | Wt 167.0 lb

## 2020-06-24 DIAGNOSIS — Z0000 Encounter for general adult medical examination without abnormal findings: Secondary | ICD-10-CM

## 2020-06-24 NOTE — Patient Instructions (Signed)
Ms. Victoria [name] , Thank you for taking time to complete your Medicare Wellness Visit. I appreciate your ongoing commitment to your health goals. Please review the following plan we discussed and let me know if I can assist you in the future.   Screening recommendations/referrals: Colonoscopy: Cologuard collected 06/21/20-No results available yet. Mammogram: Completed 05/20/2020- Due 05/20/2021 Bone Density: Scheduled for 10/02/2020 Recommended yearly ophthalmology/optometry visit for glaucoma screening and checkup Recommended yearly dental visit for hygiene and checkup  Vaccinations: Influenza vaccine: Due Pneumococcal vaccine: Due for Pneumovax-23-Discuss with PCP at next office visit Tdap vaccine: Up to Date- Due-03/31/2026 Shingles vaccine: Completed vaccines   Covid-19:Completed vaccines  Advanced directives: Declined information  Conditions/risks identified: See problem list  Next appointment: Follow up in one year for your annual wellness visit    Preventive Care 48 Years and Older, Female Preventive care refers to lifestyle choices and visits with your health care provider that can promote health and wellness. What does preventive care include?  A yearly physical exam. This is also called an annual well check.  Dental exams once or twice a year.  Routine eye exams. Ask your health care provider how often you should have your eyes checked.  Personal lifestyle choices, including:  Daily care of your teeth and gums.  Regular physical activity.  Eating a healthy diet.  Avoiding tobacco and drug use.  Limiting alcohol use.  Practicing safe sex.  Taking low-dose aspirin every day.  Taking vitamin and mineral supplements as recommended by your health care provider. What happens during an annual well check? The services and screenings done by your health care provider during your annual well check will depend on your age, overall health, lifestyle risk factors, and family  history of disease. Counseling  Your health care provider may ask you questions about your:  Alcohol use.  Tobacco use.  Drug use.  Emotional well-being.  Home and relationship well-being.  Sexual activity.  Eating habits.  History of falls.  Memory and ability to understand (cognition).  Work and work Statistician.  Reproductive health. Screening  You may have the following tests or measurements:  Height, weight, and BMI.  Blood pressure.  Lipid and cholesterol levels. These may be checked every 5 years, or more frequently if you are over 32 years old.  Skin check.  Lung cancer screening. You may have this screening every year starting at age 60 if you have a 30-pack-year history of smoking and currently smoke or have quit within the past 15 years.  Fecal occult blood test (FOBT) of the stool. You may have this test every year starting at age 55.  Flexible sigmoidoscopy or colonoscopy. You may have a sigmoidoscopy every 5 years or a colonoscopy every 10 years starting at age 52.  Hepatitis C blood test.  Hepatitis B blood test.  Sexually transmitted disease (STD) testing.  Diabetes screening. This is done by checking your blood sugar (glucose) after you have not eaten for a while (fasting). You may have this done every 1-3 years.  Bone density scan. This is done to screen for osteoporosis. You may have this done starting at age 58.  Mammogram. This may be done every 1-2 years. Talk to your health care provider about how often you should have regular mammograms. Talk with your health care provider about your test results, treatment options, and if necessary, the need for more tests. Vaccines  Your health care provider may recommend certain vaccines, such as:  Influenza vaccine. This is recommended every  year.  Tetanus, diphtheria, and acellular pertussis (Tdap, Td) vaccine. You may need a Td booster every 10 years.  Zoster vaccine. You may need this after  age 48.  Pneumococcal 13-valent conjugate (PCV13) vaccine. One dose is recommended after age 19.  Pneumococcal polysaccharide (PPSV23) vaccine. One dose is recommended after age 6. Talk to your health care provider about which screenings and vaccines you need and how often you need them. This information is not intended to replace advice given to you by your health care provider. Make sure you discuss any questions you have with your health care provider. Document Released: 10/03/2015 Document Revised: 05/26/2016 Document Reviewed: 07/08/2015 Elsevier Interactive Patient Education  2017 [employer] Prevention in the Home Falls can cause injuries. They can happen to people of all ages. There are many things you can do to make your home safe and to help prevent falls. What can I do on the outside of my home?  Regularly fix the edges of walkways and driveways and fix any cracks.  Remove anything that might make you trip as you walk through a door, such as a raised step or threshold.  Trim any bushes or trees on the path to your home.  Use bright outdoor lighting.  Clear any walking paths of anything that might make someone trip, such as rocks or tools.  Regularly check to see if handrails are loose or broken. Make sure that both sides of any steps have handrails.  Any raised decks and porches should have guardrails on the edges.  Have any leaves, snow, or ice cleared regularly.  Use sand or salt on walking paths during winter.  Clean up any spills in your garage right away. This includes oil or grease spills. What can I do in the bathroom?  Use night lights.  Install grab bars by the toilet and in the tub and shower. Do not use towel bars as grab bars.  Use non-skid mats or decals in the tub or shower.  If you need to sit down in the shower, use a plastic, non-slip stool.  Keep the floor dry. Clean up any water that spills on the floor as soon as it  happens.  Remove soap buildup in the tub or shower regularly.  Attach bath mats securely with double-sided non-slip rug tape.  Do not have throw rugs and other things on the floor that can make you trip. What can I do in the bedroom?  Use night lights.  Make sure that you have a light by your bed that is easy to reach.  Do not use any sheets or blankets that are too big for your bed. They should not hang down onto the floor.  Have a firm chair that has side arms. You can use this for support while you get dressed.  Do not have throw rugs and other things on the floor that can make you trip. What can I do in the kitchen?  Clean up any spills right away.  Avoid walking on wet floors.  Keep items that you use a lot in easy-to-reach places.  If you need to reach something above you, use a strong step stool that has a grab bar.  Keep electrical cords out of the way.  Do not use floor polish or wax that makes floors slippery. If you must use wax, use non-skid floor wax.  Do not have throw rugs and other things on the floor that can make you trip. What can  I do with my stairs?  Do not leave any items on the stairs.  Make sure that there are handrails on both sides of the stairs and use them. Fix handrails that are broken or loose. Make sure that handrails are as long as the stairways.  Check any carpeting to make sure that it is firmly attached to the stairs. Fix any carpet that is loose or worn.  Avoid having throw rugs at the top or bottom of the stairs. If you do have throw rugs, attach them to the floor with carpet tape.  Make sure that you have a light switch at the top of the stairs and the bottom of the stairs. If you do not have them, ask someone to add them for you. What else can I do to help prevent falls?  Wear shoes that:  Do not have high heels.  Have rubber bottoms.  Are comfortable and fit you well.  Are closed at the toe. Do not wear sandals.  If you  use a stepladder:  Make sure that it is fully opened. Do not climb a closed stepladder.  Make sure that both sides of the stepladder are locked into place.  Ask someone to hold it for you, if possible.  Clearly mark and make sure that you can see:  Any grab bars or handrails.  First and last steps.  Where the edge of each step is.  Use tools that help you move around (mobility aids) if they are needed. These include:  Canes.  Walkers.  Scooters.  Crutches.  Turn on the lights when you go into a dark area. Replace any light bulbs as soon as they burn out.  Set up your furniture so you have a clear path. Avoid moving your furniture around.  If any of your floors are uneven, fix them.  If there are any pets around you, be aware of where they are.  Review your medicines with your doctor. Some medicines can make you feel dizzy. This can increase your chance of falling. Ask your doctor what other things that you can do to help prevent falls. This information is not intended to replace advice given to you by your health care provider. Make sure you discuss any questions you have with your health care provider. Document Released: 07/03/2009 Document Revised: 02/12/2016 Document Reviewed: 10/11/2014 Elsevier Interactive Patient Education  2017 [employer].

## 2020-06-24 NOTE — Progress Notes (Signed)
Subjective:   Victoria [name] is a 64 y.o. female who presents for an Initial Medicare Annual Wellness Visit.  I connected with [name] today by telephone and verified that I am speaking with the correct person using two identifiers. Location patient: home Location provider: work Persons participating in the virtual visit: patient, Marine scientist.    I discussed the limitations, risks, security and privacy concerns of performing an evaluation and management service by telephone and the availability of in person appointments. I also discussed with the patient that there may be a patient responsible charge related to this service. The patient expressed understanding and verbally consented to this telephonic visit.    Interactive audio and video telecommunications were attempted between this provider and patient, however failed, due to patient having technical difficulties OR patient did not have access to video capability.  We continued and completed visit with audio only.  Some vital signs may be absent or patient reported.   Time Spent with patient on telephone encounter: 25 minutes  Review of Systems     Cardiac Risk Factors include: advanced age (>79men, >40 women);obesity (BMI >30kg/m2);hypertension     Objective:    Today's Vitals   06/24/20 1328 06/24/20 1329  Weight: 167 lb (75.8 kg)   Height: 5\' 1"  (1.549 m)   PainSc:  9    Body mass index is 31.55 kg/m.  Advanced Directives 06/24/2020 05/01/2020 03/28/2020 03/19/2020 02/26/2020 02/23/2020 03/01/2019  Does Patient Have a Medical Advance Directive? No No No No No No No  Type of Advance Directive - - - - - - -  Would patient like information on creating a medical advance directive? No - Patient declined No - Patient declined No - Patient declined No - Patient declined No - Patient declined No - Patient declined No - Patient declined  Pre-existing out of facility DNR order (yellow form or pink MOST form) - - - - - - -    Current Medications  (verified) Outpatient Encounter Medications as of 06/24/2020  Medication Sig  . calcium acetate (PHOSLO) 667 MG capsule Take 2,001 mg by mouth 3 (three) times daily.   . fluticasone (FLONASE) 50 MCG/ACT nasal spray Place 2 sprays into both nostrils daily.   . hydrALAZINE (APRESOLINE) 25 MG tablet Take 1 tablet (25 mg total) by mouth in the morning and at bedtime. (Patient not taking: Reported on 06/09/2020)  . Lactulose 20 GM/30ML SOLN Take 30 mLs (20 g total) by mouth in the morning and at bedtime. (Patient not taking: Reported on 05/30/2020)  . omeprazole (PRILOSEC) 20 MG capsule Take 20 mg by mouth daily as needed ([place]).  (Patient not taking: Reported on 06/09/2020)   No facility-administered encounter medications on file as of 06/24/2020.    Allergies (verified) Amlodipine, Cefazolin, Cephalosporins, and Lisinopril   History: Past Medical History:  Diagnosis Date  . Chronic bronchitis ([place])   . Complication of anesthesia ~ 2011   "they gave me a medicine that swolled me and mouth burning up" (08/23/2012)  . Complication of anesthesia    slow to wake up  . ESRD (end stage renal disease) on dialysis [facility]) Nephrologist-- dr deterding   ESRD due to HTN-; "M/W/F; [place]" (11/28/2015  . GERD (gastroesophageal reflux disease)   . History of acute pulmonary edema    2003  . Hypertension    no longer on medications  . Left patella fracture   . Pneumonia    as a child  . Seasonal allergies   .  Secondary hyperparathyroidism, renal ([place])    s/p  total parathyroidectomy  2014  . Thyroid disease   . Wears glasses   . Wound dehiscence, surgical 11/28/2015   Past Surgical History:  Procedure Laterality Date  . ANKLE FRACTURE SURGERY Right ~ 2005   "has pins in it"  . APPLICATION OF WOUND VAC Left 11/28/2015   knee  . APPLICATION OF WOUND VAC Left 11/28/2015   Procedure: APPLICATION OF WOUND VAC;  Surgeon: Rod Can, MD;  Location: [place];  Service: Orthopedics;  Laterality:  Left;  . ARTERIOVENOUS GRAFT PLACEMENT  03-25-2005   Right forearm  w/  multiple Revision's   . CARDIOVASCULAR STRESS TEST  08-24-2012   abnormal nuclear study/  inferolateral and anteroseptal areas of scar,  no ischemia/  normal LV function and wall motion, ef 77%  . DIALYSIS FISTULA CREATION  1992   left upper arm ---  w/  Multiple Revision's until 2006  . ECTOPIC PREGNANCY SURGERY  1970's  . FRACTURE SURGERY    . I & D EXTREMITY Left 11/28/2015   Procedure: IRRIGATION AND DEBRIDEMENT LEFT KNEE WOUND ;  Surgeon: Rod Can, MD;  Location: [place];  Service: Orthopedics;  Laterality: Left;  . INCISION AND DRAINAGE OF WOUND Left 11/28/2015   knee  . ORIF PATELLA Left 10/31/2015   Procedure: OPEN REDUCTION INTERNAL (ORIF) FIXATION LEFT PATELLA;  Surgeon: Rod Can, MD;  Location: [place];  Service: Orthopedics;  Laterality: Left;  . PATELLAR TENDON REPAIR  10/03/2012   Procedure: PATELLA TENDON REPAIR;  Surgeon: Marin Shutter, MD;  Location: [place];  Service: Orthopedics;  Laterality: Right;  . PATELLECTOMY  10/03/2012   Procedure: PATELLECTOMY;  Surgeon: Marin Shutter, MD;  Location: [place];  Service: Orthopedics;  Laterality: Right;  RIGHT PARTIAL PATELLECTOMY AND PATELLA TENDON REPAIR  . REVISION OF ARTERIOVENOUS GORETEX GRAFT  09/27/2012   Procedure: REVISION OF ARTERIOVENOUS GORETEX GRAFT;  Surgeon: Mal Misty, MD;  Location: [place];  Service: Vascular;  Laterality: Right;  1) Replacement of venous half of loop with 52mm Gortex graft  2) Excision of erroded pseudoaneurysm of graft with primary closure.  Marland Kitchen REVISION OF ARTERIOVENOUS GORETEX GRAFT Right 01/23/2013   Procedure: REVISION OF ARTERIOVENOUS GORETEX GRAFT;  Surgeon: Conrad Fergus, MD;  Location: [facility] OR;  Service: Vascular;  Laterality: Right;  Using piece of 80mm x 20cm Gortex graft.   Marland Kitchen REVISION OF ARTERIOVENOUS GORETEX GRAFT Right 10/07/2015   Procedure: REVISION OF Right arm ARTERIOVENOUS GORETEX GRAFT;  Surgeon:  Elam Dutch, MD;  Location: [facility] OR;  Service: Vascular;  Laterality: Right;  . REVISION OF ARTERIOVENOUS GORETEX GRAFT Right 02/17/2016   Procedure: REVISION OF ARTERIOVENOUS GORETEX GRAFT;  Surgeon: Elam Dutch, MD;  Location: [place];  Service: Vascular;  Laterality: Right;  . TOTAL ABDOMINAL HYSTERECTOMY  03-05-2005   w/  Right Ovarian Cystectomy  . TOTAL PARATHYROIDECTOMY/  THYROID ISTHMUSECTOMY/  AUTOTRANSPLANTATION PARATHYROID TISSUE TO LEFT BRACHIORIADIALIS MUSCLE  02-18-2011  . TRANSTHORACIC ECHOCARDIOGRAM  10-31-2012   mild LVH,  grade 1 diastolic dysfunction,  ef 55-65%/  mild MR/  trivial TR   Family History  Problem Relation Age of Onset  . Healthy Mother   . Hypertension Father   . Kidney failure Father   . Hypertension Sister   . Thyroid disease Sister    Social History   Socioeconomic History  . Marital status: Legally Separated    Spouse name: Not on file  . Number  of children: Not on file  . Years of education: Not on file  . Highest education level: Not on file  Occupational History  . Not on file  Tobacco Use  . Smoking status: Never Smoker  . Smokeless tobacco: Never Used  Vaping Use  . Vaping Use: Never used  Substance and Sexual Activity  . Alcohol use: No    Alcohol/week: 0.0 standard drinks    Comment: quit 2007  . Drug use: No  . Sexual activity: Yes  Other Topics Concern  . Not on file  Social History Narrative  . Not on file   Social Determinants of Health   Financial Resource Strain: Low Risk   . Difficulty of Paying Living Expenses: Not hard at all  Food Insecurity: No Food Insecurity  . Worried About Charity fundraiser in the Last Year: Never true  . Ran Out of Food in the Last Year: Never true  Transportation Needs: No Transportation Needs  . Lack of Transportation (Medical): No  . Lack of Transportation (Non-Medical): No  Physical Activity: Insufficiently Active  . Days of Exercise per Week: 6 days  . Minutes of Exercise  per Session: 20 min  Stress: No Stress Concern Present  . Feeling of Stress : Not at all  Social Connections: Socially Isolated  . Frequency of Communication with Friends and Family: More than three times a week  . Frequency of Social Gatherings with Friends and Family: More than three times a week  . Attends Religious Services: Never  . Active Member of Clubs or Organizations: No  . Attends Archivist Meetings: Never  . Marital Status: Separated    Tobacco Counseling Counseling given: Not Answered   Clinical Intake:  Pre-visit preparation completed: Yes  Pain : 0-10 Pain Score: 9  Pain Type: Chronic pain Pain Location: Ankle Pain Orientation: Right Pain Onset: More than a month ago Pain Frequency: Intermittent     Nutritional Status: BMI > 30  Obese Nutritional Risks: None Diabetes: No  How often do you need to have someone help you when you read instructions, pamphlets, or other written materials from your doctor or pharmacy?: 1 - Never What is the last grade level you completed in school?: 11th grade  Diabetic?No  Interpreter Needed?: No  Information entered by :: Caroleen Hamman LPN   Activities of Daily Living In your present state of health, do you have any difficulty performing the following activities: 06/24/2020  Hearing? Y  Comment mild  Vision? N  Difficulty concentrating or making decisions? N  Walking or climbing stairs? N  Dressing or bathing? N  Doing errands, shopping? N  Preparing Food and eating ? N  Using the Toilet? N  In the past six months, have you accidently leaked urine? N  Do you have problems with loss of bowel control? N  Managing your Medications? N  Managing your Finances? N  Housekeeping or managing your Housekeeping? N  Some recent data might be hidden    Patient Care Team: Nche, Charlene Brooke, NP as PCP - General (Internal Medicine)  Indicate any recent Medical Services you may have received from other than  [facility] providers in the past year (date may be approximate).     Assessment:   This is a routine wellness examination for Victoria [name].  Hearing/Vision screen  Hearing Screening   125Hz  250Hz  500Hz  1000Hz  2000Hz  3000Hz  4000Hz  6000Hz  8000Hz   Right ear:           Left ear:  Comments: Mild hearing loss  Vision Screening Comments: Last eye exam-05/2020  Dietary issues and exercise activities discussed: Current Exercise Habits: Home exercise routine, Type of exercise: walking, Time (Minutes): 20, Frequency (Times/Week): 6, Weekly Exercise (Minutes/Week): 120, Intensity: Mild  Goals    . Patient Stated     Would like to lose weight by eating healthier      Depression Screen PHQ 2/9 Scores 06/24/2020  PHQ - 2 Score 0    Fall Risk Fall Risk  06/24/2020  Falls in the past year? 0  Number falls in past yr: 0  Injury with Fall? 0  Follow up Falls prevention discussed    Any stairs in or around the home? No  Home free of loose throw rugs in walkways, pet beds, electrical cords, etc? Yes  Adequate lighting in your home to reduce risk of falls? Yes   ASSISTIVE DEVICES UTILIZED TO PREVENT FALLS:  Life alert? No  Use of a cane, walker or w/c? No  Grab bars in the bathroom? Yes  Shower chair or bench in shower? No  Elevated toilet seat or a handicapped toilet? No   TIMED UP AND GO:  Was the test performed? No . Phone visit   Cognitive Function:No cognitive impairment noted.        Immunizations Immunization History  Administered Date(s) Administered  . Influenza,inj,Quad PF,6+ Mos 06/29/2019, 06/29/2019  . Influenza,inj,quad, With Preservative 06/20/2017  . Influenza-Unspecified 07/22/2015  . Moderna SARS-COVID-2 Vaccination 11/16/2019, 12/14/2019, 06/20/2020  . Pneumococcal Conjugate-13 01/15/2015  . Td 09/29/2012  . Tdap 03/31/2016  . Zoster Recombinat (Shingrix) 07/27/2019    TDAP status: Up to date   Flu vaccine status: Due- Patient plans to get the vaccine  next week.  Pneumococcal vaccine status:Due for pnuemovax-23- Discuss with PCP at next office visit.  Covid-19 vaccine status: Completed vaccines  Qualifies for Shingles Vaccine? No   Zostavax completed No   Shingrix Completed?: Yes  Screening Tests Health Maintenance  Topic Date Due  . Hepatitis C Screening  Never done  . HIV Screening  Never done  . PAP SMEAR-Modifier  Never done  . COLONOSCOPY  01/12/2016  . INFLUENZA VACCINE  04/20/2020  . DEXA SCAN  Never done  . PNA vac Low Risk Adult (2 of 2 - PPSV23) 05/26/2020  . MAMMOGRAM  05/20/2022  . TETANUS/TDAP  03/31/2026  . COVID-19 Vaccine  Completed    Health Maintenance  Health Maintenance Due  Topic Date Due  . Hepatitis C Screening  Never done  . HIV Screening  Never done  . PAP SMEAR-Modifier  Never done  . COLONOSCOPY  01/12/2016  . INFLUENZA VACCINE  04/20/2020  . DEXA SCAN  Never done  . PNA vac Low Risk Adult (2 of 2 - PPSV23) 05/26/2020    Colorectal Cancer Screening: Patient collected specimen for Cologuard on 06/21/2020- No results available yet.  Mammogram status: Completed 05/20/2020. Repeat every year   Bone Density Status: Scheduled for 10/02/2020  Lung Cancer Screening: (Low Dose CT Chest recommended if Age 41-80 years, 30 pack-year currently smoking OR have quit w/in 15years.) does not qualify.    Additional Screening:  Hepatitis C Screening: does qualify; Discuss with PCP  Vision Screening: Recommended annual ophthalmology exams for early detection of glaucoma and other disorders of the eye. Is the patient up to date with their annual eye exam?  Yes  Who is the provider or what is the name of the office in which the patient attends annual eye exams?  Unsure of name   Dental Screening: Recommended annual dental exams for proper oral hygiene  Community Resource Referral / Chronic Care Management: CRR required this visit?  No   CCM required this visit?  No      Plan:     I have  personally reviewed and noted the following in the patient's chart:   . Medical and social history . Use of alcohol, tobacco or illicit drugs  . Current medications and supplements . Functional ability and status . Nutritional status . Physical activity . Advanced directives . List of other physicians . Hospitalizations, surgeries, and ER visits in previous 12 months . Vitals . Screenings to include cognitive, depression, and falls . Referrals and appointments  In addition, I have reviewed and discussed with patient certain preventive protocols, quality metrics, and best practice recommendations. A written personalized care plan for preventive services as well as general preventive health recommendations were provided to patient.    Due to this being a telephonic visit, the after visit summary with patients personalized plan was offered to patient via mail or my-chart.  Per request, patient was mailed a copy of [place], LPN   29/12/7652  Nurse Health Advisor  Nurse Notes: None

## 2020-06-25 DIAGNOSIS — N2581 Secondary hyperparathyroidism of renal origin: Secondary | ICD-10-CM | POA: Diagnosis not present

## 2020-06-25 DIAGNOSIS — D509 Iron deficiency anemia, unspecified: Secondary | ICD-10-CM | POA: Diagnosis not present

## 2020-06-25 DIAGNOSIS — Z992 Dependence on renal dialysis: Secondary | ICD-10-CM | POA: Diagnosis not present

## 2020-06-25 DIAGNOSIS — N186 End stage renal disease: Secondary | ICD-10-CM | POA: Diagnosis not present

## 2020-06-27 DIAGNOSIS — N2581 Secondary hyperparathyroidism of renal origin: Secondary | ICD-10-CM | POA: Diagnosis not present

## 2020-06-27 DIAGNOSIS — D509 Iron deficiency anemia, unspecified: Secondary | ICD-10-CM | POA: Diagnosis not present

## 2020-06-27 DIAGNOSIS — N186 End stage renal disease: Secondary | ICD-10-CM | POA: Diagnosis not present

## 2020-06-27 DIAGNOSIS — Z992 Dependence on renal dialysis: Secondary | ICD-10-CM | POA: Diagnosis not present

## 2020-06-27 LAB — COLOGUARD

## 2020-06-27 LAB — EXTERNAL GENERIC LAB PROCEDURE

## 2020-06-30 DIAGNOSIS — N186 End stage renal disease: Secondary | ICD-10-CM | POA: Diagnosis not present

## 2020-06-30 DIAGNOSIS — Z992 Dependence on renal dialysis: Secondary | ICD-10-CM | POA: Diagnosis not present

## 2020-06-30 DIAGNOSIS — D509 Iron deficiency anemia, unspecified: Secondary | ICD-10-CM | POA: Diagnosis not present

## 2020-06-30 DIAGNOSIS — N2581 Secondary hyperparathyroidism of renal origin: Secondary | ICD-10-CM | POA: Diagnosis not present

## 2020-07-02 DIAGNOSIS — N2581 Secondary hyperparathyroidism of renal origin: Secondary | ICD-10-CM | POA: Diagnosis not present

## 2020-07-02 DIAGNOSIS — N186 End stage renal disease: Secondary | ICD-10-CM | POA: Diagnosis not present

## 2020-07-02 DIAGNOSIS — Z992 Dependence on renal dialysis: Secondary | ICD-10-CM | POA: Diagnosis not present

## 2020-07-02 DIAGNOSIS — D509 Iron deficiency anemia, unspecified: Secondary | ICD-10-CM | POA: Diagnosis not present

## 2020-07-03 ENCOUNTER — Other Ambulatory Visit (HOSPITAL_COMMUNITY): Payer: Self-pay | Admitting: Orthopedic Surgery

## 2020-07-04 DIAGNOSIS — N186 End stage renal disease: Secondary | ICD-10-CM | POA: Diagnosis not present

## 2020-07-04 DIAGNOSIS — D509 Iron deficiency anemia, unspecified: Secondary | ICD-10-CM | POA: Diagnosis not present

## 2020-07-04 DIAGNOSIS — Z992 Dependence on renal dialysis: Secondary | ICD-10-CM | POA: Diagnosis not present

## 2020-07-04 DIAGNOSIS — N2581 Secondary hyperparathyroidism of renal origin: Secondary | ICD-10-CM | POA: Diagnosis not present

## 2020-07-07 DIAGNOSIS — M170 Bilateral primary osteoarthritis of knee: Secondary | ICD-10-CM | POA: Diagnosis not present

## 2020-07-07 DIAGNOSIS — M1711 Unilateral primary osteoarthritis, right knee: Secondary | ICD-10-CM | POA: Diagnosis not present

## 2020-07-08 ENCOUNTER — Ambulatory Visit: Payer: Medicare Other | Admitting: Neurology

## 2020-07-08 ENCOUNTER — Other Ambulatory Visit (HOSPITAL_COMMUNITY)
Admission: RE | Admit: 2020-07-08 | Discharge: 2020-07-08 | Disposition: A | Discharge status: Home / Self Care | Payer: Medicare Other | Source: Ambulatory Visit | Attending: Orthopedic Surgery | Admitting: Orthopedic Surgery

## 2020-07-08 DIAGNOSIS — Z20822 Contact with and (suspected) exposure to covid-19: Secondary | ICD-10-CM | POA: Diagnosis not present

## 2020-07-08 DIAGNOSIS — Z01812 Encounter for preprocedural laboratory examination: Secondary | ICD-10-CM | POA: Diagnosis not present

## 2020-07-08 LAB — SARS CORONAVIRUS 2 (TAT 6-24 HRS): SARS Coronavirus 2: NEGATIVE

## 2020-07-09 ENCOUNTER — Encounter (HOSPITAL_COMMUNITY): Payer: Self-pay | Admitting: Orthopedic Surgery

## 2020-07-09 ENCOUNTER — Other Ambulatory Visit: Payer: Self-pay

## 2020-07-09 DIAGNOSIS — N186 End stage renal disease: Secondary | ICD-10-CM | POA: Diagnosis not present

## 2020-07-09 DIAGNOSIS — Z992 Dependence on renal dialysis: Secondary | ICD-10-CM | POA: Diagnosis not present

## 2020-07-09 DIAGNOSIS — N2581 Secondary hyperparathyroidism of renal origin: Secondary | ICD-10-CM | POA: Diagnosis not present

## 2020-07-09 DIAGNOSIS — D509 Iron deficiency anemia, unspecified: Secondary | ICD-10-CM | POA: Diagnosis not present

## 2020-07-09 NOTE — Progress Notes (Signed)
Patient denies shortness of breath, fever, cough or chest pain.  PCP - Wilfred Lacy, NP Cardiologist - n/a  Chest x-ray - 03/19/20 (2V) EKG - 05/30/20 Stress Test - 08/24/12 ECHO - 07/21/17 Cardiac Cath - n/a  ERAS: Clears til 0715 DOS, no drink  STOP now taking any Aspirin (unless otherwise instructed by your surgeon), Aleve, Naproxen, Ibuprofen, Motrin, Advil, Goody's, BC's, all herbal medications, fish oil, and all vitamins.   Coronavirus Screening Covid test on 07/08/20 was negative  Patient verbalized understanding of instructions that were given via phone.

## 2020-07-10 ENCOUNTER — Ambulatory Visit (HOSPITAL_COMMUNITY): Payer: Medicare Other | Admitting: Certified Registered"

## 2020-07-10 ENCOUNTER — Encounter (HOSPITAL_COMMUNITY)
Admission: RE | Disposition: A | Discharge status: Home / Self Care | Payer: Self-pay | Source: Home / Self Care | Attending: Orthopedic Surgery

## 2020-07-10 ENCOUNTER — Other Ambulatory Visit: Payer: Self-pay

## 2020-07-10 ENCOUNTER — Ambulatory Visit (HOSPITAL_COMMUNITY)
Admission: RE | Admit: 2020-07-10 | Discharge: 2020-07-10 | Disposition: A | Discharge status: Home / Self Care | Payer: Medicare Other | Attending: Orthopedic Surgery | Admitting: Orthopedic Surgery

## 2020-07-10 ENCOUNTER — Encounter (HOSPITAL_COMMUNITY): Payer: Self-pay | Admitting: Orthopedic Surgery

## 2020-07-10 ENCOUNTER — Ambulatory Visit (HOSPITAL_COMMUNITY): Payer: Medicare Other

## 2020-07-10 DIAGNOSIS — J302 Other seasonal allergic rhinitis: Secondary | ICD-10-CM | POA: Insufficient documentation

## 2020-07-10 DIAGNOSIS — Z992 Dependence on renal dialysis: Secondary | ICD-10-CM | POA: Diagnosis not present

## 2020-07-10 DIAGNOSIS — T85848D Pain due to other internal prosthetic devices, implants and grafts, subsequent encounter: Secondary | ICD-10-CM

## 2020-07-10 DIAGNOSIS — I120 Hypertensive chronic kidney disease with stage 5 chronic kidney disease or end stage renal disease: Secondary | ICD-10-CM | POA: Insufficient documentation

## 2020-07-10 DIAGNOSIS — T8484XD Pain due to internal orthopedic prosthetic devices, implants and grafts, subsequent encounter: Secondary | ICD-10-CM | POA: Diagnosis not present

## 2020-07-10 DIAGNOSIS — N186 End stage renal disease: Secondary | ICD-10-CM | POA: Diagnosis not present

## 2020-07-10 DIAGNOSIS — T8484XA Pain due to internal orthopedic prosthetic devices, implants and grafts, initial encounter: Secondary | ICD-10-CM | POA: Diagnosis not present

## 2020-07-10 DIAGNOSIS — Y831 Surgical operation with implant of artificial internal device as the cause of abnormal reaction of the patient, or of later complication, without mention of misadventure at the time of the procedure: Secondary | ICD-10-CM | POA: Insufficient documentation

## 2020-07-10 DIAGNOSIS — E7849 Other hyperlipidemia: Secondary | ICD-10-CM | POA: Diagnosis not present

## 2020-07-10 DIAGNOSIS — M25571 Pain in right ankle and joints of right foot: Secondary | ICD-10-CM | POA: Diagnosis not present

## 2020-07-10 DIAGNOSIS — Z79899 Other long term (current) drug therapy: Secondary | ICD-10-CM | POA: Insufficient documentation

## 2020-07-10 HISTORY — DX: Unspecified osteoarthritis, unspecified site: M19.90

## 2020-07-10 HISTORY — PX: HARDWARE REMOVAL: SHX979

## 2020-07-10 LAB — POCT I-STAT, CHEM 8
BUN: 28 mg/dL — ABNORMAL HIGH (ref 8–23)
Calcium, Ion: 1.05 mmol/L — ABNORMAL LOW (ref 1.15–1.40)
Chloride: 94 mmol/L — ABNORMAL LOW (ref 98–111)
Creatinine, Ser: 7.9 mg/dL — ABNORMAL HIGH (ref 0.44–1.00)
Glucose, Bld: 83 mg/dL (ref 70–99)
HCT: 42 % (ref 36.0–46.0)
Hemoglobin: 14.3 g/dL (ref 12.0–15.0)
Potassium: 3.7 mmol/L (ref 3.5–5.1)
Sodium: 140 mmol/L (ref 135–145)
TCO2: 34 mmol/L — ABNORMAL HIGH (ref 22–32)

## 2020-07-10 SURGERY — REMOVAL, HARDWARE
Anesthesia: Monitor Anesthesia Care | Site: Ankle | Laterality: Right

## 2020-07-10 MED ORDER — OXYCODONE HCL 5 MG PO TABS
ORAL_TABLET | ORAL | Status: AC
  Filled 2020-07-10: qty 1

## 2020-07-10 MED ORDER — TRIAMCINOLONE ACETONIDE 40 MG/ML IJ SUSP
INTRAMUSCULAR | Status: AC
  Filled 2020-07-10: qty 5

## 2020-07-10 MED ORDER — 0.9 % SODIUM CHLORIDE (POUR BTL) OPTIME
TOPICAL | Status: DC | PRN
  Administered 2020-07-10: 1000 mL

## 2020-07-10 MED ORDER — VANCOMYCIN HCL 1000 MG IV SOLR
INTRAVENOUS | Status: AC
  Filled 2020-07-10: qty 1000

## 2020-07-10 MED ORDER — SODIUM CHLORIDE 0.9 % IV SOLN: INTRAVENOUS | Status: DC

## 2020-07-10 MED ORDER — VANCOMYCIN HCL IN DEXTROSE 1-5 GM/200ML-% IV SOLN
INTRAVENOUS | Status: AC
  Filled 2020-07-10: qty 200

## 2020-07-10 MED ORDER — LACTATED RINGERS IV SOLN: INTRAVENOUS | Status: DC

## 2020-07-10 MED ORDER — OXYCODONE HCL 5 MG/5ML PO SOLN: 5.0000 mg | Freq: Once | ORAL | Status: AC | PRN

## 2020-07-10 MED ORDER — CEFAZOLIN SODIUM-DEXTROSE 2-4 GM/100ML-% IV SOLN: 2.0000 g | INTRAVENOUS | Status: DC

## 2020-07-10 MED ORDER — FENTANYL CITRATE (PF) 100 MCG/2ML IJ SOLN
INTRAMUSCULAR | Status: AC
  Administered 2020-07-10: 50 ug via INTRAVENOUS
  Filled 2020-07-10: qty 2

## 2020-07-10 MED ORDER — FENTANYL CITRATE (PF) 100 MCG/2ML IJ SOLN: 25.0000 ug | INTRAMUSCULAR | Status: DC | PRN

## 2020-07-10 MED ORDER — VANCOMYCIN HCL IN DEXTROSE 1-5 GM/200ML-% IV SOLN
1000.0000 mg | Freq: Once | INTRAVENOUS | Status: AC
  Administered 2020-07-10: 1000 mg via INTRAVENOUS

## 2020-07-10 MED ORDER — VANCOMYCIN HCL 1000 MG IV SOLR
INTRAVENOUS | Status: DC | PRN
  Administered 2020-07-10: 1000 mg via TOPICAL

## 2020-07-10 MED ORDER — CHLORHEXIDINE GLUCONATE 0.12 % MT SOLN
OROMUCOSAL | Status: AC
  Administered 2020-07-10: 15 mL via OROMUCOSAL
  Filled 2020-07-10: qty 15

## 2020-07-10 MED ORDER — CHLORHEXIDINE GLUCONATE 0.12 % MT SOLN: 15.0000 mL | Freq: Once | OROMUCOSAL | Status: AC

## 2020-07-10 MED ORDER — LIDOCAINE 2% (20 MG/ML) 5 ML SYRINGE
INTRAMUSCULAR | Status: AC
  Filled 2020-07-10: qty 5

## 2020-07-10 MED ORDER — CEFAZOLIN SODIUM-DEXTROSE 2-3 GM-%(50ML) IV SOLR
INTRAVENOUS | Status: DC | PRN
  Administered 2020-07-10: 2 g via INTRAVENOUS

## 2020-07-10 MED ORDER — LIDOCAINE HCL (PF) 0.5 % IJ SOLN
INTRAMUSCULAR | Status: AC
  Filled 2020-07-10: qty 50

## 2020-07-10 MED ORDER — LIDOCAINE HCL (PF) 0.5 % IJ SOLN
INTRAMUSCULAR | Status: DC | PRN
  Administered 2020-07-10: 2 mL

## 2020-07-10 MED ORDER — ONDANSETRON HCL 4 MG/2ML IJ SOLN: 4.0000 mg | Freq: Four times a day (QID) | INTRAMUSCULAR | Status: DC | PRN

## 2020-07-10 MED ORDER — CEFAZOLIN SODIUM-DEXTROSE 2-4 GM/100ML-% IV SOLN
INTRAVENOUS | Status: AC
  Filled 2020-07-10: qty 100

## 2020-07-10 MED ORDER — ORAL CARE MOUTH RINSE: 15.0000 mL | Freq: Once | OROMUCOSAL | Status: AC

## 2020-07-10 MED ORDER — FENTANYL CITRATE (PF) 100 MCG/2ML IJ SOLN: 50.0000 ug | Freq: Once | INTRAMUSCULAR | Status: AC

## 2020-07-10 MED ORDER — SENNA 8.6 MG PO TABS: 2.0000 | ORAL_TABLET | Freq: Two times a day (BID) | ORAL | 0 refills | Status: DC

## 2020-07-10 MED ORDER — TRIAMCINOLONE ACETONIDE 40 MG/ML IJ SUSP
INTRAMUSCULAR | Status: DC | PRN
  Administered 2020-07-10: 40 mg

## 2020-07-10 MED ORDER — OXYCODONE HCL 5 MG PO TABS
5.0000 mg | ORAL_TABLET | Freq: Once | ORAL | Status: AC | PRN
  Administered 2020-07-10: 5 mg via ORAL

## 2020-07-10 MED ORDER — DOCUSATE SODIUM 100 MG PO CAPS: 100.0000 mg | ORAL_CAPSULE | Freq: Two times a day (BID) | ORAL | 0 refills | Status: DC

## 2020-07-10 MED ORDER — PROPOFOL 500 MG/50ML IV EMUL
INTRAVENOUS | Status: DC | PRN
  Administered 2020-07-10: 75 ug/kg/min via INTRAVENOUS

## 2020-07-10 MED ORDER — MIDAZOLAM HCL 2 MG/2ML IJ SOLN
INTRAMUSCULAR | Status: AC
  Filled 2020-07-10: qty 2

## 2020-07-10 MED ORDER — PROPOFOL 1000 MG/100ML IV EMUL
INTRAVENOUS | Status: AC
  Filled 2020-07-10: qty 100

## 2020-07-10 MED ORDER — BUPIVACAINE-EPINEPHRINE (PF) 0.5% -1:200000 IJ SOLN
INTRAMUSCULAR | Status: DC | PRN
  Administered 2020-07-10: 20 mL via PERINEURAL
  Administered 2020-07-10: 15 mL via PERINEURAL

## 2020-07-10 MED ORDER — OXYCODONE HCL 5 MG PO TABS
5.0000 mg | ORAL_TABLET | Freq: Four times a day (QID) | ORAL | 0 refills | Status: DC | PRN
Start: 2020-07-10 — End: 2020-08-25

## 2020-07-10 SURGICAL SUPPLY — 62 items
APL PRP STRL LF DISP 70% ISPRP (MISCELLANEOUS) ×1
BANDAGE ESMARK 6X9 LF (GAUZE/BANDAGES/DRESSINGS) ×1 IMPLANT
BLADE SURG 10 STRL SS (BLADE) ×3 IMPLANT
BNDG CMPR 9X6 STRL LF SNTH (GAUZE/BANDAGES/DRESSINGS) ×1
BNDG COHESIVE 4X5 TAN STRL (GAUZE/BANDAGES/DRESSINGS) ×3 IMPLANT
BNDG COHESIVE 6X5 TAN STRL LF (GAUZE/BANDAGES/DRESSINGS) ×1 IMPLANT
BNDG ESMARK 6X9 LF (GAUZE/BANDAGES/DRESSINGS) ×3
CANISTER SUCT 3000ML PPV (MISCELLANEOUS) ×3 IMPLANT
CHLORAPREP W/TINT 26 (MISCELLANEOUS) ×3 IMPLANT
COVER SURGICAL LIGHT HANDLE (MISCELLANEOUS) ×1 IMPLANT
COVER WAND RF STERILE (DRAPES) ×1 IMPLANT
CUFF TOURN SGL QUICK 34 (TOURNIQUET CUFF) ×3
CUFF TOURN SGL QUICK 42 (TOURNIQUET CUFF) IMPLANT
CUFF TRNQT CYL 34X4.125X (TOURNIQUET CUFF) ×1 IMPLANT
DRAPE C-ARM 42X72 X-RAY (DRAPES) ×3 IMPLANT
DRAPE OEC MINIVIEW 54X84 (DRAPES) ×2 IMPLANT
DRAPE U-SHAPE 47X51 STRL (DRAPES) ×3 IMPLANT
DRSG ADAPTIC 3X8 NADH LF (GAUZE/BANDAGES/DRESSINGS) IMPLANT
DRSG MEPITEL 4X7.2 (GAUZE/BANDAGES/DRESSINGS) ×2 IMPLANT
DRSG PAD ABDOMINAL 8X10 ST (GAUZE/BANDAGES/DRESSINGS) ×2 IMPLANT
ELECT REM PT RETURN 9FT ADLT (ELECTROSURGICAL) ×3
ELECTRODE REM PT RTRN 9FT ADLT (ELECTROSURGICAL) ×1 IMPLANT
GAUZE SPONGE 4X4 12PLY STRL (GAUZE/BANDAGES/DRESSINGS) ×2 IMPLANT
GAUZE SPONGE 4X4 12PLY STRL LF (GAUZE/BANDAGES/DRESSINGS) ×2 IMPLANT
GLOVE BIO SURGEON STRL SZ8 (GLOVE) ×3 IMPLANT
GLOVE BIOGEL PI IND STRL 8 (GLOVE) ×2 IMPLANT
GLOVE BIOGEL PI INDICATOR 8 (GLOVE) ×4
GLOVE ECLIPSE 8.0 STRL XLNG CF (GLOVE) ×3 IMPLANT
GLOVE SURG SS PI 7.0 STRL IVOR (GLOVE) ×4 IMPLANT
GOWN STRL REUS W/ TWL LRG LVL3 (GOWN DISPOSABLE) ×1 IMPLANT
GOWN STRL REUS W/ TWL XL LVL3 (GOWN DISPOSABLE) ×2 IMPLANT
GOWN STRL REUS W/TWL LRG LVL3 (GOWN DISPOSABLE) ×6
GOWN STRL REUS W/TWL XL LVL3 (GOWN DISPOSABLE) ×6
KIT BASIN OR (CUSTOM PROCEDURE TRAY) ×3 IMPLANT
KIT TURNOVER KIT B (KITS) ×3 IMPLANT
NDL HYPO 18GX1.5 BLUNT FILL (NEEDLE) IMPLANT
NEEDLE 22X1 1/2 (OR ONLY) (NEEDLE) ×2 IMPLANT
NEEDLE HYPO 18GX1.5 BLUNT FILL (NEEDLE) ×3 IMPLANT
NS IRRIG 1000ML POUR BTL (IV SOLUTION) ×3 IMPLANT
PACK ORTHO EXTREMITY (CUSTOM PROCEDURE TRAY) ×3 IMPLANT
PAD ABD 8X10 STRL (GAUZE/BANDAGES/DRESSINGS) ×2 IMPLANT
PAD ARMBOARD 7.5X6 YLW CONV (MISCELLANEOUS) ×6 IMPLANT
PAD CAST 4YDX4 CTTN HI CHSV (CAST SUPPLIES) ×1 IMPLANT
PADDING CAST COTTON 4X4 STRL (CAST SUPPLIES) ×3
SOAP 2 % CHG 4 OZ (WOUND CARE) ×3 IMPLANT
SPONGE LAP 4X18 RFD (DISPOSABLE) ×3 IMPLANT
STAPLER VISISTAT 35W (STAPLE) IMPLANT
SUCTION FRAZIER HANDLE 10FR (MISCELLANEOUS) ×3
SUCTION TUBE FRAZIER 10FR DISP (MISCELLANEOUS) ×1 IMPLANT
SUT ETHILON 3 0 PS 1 (SUTURE) ×2 IMPLANT
SUT PROLENE 3 0 PS 2 (SUTURE) ×3 IMPLANT
SUT VIC AB 2-0 CT1 27 (SUTURE) ×6
SUT VIC AB 2-0 CT1 TAPERPNT 27 (SUTURE) ×2 IMPLANT
SUT VIC AB 3-0 PS2 18 (SUTURE) ×3
SUT VIC AB 3-0 PS2 18XBRD (SUTURE) ×1 IMPLANT
SYR 5ML LL (SYRINGE) ×2 IMPLANT
SYR CONTROL 10ML LL (SYRINGE) IMPLANT
TOWEL GREEN STERILE (TOWEL DISPOSABLE) ×3 IMPLANT
TOWEL GREEN STERILE FF (TOWEL DISPOSABLE) ×3 IMPLANT
TUBE CONNECTING 12'X1/4 (SUCTIONS) ×1
TUBE CONNECTING 12X1/4 (SUCTIONS) ×2 IMPLANT
WATER STERILE IRR 1000ML POUR (IV SOLUTION) ×3 IMPLANT

## 2020-07-10 NOTE — Anesthesia Procedure Notes (Signed)
Anesthesia Regional Block: Adductor canal block   Pre-Anesthetic Checklist: ,, timeout performed, Correct Patient, Correct Site, Correct Laterality, Correct Procedure, Correct Position, site marked, Risks and benefits discussed,  Surgical consent,  Pre-op evaluation,  At surgeon's request and post-op pain management  Laterality: Right  Prep: chloraprep       Needles:  Injection technique: Single-shot  Needle Type: Echogenic Needle     Needle Length: 9cm  Needle Gauge: 21     Additional Needles:   Narrative:  Start time: 07/10/2020 10:05 AM End time: 07/10/2020 10:10 AM Injection made incrementally with aspirations every 5 mL.  Performed by: Personally  Anesthesiologist: Albertha Ghee, MD  Additional Notes: Pt tolerated the procedure well.

## 2020-07-10 NOTE — Anesthesia Procedure Notes (Signed)
Anesthesia Regional Block: Popliteal block   Pre-Anesthetic Checklist: ,, timeout performed, Correct Patient, Correct Site, Correct Laterality, Correct Procedure, Correct Position, site marked, Risks and benefits discussed,  Surgical consent,  Pre-op evaluation,  At surgeon's request and post-op pain management  Laterality: Right  Prep: chloraprep       Needles:  Injection technique: Single-shot  Needle Type: Echogenic Stimulator Needle          Additional Needles:   Procedures:, nerve stimulator,,,,,,,   Nerve Stimulator or Paresthesia:  Response: plantar flexion of foot, 0.45 mA,   Additional Responses:   Narrative:  Start time: 07/10/2020 9:57 AM End time: 07/10/2020 10:04 AM Injection made incrementally with aspirations every 5 mL.  Performed by: Personally  Anesthesiologist: Albertha Ghee, MD  Additional Notes: Functioning IV was confirmed and monitors were applied.  A 51mm 21ga Arrow echogenic stimulator needle was used. Sterile prep and drape,hand hygiene and sterile gloves were used.  Negative aspiration and negative test dose prior to incremental administration of local anesthetic. The patient tolerated the procedure well.  Ultrasound guidance: relevent anatomy identified, needle position confirmed, local anesthetic spread visualized around nerve(s), vascular puncture avoided.  Image printed for medical record.

## 2020-07-10 NOTE — H&P (Signed)
Victoria [name] is an 64 y.o. female.   Chief Complaint: right ankle pain HPI: The patient is a 64 year old female with a past medical history significant for end-stage renal disease requiring dialysis.  She has a history of right ankle pain with painful hardware at both the medial tibia and lateral fibula.  She presents now for removal of hardware.  She had dialysis yesterday.  Past Medical History:  Diagnosis Date  . Arthritis   . Chronic bronchitis ([place])   . Complication of anesthesia ~ 2011   "they gave me a medicine that swolled me and mouth burning up" (08/23/2012)  . Complication of anesthesia    slow to wake up  . ESRD (end stage renal disease) on dialysis [facility]) Nephrologist-- dr deterding   ESRD due to HTN-; "M/W/F; [place]" (11/28/2015  . GERD (gastroesophageal reflux disease)    no meds, diet controlled  . History of acute pulmonary edema    2003  . Hypertension    no longer on medications  . Left patella fracture   . Pneumonia    as a child  . Seasonal allergies   . Secondary hyperparathyroidism, renal ([place])    s/p  total parathyroidectomy  2014  . Thyroid disease   . Wears glasses   . Wound dehiscence, surgical 11/28/2015    Past Surgical History:  Procedure Laterality Date  . ANKLE FRACTURE SURGERY Right ~ 2005   "has pins in it"  . APPLICATION OF WOUND VAC Left 11/28/2015   knee  . APPLICATION OF WOUND VAC Left 11/28/2015   Procedure: APPLICATION OF WOUND VAC;  Surgeon: Rod Can, MD;  Location: [place];  Service: Orthopedics;  Laterality: Left;  . ARTERIOVENOUS GRAFT PLACEMENT  03-25-2005   Right forearm  w/  multiple Revision's   . CARDIOVASCULAR STRESS TEST  08-24-2012   abnormal nuclear study/  inferolateral and anteroseptal areas of scar,  no ischemia/  normal LV function and wall motion, ef 77%  . DIALYSIS FISTULA CREATION  1992   left upper arm ---  w/  Multiple Revision's until 2006  . ECTOPIC PREGNANCY SURGERY  1970's  . FRACTURE SURGERY    . I  & D EXTREMITY Left 11/28/2015   Procedure: IRRIGATION AND DEBRIDEMENT LEFT KNEE WOUND ;  Surgeon: Rod Can, MD;  Location: [place];  Service: Orthopedics;  Laterality: Left;  . INCISION AND DRAINAGE OF WOUND Left 11/28/2015   knee  . ORIF PATELLA Left 10/31/2015   Procedure: OPEN REDUCTION INTERNAL (ORIF) FIXATION LEFT PATELLA;  Surgeon: Rod Can, MD;  Location: [place];  Service: Orthopedics;  Laterality: Left;  . PATELLAR TENDON REPAIR  10/03/2012   Procedure: PATELLA TENDON REPAIR;  Surgeon: Marin Shutter, MD;  Location: [place];  Service: Orthopedics;  Laterality: Right;  . PATELLECTOMY  10/03/2012   Procedure: PATELLECTOMY;  Surgeon: Marin Shutter, MD;  Location: [place];  Service: Orthopedics;  Laterality: Right;  RIGHT PARTIAL PATELLECTOMY AND PATELLA TENDON REPAIR  . REVISION OF ARTERIOVENOUS GORETEX GRAFT  09/27/2012   Procedure: REVISION OF ARTERIOVENOUS GORETEX GRAFT;  Surgeon: Mal Misty, MD;  Location: [place];  Service: Vascular;  Laterality: Right;  1) Replacement of venous half of loop with 65mm Gortex graft  2) Excision of erroded pseudoaneurysm of graft with primary closure.  Marland Kitchen REVISION OF ARTERIOVENOUS GORETEX GRAFT Right 01/23/2013   Procedure: REVISION OF ARTERIOVENOUS GORETEX GRAFT;  Surgeon: Conrad Adrian, MD;  Location: [place];  Service: Vascular;  Laterality: Right;  Using piece of 27mm x 20cm Gortex graft.   Marland Kitchen REVISION OF ARTERIOVENOUS GORETEX GRAFT Right 10/07/2015   Procedure: REVISION OF Right arm ARTERIOVENOUS GORETEX GRAFT;  Surgeon: Elam Dutch, MD;  Location: [facility];  Service: Vascular;  Laterality: Right;  . REVISION OF ARTERIOVENOUS GORETEX GRAFT Right 02/17/2016   Procedure: REVISION OF ARTERIOVENOUS GORETEX GRAFT;  Surgeon: Elam Dutch, MD;  Location: [place];  Service: Vascular;  Laterality: Right;  . TOTAL ABDOMINAL HYSTERECTOMY  03-05-2005   w/  Right Ovarian Cystectomy  . TOTAL PARATHYROIDECTOMY/  THYROID ISTHMUSECTOMY/   AUTOTRANSPLANTATION PARATHYROID TISSUE TO LEFT BRACHIORIADIALIS MUSCLE  02-18-2011  . TRANSTHORACIC ECHOCARDIOGRAM  10-31-2012   mild LVH,  grade 1 diastolic dysfunction,  ef 55-65%/  mild MR/  trivial TR    Family History  Problem Relation Age of Onset  . Healthy Mother   . Hypertension Father   . Kidney failure Father   . Hypertension Sister   . Thyroid disease Sister    Social History:  reports that she has never smoked. She has never used smokeless tobacco. She reports previous alcohol use. She reports that she does not use drugs.  Allergies:  Allergies  Allergen Reactions  . Amlodipine Swelling  . Cefazolin     Other reaction(s): Unknown  . Cephalosporins Anaphylaxis  . Lisinopril Swelling and Other (See Comments)    Other reaction(s): Other (See Comments) "made my chest hurt" "made my chest hurt"    Medications Prior to Admission  Medication Sig Dispense Refill  . calcium acetate (PHOSLO) 667 MG capsule Take 2,001 mg by mouth 3 (three) times daily.     . fluticasone (FLONASE) 50 MCG/ACT nasal spray Place 2 sprays into both nostrils daily as needed.     . hydrALAZINE (APRESOLINE) 25 MG tablet Take 1 tablet (25 mg total) by mouth in the morning and at bedtime. (Patient not taking: Reported on 06/09/2020) 30 tablet 0  . Lactulose 20 GM/30ML SOLN Take 30 mLs (20 g total) by mouth in the morning and at bedtime. (Patient not taking: Reported on 05/30/2020) 236 mL 0  . omeprazole (PRILOSEC) 20 MG capsule Take 20 mg by mouth daily as needed (Relfux).  (Patient not taking: Reported on 06/09/2020)      Results for orders placed or performed during the hospital encounter of 07/10/20 (from the past 48 hour(s))  I-STAT, chem 8     Status: Abnormal   Collection Time: 07/10/20  9:29 AM  Result Value Ref Range   Sodium 140 135 - 145 mmol/L   Potassium 3.7 3.5 - 5.1 mmol/L   Chloride 94 (L) 98 - 111 mmol/L   BUN 28 (H) 8 - 23 mg/dL   Creatinine, Ser 7.90 (H) 0.44 - 1.00 mg/dL    Glucose, Bld 83 70 - 99 mg/dL    Comment: Glucose reference range applies only to samples taken after fasting for at least 8 hours.   Calcium, Ion 1.05 (L) 1.15 - 1.40 mmol/L   TCO2 34 (H) 22 - 32 mmol/L   Hemoglobin 14.3 12.0 - 15.0 g/dL   HCT 42.0 36 - 46 %   No results found.  Review of Systems no recent fever, chills, nausea, vomiting or changes in her appetite  Blood pressure (!) 147/91, pulse 75, temperature (!) 97.2 F (36.2 C), temperature source Temporal, resp. rate 18, height 5' 3.5" (1.613 m), weight 75.8 kg, SpO2 97 %. Physical Exam  Well-nourished well-developed woman in no apparent distress.  Alert and oriented.  Normal mood and affect.  Right ankle has healed surgical incisions medially and laterally.  No significant swelling.  Intact sensibility to light touch dorsally and plantarly at the forefoot.  5 out of 5 strength in plantarflexion and dorsiflexion of the ankle and toes.  Assessment/Plan Painful hardware right tibia and right fibula -to the operating room today for removal of deep implants as well as intra-articular steroid injection.  The risks and benefits of the alternative treatment options have been discussed in detail.  The patient wishes to proceed with surgery and specifically understands risks of bleeding, infection, nerve damage, blood clots, need for additional surgery, amputation and death.   Wylene Simmer, MD 07/17/20, 10:26 AM

## 2020-07-10 NOTE — Op Note (Signed)
07/10/2020  11:44 AM  PATIENT:  Victoria [name]  64 y.o. female  PRE-OPERATIVE DIAGNOSIS: 1.  Painful hardware right fibula   `   2.  Painful hardware right tibia      3.  Right ankle pain  POST-OPERATIVE DIAGNOSIS:  Same  Procedure(s): 1.  Removal of deep implants right fibula   2.  Removal of deep implants right tibia   3.  Intra articular steroid injection right ankle   4.  AP and lateral xrays of the right ankle   SURGEON:  Wylene Simmer, MD  ASSISTANT: Mechele Claude, PA-C  ANESTHESIA:   Regional, mac  EBL:  minimal   TOURNIQUET:  < 20 min with ankle esmarch  COMPLICATIONS:  None apparent  DISPOSITION:  Extubated, awake and stable to recovery.  INDICATION FOR PROCEDURE: The patient is a 64 year old female with past medical history significant for end-stage renal disease and dialysis.  She fractured her right ankle and has healed after ORIF by another surgeon.  She presents now for removal of hardware from her painful distal tibia and distal fibula.  The risks and benefits of the alternative treatment options have been discussed in detail.  The patient wishes to proceed with surgery and specifically understands risks of bleeding, infection, nerve damage, blood clots, need for additional surgery, amputation and death.  PROCEDURE IN DETAIL:  After pre operative consent was obtained, and the correct operative site was identified, the patient was brought to the operating room and placed supine on the OR table.  Anesthesia was administered.  Pre-operative antibiotics were administered.  A surgical timeout was taken.  The right lower extremity was prepped and draped in standard sterile fashion.  The foot was exsanguinated and an Esmarch tourniquet wrapped around the mid leg.  The lateral incision was opened again sharply and dissection was carried down through the subcutaneous tissues.  Superficial aspect of the plate was identified.  Was cleaned of all fibrous scar tissue.  All 7 screws  were removed without difficulty.  The plate was then removed with an osteotome.  The wound was irrigated and sprinkled with vancomycin powder.  Subcutaneous tissues were approximated with Monocryl and the skin incision was closed with nylon.  Attention was turned to the medial side of the ankle where the distal portion of the incision was opened again sharply.  Dissection was carried down through the subcutaneous tissues.  The posterior screw head was identified.  It was cleaned of all hematoma.  It was removed without difficulty.  A dental pick was used to remove the adjacent washer.  The more anterior screw was then removed in the same fashion.  AP and lateral radiographs of the ankle showed complete removal of all hardware from the distal tibia and distal fibula.  An intra-articular steroid injection was then administered under fluoroscopic guidance through an anteromedial injection site.  Sterile dressings were then applied to the ankle followed by compression wrap.  The tourniquet was released after application of the dressings.  The patient was awakened from anesthesia and transported to the recovery room in stable condition.    FOLLOW UP PLAN: Weightbearing as tolerated on the right foot.  Resume dialysis tomorrow.  Follow-up in the office in 2 weeks for suture removal.   RADIOGRAPHS: AP and lateral radiographs of the right ankle are obtained intraoperatively.  These show interval removal of hardware from the medial and lateral malleolus I.  Intra-articular placement of the needle was identified for steroid injection.  No other  acute injuries are noted.  Early degenerative changes are identified at the tibiotalar joint.    Mechele Claude PA-C was present and scrubbed for the duration of the operative case. His assistance was essential in positioning the patient, prepping and draping, gaining and maintaining exposure, performing the operation, closing and dressing the wounds and applying the  splint.

## 2020-07-10 NOTE — Transfer of Care (Signed)
Immediate Anesthesia Transfer of Care Note  Patient: Victoria [name]  Procedure(s) Performed: Removal of deep implant right fibula and tibia; steroid injection right ankle (Right Ankle)  Patient Location: PACU  Anesthesia Type:MAC and Regional  Level of Consciousness: awake, alert  and oriented  Airway & Oxygen Therapy: Patient Spontanous Breathing  Post-op Assessment: Report given to RN and Post -op Vital signs reviewed and stable  Post vital signs: Reviewed and stable  Last Vitals:  Vitals Value Taken Time  BP 125/84 07/10/20 1152  Temp    Pulse 73 07/10/20 1154  Resp 19 07/10/20 1154  SpO2 100 % 07/10/20 1154  Vitals shown include unvalidated device data.  Last Pain:  Vitals:   07/10/20 0914  TempSrc:   PainSc: 0-No pain      Patients Stated Pain Goal: 4 (84/69/62 9528)  Complications: No complications documented.

## 2020-07-10 NOTE — Anesthesia Preprocedure Evaluation (Signed)
Anesthesia Evaluation  Patient identified by MRN, date of birth, ID band Patient awake    Reviewed: Allergy & Precautions, H&P , NPO status , Patient's Chart, lab work & pertinent test results  Airway Mallampati: II   Neck ROM: full    Dental   Pulmonary neg pulmonary ROS,    breath sounds clear to auscultation       Cardiovascular hypertension,  Rhythm:regular Rate:Normal     Neuro/Psych  Neuromuscular disease    GI/Hepatic GERD  ,  Endo/Other    Renal/GU ESRF and DialysisRenal disease     Musculoskeletal  (+) Arthritis ,   Abdominal   Peds  Hematology   Anesthesia Other Findings   Reproductive/Obstetrics                             Anesthesia Physical Anesthesia Plan  ASA: IV  Anesthesia Plan: MAC and Regional   Post-op Pain Management:    Induction: Intravenous  PONV Risk Score and Plan: 2 and Ondansetron, Propofol infusion and Treatment may vary due to age or medical condition  Airway Management Planned: Simple Face Mask  Additional Equipment:   Intra-op Plan:   Post-operative Plan:   Informed Consent: I have reviewed the patients History and Physical, chart, labs and discussed the procedure including the risks, benefits and alternatives for the proposed anesthesia with the patient or authorized representative who has indicated his/her understanding and acceptance.       Plan Discussed with: CRNA, Anesthesiologist and Surgeon  Anesthesia Plan Comments:         Anesthesia Quick Evaluation

## 2020-07-10 NOTE — Discharge Instructions (Addendum)
Victoria Simmer, MD [facility]  Please read the following information regarding your care after surgery.  Medications  You only need a prescription for the narcotic pain medicine (ex. oxycodone, Percocet, Norco).  All of the other medicines listed below are available over the counter. X acetominophen (Tylenol) 650 mg every 4-6 hours as you need for minor to moderate pain X oxycodone as prescribed for severe pain  Narcotic pain medicine (ex. oxycodone, Percocet, Vicodin) will cause constipation.  To prevent this problem, take the following medicines while you are taking any pain medicine. X docusate sodium (Colace) 100 mg twice a day X senna (Senokot) 2 tablets twice a day   Weight Bearing X Bear weight when you are able on your operated leg or foot.   Cast / Splint / Dressing X Keep your splint, cast or dressing clean and dry.  Don't put anything (coat hanger, pencil, etc) down inside of it.  If it gets damp, use a hair dryer on the cool setting to dry it.  If it gets soaked, call the office to schedule an appointment for a cast change.   After your dressing, cast or splint is removed; you may shower, but do not soak or scrub the wound.  Allow the water to run over it, and then gently pat it dry.  Swelling It is normal for you to have swelling where you had surgery.  To reduce swelling and pain, keep your toes above your nose for at least 3 days after surgery.  It may be necessary to keep your foot or leg elevated for several weeks.  If it hurts, it should be elevated.  Follow Up Call my office at [phone] when you are discharged from the hospital or surgery center to schedule an appointment to be seen two weeks after surgery.  Call my office at [phone] if you develop a fever >101.5 F, nausea, vomiting, bleeding from the surgical site or severe pain.

## 2020-07-10 NOTE — Progress Notes (Signed)
Patient has a documented hx of anaphylaxis reaction from cephalosporins back in 2014.  Patient confirmed that she was having surgery in 2014 they gave her the antibiotic she has throat swelling and difficulty breathing so the surgery at that time was stopped.  Patient had ancef 2g ordered today for surgery.  Pharmacy contacted RN today suggesting that the antibiotic be changed to Vancomycin 1000 mg.   RN called Dr. Wylene Simmer to change orders to Vancomycin.  Verbal order received.  Colletta Maryland CRNA called Rn to talk about antibiotic. Colletta Maryland stated that patient had received Ancef 2g in 2017 with another surgery with no documented reactions from that encounter.    RN will verify again with Dr. Doran Durand when he arrives to short stay for more clarification.

## 2020-07-11 ENCOUNTER — Encounter (HOSPITAL_COMMUNITY): Payer: Self-pay | Admitting: Orthopedic Surgery

## 2020-07-11 DIAGNOSIS — Z992 Dependence on renal dialysis: Secondary | ICD-10-CM | POA: Diagnosis not present

## 2020-07-11 DIAGNOSIS — N2581 Secondary hyperparathyroidism of renal origin: Secondary | ICD-10-CM | POA: Diagnosis not present

## 2020-07-11 DIAGNOSIS — N186 End stage renal disease: Secondary | ICD-10-CM | POA: Diagnosis not present

## 2020-07-11 DIAGNOSIS — D509 Iron deficiency anemia, unspecified: Secondary | ICD-10-CM | POA: Diagnosis not present

## 2020-07-11 NOTE — Anesthesia Postprocedure Evaluation (Signed)
Anesthesia Post Note  Patient: Victoria [name]  Procedure(s) Performed: Removal of deep implant right fibula and tibia; steroid injection right ankle (Right Ankle)     Patient location during evaluation: PACU Anesthesia Type: Regional and MAC Level of consciousness: awake and alert Pain management: pain level controlled Vital Signs Assessment: post-procedure vital signs reviewed and stable Respiratory status: spontaneous breathing, nonlabored ventilation, respiratory function stable and patient connected to nasal cannula oxygen Cardiovascular status: stable and blood pressure returned to baseline Postop Assessment: no apparent nausea or vomiting Anesthetic complications: no   No complications documented.  Last Vitals:  Vitals:   07/10/20 1223 07/10/20 1238  BP: (!) 151/95 (!) 144/90  Pulse: 71 (!) 59  Resp: 17 15  Temp:    SpO2: 99% 100%    Last Pain:  Vitals:   07/10/20 0914  TempSrc:   PainSc: 0-No pain                 Jerra Huckeby S

## 2020-07-14 DIAGNOSIS — N2581 Secondary hyperparathyroidism of renal origin: Secondary | ICD-10-CM | POA: Diagnosis not present

## 2020-07-14 DIAGNOSIS — D509 Iron deficiency anemia, unspecified: Secondary | ICD-10-CM | POA: Diagnosis not present

## 2020-07-14 DIAGNOSIS — N186 End stage renal disease: Secondary | ICD-10-CM | POA: Diagnosis not present

## 2020-07-14 DIAGNOSIS — Z992 Dependence on renal dialysis: Secondary | ICD-10-CM | POA: Diagnosis not present

## 2020-07-15 ENCOUNTER — Other Ambulatory Visit: Payer: Self-pay

## 2020-07-15 ENCOUNTER — Ambulatory Visit (INDEPENDENT_AMBULATORY_CARE_PROVIDER_SITE_OTHER): Payer: Medicare Other | Admitting: Nurse Practitioner

## 2020-07-15 ENCOUNTER — Encounter: Payer: Self-pay | Admitting: Nurse Practitioner

## 2020-07-15 VITALS — BP 124/70 | HR 75 | Temp 97.7°F | Ht 66.0 in | Wt 167.2 lb

## 2020-07-15 DIAGNOSIS — I10 Essential (primary) hypertension: Secondary | ICD-10-CM | POA: Diagnosis not present

## 2020-07-15 DIAGNOSIS — Z1159 Encounter for screening for other viral diseases: Secondary | ICD-10-CM | POA: Diagnosis not present

## 2020-07-15 DIAGNOSIS — Z23 Encounter for immunization: Secondary | ICD-10-CM

## 2020-07-15 DIAGNOSIS — Z1211 Encounter for screening for malignant neoplasm of colon: Secondary | ICD-10-CM

## 2020-07-15 LAB — LIPID PANEL
Cholesterol: 163 mg/dL (ref 0–200)
HDL: 47.3 mg/dL (ref >39.00)
LDL Cholesterol: 91 mg/dL (ref 0–99)
NonHDL: 115.49
Total CHOL/HDL Ratio: 3
Triglycerides: 120 mg/dL (ref 0.0–149.0)
VLDL: 24 mg/dL (ref 0.0–40.0)

## 2020-07-15 LAB — TSH: TSH: 3.6 u[IU]/mL (ref 0.35–4.50)

## 2020-07-15 NOTE — Progress Notes (Signed)
Subjective:  Patient ID: Victoria [name], female    DOB: 17-Oct-1955  Age: 64 y.o. MRN: [id number]  CC: Follow-up (4 week f/u on HTN, Hyperlipidemia, and Thyroid. )  HPI Ms. Victoria [name] submitted stool sample for cologuard but company was not able to process. She is requesting a referral to GI for routine colonoscopy.  HTN (hypertension) Improved BP control under the direction of nephrology. BP Readings from Last 3 Encounters:  07/15/20 124/70  07/10/20 (!) 144/90  06/09/20 140/60   Wt Readings from Last 3 Encounters:  07/15/20 167 lb 3.2 oz (75.8 kg)  07/10/20 167 lb (75.8 kg)  06/24/20 167 lb (75.8 kg)  reviewed CBC and CMP completed during recent hospital stay.  Reviewed past Medical, Social and Family history today.  Outpatient Medications Prior to Visit  Medication Sig Dispense Refill  . calcium acetate (PHOSLO) 667 MG capsule Take 2,001 mg by mouth 3 (three) times daily.     Marland Kitchen docusate sodium (COLACE) 100 MG capsule Take 1 capsule (100 mg total) by mouth 2 (two) times daily. While taking narcotic pain medicine. 30 capsule 0  . fluticasone (FLONASE) 50 MCG/ACT nasal spray Place 2 sprays into both nostrils daily as needed.     Marland Kitchen oxyCODONE (ROXICODONE) 5 MG immediate release tablet Take 1 tablet (5 mg total) by mouth every 6 (six) hours as needed for moderate pain or severe pain. 20 tablet 0  . senna (SENOKOT) 8.6 MG TABS tablet Take 2 tablets (17.2 mg total) by mouth 2 (two) times daily. 30 tablet 0  . hydrALAZINE (APRESOLINE) 25 MG tablet Take 1 tablet (25 mg total) by mouth in the morning and at bedtime. (Patient not taking: Reported on 06/09/2020) 30 tablet 0  . Lactulose 20 GM/30ML SOLN Take 30 mLs (20 g total) by mouth in the morning and at bedtime. (Patient not taking: Reported on 05/30/2020) 236 mL 0  . omeprazole (PRILOSEC) 20 MG capsule Take 20 mg by mouth daily as needed (Relfux).  (Patient not taking: Reported on 06/09/2020)     No facility-administered medications prior to  visit.    ROS See HPI  Objective:  BP 124/70 (BP Location: Left Arm, Patient Position: Sitting, Cuff Size: Normal)   Pulse 75   Temp 97.7 F (36.5 C) (Temporal)   Ht 5\' 6"  (1.676 m)   Wt 167 lb 3.2 oz (75.8 kg)   LMP  (LMP Unknown)   SpO2 99%   BMI 26.99 kg/m   Physical Exam Vitals reviewed.  Cardiovascular:     Rate and Rhythm: Normal rate and regular rhythm.     Pulses: Normal pulses.     Heart sounds: Normal heart sounds.  Pulmonary:     Effort: Pulmonary effort is normal.     Breath sounds: Normal breath sounds.  Musculoskeletal:     Right lower leg: No edema.     Left lower leg: No edema.  Neurological:     Mental Status: She is alert and oriented to person, place, and time.    Assessment & Plan:  This visit occurred during the SARS-CoV-2 public health emergency.  Safety protocols were in place, including screening questions prior to the visit, additional usage of staff PPE, and extensive cleaning of exam room while observing appropriate contact time as indicated for disinfecting solutions.   Victoria [name] was seen today for follow-up.  Diagnoses and all orders for this visit:  Primary hypertension -     Lipid panel -     TSH  Colon cancer screening -     Ambulatory referral to Gastroenterology  Encounter for hepatitis C screening test for low risk patient -     Hepatitis C Antibody  Need for prophylactic vaccination with Streptococcus pneumoniae (Pneumococcus) and Influenza vaccines -     Flu Vaccine QUAD High Dose(Fluad) -     Pneumococcal polysaccharide vaccine 23-valent greater than or equal to 2yo subcutaneous/IM   Problem List Items Addressed This Visit      Cardiovascular and Mediastinum   HTN (hypertension) - Primary    Improved BP control under the direction of nephrology. BP Readings from Last 3 Encounters:  07/15/20 124/70  07/10/20 (!) 144/90  06/09/20 140/60        Relevant Orders   Lipid panel (Completed)   TSH (Completed)    Other  Visit Diagnoses    Colon cancer screening       Relevant Orders   Ambulatory referral to Gastroenterology   Encounter for hepatitis C screening test for low risk patient       Relevant Orders   Hepatitis C Antibody   Need for prophylactic vaccination with Streptococcus pneumoniae (Pneumococcus) and Influenza vaccines       Relevant Orders   Flu Vaccine QUAD High Dose(Fluad) (Completed)   Pneumococcal polysaccharide vaccine 23-valent greater than or equal to 2yo subcutaneous/IM (Completed)      Follow-up: Return in about 6 months (around 01/13/2021).  Wilfred Lacy, NP

## 2020-07-15 NOTE — Patient Instructions (Addendum)
Go to lab for blood draw You will be contacted to schedule appt with GI to discuss colon cancer screen.  Sign medical release form to get records from Dr. Helane Rima (GYN).

## 2020-07-15 NOTE — Assessment & Plan Note (Signed)
Improved BP control under the direction of nephrology. BP Readings from Last 3 Encounters:  07/15/20 124/70  07/10/20 (!) 144/90  06/09/20 140/60

## 2020-07-16 DIAGNOSIS — D509 Iron deficiency anemia, unspecified: Secondary | ICD-10-CM | POA: Diagnosis not present

## 2020-07-16 DIAGNOSIS — Z992 Dependence on renal dialysis: Secondary | ICD-10-CM | POA: Diagnosis not present

## 2020-07-16 DIAGNOSIS — N2581 Secondary hyperparathyroidism of renal origin: Secondary | ICD-10-CM | POA: Diagnosis not present

## 2020-07-16 DIAGNOSIS — N186 End stage renal disease: Secondary | ICD-10-CM | POA: Diagnosis not present

## 2020-07-16 LAB — HEPATITIS C ANTIBODY
Hepatitis C Ab: NONREACTIVE
SIGNAL TO CUT-OFF: 0.05 (ref <1.00)

## 2020-07-18 DIAGNOSIS — D509 Iron deficiency anemia, unspecified: Secondary | ICD-10-CM | POA: Diagnosis not present

## 2020-07-18 DIAGNOSIS — Z992 Dependence on renal dialysis: Secondary | ICD-10-CM | POA: Diagnosis not present

## 2020-07-18 DIAGNOSIS — N186 End stage renal disease: Secondary | ICD-10-CM | POA: Diagnosis not present

## 2020-07-18 DIAGNOSIS — N2581 Secondary hyperparathyroidism of renal origin: Secondary | ICD-10-CM | POA: Diagnosis not present

## 2020-07-21 DIAGNOSIS — Z992 Dependence on renal dialysis: Secondary | ICD-10-CM | POA: Diagnosis not present

## 2020-07-21 DIAGNOSIS — D509 Iron deficiency anemia, unspecified: Secondary | ICD-10-CM | POA: Diagnosis not present

## 2020-07-21 DIAGNOSIS — N186 End stage renal disease: Secondary | ICD-10-CM | POA: Diagnosis not present

## 2020-07-21 DIAGNOSIS — N2581 Secondary hyperparathyroidism of renal origin: Secondary | ICD-10-CM | POA: Diagnosis not present

## 2020-07-21 DIAGNOSIS — I120 Hypertensive chronic kidney disease with stage 5 chronic kidney disease or end stage renal disease: Secondary | ICD-10-CM | POA: Diagnosis not present

## 2020-07-23 DIAGNOSIS — N2581 Secondary hyperparathyroidism of renal origin: Secondary | ICD-10-CM | POA: Diagnosis not present

## 2020-07-23 DIAGNOSIS — Z992 Dependence on renal dialysis: Secondary | ICD-10-CM | POA: Diagnosis not present

## 2020-07-23 DIAGNOSIS — N186 End stage renal disease: Secondary | ICD-10-CM | POA: Diagnosis not present

## 2020-07-23 DIAGNOSIS — D509 Iron deficiency anemia, unspecified: Secondary | ICD-10-CM | POA: Diagnosis not present

## 2020-07-25 DIAGNOSIS — N186 End stage renal disease: Secondary | ICD-10-CM | POA: Diagnosis not present

## 2020-07-25 DIAGNOSIS — Z992 Dependence on renal dialysis: Secondary | ICD-10-CM | POA: Diagnosis not present

## 2020-07-25 DIAGNOSIS — N2581 Secondary hyperparathyroidism of renal origin: Secondary | ICD-10-CM | POA: Diagnosis not present

## 2020-07-25 DIAGNOSIS — D509 Iron deficiency anemia, unspecified: Secondary | ICD-10-CM | POA: Diagnosis not present

## 2020-07-28 DIAGNOSIS — D509 Iron deficiency anemia, unspecified: Secondary | ICD-10-CM | POA: Diagnosis not present

## 2020-07-28 DIAGNOSIS — Z992 Dependence on renal dialysis: Secondary | ICD-10-CM | POA: Diagnosis not present

## 2020-07-28 DIAGNOSIS — N2581 Secondary hyperparathyroidism of renal origin: Secondary | ICD-10-CM | POA: Diagnosis not present

## 2020-07-28 DIAGNOSIS — N186 End stage renal disease: Secondary | ICD-10-CM | POA: Diagnosis not present

## 2020-07-29 ENCOUNTER — Encounter: Payer: Self-pay | Admitting: Gastroenterology

## 2020-07-30 DIAGNOSIS — N186 End stage renal disease: Secondary | ICD-10-CM | POA: Diagnosis not present

## 2020-07-30 DIAGNOSIS — Z992 Dependence on renal dialysis: Secondary | ICD-10-CM | POA: Diagnosis not present

## 2020-07-30 DIAGNOSIS — N2581 Secondary hyperparathyroidism of renal origin: Secondary | ICD-10-CM | POA: Diagnosis not present

## 2020-07-30 DIAGNOSIS — D509 Iron deficiency anemia, unspecified: Secondary | ICD-10-CM | POA: Diagnosis not present

## 2020-08-01 DIAGNOSIS — Z992 Dependence on renal dialysis: Secondary | ICD-10-CM | POA: Diagnosis not present

## 2020-08-01 DIAGNOSIS — N2581 Secondary hyperparathyroidism of renal origin: Secondary | ICD-10-CM | POA: Diagnosis not present

## 2020-08-01 DIAGNOSIS — D509 Iron deficiency anemia, unspecified: Secondary | ICD-10-CM | POA: Diagnosis not present

## 2020-08-01 DIAGNOSIS — N186 End stage renal disease: Secondary | ICD-10-CM | POA: Diagnosis not present

## 2020-08-04 DIAGNOSIS — D509 Iron deficiency anemia, unspecified: Secondary | ICD-10-CM | POA: Diagnosis not present

## 2020-08-04 DIAGNOSIS — N186 End stage renal disease: Secondary | ICD-10-CM | POA: Diagnosis not present

## 2020-08-04 DIAGNOSIS — Z992 Dependence on renal dialysis: Secondary | ICD-10-CM | POA: Diagnosis not present

## 2020-08-04 DIAGNOSIS — N2581 Secondary hyperparathyroidism of renal origin: Secondary | ICD-10-CM | POA: Diagnosis not present

## 2020-08-06 DIAGNOSIS — N2581 Secondary hyperparathyroidism of renal origin: Secondary | ICD-10-CM | POA: Diagnosis not present

## 2020-08-06 DIAGNOSIS — D509 Iron deficiency anemia, unspecified: Secondary | ICD-10-CM | POA: Diagnosis not present

## 2020-08-06 DIAGNOSIS — N186 End stage renal disease: Secondary | ICD-10-CM | POA: Diagnosis not present

## 2020-08-06 DIAGNOSIS — Z992 Dependence on renal dialysis: Secondary | ICD-10-CM | POA: Diagnosis not present

## 2020-08-08 DIAGNOSIS — Z992 Dependence on renal dialysis: Secondary | ICD-10-CM | POA: Diagnosis not present

## 2020-08-08 DIAGNOSIS — N186 End stage renal disease: Secondary | ICD-10-CM | POA: Diagnosis not present

## 2020-08-08 DIAGNOSIS — D509 Iron deficiency anemia, unspecified: Secondary | ICD-10-CM | POA: Diagnosis not present

## 2020-08-08 DIAGNOSIS — N2581 Secondary hyperparathyroidism of renal origin: Secondary | ICD-10-CM | POA: Diagnosis not present

## 2020-08-10 DIAGNOSIS — Z992 Dependence on renal dialysis: Secondary | ICD-10-CM | POA: Diagnosis not present

## 2020-08-10 DIAGNOSIS — N2581 Secondary hyperparathyroidism of renal origin: Secondary | ICD-10-CM | POA: Diagnosis not present

## 2020-08-10 DIAGNOSIS — D509 Iron deficiency anemia, unspecified: Secondary | ICD-10-CM | POA: Diagnosis not present

## 2020-08-10 DIAGNOSIS — N186 End stage renal disease: Secondary | ICD-10-CM | POA: Diagnosis not present

## 2020-08-12 DIAGNOSIS — D509 Iron deficiency anemia, unspecified: Secondary | ICD-10-CM | POA: Diagnosis not present

## 2020-08-12 DIAGNOSIS — N2581 Secondary hyperparathyroidism of renal origin: Secondary | ICD-10-CM | POA: Diagnosis not present

## 2020-08-12 DIAGNOSIS — N186 End stage renal disease: Secondary | ICD-10-CM | POA: Diagnosis not present

## 2020-08-12 DIAGNOSIS — Z992 Dependence on renal dialysis: Secondary | ICD-10-CM | POA: Diagnosis not present

## 2020-08-15 DIAGNOSIS — N2581 Secondary hyperparathyroidism of renal origin: Secondary | ICD-10-CM | POA: Diagnosis not present

## 2020-08-15 DIAGNOSIS — D509 Iron deficiency anemia, unspecified: Secondary | ICD-10-CM | POA: Diagnosis not present

## 2020-08-15 DIAGNOSIS — N186 End stage renal disease: Secondary | ICD-10-CM | POA: Diagnosis not present

## 2020-08-15 DIAGNOSIS — Z992 Dependence on renal dialysis: Secondary | ICD-10-CM | POA: Diagnosis not present

## 2020-08-18 DIAGNOSIS — N2581 Secondary hyperparathyroidism of renal origin: Secondary | ICD-10-CM | POA: Diagnosis not present

## 2020-08-18 DIAGNOSIS — Z992 Dependence on renal dialysis: Secondary | ICD-10-CM | POA: Diagnosis not present

## 2020-08-18 DIAGNOSIS — N186 End stage renal disease: Secondary | ICD-10-CM | POA: Diagnosis not present

## 2020-08-18 DIAGNOSIS — D509 Iron deficiency anemia, unspecified: Secondary | ICD-10-CM | POA: Diagnosis not present

## 2020-08-20 DIAGNOSIS — Z992 Dependence on renal dialysis: Secondary | ICD-10-CM | POA: Diagnosis not present

## 2020-08-20 DIAGNOSIS — N186 End stage renal disease: Secondary | ICD-10-CM | POA: Diagnosis not present

## 2020-08-20 DIAGNOSIS — I120 Hypertensive chronic kidney disease with stage 5 chronic kidney disease or end stage renal disease: Secondary | ICD-10-CM | POA: Diagnosis not present

## 2020-08-20 DIAGNOSIS — N2581 Secondary hyperparathyroidism of renal origin: Secondary | ICD-10-CM | POA: Diagnosis not present

## 2020-08-20 DIAGNOSIS — D509 Iron deficiency anemia, unspecified: Secondary | ICD-10-CM | POA: Diagnosis not present

## 2020-08-22 DIAGNOSIS — Z992 Dependence on renal dialysis: Secondary | ICD-10-CM | POA: Diagnosis not present

## 2020-08-22 DIAGNOSIS — N186 End stage renal disease: Secondary | ICD-10-CM | POA: Diagnosis not present

## 2020-08-22 DIAGNOSIS — D509 Iron deficiency anemia, unspecified: Secondary | ICD-10-CM | POA: Diagnosis not present

## 2020-08-22 DIAGNOSIS — N2581 Secondary hyperparathyroidism of renal origin: Secondary | ICD-10-CM | POA: Diagnosis not present

## 2020-08-25 ENCOUNTER — Ambulatory Visit (HOSPITAL_COMMUNITY)
Admission: EM | Admit: 2020-08-25 | Discharge: 2020-08-25 | Disposition: A | Discharge status: Home / Self Care | Payer: Medicare Other | Attending: Internal Medicine | Admitting: Internal Medicine

## 2020-08-25 ENCOUNTER — Other Ambulatory Visit: Payer: Self-pay

## 2020-08-25 DIAGNOSIS — K610 Anal abscess: Secondary | ICD-10-CM | POA: Diagnosis not present

## 2020-08-25 DIAGNOSIS — Z992 Dependence on renal dialysis: Secondary | ICD-10-CM | POA: Diagnosis not present

## 2020-08-25 DIAGNOSIS — D509 Iron deficiency anemia, unspecified: Secondary | ICD-10-CM | POA: Diagnosis not present

## 2020-08-25 DIAGNOSIS — N2581 Secondary hyperparathyroidism of renal origin: Secondary | ICD-10-CM | POA: Diagnosis not present

## 2020-08-25 DIAGNOSIS — N186 End stage renal disease: Secondary | ICD-10-CM | POA: Diagnosis not present

## 2020-08-25 MED ORDER — CIPROFLOXACIN HCL 250 MG PO TABS: 500.0000 mg | ORAL_TABLET | Freq: Every day | ORAL | 0 refills | Status: DC

## 2020-08-25 MED ORDER — SULFAMETHOXAZOLE-TRIMETHOPRIM 800-160 MG PO TABS: 1.0000 | ORAL_TABLET | Freq: Two times a day (BID) | ORAL | 0 refills | Status: DC

## 2020-08-25 MED ORDER — METRONIDAZOLE 500 MG PO TABS: 500.0000 mg | ORAL_TABLET | Freq: Three times a day (TID) | ORAL | 0 refills | Status: AC

## 2020-08-25 MED ORDER — CIPROFLOXACIN HCL 250 MG PO TABS: 250.0000 mg | ORAL_TABLET | Freq: Every day | ORAL | 0 refills | Status: AC

## 2020-08-25 NOTE — ED Triage Notes (Signed)
PT reports having a boil on Lower Lt buttocks since Thursday last week.

## 2020-08-25 NOTE — Discharge Instructions (Addendum)
Please continue sitz bath's Take antibiotics as tolerated NSAIDs/Tylenol for pain If you notice worsening pain, discharge, worsening swelling please return to urgent care to be reevaluated.

## 2020-08-25 NOTE — ED Notes (Signed)
Patient getting into gown

## 2020-08-25 NOTE — ED Provider Notes (Signed)
[place]    CSN: [id number] Arrival date & time: 08/25/20  1000      History   Chief Complaint Chief Complaint  Patient presents with  . Recurrent Skin Infections    HPI Victoria [name] is a 64 y.o. female comes to the urgent care with complaints of a painful swelling in the buttocks for the past 4 days.  Patient describes pain as constant, moderate severity, throbbing and aggravated by sitting down or palpation.  No known relieving factors.  No discharge.  Patient has had similar episodes in the past.  No fever or chills.   Patient has been trying sitz bath's over the past few days with no improvement in his symptoms.  HPI  Past Medical History:  Diagnosis Date  . Arthritis   . Chronic bronchitis ([place])   . Complication of anesthesia ~ 2011   "they gave me a medicine that swolled me and mouth burning up" (08/23/2012)  . Complication of anesthesia    slow to wake up  . ESRD (end stage renal disease) on dialysis [facility]) Nephrologist-- dr deterding   ESRD due to HTN-; "M/W/F; [place]" (11/28/2015  . GERD (gastroesophageal reflux disease)    no meds, diet controlled  . History of acute pulmonary edema    2003  . Hypertension    no longer on medications  . Left patella fracture   . Pneumonia    as a child  . Seasonal allergies   . Secondary hyperparathyroidism, renal ([place])    s/p  total parathyroidectomy  2014  . Thyroid disease   . Wears glasses   . Wound dehiscence, surgical 11/28/2015    Patient Active Problem List   Diagnosis Date Noted  . Bilateral carpal tunnel syndrome 02/26/2019  . Sensorineural hearing loss (SNHL) of both ears 01/16/2019  . Dehiscence of operative wound 11/28/2015  . Complication associated with orthopedic device ([place]) 10/02/2015  . Mild protein-calorie malnutrition ([place]) 10/04/2013  . Aftercare following surgery of the circulatory system, [place] 03/30/2013  . Mechanical complication of other vascular device, implant, and graft  01/12/2013  . Unspecified disorder of calcium metabolism 10/27/2012  . Other and unspecified hyperlipidemia 10/25/2012  . Chest pain, mid sternal 08/23/2012  . Hypoparathyroidism, unspecified ([place]) 07/10/2012  . Thyroid nodule 03/22/2011  . HTN (hypertension) 03/10/2011  . Kidney disease 03/10/2011  . Secondary hyperparathyroidism of renal origin ([place]) 06/21/2003  . Anemia in chronic kidney disease 06/28/2001  . Iron deficiency anemia, unspecified 06/28/2001  . End stage renal disease ([place]) 09/20/1992    Past Surgical History:  Procedure Laterality Date  . ANKLE FRACTURE SURGERY Right ~ 2005   "has pins in it"  . APPLICATION OF WOUND VAC Left 11/28/2015   knee  . APPLICATION OF WOUND VAC Left 11/28/2015   Procedure: APPLICATION OF WOUND VAC;  Surgeon: Rod Can, MD;  Location: [place];  Service: Orthopedics;  Laterality: Left;  . ARTERIOVENOUS GRAFT PLACEMENT  03-25-2005   Right forearm  w/  multiple Revision's   . CARDIOVASCULAR STRESS TEST  08-24-2012   abnormal nuclear study/  inferolateral and anteroseptal areas of scar,  no ischemia/  normal LV function and wall motion, ef 77%  . DIALYSIS FISTULA CREATION  1992   left upper arm ---  w/  Multiple Revision's until 2006  . ECTOPIC PREGNANCY SURGERY  1970's  . FRACTURE SURGERY    . HARDWARE REMOVAL Right 07/10/2020   Procedure: Removal of deep implant right fibula and tibia; steroid  injection right ankle;  Surgeon: Wylene Simmer, MD;  Location: [place];  Service: Orthopedics;  Laterality: Right;  . I & D EXTREMITY Left 11/28/2015   Procedure: IRRIGATION AND DEBRIDEMENT LEFT KNEE WOUND ;  Surgeon: Rod Can, MD;  Location: [place];  Service: Orthopedics;  Laterality: Left;  . INCISION AND DRAINAGE OF WOUND Left 11/28/2015   knee  . ORIF PATELLA Left 10/31/2015   Procedure: OPEN REDUCTION INTERNAL (ORIF) FIXATION LEFT PATELLA;  Surgeon: Rod Can, MD;  Location: [place];  Service: Orthopedics;  Laterality:  Left;  . PATELLAR TENDON REPAIR  10/03/2012   Procedure: PATELLA TENDON REPAIR;  Surgeon: Marin Shutter, MD;  Location: [place];  Service: Orthopedics;  Laterality: Right;  . PATELLECTOMY  10/03/2012   Procedure: PATELLECTOMY;  Surgeon: Marin Shutter, MD;  Location: [place];  Service: Orthopedics;  Laterality: Right;  RIGHT PARTIAL PATELLECTOMY AND PATELLA TENDON REPAIR  . REVISION OF ARTERIOVENOUS GORETEX GRAFT  09/27/2012   Procedure: REVISION OF ARTERIOVENOUS GORETEX GRAFT;  Surgeon: Mal Misty, MD;  Location: [place];  Service: Vascular;  Laterality: Right;  1) Replacement of venous half of loop with 55mm Gortex graft  2) Excision of erroded pseudoaneurysm of graft with primary closure.  Marland Kitchen REVISION OF ARTERIOVENOUS GORETEX GRAFT Right 01/23/2013   Procedure: REVISION OF ARTERIOVENOUS GORETEX GRAFT;  Surgeon: Conrad [place], MD;  [facility];  Service: Vascular;  Laterality: Right;  Using piece of 57mm x 20cm Gortex graft.   Marland Kitchen REVISION OF ARTERIOVENOUS GORETEX GRAFT Right 10/07/2015   Procedure: REVISION OF Right arm ARTERIOVENOUS GORETEX GRAFT;  Surgeon: Elam Dutch, MD;  Location: [facility] OR;  Service: Vascular;  Laterality: Right;  . REVISION OF ARTERIOVENOUS GORETEX GRAFT Right 02/17/2016   Procedure: REVISION OF ARTERIOVENOUS GORETEX GRAFT;  Surgeon: Elam Dutch, MD;  Location: [place];  Service: Vascular;  Laterality: Right;  . TOTAL ABDOMINAL HYSTERECTOMY  03-05-2005   w/  Right Ovarian Cystectomy  . TOTAL PARATHYROIDECTOMY/  THYROID ISTHMUSECTOMY/  AUTOTRANSPLANTATION PARATHYROID TISSUE TO LEFT BRACHIORIADIALIS MUSCLE  02-18-2011  . TRANSTHORACIC ECHOCARDIOGRAM  10-31-2012   mild LVH,  grade 1 diastolic dysfunction,  ef 55-65%/  mild MR/  trivial TR    OB History   No obstetric history on file.      Home Medications    Prior to Admission medications   Medication Sig Start Date End Date Taking? Authorizing Provider  calcium acetate (PHOSLO) 667 MG capsule Take 2,001 mg by mouth 3  (three) times daily.  05/28/20  Yes [provider]  fluticasone (FLONASE) 50 MCG/ACT nasal spray Place 2 sprays into both nostrils daily as needed.    Yes [provider]  sulfamethoxazole-trimethoprim (BACTRIM DS) 800-160 MG tablet Take 1 tablet by mouth 2 (two) times daily for 5 days. 08/25/20 08/30/20  Chase Picket, MD    Family History Family History  Problem Relation Age of Onset  . Healthy Mother   . Hypertension Father   . Kidney failure Father   . Hypertension Sister   . Thyroid disease Sister     Social History Social History   Tobacco Use  . Smoking status: Never Smoker  . Smokeless tobacco: Never Used  Vaping Use  . Vaping Use: Never used  Substance Use Topics  . Alcohol use: Not Currently    Alcohol/week: 0.0 standard drinks    Comment: quit 2007  . Drug use: No     Allergies   Amlodipine, Cefazolin,  Cephalosporins, and Lisinopril   Review of Systems Review of Systems  Respiratory: Negative.   Genitourinary: Negative.   Skin: Negative for color change, rash and wound.  Neurological: Negative.      Physical Exam Triage Vital Signs ED Triage Vitals  Enc Vitals Group     BP 08/25/20 1033 (!) 151/86     Pulse Rate 08/25/20 1033 91     Resp 08/25/20 1033 16     Temp 08/25/20 1033 98.5 F (36.9 C)     Temp Source 08/25/20 1033 Oral     SpO2 08/25/20 1033 96 %     Weight 08/25/20 1105 167 lb (75.8 kg)     Height 08/25/20 1105 5' 3.5" (1.613 m)     Head Circumference --      Peak Flow --      Pain Score 08/25/20 1104 9     Pain Loc --      Pain Edu? --      Excl. in [place]? --    No data found.  Updated Vital Signs BP (!) 151/86 (BP Location: Left Arm)   Pulse 91   Temp 98.5 F (36.9 C) (Oral)   Resp 16   Ht 5' 3.5" (1.613 m)   Wt 75.8 kg   LMP  (LMP Unknown)   SpO2 96%   BMI 29.12 kg/m   Visual Acuity Right Eye Distance:   Left Eye Distance:   Bilateral Distance:    Right Eye Near:   Left Eye Near:     Bilateral Near:     Physical Exam Vitals and nursing note reviewed.  Constitutional:      General: She is not in acute distress.    Appearance: She is not ill-appearing.  Cardiovascular:     Rate and Rhythm: Normal rate and regular rhythm.     Pulses: Normal pulses.     Heart sounds: Normal heart sounds.  Musculoskeletal:        General: Normal range of motion.     Comments: Tender swelling in the perianal area.  No fluctuance.  No erythema or discharge noted.  Skin:    Capillary Refill: Capillary refill takes less than 2 seconds.  Neurological:     Mental Status: She is alert.      UC Treatments / Results  Labs (all labs ordered are listed, but only abnormal results are displayed) Labs Reviewed - No data to display  EKG   Radiology No results found.  Procedures Procedures (including critical care time)  Medications Ordered in UC Medications - No data to display  Initial Impression / Assessment and Plan / UC Course  I have reviewed the triage vital signs and the nursing notes.  Pertinent labs & imaging results that were available during my care of the patient were reviewed by me and considered in my medical decision making (see chart for details).    1.  Folliculitis in the perianal area: Patient is advised to continue sitz bath's Bactrim double strength 1 tablet twice daily for 5 days If patient notices any worsening pain, swelling or discharge she is advised to return to the urgent care to be reevaluated Ibuprofen as needed for pain. Final Clinical Impressions(s) / UC Diagnoses   Final diagnoses:  Perianal abscess     Discharge Instructions     Please continue sitz bath's Take antibiotics as tolerated NSAIDs/Tylenol for pain If you notice worsening pain, discharge, worsening swelling please return to urgent care to be reevaluated.  ED Prescriptions    Medication Sig Dispense Auth. Provider   sulfamethoxazole-trimethoprim (BACTRIM DS) 800-160 MG  tablet Take 1 tablet by mouth 2 (two) times daily for 5 days. 10 tablet Tonika Eden, Myrene Galas, MD     PDMP not reviewed this encounter.   Chase Picket, MD 08/28/20 [phone]

## 2020-08-27 DIAGNOSIS — D509 Iron deficiency anemia, unspecified: Secondary | ICD-10-CM | POA: Diagnosis not present

## 2020-08-27 DIAGNOSIS — N186 End stage renal disease: Secondary | ICD-10-CM | POA: Diagnosis not present

## 2020-08-27 DIAGNOSIS — N2581 Secondary hyperparathyroidism of renal origin: Secondary | ICD-10-CM | POA: Diagnosis not present

## 2020-08-27 DIAGNOSIS — Z992 Dependence on renal dialysis: Secondary | ICD-10-CM | POA: Diagnosis not present

## 2020-08-29 DIAGNOSIS — N186 End stage renal disease: Secondary | ICD-10-CM | POA: Diagnosis not present

## 2020-08-29 DIAGNOSIS — D509 Iron deficiency anemia, unspecified: Secondary | ICD-10-CM | POA: Diagnosis not present

## 2020-08-29 DIAGNOSIS — N2581 Secondary hyperparathyroidism of renal origin: Secondary | ICD-10-CM | POA: Diagnosis not present

## 2020-08-29 DIAGNOSIS — Z992 Dependence on renal dialysis: Secondary | ICD-10-CM | POA: Diagnosis not present

## 2020-09-01 DIAGNOSIS — Z992 Dependence on renal dialysis: Secondary | ICD-10-CM | POA: Diagnosis not present

## 2020-09-01 DIAGNOSIS — D509 Iron deficiency anemia, unspecified: Secondary | ICD-10-CM | POA: Diagnosis not present

## 2020-09-01 DIAGNOSIS — N186 End stage renal disease: Secondary | ICD-10-CM | POA: Diagnosis not present

## 2020-09-01 DIAGNOSIS — N2581 Secondary hyperparathyroidism of renal origin: Secondary | ICD-10-CM | POA: Diagnosis not present

## 2020-09-03 DIAGNOSIS — N186 End stage renal disease: Secondary | ICD-10-CM | POA: Diagnosis not present

## 2020-09-03 DIAGNOSIS — N2581 Secondary hyperparathyroidism of renal origin: Secondary | ICD-10-CM | POA: Diagnosis not present

## 2020-09-03 DIAGNOSIS — Z992 Dependence on renal dialysis: Secondary | ICD-10-CM | POA: Diagnosis not present

## 2020-09-03 DIAGNOSIS — D509 Iron deficiency anemia, unspecified: Secondary | ICD-10-CM | POA: Diagnosis not present

## 2020-09-05 DIAGNOSIS — D509 Iron deficiency anemia, unspecified: Secondary | ICD-10-CM | POA: Diagnosis not present

## 2020-09-05 DIAGNOSIS — Z992 Dependence on renal dialysis: Secondary | ICD-10-CM | POA: Diagnosis not present

## 2020-09-05 DIAGNOSIS — N2581 Secondary hyperparathyroidism of renal origin: Secondary | ICD-10-CM | POA: Diagnosis not present

## 2020-09-05 DIAGNOSIS — N186 End stage renal disease: Secondary | ICD-10-CM | POA: Diagnosis not present

## 2020-09-08 DIAGNOSIS — N2581 Secondary hyperparathyroidism of renal origin: Secondary | ICD-10-CM | POA: Diagnosis not present

## 2020-09-08 DIAGNOSIS — N186 End stage renal disease: Secondary | ICD-10-CM | POA: Diagnosis not present

## 2020-09-08 DIAGNOSIS — D509 Iron deficiency anemia, unspecified: Secondary | ICD-10-CM | POA: Diagnosis not present

## 2020-09-08 DIAGNOSIS — Z992 Dependence on renal dialysis: Secondary | ICD-10-CM | POA: Diagnosis not present

## 2020-09-10 DIAGNOSIS — Z992 Dependence on renal dialysis: Secondary | ICD-10-CM | POA: Diagnosis not present

## 2020-09-10 DIAGNOSIS — N2581 Secondary hyperparathyroidism of renal origin: Secondary | ICD-10-CM | POA: Diagnosis not present

## 2020-09-10 DIAGNOSIS — N186 End stage renal disease: Secondary | ICD-10-CM | POA: Diagnosis not present

## 2020-09-10 DIAGNOSIS — D509 Iron deficiency anemia, unspecified: Secondary | ICD-10-CM | POA: Diagnosis not present

## 2020-09-12 DIAGNOSIS — N186 End stage renal disease: Secondary | ICD-10-CM | POA: Diagnosis not present

## 2020-09-12 DIAGNOSIS — D509 Iron deficiency anemia, unspecified: Secondary | ICD-10-CM | POA: Diagnosis not present

## 2020-09-12 DIAGNOSIS — N2581 Secondary hyperparathyroidism of renal origin: Secondary | ICD-10-CM | POA: Diagnosis not present

## 2020-09-12 DIAGNOSIS — Z992 Dependence on renal dialysis: Secondary | ICD-10-CM | POA: Diagnosis not present

## 2020-09-15 DIAGNOSIS — D509 Iron deficiency anemia, unspecified: Secondary | ICD-10-CM | POA: Diagnosis not present

## 2020-09-15 DIAGNOSIS — Z992 Dependence on renal dialysis: Secondary | ICD-10-CM | POA: Diagnosis not present

## 2020-09-15 DIAGNOSIS — M1712 Unilateral primary osteoarthritis, left knee: Secondary | ICD-10-CM | POA: Diagnosis not present

## 2020-09-15 DIAGNOSIS — M170 Bilateral primary osteoarthritis of knee: Secondary | ICD-10-CM | POA: Diagnosis not present

## 2020-09-15 DIAGNOSIS — N186 End stage renal disease: Secondary | ICD-10-CM | POA: Diagnosis not present

## 2020-09-15 DIAGNOSIS — N2581 Secondary hyperparathyroidism of renal origin: Secondary | ICD-10-CM | POA: Diagnosis not present

## 2020-09-17 ENCOUNTER — Telehealth: Payer: Self-pay | Admitting: *Deleted

## 2020-09-17 DIAGNOSIS — N2581 Secondary hyperparathyroidism of renal origin: Secondary | ICD-10-CM | POA: Diagnosis not present

## 2020-09-17 DIAGNOSIS — D509 Iron deficiency anemia, unspecified: Secondary | ICD-10-CM | POA: Diagnosis not present

## 2020-09-17 DIAGNOSIS — N186 End stage renal disease: Secondary | ICD-10-CM | POA: Diagnosis not present

## 2020-09-17 DIAGNOSIS — Z992 Dependence on renal dialysis: Secondary | ICD-10-CM | POA: Diagnosis not present

## 2020-09-17 NOTE — Telephone Encounter (Signed)
Patient no showed PV today at 1030 am - Called patient x 3 with no answer  and unable to  left message to return call by 5 pm today- there is no voice mail set up on phone , PV and procedure will be canceled -   PV and Procedure both canceled- No Show letter mailed to patient

## 2020-09-18 DIAGNOSIS — M25562 Pain in left knee: Secondary | ICD-10-CM | POA: Diagnosis not present

## 2020-09-19 DIAGNOSIS — N2581 Secondary hyperparathyroidism of renal origin: Secondary | ICD-10-CM | POA: Diagnosis not present

## 2020-09-19 DIAGNOSIS — Z992 Dependence on renal dialysis: Secondary | ICD-10-CM | POA: Diagnosis not present

## 2020-09-19 DIAGNOSIS — D509 Iron deficiency anemia, unspecified: Secondary | ICD-10-CM | POA: Diagnosis not present

## 2020-09-19 DIAGNOSIS — N186 End stage renal disease: Secondary | ICD-10-CM | POA: Diagnosis not present

## 2020-09-20 DIAGNOSIS — N186 End stage renal disease: Secondary | ICD-10-CM | POA: Diagnosis not present

## 2020-09-20 DIAGNOSIS — Z992 Dependence on renal dialysis: Secondary | ICD-10-CM | POA: Diagnosis not present

## 2020-09-20 DIAGNOSIS — I120 Hypertensive chronic kidney disease with stage 5 chronic kidney disease or end stage renal disease: Secondary | ICD-10-CM | POA: Diagnosis not present

## 2020-09-22 DIAGNOSIS — Z992 Dependence on renal dialysis: Secondary | ICD-10-CM | POA: Diagnosis not present

## 2020-09-22 DIAGNOSIS — N2581 Secondary hyperparathyroidism of renal origin: Secondary | ICD-10-CM | POA: Diagnosis not present

## 2020-09-22 DIAGNOSIS — N186 End stage renal disease: Secondary | ICD-10-CM | POA: Diagnosis not present

## 2020-09-22 DIAGNOSIS — M1711 Unilateral primary osteoarthritis, right knee: Secondary | ICD-10-CM | POA: Diagnosis not present

## 2020-09-22 DIAGNOSIS — M170 Bilateral primary osteoarthritis of knee: Secondary | ICD-10-CM | POA: Diagnosis not present

## 2020-09-23 DIAGNOSIS — H18413 Arcus senilis, bilateral: Secondary | ICD-10-CM | POA: Diagnosis not present

## 2020-09-23 DIAGNOSIS — H25013 Cortical age-related cataract, bilateral: Secondary | ICD-10-CM | POA: Diagnosis not present

## 2020-09-23 DIAGNOSIS — H2511 Age-related nuclear cataract, right eye: Secondary | ICD-10-CM | POA: Diagnosis not present

## 2020-09-23 DIAGNOSIS — H2513 Age-related nuclear cataract, bilateral: Secondary | ICD-10-CM | POA: Diagnosis not present

## 2020-09-23 DIAGNOSIS — H25043 Posterior subcapsular polar age-related cataract, bilateral: Secondary | ICD-10-CM | POA: Diagnosis not present

## 2020-09-24 DIAGNOSIS — N2581 Secondary hyperparathyroidism of renal origin: Secondary | ICD-10-CM | POA: Diagnosis not present

## 2020-09-24 DIAGNOSIS — Z992 Dependence on renal dialysis: Secondary | ICD-10-CM | POA: Diagnosis not present

## 2020-09-24 DIAGNOSIS — N186 End stage renal disease: Secondary | ICD-10-CM | POA: Diagnosis not present

## 2020-09-26 DIAGNOSIS — N2581 Secondary hyperparathyroidism of renal origin: Secondary | ICD-10-CM | POA: Diagnosis not present

## 2020-09-26 DIAGNOSIS — N186 End stage renal disease: Secondary | ICD-10-CM | POA: Diagnosis not present

## 2020-09-26 DIAGNOSIS — Z992 Dependence on renal dialysis: Secondary | ICD-10-CM | POA: Diagnosis not present

## 2020-09-29 DIAGNOSIS — N186 End stage renal disease: Secondary | ICD-10-CM | POA: Diagnosis not present

## 2020-09-29 DIAGNOSIS — Z992 Dependence on renal dialysis: Secondary | ICD-10-CM | POA: Diagnosis not present

## 2020-09-29 DIAGNOSIS — N2581 Secondary hyperparathyroidism of renal origin: Secondary | ICD-10-CM | POA: Diagnosis not present

## 2020-10-01 ENCOUNTER — Encounter: Payer: Medicare Other | Admitting: Gastroenterology

## 2020-10-01 DIAGNOSIS — H2511 Age-related nuclear cataract, right eye: Secondary | ICD-10-CM | POA: Diagnosis not present

## 2020-10-01 DIAGNOSIS — H2589 Other age-related cataract: Secondary | ICD-10-CM | POA: Diagnosis not present

## 2020-10-01 DIAGNOSIS — H5703 Miosis: Secondary | ICD-10-CM | POA: Diagnosis not present

## 2020-10-02 ENCOUNTER — Other Ambulatory Visit: Payer: Medicare Other

## 2020-10-02 DIAGNOSIS — H2512 Age-related nuclear cataract, left eye: Secondary | ICD-10-CM | POA: Diagnosis not present

## 2020-10-03 DIAGNOSIS — N186 End stage renal disease: Secondary | ICD-10-CM | POA: Diagnosis not present

## 2020-10-03 DIAGNOSIS — Z992 Dependence on renal dialysis: Secondary | ICD-10-CM | POA: Diagnosis not present

## 2020-10-03 DIAGNOSIS — N2581 Secondary hyperparathyroidism of renal origin: Secondary | ICD-10-CM | POA: Diagnosis not present

## 2020-10-06 DIAGNOSIS — N2581 Secondary hyperparathyroidism of renal origin: Secondary | ICD-10-CM | POA: Diagnosis not present

## 2020-10-06 DIAGNOSIS — Z992 Dependence on renal dialysis: Secondary | ICD-10-CM | POA: Diagnosis not present

## 2020-10-06 DIAGNOSIS — N186 End stage renal disease: Secondary | ICD-10-CM | POA: Diagnosis not present

## 2020-10-08 DIAGNOSIS — Z992 Dependence on renal dialysis: Secondary | ICD-10-CM | POA: Diagnosis not present

## 2020-10-08 DIAGNOSIS — N186 End stage renal disease: Secondary | ICD-10-CM | POA: Diagnosis not present

## 2020-10-08 DIAGNOSIS — N2581 Secondary hyperparathyroidism of renal origin: Secondary | ICD-10-CM | POA: Diagnosis not present

## 2020-10-10 DIAGNOSIS — N186 End stage renal disease: Secondary | ICD-10-CM | POA: Diagnosis not present

## 2020-10-10 DIAGNOSIS — N2581 Secondary hyperparathyroidism of renal origin: Secondary | ICD-10-CM | POA: Diagnosis not present

## 2020-10-10 DIAGNOSIS — Z992 Dependence on renal dialysis: Secondary | ICD-10-CM | POA: Diagnosis not present

## 2020-10-13 DIAGNOSIS — N186 End stage renal disease: Secondary | ICD-10-CM | POA: Diagnosis not present

## 2020-10-13 DIAGNOSIS — N2581 Secondary hyperparathyroidism of renal origin: Secondary | ICD-10-CM | POA: Diagnosis not present

## 2020-10-13 DIAGNOSIS — Z992 Dependence on renal dialysis: Secondary | ICD-10-CM | POA: Diagnosis not present

## 2020-10-15 DIAGNOSIS — N186 End stage renal disease: Secondary | ICD-10-CM | POA: Diagnosis not present

## 2020-10-15 DIAGNOSIS — N2581 Secondary hyperparathyroidism of renal origin: Secondary | ICD-10-CM | POA: Diagnosis not present

## 2020-10-15 DIAGNOSIS — Z992 Dependence on renal dialysis: Secondary | ICD-10-CM | POA: Diagnosis not present

## 2020-10-17 DIAGNOSIS — N186 End stage renal disease: Secondary | ICD-10-CM | POA: Diagnosis not present

## 2020-10-17 DIAGNOSIS — N2581 Secondary hyperparathyroidism of renal origin: Secondary | ICD-10-CM | POA: Diagnosis not present

## 2020-10-17 DIAGNOSIS — Z992 Dependence on renal dialysis: Secondary | ICD-10-CM | POA: Diagnosis not present

## 2020-10-20 DIAGNOSIS — Z992 Dependence on renal dialysis: Secondary | ICD-10-CM | POA: Diagnosis not present

## 2020-10-20 DIAGNOSIS — N2581 Secondary hyperparathyroidism of renal origin: Secondary | ICD-10-CM | POA: Diagnosis not present

## 2020-10-20 DIAGNOSIS — N186 End stage renal disease: Secondary | ICD-10-CM | POA: Diagnosis not present

## 2020-10-21 DIAGNOSIS — Z992 Dependence on renal dialysis: Secondary | ICD-10-CM | POA: Diagnosis not present

## 2020-10-21 DIAGNOSIS — I120 Hypertensive chronic kidney disease with stage 5 chronic kidney disease or end stage renal disease: Secondary | ICD-10-CM | POA: Diagnosis not present

## 2020-10-21 DIAGNOSIS — N186 End stage renal disease: Secondary | ICD-10-CM | POA: Diagnosis not present

## 2020-10-22 DIAGNOSIS — N186 End stage renal disease: Secondary | ICD-10-CM | POA: Diagnosis not present

## 2020-10-22 DIAGNOSIS — Z992 Dependence on renal dialysis: Secondary | ICD-10-CM | POA: Diagnosis not present

## 2020-10-22 DIAGNOSIS — N2581 Secondary hyperparathyroidism of renal origin: Secondary | ICD-10-CM | POA: Diagnosis not present

## 2020-10-24 DIAGNOSIS — N186 End stage renal disease: Secondary | ICD-10-CM | POA: Diagnosis not present

## 2020-10-24 DIAGNOSIS — N2581 Secondary hyperparathyroidism of renal origin: Secondary | ICD-10-CM | POA: Diagnosis not present

## 2020-10-24 DIAGNOSIS — Z992 Dependence on renal dialysis: Secondary | ICD-10-CM | POA: Diagnosis not present

## 2020-10-27 DIAGNOSIS — Z992 Dependence on renal dialysis: Secondary | ICD-10-CM | POA: Diagnosis not present

## 2020-10-27 DIAGNOSIS — N2581 Secondary hyperparathyroidism of renal origin: Secondary | ICD-10-CM | POA: Diagnosis not present

## 2020-10-27 DIAGNOSIS — N186 End stage renal disease: Secondary | ICD-10-CM | POA: Diagnosis not present

## 2020-10-29 DIAGNOSIS — H2512 Age-related nuclear cataract, left eye: Secondary | ICD-10-CM | POA: Diagnosis not present

## 2020-10-31 DIAGNOSIS — N186 End stage renal disease: Secondary | ICD-10-CM | POA: Diagnosis not present

## 2020-10-31 DIAGNOSIS — Z992 Dependence on renal dialysis: Secondary | ICD-10-CM | POA: Diagnosis not present

## 2020-10-31 DIAGNOSIS — N2581 Secondary hyperparathyroidism of renal origin: Secondary | ICD-10-CM | POA: Diagnosis not present

## 2020-11-03 DIAGNOSIS — Z992 Dependence on renal dialysis: Secondary | ICD-10-CM | POA: Diagnosis not present

## 2020-11-03 DIAGNOSIS — N2581 Secondary hyperparathyroidism of renal origin: Secondary | ICD-10-CM | POA: Diagnosis not present

## 2020-11-03 DIAGNOSIS — N186 End stage renal disease: Secondary | ICD-10-CM | POA: Diagnosis not present

## 2020-11-05 DIAGNOSIS — N186 End stage renal disease: Secondary | ICD-10-CM | POA: Diagnosis not present

## 2020-11-05 DIAGNOSIS — Z992 Dependence on renal dialysis: Secondary | ICD-10-CM | POA: Diagnosis not present

## 2020-11-05 DIAGNOSIS — N2581 Secondary hyperparathyroidism of renal origin: Secondary | ICD-10-CM | POA: Diagnosis not present

## 2020-11-06 DIAGNOSIS — M25562 Pain in left knee: Secondary | ICD-10-CM | POA: Diagnosis not present

## 2020-11-07 DIAGNOSIS — N186 End stage renal disease: Secondary | ICD-10-CM | POA: Diagnosis not present

## 2020-11-07 DIAGNOSIS — N2581 Secondary hyperparathyroidism of renal origin: Secondary | ICD-10-CM | POA: Diagnosis not present

## 2020-11-07 DIAGNOSIS — Z992 Dependence on renal dialysis: Secondary | ICD-10-CM | POA: Diagnosis not present

## 2020-11-10 DIAGNOSIS — Z992 Dependence on renal dialysis: Secondary | ICD-10-CM | POA: Diagnosis not present

## 2020-11-10 DIAGNOSIS — N186 End stage renal disease: Secondary | ICD-10-CM | POA: Diagnosis not present

## 2020-11-10 DIAGNOSIS — N2581 Secondary hyperparathyroidism of renal origin: Secondary | ICD-10-CM | POA: Diagnosis not present

## 2020-11-11 ENCOUNTER — Encounter: Payer: Self-pay | Admitting: Physician Assistant

## 2020-11-12 DIAGNOSIS — N186 End stage renal disease: Secondary | ICD-10-CM | POA: Diagnosis not present

## 2020-11-12 DIAGNOSIS — Z992 Dependence on renal dialysis: Secondary | ICD-10-CM | POA: Diagnosis not present

## 2020-11-12 DIAGNOSIS — N2581 Secondary hyperparathyroidism of renal origin: Secondary | ICD-10-CM | POA: Diagnosis not present

## 2020-11-14 DIAGNOSIS — N2581 Secondary hyperparathyroidism of renal origin: Secondary | ICD-10-CM | POA: Diagnosis not present

## 2020-11-14 DIAGNOSIS — Z992 Dependence on renal dialysis: Secondary | ICD-10-CM | POA: Diagnosis not present

## 2020-11-14 DIAGNOSIS — N186 End stage renal disease: Secondary | ICD-10-CM | POA: Diagnosis not present

## 2020-11-14 DIAGNOSIS — S8002XA Contusion of left knee, initial encounter: Secondary | ICD-10-CM | POA: Diagnosis not present

## 2020-11-17 DIAGNOSIS — N2581 Secondary hyperparathyroidism of renal origin: Secondary | ICD-10-CM | POA: Diagnosis not present

## 2020-11-17 DIAGNOSIS — Z992 Dependence on renal dialysis: Secondary | ICD-10-CM | POA: Diagnosis not present

## 2020-11-17 DIAGNOSIS — N186 End stage renal disease: Secondary | ICD-10-CM | POA: Diagnosis not present

## 2020-11-18 DIAGNOSIS — I120 Hypertensive chronic kidney disease with stage 5 chronic kidney disease or end stage renal disease: Secondary | ICD-10-CM | POA: Diagnosis not present

## 2020-11-18 DIAGNOSIS — N186 End stage renal disease: Secondary | ICD-10-CM | POA: Diagnosis not present

## 2020-11-18 DIAGNOSIS — Z992 Dependence on renal dialysis: Secondary | ICD-10-CM | POA: Diagnosis not present

## 2020-11-19 DIAGNOSIS — N2581 Secondary hyperparathyroidism of renal origin: Secondary | ICD-10-CM | POA: Diagnosis not present

## 2020-11-19 DIAGNOSIS — N186 End stage renal disease: Secondary | ICD-10-CM | POA: Diagnosis not present

## 2020-11-19 DIAGNOSIS — Z992 Dependence on renal dialysis: Secondary | ICD-10-CM | POA: Diagnosis not present

## 2020-11-21 DIAGNOSIS — N2581 Secondary hyperparathyroidism of renal origin: Secondary | ICD-10-CM | POA: Diagnosis not present

## 2020-11-21 DIAGNOSIS — Z992 Dependence on renal dialysis: Secondary | ICD-10-CM | POA: Diagnosis not present

## 2020-11-21 DIAGNOSIS — N186 End stage renal disease: Secondary | ICD-10-CM | POA: Diagnosis not present

## 2020-11-24 DIAGNOSIS — N2581 Secondary hyperparathyroidism of renal origin: Secondary | ICD-10-CM | POA: Diagnosis not present

## 2020-11-24 DIAGNOSIS — Z992 Dependence on renal dialysis: Secondary | ICD-10-CM | POA: Diagnosis not present

## 2020-11-24 DIAGNOSIS — N186 End stage renal disease: Secondary | ICD-10-CM | POA: Diagnosis not present

## 2020-11-26 ENCOUNTER — Ambulatory Visit: Payer: Medicare Other | Admitting: Physician Assistant

## 2020-11-26 DIAGNOSIS — N2581 Secondary hyperparathyroidism of renal origin: Secondary | ICD-10-CM | POA: Diagnosis not present

## 2020-11-26 DIAGNOSIS — N186 End stage renal disease: Secondary | ICD-10-CM | POA: Diagnosis not present

## 2020-11-26 DIAGNOSIS — Z992 Dependence on renal dialysis: Secondary | ICD-10-CM | POA: Diagnosis not present

## 2020-11-27 DIAGNOSIS — H35353 Cystoid macular degeneration, bilateral: Secondary | ICD-10-CM | POA: Diagnosis not present

## 2020-11-28 DIAGNOSIS — N2581 Secondary hyperparathyroidism of renal origin: Secondary | ICD-10-CM | POA: Diagnosis not present

## 2020-11-28 DIAGNOSIS — Z992 Dependence on renal dialysis: Secondary | ICD-10-CM | POA: Diagnosis not present

## 2020-11-28 DIAGNOSIS — N186 End stage renal disease: Secondary | ICD-10-CM | POA: Diagnosis not present

## 2020-12-01 DIAGNOSIS — Z992 Dependence on renal dialysis: Secondary | ICD-10-CM | POA: Diagnosis not present

## 2020-12-01 DIAGNOSIS — N186 End stage renal disease: Secondary | ICD-10-CM | POA: Diagnosis not present

## 2020-12-01 DIAGNOSIS — N2581 Secondary hyperparathyroidism of renal origin: Secondary | ICD-10-CM | POA: Diagnosis not present

## 2020-12-03 DIAGNOSIS — N186 End stage renal disease: Secondary | ICD-10-CM | POA: Diagnosis not present

## 2020-12-03 DIAGNOSIS — Z992 Dependence on renal dialysis: Secondary | ICD-10-CM | POA: Diagnosis not present

## 2020-12-03 DIAGNOSIS — N2581 Secondary hyperparathyroidism of renal origin: Secondary | ICD-10-CM | POA: Diagnosis not present

## 2020-12-05 DIAGNOSIS — Z992 Dependence on renal dialysis: Secondary | ICD-10-CM | POA: Diagnosis not present

## 2020-12-05 DIAGNOSIS — N2581 Secondary hyperparathyroidism of renal origin: Secondary | ICD-10-CM | POA: Diagnosis not present

## 2020-12-05 DIAGNOSIS — N186 End stage renal disease: Secondary | ICD-10-CM | POA: Diagnosis not present

## 2020-12-08 DIAGNOSIS — N186 End stage renal disease: Secondary | ICD-10-CM | POA: Diagnosis not present

## 2020-12-08 DIAGNOSIS — Z992 Dependence on renal dialysis: Secondary | ICD-10-CM | POA: Diagnosis not present

## 2020-12-08 DIAGNOSIS — N2581 Secondary hyperparathyroidism of renal origin: Secondary | ICD-10-CM | POA: Diagnosis not present

## 2020-12-10 DIAGNOSIS — Z992 Dependence on renal dialysis: Secondary | ICD-10-CM | POA: Diagnosis not present

## 2020-12-10 DIAGNOSIS — N2581 Secondary hyperparathyroidism of renal origin: Secondary | ICD-10-CM | POA: Diagnosis not present

## 2020-12-10 DIAGNOSIS — N186 End stage renal disease: Secondary | ICD-10-CM | POA: Diagnosis not present

## 2020-12-12 DIAGNOSIS — N2581 Secondary hyperparathyroidism of renal origin: Secondary | ICD-10-CM | POA: Diagnosis not present

## 2020-12-12 DIAGNOSIS — Z992 Dependence on renal dialysis: Secondary | ICD-10-CM | POA: Diagnosis not present

## 2020-12-12 DIAGNOSIS — N186 End stage renal disease: Secondary | ICD-10-CM | POA: Diagnosis not present

## 2020-12-15 DIAGNOSIS — N186 End stage renal disease: Secondary | ICD-10-CM | POA: Diagnosis not present

## 2020-12-15 DIAGNOSIS — N2581 Secondary hyperparathyroidism of renal origin: Secondary | ICD-10-CM | POA: Diagnosis not present

## 2020-12-15 DIAGNOSIS — Z992 Dependence on renal dialysis: Secondary | ICD-10-CM | POA: Diagnosis not present

## 2020-12-17 DIAGNOSIS — Z992 Dependence on renal dialysis: Secondary | ICD-10-CM | POA: Diagnosis not present

## 2020-12-17 DIAGNOSIS — N2581 Secondary hyperparathyroidism of renal origin: Secondary | ICD-10-CM | POA: Diagnosis not present

## 2020-12-17 DIAGNOSIS — N186 End stage renal disease: Secondary | ICD-10-CM | POA: Diagnosis not present

## 2020-12-19 DIAGNOSIS — N186 End stage renal disease: Secondary | ICD-10-CM | POA: Diagnosis not present

## 2020-12-19 DIAGNOSIS — I120 Hypertensive chronic kidney disease with stage 5 chronic kidney disease or end stage renal disease: Secondary | ICD-10-CM | POA: Diagnosis not present

## 2020-12-19 DIAGNOSIS — N2581 Secondary hyperparathyroidism of renal origin: Secondary | ICD-10-CM | POA: Diagnosis not present

## 2020-12-19 DIAGNOSIS — Z992 Dependence on renal dialysis: Secondary | ICD-10-CM | POA: Diagnosis not present

## 2020-12-22 DIAGNOSIS — N2581 Secondary hyperparathyroidism of renal origin: Secondary | ICD-10-CM | POA: Diagnosis not present

## 2020-12-22 DIAGNOSIS — N186 End stage renal disease: Secondary | ICD-10-CM | POA: Diagnosis not present

## 2020-12-22 DIAGNOSIS — Z992 Dependence on renal dialysis: Secondary | ICD-10-CM | POA: Diagnosis not present

## 2020-12-24 DIAGNOSIS — N2581 Secondary hyperparathyroidism of renal origin: Secondary | ICD-10-CM | POA: Diagnosis not present

## 2020-12-24 DIAGNOSIS — N186 End stage renal disease: Secondary | ICD-10-CM | POA: Diagnosis not present

## 2020-12-24 DIAGNOSIS — Z992 Dependence on renal dialysis: Secondary | ICD-10-CM | POA: Diagnosis not present

## 2020-12-26 DIAGNOSIS — N186 End stage renal disease: Secondary | ICD-10-CM | POA: Diagnosis not present

## 2020-12-26 DIAGNOSIS — Z992 Dependence on renal dialysis: Secondary | ICD-10-CM | POA: Diagnosis not present

## 2020-12-26 DIAGNOSIS — N2581 Secondary hyperparathyroidism of renal origin: Secondary | ICD-10-CM | POA: Diagnosis not present

## 2020-12-29 DIAGNOSIS — Z992 Dependence on renal dialysis: Secondary | ICD-10-CM | POA: Diagnosis not present

## 2020-12-29 DIAGNOSIS — N2581 Secondary hyperparathyroidism of renal origin: Secondary | ICD-10-CM | POA: Diagnosis not present

## 2020-12-29 DIAGNOSIS — N186 End stage renal disease: Secondary | ICD-10-CM | POA: Diagnosis not present

## 2020-12-31 DIAGNOSIS — N2581 Secondary hyperparathyroidism of renal origin: Secondary | ICD-10-CM | POA: Diagnosis not present

## 2020-12-31 DIAGNOSIS — Z992 Dependence on renal dialysis: Secondary | ICD-10-CM | POA: Diagnosis not present

## 2020-12-31 DIAGNOSIS — N186 End stage renal disease: Secondary | ICD-10-CM | POA: Diagnosis not present

## 2021-01-02 DIAGNOSIS — Z992 Dependence on renal dialysis: Secondary | ICD-10-CM | POA: Diagnosis not present

## 2021-01-02 DIAGNOSIS — N2581 Secondary hyperparathyroidism of renal origin: Secondary | ICD-10-CM | POA: Diagnosis not present

## 2021-01-02 DIAGNOSIS — N186 End stage renal disease: Secondary | ICD-10-CM | POA: Diagnosis not present

## 2021-01-05 DIAGNOSIS — Z992 Dependence on renal dialysis: Secondary | ICD-10-CM | POA: Diagnosis not present

## 2021-01-05 DIAGNOSIS — N186 End stage renal disease: Secondary | ICD-10-CM | POA: Diagnosis not present

## 2021-01-05 DIAGNOSIS — N2581 Secondary hyperparathyroidism of renal origin: Secondary | ICD-10-CM | POA: Diagnosis not present

## 2021-01-07 DIAGNOSIS — N2581 Secondary hyperparathyroidism of renal origin: Secondary | ICD-10-CM | POA: Diagnosis not present

## 2021-01-07 DIAGNOSIS — Z992 Dependence on renal dialysis: Secondary | ICD-10-CM | POA: Diagnosis not present

## 2021-01-07 DIAGNOSIS — N186 End stage renal disease: Secondary | ICD-10-CM | POA: Diagnosis not present

## 2021-01-09 DIAGNOSIS — N186 End stage renal disease: Secondary | ICD-10-CM | POA: Diagnosis not present

## 2021-01-09 DIAGNOSIS — Z992 Dependence on renal dialysis: Secondary | ICD-10-CM | POA: Diagnosis not present

## 2021-01-09 DIAGNOSIS — N2581 Secondary hyperparathyroidism of renal origin: Secondary | ICD-10-CM | POA: Diagnosis not present

## 2021-01-12 DIAGNOSIS — Z992 Dependence on renal dialysis: Secondary | ICD-10-CM | POA: Diagnosis not present

## 2021-01-12 DIAGNOSIS — N2581 Secondary hyperparathyroidism of renal origin: Secondary | ICD-10-CM | POA: Diagnosis not present

## 2021-01-12 DIAGNOSIS — N186 End stage renal disease: Secondary | ICD-10-CM | POA: Diagnosis not present

## 2021-01-13 DIAGNOSIS — T82858A Stenosis of vascular prosthetic devices, implants and grafts, initial encounter: Secondary | ICD-10-CM | POA: Diagnosis not present

## 2021-01-13 DIAGNOSIS — N186 End stage renal disease: Secondary | ICD-10-CM | POA: Diagnosis not present

## 2021-01-13 DIAGNOSIS — Z992 Dependence on renal dialysis: Secondary | ICD-10-CM | POA: Diagnosis not present

## 2021-01-13 DIAGNOSIS — I871 Compression of vein: Secondary | ICD-10-CM | POA: Diagnosis not present

## 2021-01-14 ENCOUNTER — Ambulatory Visit: Payer: Medicare Other | Admitting: Nurse Practitioner

## 2021-01-14 DIAGNOSIS — N186 End stage renal disease: Secondary | ICD-10-CM | POA: Diagnosis not present

## 2021-01-14 DIAGNOSIS — Z992 Dependence on renal dialysis: Secondary | ICD-10-CM | POA: Diagnosis not present

## 2021-01-14 DIAGNOSIS — N2581 Secondary hyperparathyroidism of renal origin: Secondary | ICD-10-CM | POA: Diagnosis not present

## 2021-01-16 DIAGNOSIS — N2581 Secondary hyperparathyroidism of renal origin: Secondary | ICD-10-CM | POA: Diagnosis not present

## 2021-01-16 DIAGNOSIS — N186 End stage renal disease: Secondary | ICD-10-CM | POA: Diagnosis not present

## 2021-01-16 DIAGNOSIS — Z992 Dependence on renal dialysis: Secondary | ICD-10-CM | POA: Diagnosis not present

## 2021-01-18 DIAGNOSIS — N186 End stage renal disease: Secondary | ICD-10-CM | POA: Diagnosis not present

## 2021-01-18 DIAGNOSIS — I120 Hypertensive chronic kidney disease with stage 5 chronic kidney disease or end stage renal disease: Secondary | ICD-10-CM | POA: Diagnosis not present

## 2021-01-18 DIAGNOSIS — Z992 Dependence on renal dialysis: Secondary | ICD-10-CM | POA: Diagnosis not present

## 2021-01-19 DIAGNOSIS — Z992 Dependence on renal dialysis: Secondary | ICD-10-CM | POA: Diagnosis not present

## 2021-01-19 DIAGNOSIS — N186 End stage renal disease: Secondary | ICD-10-CM | POA: Diagnosis not present

## 2021-01-19 DIAGNOSIS — N2581 Secondary hyperparathyroidism of renal origin: Secondary | ICD-10-CM | POA: Diagnosis not present

## 2021-01-20 DIAGNOSIS — H35353 Cystoid macular degeneration, bilateral: Secondary | ICD-10-CM | POA: Diagnosis not present

## 2021-01-21 DIAGNOSIS — N186 End stage renal disease: Secondary | ICD-10-CM | POA: Diagnosis not present

## 2021-01-21 DIAGNOSIS — Z992 Dependence on renal dialysis: Secondary | ICD-10-CM | POA: Diagnosis not present

## 2021-01-21 DIAGNOSIS — N2581 Secondary hyperparathyroidism of renal origin: Secondary | ICD-10-CM | POA: Diagnosis not present

## 2021-01-23 DIAGNOSIS — Z992 Dependence on renal dialysis: Secondary | ICD-10-CM | POA: Diagnosis not present

## 2021-01-23 DIAGNOSIS — N2581 Secondary hyperparathyroidism of renal origin: Secondary | ICD-10-CM | POA: Diagnosis not present

## 2021-01-23 DIAGNOSIS — N186 End stage renal disease: Secondary | ICD-10-CM | POA: Diagnosis not present

## 2021-01-26 DIAGNOSIS — Z992 Dependence on renal dialysis: Secondary | ICD-10-CM | POA: Diagnosis not present

## 2021-01-26 DIAGNOSIS — N2581 Secondary hyperparathyroidism of renal origin: Secondary | ICD-10-CM | POA: Diagnosis not present

## 2021-01-26 DIAGNOSIS — N186 End stage renal disease: Secondary | ICD-10-CM | POA: Diagnosis not present

## 2021-01-28 DIAGNOSIS — N186 End stage renal disease: Secondary | ICD-10-CM | POA: Diagnosis not present

## 2021-01-28 DIAGNOSIS — N2581 Secondary hyperparathyroidism of renal origin: Secondary | ICD-10-CM | POA: Diagnosis not present

## 2021-01-28 DIAGNOSIS — Z992 Dependence on renal dialysis: Secondary | ICD-10-CM | POA: Diagnosis not present

## 2021-01-30 DIAGNOSIS — N2581 Secondary hyperparathyroidism of renal origin: Secondary | ICD-10-CM | POA: Diagnosis not present

## 2021-01-30 DIAGNOSIS — N186 End stage renal disease: Secondary | ICD-10-CM | POA: Diagnosis not present

## 2021-01-30 DIAGNOSIS — Z992 Dependence on renal dialysis: Secondary | ICD-10-CM | POA: Diagnosis not present

## 2021-02-02 DIAGNOSIS — N186 End stage renal disease: Secondary | ICD-10-CM | POA: Diagnosis not present

## 2021-02-02 DIAGNOSIS — Z992 Dependence on renal dialysis: Secondary | ICD-10-CM | POA: Diagnosis not present

## 2021-02-02 DIAGNOSIS — N2581 Secondary hyperparathyroidism of renal origin: Secondary | ICD-10-CM | POA: Diagnosis not present

## 2021-02-04 DIAGNOSIS — N186 End stage renal disease: Secondary | ICD-10-CM | POA: Diagnosis not present

## 2021-02-04 DIAGNOSIS — N2581 Secondary hyperparathyroidism of renal origin: Secondary | ICD-10-CM | POA: Diagnosis not present

## 2021-02-04 DIAGNOSIS — Z992 Dependence on renal dialysis: Secondary | ICD-10-CM | POA: Diagnosis not present

## 2021-02-05 ENCOUNTER — Ambulatory Visit: Payer: Medicare Other | Admitting: Nurse Practitioner

## 2021-02-06 DIAGNOSIS — Z992 Dependence on renal dialysis: Secondary | ICD-10-CM | POA: Diagnosis not present

## 2021-02-06 DIAGNOSIS — N2581 Secondary hyperparathyroidism of renal origin: Secondary | ICD-10-CM | POA: Diagnosis not present

## 2021-02-06 DIAGNOSIS — N186 End stage renal disease: Secondary | ICD-10-CM | POA: Diagnosis not present

## 2021-02-09 DIAGNOSIS — N2581 Secondary hyperparathyroidism of renal origin: Secondary | ICD-10-CM | POA: Diagnosis not present

## 2021-02-09 DIAGNOSIS — N186 End stage renal disease: Secondary | ICD-10-CM | POA: Diagnosis not present

## 2021-02-09 DIAGNOSIS — Z992 Dependence on renal dialysis: Secondary | ICD-10-CM | POA: Diagnosis not present

## 2021-02-11 DIAGNOSIS — N186 End stage renal disease: Secondary | ICD-10-CM | POA: Diagnosis not present

## 2021-02-11 DIAGNOSIS — Z992 Dependence on renal dialysis: Secondary | ICD-10-CM | POA: Diagnosis not present

## 2021-02-11 DIAGNOSIS — N2581 Secondary hyperparathyroidism of renal origin: Secondary | ICD-10-CM | POA: Diagnosis not present

## 2021-02-13 DIAGNOSIS — N186 End stage renal disease: Secondary | ICD-10-CM | POA: Diagnosis not present

## 2021-02-13 DIAGNOSIS — Z992 Dependence on renal dialysis: Secondary | ICD-10-CM | POA: Diagnosis not present

## 2021-02-13 DIAGNOSIS — N2581 Secondary hyperparathyroidism of renal origin: Secondary | ICD-10-CM | POA: Diagnosis not present

## 2021-02-16 DIAGNOSIS — N186 End stage renal disease: Secondary | ICD-10-CM | POA: Diagnosis not present

## 2021-02-16 DIAGNOSIS — N2581 Secondary hyperparathyroidism of renal origin: Secondary | ICD-10-CM | POA: Diagnosis not present

## 2021-02-16 DIAGNOSIS — Z992 Dependence on renal dialysis: Secondary | ICD-10-CM | POA: Diagnosis not present

## 2021-02-18 DIAGNOSIS — I120 Hypertensive chronic kidney disease with stage 5 chronic kidney disease or end stage renal disease: Secondary | ICD-10-CM | POA: Diagnosis not present

## 2021-02-18 DIAGNOSIS — Z992 Dependence on renal dialysis: Secondary | ICD-10-CM | POA: Diagnosis not present

## 2021-02-18 DIAGNOSIS — N186 End stage renal disease: Secondary | ICD-10-CM | POA: Diagnosis not present

## 2021-02-18 DIAGNOSIS — N2581 Secondary hyperparathyroidism of renal origin: Secondary | ICD-10-CM | POA: Diagnosis not present

## 2021-02-20 DIAGNOSIS — Z992 Dependence on renal dialysis: Secondary | ICD-10-CM | POA: Diagnosis not present

## 2021-02-20 DIAGNOSIS — N2581 Secondary hyperparathyroidism of renal origin: Secondary | ICD-10-CM | POA: Diagnosis not present

## 2021-02-20 DIAGNOSIS — N186 End stage renal disease: Secondary | ICD-10-CM | POA: Diagnosis not present

## 2021-02-23 ENCOUNTER — Other Ambulatory Visit (HOSPITAL_COMMUNITY)
Admission: RE | Admit: 2021-02-23 | Discharge: 2021-02-23 | Disposition: A | Discharge status: Home / Self Care | Payer: Medicare Other | Source: Ambulatory Visit | Attending: Nurse Practitioner | Admitting: Nurse Practitioner

## 2021-02-23 ENCOUNTER — Encounter: Payer: Self-pay | Admitting: Nurse Practitioner

## 2021-02-23 ENCOUNTER — Other Ambulatory Visit: Payer: Self-pay

## 2021-02-23 ENCOUNTER — Ambulatory Visit (INDEPENDENT_AMBULATORY_CARE_PROVIDER_SITE_OTHER): Payer: Medicare Other | Admitting: Nurse Practitioner

## 2021-02-23 VITALS — BP 124/66 | HR 84 | Temp 96.9°F | Ht 63.5 in | Wt 160.0 lb

## 2021-02-23 DIAGNOSIS — N186 End stage renal disease: Secondary | ICD-10-CM | POA: Diagnosis not present

## 2021-02-23 DIAGNOSIS — N760 Acute vaginitis: Secondary | ICD-10-CM | POA: Diagnosis not present

## 2021-02-23 DIAGNOSIS — Z113 Encounter for screening for infections with a predominantly sexual mode of transmission: Secondary | ICD-10-CM | POA: Insufficient documentation

## 2021-02-23 DIAGNOSIS — Z7251 High risk heterosexual behavior: Secondary | ICD-10-CM | POA: Diagnosis not present

## 2021-02-23 DIAGNOSIS — Z992 Dependence on renal dialysis: Secondary | ICD-10-CM | POA: Diagnosis not present

## 2021-02-23 DIAGNOSIS — I150 Renovascular hypertension: Secondary | ICD-10-CM | POA: Diagnosis not present

## 2021-02-23 DIAGNOSIS — E782 Mixed hyperlipidemia: Secondary | ICD-10-CM | POA: Diagnosis not present

## 2021-02-23 DIAGNOSIS — N2581 Secondary hyperparathyroidism of renal origin: Secondary | ICD-10-CM | POA: Diagnosis not present

## 2021-02-23 DIAGNOSIS — I700 Atherosclerosis of aorta: Secondary | ICD-10-CM

## 2021-02-23 DIAGNOSIS — Z01411 Encounter for gynecological examination (general) (routine) with abnormal findings: Secondary | ICD-10-CM | POA: Insufficient documentation

## 2021-02-23 DIAGNOSIS — Z1151 Encounter for screening for human papillomavirus (HPV): Secondary | ICD-10-CM | POA: Insufficient documentation

## 2021-02-23 DIAGNOSIS — Z124 Encounter for screening for malignant neoplasm of cervix: Secondary | ICD-10-CM

## 2021-02-23 DIAGNOSIS — B9689 Other specified bacterial agents as the cause of diseases classified elsewhere: Secondary | ICD-10-CM | POA: Diagnosis not present

## 2021-02-23 MED ORDER — ROSUVASTATIN CALCIUM 10 MG PO TABS: 10.0000 mg | ORAL_TABLET | Freq: Every day | ORAL | 3 refills | Status: DC

## 2021-02-23 NOTE — Assessment & Plan Note (Addendum)
Reports hydralazine was discontinued by nephrlogy. BP Readings from Last 3 Encounters:  02/23/21 124/66  08/25/20 (!) 151/86  07/15/20 124/70   Echocardiogram 2018: Ascending aorta: The ascending aorta was mildly dilated. Continue to monitor BP at home F/up in 31month

## 2021-02-23 NOTE — Progress Notes (Signed)
Subjective:  Patient ID: Victoria [name], female    DOB: [birth year]  Age: 65 y.o. MRN: [id number]  CC: Follow-up (6 month f/u to discuss dx Aortic Atherosclerosis )  Vaginal Discharge The patient's primary symptoms include vaginal discharge. The patient's pertinent negatives include no genital itching, genital odor, pelvic pain or vaginal bleeding. This is a new problem. The current episode started 1 to 4 weeks ago. The problem occurs constantly. The problem has been unchanged. The patient is experiencing no pain. She is not pregnant. Associated symptoms include painful intercourse. Pertinent negatives include no abdominal pain, dysuria, frequency or urgency. The vaginal discharge was thin and white. There has been no bleeding. She has not been passing clots. She has not been passing tissue. The symptoms are aggravated by intercourse. She has tried nothing for the symptoms. She is sexually active. It is unknown whether or not her partner has an STD. She uses hysterectomy for contraception. She is postmenopausal. There is no history of herpes simplex, PID, an STD or vaginosis.   Aortic atherosclerosis (Victoria [name]) Per CXR 05/2020 No tobacco use BP at goal No DM. Current 3yr ASCVD risk of 7.3% Advised about CXR finding and pros vs cons of statin use. She agreed to start crestor '10mg'$  at hs. She is to call office if she develops any adverse side effects F/up in 311monthto repeat lipid panel hepatic panel and CK.  Hypertensive disorder Reports hydralazine was discontinued by nephrlogy. BP Readings from Last 3 Encounters:  02/23/21 124/66  08/25/20 (!) 151/86  07/15/20 124/70   Echocardiogram 2018: Ascending aorta: The ascending aorta was mildly dilated. Continue to monitor BP at home F/up in 11m15monthBP Readings from Last 3 Encounters:  02/23/21 124/66  08/25/20 (!) 151/86  07/15/20 124/70   Reviewed past Medical, Social and Family history today.  Outpatient Medications Prior to Visit   Medication Sig Dispense Refill  . calcium acetate (PHOSLO) 667 MG capsule Take 2,001 mg by mouth 3 (three) times daily.     . fluticasone (FLONASE) 50 MCG/ACT nasal spray Place 2 sprays into both nostrils daily as needed.     . aMarland Kitchenetaminophen (TYLENOL) 500 MG tablet Take by mouth. (Patient not taking: Reported on 02/23/2021)    . Difluprednate 0.05 % EMUL Durezol 0.05 % eye drops  INSTILL 1 DROP INTO RIGHT EYE 3 TIMES A DAY AS DIRECTED (Patient not taking: Reported on 02/23/2021)    . hydrALAZINE (APRESOLINE) 25 MG tablet hydralazine 25 mg tablet   25 mg twice a day by oral route. (Patient not taking: Reported on 02/23/2021)    . HYDROcodone-acetaminophen (NORCO/VICODIN) 5-325 MG tablet hydrocodone 5 mg-acetaminophen 325 mg tablet (Patient not taking: Reported on 02/23/2021)    . ketorolac (ACULAR) 0.5 % ophthalmic solution  (Patient not taking: Reported on 02/23/2021)    . lactulose (CHRONULAC) 10 GM/15ML solution lactulose 10 gram/15 mL oral solution   20 g twice a day by oral route. (Patient not taking: Reported on 02/23/2021)    . meloxicam (MOBIC) 15 MG tablet Take 1 tablet by mouth daily. (Patient not taking: Reported on 02/23/2021)    . omeprazole (PRILOSEC) 20 MG capsule omeprazole 20 mg capsule,delayed release   20 mg by oral route. (Patient not taking: No sig reported)     No facility-administered medications prior to visit.    ROS See HPI  Objective:  BP 124/66 (BP Location: Left Arm, Patient Position: Sitting, Cuff Size: Normal)   Pulse 84   Temp (!) 96.9  F (36.1 C) (Temporal)   Ht 5' 3.5" (1.613 m)   Wt 160 lb (72.6 kg)   LMP  (LMP Unknown)   SpO2 99%   BMI 27.90 kg/m   Physical Exam Genitourinary:    General: Normal vulva.     Labia:        Right: No rash, tenderness or lesion.        Left: No rash, tenderness or lesion.      Vagina: Vaginal discharge present. No erythema, tenderness or bleeding.     Adnexa:        Right: No tenderness.         Left: No tenderness.        Comments: Cervix surgically removed Musculoskeletal:     Right lower leg: No edema.     Left lower leg: No edema.  Lymphadenopathy:     Lower Body: No right inguinal adenopathy. No left inguinal adenopathy.  Neurological:     Mental Status: She is alert.    Assessment & Plan:  This visit occurred during the SARS-CoV-2 public health emergency.  Safety protocols were in place, including screening questions prior to the visit, additional usage of staff PPE, and extensive cleaning of exam room while observing appropriate contact time as indicated for disinfecting solutions.   Victoria [name] was seen today for follow-up.  Diagnoses and all orders for this visit:  Mixed hyperlipidemia -     rosuvastatin (CRESTOR) 10 MG tablet; Take 1 tablet (10 mg total) by mouth daily.  Acute vaginitis -     Cervicovaginal ancillary only( Victoria [name])  Encounter for Papanicolaou smear for cervical cancer screening -     Cytology - PAP( Victoria [name])  Aortic atherosclerosis (Victoria [name]) -     rosuvastatin (CRESTOR) 10 MG tablet; Take 1 tablet (10 mg total) by mouth daily.  Renovascular hypertension   Problem List Items Addressed This Visit      Cardiovascular and Mediastinum   Aortic atherosclerosis (Victoria [name])    Per CXR 05/2020 No tobacco use BP at goal No DM. Current 33yr ASCVD risk of 7.3% Advised about CXR finding and pros vs cons of statin use. She agreed to start crestor '10mg'$  at hs. She is to call office if she develops any adverse side effects F/up in 335monthto repeat lipid panel hepatic panel and CK.      Relevant Medications   rosuvastatin (CRESTOR) 10 MG tablet   Hypertensive disorder    Reports hydralazine was discontinued by nephrlogy. BP Readings from Last 3 Encounters:  02/23/21 124/66  08/25/20 (!) 151/86  07/15/20 124/70   Echocardiogram 2018: Ascending aorta: The ascending aorta was mildly dilated. Continue to monitor BP at home F/up in 30m48month    Relevant Medications    rosuvastatin (CRESTOR) 10 MG tablet     Other   Hyperlipidemia - Primary   Relevant Medications   rosuvastatin (CRESTOR) 10 MG tablet    Other Visit Diagnoses    Acute vaginitis       Relevant Orders   Cervicovaginal ancillary only( Victoria [name])   Encounter for Papanicolaou smear for cervical cancer screening       Relevant Orders   Cytology - PAP( [place])      Follow-up: Return in about 3 months (around 05/26/2021) for HTN and , hyperlipidemia (fasting).  ChaWilfred LacyP

## 2021-02-23 NOTE — Assessment & Plan Note (Signed)
Per CXR 05/2020 No tobacco use BP at goal No DM. Current 28yr ASCVD risk of 7.3% Advised about CXR finding and pros vs cons of statin use. She agreed to start crestor '10mg'$  at hs. She is to call office if she develops any adverse side effects F/up in 339monthto repeat lipid panel hepatic panel and CK.

## 2021-02-23 NOTE — Patient Instructions (Signed)
Start crestor  Rosuvastatin Tablets What is this medicine? ROSUVASTATIN (roe SOO va sta tin) is known as a HMG-CoA reductase inhibitor or 'statin'. It lowers cholesterol and triglycerides in the blood. This drug may also reduce the risk of heart attack, stroke, or other health problems in patients with risk factors for heart disease. Diet and lifestyle changes are often used with this drug. This medicine may be used for other purposes; ask your health care provider or pharmacist if you have questions. COMMON BRAND NAME(S): Crestor What should I tell my health care provider before I take this medicine? They need to know if you have any of these conditions:  diabetes  if you often drink alcohol  history of stroke  kidney disease  liver disease  muscle aches or weakness  thyroid disease  an unusual or allergic reaction to rosuvastatin, other medicines, foods, dyes, or preservatives  pregnant or trying to get pregnant  breast-feeding How should I use this medicine? Take this medicine by mouth with a glass of water. Follow the directions on the prescription label. Do not cut, crush or chew this medicine. You can take this medicine with or without food. Take your doses at regular intervals. Do not take your medicine more often than directed. Talk to your pediatrician regarding the use of this medicine in children. While this drug may be prescribed for children as young as 82 years old for selected conditions, precautions do apply. Overdosage: If you think you have taken too much of this medicine contact a poison control center or emergency room at once. NOTE: This medicine is only for you. Do not share this medicine with others. What if I miss a dose? If you miss a dose, take it as soon as you can. If your next dose is to be taken in less than 12 hours, then do not take the missed dose. Take the next dose at your regular time. Do not take double or extra doses. What may interact with  this medicine? Do not take this medicine with any of the following medications:  herbal medicines like red yeast rice This medicine may also interact with the following medications:  alcohol  antacids containing aluminum hydroxide or magnesium hydroxide  cyclosporine  other medicines for high cholesterol  some medicines for HIV infection  warfarin This list may not describe all possible interactions. Give your health care provider a list of all the medicines, herbs, non-prescription drugs, or dietary supplements you use. Also tell them if you smoke, drink alcohol, or use illegal drugs. Some items may interact with your medicine. What should I watch for while using this medicine? Visit your doctor or health care professional for regular check-ups. You may need regular tests to make sure your liver is working properly. Your health care professional may tell you to stop taking this medicine if you develop muscle problems. If your muscle problems do not go away after stopping this medicine, contact your health care professional. Do not become pregnant while taking this medicine. Women should inform their health care professional if they wish to become pregnant or think they might be pregnant. There is a potential for serious side effects to an unborn child. Talk to your health care professional or pharmacist for more information. Do not breast-feed an infant while taking this medicine. This medicine may increase blood sugar. Ask your healthcare provider if changes in diet or medicines are needed if you have diabetes. If you are going to need surgery or other procedure,  tell your doctor that you are using this medicine. This drug is only part of a total heart-health program. Your doctor or a dietician can suggest a low-cholesterol and low-fat diet to help. Avoid alcohol and smoking, and keep a proper exercise schedule. This medicine may cause a decrease in Co-Enzyme Q-10. You should make sure  that you get enough Co-Enzyme Q-10 while you are taking this medicine. Discuss the foods you eat and the vitamins you take with your health care professional. What side effects may I notice from receiving this medicine? Side effects that you should report to your doctor or health care professional as soon as possible:  allergic reactions like skin rash, itching or hives, swelling of the face, lips, or tongue  confusion  joint pain  loss of memory  redness, blistering, peeling or loosening of the skin, including inside the mouth  signs and symptoms of high blood sugar such as being more thirsty or hungry or having to urinate more than normal. You may also feel very tired or have blurry vision.  signs and symptoms of muscle injury like dark urine; trouble passing urine or change in the amount of urine; unusually weak or tired; muscle pain or side or back pain  yellowing of the eyes or skin Side effects that usually do not require medical attention (report to your doctor or health care professional if they continue or are bothersome):  constipation  diarrhea  dizziness  gas  headache  nausea  stomach pain  trouble sleeping  upset stomach This list may not describe all possible side effects. Call your doctor for medical advice about side effects. You may report side effects to FDA at 1-800-FDA-1088. Where should I keep my medicine? Keep out of the reach of children and pets. Store between 20 and 25 degrees C (68 and 77 degrees F). Get rid of any unused medicine after the expiration date. To get rid of medicines that are no longer needed or have expired:  Take the medicine to a medicine take-back program. Check with your pharmacy or law enforcement to find a location.  If you cannot return the medicine, check the label or package insert to see if the medicine should be thrown out in the garbage or flushed down the toilet. If you are not sure, ask your health care provider. If  it is safe to put it in the trash, take the medicine out of the container. Mix the medicine with cat litter, dirt, coffee grounds, or other unwanted substance. Seal the mixture in a bag or container. Put it in the trash. NOTE: This sheet is a summary. It may not cover all possible information. If you have questions about this medicine, talk to your doctor, pharmacist, or health care provider.  2021 Elsevier/Gold Standard (2020-07-20 09:55:07)

## 2021-02-24 LAB — CYTOLOGY - PAP
Comment: NEGATIVE
Diagnosis: NEGATIVE
High risk HPV: NEGATIVE

## 2021-02-24 LAB — CERVICOVAGINAL ANCILLARY ONLY
Bacterial Vaginitis (gardnerella): POSITIVE — AB
Candida Glabrata: NEGATIVE
Candida Vaginitis: NEGATIVE
Chlamydia: NEGATIVE
Comment: NEGATIVE
Comment: NEGATIVE
Comment: NEGATIVE
Comment: NEGATIVE
Comment: NEGATIVE
Comment: NORMAL
Neisseria Gonorrhea: NEGATIVE
Trichomonas: NEGATIVE

## 2021-02-24 MED ORDER — METRONIDAZOLE 0.75 % VA GEL: 1.0000 | Freq: Every day | VAGINAL | 0 refills | Status: DC

## 2021-02-24 NOTE — Addendum Note (Signed)
Addended by: Leana Gamer on: 02/24/2021 04:43 PM   Modules accepted: Orders

## 2021-02-25 DIAGNOSIS — N186 End stage renal disease: Secondary | ICD-10-CM | POA: Diagnosis not present

## 2021-02-25 DIAGNOSIS — Z992 Dependence on renal dialysis: Secondary | ICD-10-CM | POA: Diagnosis not present

## 2021-02-25 DIAGNOSIS — N2581 Secondary hyperparathyroidism of renal origin: Secondary | ICD-10-CM | POA: Diagnosis not present

## 2021-02-27 DIAGNOSIS — Z992 Dependence on renal dialysis: Secondary | ICD-10-CM | POA: Diagnosis not present

## 2021-02-27 DIAGNOSIS — N2581 Secondary hyperparathyroidism of renal origin: Secondary | ICD-10-CM | POA: Diagnosis not present

## 2021-02-27 DIAGNOSIS — N186 End stage renal disease: Secondary | ICD-10-CM | POA: Diagnosis not present

## 2021-03-02 DIAGNOSIS — N186 End stage renal disease: Secondary | ICD-10-CM | POA: Diagnosis not present

## 2021-03-02 DIAGNOSIS — N2581 Secondary hyperparathyroidism of renal origin: Secondary | ICD-10-CM | POA: Diagnosis not present

## 2021-03-02 DIAGNOSIS — Z992 Dependence on renal dialysis: Secondary | ICD-10-CM | POA: Diagnosis not present

## 2021-03-03 ENCOUNTER — Telehealth: Payer: Self-pay

## 2021-03-03 NOTE — Telephone Encounter (Signed)
We are not able to reach Victoria [name] to report her normal Pap result. Also Rx has been sent to the drug store.   (I called the drug store and  confirmed that the rx was picked up).

## 2021-03-03 NOTE — Telephone Encounter (Signed)
-----   Message from Flossie Buffy, NP sent at 02/25/2021  7:55 AM EDT ----- normal

## 2021-03-04 DIAGNOSIS — N186 End stage renal disease: Secondary | ICD-10-CM | POA: Diagnosis not present

## 2021-03-04 DIAGNOSIS — N2581 Secondary hyperparathyroidism of renal origin: Secondary | ICD-10-CM | POA: Diagnosis not present

## 2021-03-04 DIAGNOSIS — Z992 Dependence on renal dialysis: Secondary | ICD-10-CM | POA: Diagnosis not present

## 2021-03-06 DIAGNOSIS — Z992 Dependence on renal dialysis: Secondary | ICD-10-CM | POA: Diagnosis not present

## 2021-03-06 DIAGNOSIS — N2581 Secondary hyperparathyroidism of renal origin: Secondary | ICD-10-CM | POA: Diagnosis not present

## 2021-03-06 DIAGNOSIS — N186 End stage renal disease: Secondary | ICD-10-CM | POA: Diagnosis not present

## 2021-03-09 DIAGNOSIS — Z992 Dependence on renal dialysis: Secondary | ICD-10-CM | POA: Diagnosis not present

## 2021-03-09 DIAGNOSIS — N2581 Secondary hyperparathyroidism of renal origin: Secondary | ICD-10-CM | POA: Diagnosis not present

## 2021-03-09 DIAGNOSIS — N186 End stage renal disease: Secondary | ICD-10-CM | POA: Diagnosis not present

## 2021-03-11 DIAGNOSIS — Z992 Dependence on renal dialysis: Secondary | ICD-10-CM | POA: Diagnosis not present

## 2021-03-11 DIAGNOSIS — N2581 Secondary hyperparathyroidism of renal origin: Secondary | ICD-10-CM | POA: Diagnosis not present

## 2021-03-11 DIAGNOSIS — N186 End stage renal disease: Secondary | ICD-10-CM | POA: Diagnosis not present

## 2021-03-13 DIAGNOSIS — N2581 Secondary hyperparathyroidism of renal origin: Secondary | ICD-10-CM | POA: Diagnosis not present

## 2021-03-13 DIAGNOSIS — N186 End stage renal disease: Secondary | ICD-10-CM | POA: Diagnosis not present

## 2021-03-13 DIAGNOSIS — Z992 Dependence on renal dialysis: Secondary | ICD-10-CM | POA: Diagnosis not present

## 2021-03-16 DIAGNOSIS — N186 End stage renal disease: Secondary | ICD-10-CM | POA: Diagnosis not present

## 2021-03-16 DIAGNOSIS — Z992 Dependence on renal dialysis: Secondary | ICD-10-CM | POA: Diagnosis not present

## 2021-03-16 DIAGNOSIS — N2581 Secondary hyperparathyroidism of renal origin: Secondary | ICD-10-CM | POA: Diagnosis not present

## 2021-03-18 DIAGNOSIS — Z992 Dependence on renal dialysis: Secondary | ICD-10-CM | POA: Diagnosis not present

## 2021-03-18 DIAGNOSIS — N2581 Secondary hyperparathyroidism of renal origin: Secondary | ICD-10-CM | POA: Diagnosis not present

## 2021-03-18 DIAGNOSIS — N186 End stage renal disease: Secondary | ICD-10-CM | POA: Diagnosis not present

## 2021-03-20 DIAGNOSIS — N186 End stage renal disease: Secondary | ICD-10-CM | POA: Diagnosis not present

## 2021-03-20 DIAGNOSIS — N2581 Secondary hyperparathyroidism of renal origin: Secondary | ICD-10-CM | POA: Diagnosis not present

## 2021-03-20 DIAGNOSIS — I120 Hypertensive chronic kidney disease with stage 5 chronic kidney disease or end stage renal disease: Secondary | ICD-10-CM | POA: Diagnosis not present

## 2021-03-20 DIAGNOSIS — Z992 Dependence on renal dialysis: Secondary | ICD-10-CM | POA: Diagnosis not present

## 2021-03-23 DIAGNOSIS — Z992 Dependence on renal dialysis: Secondary | ICD-10-CM | POA: Diagnosis not present

## 2021-03-23 DIAGNOSIS — N2581 Secondary hyperparathyroidism of renal origin: Secondary | ICD-10-CM | POA: Diagnosis not present

## 2021-03-23 DIAGNOSIS — N186 End stage renal disease: Secondary | ICD-10-CM | POA: Diagnosis not present

## 2021-03-25 DIAGNOSIS — N2581 Secondary hyperparathyroidism of renal origin: Secondary | ICD-10-CM | POA: Diagnosis not present

## 2021-03-25 DIAGNOSIS — N186 End stage renal disease: Secondary | ICD-10-CM | POA: Diagnosis not present

## 2021-03-25 DIAGNOSIS — Z992 Dependence on renal dialysis: Secondary | ICD-10-CM | POA: Diagnosis not present

## 2021-03-27 DIAGNOSIS — N186 End stage renal disease: Secondary | ICD-10-CM | POA: Diagnosis not present

## 2021-03-27 DIAGNOSIS — Z992 Dependence on renal dialysis: Secondary | ICD-10-CM | POA: Diagnosis not present

## 2021-03-27 DIAGNOSIS — N2581 Secondary hyperparathyroidism of renal origin: Secondary | ICD-10-CM | POA: Diagnosis not present

## 2021-03-30 DIAGNOSIS — Z992 Dependence on renal dialysis: Secondary | ICD-10-CM | POA: Diagnosis not present

## 2021-03-30 DIAGNOSIS — N2581 Secondary hyperparathyroidism of renal origin: Secondary | ICD-10-CM | POA: Diagnosis not present

## 2021-03-30 DIAGNOSIS — N186 End stage renal disease: Secondary | ICD-10-CM | POA: Diagnosis not present

## 2021-04-01 DIAGNOSIS — N186 End stage renal disease: Secondary | ICD-10-CM | POA: Diagnosis not present

## 2021-04-01 DIAGNOSIS — N2581 Secondary hyperparathyroidism of renal origin: Secondary | ICD-10-CM | POA: Diagnosis not present

## 2021-04-01 DIAGNOSIS — Z992 Dependence on renal dialysis: Secondary | ICD-10-CM | POA: Diagnosis not present

## 2021-04-03 DIAGNOSIS — Z992 Dependence on renal dialysis: Secondary | ICD-10-CM | POA: Diagnosis not present

## 2021-04-03 DIAGNOSIS — N186 End stage renal disease: Secondary | ICD-10-CM | POA: Diagnosis not present

## 2021-04-03 DIAGNOSIS — N2581 Secondary hyperparathyroidism of renal origin: Secondary | ICD-10-CM | POA: Diagnosis not present

## 2021-04-06 DIAGNOSIS — N186 End stage renal disease: Secondary | ICD-10-CM | POA: Diagnosis not present

## 2021-04-06 DIAGNOSIS — Z992 Dependence on renal dialysis: Secondary | ICD-10-CM | POA: Diagnosis not present

## 2021-04-06 DIAGNOSIS — N2581 Secondary hyperparathyroidism of renal origin: Secondary | ICD-10-CM | POA: Diagnosis not present

## 2021-04-08 DIAGNOSIS — N186 End stage renal disease: Secondary | ICD-10-CM | POA: Diagnosis not present

## 2021-04-08 DIAGNOSIS — N2581 Secondary hyperparathyroidism of renal origin: Secondary | ICD-10-CM | POA: Diagnosis not present

## 2021-04-08 DIAGNOSIS — Z992 Dependence on renal dialysis: Secondary | ICD-10-CM | POA: Diagnosis not present

## 2021-04-10 DIAGNOSIS — Z992 Dependence on renal dialysis: Secondary | ICD-10-CM | POA: Diagnosis not present

## 2021-04-10 DIAGNOSIS — N2581 Secondary hyperparathyroidism of renal origin: Secondary | ICD-10-CM | POA: Diagnosis not present

## 2021-04-10 DIAGNOSIS — N186 End stage renal disease: Secondary | ICD-10-CM | POA: Diagnosis not present

## 2021-04-13 ENCOUNTER — Other Ambulatory Visit: Payer: Self-pay | Admitting: Nephrology

## 2021-04-13 DIAGNOSIS — Z1231 Encounter for screening mammogram for malignant neoplasm of breast: Secondary | ICD-10-CM

## 2021-04-13 DIAGNOSIS — Z992 Dependence on renal dialysis: Secondary | ICD-10-CM | POA: Diagnosis not present

## 2021-04-13 DIAGNOSIS — N186 End stage renal disease: Secondary | ICD-10-CM | POA: Diagnosis not present

## 2021-04-13 DIAGNOSIS — N2581 Secondary hyperparathyroidism of renal origin: Secondary | ICD-10-CM | POA: Diagnosis not present

## 2021-04-15 DIAGNOSIS — N186 End stage renal disease: Secondary | ICD-10-CM | POA: Diagnosis not present

## 2021-04-15 DIAGNOSIS — N2581 Secondary hyperparathyroidism of renal origin: Secondary | ICD-10-CM | POA: Diagnosis not present

## 2021-04-15 DIAGNOSIS — Z992 Dependence on renal dialysis: Secondary | ICD-10-CM | POA: Diagnosis not present

## 2021-04-17 DIAGNOSIS — Z992 Dependence on renal dialysis: Secondary | ICD-10-CM | POA: Diagnosis not present

## 2021-04-17 DIAGNOSIS — N186 End stage renal disease: Secondary | ICD-10-CM | POA: Diagnosis not present

## 2021-04-17 DIAGNOSIS — N2581 Secondary hyperparathyroidism of renal origin: Secondary | ICD-10-CM | POA: Diagnosis not present

## 2021-04-20 DIAGNOSIS — D509 Iron deficiency anemia, unspecified: Secondary | ICD-10-CM | POA: Diagnosis not present

## 2021-04-20 DIAGNOSIS — N186 End stage renal disease: Secondary | ICD-10-CM | POA: Diagnosis not present

## 2021-04-20 DIAGNOSIS — N2581 Secondary hyperparathyroidism of renal origin: Secondary | ICD-10-CM | POA: Diagnosis not present

## 2021-04-20 DIAGNOSIS — D689 Coagulation defect, unspecified: Secondary | ICD-10-CM | POA: Diagnosis not present

## 2021-04-20 DIAGNOSIS — I120 Hypertensive chronic kidney disease with stage 5 chronic kidney disease or end stage renal disease: Secondary | ICD-10-CM | POA: Diagnosis not present

## 2021-04-20 DIAGNOSIS — Z992 Dependence on renal dialysis: Secondary | ICD-10-CM | POA: Diagnosis not present

## 2021-04-22 DIAGNOSIS — D689 Coagulation defect, unspecified: Secondary | ICD-10-CM | POA: Diagnosis not present

## 2021-04-22 DIAGNOSIS — N2581 Secondary hyperparathyroidism of renal origin: Secondary | ICD-10-CM | POA: Diagnosis not present

## 2021-04-22 DIAGNOSIS — Z992 Dependence on renal dialysis: Secondary | ICD-10-CM | POA: Diagnosis not present

## 2021-04-22 DIAGNOSIS — D509 Iron deficiency anemia, unspecified: Secondary | ICD-10-CM | POA: Diagnosis not present

## 2021-04-22 DIAGNOSIS — N186 End stage renal disease: Secondary | ICD-10-CM | POA: Diagnosis not present

## 2021-04-24 DIAGNOSIS — N2581 Secondary hyperparathyroidism of renal origin: Secondary | ICD-10-CM | POA: Diagnosis not present

## 2021-04-24 DIAGNOSIS — D689 Coagulation defect, unspecified: Secondary | ICD-10-CM | POA: Diagnosis not present

## 2021-04-24 DIAGNOSIS — Z992 Dependence on renal dialysis: Secondary | ICD-10-CM | POA: Diagnosis not present

## 2021-04-24 DIAGNOSIS — N186 End stage renal disease: Secondary | ICD-10-CM | POA: Diagnosis not present

## 2021-04-24 DIAGNOSIS — D509 Iron deficiency anemia, unspecified: Secondary | ICD-10-CM | POA: Diagnosis not present

## 2021-04-27 DIAGNOSIS — D509 Iron deficiency anemia, unspecified: Secondary | ICD-10-CM | POA: Diagnosis not present

## 2021-04-27 DIAGNOSIS — D689 Coagulation defect, unspecified: Secondary | ICD-10-CM | POA: Diagnosis not present

## 2021-04-27 DIAGNOSIS — N2581 Secondary hyperparathyroidism of renal origin: Secondary | ICD-10-CM | POA: Diagnosis not present

## 2021-04-27 DIAGNOSIS — Z992 Dependence on renal dialysis: Secondary | ICD-10-CM | POA: Diagnosis not present

## 2021-04-27 DIAGNOSIS — N186 End stage renal disease: Secondary | ICD-10-CM | POA: Diagnosis not present

## 2021-04-29 DIAGNOSIS — N186 End stage renal disease: Secondary | ICD-10-CM | POA: Diagnosis not present

## 2021-04-29 DIAGNOSIS — Z992 Dependence on renal dialysis: Secondary | ICD-10-CM | POA: Diagnosis not present

## 2021-04-29 DIAGNOSIS — D689 Coagulation defect, unspecified: Secondary | ICD-10-CM | POA: Diagnosis not present

## 2021-04-29 DIAGNOSIS — N2581 Secondary hyperparathyroidism of renal origin: Secondary | ICD-10-CM | POA: Diagnosis not present

## 2021-04-29 DIAGNOSIS — D509 Iron deficiency anemia, unspecified: Secondary | ICD-10-CM | POA: Diagnosis not present

## 2021-05-01 ENCOUNTER — Other Ambulatory Visit: Payer: Self-pay

## 2021-05-01 ENCOUNTER — Encounter (HOSPITAL_COMMUNITY): Payer: Self-pay

## 2021-05-01 ENCOUNTER — Ambulatory Visit (HOSPITAL_COMMUNITY)
Admission: EM | Admit: 2021-05-01 | Discharge: 2021-05-01 | Disposition: A | Discharge status: Home / Self Care | Payer: Medicare Other | Attending: Student | Admitting: Student

## 2021-05-01 DIAGNOSIS — Z1152 Encounter for screening for COVID-19: Secondary | ICD-10-CM | POA: Insufficient documentation

## 2021-05-01 DIAGNOSIS — D689 Coagulation defect, unspecified: Secondary | ICD-10-CM | POA: Diagnosis not present

## 2021-05-01 DIAGNOSIS — N186 End stage renal disease: Secondary | ICD-10-CM | POA: Insufficient documentation

## 2021-05-01 DIAGNOSIS — Z992 Dependence on renal dialysis: Secondary | ICD-10-CM | POA: Insufficient documentation

## 2021-05-01 DIAGNOSIS — J209 Acute bronchitis, unspecified: Secondary | ICD-10-CM | POA: Insufficient documentation

## 2021-05-01 DIAGNOSIS — N2581 Secondary hyperparathyroidism of renal origin: Secondary | ICD-10-CM | POA: Diagnosis not present

## 2021-05-01 DIAGNOSIS — D509 Iron deficiency anemia, unspecified: Secondary | ICD-10-CM | POA: Diagnosis not present

## 2021-05-01 LAB — SARS CORONAVIRUS 2 (TAT 6-24 HRS): SARS Coronavirus 2: NEGATIVE

## 2021-05-01 MED ORDER — PREDNISONE 10 MG (21) PO TBPK: ORAL_TABLET | Freq: Every day | ORAL | 0 refills | Status: DC

## 2021-05-01 MED ORDER — ALBUTEROL SULFATE HFA 108 (90 BASE) MCG/ACT IN AERS: 1.0000 | INHALATION_SPRAY | Freq: Four times a day (QID) | RESPIRATORY_TRACT | 0 refills | Status: DC | PRN

## 2021-05-01 NOTE — ED Triage Notes (Signed)
Pt presents with nasal drainage, sinus congestion, and productive mucus with some bilateral flank pain from coughing X 1 week.

## 2021-05-01 NOTE — ED Provider Notes (Signed)
[place]    CSN: [id number] Arrival date & time: 05/01/21  1450      History   Chief Complaint Chief Complaint  Patient presents with   URI   Flank Pain    HPI Victoria [name] is a 65 y.o. female presenting with viral symptoms for about 1 week.  Medical history chronic bronchitis, end-stage renal disease, arthritis, pneumonia.  Endorses 1 week of nasal congestion, hacking productive cough with white sputum.  Wheezing at night. Endorses back pain due to frequent coughing. Denies fevers/chills, n/v/d, shortness of breath, chest pain, facial pain, teeth pain, headaches, sore throat, loss of taste/smell, swollen lymph nodes, ear pain. She is out of her albuterol inhaler. Denies urinary symptoms.   HPI  Past Medical History:  Diagnosis Date   Arthritis    Chronic bronchitis ([place])    Complication of anesthesia ~ 2011   "they gave me a medicine that swolled me and mouth burning up" ([id number])   Complication of anesthesia    slow to wake up   ESRD (end stage renal disease) on dialysis [facility]) Nephrologist-- dr deterding   ESRD due to HTN-; "M/W/F; [place]" (11/28/2015   GERD (gastroesophageal reflux disease)    no meds, diet controlled   History of acute pulmonary edema    2003   Hypertension    no longer on medications   Left patella fracture    Pneumonia    as a child   Seasonal allergies    Secondary hyperparathyroidism, renal ([place])    s/p  total parathyroidectomy  2014   Thyroid disease    Wears glasses    Wound dehiscence, surgical 11/28/2015    Patient Active Problem List   Diagnosis Date Noted   Aortic atherosclerosis ([place]) 02/23/2021   Bilateral carpal tunnel syndrome 02/26/2019   Bilateral sensorineural hearing loss 01/16/2019   Dehiscence of operative wound [id number]   Complication associated with orthopedic device ([place]) 10/02/2015   Deficiency of macronutrients 10/04/2013   Aftercare following surgery of the circulatory system, NEC  03/30/2013   Mechanical complication of other vascular device, implant, and graft 01/12/2013   Disorder of calcium metabolism 10/27/2012   Hyperlipidemia 10/25/2012   Chest pain, mid sternal 08/23/2012   Hypoparathyroidism ([place]) 07/10/2012   Thyroid nodule 03/22/2011   Hypertensive disorder 03/10/2011   Kidney disease 03/10/2011   Hyperparathyroidism due to renal insufficiency ([place]) 06/21/2003   Anemia in chronic kidney disease 06/28/2001   Iron deficiency anemia 06/28/2001   End-stage renal disease ([place]) 09/20/1992    Past Surgical History:  Procedure Laterality Date   ANKLE FRACTURE SURGERY Right ~ 2005   "has pins in it"   APPLICATION OF WOUND VAC Left [id number]   knee   APPLICATION OF WOUND VAC Left 11/28/2015   Procedure: APPLICATION OF WOUND VAC;  Surgeon: Rod Can, MD;  Location: [place];  Service: Orthopedics;  Laterality: Left;   ARTERIOVENOUS GRAFT PLACEMENT  03-25-2005   Right forearm  w/  multiple Revision's    CARDIOVASCULAR STRESS TEST  08-24-2012   abnormal nuclear study/  inferolateral and anteroseptal areas of scar,  no ischemia/  normal LV function and wall motion, ef 77%   DIALYSIS FISTULA CREATION  1992   left upper arm ---  w/  Multiple Revision's until 2006   ECTOPIC PREGNANCY SURGERY  1970's   FRACTURE SURGERY     HARDWARE REMOVAL Right 07/10/2020   Procedure: Removal of deep implant right fibula and tibia; steroid injection right ankle;  Surgeon: Wylene Simmer, MD;  Location: [place];  Service: Orthopedics;  Laterality: Right;   I & D EXTREMITY Left 11/28/2015   Procedure: IRRIGATION AND DEBRIDEMENT LEFT KNEE WOUND ;  Surgeon: Rod Can, MD;  Location: [place];  Service: Orthopedics;  Laterality: Left;   INCISION AND DRAINAGE OF WOUND Left 11/28/2015   knee   ORIF PATELLA Left 10/31/2015   Procedure: OPEN REDUCTION INTERNAL (ORIF) FIXATION LEFT PATELLA;  Surgeon: Rod Can, MD;  Location: [place];  Service: Orthopedics;   Laterality: Left;   PATELLAR TENDON REPAIR  10/03/2012   Procedure: PATELLA TENDON REPAIR;  Surgeon: Marin Shutter, MD;  Location: [place];  Service: Orthopedics;  Laterality: Right;   PATELLECTOMY  10/03/2012   Procedure: PATELLECTOMY;  Surgeon: Marin Shutter, MD;  Location: [place];  Service: Orthopedics;  Laterality: Right;  RIGHT PARTIAL PATELLECTOMY AND PATELLA TENDON REPAIR   REVISION OF ARTERIOVENOUS GORETEX GRAFT  09/27/2012   Procedure: REVISION OF ARTERIOVENOUS GORETEX GRAFT;  Surgeon: Mal Misty, MD;  Location: [place];  Service: Vascular;  Laterality: Right;  1) Replacement of venous half of loop with 38m Gortex graft  2) Excision of erroded pseudoaneurysm of graft with primary closure.   REVISION OF ARTERIOVENOUS GORETEX GRAFT Right 01/23/2013   Procedure: REVISION OF ARTERIOVENOUS GORETEX GRAFT;  Surgeon: BConrad Thomaston MD;  [facility]  Service: Vascular;  Laterality: Right;  Using piece of 620mx 20cm Gortex graft.    REVISION OF ARTERIOVENOUS GORETEX GRAFT Right 10/07/2015   Procedure: REVISION OF Right arm ARTERIOVENOUS GORETEX GRAFT;  Surgeon: ChElam DutchMD;  [place];  Laterality: Right;   REVISION OF ARTERIOVENOUS GORETEX GRAFT Right 02/17/2016   Procedure: REVISION OF ARTERIOVENOUS GORETEX GRAFT;  Surgeon: ChElam DutchMD;  [place] [facility] Service: [place];  Laterality: Right;   TOTAL ABDOMINAL HYSTERECTOMY  03-05-2005   w/  Right Ovarian Cystectomy   TOTAL PARATHYROIDECTOMY/  THYROID ISTHMUSECTOMY/  AUTOTRANSPLANTATION PARATHYROID TISSUE TO LEFT BRACHIORIADIALIS MUSCLE  02-18-2011   TRANSTHORACIC ECHOCARDIOGRAM  10-31-2012   mild LVH,  grade 1 diastolic dysfunction,  ef 55-65%/  mild MR/  trivial TR    OB History   No obstetric history on file.      Home Medications    Prior to Admission medications   Medication Sig Start Date End Date Taking? Authorizing Provider  albuterol (VENTOLIN HFA) 108 (90 Base) MCG/ACT inhaler Inhale  1-2 puffs into the lungs every 6 (six) hours as needed for wheezing or shortness of breath. 05/01/21  Yes GrHazel SamsPA-C  predniSONE (STERAPRED UNI-PAK 21 TAB) 10 MG (21) TBPK tablet Take by mouth daily. Take 6 tabs by mouth daily  for 2 days, then 5 tabs for 2 days, then 4 tabs for 2 days, then 3 tabs for 2 days, 2 tabs for 2 days, then 1 tab by mouth daily for 2 days 05/01/21  Yes GrHazel SamsPA-C  calcium acetate (PHOSLO) 667 MG capsule Take 2,001 mg by mouth 3 (three) times daily.  05/28/20   [provider]  fluticasone (FLONASE) 50 MCG/ACT nasal spray Place 2 sprays into both nostrils daily as needed.     [provider]  rosuvastatin (CRESTOR) 10 MG tablet Take 1 tablet (10 mg total) by mouth daily. 02/23/21   Nche, ChCharlene BrookeNP    Family History Family History  Problem Relation Age of Onset   Healthy Mother  Hypertension Father    Kidney failure Father    Hypertension Sister    Thyroid disease Sister     Social History Social History   Tobacco Use   Smoking status: Never   Smokeless tobacco: Never  Vaping Use   Vaping Use: Never used  Substance Use Topics   Alcohol use: Yes    Alcohol/week: 0.0 standard drinks    Comment: occasionally drinks a few beers   Drug use: No     Allergies   Amlodipine, Cefazolin, Cephalosporins, and Lisinopril   Review of Systems Review of Systems  Constitutional:  Negative for appetite change, chills and fever.  HENT:  Positive for congestion. Negative for ear pain, rhinorrhea, sinus pressure, sinus pain and sore throat.   Eyes:  Negative for redness and visual disturbance.  Respiratory:  Positive for cough. Negative for chest tightness, shortness of breath and wheezing.   Cardiovascular:  Negative for chest pain and palpitations.  Gastrointestinal:  Negative for abdominal pain, constipation, diarrhea, nausea and vomiting.  Genitourinary:  Negative for dysuria, frequency and urgency.  Musculoskeletal:   Negative for myalgias.  Neurological:  Negative for dizziness, weakness and headaches.  Psychiatric/Behavioral:  Negative for confusion.   All other systems reviewed and are negative.   Physical Exam Triage Vital Signs ED Triage Vitals  Enc Vitals Group     BP 05/01/21 1539 128/77     Pulse Rate 05/01/21 1539 89     Resp --      Temp 05/01/21 1539 98.6 F (37 C)     Temp Source 05/01/21 1539 Oral     SpO2 05/01/21 1539 100 %     Weight --      Height --      Head Circumference --      Peak Flow --      Pain Score 05/01/21 1541 5     Pain Loc --      Pain Edu? --      Excl. in [place]? --    No data found.  Updated Vital Signs BP 128/77 (BP Location: Left Arm)   Pulse 89   Temp 98.6 F (37 C) (Oral)   LMP  (LMP Unknown)   SpO2 100%   Visual Acuity Right Eye Distance:   Left Eye Distance:   Bilateral Distance:    Right Eye Near:   Left Eye Near:    Bilateral Near:     Physical Exam Vitals reviewed.  Constitutional:      General: She is not in acute distress.    Appearance: Normal appearance. She is not ill-appearing.  HENT:     Head: Normocephalic and atraumatic.     Right Ear: Tympanic membrane, ear canal and external ear normal. No tenderness. No middle ear effusion. There is no impacted cerumen. Tympanic membrane is not perforated, erythematous, retracted or bulging.     Left Ear: Tympanic membrane, ear canal and external ear normal. No tenderness.  No middle ear effusion. There is no impacted cerumen. Tympanic membrane is not perforated, erythematous, retracted or bulging.     Nose: Nose normal. No congestion.     Mouth/Throat:     Mouth: Mucous membranes are moist.     Pharynx: Uvula midline. No oropharyngeal exudate or posterior oropharyngeal erythema.  Eyes:     Extraocular Movements: Extraocular movements intact.     Pupils: Pupils are equal, round, and reactive to light.  Cardiovascular:     Rate and Rhythm: Normal rate and regular  rhythm.     Heart  sounds: Normal heart sounds.  Pulmonary:     Effort: Pulmonary effort is normal.     Breath sounds: Normal breath sounds. No decreased breath sounds, wheezing, rhonchi or rales.     Comments: Occ cough Abdominal:     Palpations: Abdomen is soft.     Tenderness: There is no abdominal tenderness. There is no guarding or rebound.  Neurological:     General: No focal deficit present.     Mental Status: She is alert and oriented to person, place, and time.  Psychiatric:        Mood and Affect: Mood normal.        Behavior: Behavior normal.        Thought Content: Thought content normal.        Judgment: Judgment normal.     UC Treatments / Results  Labs (all labs ordered are listed, but only abnormal results are displayed) Labs Reviewed  SARS CORONAVIRUS 2 (TAT 6-24 HRS)    EKG   Radiology No results found.  Procedures Procedures (including critical care time)  Medications Ordered in UC Medications - No data to display  Initial Impression / Assessment and Plan / UC Course  I have reviewed the triage vital signs and the nursing notes.  Pertinent labs & imaging results that were available during my care of the patient were reviewed by me and considered in my medical decision making (see chart for details).     This patient is a very pleasant 65 y.o. year old female presenting with acute exacerbation of chronic bronchitis. Today this pt is afebrile nontachycardic nontachypneic, oxygenating well on room air, no wheezes rhonchi or rales. Albuterol refilled, prednisone taper sent. ESRD, avoid NSAIDs. Covid PCR sent, is is vaccinated for covid. ED return precautions discussed. Patient verbalizes understanding and agreement.     Final Clinical Impressions(s) / UC Diagnoses   Final diagnoses:  Acute bronchitis, unspecified organism  Encounter for screening for COVID-19  ESRD on dialysis [facility])     Discharge Instructions      -Prednisone taper for cough/bronchitis. I  recommend taking this in the morning as it could give you energy.  Avoid NSAIDs like ibuprofen and alleve while taking this medication as they can increase your risk of stomach upset and even GI bleeding when in combination with a steroid. You can continue tylenol (acetaminophen) up to '1000mg'$  3x daily. -Albuterol inhaler -Continue nasal spray and allergy medications     ED Prescriptions     Medication Sig Dispense Auth. Provider   predniSONE (STERAPRED UNI-PAK 21 TAB) 10 MG (21) TBPK tablet Take by mouth daily. Take 6 tabs by mouth daily  for 2 days, then 5 tabs for 2 days, then 4 tabs for 2 days, then 3 tabs for 2 days, 2 tabs for 2 days, then 1 tab by mouth daily for 2 days 42 tablet Hazel Sams, PA-C   albuterol (VENTOLIN HFA) 108 (90 Base) MCG/ACT inhaler Inhale 1-2 puffs into the lungs every 6 (six) hours as needed for wheezing or shortness of breath. 1 each Hazel Sams, PA-C      PDMP not reviewed this encounter.   Hazel Sams, PA-C 05/01/21 [phone]

## 2021-05-01 NOTE — Discharge Instructions (Addendum)
-  Prednisone taper for cough/bronchitis. I recommend taking this in the morning as it could give you energy.  Avoid NSAIDs like ibuprofen and alleve while taking this medication as they can increase your risk of stomach upset and even GI bleeding when in combination with a steroid. You can continue tylenol (acetaminophen) up to '1000mg'$  3x daily. -Albuterol inhaler -Continue nasal spray and allergy medications

## 2021-05-04 DIAGNOSIS — D689 Coagulation defect, unspecified: Secondary | ICD-10-CM | POA: Diagnosis not present

## 2021-05-04 DIAGNOSIS — D509 Iron deficiency anemia, unspecified: Secondary | ICD-10-CM | POA: Diagnosis not present

## 2021-05-04 DIAGNOSIS — Z992 Dependence on renal dialysis: Secondary | ICD-10-CM | POA: Diagnosis not present

## 2021-05-04 DIAGNOSIS — N2581 Secondary hyperparathyroidism of renal origin: Secondary | ICD-10-CM | POA: Diagnosis not present

## 2021-05-04 DIAGNOSIS — N186 End stage renal disease: Secondary | ICD-10-CM | POA: Diagnosis not present

## 2021-05-06 DIAGNOSIS — N186 End stage renal disease: Secondary | ICD-10-CM | POA: Diagnosis not present

## 2021-05-06 DIAGNOSIS — Z992 Dependence on renal dialysis: Secondary | ICD-10-CM | POA: Diagnosis not present

## 2021-05-06 DIAGNOSIS — D689 Coagulation defect, unspecified: Secondary | ICD-10-CM | POA: Diagnosis not present

## 2021-05-06 DIAGNOSIS — D509 Iron deficiency anemia, unspecified: Secondary | ICD-10-CM | POA: Diagnosis not present

## 2021-05-06 DIAGNOSIS — N2581 Secondary hyperparathyroidism of renal origin: Secondary | ICD-10-CM | POA: Diagnosis not present

## 2021-05-08 DIAGNOSIS — D509 Iron deficiency anemia, unspecified: Secondary | ICD-10-CM | POA: Diagnosis not present

## 2021-05-08 DIAGNOSIS — D689 Coagulation defect, unspecified: Secondary | ICD-10-CM | POA: Diagnosis not present

## 2021-05-08 DIAGNOSIS — Z992 Dependence on renal dialysis: Secondary | ICD-10-CM | POA: Diagnosis not present

## 2021-05-08 DIAGNOSIS — N2581 Secondary hyperparathyroidism of renal origin: Secondary | ICD-10-CM | POA: Diagnosis not present

## 2021-05-08 DIAGNOSIS — N186 End stage renal disease: Secondary | ICD-10-CM | POA: Diagnosis not present

## 2021-05-11 DIAGNOSIS — N2581 Secondary hyperparathyroidism of renal origin: Secondary | ICD-10-CM | POA: Diagnosis not present

## 2021-05-11 DIAGNOSIS — D689 Coagulation defect, unspecified: Secondary | ICD-10-CM | POA: Diagnosis not present

## 2021-05-11 DIAGNOSIS — Z992 Dependence on renal dialysis: Secondary | ICD-10-CM | POA: Diagnosis not present

## 2021-05-11 DIAGNOSIS — D509 Iron deficiency anemia, unspecified: Secondary | ICD-10-CM | POA: Diagnosis not present

## 2021-05-11 DIAGNOSIS — N186 End stage renal disease: Secondary | ICD-10-CM | POA: Diagnosis not present

## 2021-05-13 DIAGNOSIS — N2581 Secondary hyperparathyroidism of renal origin: Secondary | ICD-10-CM | POA: Diagnosis not present

## 2021-05-13 DIAGNOSIS — Z992 Dependence on renal dialysis: Secondary | ICD-10-CM | POA: Diagnosis not present

## 2021-05-13 DIAGNOSIS — D509 Iron deficiency anemia, unspecified: Secondary | ICD-10-CM | POA: Diagnosis not present

## 2021-05-13 DIAGNOSIS — D689 Coagulation defect, unspecified: Secondary | ICD-10-CM | POA: Diagnosis not present

## 2021-05-13 DIAGNOSIS — N186 End stage renal disease: Secondary | ICD-10-CM | POA: Diagnosis not present

## 2021-05-15 DIAGNOSIS — D509 Iron deficiency anemia, unspecified: Secondary | ICD-10-CM | POA: Diagnosis not present

## 2021-05-15 DIAGNOSIS — D689 Coagulation defect, unspecified: Secondary | ICD-10-CM | POA: Diagnosis not present

## 2021-05-15 DIAGNOSIS — Z992 Dependence on renal dialysis: Secondary | ICD-10-CM | POA: Diagnosis not present

## 2021-05-15 DIAGNOSIS — N186 End stage renal disease: Secondary | ICD-10-CM | POA: Diagnosis not present

## 2021-05-15 DIAGNOSIS — N2581 Secondary hyperparathyroidism of renal origin: Secondary | ICD-10-CM | POA: Diagnosis not present

## 2021-05-18 DIAGNOSIS — Z992 Dependence on renal dialysis: Secondary | ICD-10-CM | POA: Diagnosis not present

## 2021-05-18 DIAGNOSIS — D509 Iron deficiency anemia, unspecified: Secondary | ICD-10-CM | POA: Diagnosis not present

## 2021-05-18 DIAGNOSIS — N186 End stage renal disease: Secondary | ICD-10-CM | POA: Diagnosis not present

## 2021-05-18 DIAGNOSIS — D689 Coagulation defect, unspecified: Secondary | ICD-10-CM | POA: Diagnosis not present

## 2021-05-18 DIAGNOSIS — N2581 Secondary hyperparathyroidism of renal origin: Secondary | ICD-10-CM | POA: Diagnosis not present

## 2021-05-20 DIAGNOSIS — N2581 Secondary hyperparathyroidism of renal origin: Secondary | ICD-10-CM | POA: Diagnosis not present

## 2021-05-20 DIAGNOSIS — D689 Coagulation defect, unspecified: Secondary | ICD-10-CM | POA: Diagnosis not present

## 2021-05-20 DIAGNOSIS — N186 End stage renal disease: Secondary | ICD-10-CM | POA: Diagnosis not present

## 2021-05-20 DIAGNOSIS — Z992 Dependence on renal dialysis: Secondary | ICD-10-CM | POA: Diagnosis not present

## 2021-05-20 DIAGNOSIS — D509 Iron deficiency anemia, unspecified: Secondary | ICD-10-CM | POA: Diagnosis not present

## 2021-05-21 DIAGNOSIS — N186 End stage renal disease: Secondary | ICD-10-CM | POA: Diagnosis not present

## 2021-05-21 DIAGNOSIS — Z992 Dependence on renal dialysis: Secondary | ICD-10-CM | POA: Diagnosis not present

## 2021-05-21 DIAGNOSIS — I120 Hypertensive chronic kidney disease with stage 5 chronic kidney disease or end stage renal disease: Secondary | ICD-10-CM | POA: Diagnosis not present

## 2021-05-22 DIAGNOSIS — Z992 Dependence on renal dialysis: Secondary | ICD-10-CM | POA: Diagnosis not present

## 2021-05-22 DIAGNOSIS — N186 End stage renal disease: Secondary | ICD-10-CM | POA: Diagnosis not present

## 2021-05-22 DIAGNOSIS — N2581 Secondary hyperparathyroidism of renal origin: Secondary | ICD-10-CM | POA: Diagnosis not present

## 2021-05-22 DIAGNOSIS — D689 Coagulation defect, unspecified: Secondary | ICD-10-CM | POA: Diagnosis not present

## 2021-05-22 DIAGNOSIS — D688 Other specified coagulation defects: Secondary | ICD-10-CM | POA: Diagnosis not present

## 2021-05-25 DIAGNOSIS — D689 Coagulation defect, unspecified: Secondary | ICD-10-CM | POA: Diagnosis not present

## 2021-05-25 DIAGNOSIS — Z992 Dependence on renal dialysis: Secondary | ICD-10-CM | POA: Diagnosis not present

## 2021-05-25 DIAGNOSIS — D688 Other specified coagulation defects: Secondary | ICD-10-CM | POA: Diagnosis not present

## 2021-05-25 DIAGNOSIS — N2581 Secondary hyperparathyroidism of renal origin: Secondary | ICD-10-CM | POA: Diagnosis not present

## 2021-05-25 DIAGNOSIS — N186 End stage renal disease: Secondary | ICD-10-CM | POA: Diagnosis not present

## 2021-05-27 DIAGNOSIS — D688 Other specified coagulation defects: Secondary | ICD-10-CM | POA: Diagnosis not present

## 2021-05-27 DIAGNOSIS — Z992 Dependence on renal dialysis: Secondary | ICD-10-CM | POA: Diagnosis not present

## 2021-05-27 DIAGNOSIS — N2581 Secondary hyperparathyroidism of renal origin: Secondary | ICD-10-CM | POA: Diagnosis not present

## 2021-05-27 DIAGNOSIS — D689 Coagulation defect, unspecified: Secondary | ICD-10-CM | POA: Diagnosis not present

## 2021-05-27 DIAGNOSIS — N186 End stage renal disease: Secondary | ICD-10-CM | POA: Diagnosis not present

## 2021-05-29 DIAGNOSIS — D688 Other specified coagulation defects: Secondary | ICD-10-CM | POA: Diagnosis not present

## 2021-05-29 DIAGNOSIS — D689 Coagulation defect, unspecified: Secondary | ICD-10-CM | POA: Diagnosis not present

## 2021-05-29 DIAGNOSIS — Z992 Dependence on renal dialysis: Secondary | ICD-10-CM | POA: Diagnosis not present

## 2021-05-29 DIAGNOSIS — N186 End stage renal disease: Secondary | ICD-10-CM | POA: Diagnosis not present

## 2021-05-29 DIAGNOSIS — N2581 Secondary hyperparathyroidism of renal origin: Secondary | ICD-10-CM | POA: Diagnosis not present

## 2021-06-01 DIAGNOSIS — D689 Coagulation defect, unspecified: Secondary | ICD-10-CM | POA: Diagnosis not present

## 2021-06-01 DIAGNOSIS — D688 Other specified coagulation defects: Secondary | ICD-10-CM | POA: Diagnosis not present

## 2021-06-01 DIAGNOSIS — Z992 Dependence on renal dialysis: Secondary | ICD-10-CM | POA: Diagnosis not present

## 2021-06-01 DIAGNOSIS — N2581 Secondary hyperparathyroidism of renal origin: Secondary | ICD-10-CM | POA: Diagnosis not present

## 2021-06-01 DIAGNOSIS — N186 End stage renal disease: Secondary | ICD-10-CM | POA: Diagnosis not present

## 2021-06-02 ENCOUNTER — Ambulatory Visit (INDEPENDENT_AMBULATORY_CARE_PROVIDER_SITE_OTHER): Payer: Medicare Other | Admitting: Nurse Practitioner

## 2021-06-02 ENCOUNTER — Encounter: Payer: Self-pay | Admitting: Nurse Practitioner

## 2021-06-02 ENCOUNTER — Other Ambulatory Visit: Payer: Self-pay

## 2021-06-02 VITALS — BP 104/82 | HR 77 | Temp 97.8°F | Ht 63.5 in | Wt 156.6 lb

## 2021-06-02 DIAGNOSIS — I700 Atherosclerosis of aorta: Secondary | ICD-10-CM | POA: Diagnosis not present

## 2021-06-02 DIAGNOSIS — Z23 Encounter for immunization: Secondary | ICD-10-CM | POA: Diagnosis not present

## 2021-06-02 DIAGNOSIS — E782 Mixed hyperlipidemia: Secondary | ICD-10-CM

## 2021-06-02 DIAGNOSIS — Z7189 Other specified counseling: Secondary | ICD-10-CM | POA: Diagnosis not present

## 2021-06-02 DIAGNOSIS — E041 Nontoxic single thyroid nodule: Secondary | ICD-10-CM | POA: Diagnosis not present

## 2021-06-02 MED ORDER — ROSUVASTATIN CALCIUM 10 MG PO TABS: 10.0000 mg | ORAL_TABLET | Freq: Every day | ORAL | 3 refills | Status: DC

## 2021-06-02 NOTE — Assessment & Plan Note (Signed)
Took crestor for 50monthwithout any adverse side effects.  [facility]  Advise to resume medication and return to lab in 83m383monthor repeat lipid panel.

## 2021-06-02 NOTE — Assessment & Plan Note (Signed)
Asymptomatic Repeat TSH

## 2021-06-02 NOTE — Patient Instructions (Addendum)
Schedule lab appt in 44month. You will need to be fasting at least 6-8hrs prior to blood draw. Ok to drink water.  Complete Advanced directive form. Print copy to next office visit.  Advance Directive Advance directives are legal documents that allow you to make decisions about your health care and medical treatment in case you become unable to communicate for yourself. Advance directives let your wishes be known to family, friends, and health care providers. Discussing and writing advance directives should happen over time rather than all at once. Advance directives can be changed and updated at any time. There are different types of advance directives, such as: Medical power of attorney. Living will. Do not resuscitate (DNR) order or do not attempt resuscitation (DNAR) order. Health care proxy and medical power of attorney A health care proxy is also called a health care agent. This person is appointed to make medical decisions for you when you are unable to make decisions for yourself. Generally, people ask a trusted friend or family member to act as their proxy and represent their preferences. Make sure you have an agreement with your trusted person to act as your proxy. A proxy may have to make a medical decision on your behalf if your wishes are not known. A medical power of attorney, also called a durable power of attorney for health care, is a legal document that names your health care proxy. Depending on the laws in your state, the document may need to be: Signed. Notarized. Dated. Copied. Witnessed. Incorporated into your medical record. You may also want to appoint a trusted person to manage your money in the event you are unable to do so. This is called a durable power of attorney for finances. It is a separate legal document from the durable power of attorney for health care. You may choose your health care proxy or someone different to act as your agent in money matters. If you do  not appoint a proxy, or there is a concern that the proxy is not acting in your best interest, a court may appoint a guardian to act on your behalf. Living will A living will is a set of instructions that state your wishes about medical care when you cannot express them yourself. Health care providers should keep a copy of your living will in your medical record. You may want to give a copy to family members or friends. To alert caregivers in case of an emergency, you can place a card in your wallet to let them know that you have a living will and where they can find it. A living will is used if you become: Terminally ill. Disabled. Unable to communicate or make decisions. The following decisions should be included in your living will: To use or not to use life support equipment, such as dialysis machines and breathing machines (ventilators). Whether you want a DNR or DNAR order. This tells health care providers not to use cardiopulmonary resuscitation (CPR) if breathing or heartbeat stops. To use or not to use tube feeding. To be given or not to be given food and fluids. Whether you want comfort (palliative) care when the goal becomes comfort rather than a cure. Whether you want to donate your organs and tissues. A living will does not give instructions for distributing your money and property if you should pass away. DNR or DNAR A DNR or DNAR order is a request not to have CPR in the event that your heart stops beating or you  stop breathing. If a DNR or DNAR order has not been made and shared, a health care provider will try to help any patient whose heart has stopped or who has stopped breathing. If you plan to have surgery, talk with your health care provider about how your DNR or DNAR order will be followed if problems occur. What if I do not have an advance directive? Some states assign family decision makers to act on your behalf if you do not have an advance directive. Each state has its own  laws about advance directives. You may want to check with your health care provider, attorney, or state representative about the laws in your state. Summary Advance directives are legal documents that allow you to make decisions about your health care and medical treatment in case you become unable to communicate for yourself. The process of discussing and writing advance directives should happen over time. You can change and update advance directives at any time. Advance directives may include a medical power of attorney, a living will, and a DNR or DNAR order. This information is not intended to replace advice given to you by your health care provider. Make sure you discuss any questions you have with your health care provider. Document Revised: 06/10/2020 Document Reviewed: 06/10/2020 Elsevier Patient Education  2022 [employer].

## 2021-06-02 NOTE — Progress Notes (Signed)
Subjective:  Patient ID: Victoria [name], female    DOB: 11/18/1955  Age: 65 y.o. MRN: [id number]  CC: Follow-up (3 month f/u on HTN and Cholesterol. /Pt is not fasting. (Fruit snacks at 6 am))  HPI  Thyroid nodule Asymptomatic Repeat TSH  Hyperlipidemia Took crestor for 77monthwithout any adverse side effects.  [facility]  Advise to resume medication and return to lab in 34m68monthor repeat lipid panel.  BP Readings from Last 3 Encounters:  06/02/21 104/82  05/01/21 128/77  02/23/21 124/66    Reviewed past Medical, Social and Family history today.  Outpatient Medications Prior to Visit  Medication Sig Dispense Refill   albuterol (VENTOLIN HFA) 108 (90 Base) MCG/ACT inhaler Inhale 1-2 puffs into the lungs every 6 (six) hours as needed for wheezing or shortness of breath. 1 each 0   calcium acetate (PHOSLO) 667 MG capsule Take 2,001 mg by mouth 3 (three) times daily.      fluticasone (FLONASE) 50 MCG/ACT nasal spray Place 2 sprays into both nostrils daily as needed.      rosuvastatin (CRESTOR) 10 MG tablet Take 1 tablet (10 mg total) by mouth daily. 90 tablet 3   predniSONE (STERAPRED UNI-PAK 21 TAB) 10 MG (21) TBPK tablet Take by mouth daily. Take 6 tabs by mouth daily  for 2 days, then 5 tabs for 2 days, then 4 tabs for 2 days, then 3 tabs for 2 days, 2 tabs for 2 days, then 1 tab by mouth daily for 2 days (Patient not taking: Reported on 06/02/2021) 42 tablet 0   No facility-administered medications prior to visit.    ROS See HPI  Objective:  BP 104/82 (BP Location: Left Arm, Patient Position: Sitting, Cuff Size: Normal)   Pulse 77   Temp 97.8 F (36.6 C) (Temporal)   Ht 5' 3.5" (1.613 m)   Wt 156 lb 9.6 oz (71 kg)   LMP  (LMP Unknown)   SpO2 100%   BMI 27.31 kg/m   Physical Exam Neck:     Thyroid: No thyroid mass, thyromegaly or thyroid tenderness.  Cardiovascular:     Rate and Rhythm: Normal rate  and regular rhythm.     Pulses: Normal pulses.     Heart sounds: Normal heart sounds.  Pulmonary:     Effort: Pulmonary effort is normal.     Breath sounds: Normal breath sounds.  Musculoskeletal:     Cervical back: Normal range of motion and neck supple.     Right lower leg: No edema.     Left lower leg: No edema.  Lymphadenopathy:     Cervical: No cervical adenopathy.  Neurological:     Mental Status: She is alert and oriented to person, place, and time.    Assessment & Plan:  This visit occurred during the SARS-CoV-2 public health emergency.  Safety protocols were in place, including screening questions prior to the visit, additional usage of staff PPE, and extensive cleaning of exam room while observing appropriate contact time as indicated for disinfecting solutions.   DonKentrells seen today for follow-up.  Diagnoses and all orders for this visit:  Mixed hyperlipidemia -     Cancel: Lipid panel -     Cancel: Comprehensive metabolic panel -     rosuvastatin (CRESTOR) 10 MG tablet; Take 1 tablet (10 mg total) by mouth daily. -     Hepatic function panel; Future -  Lipid panel; Future  Aortic atherosclerosis (HCC) -     Cancel: Lipid panel -     Cancel: Comprehensive metabolic panel -     rosuvastatin (CRESTOR) 10 MG tablet; Take 1 tablet (10 mg total) by mouth daily. -     Hepatic function panel; Future -     Lipid panel; Future  Thyroid nodule -     Cancel: TSH -     TSH; Future  Need for influenza vaccination -     Flu Vaccine QUAD High Dose(Fluad)  Advanced directives, counseling/discussion Provided a copy of MOST form to review and complete. Also advised to complete an HCPOA form. She verbalized understanding.  Problem List Items Addressed This Visit       Cardiovascular and Mediastinum   Aortic atherosclerosis ([place])   Relevant Medications   rosuvastatin (CRESTOR) 10 MG tablet   Other Relevant Orders   Hepatic function panel   Lipid panel      Endocrine   Thyroid nodule    Asymptomatic Repeat TSH      Relevant Orders   TSH     Other   Hyperlipidemia - Primary    Took crestor for 76monthwithout any adverse side effects.  [facility].  Advise to resume medication and return to lab in 93m68monthor repeat lipid panel.      Relevant Medications   rosuvastatin (CRESTOR) 10 MG tablet   Other Relevant Orders   Hepatic function panel   Lipid panel   Other Visit Diagnoses     Need for influenza vaccination       Relevant Orders   Flu Vaccine QUAD High Dose(Fluad)   Advanced directives, counseling/discussion           Follow-up: Return in about 6 months (around 11/30/2021) for HTN and , hyperlipidemia (fasting).  ChaWilfred LacyP

## 2021-06-03 ENCOUNTER — Ambulatory Visit
Admission: RE | Admit: 2021-06-03 | Discharge: 2021-06-03 | Disposition: A | Discharge status: Home / Self Care | Payer: Medicare Other | Source: Ambulatory Visit | Attending: Nephrology | Admitting: Nephrology

## 2021-06-03 ENCOUNTER — Ambulatory Visit: Payer: Medicare Other

## 2021-06-03 DIAGNOSIS — Z1231 Encounter for screening mammogram for malignant neoplasm of breast: Secondary | ICD-10-CM

## 2021-06-03 DIAGNOSIS — D689 Coagulation defect, unspecified: Secondary | ICD-10-CM | POA: Diagnosis not present

## 2021-06-03 DIAGNOSIS — D688 Other specified coagulation defects: Secondary | ICD-10-CM | POA: Diagnosis not present

## 2021-06-03 DIAGNOSIS — N186 End stage renal disease: Secondary | ICD-10-CM | POA: Diagnosis not present

## 2021-06-03 DIAGNOSIS — Z992 Dependence on renal dialysis: Secondary | ICD-10-CM | POA: Diagnosis not present

## 2021-06-03 DIAGNOSIS — N2581 Secondary hyperparathyroidism of renal origin: Secondary | ICD-10-CM | POA: Diagnosis not present

## 2021-06-05 DIAGNOSIS — D688 Other specified coagulation defects: Secondary | ICD-10-CM | POA: Diagnosis not present

## 2021-06-05 DIAGNOSIS — N186 End stage renal disease: Secondary | ICD-10-CM | POA: Diagnosis not present

## 2021-06-05 DIAGNOSIS — Z992 Dependence on renal dialysis: Secondary | ICD-10-CM | POA: Diagnosis not present

## 2021-06-05 DIAGNOSIS — N2581 Secondary hyperparathyroidism of renal origin: Secondary | ICD-10-CM | POA: Diagnosis not present

## 2021-06-05 DIAGNOSIS — D689 Coagulation defect, unspecified: Secondary | ICD-10-CM | POA: Diagnosis not present

## 2021-06-08 DIAGNOSIS — N186 End stage renal disease: Secondary | ICD-10-CM | POA: Diagnosis not present

## 2021-06-08 DIAGNOSIS — D689 Coagulation defect, unspecified: Secondary | ICD-10-CM | POA: Diagnosis not present

## 2021-06-08 DIAGNOSIS — Z992 Dependence on renal dialysis: Secondary | ICD-10-CM | POA: Diagnosis not present

## 2021-06-08 DIAGNOSIS — D688 Other specified coagulation defects: Secondary | ICD-10-CM | POA: Diagnosis not present

## 2021-06-08 DIAGNOSIS — N2581 Secondary hyperparathyroidism of renal origin: Secondary | ICD-10-CM | POA: Diagnosis not present

## 2021-06-10 DIAGNOSIS — N2581 Secondary hyperparathyroidism of renal origin: Secondary | ICD-10-CM | POA: Diagnosis not present

## 2021-06-10 DIAGNOSIS — N186 End stage renal disease: Secondary | ICD-10-CM | POA: Diagnosis not present

## 2021-06-10 DIAGNOSIS — D689 Coagulation defect, unspecified: Secondary | ICD-10-CM | POA: Diagnosis not present

## 2021-06-10 DIAGNOSIS — D688 Other specified coagulation defects: Secondary | ICD-10-CM | POA: Diagnosis not present

## 2021-06-10 DIAGNOSIS — Z992 Dependence on renal dialysis: Secondary | ICD-10-CM | POA: Diagnosis not present

## 2021-06-12 DIAGNOSIS — D689 Coagulation defect, unspecified: Secondary | ICD-10-CM | POA: Diagnosis not present

## 2021-06-12 DIAGNOSIS — N186 End stage renal disease: Secondary | ICD-10-CM | POA: Diagnosis not present

## 2021-06-12 DIAGNOSIS — N2581 Secondary hyperparathyroidism of renal origin: Secondary | ICD-10-CM | POA: Diagnosis not present

## 2021-06-12 DIAGNOSIS — Z992 Dependence on renal dialysis: Secondary | ICD-10-CM | POA: Diagnosis not present

## 2021-06-12 DIAGNOSIS — D688 Other specified coagulation defects: Secondary | ICD-10-CM | POA: Diagnosis not present

## 2021-06-15 DIAGNOSIS — D689 Coagulation defect, unspecified: Secondary | ICD-10-CM | POA: Diagnosis not present

## 2021-06-15 DIAGNOSIS — Z992 Dependence on renal dialysis: Secondary | ICD-10-CM | POA: Diagnosis not present

## 2021-06-15 DIAGNOSIS — N2581 Secondary hyperparathyroidism of renal origin: Secondary | ICD-10-CM | POA: Diagnosis not present

## 2021-06-15 DIAGNOSIS — Z7689 Persons encountering health services in other specified circumstances: Secondary | ICD-10-CM | POA: Diagnosis not present

## 2021-06-15 DIAGNOSIS — N186 End stage renal disease: Secondary | ICD-10-CM | POA: Diagnosis not present

## 2021-06-15 DIAGNOSIS — D688 Other specified coagulation defects: Secondary | ICD-10-CM | POA: Diagnosis not present

## 2021-06-17 DIAGNOSIS — Z7689 Persons encountering health services in other specified circumstances: Secondary | ICD-10-CM | POA: Diagnosis not present

## 2021-06-17 DIAGNOSIS — D689 Coagulation defect, unspecified: Secondary | ICD-10-CM | POA: Diagnosis not present

## 2021-06-17 DIAGNOSIS — Z992 Dependence on renal dialysis: Secondary | ICD-10-CM | POA: Diagnosis not present

## 2021-06-17 DIAGNOSIS — N186 End stage renal disease: Secondary | ICD-10-CM | POA: Diagnosis not present

## 2021-06-17 DIAGNOSIS — D688 Other specified coagulation defects: Secondary | ICD-10-CM | POA: Diagnosis not present

## 2021-06-17 DIAGNOSIS — N2581 Secondary hyperparathyroidism of renal origin: Secondary | ICD-10-CM | POA: Diagnosis not present

## 2021-06-19 DIAGNOSIS — N2581 Secondary hyperparathyroidism of renal origin: Secondary | ICD-10-CM | POA: Diagnosis not present

## 2021-06-19 DIAGNOSIS — D689 Coagulation defect, unspecified: Secondary | ICD-10-CM | POA: Diagnosis not present

## 2021-06-19 DIAGNOSIS — D688 Other specified coagulation defects: Secondary | ICD-10-CM | POA: Diagnosis not present

## 2021-06-19 DIAGNOSIS — N186 End stage renal disease: Secondary | ICD-10-CM | POA: Diagnosis not present

## 2021-06-19 DIAGNOSIS — Z7689 Persons encountering health services in other specified circumstances: Secondary | ICD-10-CM | POA: Diagnosis not present

## 2021-06-19 DIAGNOSIS — Z992 Dependence on renal dialysis: Secondary | ICD-10-CM | POA: Diagnosis not present

## 2021-06-20 DIAGNOSIS — N186 End stage renal disease: Secondary | ICD-10-CM | POA: Diagnosis not present

## 2021-06-20 DIAGNOSIS — Z992 Dependence on renal dialysis: Secondary | ICD-10-CM | POA: Diagnosis not present

## 2021-06-20 DIAGNOSIS — I120 Hypertensive chronic kidney disease with stage 5 chronic kidney disease or end stage renal disease: Secondary | ICD-10-CM | POA: Diagnosis not present

## 2021-06-22 DIAGNOSIS — N186 End stage renal disease: Secondary | ICD-10-CM | POA: Diagnosis not present

## 2021-06-22 DIAGNOSIS — Z992 Dependence on renal dialysis: Secondary | ICD-10-CM | POA: Diagnosis not present

## 2021-06-22 DIAGNOSIS — D688 Other specified coagulation defects: Secondary | ICD-10-CM | POA: Diagnosis not present

## 2021-06-22 DIAGNOSIS — Z7689 Persons encountering health services in other specified circumstances: Secondary | ICD-10-CM | POA: Diagnosis not present

## 2021-06-22 DIAGNOSIS — N2581 Secondary hyperparathyroidism of renal origin: Secondary | ICD-10-CM | POA: Diagnosis not present

## 2021-06-24 DIAGNOSIS — N2581 Secondary hyperparathyroidism of renal origin: Secondary | ICD-10-CM | POA: Diagnosis not present

## 2021-06-24 DIAGNOSIS — D688 Other specified coagulation defects: Secondary | ICD-10-CM | POA: Diagnosis not present

## 2021-06-24 DIAGNOSIS — Z992 Dependence on renal dialysis: Secondary | ICD-10-CM | POA: Diagnosis not present

## 2021-06-24 DIAGNOSIS — N186 End stage renal disease: Secondary | ICD-10-CM | POA: Diagnosis not present

## 2021-06-24 DIAGNOSIS — Z7689 Persons encountering health services in other specified circumstances: Secondary | ICD-10-CM | POA: Diagnosis not present

## 2021-06-26 DIAGNOSIS — Z992 Dependence on renal dialysis: Secondary | ICD-10-CM | POA: Diagnosis not present

## 2021-06-26 DIAGNOSIS — D688 Other specified coagulation defects: Secondary | ICD-10-CM | POA: Diagnosis not present

## 2021-06-26 DIAGNOSIS — Z7689 Persons encountering health services in other specified circumstances: Secondary | ICD-10-CM | POA: Diagnosis not present

## 2021-06-26 DIAGNOSIS — N186 End stage renal disease: Secondary | ICD-10-CM | POA: Diagnosis not present

## 2021-06-26 DIAGNOSIS — N2581 Secondary hyperparathyroidism of renal origin: Secondary | ICD-10-CM | POA: Diagnosis not present

## 2021-06-29 DIAGNOSIS — N2581 Secondary hyperparathyroidism of renal origin: Secondary | ICD-10-CM | POA: Diagnosis not present

## 2021-06-29 DIAGNOSIS — Z7689 Persons encountering health services in other specified circumstances: Secondary | ICD-10-CM | POA: Diagnosis not present

## 2021-06-29 DIAGNOSIS — D688 Other specified coagulation defects: Secondary | ICD-10-CM | POA: Diagnosis not present

## 2021-06-29 DIAGNOSIS — Z992 Dependence on renal dialysis: Secondary | ICD-10-CM | POA: Diagnosis not present

## 2021-06-29 DIAGNOSIS — N186 End stage renal disease: Secondary | ICD-10-CM | POA: Diagnosis not present

## 2021-07-01 DIAGNOSIS — D688 Other specified coagulation defects: Secondary | ICD-10-CM | POA: Diagnosis not present

## 2021-07-01 DIAGNOSIS — Z7689 Persons encountering health services in other specified circumstances: Secondary | ICD-10-CM | POA: Diagnosis not present

## 2021-07-01 DIAGNOSIS — Z992 Dependence on renal dialysis: Secondary | ICD-10-CM | POA: Diagnosis not present

## 2021-07-01 DIAGNOSIS — N2581 Secondary hyperparathyroidism of renal origin: Secondary | ICD-10-CM | POA: Diagnosis not present

## 2021-07-01 DIAGNOSIS — N186 End stage renal disease: Secondary | ICD-10-CM | POA: Diagnosis not present

## 2021-07-03 DIAGNOSIS — N2581 Secondary hyperparathyroidism of renal origin: Secondary | ICD-10-CM | POA: Diagnosis not present

## 2021-07-03 DIAGNOSIS — N186 End stage renal disease: Secondary | ICD-10-CM | POA: Diagnosis not present

## 2021-07-03 DIAGNOSIS — Z992 Dependence on renal dialysis: Secondary | ICD-10-CM | POA: Diagnosis not present

## 2021-07-03 DIAGNOSIS — Z7689 Persons encountering health services in other specified circumstances: Secondary | ICD-10-CM | POA: Diagnosis not present

## 2021-07-03 DIAGNOSIS — D688 Other specified coagulation defects: Secondary | ICD-10-CM | POA: Diagnosis not present

## 2021-07-06 DIAGNOSIS — N2581 Secondary hyperparathyroidism of renal origin: Secondary | ICD-10-CM | POA: Diagnosis not present

## 2021-07-06 DIAGNOSIS — Z992 Dependence on renal dialysis: Secondary | ICD-10-CM | POA: Diagnosis not present

## 2021-07-06 DIAGNOSIS — D688 Other specified coagulation defects: Secondary | ICD-10-CM | POA: Diagnosis not present

## 2021-07-06 DIAGNOSIS — N186 End stage renal disease: Secondary | ICD-10-CM | POA: Diagnosis not present

## 2021-07-06 DIAGNOSIS — Z7689 Persons encountering health services in other specified circumstances: Secondary | ICD-10-CM | POA: Diagnosis not present

## 2021-07-08 DIAGNOSIS — D688 Other specified coagulation defects: Secondary | ICD-10-CM | POA: Diagnosis not present

## 2021-07-08 DIAGNOSIS — N186 End stage renal disease: Secondary | ICD-10-CM | POA: Diagnosis not present

## 2021-07-08 DIAGNOSIS — Z7689 Persons encountering health services in other specified circumstances: Secondary | ICD-10-CM | POA: Diagnosis not present

## 2021-07-08 DIAGNOSIS — Z992 Dependence on renal dialysis: Secondary | ICD-10-CM | POA: Diagnosis not present

## 2021-07-08 DIAGNOSIS — N2581 Secondary hyperparathyroidism of renal origin: Secondary | ICD-10-CM | POA: Diagnosis not present

## 2021-07-10 DIAGNOSIS — D688 Other specified coagulation defects: Secondary | ICD-10-CM | POA: Diagnosis not present

## 2021-07-10 DIAGNOSIS — Z992 Dependence on renal dialysis: Secondary | ICD-10-CM | POA: Diagnosis not present

## 2021-07-10 DIAGNOSIS — N2581 Secondary hyperparathyroidism of renal origin: Secondary | ICD-10-CM | POA: Diagnosis not present

## 2021-07-10 DIAGNOSIS — N186 End stage renal disease: Secondary | ICD-10-CM | POA: Diagnosis not present

## 2021-07-10 DIAGNOSIS — Z7689 Persons encountering health services in other specified circumstances: Secondary | ICD-10-CM | POA: Diagnosis not present

## 2021-07-13 DIAGNOSIS — Z7689 Persons encountering health services in other specified circumstances: Secondary | ICD-10-CM | POA: Diagnosis not present

## 2021-07-13 DIAGNOSIS — Z992 Dependence on renal dialysis: Secondary | ICD-10-CM | POA: Diagnosis not present

## 2021-07-13 DIAGNOSIS — D688 Other specified coagulation defects: Secondary | ICD-10-CM | POA: Diagnosis not present

## 2021-07-13 DIAGNOSIS — N186 End stage renal disease: Secondary | ICD-10-CM | POA: Diagnosis not present

## 2021-07-13 DIAGNOSIS — N2581 Secondary hyperparathyroidism of renal origin: Secondary | ICD-10-CM | POA: Diagnosis not present

## 2021-07-15 DIAGNOSIS — Z992 Dependence on renal dialysis: Secondary | ICD-10-CM | POA: Diagnosis not present

## 2021-07-15 DIAGNOSIS — Z7689 Persons encountering health services in other specified circumstances: Secondary | ICD-10-CM | POA: Diagnosis not present

## 2021-07-15 DIAGNOSIS — N186 End stage renal disease: Secondary | ICD-10-CM | POA: Diagnosis not present

## 2021-07-15 DIAGNOSIS — D688 Other specified coagulation defects: Secondary | ICD-10-CM | POA: Diagnosis not present

## 2021-07-15 DIAGNOSIS — N2581 Secondary hyperparathyroidism of renal origin: Secondary | ICD-10-CM | POA: Diagnosis not present

## 2021-07-17 DIAGNOSIS — Z992 Dependence on renal dialysis: Secondary | ICD-10-CM | POA: Diagnosis not present

## 2021-07-17 DIAGNOSIS — D688 Other specified coagulation defects: Secondary | ICD-10-CM | POA: Diagnosis not present

## 2021-07-17 DIAGNOSIS — Z7689 Persons encountering health services in other specified circumstances: Secondary | ICD-10-CM | POA: Diagnosis not present

## 2021-07-17 DIAGNOSIS — N2581 Secondary hyperparathyroidism of renal origin: Secondary | ICD-10-CM | POA: Diagnosis not present

## 2021-07-17 DIAGNOSIS — N186 End stage renal disease: Secondary | ICD-10-CM | POA: Diagnosis not present

## 2021-07-20 DIAGNOSIS — N186 End stage renal disease: Secondary | ICD-10-CM | POA: Diagnosis not present

## 2021-07-20 DIAGNOSIS — Z992 Dependence on renal dialysis: Secondary | ICD-10-CM | POA: Diagnosis not present

## 2021-07-20 DIAGNOSIS — D688 Other specified coagulation defects: Secondary | ICD-10-CM | POA: Diagnosis not present

## 2021-07-20 DIAGNOSIS — N2581 Secondary hyperparathyroidism of renal origin: Secondary | ICD-10-CM | POA: Diagnosis not present

## 2021-07-20 DIAGNOSIS — Z7689 Persons encountering health services in other specified circumstances: Secondary | ICD-10-CM | POA: Diagnosis not present

## 2021-07-21 DIAGNOSIS — I120 Hypertensive chronic kidney disease with stage 5 chronic kidney disease or end stage renal disease: Secondary | ICD-10-CM | POA: Diagnosis not present

## 2021-07-21 DIAGNOSIS — Z992 Dependence on renal dialysis: Secondary | ICD-10-CM | POA: Diagnosis not present

## 2021-07-21 DIAGNOSIS — N186 End stage renal disease: Secondary | ICD-10-CM | POA: Diagnosis not present

## 2021-07-22 DIAGNOSIS — Z7689 Persons encountering health services in other specified circumstances: Secondary | ICD-10-CM | POA: Diagnosis not present

## 2021-07-22 DIAGNOSIS — Z992 Dependence on renal dialysis: Secondary | ICD-10-CM | POA: Diagnosis not present

## 2021-07-22 DIAGNOSIS — N2581 Secondary hyperparathyroidism of renal origin: Secondary | ICD-10-CM | POA: Diagnosis not present

## 2021-07-22 DIAGNOSIS — D688 Other specified coagulation defects: Secondary | ICD-10-CM | POA: Diagnosis not present

## 2021-07-22 DIAGNOSIS — N186 End stage renal disease: Secondary | ICD-10-CM | POA: Diagnosis not present

## 2021-07-24 DIAGNOSIS — Z992 Dependence on renal dialysis: Secondary | ICD-10-CM | POA: Diagnosis not present

## 2021-07-24 DIAGNOSIS — N2581 Secondary hyperparathyroidism of renal origin: Secondary | ICD-10-CM | POA: Diagnosis not present

## 2021-07-24 DIAGNOSIS — D688 Other specified coagulation defects: Secondary | ICD-10-CM | POA: Diagnosis not present

## 2021-07-24 DIAGNOSIS — N186 End stage renal disease: Secondary | ICD-10-CM | POA: Diagnosis not present

## 2021-07-24 DIAGNOSIS — Z7689 Persons encountering health services in other specified circumstances: Secondary | ICD-10-CM | POA: Diagnosis not present

## 2021-07-27 DIAGNOSIS — D688 Other specified coagulation defects: Secondary | ICD-10-CM | POA: Diagnosis not present

## 2021-07-27 DIAGNOSIS — N186 End stage renal disease: Secondary | ICD-10-CM | POA: Diagnosis not present

## 2021-07-27 DIAGNOSIS — Z992 Dependence on renal dialysis: Secondary | ICD-10-CM | POA: Diagnosis not present

## 2021-07-27 DIAGNOSIS — N2581 Secondary hyperparathyroidism of renal origin: Secondary | ICD-10-CM | POA: Diagnosis not present

## 2021-07-27 DIAGNOSIS — Z7689 Persons encountering health services in other specified circumstances: Secondary | ICD-10-CM | POA: Diagnosis not present

## 2021-07-29 DIAGNOSIS — Z7689 Persons encountering health services in other specified circumstances: Secondary | ICD-10-CM | POA: Diagnosis not present

## 2021-07-29 DIAGNOSIS — N2581 Secondary hyperparathyroidism of renal origin: Secondary | ICD-10-CM | POA: Diagnosis not present

## 2021-07-29 DIAGNOSIS — D688 Other specified coagulation defects: Secondary | ICD-10-CM | POA: Diagnosis not present

## 2021-07-29 DIAGNOSIS — Z992 Dependence on renal dialysis: Secondary | ICD-10-CM | POA: Diagnosis not present

## 2021-07-29 DIAGNOSIS — N186 End stage renal disease: Secondary | ICD-10-CM | POA: Diagnosis not present

## 2021-07-31 DIAGNOSIS — N186 End stage renal disease: Secondary | ICD-10-CM | POA: Diagnosis not present

## 2021-07-31 DIAGNOSIS — Z992 Dependence on renal dialysis: Secondary | ICD-10-CM | POA: Diagnosis not present

## 2021-07-31 DIAGNOSIS — Z7689 Persons encountering health services in other specified circumstances: Secondary | ICD-10-CM | POA: Diagnosis not present

## 2021-07-31 DIAGNOSIS — D688 Other specified coagulation defects: Secondary | ICD-10-CM | POA: Diagnosis not present

## 2021-07-31 DIAGNOSIS — N2581 Secondary hyperparathyroidism of renal origin: Secondary | ICD-10-CM | POA: Diagnosis not present

## 2021-08-03 DIAGNOSIS — Z7689 Persons encountering health services in other specified circumstances: Secondary | ICD-10-CM | POA: Diagnosis not present

## 2021-08-03 DIAGNOSIS — Z992 Dependence on renal dialysis: Secondary | ICD-10-CM | POA: Diagnosis not present

## 2021-08-03 DIAGNOSIS — N186 End stage renal disease: Secondary | ICD-10-CM | POA: Diagnosis not present

## 2021-08-03 DIAGNOSIS — D688 Other specified coagulation defects: Secondary | ICD-10-CM | POA: Diagnosis not present

## 2021-08-03 DIAGNOSIS — N2581 Secondary hyperparathyroidism of renal origin: Secondary | ICD-10-CM | POA: Diagnosis not present

## 2021-08-05 DIAGNOSIS — Z7689 Persons encountering health services in other specified circumstances: Secondary | ICD-10-CM | POA: Diagnosis not present

## 2021-08-05 DIAGNOSIS — N186 End stage renal disease: Secondary | ICD-10-CM | POA: Diagnosis not present

## 2021-08-05 DIAGNOSIS — Z992 Dependence on renal dialysis: Secondary | ICD-10-CM | POA: Diagnosis not present

## 2021-08-05 DIAGNOSIS — N2581 Secondary hyperparathyroidism of renal origin: Secondary | ICD-10-CM | POA: Diagnosis not present

## 2021-08-05 DIAGNOSIS — D688 Other specified coagulation defects: Secondary | ICD-10-CM | POA: Diagnosis not present

## 2021-08-07 ENCOUNTER — Telehealth: Payer: Self-pay | Admitting: Nurse Practitioner

## 2021-08-07 DIAGNOSIS — D688 Other specified coagulation defects: Secondary | ICD-10-CM | POA: Diagnosis not present

## 2021-08-07 DIAGNOSIS — Z7689 Persons encountering health services in other specified circumstances: Secondary | ICD-10-CM | POA: Diagnosis not present

## 2021-08-07 DIAGNOSIS — N2581 Secondary hyperparathyroidism of renal origin: Secondary | ICD-10-CM | POA: Diagnosis not present

## 2021-08-07 DIAGNOSIS — N186 End stage renal disease: Secondary | ICD-10-CM | POA: Diagnosis not present

## 2021-08-07 DIAGNOSIS — Z992 Dependence on renal dialysis: Secondary | ICD-10-CM | POA: Diagnosis not present

## 2021-08-07 NOTE — Telephone Encounter (Signed)
Left message for patient to call back and schedule Medicare Annual Wellness Visit (AWV) in office.   If not able to come in office, please offer to do virtually or by telephone.  Left office number and my jabber [phone].  Last AWV:06/24/2020  Please schedule at anytime with Nurse Health Advisor.

## 2021-08-10 DIAGNOSIS — N2581 Secondary hyperparathyroidism of renal origin: Secondary | ICD-10-CM | POA: Diagnosis not present

## 2021-08-10 DIAGNOSIS — Z992 Dependence on renal dialysis: Secondary | ICD-10-CM | POA: Diagnosis not present

## 2021-08-10 DIAGNOSIS — Z7689 Persons encountering health services in other specified circumstances: Secondary | ICD-10-CM | POA: Diagnosis not present

## 2021-08-10 DIAGNOSIS — D688 Other specified coagulation defects: Secondary | ICD-10-CM | POA: Diagnosis not present

## 2021-08-10 DIAGNOSIS — N186 End stage renal disease: Secondary | ICD-10-CM | POA: Diagnosis not present

## 2021-08-12 DIAGNOSIS — Z7689 Persons encountering health services in other specified circumstances: Secondary | ICD-10-CM | POA: Diagnosis not present

## 2021-08-12 DIAGNOSIS — N186 End stage renal disease: Secondary | ICD-10-CM | POA: Diagnosis not present

## 2021-08-12 DIAGNOSIS — Z992 Dependence on renal dialysis: Secondary | ICD-10-CM | POA: Diagnosis not present

## 2021-08-12 DIAGNOSIS — D688 Other specified coagulation defects: Secondary | ICD-10-CM | POA: Diagnosis not present

## 2021-08-12 DIAGNOSIS — N2581 Secondary hyperparathyroidism of renal origin: Secondary | ICD-10-CM | POA: Diagnosis not present

## 2021-08-15 DIAGNOSIS — Z7689 Persons encountering health services in other specified circumstances: Secondary | ICD-10-CM | POA: Diagnosis not present

## 2021-08-15 DIAGNOSIS — Z992 Dependence on renal dialysis: Secondary | ICD-10-CM | POA: Diagnosis not present

## 2021-08-15 DIAGNOSIS — D688 Other specified coagulation defects: Secondary | ICD-10-CM | POA: Diagnosis not present

## 2021-08-15 DIAGNOSIS — N2581 Secondary hyperparathyroidism of renal origin: Secondary | ICD-10-CM | POA: Diagnosis not present

## 2021-08-15 DIAGNOSIS — N186 End stage renal disease: Secondary | ICD-10-CM | POA: Diagnosis not present

## 2021-08-17 DIAGNOSIS — Z7689 Persons encountering health services in other specified circumstances: Secondary | ICD-10-CM | POA: Diagnosis not present

## 2021-08-17 DIAGNOSIS — Z992 Dependence on renal dialysis: Secondary | ICD-10-CM | POA: Diagnosis not present

## 2021-08-17 DIAGNOSIS — D688 Other specified coagulation defects: Secondary | ICD-10-CM | POA: Diagnosis not present

## 2021-08-17 DIAGNOSIS — N2581 Secondary hyperparathyroidism of renal origin: Secondary | ICD-10-CM | POA: Diagnosis not present

## 2021-08-17 DIAGNOSIS — N186 End stage renal disease: Secondary | ICD-10-CM | POA: Diagnosis not present

## 2021-08-19 DIAGNOSIS — D688 Other specified coagulation defects: Secondary | ICD-10-CM | POA: Diagnosis not present

## 2021-08-19 DIAGNOSIS — Z992 Dependence on renal dialysis: Secondary | ICD-10-CM | POA: Diagnosis not present

## 2021-08-19 DIAGNOSIS — N2581 Secondary hyperparathyroidism of renal origin: Secondary | ICD-10-CM | POA: Diagnosis not present

## 2021-08-19 DIAGNOSIS — N186 End stage renal disease: Secondary | ICD-10-CM | POA: Diagnosis not present

## 2021-08-19 DIAGNOSIS — Z7689 Persons encountering health services in other specified circumstances: Secondary | ICD-10-CM | POA: Diagnosis not present

## 2021-08-21 DIAGNOSIS — D688 Other specified coagulation defects: Secondary | ICD-10-CM | POA: Diagnosis not present

## 2021-08-21 DIAGNOSIS — Z7689 Persons encountering health services in other specified circumstances: Secondary | ICD-10-CM | POA: Diagnosis not present

## 2021-08-21 DIAGNOSIS — N2581 Secondary hyperparathyroidism of renal origin: Secondary | ICD-10-CM | POA: Diagnosis not present

## 2021-08-21 DIAGNOSIS — Z992 Dependence on renal dialysis: Secondary | ICD-10-CM | POA: Diagnosis not present

## 2021-08-21 DIAGNOSIS — N186 End stage renal disease: Secondary | ICD-10-CM | POA: Diagnosis not present

## 2021-09-03 ENCOUNTER — Encounter: Payer: Self-pay | Admitting: Nurse Practitioner

## 2021-09-04 ENCOUNTER — Other Ambulatory Visit: Payer: Self-pay

## 2021-09-04 ENCOUNTER — Ambulatory Visit (HOSPITAL_COMMUNITY)
Admission: EM | Admit: 2021-09-04 | Discharge: 2021-09-04 | Disposition: A | Discharge status: Home / Self Care | Payer: Medicare Other | Attending: Emergency Medicine | Admitting: Emergency Medicine

## 2021-09-04 ENCOUNTER — Encounter (HOSPITAL_COMMUNITY): Payer: Self-pay

## 2021-09-04 DIAGNOSIS — J069 Acute upper respiratory infection, unspecified: Secondary | ICD-10-CM

## 2021-09-04 MED ORDER — CETIRIZINE HCL 10 MG PO TABS: 10.0000 mg | ORAL_TABLET | Freq: Every day | ORAL | 0 refills | Status: DC

## 2021-09-04 MED ORDER — IBUPROFEN 800 MG PO TABS: 800.0000 mg | ORAL_TABLET | Freq: Three times a day (TID) | ORAL | 0 refills | Status: DC

## 2021-09-04 MED ORDER — LIDOCAINE VISCOUS HCL 2 % MT SOLN: 15.0000 mL | OROMUCOSAL | 0 refills | Status: DC | PRN

## 2021-09-04 NOTE — ED Provider Notes (Signed)
[place]    CSN: [id number] Arrival date & time: 09/04/21  1003      History   Chief Complaint Chief Complaint  Patient presents with   Sore Throat   Cough    HPI REEYA BOUND is a 65 y.o. female.   Patient presents with nasal congestion, rhinorrhea, sore throat and nonproductive cough for 4 days.  Sore throat is worse in the morning. Tolerating food and liquids. Has attempted use of chloraseptic spray and salt water gargles which are not helpful. No known sick contacts.  History of arthritis, chronic bronchitis, end-stage renal disease on dialysis, GERD, hypertension and thyroid disease.  Denies fever, chills, body aches, shortness of breath, wheezing, abdominal pain, nausea, vomiting, diarrhea.  Past Medical History:  Diagnosis Date   Arthritis    Chronic bronchitis ([place])    Complication of anesthesia ~ 2011   "they gave me a medicine that swolled me and mouth burning up" (58/0/9983)   Complication of anesthesia    slow to wake up   ESRD (end stage renal disease) on dialysis [facility]) Nephrologist-- dr deterding   ESRD due to HTN-; "M/W/F; [place]" (11/28/2015   GERD (gastroesophageal reflux disease)    no meds, diet controlled   History of acute pulmonary edema    2003   Hypertension    no longer on medications   Left patella fracture    Pneumonia    as a child   Seasonal allergies    Secondary hyperparathyroidism, renal ([place])    s/p  total parathyroidectomy  2014   Thyroid disease    Wears glasses    Wound dehiscence, surgical 11/28/2015    Patient Active Problem List   Diagnosis Date Noted   Aortic atherosclerosis ([place]) 02/23/2021   Bilateral carpal tunnel syndrome 02/26/2019   Bilateral sensorineural hearing loss 01/16/2019   Dehiscence of operative wound 38/25/0539   Complication associated with orthopedic device ([place]) 10/02/2015   Deficiency of macronutrients 10/04/2013   Aftercare following surgery of the circulatory system, NEC  03/30/2013   Mechanical complication of other vascular device, implant, and graft 01/12/2013   Disorder of calcium metabolism 10/27/2012   Hyperlipidemia 10/25/2012   Chest pain, mid sternal 08/23/2012   Hypoparathyroidism ([place]) 07/10/2012   Thyroid nodule 03/22/2011   Hypertensive disorder 03/10/2011   Kidney disease 03/10/2011   Hyperparathyroidism due to renal insufficiency ([place]) 06/21/2003   Anemia in chronic kidney disease 06/28/2001   Iron deficiency anemia 06/28/2001   End-stage renal disease ([place]) 09/20/1992    Past Surgical History:  Procedure Laterality Date   ANKLE FRACTURE SURGERY Right ~ 2005   "has pins in it"   APPLICATION OF WOUND VAC Left 7/67/3419   knee   APPLICATION OF WOUND VAC Left 11/28/2015   Procedure: APPLICATION OF WOUND VAC;  Surgeon: Rod Can, MD;  Location: [place];  Service: Orthopedics;  Laterality: Left;   ARTERIOVENOUS GRAFT PLACEMENT  03-25-2005   Right forearm  w/  multiple Revision's    CARDIOVASCULAR STRESS TEST  08-24-2012   abnormal nuclear study/  inferolateral and anteroseptal areas of scar,  no ischemia/  normal LV function and wall motion, ef 77%   DIALYSIS FISTULA CREATION  1992   left upper arm ---  w/  Multiple Revision's until 2006   ECTOPIC PREGNANCY SURGERY  1970's   FRACTURE SURGERY     HARDWARE REMOVAL Right 07/10/2020   Procedure: Removal of deep implant right fibula and tibia; steroid injection right ankle;  Surgeon: Wylene Simmer, MD;  Location: [place];  Service: Orthopedics;  Laterality: Right;   I & D EXTREMITY Left 11/28/2015   Procedure: IRRIGATION AND DEBRIDEMENT LEFT KNEE WOUND ;  Surgeon: Rod Can, MD;  Location: [facility] [facility];  Service: Orthopedics;  Laterality: Left;   INCISION AND DRAINAGE OF WOUND Left 11/28/2015   knee   ORIF PATELLA Left 10/31/2015   Procedure: OPEN REDUCTION INTERNAL (ORIF) FIXATION LEFT PATELLA;  Surgeon: Rod Can, MD;  Location: [place];  Service: Orthopedics;   Laterality: Left;   PATELLAR TENDON REPAIR  10/03/2012   Procedure: PATELLA TENDON REPAIR;  Surgeon: Marin Shutter, MD;  Location: [place];  Service: Orthopedics;  Laterality: Right;   PATELLECTOMY  10/03/2012   Procedure: PATELLECTOMY;  Surgeon: Marin Shutter, MD;  Location: [facility];  Service: Orthopedics;  Laterality: Right;  RIGHT PARTIAL PATELLECTOMY AND PATELLA TENDON REPAIR   REVISION OF ARTERIOVENOUS GORETEX GRAFT  09/27/2012   Procedure: REVISION OF ARTERIOVENOUS GORETEX GRAFT;  Surgeon: Mal Misty, MD;  Location: [facility];  Service: Vascular;  Laterality: Right;  1) Replacement of venous half of loop with 16mm Gortex graft  2) Excision of erroded pseudoaneurysm of graft with primary closure.   REVISION OF ARTERIOVENOUS GORETEX GRAFT Right 01/23/2013   Procedure: REVISION OF ARTERIOVENOUS GORETEX GRAFT;  Surgeon: Conrad Sedalia, MD;  Location: [facility];  Service: Vascular;  Laterality: Right;  Using piece of 16mm x 20cm Gortex graft.    REVISION OF ARTERIOVENOUS GORETEX GRAFT Right 10/07/2015   Procedure: REVISION OF Right arm ARTERIOVENOUS GORETEX GRAFT;  Surgeon: Elam Dutch, MD;  Location: [place];  Service: Vascular;  Laterality: Right;   REVISION OF ARTERIOVENOUS GORETEX GRAFT Right 02/17/2016   Procedure: REVISION OF ARTERIOVENOUS GORETEX GRAFT;  Surgeon: Elam Dutch, MD;  Location: [facility];  Service: Vascular;  Laterality: Right;   TOTAL ABDOMINAL HYSTERECTOMY  03-05-2005   w/  Right Ovarian Cystectomy   TOTAL PARATHYROIDECTOMY/  THYROID ISTHMUSECTOMY/  AUTOTRANSPLANTATION PARATHYROID TISSUE TO LEFT BRACHIORIADIALIS MUSCLE  02-18-2011   TRANSTHORACIC ECHOCARDIOGRAM  10-31-2012   mild LVH,  grade 1 diastolic dysfunction,  ef 55-65%/  mild MR/  trivial TR    OB History   No obstetric history on file.      Home Medications    Prior to Admission medications   Medication Sig Start Date End Date Taking? Authorizing Provider  albuterol (VENTOLIN HFA) 108 (90 Base) MCG/ACT inhaler Inhale  1-2 puffs into the lungs every 6 (six) hours as needed for wheezing or shortness of breath. 05/01/21   Hazel Sams, PA-C  calcium acetate (PHOSLO) 667 MG capsule Take 2,001 mg by mouth 3 (three) times daily.  05/28/20   [provider]  fluticasone (FLONASE) 50 MCG/ACT nasal spray Place 2 sprays into both nostrils daily as needed.     [provider]  rosuvastatin (CRESTOR) 10 MG tablet Take 1 tablet (10 mg total) by mouth daily. 06/02/21   Nche, Charlene Brooke, NP    Family History Family History  Problem Relation Age of Onset   Healthy Mother    Hypertension Father    Kidney failure Father    Hypertension Sister    Thyroid disease Sister     Social History Social History   Tobacco Use   Smoking status: Never   Smokeless tobacco: Never  Vaping Use   Vaping Use: Never used  Substance Use Topics   Alcohol use: Yes    Alcohol/week:  0.0 standard drinks    Comment: occasionally drinks a few beers   Drug use: No     Allergies   Amlodipine, Cefazolin, Cephalosporins, and Lisinopril   Review of Systems Review of Systems  Constitutional: Negative.   HENT:  Positive for congestion, rhinorrhea and sore throat. Negative for dental problem, drooling, ear discharge, ear pain, facial swelling, hearing loss, mouth sores, nosebleeds, postnasal drip, sinus pressure, sinus pain, sneezing, tinnitus, trouble swallowing and voice change.   Respiratory:  Positive for cough.   Gastrointestinal:  Positive for diarrhea. Negative for abdominal distention, abdominal pain, anal bleeding, blood in stool, constipation, nausea, rectal pain and vomiting.  Skin: Negative.   Neurological: Negative.     Physical Exam Triage Vital Signs ED Triage Vitals  Enc Vitals Group     BP 09/04/21 1137 125/73     Pulse Rate 09/04/21 1137 90     Resp 09/04/21 1137 17     Temp 09/04/21 1137 98.5 F (36.9 C)     Temp Source 09/04/21 1137 Oral     SpO2 09/04/21 1137 95 %     Weight --       Height --      Head Circumference --      Peak Flow --      Pain Score 09/04/21 1139 6     Pain Loc --      Pain Edu? --      Excl. in [place]? --    No data found.  Updated Vital Signs BP 125/73 (BP Location: Left Arm)    Pulse 90    Temp 98.5 F (36.9 C) (Oral)    Resp 17    LMP  (LMP Unknown)    SpO2 95%   Visual Acuity Right Eye Distance:   Left Eye Distance:   Bilateral Distance:    Right Eye Near:   Left Eye Near:    Bilateral Near:     Physical Exam Constitutional:      Appearance: Normal appearance. She is normal weight.  HENT:     Head: Normocephalic.     Right Ear: Tympanic membrane, ear canal and external ear normal.     Left Ear: Tympanic membrane, ear canal and external ear normal.     Nose: Congestion and rhinorrhea present.     Mouth/Throat:     Mouth: Mucous membranes are moist.     Pharynx: Posterior oropharyngeal erythema present.  Eyes:     Extraocular Movements: Extraocular movements intact.  Cardiovascular:     Rate and Rhythm: Normal rate and regular rhythm.     Pulses: Normal pulses.     Heart sounds: Normal heart sounds.  Pulmonary:     Effort: Pulmonary effort is normal.     Breath sounds: Normal breath sounds.  Musculoskeletal:     Cervical back: Normal range of motion and neck supple.  Skin:    General: Skin is warm and dry.  Neurological:     Mental Status: She is alert and oriented to person, place, and time. Mental status is at baseline.     Cranial Nerves: No cranial nerve deficit.  Psychiatric:        Mood and Affect: Mood normal.        Behavior: Behavior normal.     UC Treatments / Results  Labs (all labs ordered are listed, but only abnormal results are displayed) Labs Reviewed - No data to display  EKG   Radiology No results found.  Procedures  Procedures (including critical care time)  Medications Ordered in UC Medications - No data to display  Initial Impression / Assessment and Plan / UC Course  I have reviewed  the triage vital signs and the nursing notes.  Pertinent labs & imaging results that were available during my care of the patient were reviewed by me and considered in my medical decision making (see chart for details).  Viral URI with cough   Etiology of symptoms most likely viral, discussed with patient, sore throat is most likely worsened by postnasal drip, discussed with patient  1.  Cetirizine 10 mg daily 2.  Ibuprofen 800 mg 3 times daily as needed 3.  Lidocaine viscus 2% 15 mils every 4 hours as needed 4.  Over-the-counter medications for remaining symptoms 5. Urgent care follow-up as needed Final Clinical Impressions(s) / UC Diagnoses   Final diagnoses:  None   Discharge Instructions   None    ED Prescriptions   None    PDMP not reviewed this encounter.   Hans Eden, NP 09/04/21 1248

## 2021-09-04 NOTE — ED Triage Notes (Signed)
Pt presents with sore throat and non productive cough X 4 days.

## 2021-09-04 NOTE — Discharge Instructions (Signed)
Your symptoms today are viral meaning they will improve with time, your sore throat is being caused from the mucus and congestion running down it to be making it more irritated which in turn causes you to cough which will also make your throat feels sore  Begin taking Zyrtec once a day, this medication is to help reduce secretions  You may gargle and spit lidocaine solution every 4 hours as needed for temporary relief of your sore throat  May use ibuprofen every 8 hours as needed in addition to Tylenol for additional comfort  You may follow-up at urgent care as needed

## 2021-09-11 ENCOUNTER — Telehealth: Payer: Self-pay | Admitting: Nurse Practitioner

## 2021-09-11 NOTE — Telephone Encounter (Signed)
I attempted to leave message for patient to call back and schedule Medicare Annual Wellness Visit (AWV) in office. No voice mail.  If not able to come in office, please offer to do virtually or by telephone.  Left office number and my jabber [phone].  Last AWV:06/24/2020  Please schedule at anytime with Nurse Health Advisor.

## 2021-09-21 DIAGNOSIS — N2581 Secondary hyperparathyroidism of renal origin: Secondary | ICD-10-CM | POA: Diagnosis not present

## 2021-09-21 DIAGNOSIS — N186 End stage renal disease: Secondary | ICD-10-CM | POA: Diagnosis not present

## 2021-09-21 DIAGNOSIS — D688 Other specified coagulation defects: Secondary | ICD-10-CM | POA: Diagnosis not present

## 2021-09-21 DIAGNOSIS — R52 Pain, unspecified: Secondary | ICD-10-CM | POA: Diagnosis not present

## 2021-09-21 DIAGNOSIS — Z992 Dependence on renal dialysis: Secondary | ICD-10-CM | POA: Diagnosis not present

## 2021-09-23 DIAGNOSIS — R52 Pain, unspecified: Secondary | ICD-10-CM | POA: Diagnosis not present

## 2021-09-23 DIAGNOSIS — D688 Other specified coagulation defects: Secondary | ICD-10-CM | POA: Diagnosis not present

## 2021-09-23 DIAGNOSIS — N186 End stage renal disease: Secondary | ICD-10-CM | POA: Diagnosis not present

## 2021-09-23 DIAGNOSIS — N2581 Secondary hyperparathyroidism of renal origin: Secondary | ICD-10-CM | POA: Diagnosis not present

## 2021-09-23 DIAGNOSIS — Z992 Dependence on renal dialysis: Secondary | ICD-10-CM | POA: Diagnosis not present

## 2021-09-25 DIAGNOSIS — Z992 Dependence on renal dialysis: Secondary | ICD-10-CM | POA: Diagnosis not present

## 2021-09-25 DIAGNOSIS — R52 Pain, unspecified: Secondary | ICD-10-CM | POA: Diagnosis not present

## 2021-09-25 DIAGNOSIS — N186 End stage renal disease: Secondary | ICD-10-CM | POA: Diagnosis not present

## 2021-09-25 DIAGNOSIS — N2581 Secondary hyperparathyroidism of renal origin: Secondary | ICD-10-CM | POA: Diagnosis not present

## 2021-09-25 DIAGNOSIS — D688 Other specified coagulation defects: Secondary | ICD-10-CM | POA: Diagnosis not present

## 2021-09-28 DIAGNOSIS — N186 End stage renal disease: Secondary | ICD-10-CM | POA: Diagnosis not present

## 2021-09-28 DIAGNOSIS — R52 Pain, unspecified: Secondary | ICD-10-CM | POA: Diagnosis not present

## 2021-09-28 DIAGNOSIS — N2581 Secondary hyperparathyroidism of renal origin: Secondary | ICD-10-CM | POA: Diagnosis not present

## 2021-09-28 DIAGNOSIS — Z992 Dependence on renal dialysis: Secondary | ICD-10-CM | POA: Diagnosis not present

## 2021-09-28 DIAGNOSIS — D688 Other specified coagulation defects: Secondary | ICD-10-CM | POA: Diagnosis not present

## 2021-09-30 DIAGNOSIS — Z992 Dependence on renal dialysis: Secondary | ICD-10-CM | POA: Diagnosis not present

## 2021-09-30 DIAGNOSIS — R52 Pain, unspecified: Secondary | ICD-10-CM | POA: Diagnosis not present

## 2021-09-30 DIAGNOSIS — N2581 Secondary hyperparathyroidism of renal origin: Secondary | ICD-10-CM | POA: Diagnosis not present

## 2021-09-30 DIAGNOSIS — N186 End stage renal disease: Secondary | ICD-10-CM | POA: Diagnosis not present

## 2021-09-30 DIAGNOSIS — D688 Other specified coagulation defects: Secondary | ICD-10-CM | POA: Diagnosis not present

## 2021-10-02 DIAGNOSIS — R52 Pain, unspecified: Secondary | ICD-10-CM | POA: Diagnosis not present

## 2021-10-02 DIAGNOSIS — Z992 Dependence on renal dialysis: Secondary | ICD-10-CM | POA: Diagnosis not present

## 2021-10-02 DIAGNOSIS — N186 End stage renal disease: Secondary | ICD-10-CM | POA: Diagnosis not present

## 2021-10-02 DIAGNOSIS — D688 Other specified coagulation defects: Secondary | ICD-10-CM | POA: Diagnosis not present

## 2021-10-02 DIAGNOSIS — N2581 Secondary hyperparathyroidism of renal origin: Secondary | ICD-10-CM | POA: Diagnosis not present

## 2021-10-05 DIAGNOSIS — Z992 Dependence on renal dialysis: Secondary | ICD-10-CM | POA: Diagnosis not present

## 2021-10-05 DIAGNOSIS — D688 Other specified coagulation defects: Secondary | ICD-10-CM | POA: Diagnosis not present

## 2021-10-05 DIAGNOSIS — N2581 Secondary hyperparathyroidism of renal origin: Secondary | ICD-10-CM | POA: Diagnosis not present

## 2021-10-05 DIAGNOSIS — R52 Pain, unspecified: Secondary | ICD-10-CM | POA: Diagnosis not present

## 2021-10-05 DIAGNOSIS — N186 End stage renal disease: Secondary | ICD-10-CM | POA: Diagnosis not present

## 2021-10-07 DIAGNOSIS — N186 End stage renal disease: Secondary | ICD-10-CM | POA: Diagnosis not present

## 2021-10-07 DIAGNOSIS — R52 Pain, unspecified: Secondary | ICD-10-CM | POA: Diagnosis not present

## 2021-10-07 DIAGNOSIS — Z992 Dependence on renal dialysis: Secondary | ICD-10-CM | POA: Diagnosis not present

## 2021-10-07 DIAGNOSIS — D688 Other specified coagulation defects: Secondary | ICD-10-CM | POA: Diagnosis not present

## 2021-10-07 DIAGNOSIS — N2581 Secondary hyperparathyroidism of renal origin: Secondary | ICD-10-CM | POA: Diagnosis not present

## 2021-10-09 DIAGNOSIS — Z992 Dependence on renal dialysis: Secondary | ICD-10-CM | POA: Diagnosis not present

## 2021-10-09 DIAGNOSIS — R52 Pain, unspecified: Secondary | ICD-10-CM | POA: Diagnosis not present

## 2021-10-09 DIAGNOSIS — N2581 Secondary hyperparathyroidism of renal origin: Secondary | ICD-10-CM | POA: Diagnosis not present

## 2021-10-09 DIAGNOSIS — D688 Other specified coagulation defects: Secondary | ICD-10-CM | POA: Diagnosis not present

## 2021-10-09 DIAGNOSIS — N186 End stage renal disease: Secondary | ICD-10-CM | POA: Diagnosis not present

## 2021-10-12 DIAGNOSIS — R52 Pain, unspecified: Secondary | ICD-10-CM | POA: Diagnosis not present

## 2021-10-12 DIAGNOSIS — N2581 Secondary hyperparathyroidism of renal origin: Secondary | ICD-10-CM | POA: Diagnosis not present

## 2021-10-12 DIAGNOSIS — N186 End stage renal disease: Secondary | ICD-10-CM | POA: Diagnosis not present

## 2021-10-12 DIAGNOSIS — Z992 Dependence on renal dialysis: Secondary | ICD-10-CM | POA: Diagnosis not present

## 2021-10-12 DIAGNOSIS — D688 Other specified coagulation defects: Secondary | ICD-10-CM | POA: Diagnosis not present

## 2021-10-14 DIAGNOSIS — Z992 Dependence on renal dialysis: Secondary | ICD-10-CM | POA: Diagnosis not present

## 2021-10-14 DIAGNOSIS — N186 End stage renal disease: Secondary | ICD-10-CM | POA: Diagnosis not present

## 2021-10-14 DIAGNOSIS — D688 Other specified coagulation defects: Secondary | ICD-10-CM | POA: Diagnosis not present

## 2021-10-14 DIAGNOSIS — N2581 Secondary hyperparathyroidism of renal origin: Secondary | ICD-10-CM | POA: Diagnosis not present

## 2021-10-14 DIAGNOSIS — R52 Pain, unspecified: Secondary | ICD-10-CM | POA: Diagnosis not present

## 2021-10-16 DIAGNOSIS — D688 Other specified coagulation defects: Secondary | ICD-10-CM | POA: Diagnosis not present

## 2021-10-16 DIAGNOSIS — R52 Pain, unspecified: Secondary | ICD-10-CM | POA: Diagnosis not present

## 2021-10-16 DIAGNOSIS — N2581 Secondary hyperparathyroidism of renal origin: Secondary | ICD-10-CM | POA: Diagnosis not present

## 2021-10-16 DIAGNOSIS — Z992 Dependence on renal dialysis: Secondary | ICD-10-CM | POA: Diagnosis not present

## 2021-10-16 DIAGNOSIS — N186 End stage renal disease: Secondary | ICD-10-CM | POA: Diagnosis not present

## 2021-10-19 ENCOUNTER — Telehealth: Payer: Self-pay | Admitting: Nurse Practitioner

## 2021-10-19 DIAGNOSIS — N2581 Secondary hyperparathyroidism of renal origin: Secondary | ICD-10-CM | POA: Diagnosis not present

## 2021-10-19 DIAGNOSIS — R52 Pain, unspecified: Secondary | ICD-10-CM | POA: Diagnosis not present

## 2021-10-19 DIAGNOSIS — D688 Other specified coagulation defects: Secondary | ICD-10-CM | POA: Diagnosis not present

## 2021-10-19 DIAGNOSIS — N186 End stage renal disease: Secondary | ICD-10-CM | POA: Diagnosis not present

## 2021-10-19 DIAGNOSIS — Z992 Dependence on renal dialysis: Secondary | ICD-10-CM | POA: Diagnosis not present

## 2021-10-19 NOTE — Telephone Encounter (Signed)
I attempted to leave message for patient to call back and schedule Medicare Annual Wellness Visit (AWV) in office. Patient doesn't have voice mail.  If not able to come in office, please offer to do virtually or by telephone.  Left office number and my jabber [phone].  Last AWV:06/24/2020  Please schedule at anytime with Nurse Health Advisor.

## 2021-10-20 DIAGNOSIS — Z992 Dependence on renal dialysis: Secondary | ICD-10-CM | POA: Diagnosis not present

## 2021-10-20 DIAGNOSIS — I120 Hypertensive chronic kidney disease with stage 5 chronic kidney disease or end stage renal disease: Secondary | ICD-10-CM | POA: Diagnosis not present

## 2021-10-20 DIAGNOSIS — T82858A Stenosis of vascular prosthetic devices, implants and grafts, initial encounter: Secondary | ICD-10-CM | POA: Diagnosis not present

## 2021-10-20 DIAGNOSIS — N186 End stage renal disease: Secondary | ICD-10-CM | POA: Diagnosis not present

## 2021-10-21 DIAGNOSIS — D688 Other specified coagulation defects: Secondary | ICD-10-CM | POA: Diagnosis not present

## 2021-10-21 DIAGNOSIS — N2581 Secondary hyperparathyroidism of renal origin: Secondary | ICD-10-CM | POA: Diagnosis not present

## 2021-10-21 DIAGNOSIS — N186 End stage renal disease: Secondary | ICD-10-CM | POA: Diagnosis not present

## 2021-10-21 DIAGNOSIS — Z992 Dependence on renal dialysis: Secondary | ICD-10-CM | POA: Diagnosis not present

## 2021-10-23 DIAGNOSIS — N2581 Secondary hyperparathyroidism of renal origin: Secondary | ICD-10-CM | POA: Diagnosis not present

## 2021-10-23 DIAGNOSIS — N186 End stage renal disease: Secondary | ICD-10-CM | POA: Diagnosis not present

## 2021-10-23 DIAGNOSIS — Z992 Dependence on renal dialysis: Secondary | ICD-10-CM | POA: Diagnosis not present

## 2021-10-23 DIAGNOSIS — D688 Other specified coagulation defects: Secondary | ICD-10-CM | POA: Diagnosis not present

## 2021-10-26 DIAGNOSIS — N2581 Secondary hyperparathyroidism of renal origin: Secondary | ICD-10-CM | POA: Diagnosis not present

## 2021-10-26 DIAGNOSIS — Z992 Dependence on renal dialysis: Secondary | ICD-10-CM | POA: Diagnosis not present

## 2021-10-26 DIAGNOSIS — D688 Other specified coagulation defects: Secondary | ICD-10-CM | POA: Diagnosis not present

## 2021-10-26 DIAGNOSIS — N186 End stage renal disease: Secondary | ICD-10-CM | POA: Diagnosis not present

## 2021-10-28 DIAGNOSIS — D688 Other specified coagulation defects: Secondary | ICD-10-CM | POA: Diagnosis not present

## 2021-10-28 DIAGNOSIS — N2581 Secondary hyperparathyroidism of renal origin: Secondary | ICD-10-CM | POA: Diagnosis not present

## 2021-10-28 DIAGNOSIS — Z992 Dependence on renal dialysis: Secondary | ICD-10-CM | POA: Diagnosis not present

## 2021-10-28 DIAGNOSIS — N186 End stage renal disease: Secondary | ICD-10-CM | POA: Diagnosis not present

## 2021-10-30 DIAGNOSIS — N186 End stage renal disease: Secondary | ICD-10-CM | POA: Diagnosis not present

## 2021-10-30 DIAGNOSIS — N2581 Secondary hyperparathyroidism of renal origin: Secondary | ICD-10-CM | POA: Diagnosis not present

## 2021-10-30 DIAGNOSIS — D688 Other specified coagulation defects: Secondary | ICD-10-CM | POA: Diagnosis not present

## 2021-10-30 DIAGNOSIS — Z992 Dependence on renal dialysis: Secondary | ICD-10-CM | POA: Diagnosis not present

## 2021-11-02 DIAGNOSIS — N2581 Secondary hyperparathyroidism of renal origin: Secondary | ICD-10-CM | POA: Diagnosis not present

## 2021-11-02 DIAGNOSIS — D688 Other specified coagulation defects: Secondary | ICD-10-CM | POA: Diagnosis not present

## 2021-11-02 DIAGNOSIS — Z992 Dependence on renal dialysis: Secondary | ICD-10-CM | POA: Diagnosis not present

## 2021-11-02 DIAGNOSIS — N186 End stage renal disease: Secondary | ICD-10-CM | POA: Diagnosis not present

## 2021-11-04 DIAGNOSIS — N2581 Secondary hyperparathyroidism of renal origin: Secondary | ICD-10-CM | POA: Diagnosis not present

## 2021-11-04 DIAGNOSIS — N186 End stage renal disease: Secondary | ICD-10-CM | POA: Diagnosis not present

## 2021-11-04 DIAGNOSIS — Z992 Dependence on renal dialysis: Secondary | ICD-10-CM | POA: Diagnosis not present

## 2021-11-04 DIAGNOSIS — D688 Other specified coagulation defects: Secondary | ICD-10-CM | POA: Diagnosis not present

## 2021-11-06 DIAGNOSIS — N186 End stage renal disease: Secondary | ICD-10-CM | POA: Diagnosis not present

## 2021-11-06 DIAGNOSIS — Z992 Dependence on renal dialysis: Secondary | ICD-10-CM | POA: Diagnosis not present

## 2021-11-06 DIAGNOSIS — N2581 Secondary hyperparathyroidism of renal origin: Secondary | ICD-10-CM | POA: Diagnosis not present

## 2021-11-06 DIAGNOSIS — D688 Other specified coagulation defects: Secondary | ICD-10-CM | POA: Diagnosis not present

## 2021-11-09 DIAGNOSIS — N2581 Secondary hyperparathyroidism of renal origin: Secondary | ICD-10-CM | POA: Diagnosis not present

## 2021-11-09 DIAGNOSIS — N186 End stage renal disease: Secondary | ICD-10-CM | POA: Diagnosis not present

## 2021-11-09 DIAGNOSIS — D688 Other specified coagulation defects: Secondary | ICD-10-CM | POA: Diagnosis not present

## 2021-11-09 DIAGNOSIS — Z992 Dependence on renal dialysis: Secondary | ICD-10-CM | POA: Diagnosis not present

## 2021-11-11 DIAGNOSIS — Z992 Dependence on renal dialysis: Secondary | ICD-10-CM | POA: Diagnosis not present

## 2021-11-11 DIAGNOSIS — N186 End stage renal disease: Secondary | ICD-10-CM | POA: Diagnosis not present

## 2021-11-11 DIAGNOSIS — D688 Other specified coagulation defects: Secondary | ICD-10-CM | POA: Diagnosis not present

## 2021-11-11 DIAGNOSIS — N2581 Secondary hyperparathyroidism of renal origin: Secondary | ICD-10-CM | POA: Diagnosis not present

## 2021-11-13 DIAGNOSIS — D688 Other specified coagulation defects: Secondary | ICD-10-CM | POA: Diagnosis not present

## 2021-11-13 DIAGNOSIS — N186 End stage renal disease: Secondary | ICD-10-CM | POA: Diagnosis not present

## 2021-11-13 DIAGNOSIS — Z992 Dependence on renal dialysis: Secondary | ICD-10-CM | POA: Diagnosis not present

## 2021-11-13 DIAGNOSIS — N2581 Secondary hyperparathyroidism of renal origin: Secondary | ICD-10-CM | POA: Diagnosis not present

## 2021-11-16 DIAGNOSIS — D688 Other specified coagulation defects: Secondary | ICD-10-CM | POA: Diagnosis not present

## 2021-11-16 DIAGNOSIS — N2581 Secondary hyperparathyroidism of renal origin: Secondary | ICD-10-CM | POA: Diagnosis not present

## 2021-11-16 DIAGNOSIS — N186 End stage renal disease: Secondary | ICD-10-CM | POA: Diagnosis not present

## 2021-11-16 DIAGNOSIS — Z992 Dependence on renal dialysis: Secondary | ICD-10-CM | POA: Diagnosis not present

## 2021-11-17 DIAGNOSIS — N186 End stage renal disease: Secondary | ICD-10-CM | POA: Diagnosis not present

## 2021-11-17 DIAGNOSIS — Z992 Dependence on renal dialysis: Secondary | ICD-10-CM | POA: Diagnosis not present

## 2021-11-17 DIAGNOSIS — I120 Hypertensive chronic kidney disease with stage 5 chronic kidney disease or end stage renal disease: Secondary | ICD-10-CM | POA: Diagnosis not present

## 2021-11-18 DIAGNOSIS — D688 Other specified coagulation defects: Secondary | ICD-10-CM | POA: Diagnosis not present

## 2021-11-18 DIAGNOSIS — Z992 Dependence on renal dialysis: Secondary | ICD-10-CM | POA: Diagnosis not present

## 2021-11-18 DIAGNOSIS — N186 End stage renal disease: Secondary | ICD-10-CM | POA: Diagnosis not present

## 2021-11-18 DIAGNOSIS — N2581 Secondary hyperparathyroidism of renal origin: Secondary | ICD-10-CM | POA: Diagnosis not present

## 2021-11-20 DIAGNOSIS — Z992 Dependence on renal dialysis: Secondary | ICD-10-CM | POA: Diagnosis not present

## 2021-11-20 DIAGNOSIS — D688 Other specified coagulation defects: Secondary | ICD-10-CM | POA: Diagnosis not present

## 2021-11-20 DIAGNOSIS — N186 End stage renal disease: Secondary | ICD-10-CM | POA: Diagnosis not present

## 2021-11-20 DIAGNOSIS — N2581 Secondary hyperparathyroidism of renal origin: Secondary | ICD-10-CM | POA: Diagnosis not present

## 2021-11-23 DIAGNOSIS — D688 Other specified coagulation defects: Secondary | ICD-10-CM | POA: Diagnosis not present

## 2021-11-23 DIAGNOSIS — Z992 Dependence on renal dialysis: Secondary | ICD-10-CM | POA: Diagnosis not present

## 2021-11-23 DIAGNOSIS — N186 End stage renal disease: Secondary | ICD-10-CM | POA: Diagnosis not present

## 2021-11-23 DIAGNOSIS — N2581 Secondary hyperparathyroidism of renal origin: Secondary | ICD-10-CM | POA: Diagnosis not present

## 2021-11-25 DIAGNOSIS — D688 Other specified coagulation defects: Secondary | ICD-10-CM | POA: Diagnosis not present

## 2021-11-25 DIAGNOSIS — N2581 Secondary hyperparathyroidism of renal origin: Secondary | ICD-10-CM | POA: Diagnosis not present

## 2021-11-25 DIAGNOSIS — N186 End stage renal disease: Secondary | ICD-10-CM | POA: Diagnosis not present

## 2021-11-25 DIAGNOSIS — Z992 Dependence on renal dialysis: Secondary | ICD-10-CM | POA: Diagnosis not present

## 2021-11-27 DIAGNOSIS — D688 Other specified coagulation defects: Secondary | ICD-10-CM | POA: Diagnosis not present

## 2021-11-27 DIAGNOSIS — Z992 Dependence on renal dialysis: Secondary | ICD-10-CM | POA: Diagnosis not present

## 2021-11-27 DIAGNOSIS — N2581 Secondary hyperparathyroidism of renal origin: Secondary | ICD-10-CM | POA: Diagnosis not present

## 2021-11-27 DIAGNOSIS — N186 End stage renal disease: Secondary | ICD-10-CM | POA: Diagnosis not present

## 2021-11-30 DIAGNOSIS — Z992 Dependence on renal dialysis: Secondary | ICD-10-CM | POA: Diagnosis not present

## 2021-11-30 DIAGNOSIS — D688 Other specified coagulation defects: Secondary | ICD-10-CM | POA: Diagnosis not present

## 2021-11-30 DIAGNOSIS — N2581 Secondary hyperparathyroidism of renal origin: Secondary | ICD-10-CM | POA: Diagnosis not present

## 2021-11-30 DIAGNOSIS — N186 End stage renal disease: Secondary | ICD-10-CM | POA: Diagnosis not present

## 2021-12-01 ENCOUNTER — Ambulatory Visit: Payer: Medicare Other | Admitting: Nurse Practitioner

## 2021-12-02 DIAGNOSIS — D688 Other specified coagulation defects: Secondary | ICD-10-CM | POA: Diagnosis not present

## 2021-12-02 DIAGNOSIS — Z992 Dependence on renal dialysis: Secondary | ICD-10-CM | POA: Diagnosis not present

## 2021-12-02 DIAGNOSIS — N2581 Secondary hyperparathyroidism of renal origin: Secondary | ICD-10-CM | POA: Diagnosis not present

## 2021-12-02 DIAGNOSIS — N186 End stage renal disease: Secondary | ICD-10-CM | POA: Diagnosis not present

## 2021-12-03 DIAGNOSIS — N186 End stage renal disease: Secondary | ICD-10-CM | POA: Diagnosis not present

## 2021-12-03 DIAGNOSIS — Z992 Dependence on renal dialysis: Secondary | ICD-10-CM | POA: Diagnosis not present

## 2021-12-03 DIAGNOSIS — T82858A Stenosis of vascular prosthetic devices, implants and grafts, initial encounter: Secondary | ICD-10-CM | POA: Diagnosis not present

## 2021-12-03 DIAGNOSIS — I871 Compression of vein: Secondary | ICD-10-CM | POA: Diagnosis not present

## 2021-12-04 DIAGNOSIS — N2581 Secondary hyperparathyroidism of renal origin: Secondary | ICD-10-CM | POA: Diagnosis not present

## 2021-12-04 DIAGNOSIS — N186 End stage renal disease: Secondary | ICD-10-CM | POA: Diagnosis not present

## 2021-12-04 DIAGNOSIS — D688 Other specified coagulation defects: Secondary | ICD-10-CM | POA: Diagnosis not present

## 2021-12-04 DIAGNOSIS — Z992 Dependence on renal dialysis: Secondary | ICD-10-CM | POA: Diagnosis not present

## 2021-12-07 DIAGNOSIS — D688 Other specified coagulation defects: Secondary | ICD-10-CM | POA: Diagnosis not present

## 2021-12-07 DIAGNOSIS — N186 End stage renal disease: Secondary | ICD-10-CM | POA: Diagnosis not present

## 2021-12-07 DIAGNOSIS — N2581 Secondary hyperparathyroidism of renal origin: Secondary | ICD-10-CM | POA: Diagnosis not present

## 2021-12-07 DIAGNOSIS — Z992 Dependence on renal dialysis: Secondary | ICD-10-CM | POA: Diagnosis not present

## 2021-12-09 DIAGNOSIS — N2581 Secondary hyperparathyroidism of renal origin: Secondary | ICD-10-CM | POA: Diagnosis not present

## 2021-12-09 DIAGNOSIS — N186 End stage renal disease: Secondary | ICD-10-CM | POA: Diagnosis not present

## 2021-12-09 DIAGNOSIS — D688 Other specified coagulation defects: Secondary | ICD-10-CM | POA: Diagnosis not present

## 2021-12-09 DIAGNOSIS — Z992 Dependence on renal dialysis: Secondary | ICD-10-CM | POA: Diagnosis not present

## 2021-12-11 DIAGNOSIS — N186 End stage renal disease: Secondary | ICD-10-CM | POA: Diagnosis not present

## 2021-12-11 DIAGNOSIS — D688 Other specified coagulation defects: Secondary | ICD-10-CM | POA: Diagnosis not present

## 2021-12-11 DIAGNOSIS — Z992 Dependence on renal dialysis: Secondary | ICD-10-CM | POA: Diagnosis not present

## 2021-12-11 DIAGNOSIS — N2581 Secondary hyperparathyroidism of renal origin: Secondary | ICD-10-CM | POA: Diagnosis not present

## 2021-12-14 DIAGNOSIS — D688 Other specified coagulation defects: Secondary | ICD-10-CM | POA: Diagnosis not present

## 2021-12-14 DIAGNOSIS — Z992 Dependence on renal dialysis: Secondary | ICD-10-CM | POA: Diagnosis not present

## 2021-12-14 DIAGNOSIS — N186 End stage renal disease: Secondary | ICD-10-CM | POA: Diagnosis not present

## 2021-12-14 DIAGNOSIS — N2581 Secondary hyperparathyroidism of renal origin: Secondary | ICD-10-CM | POA: Diagnosis not present

## 2021-12-16 DIAGNOSIS — Z992 Dependence on renal dialysis: Secondary | ICD-10-CM | POA: Diagnosis not present

## 2021-12-16 DIAGNOSIS — N186 End stage renal disease: Secondary | ICD-10-CM | POA: Diagnosis not present

## 2021-12-16 DIAGNOSIS — D688 Other specified coagulation defects: Secondary | ICD-10-CM | POA: Diagnosis not present

## 2021-12-16 DIAGNOSIS — N2581 Secondary hyperparathyroidism of renal origin: Secondary | ICD-10-CM | POA: Diagnosis not present

## 2021-12-18 DIAGNOSIS — N186 End stage renal disease: Secondary | ICD-10-CM | POA: Diagnosis not present

## 2021-12-18 DIAGNOSIS — N2581 Secondary hyperparathyroidism of renal origin: Secondary | ICD-10-CM | POA: Diagnosis not present

## 2021-12-18 DIAGNOSIS — Z992 Dependence on renal dialysis: Secondary | ICD-10-CM | POA: Diagnosis not present

## 2021-12-18 DIAGNOSIS — I120 Hypertensive chronic kidney disease with stage 5 chronic kidney disease or end stage renal disease: Secondary | ICD-10-CM | POA: Diagnosis not present

## 2021-12-18 DIAGNOSIS — D688 Other specified coagulation defects: Secondary | ICD-10-CM | POA: Diagnosis not present

## 2021-12-21 DIAGNOSIS — N186 End stage renal disease: Secondary | ICD-10-CM | POA: Diagnosis not present

## 2021-12-21 DIAGNOSIS — D688 Other specified coagulation defects: Secondary | ICD-10-CM | POA: Diagnosis not present

## 2021-12-21 DIAGNOSIS — Z992 Dependence on renal dialysis: Secondary | ICD-10-CM | POA: Diagnosis not present

## 2021-12-21 DIAGNOSIS — N2581 Secondary hyperparathyroidism of renal origin: Secondary | ICD-10-CM | POA: Diagnosis not present

## 2021-12-23 DIAGNOSIS — Z992 Dependence on renal dialysis: Secondary | ICD-10-CM | POA: Diagnosis not present

## 2021-12-23 DIAGNOSIS — N2581 Secondary hyperparathyroidism of renal origin: Secondary | ICD-10-CM | POA: Diagnosis not present

## 2021-12-23 DIAGNOSIS — D688 Other specified coagulation defects: Secondary | ICD-10-CM | POA: Diagnosis not present

## 2021-12-23 DIAGNOSIS — N186 End stage renal disease: Secondary | ICD-10-CM | POA: Diagnosis not present

## 2021-12-24 ENCOUNTER — Ambulatory Visit: Payer: Medicare Other | Admitting: Nurse Practitioner

## 2021-12-25 DIAGNOSIS — N2581 Secondary hyperparathyroidism of renal origin: Secondary | ICD-10-CM | POA: Diagnosis not present

## 2021-12-25 DIAGNOSIS — D688 Other specified coagulation defects: Secondary | ICD-10-CM | POA: Diagnosis not present

## 2021-12-25 DIAGNOSIS — N186 End stage renal disease: Secondary | ICD-10-CM | POA: Diagnosis not present

## 2021-12-25 DIAGNOSIS — Z992 Dependence on renal dialysis: Secondary | ICD-10-CM | POA: Diagnosis not present

## 2021-12-28 DIAGNOSIS — N186 End stage renal disease: Secondary | ICD-10-CM | POA: Diagnosis not present

## 2021-12-28 DIAGNOSIS — Z992 Dependence on renal dialysis: Secondary | ICD-10-CM | POA: Diagnosis not present

## 2021-12-28 DIAGNOSIS — N2581 Secondary hyperparathyroidism of renal origin: Secondary | ICD-10-CM | POA: Diagnosis not present

## 2021-12-28 DIAGNOSIS — D688 Other specified coagulation defects: Secondary | ICD-10-CM | POA: Diagnosis not present

## 2021-12-30 DIAGNOSIS — N186 End stage renal disease: Secondary | ICD-10-CM | POA: Diagnosis not present

## 2021-12-30 DIAGNOSIS — Z992 Dependence on renal dialysis: Secondary | ICD-10-CM | POA: Diagnosis not present

## 2021-12-30 DIAGNOSIS — D688 Other specified coagulation defects: Secondary | ICD-10-CM | POA: Diagnosis not present

## 2021-12-30 DIAGNOSIS — N2581 Secondary hyperparathyroidism of renal origin: Secondary | ICD-10-CM | POA: Diagnosis not present

## 2022-01-01 DIAGNOSIS — Z992 Dependence on renal dialysis: Secondary | ICD-10-CM | POA: Diagnosis not present

## 2022-01-01 DIAGNOSIS — D688 Other specified coagulation defects: Secondary | ICD-10-CM | POA: Diagnosis not present

## 2022-01-01 DIAGNOSIS — N2581 Secondary hyperparathyroidism of renal origin: Secondary | ICD-10-CM | POA: Diagnosis not present

## 2022-01-01 DIAGNOSIS — N186 End stage renal disease: Secondary | ICD-10-CM | POA: Diagnosis not present

## 2022-01-04 DIAGNOSIS — D688 Other specified coagulation defects: Secondary | ICD-10-CM | POA: Diagnosis not present

## 2022-01-04 DIAGNOSIS — N186 End stage renal disease: Secondary | ICD-10-CM | POA: Diagnosis not present

## 2022-01-04 DIAGNOSIS — Z992 Dependence on renal dialysis: Secondary | ICD-10-CM | POA: Diagnosis not present

## 2022-01-04 DIAGNOSIS — N2581 Secondary hyperparathyroidism of renal origin: Secondary | ICD-10-CM | POA: Diagnosis not present

## 2022-01-06 DIAGNOSIS — Z992 Dependence on renal dialysis: Secondary | ICD-10-CM | POA: Diagnosis not present

## 2022-01-06 DIAGNOSIS — N2581 Secondary hyperparathyroidism of renal origin: Secondary | ICD-10-CM | POA: Diagnosis not present

## 2022-01-06 DIAGNOSIS — D688 Other specified coagulation defects: Secondary | ICD-10-CM | POA: Diagnosis not present

## 2022-01-06 DIAGNOSIS — N186 End stage renal disease: Secondary | ICD-10-CM | POA: Diagnosis not present

## 2022-01-08 DIAGNOSIS — Z992 Dependence on renal dialysis: Secondary | ICD-10-CM | POA: Diagnosis not present

## 2022-01-08 DIAGNOSIS — D688 Other specified coagulation defects: Secondary | ICD-10-CM | POA: Diagnosis not present

## 2022-01-08 DIAGNOSIS — N2581 Secondary hyperparathyroidism of renal origin: Secondary | ICD-10-CM | POA: Diagnosis not present

## 2022-01-08 DIAGNOSIS — N186 End stage renal disease: Secondary | ICD-10-CM | POA: Diagnosis not present

## 2022-01-11 DIAGNOSIS — D688 Other specified coagulation defects: Secondary | ICD-10-CM | POA: Diagnosis not present

## 2022-01-11 DIAGNOSIS — N2581 Secondary hyperparathyroidism of renal origin: Secondary | ICD-10-CM | POA: Diagnosis not present

## 2022-01-11 DIAGNOSIS — N186 End stage renal disease: Secondary | ICD-10-CM | POA: Diagnosis not present

## 2022-01-11 DIAGNOSIS — Z992 Dependence on renal dialysis: Secondary | ICD-10-CM | POA: Diagnosis not present

## 2022-01-13 ENCOUNTER — Encounter: Payer: Self-pay | Admitting: Nurse Practitioner

## 2022-01-13 ENCOUNTER — Ambulatory Visit (INDEPENDENT_AMBULATORY_CARE_PROVIDER_SITE_OTHER): Payer: Medicare Other | Admitting: Nurse Practitioner

## 2022-01-13 VITALS — BP 104/68 | HR 94 | Temp 97.3°F | Ht 63.5 in | Wt 166.2 lb

## 2022-01-13 DIAGNOSIS — Z780 Asymptomatic menopausal state: Secondary | ICD-10-CM

## 2022-01-13 DIAGNOSIS — E782 Mixed hyperlipidemia: Secondary | ICD-10-CM | POA: Diagnosis not present

## 2022-01-13 DIAGNOSIS — N186 End stage renal disease: Secondary | ICD-10-CM | POA: Diagnosis not present

## 2022-01-13 DIAGNOSIS — E041 Nontoxic single thyroid nodule: Secondary | ICD-10-CM | POA: Diagnosis not present

## 2022-01-13 DIAGNOSIS — D688 Other specified coagulation defects: Secondary | ICD-10-CM | POA: Diagnosis not present

## 2022-01-13 DIAGNOSIS — N2581 Secondary hyperparathyroidism of renal origin: Secondary | ICD-10-CM | POA: Diagnosis not present

## 2022-01-13 DIAGNOSIS — Z9889 Other specified postprocedural states: Secondary | ICD-10-CM

## 2022-01-13 DIAGNOSIS — E892 Postprocedural hypoparathyroidism: Secondary | ICD-10-CM | POA: Diagnosis not present

## 2022-01-13 DIAGNOSIS — Z992 Dependence on renal dialysis: Secondary | ICD-10-CM | POA: Diagnosis not present

## 2022-01-13 DIAGNOSIS — I700 Atherosclerosis of aorta: Secondary | ICD-10-CM

## 2022-01-13 MED ORDER — PRAVASTATIN SODIUM 20 MG PO TABS
20.0000 mg | ORAL_TABLET | ORAL | 5 refills | Status: DC
Start: 2022-01-13 — End: 2022-07-15

## 2022-01-13 NOTE — Assessment & Plan Note (Signed)
Unable to tolerate crestor (nausea and abd pain) ? ?D/c crestor ?Start pravastatin 20mg  3x/week with food. ?Repeat lipid panel ?

## 2022-01-13 NOTE — Assessment & Plan Note (Addendum)
Nephrologist: Dr. Jimmy Footman. ?Dialysis schedule MWF via left Arm fistula.(at [facility]) ?Last session today 01/13/2022. She denies any complications. ?No LE edema ?Stable BP ?BP Readings from Last 3 Encounters:  ?01/13/22 104/68  ?09/04/21 125/73  ?06/02/21 104/82  ? ?Requested lab results from dialysis center ?

## 2022-01-13 NOTE — Progress Notes (Signed)
? ?             Established Patient Visit ? ?Patient: Victoria [name]   DOB: [birth year]   66 y.o. Female  MRN: [id number] ?Visit Date: 01/13/2022 ? ?Subjective:  ?  ?Chief Complaint  ?Patient presents with  ? Follow-up  ?  6 month f/u on HTN and cholesterol.  ?Pt is not fasting.  ?Pt states she stopped taking the Rosuvastin 3 months ago because it was making her sick and her kidney doctor told her to discontinue it.   ? ?HPI ?CKD (chronic kidney disease) requiring chronic dialysis ([place]) ?Nephrologist: Dr. Jimmy Footman. ?Dialysis schedule MWF via left Arm fistula.(at [facility]) ?Last session today 01/13/2022. She denies any complications. ?No LE edema ?Stable BP ?BP Readings from Last 3 Encounters:  ?01/13/22 104/68  ?09/04/21 125/73  ?06/02/21 104/82  ? ?Requested lab results from dialysis center ? ?S/P total parathyroidectomy ([place]) ?Done 2012 ?Last serum calcium 01/11/22: 7.1 ?Requested complete lab results from dialysis center. ? ?Hyperlipidemia ?Unable to tolerate crestor (nausea and abd pain) ? ?D/c crestor ?Start pravastatin 20mg  3x/week with food. ?Repeat lipid panel ?   ?Reviewed medical, surgical, and social history today ? ?Medications: ?Outpatient Medications Prior to Visit  ?Medication Sig  ? Acetaminophen (TYLENOL ARTHRITIS PAIN PO) Take by mouth.  ? calcium acetate (PHOSLO) 667 MG capsule Take 2,001 mg by mouth 3 (three) times daily.   ? [DISCONTINUED] albuterol (VENTOLIN HFA) 108 (90 Base) MCG/ACT inhaler Inhale 1-2 puffs into the lungs every 6 (six) hours as needed for wheezing or shortness of breath. (Patient not taking: Reported on 01/13/2022)  ? [DISCONTINUED] cetirizine (ZYRTEC ALLERGY) 10 MG tablet Take 1 tablet (10 mg total) by mouth daily. (Patient not taking: Reported on 01/13/2022)  ? [DISCONTINUED] fluticasone (FLONASE) 50 MCG/ACT nasal spray Place 2 sprays into both nostrils daily as needed.  (Patient not taking: Reported on 01/13/2022)  ? [DISCONTINUED] ibuprofen (ADVIL) 800 MG tablet  Take 1 tablet (800 mg total) by mouth 3 (three) times daily. (Patient not taking: Reported on 01/13/2022)  ? [DISCONTINUED] lidocaine (XYLOCAINE) 2 % solution Use as directed 15 mLs in the mouth or throat as needed for mouth pain. (Patient not taking: Reported on 01/13/2022)  ? [DISCONTINUED] rosuvastatin (CRESTOR) 10 MG tablet Take 1 tablet (10 mg total) by mouth daily. (Patient not taking: Reported on 01/13/2022)  ? ?No facility-administered medications prior to visit.  ? ?Reviewed past medical and social history.  ? ?ROS per HPI above ? ?Last metabolic panel ?Lab Results  ?Component Value Date  ? GLUCOSE 83 07/10/2020  ? NA 140 07/10/2020  ? K 3.7 07/10/2020  ? CL 94 (L) 07/10/2020  ? CO2 28 05/30/2020  ? BUN 28 (H) 07/10/2020  ? CREATININE 7.90 (H) 07/10/2020  ? GFRNONAA 6 (L) 05/30/2020  ? CALCIUM 9.6 05/30/2020  ? PHOS 3.7 02/25/2020  ? PROT 7.1 05/30/2020  ? ALBUMIN 3.1 (L) 05/30/2020  ? BILITOT 0.6 05/30/2020  ? ALKPHOS 56 05/30/2020  ? AST 17 05/30/2020  ? ALT 11 05/30/2020  ? ANIONGAP 14 05/30/2020  ? ?Last lipids ?Lab Results  ?Component Value Date  ? CHOL 163 07/15/2020  ? HDL 47.30 07/15/2020  ? [place] 91 07/15/2020  ? TRIG 120.0 07/15/2020  ? CHOLHDL 3 07/15/2020  ? ?Last thyroid functions ?Lab Results  ?Component Value Date  ? TSH 3.60 07/15/2020  ? ?Last vitamin D ?No results found for: 25OHVITD2, 25OHVITD3, VD25OH ?  ?   ?Objective:  ?BP 104/68 (BP  Location: Left Arm, Patient Position: Sitting, Cuff Size: Normal)   Pulse 94   Temp (!) 97.3 ?F (36.3 ?C) (Temporal)   Ht 5' 3.5" (1.613 m)   Wt 166 lb 3.2 oz (75.4 kg)   LMP  (LMP Unknown)   SpO2 98%   BMI 28.98 kg/m?  ? ?  ? ?Physical Exam ?Cardiovascular:  ?   Rate and Rhythm: Normal rate and regular rhythm.  ?   Pulses: Normal pulses.  ?   Heart sounds: Normal heart sounds.  ?Pulmonary:  ?   Effort: Pulmonary effort is normal.  ?   Breath sounds: Normal breath sounds.  ?Musculoskeletal:  ?   Right lower leg: No edema.  ?   Left lower leg: No  edema.  ?Neurological:  ?   Mental Status: She is alert and oriented to person, place, and time.  ?Psychiatric:     ?   Mood and Affect: Mood normal.     ?   Behavior: Behavior normal.     ?   Thought Content: Thought content normal.  ?  ?No results found for any visits on 01/13/22. ?   ?Assessment & Plan:  ?  ?Problem List Items Addressed This Visit   ? ?  ? Cardiovascular and Mediastinum  ? Aortic atherosclerosis ([place])  ? Relevant Medications  ? pravastatin (PRAVACHOL) 20 MG tablet  ?  ? Endocrine  ? Thyroid nodule  ? Relevant Orders  ? TSH  ?  ? Genitourinary  ? CKD (chronic kidney disease) requiring chronic dialysis ([place]) - Primary  ?  Nephrologist: Dr. Jimmy Footman. ?Dialysis schedule MWF via left Arm fistula.(at [facility]) ?Last session today 01/13/2022. She denies any complications. ?No LE edema ?Stable BP ?BP Readings from Last 3 Encounters:  ?01/13/22 104/68  ?09/04/21 125/73  ?06/02/21 104/82  ? ?Requested lab results from dialysis center ?  ?  ?  ? Other  ? Hyperlipidemia  ?  Unable to tolerate crestor (nausea and abd pain) ? ?D/c crestor ?Start pravastatin 20mg  3x/week with food. ?Repeat lipid panel ? ?  ?  ? Relevant Medications  ? pravastatin (PRAVACHOL) 20 MG tablet  ? S/P total parathyroidectomy ([place])  ?  Done 2012 ?Last serum calcium 01/11/22: 7.1 ?Requested complete lab results from dialysis center. ? ?  ?  ? ?Other Visit Diagnoses   ? ? Asymptomatic age-related postmenopausal state      ? Relevant Orders  ? DG Bone Density  ? ?  ? ?Return in about 6 months (around 07/15/2022) for AWV, hyperlipidemia. ? ?  ? ?Wilfred Lacy, NP ? ? ?

## 2022-01-13 NOTE — Assessment & Plan Note (Addendum)
Done 2012 ?Last serum calcium 01/11/22: 7.1 ?Requested complete lab results from dialysis center. ?

## 2022-01-13 NOTE — Patient Instructions (Addendum)
Let me know if you want to start pravastatin 20mg . ? ?Call breast center for mammogram appt: [phone]. ? ?Return blood samples to lab on same day it is drawn. ? ?Sign medical release to get lab results from dialysis center. ? ?Ok to get another COVID vaccine dose. ?

## 2022-01-14 ENCOUNTER — Other Ambulatory Visit: Payer: Self-pay | Admitting: Nurse Practitioner

## 2022-01-14 DIAGNOSIS — Z780 Asymptomatic menopausal state: Secondary | ICD-10-CM

## 2022-01-14 DIAGNOSIS — Z13820 Encounter for screening for osteoporosis: Secondary | ICD-10-CM

## 2022-01-15 DIAGNOSIS — N2581 Secondary hyperparathyroidism of renal origin: Secondary | ICD-10-CM | POA: Diagnosis not present

## 2022-01-15 DIAGNOSIS — N186 End stage renal disease: Secondary | ICD-10-CM | POA: Diagnosis not present

## 2022-01-15 DIAGNOSIS — D688 Other specified coagulation defects: Secondary | ICD-10-CM | POA: Diagnosis not present

## 2022-01-15 DIAGNOSIS — Z992 Dependence on renal dialysis: Secondary | ICD-10-CM | POA: Diagnosis not present

## 2022-01-17 DIAGNOSIS — Z992 Dependence on renal dialysis: Secondary | ICD-10-CM | POA: Diagnosis not present

## 2022-01-17 DIAGNOSIS — N186 End stage renal disease: Secondary | ICD-10-CM | POA: Diagnosis not present

## 2022-01-17 DIAGNOSIS — I120 Hypertensive chronic kidney disease with stage 5 chronic kidney disease or end stage renal disease: Secondary | ICD-10-CM | POA: Diagnosis not present

## 2022-01-18 ENCOUNTER — Telehealth: Payer: Self-pay | Admitting: Nurse Practitioner

## 2022-01-18 DIAGNOSIS — D689 Coagulation defect, unspecified: Secondary | ICD-10-CM | POA: Diagnosis not present

## 2022-01-18 DIAGNOSIS — D688 Other specified coagulation defects: Secondary | ICD-10-CM | POA: Diagnosis not present

## 2022-01-18 DIAGNOSIS — Z992 Dependence on renal dialysis: Secondary | ICD-10-CM | POA: Diagnosis not present

## 2022-01-18 DIAGNOSIS — N186 End stage renal disease: Secondary | ICD-10-CM | POA: Diagnosis not present

## 2022-01-18 DIAGNOSIS — T8249XA Other complication of vascular dialysis catheter, initial encounter: Secondary | ICD-10-CM | POA: Diagnosis not present

## 2022-01-18 DIAGNOSIS — N2581 Secondary hyperparathyroidism of renal origin: Secondary | ICD-10-CM | POA: Diagnosis not present

## 2022-01-18 DIAGNOSIS — R52 Pain, unspecified: Secondary | ICD-10-CM | POA: Diagnosis not present

## 2022-01-18 NOTE — Telephone Encounter (Signed)
Pt called asking where the bone doctor was that she made an appt to. I did not see any referrals for this.  ?

## 2022-01-19 DIAGNOSIS — I871 Compression of vein: Secondary | ICD-10-CM | POA: Diagnosis not present

## 2022-01-19 DIAGNOSIS — T82858A Stenosis of vascular prosthetic devices, implants and grafts, initial encounter: Secondary | ICD-10-CM | POA: Diagnosis not present

## 2022-01-19 DIAGNOSIS — N186 End stage renal disease: Secondary | ICD-10-CM | POA: Diagnosis not present

## 2022-01-19 DIAGNOSIS — Z992 Dependence on renal dialysis: Secondary | ICD-10-CM | POA: Diagnosis not present

## 2022-01-19 NOTE — Telephone Encounter (Signed)
Pt states she spoke with the breast center and received the information she needed.  ?

## 2022-01-20 DIAGNOSIS — N186 End stage renal disease: Secondary | ICD-10-CM | POA: Diagnosis not present

## 2022-01-20 DIAGNOSIS — D688 Other specified coagulation defects: Secondary | ICD-10-CM | POA: Diagnosis not present

## 2022-01-20 DIAGNOSIS — T8249XA Other complication of vascular dialysis catheter, initial encounter: Secondary | ICD-10-CM | POA: Diagnosis not present

## 2022-01-20 DIAGNOSIS — T82858A Stenosis of vascular prosthetic devices, implants and grafts, initial encounter: Secondary | ICD-10-CM | POA: Diagnosis not present

## 2022-01-20 DIAGNOSIS — R52 Pain, unspecified: Secondary | ICD-10-CM | POA: Diagnosis not present

## 2022-01-20 DIAGNOSIS — Z992 Dependence on renal dialysis: Secondary | ICD-10-CM | POA: Diagnosis not present

## 2022-01-20 DIAGNOSIS — N2581 Secondary hyperparathyroidism of renal origin: Secondary | ICD-10-CM | POA: Diagnosis not present

## 2022-01-20 DIAGNOSIS — D689 Coagulation defect, unspecified: Secondary | ICD-10-CM | POA: Diagnosis not present

## 2022-01-20 DIAGNOSIS — T829XXA Unspecified complication of cardiac and vascular prosthetic device, implant and graft, initial encounter: Secondary | ICD-10-CM | POA: Insufficient documentation

## 2022-01-22 DIAGNOSIS — D688 Other specified coagulation defects: Secondary | ICD-10-CM | POA: Diagnosis not present

## 2022-01-22 DIAGNOSIS — D689 Coagulation defect, unspecified: Secondary | ICD-10-CM | POA: Diagnosis not present

## 2022-01-22 DIAGNOSIS — Z992 Dependence on renal dialysis: Secondary | ICD-10-CM | POA: Diagnosis not present

## 2022-01-22 DIAGNOSIS — N186 End stage renal disease: Secondary | ICD-10-CM | POA: Diagnosis not present

## 2022-01-22 DIAGNOSIS — N2581 Secondary hyperparathyroidism of renal origin: Secondary | ICD-10-CM | POA: Diagnosis not present

## 2022-01-22 DIAGNOSIS — R52 Pain, unspecified: Secondary | ICD-10-CM | POA: Diagnosis not present

## 2022-01-22 DIAGNOSIS — T8249XA Other complication of vascular dialysis catheter, initial encounter: Secondary | ICD-10-CM | POA: Diagnosis not present

## 2022-01-25 DIAGNOSIS — R52 Pain, unspecified: Secondary | ICD-10-CM | POA: Diagnosis not present

## 2022-01-25 DIAGNOSIS — N186 End stage renal disease: Secondary | ICD-10-CM | POA: Diagnosis not present

## 2022-01-25 DIAGNOSIS — N2581 Secondary hyperparathyroidism of renal origin: Secondary | ICD-10-CM | POA: Diagnosis not present

## 2022-01-25 DIAGNOSIS — T8249XA Other complication of vascular dialysis catheter, initial encounter: Secondary | ICD-10-CM | POA: Diagnosis not present

## 2022-01-25 DIAGNOSIS — D689 Coagulation defect, unspecified: Secondary | ICD-10-CM | POA: Diagnosis not present

## 2022-01-25 DIAGNOSIS — D688 Other specified coagulation defects: Secondary | ICD-10-CM | POA: Diagnosis not present

## 2022-01-25 DIAGNOSIS — Z992 Dependence on renal dialysis: Secondary | ICD-10-CM | POA: Diagnosis not present

## 2022-01-27 DIAGNOSIS — T8249XA Other complication of vascular dialysis catheter, initial encounter: Secondary | ICD-10-CM | POA: Diagnosis not present

## 2022-01-27 DIAGNOSIS — D688 Other specified coagulation defects: Secondary | ICD-10-CM | POA: Diagnosis not present

## 2022-01-27 DIAGNOSIS — R52 Pain, unspecified: Secondary | ICD-10-CM | POA: Diagnosis not present

## 2022-01-27 DIAGNOSIS — Z992 Dependence on renal dialysis: Secondary | ICD-10-CM | POA: Diagnosis not present

## 2022-01-27 DIAGNOSIS — D689 Coagulation defect, unspecified: Secondary | ICD-10-CM | POA: Diagnosis not present

## 2022-01-27 DIAGNOSIS — N186 End stage renal disease: Secondary | ICD-10-CM | POA: Diagnosis not present

## 2022-01-27 DIAGNOSIS — N2581 Secondary hyperparathyroidism of renal origin: Secondary | ICD-10-CM | POA: Diagnosis not present

## 2022-01-29 DIAGNOSIS — D688 Other specified coagulation defects: Secondary | ICD-10-CM | POA: Diagnosis not present

## 2022-01-29 DIAGNOSIS — N186 End stage renal disease: Secondary | ICD-10-CM | POA: Diagnosis not present

## 2022-01-29 DIAGNOSIS — D689 Coagulation defect, unspecified: Secondary | ICD-10-CM | POA: Diagnosis not present

## 2022-01-29 DIAGNOSIS — T8249XA Other complication of vascular dialysis catheter, initial encounter: Secondary | ICD-10-CM | POA: Diagnosis not present

## 2022-01-29 DIAGNOSIS — Z992 Dependence on renal dialysis: Secondary | ICD-10-CM | POA: Diagnosis not present

## 2022-01-29 DIAGNOSIS — R52 Pain, unspecified: Secondary | ICD-10-CM | POA: Diagnosis not present

## 2022-01-29 DIAGNOSIS — N2581 Secondary hyperparathyroidism of renal origin: Secondary | ICD-10-CM | POA: Diagnosis not present

## 2022-02-01 ENCOUNTER — Other Ambulatory Visit: Payer: Medicare Other

## 2022-02-01 DIAGNOSIS — R52 Pain, unspecified: Secondary | ICD-10-CM | POA: Diagnosis not present

## 2022-02-01 DIAGNOSIS — N2581 Secondary hyperparathyroidism of renal origin: Secondary | ICD-10-CM | POA: Diagnosis not present

## 2022-02-01 DIAGNOSIS — T8249XA Other complication of vascular dialysis catheter, initial encounter: Secondary | ICD-10-CM | POA: Diagnosis not present

## 2022-02-01 DIAGNOSIS — D688 Other specified coagulation defects: Secondary | ICD-10-CM | POA: Diagnosis not present

## 2022-02-01 DIAGNOSIS — D689 Coagulation defect, unspecified: Secondary | ICD-10-CM | POA: Diagnosis not present

## 2022-02-01 DIAGNOSIS — Z992 Dependence on renal dialysis: Secondary | ICD-10-CM | POA: Diagnosis not present

## 2022-02-01 DIAGNOSIS — N186 End stage renal disease: Secondary | ICD-10-CM | POA: Diagnosis not present

## 2022-02-03 DIAGNOSIS — Z992 Dependence on renal dialysis: Secondary | ICD-10-CM | POA: Diagnosis not present

## 2022-02-03 DIAGNOSIS — R52 Pain, unspecified: Secondary | ICD-10-CM | POA: Diagnosis not present

## 2022-02-03 DIAGNOSIS — N2581 Secondary hyperparathyroidism of renal origin: Secondary | ICD-10-CM | POA: Diagnosis not present

## 2022-02-03 DIAGNOSIS — D688 Other specified coagulation defects: Secondary | ICD-10-CM | POA: Diagnosis not present

## 2022-02-03 DIAGNOSIS — N186 End stage renal disease: Secondary | ICD-10-CM | POA: Diagnosis not present

## 2022-02-03 DIAGNOSIS — D689 Coagulation defect, unspecified: Secondary | ICD-10-CM | POA: Diagnosis not present

## 2022-02-03 DIAGNOSIS — T8249XA Other complication of vascular dialysis catheter, initial encounter: Secondary | ICD-10-CM | POA: Diagnosis not present

## 2022-02-05 DIAGNOSIS — Z992 Dependence on renal dialysis: Secondary | ICD-10-CM | POA: Diagnosis not present

## 2022-02-05 DIAGNOSIS — N2581 Secondary hyperparathyroidism of renal origin: Secondary | ICD-10-CM | POA: Diagnosis not present

## 2022-02-05 DIAGNOSIS — T8249XA Other complication of vascular dialysis catheter, initial encounter: Secondary | ICD-10-CM | POA: Diagnosis not present

## 2022-02-05 DIAGNOSIS — D689 Coagulation defect, unspecified: Secondary | ICD-10-CM | POA: Diagnosis not present

## 2022-02-05 DIAGNOSIS — N186 End stage renal disease: Secondary | ICD-10-CM | POA: Diagnosis not present

## 2022-02-05 DIAGNOSIS — R52 Pain, unspecified: Secondary | ICD-10-CM | POA: Diagnosis not present

## 2022-02-05 DIAGNOSIS — D688 Other specified coagulation defects: Secondary | ICD-10-CM | POA: Diagnosis not present

## 2022-02-08 ENCOUNTER — Other Ambulatory Visit: Payer: Self-pay | Admitting: Radiology

## 2022-02-08 ENCOUNTER — Other Ambulatory Visit (HOSPITAL_COMMUNITY): Payer: Self-pay | Admitting: Nephrology

## 2022-02-08 DIAGNOSIS — D688 Other specified coagulation defects: Secondary | ICD-10-CM | POA: Diagnosis not present

## 2022-02-08 DIAGNOSIS — R52 Pain, unspecified: Secondary | ICD-10-CM | POA: Diagnosis not present

## 2022-02-08 DIAGNOSIS — Z992 Dependence on renal dialysis: Secondary | ICD-10-CM | POA: Diagnosis not present

## 2022-02-08 DIAGNOSIS — N2581 Secondary hyperparathyroidism of renal origin: Secondary | ICD-10-CM | POA: Diagnosis not present

## 2022-02-08 DIAGNOSIS — T8249XA Other complication of vascular dialysis catheter, initial encounter: Secondary | ICD-10-CM | POA: Diagnosis not present

## 2022-02-08 DIAGNOSIS — D689 Coagulation defect, unspecified: Secondary | ICD-10-CM | POA: Diagnosis not present

## 2022-02-08 DIAGNOSIS — N186 End stage renal disease: Secondary | ICD-10-CM

## 2022-02-09 ENCOUNTER — Ambulatory Visit (HOSPITAL_COMMUNITY)
Admission: RE | Admit: 2022-02-09 | Discharge: 2022-02-09 | Disposition: A | Discharge status: Home / Self Care | Payer: Medicare Other | Source: Ambulatory Visit | Attending: Nephrology | Admitting: Nephrology

## 2022-02-09 DIAGNOSIS — N186 End stage renal disease: Secondary | ICD-10-CM | POA: Diagnosis not present

## 2022-02-09 DIAGNOSIS — T859XXA Unspecified complication of internal prosthetic device, implant and graft, initial encounter: Secondary | ICD-10-CM | POA: Insufficient documentation

## 2022-02-09 DIAGNOSIS — X58XXXA Exposure to other specified factors, initial encounter: Secondary | ICD-10-CM | POA: Insufficient documentation

## 2022-02-09 DIAGNOSIS — T8249XA Other complication of vascular dialysis catheter, initial encounter: Secondary | ICD-10-CM | POA: Diagnosis not present

## 2022-02-09 HISTORY — PX: IR FLUORO GUIDE CV LINE LEFT: IMG2282

## 2022-02-09 MED ORDER — VANCOMYCIN HCL IN DEXTROSE 1-5 GM/200ML-% IV SOLN: 1000.0000 mg | INTRAVENOUS | Status: AC

## 2022-02-09 MED ORDER — LIDOCAINE HCL 1 % IJ SOLN
INTRAMUSCULAR | Status: AC
  Filled 2022-02-09: qty 20

## 2022-02-09 MED ORDER — VANCOMYCIN HCL IN DEXTROSE 1-5 GM/200ML-% IV SOLN
INTRAVENOUS | Status: AC
  Administered 2022-02-09: 1000 mg via INTRAVENOUS
  Filled 2022-02-09: qty 200

## 2022-02-09 MED ORDER — HEPARIN SODIUM (PORCINE) 1000 UNIT/ML IJ SOLN
INTRAMUSCULAR | Status: DC | PRN
  Administered 2022-02-09: 4200 [IU] via INTRAVENOUS

## 2022-02-09 MED ORDER — CHLORHEXIDINE GLUCONATE 4 % EX LIQD
CUTANEOUS | Status: AC
  Filled 2022-02-09: qty 15

## 2022-02-09 MED ORDER — LIDOCAINE HCL 1 % IJ SOLN
INTRAMUSCULAR | Status: DC | PRN
  Administered 2022-02-09: 10 mL

## 2022-02-09 MED ORDER — HEPARIN SODIUM (PORCINE) 1000 UNIT/ML IJ SOLN
INTRAMUSCULAR | Status: AC
  Filled 2022-02-09: qty 10

## 2022-02-09 NOTE — Procedures (Signed)
Interventional Radiology Procedure Note  Procedure: [place] RT IJ HD CATH    Complications: None  Estimated Blood Loss:  MIN  Findings: TIP SVCRA    Tamera Punt, MD

## 2022-02-10 DIAGNOSIS — N2581 Secondary hyperparathyroidism of renal origin: Secondary | ICD-10-CM | POA: Diagnosis not present

## 2022-02-10 DIAGNOSIS — N186 End stage renal disease: Secondary | ICD-10-CM | POA: Diagnosis not present

## 2022-02-10 DIAGNOSIS — D689 Coagulation defect, unspecified: Secondary | ICD-10-CM | POA: Diagnosis not present

## 2022-02-10 DIAGNOSIS — D688 Other specified coagulation defects: Secondary | ICD-10-CM | POA: Diagnosis not present

## 2022-02-10 DIAGNOSIS — R52 Pain, unspecified: Secondary | ICD-10-CM | POA: Diagnosis not present

## 2022-02-10 DIAGNOSIS — T8249XA Other complication of vascular dialysis catheter, initial encounter: Secondary | ICD-10-CM | POA: Diagnosis not present

## 2022-02-10 DIAGNOSIS — Z992 Dependence on renal dialysis: Secondary | ICD-10-CM | POA: Diagnosis not present

## 2022-02-12 DIAGNOSIS — T8249XA Other complication of vascular dialysis catheter, initial encounter: Secondary | ICD-10-CM | POA: Diagnosis not present

## 2022-02-12 DIAGNOSIS — Z992 Dependence on renal dialysis: Secondary | ICD-10-CM | POA: Diagnosis not present

## 2022-02-12 DIAGNOSIS — D689 Coagulation defect, unspecified: Secondary | ICD-10-CM | POA: Diagnosis not present

## 2022-02-12 DIAGNOSIS — N2581 Secondary hyperparathyroidism of renal origin: Secondary | ICD-10-CM | POA: Diagnosis not present

## 2022-02-12 DIAGNOSIS — N186 End stage renal disease: Secondary | ICD-10-CM | POA: Diagnosis not present

## 2022-02-12 DIAGNOSIS — D688 Other specified coagulation defects: Secondary | ICD-10-CM | POA: Diagnosis not present

## 2022-02-12 DIAGNOSIS — R52 Pain, unspecified: Secondary | ICD-10-CM | POA: Diagnosis not present

## 2022-02-15 DIAGNOSIS — R52 Pain, unspecified: Secondary | ICD-10-CM | POA: Diagnosis not present

## 2022-02-15 DIAGNOSIS — N2581 Secondary hyperparathyroidism of renal origin: Secondary | ICD-10-CM | POA: Diagnosis not present

## 2022-02-15 DIAGNOSIS — D688 Other specified coagulation defects: Secondary | ICD-10-CM | POA: Diagnosis not present

## 2022-02-15 DIAGNOSIS — D689 Coagulation defect, unspecified: Secondary | ICD-10-CM | POA: Diagnosis not present

## 2022-02-15 DIAGNOSIS — Z992 Dependence on renal dialysis: Secondary | ICD-10-CM | POA: Diagnosis not present

## 2022-02-15 DIAGNOSIS — T8249XA Other complication of vascular dialysis catheter, initial encounter: Secondary | ICD-10-CM | POA: Diagnosis not present

## 2022-02-15 DIAGNOSIS — N186 End stage renal disease: Secondary | ICD-10-CM | POA: Diagnosis not present

## 2022-02-17 ENCOUNTER — Ambulatory Visit (HOSPITAL_COMMUNITY)
Admission: EM | Admit: 2022-02-17 | Discharge: 2022-02-17 | Disposition: A | Discharge status: Home / Self Care | Payer: Medicare Other | Attending: Physician Assistant | Admitting: Physician Assistant

## 2022-02-17 DIAGNOSIS — M546 Pain in thoracic spine: Secondary | ICD-10-CM | POA: Diagnosis not present

## 2022-02-17 DIAGNOSIS — M62838 Other muscle spasm: Secondary | ICD-10-CM

## 2022-02-17 DIAGNOSIS — T8249XA Other complication of vascular dialysis catheter, initial encounter: Secondary | ICD-10-CM | POA: Diagnosis not present

## 2022-02-17 DIAGNOSIS — M792 Neuralgia and neuritis, unspecified: Secondary | ICD-10-CM | POA: Diagnosis not present

## 2022-02-17 DIAGNOSIS — N186 End stage renal disease: Secondary | ICD-10-CM | POA: Diagnosis not present

## 2022-02-17 DIAGNOSIS — R52 Pain, unspecified: Secondary | ICD-10-CM | POA: Diagnosis not present

## 2022-02-17 DIAGNOSIS — D689 Coagulation defect, unspecified: Secondary | ICD-10-CM | POA: Diagnosis not present

## 2022-02-17 DIAGNOSIS — I120 Hypertensive chronic kidney disease with stage 5 chronic kidney disease or end stage renal disease: Secondary | ICD-10-CM | POA: Diagnosis not present

## 2022-02-17 DIAGNOSIS — D688 Other specified coagulation defects: Secondary | ICD-10-CM | POA: Diagnosis not present

## 2022-02-17 DIAGNOSIS — N2581 Secondary hyperparathyroidism of renal origin: Secondary | ICD-10-CM | POA: Diagnosis not present

## 2022-02-17 DIAGNOSIS — Z992 Dependence on renal dialysis: Secondary | ICD-10-CM | POA: Diagnosis not present

## 2022-02-17 MED ORDER — HYDROCODONE-ACETAMINOPHEN 5-325 MG PO TABS: 1.0000 | ORAL_TABLET | Freq: Four times a day (QID) | ORAL | 0 refills | Status: DC | PRN

## 2022-02-17 MED ORDER — KETOROLAC TROMETHAMINE 30 MG/ML IJ SOLN
INTRAMUSCULAR | Status: AC
  Filled 2022-02-17: qty 1

## 2022-02-17 MED ORDER — KETOROLAC TROMETHAMINE 30 MG/ML IJ SOLN
30.0000 mg | Freq: Once | INTRAMUSCULAR | Status: AC
  Administered 2022-02-17: 30 mg via INTRAMUSCULAR

## 2022-02-17 NOTE — ED Triage Notes (Signed)
C/o right. Shoulder pain x 2 days. No h/o falls or injury to his arm.

## 2022-02-17 NOTE — Discharge Instructions (Addendum)
Advised to take the Vicodin tablets as directed 1 every 6 hours as needed for pain.  Advised to use ice to help reduce the discomfort, 10 minutes on 20 minutes off, 4-5 times a day. Advised to follow-up with PCP or return to urgent care if symptoms fail to improve. Advised to watch for any unusual rash developing in that area of the pain or around the lower part of the right side of the chest over the next 3 to 4 days.

## 2022-02-17 NOTE — ED Provider Notes (Signed)
[place]    CSN: [id number] Arrival date & time: 02/17/22  0935      History   Chief Complaint Chief Complaint  Patient presents with   Back Pain   Arm Pain    HPI Victoria [name] is a 66 y.o. female.   66 year old female presents with moderately severe back pain.  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  Patient relates that she started with severe right mid back pain yesterday, localized, worse with movement, and worse with coughing.  Patient relates she has not done anything to aggravate her back, no heavy lifting, moving, reaching.  Patient relates she has not fallen or struck her back.  Patient relates that pain is localized right along her bra line on the right side, she is not having any shortness of breath or difficulty breathing, no chest pain, no fever or chills.  Patient did use heat yesterday which did not improve her pain, and she did also take Tylenol which did not help.  Patient relates she did have her shingles vaccine last year, and has not had a history of shingles.   Back Pain Arm Pain   Past Medical History:  Diagnosis Date   Arthritis    Chronic bronchitis ([place])    Complication of anesthesia ~ 2011   "they gave me a medicine that swolled me and mouth burning up" (27/01/1699)   Complication of anesthesia    slow to wake up   ESRD (end stage renal disease) on dialysis [facility]) Nephrologist-- dr deterding   ESRD due to HTN-; "M/W/F; [place]" (11/28/2015   GERD (gastroesophageal reflux disease)    no meds, diet controlled   History of acute pulmonary edema    2003   Hypertension    no longer on medications   Left patella fracture    Pneumonia    as a child   Seasonal allergies    Secondary hyperparathyroidism, renal ([place])    s/p  total parathyroidectomy  2014   Thyroid disease    Wears glasses    Wound dehiscence, surgical 11/28/2015    Patient Active Problem List    Diagnosis Date Noted   CKD (chronic kidney disease) requiring chronic dialysis ([place]) 01/13/2022   S/P total parathyroidectomy ([place]) 01/13/2022   Aortic atherosclerosis ([place]) 02/23/2021   Bilateral carpal tunnel syndrome 02/26/2019   Bilateral sensorineural hearing loss 01/16/2019   Deficiency of macronutrients 10/04/2013   Mechanical complication of other vascular device, implant, and graft 01/12/2013   Hyperlipidemia 10/25/2012   Thyroid nodule 03/22/2011   Hypertensive disorder 03/10/2011   Anemia in chronic kidney disease 06/28/2001   Iron deficiency anemia 06/28/2001    Past Surgical History:  Procedure Laterality Date   ANKLE FRACTURE SURGERY Right ~ 2005   "has pins in it"   APPLICATION OF WOUND VAC Left 1/74/9449   knee   APPLICATION OF WOUND VAC Left 11/28/2015   Procedure: APPLICATION OF WOUND VAC;  Surgeon: Rod Can, MD;  Location: [place];  Service: Orthopedics;  Laterality: Left;   ARTERIOVENOUS GRAFT PLACEMENT  03-25-2005   Right forearm  w/  multiple Revision's    CARDIOVASCULAR STRESS TEST  08-24-2012   abnormal nuclear study/  inferolateral and anteroseptal areas of scar,  no ischemia/  normal LV function and wall motion, ef 77%   DIALYSIS FISTULA CREATION  1992   left upper arm ---  w/  Multiple Revision's until  2006   ECTOPIC PREGNANCY SURGERY  1970's   FRACTURE SURGERY     HARDWARE REMOVAL Right 07/10/2020   Procedure: Removal of deep implant right fibula and tibia; steroid injection right ankle;  Surgeon: Wylene Simmer, MD;  Location: [place];  Service: Orthopedics;  Laterality: Right;   I & D EXTREMITY Left 11/28/2015   Procedure: IRRIGATION AND DEBRIDEMENT LEFT KNEE WOUND ;  Surgeon: Rod Can, MD;  Location: [place];  Service: Orthopedics;  Laterality: Left;   INCISION AND DRAINAGE OF WOUND Left 11/28/2015   knee   IR FLUORO GUIDE CV LINE LEFT  02/09/2022   ORIF PATELLA Left 10/31/2015   Procedure: OPEN REDUCTION INTERNAL (ORIF) FIXATION LEFT PATELLA;   Surgeon: Rod Can, MD;  Location: [place];  Service: Orthopedics;  Laterality: Left;   PATELLAR TENDON REPAIR  10/03/2012   Procedure: PATELLA TENDON REPAIR;  Surgeon: Marin Shutter, MD;  Location: [place];  Service: Orthopedics;  Laterality: Right;   PATELLECTOMY  10/03/2012   Procedure: PATELLECTOMY;  Surgeon: Marin Shutter, MD;  Location: [place];  Service: Orthopedics;  Laterality: Right;  RIGHT PARTIAL PATELLECTOMY AND PATELLA TENDON REPAIR   REVISION OF ARTERIOVENOUS GORETEX GRAFT  09/27/2012   Procedure: REVISION OF ARTERIOVENOUS GORETEX GRAFT;  Surgeon: Mal Misty, MD;  Location: [place];  Service: Vascular;  Laterality: Right;  1) Replacement of venous half of loop with 27mm Gortex graft  2) Excision of erroded pseudoaneurysm of graft with primary closure.   REVISION OF ARTERIOVENOUS GORETEX GRAFT Right 01/23/2013   Procedure: REVISION OF ARTERIOVENOUS GORETEX GRAFT;  Surgeon: Conrad Harrington, MD;  Location: [facility];  Service: Vascular;  Laterality: Right;  Using piece of 63mm x 20cm Gortex graft.    REVISION OF ARTERIOVENOUS GORETEX GRAFT Right 10/07/2015   Procedure: REVISION OF Right arm ARTERIOVENOUS GORETEX GRAFT;  Surgeon: Elam Dutch, MD;  Location: [place];  Service: Vascular;  Laterality: Right;   REVISION OF ARTERIOVENOUS GORETEX GRAFT Right 02/17/2016   Procedure: REVISION OF ARTERIOVENOUS GORETEX GRAFT;  Surgeon: Elam Dutch, MD;  Location: [place];  Service: Vascular;  Laterality: Right;   TOTAL ABDOMINAL HYSTERECTOMY  03-05-2005   w/  Right Ovarian Cystectomy   TOTAL PARATHYROIDECTOMY/  THYROID ISTHMUSECTOMY/  AUTOTRANSPLANTATION PARATHYROID TISSUE TO LEFT BRACHIORIADIALIS MUSCLE  02-18-2011   TRANSTHORACIC ECHOCARDIOGRAM  10-31-2012   mild LVH,  grade 1 diastolic dysfunction,  ef 55-65%/  mild MR/  trivial TR    OB History   No obstetric history on file.      Home Medications    Prior to Admission medications   Medication Sig Start Date End Date  Taking? Authorizing Provider  HYDROcodone-acetaminophen (NORCO/VICODIN) 5-325 MG tablet Take 1 tablet by mouth every 6 (six) hours as needed for severe pain. 02/17/22  Yes Nyoka Lint, PA-C  Acetaminophen (TYLENOL ARTHRITIS PAIN PO) Take by mouth.    [provider]  calcium acetate (PHOSLO) 667 MG capsule Take 2,001 mg by mouth 3 (three) times daily.  05/28/20   [provider]  pravastatin (PRAVACHOL) 20 MG tablet Take 1 tablet (20 mg total) by mouth 3 (three) times a week. 01/13/22   Nche, Charlene Brooke, NP    Family History Family History  Problem Relation Age of Onset   Healthy Mother    Hypertension Father    Kidney failure Father    Hypertension Sister    Thyroid disease Sister     Social History Social History  Tobacco Use   Smoking status: Never   Smokeless tobacco: Never  Vaping Use   Vaping Use: Never used  Substance Use Topics   Alcohol use: Yes    Alcohol/week: 0.0 standard drinks    Comment: occasionally drinks a few beers   Drug use: No     Allergies   Amlodipine, Cefazolin, Cephalosporins, and Lisinopril   Review of Systems Review of Systems  Musculoskeletal:  Positive for back pain.    Physical Exam Triage Vital Signs ED Triage Vitals [02/17/22 1032]  Enc Vitals Group     BP 124/72     Pulse Rate 71     Resp 16     Temp 97.6 F (36.4 C)     Temp Source Oral     SpO2 100 %     Weight      Height      Head Circumference      Peak Flow      Pain Score      Pain Loc      Pain Edu?      Excl. in [place]?    No data found.  Updated Vital Signs BP 124/72 (BP Location: Left Arm)   Pulse 71   Temp 97.6 F (36.4 C) (Oral)   Resp 16   LMP  (LMP Unknown)   SpO2 100%   Visual Acuity Right Eye Distance:   Left Eye Distance:   Bilateral Distance:    Right Eye Near:   Left Eye Near:    Bilateral Near:     Physical Exam Constitutional:      Appearance: Normal appearance.  Cardiovascular:     Rate and Rhythm: Normal rate  and regular rhythm.     Heart sounds: Normal heart sounds.  Pulmonary:     Effort: Pulmonary effort is normal.     Breath sounds: Normal breath sounds and air entry. No wheezing, rhonchi or rales.  Abdominal:     General: Abdomen is flat. Bowel sounds are normal.     Palpations: Abdomen is soft.     Tenderness: There is no abdominal tenderness.  Musculoskeletal:     Comments: Back: Pain is palpated at the right mid paraspinous area of the back adjacent to T8, there is no rash, there is no swelling. There is no tenderness on palpation along the right lateral and right anterior chest wall.  No rashes are noted.  Lymphadenopathy:     Cervical: No cervical adenopathy.  Neurological:     Mental Status: She is alert.     UC Treatments / Results  Labs (all labs ordered are listed, but only abnormal results are displayed) Labs Reviewed - No data to display  EKG   Radiology No results found.  Procedures Procedures (including critical care time)  Medications Ordered in UC Medications  ketorolac (TORADOL) 30 MG/ML injection 30 mg (has no administration in time range)    Initial Impression / Assessment and Plan / UC Course  I have reviewed the triage vital signs and the nursing notes.  Pertinent labs & imaging results that were available during my care of the patient were reviewed by me and considered in my medical decision making (see chart for details).    Plan:. 1.  Advised to use ice frequently, 10 minutes on 20 minutes off, 3-4 times a day, to help reduce the pain in the back. 2.  Advised to take the Vicodin tablets 1 every 6 hours to help reduce the back pain  and reduce the spasm. 3.  Advised to follow-up with PCP or return to urgent care if symptoms fail to improve. 4.  Advised to watch for any unusual rash that may develop in the area of the pain, if this occurs, and advised to get this checked at that time. Final Clinical Impressions(s) / UC Diagnoses   Final  diagnoses:  Acute right-sided thoracic back pain  Muscle spasm  Neuritis     Discharge Instructions      Advised to take the Vicodin tablets as directed 1 every 6 hours as needed for pain.  Advised to use ice to help reduce the discomfort, 10 minutes on 20 minutes off, 4-5 times a day. Advised to follow-up with PCP or return to urgent care if symptoms fail to improve. Advised to watch for any unusual rash developing in that area of the pain or around the lower part of the right side of the chest over the next 3 to 4 days.     ED Prescriptions     Medication Sig Dispense Auth. Provider   HYDROcodone-acetaminophen (NORCO/VICODIN) 5-325 MG tablet Take 1 tablet by mouth every 6 (six) hours as needed for severe pain. 12 tablet Nyoka Lint, PA-C      I have reviewed the PDMP during this encounter.   Nyoka Lint, PA-C 02/17/22 1120

## 2022-02-19 DIAGNOSIS — L299 Pruritus, unspecified: Secondary | ICD-10-CM | POA: Diagnosis not present

## 2022-02-19 DIAGNOSIS — Z992 Dependence on renal dialysis: Secondary | ICD-10-CM | POA: Diagnosis not present

## 2022-02-19 DIAGNOSIS — N186 End stage renal disease: Secondary | ICD-10-CM | POA: Diagnosis not present

## 2022-02-19 DIAGNOSIS — N2581 Secondary hyperparathyroidism of renal origin: Secondary | ICD-10-CM | POA: Diagnosis not present

## 2022-02-19 DIAGNOSIS — T8249XA Other complication of vascular dialysis catheter, initial encounter: Secondary | ICD-10-CM | POA: Diagnosis not present

## 2022-02-19 DIAGNOSIS — D688 Other specified coagulation defects: Secondary | ICD-10-CM | POA: Diagnosis not present

## 2022-02-19 DIAGNOSIS — D689 Coagulation defect, unspecified: Secondary | ICD-10-CM | POA: Diagnosis not present

## 2022-02-22 DIAGNOSIS — N186 End stage renal disease: Secondary | ICD-10-CM | POA: Diagnosis not present

## 2022-02-22 DIAGNOSIS — D688 Other specified coagulation defects: Secondary | ICD-10-CM | POA: Diagnosis not present

## 2022-02-22 DIAGNOSIS — L299 Pruritus, unspecified: Secondary | ICD-10-CM | POA: Diagnosis not present

## 2022-02-22 DIAGNOSIS — D689 Coagulation defect, unspecified: Secondary | ICD-10-CM | POA: Diagnosis not present

## 2022-02-22 DIAGNOSIS — T8249XA Other complication of vascular dialysis catheter, initial encounter: Secondary | ICD-10-CM | POA: Diagnosis not present

## 2022-02-22 DIAGNOSIS — Z992 Dependence on renal dialysis: Secondary | ICD-10-CM | POA: Diagnosis not present

## 2022-02-22 DIAGNOSIS — N2581 Secondary hyperparathyroidism of renal origin: Secondary | ICD-10-CM | POA: Diagnosis not present

## 2022-02-24 DIAGNOSIS — N2581 Secondary hyperparathyroidism of renal origin: Secondary | ICD-10-CM | POA: Diagnosis not present

## 2022-02-24 DIAGNOSIS — D688 Other specified coagulation defects: Secondary | ICD-10-CM | POA: Diagnosis not present

## 2022-02-24 DIAGNOSIS — L299 Pruritus, unspecified: Secondary | ICD-10-CM | POA: Diagnosis not present

## 2022-02-24 DIAGNOSIS — Z992 Dependence on renal dialysis: Secondary | ICD-10-CM | POA: Diagnosis not present

## 2022-02-24 DIAGNOSIS — T8249XA Other complication of vascular dialysis catheter, initial encounter: Secondary | ICD-10-CM | POA: Diagnosis not present

## 2022-02-24 DIAGNOSIS — N186 End stage renal disease: Secondary | ICD-10-CM | POA: Diagnosis not present

## 2022-02-24 DIAGNOSIS — D689 Coagulation defect, unspecified: Secondary | ICD-10-CM | POA: Diagnosis not present

## 2022-02-26 DIAGNOSIS — D689 Coagulation defect, unspecified: Secondary | ICD-10-CM | POA: Diagnosis not present

## 2022-02-26 DIAGNOSIS — D688 Other specified coagulation defects: Secondary | ICD-10-CM | POA: Diagnosis not present

## 2022-02-26 DIAGNOSIS — N186 End stage renal disease: Secondary | ICD-10-CM | POA: Diagnosis not present

## 2022-02-26 DIAGNOSIS — T8249XA Other complication of vascular dialysis catheter, initial encounter: Secondary | ICD-10-CM | POA: Diagnosis not present

## 2022-02-26 DIAGNOSIS — Z992 Dependence on renal dialysis: Secondary | ICD-10-CM | POA: Diagnosis not present

## 2022-02-26 DIAGNOSIS — N2581 Secondary hyperparathyroidism of renal origin: Secondary | ICD-10-CM | POA: Diagnosis not present

## 2022-02-26 DIAGNOSIS — L299 Pruritus, unspecified: Secondary | ICD-10-CM | POA: Diagnosis not present

## 2022-03-01 DIAGNOSIS — L299 Pruritus, unspecified: Secondary | ICD-10-CM | POA: Diagnosis not present

## 2022-03-01 DIAGNOSIS — N186 End stage renal disease: Secondary | ICD-10-CM | POA: Diagnosis not present

## 2022-03-01 DIAGNOSIS — N2581 Secondary hyperparathyroidism of renal origin: Secondary | ICD-10-CM | POA: Diagnosis not present

## 2022-03-01 DIAGNOSIS — T8249XA Other complication of vascular dialysis catheter, initial encounter: Secondary | ICD-10-CM | POA: Diagnosis not present

## 2022-03-01 DIAGNOSIS — Z992 Dependence on renal dialysis: Secondary | ICD-10-CM | POA: Diagnosis not present

## 2022-03-01 DIAGNOSIS — D689 Coagulation defect, unspecified: Secondary | ICD-10-CM | POA: Diagnosis not present

## 2022-03-01 DIAGNOSIS — D688 Other specified coagulation defects: Secondary | ICD-10-CM | POA: Diagnosis not present

## 2022-03-03 DIAGNOSIS — N2581 Secondary hyperparathyroidism of renal origin: Secondary | ICD-10-CM | POA: Diagnosis not present

## 2022-03-03 DIAGNOSIS — N186 End stage renal disease: Secondary | ICD-10-CM | POA: Diagnosis not present

## 2022-03-03 DIAGNOSIS — D689 Coagulation defect, unspecified: Secondary | ICD-10-CM | POA: Diagnosis not present

## 2022-03-03 DIAGNOSIS — Z992 Dependence on renal dialysis: Secondary | ICD-10-CM | POA: Diagnosis not present

## 2022-03-03 DIAGNOSIS — D688 Other specified coagulation defects: Secondary | ICD-10-CM | POA: Diagnosis not present

## 2022-03-03 DIAGNOSIS — L299 Pruritus, unspecified: Secondary | ICD-10-CM | POA: Diagnosis not present

## 2022-03-03 DIAGNOSIS — T8249XA Other complication of vascular dialysis catheter, initial encounter: Secondary | ICD-10-CM | POA: Diagnosis not present

## 2022-03-05 DIAGNOSIS — L299 Pruritus, unspecified: Secondary | ICD-10-CM | POA: Diagnosis not present

## 2022-03-05 DIAGNOSIS — I120 Hypertensive chronic kidney disease with stage 5 chronic kidney disease or end stage renal disease: Secondary | ICD-10-CM | POA: Diagnosis not present

## 2022-03-05 DIAGNOSIS — T8249XA Other complication of vascular dialysis catheter, initial encounter: Secondary | ICD-10-CM | POA: Diagnosis not present

## 2022-03-05 DIAGNOSIS — D689 Coagulation defect, unspecified: Secondary | ICD-10-CM | POA: Diagnosis not present

## 2022-03-05 DIAGNOSIS — Z992 Dependence on renal dialysis: Secondary | ICD-10-CM | POA: Diagnosis not present

## 2022-03-05 DIAGNOSIS — N186 End stage renal disease: Secondary | ICD-10-CM | POA: Diagnosis not present

## 2022-03-05 DIAGNOSIS — D688 Other specified coagulation defects: Secondary | ICD-10-CM | POA: Diagnosis not present

## 2022-03-05 DIAGNOSIS — N2581 Secondary hyperparathyroidism of renal origin: Secondary | ICD-10-CM | POA: Diagnosis not present

## 2022-03-08 DIAGNOSIS — N186 End stage renal disease: Secondary | ICD-10-CM | POA: Diagnosis not present

## 2022-03-08 DIAGNOSIS — T8249XA Other complication of vascular dialysis catheter, initial encounter: Secondary | ICD-10-CM | POA: Diagnosis not present

## 2022-03-08 DIAGNOSIS — L299 Pruritus, unspecified: Secondary | ICD-10-CM | POA: Diagnosis not present

## 2022-03-08 DIAGNOSIS — Z992 Dependence on renal dialysis: Secondary | ICD-10-CM | POA: Diagnosis not present

## 2022-03-08 DIAGNOSIS — N2581 Secondary hyperparathyroidism of renal origin: Secondary | ICD-10-CM | POA: Diagnosis not present

## 2022-03-08 DIAGNOSIS — D688 Other specified coagulation defects: Secondary | ICD-10-CM | POA: Diagnosis not present

## 2022-03-08 DIAGNOSIS — D689 Coagulation defect, unspecified: Secondary | ICD-10-CM | POA: Diagnosis not present

## 2022-03-10 ENCOUNTER — Other Ambulatory Visit: Payer: Self-pay | Admitting: *Deleted

## 2022-03-10 DIAGNOSIS — N186 End stage renal disease: Secondary | ICD-10-CM | POA: Diagnosis not present

## 2022-03-10 DIAGNOSIS — N2581 Secondary hyperparathyroidism of renal origin: Secondary | ICD-10-CM | POA: Diagnosis not present

## 2022-03-10 DIAGNOSIS — D689 Coagulation defect, unspecified: Secondary | ICD-10-CM | POA: Diagnosis not present

## 2022-03-10 DIAGNOSIS — Z992 Dependence on renal dialysis: Secondary | ICD-10-CM | POA: Diagnosis not present

## 2022-03-10 DIAGNOSIS — D688 Other specified coagulation defects: Secondary | ICD-10-CM | POA: Diagnosis not present

## 2022-03-10 DIAGNOSIS — T8249XA Other complication of vascular dialysis catheter, initial encounter: Secondary | ICD-10-CM | POA: Diagnosis not present

## 2022-03-10 DIAGNOSIS — L299 Pruritus, unspecified: Secondary | ICD-10-CM | POA: Diagnosis not present

## 2022-03-12 DIAGNOSIS — N186 End stage renal disease: Secondary | ICD-10-CM | POA: Diagnosis not present

## 2022-03-12 DIAGNOSIS — D688 Other specified coagulation defects: Secondary | ICD-10-CM | POA: Diagnosis not present

## 2022-03-12 DIAGNOSIS — Z992 Dependence on renal dialysis: Secondary | ICD-10-CM | POA: Diagnosis not present

## 2022-03-12 DIAGNOSIS — N2581 Secondary hyperparathyroidism of renal origin: Secondary | ICD-10-CM | POA: Diagnosis not present

## 2022-03-12 DIAGNOSIS — D689 Coagulation defect, unspecified: Secondary | ICD-10-CM | POA: Diagnosis not present

## 2022-03-12 DIAGNOSIS — T8249XA Other complication of vascular dialysis catheter, initial encounter: Secondary | ICD-10-CM | POA: Diagnosis not present

## 2022-03-12 DIAGNOSIS — L299 Pruritus, unspecified: Secondary | ICD-10-CM | POA: Diagnosis not present

## 2022-03-15 DIAGNOSIS — N186 End stage renal disease: Secondary | ICD-10-CM | POA: Diagnosis not present

## 2022-03-15 DIAGNOSIS — Z992 Dependence on renal dialysis: Secondary | ICD-10-CM | POA: Diagnosis not present

## 2022-03-15 DIAGNOSIS — L299 Pruritus, unspecified: Secondary | ICD-10-CM | POA: Diagnosis not present

## 2022-03-15 DIAGNOSIS — N2581 Secondary hyperparathyroidism of renal origin: Secondary | ICD-10-CM | POA: Diagnosis not present

## 2022-03-15 DIAGNOSIS — T8249XA Other complication of vascular dialysis catheter, initial encounter: Secondary | ICD-10-CM | POA: Diagnosis not present

## 2022-03-15 DIAGNOSIS — D688 Other specified coagulation defects: Secondary | ICD-10-CM | POA: Diagnosis not present

## 2022-03-15 DIAGNOSIS — D689 Coagulation defect, unspecified: Secondary | ICD-10-CM | POA: Diagnosis not present

## 2022-03-17 DIAGNOSIS — N2581 Secondary hyperparathyroidism of renal origin: Secondary | ICD-10-CM | POA: Diagnosis not present

## 2022-03-17 DIAGNOSIS — N186 End stage renal disease: Secondary | ICD-10-CM | POA: Diagnosis not present

## 2022-03-17 DIAGNOSIS — D688 Other specified coagulation defects: Secondary | ICD-10-CM | POA: Diagnosis not present

## 2022-03-17 DIAGNOSIS — Z992 Dependence on renal dialysis: Secondary | ICD-10-CM | POA: Diagnosis not present

## 2022-03-17 DIAGNOSIS — D689 Coagulation defect, unspecified: Secondary | ICD-10-CM | POA: Diagnosis not present

## 2022-03-17 DIAGNOSIS — T8249XA Other complication of vascular dialysis catheter, initial encounter: Secondary | ICD-10-CM | POA: Diagnosis not present

## 2022-03-17 DIAGNOSIS — L299 Pruritus, unspecified: Secondary | ICD-10-CM | POA: Diagnosis not present

## 2022-03-18 ENCOUNTER — Ambulatory Visit (HOSPITAL_COMMUNITY)
Admission: RE | Admit: 2022-03-18 | Discharge: 2022-03-18 | Disposition: A | Discharge status: Home / Self Care | Payer: Medicare Other | Source: Ambulatory Visit | Attending: Vascular Surgery | Admitting: Vascular Surgery

## 2022-03-18 ENCOUNTER — Ambulatory Visit (INDEPENDENT_AMBULATORY_CARE_PROVIDER_SITE_OTHER): Payer: Medicare Other | Admitting: Vascular Surgery

## 2022-03-18 ENCOUNTER — Ambulatory Visit (INDEPENDENT_AMBULATORY_CARE_PROVIDER_SITE_OTHER)
Admission: RE | Admit: 2022-03-18 | Discharge: 2022-03-18 | Disposition: A | Discharge status: Home / Self Care | Payer: Medicare Other | Source: Ambulatory Visit | Attending: Vascular Surgery | Admitting: Vascular Surgery

## 2022-03-18 ENCOUNTER — Other Ambulatory Visit: Payer: Self-pay

## 2022-03-18 ENCOUNTER — Encounter: Payer: Self-pay | Admitting: Vascular Surgery

## 2022-03-18 VITALS — BP 118/75 | HR 80 | Temp 98.0°F | Resp 20 | Ht 63.5 in | Wt 168.0 lb

## 2022-03-18 DIAGNOSIS — Z992 Dependence on renal dialysis: Secondary | ICD-10-CM

## 2022-03-18 DIAGNOSIS — N186 End stage renal disease: Secondary | ICD-10-CM | POA: Diagnosis not present

## 2022-03-18 NOTE — Progress Notes (Signed)
ASSESSMENT & PLAN   END-STAGE RENAL DISEASE: After review of the history and noninvasive studies today, I think her next logical place for access would be a right upper arm graft.  This is the only remaining option in the upper extremities.  Given her obesity would be best to stay out of the thigh if possible.  She has triphasic waveforms in the right hand and no usable vein so will require a graft.  I have discussed the indications for the procedure and the potential complications with the patient and she is agreeable to proceed.  REASON FOR CONSULT:    To evaluate for hemodialysis access.  The consult is requested by Dr. [name].  HPI:   Victoria [name] is a 66 y.o. female who was referred for evaluation for new hemodialysis access.  The patient currently has a catheter.  I have reviewed the records from the referring office.  The patient has had previous access in both arms.  This patient has had access in the left arm and the forearm and upper arm.  On the right side she had a previous forearm graft.  She has no usable vein in either arm based on her vein mapping.  She denies any recent uremic symptoms.  Past Medical History:  Diagnosis Date   Arthritis    Chronic bronchitis ([place])    Complication of anesthesia ~ 2011   "they gave me a medicine that swolled me and mouth burning up" (78/10/9560)   Complication of anesthesia    slow to wake up   ESRD (end stage renal disease) on dialysis [facility]) Nephrologist-- dr deterding   ESRD due to HTN-; "M/W/F; [place]" (11/28/2015   GERD (gastroesophageal reflux disease)    no meds, diet controlled   History of acute pulmonary edema    2003   Hypertension    no longer on medications   Left patella fracture    Pneumonia    as a child   Seasonal allergies    Secondary hyperparathyroidism, renal ([place])    s/p  total parathyroidectomy  2014   Thyroid disease    Wears glasses    Wound dehiscence, surgical 11/28/2015    Family  History  Problem Relation Age of Onset   Healthy Mother    Hypertension Father    Kidney failure Father    Hypertension Sister    Thyroid disease Sister     SOCIAL HISTORY: Social History   Tobacco Use   Smoking status: Never    Passive exposure: Never   Smokeless tobacco: Never  Substance Use Topics   Alcohol use: Yes    Alcohol/week: 0.0 standard drinks of alcohol    Comment: occasionally drinks a few beers    Allergies  Allergen Reactions   Amlodipine Swelling   Cefazolin     Other reaction(s): Unknown  Pt received Ancef on 10/21.2021 without issue   Cephalosporins Anaphylaxis    Pt received Ancef on 07/10/2020 without issue   Lisinopril Swelling and Other (See Comments)    Other reaction(s): Other (See Comments) "made my chest hurt" "made my chest hurt"    Current Outpatient Medications  Medication Sig Dispense Refill   Acetaminophen (TYLENOL ARTHRITIS PAIN PO) Take by mouth.     calcium acetate (PHOSLO) 667 MG capsule Take 2,001 mg by mouth 3 (three) times daily.      HYDROcodone-acetaminophen (NORCO/VICODIN) 5-325 MG tablet Take 1 tablet by mouth every 6 (six) hours as needed for severe pain. (Patient  not taking: Reported on 03/18/2022) 12 tablet 0   pravastatin (PRAVACHOL) 20 MG tablet Take 1 tablet (20 mg total) by mouth 3 (three) times a week. (Patient not taking: Reported on 03/18/2022) 12 tablet 5   No current facility-administered medications for this visit.    REVIEW OF SYSTEMS:  [X]  denotes positive finding, [ ]  denotes negative finding Cardiac  Comments:  Chest pain or chest pressure:    Shortness of breath upon exertion:    Short of breath when lying flat:    Irregular heart rhythm:        Vascular    Pain in calf, thigh, or hip brought on by ambulation:    Pain in feet at night that wakes you up from your sleep:     Blood clot in your veins:    Leg swelling:         Pulmonary    Oxygen at home:    Productive cough:     Wheezing:          Neurologic    Sudden weakness in arms or legs:     Sudden numbness in arms or legs:     Sudden onset of difficulty speaking or slurred speech:    Temporary loss of vision in one eye:     Problems with dizziness:         Gastrointestinal    Blood in stool:     Vomited blood:         Genitourinary    Burning when urinating:     Blood in urine:        Psychiatric    Major depression:         Hematologic    Bleeding problems:    Problems with blood clotting too easily:        Skin    Rashes or ulcers:        Constitutional    Fever or chills:    -  PHYSICAL EXAM:   Vitals:   03/18/22 0912  BP: 118/75  Pulse: 80  Resp: 20  Temp: 98 F (36.7 C)  TempSrc: Temporal  SpO2: 96%  Weight: 168 lb (76.2 kg)  Height: 5' 3.5" (1.613 m)   Body mass index is 29.29 kg/m. GENERAL: The patient is a well-nourished female, in no acute distress. The vital signs are documented above. CARDIAC: There is a regular rate and rhythm.  VASCULAR: She has palpable radial pulses bilaterally. PULMONARY: There is good air exchange bilaterally without wheezing or rales. MUSCULOSKELETAL: There are no major deformities. NEUROLOGIC: No focal weakness or paresthesias are detected. SKIN: There are no ulcers or rashes noted. PSYCHIATRIC: The patient has a normal affect.  DATA:    VEIN MAP: I have reviewed her bilateral upper extremity vein mapping independently interpreted this today.  There is no usable vein in the upper arm or forearms bilaterally.  ARTERIAL DUPLEX: I have independently interpreted her arterial duplex scan today.  On the right side she has a triphasic radial and ulnar signal.  The brachial artery measures 4.7 mm in diameter.  On the left side she has a triphasic radial and ulnar waveform.  The brachial artery measures 5.8 mm in diameter.  Deitra Mayo [facility]

## 2022-03-18 NOTE — H&P (View-Only) (Signed)
ASSESSMENT & PLAN   END-STAGE RENAL DISEASE: After review of the history and noninvasive studies today, I think her next logical place for access would be a right upper arm graft.  This is the only remaining option in the upper extremities.  Given her obesity would be best to stay out of the thigh if possible.  She has triphasic waveforms in the right hand and no usable vein so will require a graft.  I have discussed the indications for the procedure and the potential complications with the patient and she is agreeable to proceed.  REASON FOR CONSULT:    To evaluate for hemodialysis access.  The consult is requested by Dr. [name].  HPI:   Victoria [name] is a 66 y.o. female who was referred for evaluation for new hemodialysis access.  The patient currently has a catheter.  I have reviewed the records from the referring office.  The patient has had previous access in both arms.  This patient has had access in the left arm and the forearm and upper arm.  On the right side she had a previous forearm graft.  She has no usable vein in either arm based on her vein mapping.  She denies any recent uremic symptoms.  Past Medical History:  Diagnosis Date   Arthritis    Chronic bronchitis ([place])    Complication of anesthesia ~ 2011   "they gave me a medicine that swolled me and mouth burning up" (08/23/5808)   Complication of anesthesia    slow to wake up   ESRD (end stage renal disease) on dialysis [facility]) Nephrologist-- dr deterding   ESRD due to HTN-; "M/W/F; [place]" (11/28/2015   GERD (gastroesophageal reflux disease)    no meds, diet controlled   History of acute pulmonary edema    2003   Hypertension    no longer on medications   Left patella fracture    Pneumonia    as a child   Seasonal allergies    Secondary hyperparathyroidism, renal ([place])    s/p  total parathyroidectomy  2014   Thyroid disease    Wears glasses    Wound dehiscence, surgical 11/28/2015    Family  History  Problem Relation Age of Onset   Healthy Mother    Hypertension Father    Kidney failure Father    Hypertension Sister    Thyroid disease Sister     SOCIAL HISTORY: Social History   Tobacco Use   Smoking status: Never    Passive exposure: Never   Smokeless tobacco: Never  Substance Use Topics   Alcohol use: Yes    Alcohol/week: 0.0 standard drinks of alcohol    Comment: occasionally drinks a few beers    Allergies  Allergen Reactions   Amlodipine Swelling   Cefazolin     Other reaction(s): Unknown  Pt received Ancef on 10/21.2021 without issue   Cephalosporins Anaphylaxis    Pt received Ancef on 07/10/2020 without issue   Lisinopril Swelling and Other (See Comments)    Other reaction(s): Other (See Comments) "made my chest hurt" "made my chest hurt"    Current Outpatient Medications  Medication Sig Dispense Refill   Acetaminophen (TYLENOL ARTHRITIS PAIN PO) Take by mouth.     calcium acetate (PHOSLO) 667 MG capsule Take 2,001 mg by mouth 3 (three) times daily.      HYDROcodone-acetaminophen (NORCO/VICODIN) 5-325 MG tablet Take 1 tablet by mouth every 6 (six) hours as needed for severe pain. (Patient  not taking: Reported on 03/18/2022) 12 tablet 0   pravastatin (PRAVACHOL) 20 MG tablet Take 1 tablet (20 mg total) by mouth 3 (three) times a week. (Patient not taking: Reported on 03/18/2022) 12 tablet 5   No current facility-administered medications for this visit.    REVIEW OF SYSTEMS:  [X]  denotes positive finding, [ ]  denotes negative finding Cardiac  Comments:  Chest pain or chest pressure:    Shortness of breath upon exertion:    Short of breath when lying flat:    Irregular heart rhythm:        Vascular    Pain in calf, thigh, or hip brought on by ambulation:    Pain in feet at night that wakes you up from your sleep:     Blood clot in your veins:    Leg swelling:         Pulmonary    Oxygen at home:    Productive cough:     Wheezing:          Neurologic    Sudden weakness in arms or legs:     Sudden numbness in arms or legs:     Sudden onset of difficulty speaking or slurred speech:    Temporary loss of vision in one eye:     Problems with dizziness:         Gastrointestinal    Blood in stool:     Vomited blood:         Genitourinary    Burning when urinating:     Blood in urine:        Psychiatric    Major depression:         Hematologic    Bleeding problems:    Problems with blood clotting too easily:        Skin    Rashes or ulcers:        Constitutional    Fever or chills:    -  PHYSICAL EXAM:   Vitals:   03/18/22 0912  BP: 118/75  Pulse: 80  Resp: 20  Temp: 98 F (36.7 C)  TempSrc: Temporal  SpO2: 96%  Weight: 168 lb (76.2 kg)  Height: 5' 3.5" (1.613 m)   Body mass index is 29.29 kg/m. GENERAL: The patient is a well-nourished female, in no acute distress. The vital signs are documented above. CARDIAC: There is a regular rate and rhythm.  VASCULAR: She has palpable radial pulses bilaterally. PULMONARY: There is good air exchange bilaterally without wheezing or rales. MUSCULOSKELETAL: There are no major deformities. NEUROLOGIC: No focal weakness or paresthesias are detected. SKIN: There are no ulcers or rashes noted. PSYCHIATRIC: The patient has a normal affect.  DATA:    VEIN MAP: I have reviewed her bilateral upper extremity vein mapping independently interpreted this today.  There is no usable vein in the upper arm or forearms bilaterally.  ARTERIAL DUPLEX: I have independently interpreted her arterial duplex scan today.  On the right side she has a triphasic radial and ulnar signal.  The brachial artery measures 4.7 mm in diameter.  On the left side she has a triphasic radial and ulnar waveform.  The brachial artery measures 5.8 mm in diameter.  Deitra Mayo [facility]

## 2022-03-19 DIAGNOSIS — N2581 Secondary hyperparathyroidism of renal origin: Secondary | ICD-10-CM | POA: Diagnosis not present

## 2022-03-19 DIAGNOSIS — Z992 Dependence on renal dialysis: Secondary | ICD-10-CM | POA: Diagnosis not present

## 2022-03-19 DIAGNOSIS — T8249XA Other complication of vascular dialysis catheter, initial encounter: Secondary | ICD-10-CM | POA: Diagnosis not present

## 2022-03-19 DIAGNOSIS — I120 Hypertensive chronic kidney disease with stage 5 chronic kidney disease or end stage renal disease: Secondary | ICD-10-CM | POA: Diagnosis not present

## 2022-03-19 DIAGNOSIS — N186 End stage renal disease: Secondary | ICD-10-CM | POA: Diagnosis not present

## 2022-03-19 DIAGNOSIS — D689 Coagulation defect, unspecified: Secondary | ICD-10-CM | POA: Diagnosis not present

## 2022-03-19 DIAGNOSIS — L299 Pruritus, unspecified: Secondary | ICD-10-CM | POA: Diagnosis not present

## 2022-03-19 DIAGNOSIS — D688 Other specified coagulation defects: Secondary | ICD-10-CM | POA: Diagnosis not present

## 2022-03-22 DIAGNOSIS — Z992 Dependence on renal dialysis: Secondary | ICD-10-CM | POA: Diagnosis not present

## 2022-03-22 DIAGNOSIS — D688 Other specified coagulation defects: Secondary | ICD-10-CM | POA: Diagnosis not present

## 2022-03-22 DIAGNOSIS — N186 End stage renal disease: Secondary | ICD-10-CM | POA: Diagnosis not present

## 2022-03-22 DIAGNOSIS — N2581 Secondary hyperparathyroidism of renal origin: Secondary | ICD-10-CM | POA: Diagnosis not present

## 2022-03-22 DIAGNOSIS — T8249XA Other complication of vascular dialysis catheter, initial encounter: Secondary | ICD-10-CM | POA: Diagnosis not present

## 2022-03-22 DIAGNOSIS — D689 Coagulation defect, unspecified: Secondary | ICD-10-CM | POA: Diagnosis not present

## 2022-03-22 DIAGNOSIS — L299 Pruritus, unspecified: Secondary | ICD-10-CM | POA: Diagnosis not present

## 2022-03-24 DIAGNOSIS — N2581 Secondary hyperparathyroidism of renal origin: Secondary | ICD-10-CM | POA: Diagnosis not present

## 2022-03-24 DIAGNOSIS — D689 Coagulation defect, unspecified: Secondary | ICD-10-CM | POA: Diagnosis not present

## 2022-03-24 DIAGNOSIS — D688 Other specified coagulation defects: Secondary | ICD-10-CM | POA: Diagnosis not present

## 2022-03-24 DIAGNOSIS — T8249XA Other complication of vascular dialysis catheter, initial encounter: Secondary | ICD-10-CM | POA: Diagnosis not present

## 2022-03-24 DIAGNOSIS — N186 End stage renal disease: Secondary | ICD-10-CM | POA: Diagnosis not present

## 2022-03-24 DIAGNOSIS — L299 Pruritus, unspecified: Secondary | ICD-10-CM | POA: Diagnosis not present

## 2022-03-24 DIAGNOSIS — Z992 Dependence on renal dialysis: Secondary | ICD-10-CM | POA: Diagnosis not present

## 2022-03-26 DIAGNOSIS — Z992 Dependence on renal dialysis: Secondary | ICD-10-CM | POA: Diagnosis not present

## 2022-03-26 DIAGNOSIS — D688 Other specified coagulation defects: Secondary | ICD-10-CM | POA: Diagnosis not present

## 2022-03-26 DIAGNOSIS — L299 Pruritus, unspecified: Secondary | ICD-10-CM | POA: Diagnosis not present

## 2022-03-26 DIAGNOSIS — D689 Coagulation defect, unspecified: Secondary | ICD-10-CM | POA: Diagnosis not present

## 2022-03-26 DIAGNOSIS — T8249XA Other complication of vascular dialysis catheter, initial encounter: Secondary | ICD-10-CM | POA: Diagnosis not present

## 2022-03-26 DIAGNOSIS — N186 End stage renal disease: Secondary | ICD-10-CM | POA: Diagnosis not present

## 2022-03-26 DIAGNOSIS — N2581 Secondary hyperparathyroidism of renal origin: Secondary | ICD-10-CM | POA: Diagnosis not present

## 2022-03-29 DIAGNOSIS — N186 End stage renal disease: Secondary | ICD-10-CM | POA: Diagnosis not present

## 2022-03-29 DIAGNOSIS — D689 Coagulation defect, unspecified: Secondary | ICD-10-CM | POA: Diagnosis not present

## 2022-03-29 DIAGNOSIS — L299 Pruritus, unspecified: Secondary | ICD-10-CM | POA: Diagnosis not present

## 2022-03-29 DIAGNOSIS — Z992 Dependence on renal dialysis: Secondary | ICD-10-CM | POA: Diagnosis not present

## 2022-03-29 DIAGNOSIS — T8249XA Other complication of vascular dialysis catheter, initial encounter: Secondary | ICD-10-CM | POA: Diagnosis not present

## 2022-03-29 DIAGNOSIS — D688 Other specified coagulation defects: Secondary | ICD-10-CM | POA: Diagnosis not present

## 2022-03-29 DIAGNOSIS — N2581 Secondary hyperparathyroidism of renal origin: Secondary | ICD-10-CM | POA: Diagnosis not present

## 2022-03-31 DIAGNOSIS — T8249XA Other complication of vascular dialysis catheter, initial encounter: Secondary | ICD-10-CM | POA: Diagnosis not present

## 2022-03-31 DIAGNOSIS — N186 End stage renal disease: Secondary | ICD-10-CM | POA: Diagnosis not present

## 2022-03-31 DIAGNOSIS — L299 Pruritus, unspecified: Secondary | ICD-10-CM | POA: Diagnosis not present

## 2022-03-31 DIAGNOSIS — N2581 Secondary hyperparathyroidism of renal origin: Secondary | ICD-10-CM | POA: Diagnosis not present

## 2022-03-31 DIAGNOSIS — D689 Coagulation defect, unspecified: Secondary | ICD-10-CM | POA: Diagnosis not present

## 2022-03-31 DIAGNOSIS — Z992 Dependence on renal dialysis: Secondary | ICD-10-CM | POA: Diagnosis not present

## 2022-03-31 DIAGNOSIS — D688 Other specified coagulation defects: Secondary | ICD-10-CM | POA: Diagnosis not present

## 2022-04-02 DIAGNOSIS — N186 End stage renal disease: Secondary | ICD-10-CM | POA: Diagnosis not present

## 2022-04-02 DIAGNOSIS — T8249XA Other complication of vascular dialysis catheter, initial encounter: Secondary | ICD-10-CM | POA: Diagnosis not present

## 2022-04-02 DIAGNOSIS — Z992 Dependence on renal dialysis: Secondary | ICD-10-CM | POA: Diagnosis not present

## 2022-04-02 DIAGNOSIS — D688 Other specified coagulation defects: Secondary | ICD-10-CM | POA: Diagnosis not present

## 2022-04-02 DIAGNOSIS — D689 Coagulation defect, unspecified: Secondary | ICD-10-CM | POA: Diagnosis not present

## 2022-04-02 DIAGNOSIS — N2581 Secondary hyperparathyroidism of renal origin: Secondary | ICD-10-CM | POA: Diagnosis not present

## 2022-04-02 DIAGNOSIS — L299 Pruritus, unspecified: Secondary | ICD-10-CM | POA: Diagnosis not present

## 2022-04-05 DIAGNOSIS — Z992 Dependence on renal dialysis: Secondary | ICD-10-CM | POA: Diagnosis not present

## 2022-04-05 DIAGNOSIS — N2581 Secondary hyperparathyroidism of renal origin: Secondary | ICD-10-CM | POA: Diagnosis not present

## 2022-04-05 DIAGNOSIS — L299 Pruritus, unspecified: Secondary | ICD-10-CM | POA: Diagnosis not present

## 2022-04-05 DIAGNOSIS — D688 Other specified coagulation defects: Secondary | ICD-10-CM | POA: Diagnosis not present

## 2022-04-05 DIAGNOSIS — T8249XA Other complication of vascular dialysis catheter, initial encounter: Secondary | ICD-10-CM | POA: Diagnosis not present

## 2022-04-05 DIAGNOSIS — D689 Coagulation defect, unspecified: Secondary | ICD-10-CM | POA: Diagnosis not present

## 2022-04-05 DIAGNOSIS — N186 End stage renal disease: Secondary | ICD-10-CM | POA: Diagnosis not present

## 2022-04-07 DIAGNOSIS — N2581 Secondary hyperparathyroidism of renal origin: Secondary | ICD-10-CM | POA: Diagnosis not present

## 2022-04-07 DIAGNOSIS — L299 Pruritus, unspecified: Secondary | ICD-10-CM | POA: Diagnosis not present

## 2022-04-07 DIAGNOSIS — N186 End stage renal disease: Secondary | ICD-10-CM | POA: Diagnosis not present

## 2022-04-07 DIAGNOSIS — Z992 Dependence on renal dialysis: Secondary | ICD-10-CM | POA: Diagnosis not present

## 2022-04-07 DIAGNOSIS — T8249XA Other complication of vascular dialysis catheter, initial encounter: Secondary | ICD-10-CM | POA: Diagnosis not present

## 2022-04-07 DIAGNOSIS — D688 Other specified coagulation defects: Secondary | ICD-10-CM | POA: Diagnosis not present

## 2022-04-07 DIAGNOSIS — D689 Coagulation defect, unspecified: Secondary | ICD-10-CM | POA: Diagnosis not present

## 2022-04-09 DIAGNOSIS — T8249XA Other complication of vascular dialysis catheter, initial encounter: Secondary | ICD-10-CM | POA: Diagnosis not present

## 2022-04-09 DIAGNOSIS — Z992 Dependence on renal dialysis: Secondary | ICD-10-CM | POA: Diagnosis not present

## 2022-04-09 DIAGNOSIS — D689 Coagulation defect, unspecified: Secondary | ICD-10-CM | POA: Diagnosis not present

## 2022-04-09 DIAGNOSIS — N2581 Secondary hyperparathyroidism of renal origin: Secondary | ICD-10-CM | POA: Diagnosis not present

## 2022-04-09 DIAGNOSIS — N186 End stage renal disease: Secondary | ICD-10-CM | POA: Diagnosis not present

## 2022-04-09 DIAGNOSIS — D688 Other specified coagulation defects: Secondary | ICD-10-CM | POA: Diagnosis not present

## 2022-04-09 DIAGNOSIS — L299 Pruritus, unspecified: Secondary | ICD-10-CM | POA: Diagnosis not present

## 2022-04-12 ENCOUNTER — Encounter (HOSPITAL_COMMUNITY): Payer: Self-pay | Admitting: Vascular Surgery

## 2022-04-12 ENCOUNTER — Other Ambulatory Visit: Payer: Self-pay

## 2022-04-12 DIAGNOSIS — N186 End stage renal disease: Secondary | ICD-10-CM | POA: Diagnosis not present

## 2022-04-12 DIAGNOSIS — Z992 Dependence on renal dialysis: Secondary | ICD-10-CM | POA: Diagnosis not present

## 2022-04-12 DIAGNOSIS — N2581 Secondary hyperparathyroidism of renal origin: Secondary | ICD-10-CM | POA: Diagnosis not present

## 2022-04-12 DIAGNOSIS — D688 Other specified coagulation defects: Secondary | ICD-10-CM | POA: Diagnosis not present

## 2022-04-12 DIAGNOSIS — L299 Pruritus, unspecified: Secondary | ICD-10-CM | POA: Diagnosis not present

## 2022-04-12 DIAGNOSIS — D689 Coagulation defect, unspecified: Secondary | ICD-10-CM | POA: Diagnosis not present

## 2022-04-12 DIAGNOSIS — T8249XA Other complication of vascular dialysis catheter, initial encounter: Secondary | ICD-10-CM | POA: Diagnosis not present

## 2022-04-12 NOTE — Pre-Procedure Instructions (Signed)
SDW CALL  Patient was given pre-op instructions over the phone. The opportunity was given for the patient to ask questions. No further questions asked. Patient verbalized understanding of instructions given.   PCP - Nche, Charlene Brooke, NP Cardiologist - denies  PPM/ICD - denies Chest x-ray -  EKG - DOS Stress Test - 08/24/12 ECHO - 07/21/17 Cardiac Cath - denies  Sleep Study - denies   Blood Thinner Instructions:N/A Aspirin Instructions:N/A  ERAS Protcol -NPO order   COVID TEST- N/A   Anesthesia review: no  Patient denies shortness of breath, fever, cough and chest pain over the phone call    Surgical Instructions    Your procedure is scheduled on Tuesday, July 25  Report to [facility] Main Entrance "A" at 5:30 A.M., then check in with the Admitting office.  Call this number if you have problems the morning of surgery:  [phone]    Remember:  Do not eat or drink after midnight the night before your surgery    Take these medicines the morning of surgery with A SIP OF WATER: NONE    As of today, STOP taking any Aspirin (unless otherwise instructed by your surgeon) Aleve, Naproxen, Ibuprofen, Motrin, Advil, Goody's, BC's, all herbal medications, fish oil, and all vitamins.  [facility] is not responsible for any belongings or valuables.   Contacts, glasses, hearing aids, dentures or partials may not be worn into surgery, please bring cases for these belongings   Patients discharged the day of surgery will not be allowed to drive home, and someone needs to stay with them for 24 hours.   SURGICAL WAITING ROOM VISITATION 1 visitor is allowed in pre-op area with patient.  Patients having surgery or a procedure in a hospital may have two support people in the waiting room. Children under the age of 7 must have an adult with them who is not the patient. They may stay in the waiting area during the procedure and may switch out with other visitors. If the  patient needs to stay at the hospital during part of their recovery, the visitor guidelines for inpatient rooms apply.  Please refer to the [facility] website for the visitor guidelines for Inpatients (after your surgery is over and you are in a regular room).     Special instructions:    Oral Hygiene is also important to reduce your risk of infection.  Remember - BRUSH YOUR TEETH THE MORNING OF SURGERY WITH YOUR REGULAR TOOTHPASTE   Day of Surgery:  Take a shower the day of or night before with antibacterial soap. Wear Clean/Comfortable clothing the morning of surgery Do not apply any deodorants/lotions.   Do not wear jewelry or makeup Do not wear lotions, powders, perfumes/colognes, or deodorant. Do not shave 48 hours prior to surgery.  Men may shave face and neck. Do not bring valuables to the hospital. Do not wear nail polish, gel polish, artificial nails, or any other type of covering on natural nails (fingers and toes) If you have artificial nails or gel coating that need to be removed by a nail salon, please have this removed prior to surgery. Artificial nails or gel coating may interfere with anesthesia's ability to adequately monitor your vital signs. Remember to brush your teeth WITH YOUR REGULAR TOOTHPASTE.

## 2022-04-13 ENCOUNTER — Ambulatory Visit (HOSPITAL_COMMUNITY): Payer: Medicare Other

## 2022-04-13 ENCOUNTER — Ambulatory Visit (HOSPITAL_BASED_OUTPATIENT_CLINIC_OR_DEPARTMENT_OTHER): Payer: Medicare Other

## 2022-04-13 ENCOUNTER — Encounter (HOSPITAL_COMMUNITY): Payer: Self-pay | Admitting: Vascular Surgery

## 2022-04-13 ENCOUNTER — Other Ambulatory Visit: Payer: Self-pay

## 2022-04-13 ENCOUNTER — Ambulatory Visit (HOSPITAL_COMMUNITY)
Admission: RE | Admit: 2022-04-13 | Discharge: 2022-04-13 | Disposition: A | Discharge status: Home / Self Care | Payer: Medicare Other | Attending: Vascular Surgery | Admitting: Vascular Surgery

## 2022-04-13 ENCOUNTER — Encounter (HOSPITAL_COMMUNITY)
Admission: RE | Disposition: A | Discharge status: Home / Self Care | Payer: Self-pay | Source: Home / Self Care | Attending: Vascular Surgery

## 2022-04-13 DIAGNOSIS — Z992 Dependence on renal dialysis: Secondary | ICD-10-CM

## 2022-04-13 DIAGNOSIS — N185 Chronic kidney disease, stage 5: Secondary | ICD-10-CM | POA: Diagnosis not present

## 2022-04-13 DIAGNOSIS — I120 Hypertensive chronic kidney disease with stage 5 chronic kidney disease or end stage renal disease: Secondary | ICD-10-CM | POA: Diagnosis not present

## 2022-04-13 DIAGNOSIS — D631 Anemia in chronic kidney disease: Secondary | ICD-10-CM

## 2022-04-13 DIAGNOSIS — N186 End stage renal disease: Secondary | ICD-10-CM | POA: Insufficient documentation

## 2022-04-13 HISTORY — PX: AV FISTULA PLACEMENT: SHX1204

## 2022-04-13 LAB — POCT I-STAT, CHEM 8
BUN: 40 mg/dL — ABNORMAL HIGH (ref 8–23)
Calcium, Ion: 0.99 mmol/L — ABNORMAL LOW (ref 1.15–1.40)
Chloride: 103 mmol/L (ref 98–111)
Creatinine, Ser: 11 mg/dL — ABNORMAL HIGH (ref 0.44–1.00)
Glucose, Bld: 86 mg/dL (ref 70–99)
HCT: 45 % (ref 36.0–46.0)
Hemoglobin: 15.3 g/dL — ABNORMAL HIGH (ref 12.0–15.0)
Potassium: 4.3 mmol/L (ref 3.5–5.1)
Sodium: 139 mmol/L (ref 135–145)
TCO2: 26 mmol/L (ref 22–32)

## 2022-04-13 SURGERY — INSERTION OF ARTERIOVENOUS (AV) GORE-TEX GRAFT ARM
Anesthesia: Monitor Anesthesia Care | Site: Arm Upper | Laterality: Right

## 2022-04-13 MED ORDER — CHLORHEXIDINE GLUCONATE 4 % EX LIQD: 60.0000 mL | Freq: Once | CUTANEOUS | Status: DC

## 2022-04-13 MED ORDER — LIDOCAINE HCL (PF) 1 % IJ SOLN
INTRAMUSCULAR | Status: AC
  Filled 2022-04-13: qty 30

## 2022-04-13 MED ORDER — LIDOCAINE-EPINEPHRINE 1 %-1:200000 IJ SOLN
INTRAMUSCULAR | Status: AC
Start: 2022-04-13 — End: ?
  Filled 2022-04-13: qty 30

## 2022-04-13 MED ORDER — PROTAMINE SULFATE 10 MG/ML IV SOLN
INTRAVENOUS | Status: DC | PRN
  Administered 2022-04-13 (×4): 10 mg via INTRAVENOUS

## 2022-04-13 MED ORDER — LIDOCAINE HCL (PF) 1 % IJ SOLN
INTRAMUSCULAR | Status: DC | PRN
  Administered 2022-04-13: 8 mL

## 2022-04-13 MED ORDER — MIDAZOLAM HCL 2 MG/2ML IJ SOLN
INTRAMUSCULAR | Status: AC
  Filled 2022-04-13: qty 2

## 2022-04-13 MED ORDER — HEPARIN SODIUM (PORCINE) 1000 UNIT/ML IJ SOLN
INTRAMUSCULAR | Status: DC | PRN
  Administered 2022-04-13: 7000 [IU] via INTRAVENOUS

## 2022-04-13 MED ORDER — PROMETHAZINE HCL 25 MG/ML IJ SOLN: 6.2500 mg | INTRAMUSCULAR | Status: DC | PRN

## 2022-04-13 MED ORDER — HYDROCODONE-ACETAMINOPHEN 5-325 MG PO TABS: 1.0000 | ORAL_TABLET | ORAL | 0 refills | Status: DC | PRN

## 2022-04-13 MED ORDER — VANCOMYCIN HCL IN DEXTROSE 1-5 GM/200ML-% IV SOLN
INTRAVENOUS | Status: AC
  Administered 2022-04-13: 1000 mg via INTRAVENOUS
  Filled 2022-04-13: qty 200

## 2022-04-13 MED ORDER — ACETAMINOPHEN 500 MG PO TABS: 1000.0000 mg | ORAL_TABLET | Freq: Once | ORAL | Status: AC

## 2022-04-13 MED ORDER — LIDOCAINE-EPINEPHRINE (PF) 1 %-1:200000 IJ SOLN
INTRAMUSCULAR | Status: DC | PRN
  Administered 2022-04-13: 30 mL via INTRADERMAL

## 2022-04-13 MED ORDER — ONDANSETRON HCL 4 MG/2ML IJ SOLN
INTRAMUSCULAR | Status: AC
Start: 2022-04-13 — End: ?
  Filled 2022-04-13: qty 2

## 2022-04-13 MED ORDER — FENTANYL CITRATE (PF) 100 MCG/2ML IJ SOLN: 25.0000 ug | INTRAMUSCULAR | Status: DC | PRN

## 2022-04-13 MED ORDER — PHENYLEPHRINE 80 MCG/ML (10ML) SYRINGE FOR IV PUSH (FOR BLOOD PRESSURE SUPPORT)
PREFILLED_SYRINGE | INTRAVENOUS | Status: AC
  Filled 2022-04-13: qty 10

## 2022-04-13 MED ORDER — CHLORHEXIDINE GLUCONATE 0.12 % MT SOLN
OROMUCOSAL | Status: AC
  Administered 2022-04-13: 15 mL via OROMUCOSAL
  Filled 2022-04-13: qty 15

## 2022-04-13 MED ORDER — ACETAMINOPHEN 500 MG PO TABS
ORAL_TABLET | ORAL | Status: AC
  Administered 2022-04-13: 1000 mg via ORAL
  Filled 2022-04-13: qty 2

## 2022-04-13 MED ORDER — CHLORHEXIDINE GLUCONATE 0.12 % MT SOLN: 15.0000 mL | Freq: Once | OROMUCOSAL | Status: AC

## 2022-04-13 MED ORDER — SODIUM CHLORIDE 0.9 % IV SOLN: INTRAVENOUS | Status: DC | PRN

## 2022-04-13 MED ORDER — SODIUM CHLORIDE 0.9 % IV SOLN: INTRAVENOUS | Status: DC

## 2022-04-13 MED ORDER — ORAL CARE MOUTH RINSE: 15.0000 mL | Freq: Once | OROMUCOSAL | Status: AC

## 2022-04-13 MED ORDER — FENTANYL CITRATE (PF) 250 MCG/5ML IJ SOLN
INTRAMUSCULAR | Status: DC | PRN
  Administered 2022-04-13 (×2): 25 ug via INTRAVENOUS

## 2022-04-13 MED ORDER — FENTANYL CITRATE (PF) 250 MCG/5ML IJ SOLN
INTRAMUSCULAR | Status: AC
  Filled 2022-04-13: qty 5

## 2022-04-13 MED ORDER — 0.9 % SODIUM CHLORIDE (POUR BTL) OPTIME
TOPICAL | Status: DC | PRN
  Administered 2022-04-13: 1000 mL

## 2022-04-13 MED ORDER — ONDANSETRON HCL 4 MG/2ML IJ SOLN
INTRAMUSCULAR | Status: DC | PRN
  Administered 2022-04-13: 4 mg via INTRAVENOUS

## 2022-04-13 MED ORDER — PROPOFOL 1000 MG/100ML IV EMUL
INTRAVENOUS | Status: AC
  Filled 2022-04-13: qty 200

## 2022-04-13 MED ORDER — MIDAZOLAM HCL 2 MG/2ML IJ SOLN
INTRAMUSCULAR | Status: DC | PRN
  Administered 2022-04-13: 2 mg via INTRAVENOUS

## 2022-04-13 MED ORDER — HEPARIN SODIUM (PORCINE) 1000 UNIT/ML IJ SOLN
2000.0000 [IU] | Freq: Once | INTRAMUSCULAR | Status: AC
  Administered 2022-04-13: 2000 [IU] via INTRAVENOUS
  Filled 2022-04-13: qty 2

## 2022-04-13 MED ORDER — PROPOFOL 500 MG/50ML IV EMUL
INTRAVENOUS | Status: DC | PRN
  Administered 2022-04-13: 75 ug/kg/min via INTRAVENOUS

## 2022-04-13 MED ORDER — PROTAMINE SULFATE 10 MG/ML IV SOLN
INTRAVENOUS | Status: AC
  Filled 2022-04-13: qty 5

## 2022-04-13 MED ORDER — HEPARIN 6000 UNIT IRRIGATION SOLUTION
Status: DC | PRN
  Administered 2022-04-13: 1

## 2022-04-13 MED ORDER — VANCOMYCIN HCL IN DEXTROSE 1-5 GM/200ML-% IV SOLN: 1000.0000 mg | INTRAVENOUS | Status: AC

## 2022-04-13 MED ORDER — PHENYLEPHRINE HCL-NACL 20-0.9 MG/250ML-% IV SOLN
INTRAVENOUS | Status: AC
  Filled 2022-04-13: qty 750

## 2022-04-13 MED ORDER — PHENYLEPHRINE 80 MCG/ML (10ML) SYRINGE FOR IV PUSH (FOR BLOOD PRESSURE SUPPORT)
PREFILLED_SYRINGE | INTRAVENOUS | Status: DC | PRN
  Administered 2022-04-13: 240 ug via INTRAVENOUS
  Administered 2022-04-13 (×3): 160 ug via INTRAVENOUS
  Administered 2022-04-13 (×2): 80 ug via INTRAVENOUS

## 2022-04-13 MED ORDER — HEPARIN SODIUM (PORCINE) 1000 UNIT/ML IJ SOLN
INTRAMUSCULAR | Status: AC
Start: 2022-04-13 — End: ?
  Filled 2022-04-13: qty 10

## 2022-04-13 MED ORDER — PROPOFOL 10 MG/ML IV BOLUS
INTRAVENOUS | Status: AC
  Filled 2022-04-13: qty 20

## 2022-04-13 MED ORDER — PHENYLEPHRINE HCL-NACL 20-0.9 MG/250ML-% IV SOLN
INTRAVENOUS | Status: DC | PRN
  Administered 2022-04-13: 40 ug/min via INTRAVENOUS

## 2022-04-13 SURGICAL SUPPLY — 38 items
ADH SKN CLS APL DERMABOND .7 (GAUZE/BANDAGES/DRESSINGS) ×1
ARMBAND PINK RESTRICT EXTREMIT (MISCELLANEOUS) ×5 IMPLANT
BAG COUNTER SPONGE SURGICOUNT (BAG) ×3 IMPLANT
BAG SPNG CNTER NS LX DISP (BAG) ×1
CANISTER SUCT 3000ML PPV (MISCELLANEOUS) ×3 IMPLANT
CANNULA VESSEL 3MM 2 BLNT TIP (CANNULA) ×3 IMPLANT
CLIP TI MEDIUM 6 (CLIP) ×1 IMPLANT
CLIP TI WIDE RED SMALL 6 (CLIP) ×2 IMPLANT
DERMABOND ADVANCED (GAUZE/BANDAGES/DRESSINGS) ×1
DERMABOND ADVANCED .7 DNX12 (GAUZE/BANDAGES/DRESSINGS) ×2 IMPLANT
ELECT REM PT RETURN 9FT ADLT (ELECTROSURGICAL) ×2
ELECTRODE REM PT RTRN 9FT ADLT (ELECTROSURGICAL) ×2 IMPLANT
GLOVE BIO SURGEON STRL SZ7.5 (GLOVE) ×3 IMPLANT
GLOVE BIOGEL PI IND STRL 8 (GLOVE) ×2 IMPLANT
GLOVE BIOGEL PI INDICATOR 8 (GLOVE) ×1
GLOVE SURG POLY ORTHO LF SZ7.5 (GLOVE) IMPLANT
GLOVE SURG UNDER LTX SZ8 (GLOVE) ×3 IMPLANT
GOWN STRL REUS W/ TWL LRG LVL3 (GOWN DISPOSABLE) ×6 IMPLANT
GOWN STRL REUS W/TWL LRG LVL3 (GOWN DISPOSABLE) ×6
GRAFT GORETEX STRT 4-7X45 (Vascular Products) ×1 IMPLANT
KIT BASIN OR (CUSTOM PROCEDURE TRAY) ×3 IMPLANT
KIT TURNOVER KIT B (KITS) ×3 IMPLANT
NS IRRIG 1000ML POUR BTL (IV SOLUTION) ×3 IMPLANT
PACK CV ACCESS (CUSTOM PROCEDURE TRAY) ×3 IMPLANT
PAD ARMBOARD 7.5X6 YLW CONV (MISCELLANEOUS) ×6 IMPLANT
SLING ARM FOAM STRAP LRG (SOFTGOODS) IMPLANT
SLING ARM FOAM STRAP MED (SOFTGOODS) IMPLANT
SPIKE FLUID TRANSFER (MISCELLANEOUS) ×3 IMPLANT
SPONGE SURGIFOAM ABS GEL 100 (HEMOSTASIS) IMPLANT
SUT MNCRL AB 4-0 PS2 18 (SUTURE) ×6 IMPLANT
SUT PROLENE 6 0 BV (SUTURE) ×8 IMPLANT
SUT SILK 2 0 SH (SUTURE) ×1 IMPLANT
SUT VIC AB 3-0 SH 27 (SUTURE) ×4
SUT VIC AB 3-0 SH 27X BRD (SUTURE) ×4 IMPLANT
SYR TOOMEY 50ML (SYRINGE) IMPLANT
TOWEL GREEN STERILE (TOWEL DISPOSABLE) ×3 IMPLANT
UNDERPAD 30X36 HEAVY ABSORB (UNDERPADS AND DIAPERS) ×3 IMPLANT
WATER STERILE IRR 1000ML POUR (IV SOLUTION) ×3 IMPLANT

## 2022-04-13 NOTE — Op Note (Signed)
    NAME: [name]    MRN: [id number] DOB: 1955/12/15    DATE OF OPERATION: 04/13/2022  PREOP DIAGNOSIS:    End-stage renal disease  POSTOP DIAGNOSIS:    Same  PROCEDURE:    New right upper arm AV graft (4-7 mm PTFE graft)  SURGEON: Judeth Cornfield. Scot Dock, MD  ASSIST: Luisa Dago, PA  ANESTHESIA: Local with sedation  EBL: Minimal  INDICATIONS:    Victoria [name] is a 66 y.o. female who presents for new access.  FINDINGS:   Good thrill at the completion of the procedure with a brisk radial and ulnar signal with the Doppler  TECHNIQUE:   Patient was taken to the operating room and sedated by anesthesia.  The right upper extremity was prepped and draped in usual sterile fashion.  After the skin was anesthetized with 1% lidocaine a longitudinal incision was made over the brachial artery the brachial artery was dissected free and controlled with a vessel loop.  A separate longitudinal incision was made beneath the axilla after the skin was anesthetized.  Here the high brachial vein was dissected free.  The skin between the 2 areas of was anesthetized and a 4-7 mm PTFE graft was tunneled between the 2 incisions.  The patient was heparinized.  The brachial artery was clamped proximally and distally and a longitudinal arteriotomy was made.  The short segment of the 4 mm end of the graft was excised, the graft slightly spatulated and sewn end-to-side to the brachial artery using continuous 6-0 Prolene suture.  The graft was then pulled the appropriate length for anastomosis to the high brachial vein.  The vein was ligated distally and spatulated proximally.  The graft was cut to the appropriate length, spatulated, and sewn end to end to the vein using 2 continuous 6-0 Prolene sutures.  At the completion there was an excellent thrill in the graft.  There was a radial and ulnar signal with the Doppler.  The heparin was partially reversed with protamine.  The wounds were each closed with a  deep layer of 3-0 Vicryl and the skin closed with 4-0 Monocryl.  Dermabond was applied.  The patient tolerated the procedure well and was transferred to recovery in stable condition.  All needle and sponge counts were correct.  Given the complexity of the case,  the assistant was necessary in order to expedient the procedure and safely perform the technical aspects of the operation.  The assistant provided traction and countertraction to assist with exposure of the artery and vein.  They also assisted with suture ligation of multiple venous branches. They also assisted with tunneling of the graft.  They played a critical role for both anastomoses.. These skills, especially following the Prolene suture for the anastomosis, could not have been adequately performed by a scrub tech assistant.    Deitra Mayo, MD, FACS [facility]  DATE OF DICTATION:   04/13/2022

## 2022-04-13 NOTE — Discharge Instructions (Addendum)
[facility]  Discharge Instructions  AV Fistula or Graft Surgery for Dialysis Access  Please refer to the following instructions for your post-procedure care. Your surgeon or physician assistant will discuss any changes with you.  Activity  You may drive the day following your surgery, if you are comfortable and no longer taking prescription pain medication. Resume full activity as the soreness in your incision resolves.  Bathing/Showering  You may shower after you go home. Keep your incision dry for 48 hours. Do not soak in a bathtub, hot tub, or swim until the incision heals completely. You may not shower if you have a hemodialysis catheter.  Incision Care  Clean your incision with mild soap and water after 48 hours. Pat the area dry with a clean towel. You do not need a bandage unless otherwise instructed. Do not apply any ointments or creams to your incision. You may have skin glue on your incision. Do not peel it off. It will come off on its own in about one week. Your arm may swell a bit after surgery. To reduce swelling use pillows to elevate your arm so it is above your heart. Your doctor will tell you if you need to lightly wrap your arm with an ACE bandage.  Diet  Resume your normal diet. There are not special food restrictions following this procedure. In order to heal from your surgery, it is CRITICAL to get adequate nutrition. Your body requires vitamins, minerals, and protein. Vegetables are the best source of vitamins and minerals. Vegetables also provide the perfect balance of protein. Processed food has little nutritional value, so try to avoid this.  Medications  Resume taking all of your medications. If your incision is causing pain, you may take over-the counter pain relievers such as acetaminophen (Tylenol). If you were prescribed a stronger pain medication, please be aware these medications can cause nausea and constipation. Prevent  nausea by taking the medication with a snack or meal. Avoid constipation by drinking plenty of fluids and eating foods with high amount of fiber, such as fruits, vegetables, and grains.  Do not take Tylenol if you are taking prescription pain medications.  Follow up Your surgeon may want to see you in the office following your access surgery. If so, this will be arranged at the time of your surgery.  Please call us immediately for any of the following conditions:  Increased pain, redness, drainage (pus) from your incision site Fever of 101 degrees or higher Severe or worsening pain at your incision site Hand pain or numbness.  Reduce your risk of vascular disease:  Stop smoking. If you would like help, call [facility] at 1-800-QUIT-NOW (206) 120-4094) or [place] at [place] your cholesterol Maintain a desired weight Control your diabetes Keep your blood pressure down  Dialysis  It will take several weeks to several months for your new dialysis access to be ready for use. Your surgeon will determine when it is okay to use it. Your nephrologist will continue to direct your dialysis. You can continue to use your Permcath until your new access is ready for use.   04/13/2022 Victoria [name] [id number] April 28, 1956  Surgeon(s): Angelia Mould, MD  Procedure(s): INSERTION OF RIGHT ARTERIOVENOUS (AV) 4-66mm x 45cm GORE-TEX GRAFT   May stick graft immediately   May stick graft on designated area only:   x Do not stick graft for 4 weeks    If you have any questions, please call  the office at [phone].

## 2022-04-13 NOTE — Anesthesia Procedure Notes (Signed)
Procedure Name: MAC Date/Time: 04/13/2022 7:35 AM  Performed by: Harden Mo, CRNAPre-anesthesia Checklist: Patient identified, Emergency Drugs available, Suction available and Patient being monitored Patient Re-evaluated:Patient Re-evaluated prior to induction Oxygen Delivery Method: Simple face mask Preoxygenation: Pre-oxygenation with 100% oxygen Induction Type: IV induction Placement Confirmation: positive ETCO2 and breath sounds checked- equal and bilateral Dental Injury: Teeth and Oropharynx as per pre-operative assessment

## 2022-04-13 NOTE — Anesthesia Postprocedure Evaluation (Signed)
Anesthesia Post Note  Patient: Victoria [name]  Procedure(s) Performed: INSERTION OF RIGHT ARTERIOVENOUS (AV) 4-89m x 45cm GORE-TEX GRAFT (Right: Arm Upper)     Patient location during evaluation: PACU Anesthesia Type: MAC Level of consciousness: awake and alert Pain management: pain level controlled Vital Signs Assessment: post-procedure vital signs reviewed and stable Respiratory status: spontaneous breathing, nonlabored ventilation, respiratory function stable and patient connected to nasal cannula oxygen Cardiovascular status: stable and blood pressure returned to baseline Postop Assessment: no apparent nausea or vomiting Anesthetic complications: no   No notable events documented.  Last Vitals:  Vitals:   04/13/22 0925 04/13/22 0935  BP: (!) 80/46 (!) 93/47  Pulse: (!) 105 (!) 110  Resp: 15 17  Temp:  36.8 C  SpO2: 94% 95%    Last Pain:  Vitals:   04/13/22 0935  TempSrc:   PainSc: 0-No pain                 FTiajuana Amass

## 2022-04-13 NOTE — Transfer of Care (Signed)
Immediate Anesthesia Transfer of Care Note  Patient: Victoria [name]  Procedure(s) Performed: INSERTION OF RIGHT ARTERIOVENOUS (AV) 4-48m x 45cm GORE-TEX GRAFT (Right: Arm Upper)  Patient Location: PACU  Anesthesia Type:MAC  Level of Consciousness: awake, alert  and oriented  Airway & Oxygen Therapy: Patient Spontanous Breathing  Post-op Assessment: Report given to RN, Post -op Vital signs reviewed and stable and Patient moving all extremities X 4  Post vital signs: Reviewed and stable  Last Vitals:  Vitals Value Taken Time  BP 83/45   Temp    Pulse 98   Resp 14   SpO2 99     Last Pain:  Vitals:   04/13/22 0629  TempSrc:   PainSc: 0-No pain         Complications: No notable events documented.

## 2022-04-13 NOTE — Interval H&P Note (Signed)
History and Physical Interval Note:  04/13/2022 7:19 AM  Victoria [name]  has presented today for surgery, with the diagnosis of ESRD.  The various methods of treatment have been discussed with the patient and family. After consideration of risks, benefits and other options for treatment, the patient has consented to  Procedure(s): INSERTION OF RIGHT ARTERIOVENOUS (AV) GORE-TEX GRAFT (Right) as a surgical intervention.  The patient's history has been reviewed, patient examined, no change in status, stable for surgery.  I have reviewed the patient's chart and labs.  Questions were answered to the patient's satisfaction.     Deitra Mayo

## 2022-04-13 NOTE — Anesthesia Preprocedure Evaluation (Addendum)
Anesthesia Evaluation  Patient identified by MRN, date of birth, ID band Patient awake    Reviewed: Allergy & Precautions, NPO status , Patient's Chart, lab work & pertinent test results  Airway Mallampati: III  TM Distance: >3 FB Neck ROM: Full    Dental  (+) Dental Advisory Given   Pulmonary neg pulmonary ROS,    breath sounds clear to auscultation       Cardiovascular hypertension, Pt. on medications  Rhythm:Regular Rate:Normal     Neuro/Psych negative neurological ROS     GI/Hepatic Neg liver ROS, GERD  ,  Endo/Other  negative endocrine ROS  Renal/GU ESRF and DialysisRenal disease     Musculoskeletal   Abdominal   Peds  Hematology  (+) Blood dyscrasia, anemia ,   Anesthesia Other Findings   Reproductive/Obstetrics                            Anesthesia Physical Anesthesia Plan  ASA: 3  Anesthesia Plan: MAC   Post-op Pain Management: Tylenol PO (pre-op)*   Induction:   PONV Risk Score and Plan: 2 and Propofol infusion, Ondansetron and Treatment may vary due to age or medical condition  Airway Management Planned: Natural Airway and Simple Face Mask  Additional Equipment: None  Intra-op Plan:   Post-operative Plan:   Informed Consent: I have reviewed the patients History and Physical, chart, labs and discussed the procedure including the risks, benefits and alternatives for the proposed anesthesia with the patient or authorized representative who has indicated his/her understanding and acceptance.       Plan Discussed with: CRNA  Anesthesia Plan Comments:        Anesthesia Quick Evaluation

## 2022-04-14 ENCOUNTER — Encounter (HOSPITAL_COMMUNITY): Payer: Self-pay | Admitting: Vascular Surgery

## 2022-04-14 DIAGNOSIS — Z992 Dependence on renal dialysis: Secondary | ICD-10-CM | POA: Diagnosis not present

## 2022-04-14 DIAGNOSIS — D688 Other specified coagulation defects: Secondary | ICD-10-CM | POA: Diagnosis not present

## 2022-04-14 DIAGNOSIS — D689 Coagulation defect, unspecified: Secondary | ICD-10-CM | POA: Diagnosis not present

## 2022-04-14 DIAGNOSIS — L299 Pruritus, unspecified: Secondary | ICD-10-CM | POA: Diagnosis not present

## 2022-04-14 DIAGNOSIS — T8249XA Other complication of vascular dialysis catheter, initial encounter: Secondary | ICD-10-CM | POA: Diagnosis not present

## 2022-04-14 DIAGNOSIS — N2581 Secondary hyperparathyroidism of renal origin: Secondary | ICD-10-CM | POA: Diagnosis not present

## 2022-04-14 DIAGNOSIS — N186 End stage renal disease: Secondary | ICD-10-CM | POA: Diagnosis not present

## 2022-04-16 DIAGNOSIS — D688 Other specified coagulation defects: Secondary | ICD-10-CM | POA: Diagnosis not present

## 2022-04-16 DIAGNOSIS — T8249XA Other complication of vascular dialysis catheter, initial encounter: Secondary | ICD-10-CM | POA: Diagnosis not present

## 2022-04-16 DIAGNOSIS — L299 Pruritus, unspecified: Secondary | ICD-10-CM | POA: Diagnosis not present

## 2022-04-16 DIAGNOSIS — Z992 Dependence on renal dialysis: Secondary | ICD-10-CM | POA: Diagnosis not present

## 2022-04-16 DIAGNOSIS — N2581 Secondary hyperparathyroidism of renal origin: Secondary | ICD-10-CM | POA: Diagnosis not present

## 2022-04-16 DIAGNOSIS — N186 End stage renal disease: Secondary | ICD-10-CM | POA: Diagnosis not present

## 2022-04-16 DIAGNOSIS — D689 Coagulation defect, unspecified: Secondary | ICD-10-CM | POA: Diagnosis not present

## 2022-04-19 DIAGNOSIS — I120 Hypertensive chronic kidney disease with stage 5 chronic kidney disease or end stage renal disease: Secondary | ICD-10-CM | POA: Diagnosis not present

## 2022-04-19 DIAGNOSIS — D689 Coagulation defect, unspecified: Secondary | ICD-10-CM | POA: Diagnosis not present

## 2022-04-19 DIAGNOSIS — N186 End stage renal disease: Secondary | ICD-10-CM | POA: Diagnosis not present

## 2022-04-19 DIAGNOSIS — L299 Pruritus, unspecified: Secondary | ICD-10-CM | POA: Diagnosis not present

## 2022-04-19 DIAGNOSIS — T8249XA Other complication of vascular dialysis catheter, initial encounter: Secondary | ICD-10-CM | POA: Diagnosis not present

## 2022-04-19 DIAGNOSIS — Z992 Dependence on renal dialysis: Secondary | ICD-10-CM | POA: Diagnosis not present

## 2022-04-19 DIAGNOSIS — N2581 Secondary hyperparathyroidism of renal origin: Secondary | ICD-10-CM | POA: Diagnosis not present

## 2022-04-19 DIAGNOSIS — D688 Other specified coagulation defects: Secondary | ICD-10-CM | POA: Diagnosis not present

## 2022-04-21 DIAGNOSIS — T8249XA Other complication of vascular dialysis catheter, initial encounter: Secondary | ICD-10-CM | POA: Diagnosis not present

## 2022-04-21 DIAGNOSIS — N2581 Secondary hyperparathyroidism of renal origin: Secondary | ICD-10-CM | POA: Diagnosis not present

## 2022-04-21 DIAGNOSIS — Z992 Dependence on renal dialysis: Secondary | ICD-10-CM | POA: Diagnosis not present

## 2022-04-21 DIAGNOSIS — D688 Other specified coagulation defects: Secondary | ICD-10-CM | POA: Diagnosis not present

## 2022-04-21 DIAGNOSIS — D689 Coagulation defect, unspecified: Secondary | ICD-10-CM | POA: Diagnosis not present

## 2022-04-21 DIAGNOSIS — N186 End stage renal disease: Secondary | ICD-10-CM | POA: Diagnosis not present

## 2022-04-23 DIAGNOSIS — N2581 Secondary hyperparathyroidism of renal origin: Secondary | ICD-10-CM | POA: Diagnosis not present

## 2022-04-23 DIAGNOSIS — Z992 Dependence on renal dialysis: Secondary | ICD-10-CM | POA: Diagnosis not present

## 2022-04-23 DIAGNOSIS — D689 Coagulation defect, unspecified: Secondary | ICD-10-CM | POA: Diagnosis not present

## 2022-04-23 DIAGNOSIS — N186 End stage renal disease: Secondary | ICD-10-CM | POA: Diagnosis not present

## 2022-04-23 DIAGNOSIS — T8249XA Other complication of vascular dialysis catheter, initial encounter: Secondary | ICD-10-CM | POA: Diagnosis not present

## 2022-04-23 DIAGNOSIS — D688 Other specified coagulation defects: Secondary | ICD-10-CM | POA: Diagnosis not present

## 2022-04-26 DIAGNOSIS — Z992 Dependence on renal dialysis: Secondary | ICD-10-CM | POA: Diagnosis not present

## 2022-04-26 DIAGNOSIS — D688 Other specified coagulation defects: Secondary | ICD-10-CM | POA: Diagnosis not present

## 2022-04-26 DIAGNOSIS — D689 Coagulation defect, unspecified: Secondary | ICD-10-CM | POA: Diagnosis not present

## 2022-04-26 DIAGNOSIS — T8249XA Other complication of vascular dialysis catheter, initial encounter: Secondary | ICD-10-CM | POA: Diagnosis not present

## 2022-04-26 DIAGNOSIS — N186 End stage renal disease: Secondary | ICD-10-CM | POA: Diagnosis not present

## 2022-04-26 DIAGNOSIS — N2581 Secondary hyperparathyroidism of renal origin: Secondary | ICD-10-CM | POA: Diagnosis not present

## 2022-04-27 ENCOUNTER — Other Ambulatory Visit: Payer: Self-pay | Admitting: Nurse Practitioner

## 2022-04-27 DIAGNOSIS — Z1231 Encounter for screening mammogram for malignant neoplasm of breast: Secondary | ICD-10-CM

## 2022-04-28 DIAGNOSIS — T8249XA Other complication of vascular dialysis catheter, initial encounter: Secondary | ICD-10-CM | POA: Diagnosis not present

## 2022-04-28 DIAGNOSIS — Z992 Dependence on renal dialysis: Secondary | ICD-10-CM | POA: Diagnosis not present

## 2022-04-28 DIAGNOSIS — N2581 Secondary hyperparathyroidism of renal origin: Secondary | ICD-10-CM | POA: Diagnosis not present

## 2022-04-28 DIAGNOSIS — N186 End stage renal disease: Secondary | ICD-10-CM | POA: Diagnosis not present

## 2022-04-28 DIAGNOSIS — D689 Coagulation defect, unspecified: Secondary | ICD-10-CM | POA: Diagnosis not present

## 2022-04-28 DIAGNOSIS — D688 Other specified coagulation defects: Secondary | ICD-10-CM | POA: Diagnosis not present

## 2022-04-30 DIAGNOSIS — N186 End stage renal disease: Secondary | ICD-10-CM | POA: Diagnosis not present

## 2022-04-30 DIAGNOSIS — Z992 Dependence on renal dialysis: Secondary | ICD-10-CM | POA: Diagnosis not present

## 2022-04-30 DIAGNOSIS — N2581 Secondary hyperparathyroidism of renal origin: Secondary | ICD-10-CM | POA: Diagnosis not present

## 2022-04-30 DIAGNOSIS — D689 Coagulation defect, unspecified: Secondary | ICD-10-CM | POA: Diagnosis not present

## 2022-04-30 DIAGNOSIS — D688 Other specified coagulation defects: Secondary | ICD-10-CM | POA: Diagnosis not present

## 2022-04-30 DIAGNOSIS — T8249XA Other complication of vascular dialysis catheter, initial encounter: Secondary | ICD-10-CM | POA: Diagnosis not present

## 2022-05-03 DIAGNOSIS — T8249XA Other complication of vascular dialysis catheter, initial encounter: Secondary | ICD-10-CM | POA: Diagnosis not present

## 2022-05-03 DIAGNOSIS — Z992 Dependence on renal dialysis: Secondary | ICD-10-CM | POA: Diagnosis not present

## 2022-05-03 DIAGNOSIS — N186 End stage renal disease: Secondary | ICD-10-CM | POA: Diagnosis not present

## 2022-05-03 DIAGNOSIS — D688 Other specified coagulation defects: Secondary | ICD-10-CM | POA: Diagnosis not present

## 2022-05-03 DIAGNOSIS — N2581 Secondary hyperparathyroidism of renal origin: Secondary | ICD-10-CM | POA: Diagnosis not present

## 2022-05-03 DIAGNOSIS — D689 Coagulation defect, unspecified: Secondary | ICD-10-CM | POA: Diagnosis not present

## 2022-05-05 DIAGNOSIS — T8249XA Other complication of vascular dialysis catheter, initial encounter: Secondary | ICD-10-CM | POA: Diagnosis not present

## 2022-05-05 DIAGNOSIS — N186 End stage renal disease: Secondary | ICD-10-CM | POA: Diagnosis not present

## 2022-05-05 DIAGNOSIS — D688 Other specified coagulation defects: Secondary | ICD-10-CM | POA: Diagnosis not present

## 2022-05-05 DIAGNOSIS — N2581 Secondary hyperparathyroidism of renal origin: Secondary | ICD-10-CM | POA: Diagnosis not present

## 2022-05-05 DIAGNOSIS — D689 Coagulation defect, unspecified: Secondary | ICD-10-CM | POA: Diagnosis not present

## 2022-05-05 DIAGNOSIS — Z992 Dependence on renal dialysis: Secondary | ICD-10-CM | POA: Diagnosis not present

## 2022-05-07 DIAGNOSIS — N2581 Secondary hyperparathyroidism of renal origin: Secondary | ICD-10-CM | POA: Diagnosis not present

## 2022-05-07 DIAGNOSIS — D689 Coagulation defect, unspecified: Secondary | ICD-10-CM | POA: Diagnosis not present

## 2022-05-07 DIAGNOSIS — D688 Other specified coagulation defects: Secondary | ICD-10-CM | POA: Diagnosis not present

## 2022-05-07 DIAGNOSIS — N186 End stage renal disease: Secondary | ICD-10-CM | POA: Diagnosis not present

## 2022-05-07 DIAGNOSIS — T8249XA Other complication of vascular dialysis catheter, initial encounter: Secondary | ICD-10-CM | POA: Diagnosis not present

## 2022-05-07 DIAGNOSIS — Z992 Dependence on renal dialysis: Secondary | ICD-10-CM | POA: Diagnosis not present

## 2022-05-10 DIAGNOSIS — N2581 Secondary hyperparathyroidism of renal origin: Secondary | ICD-10-CM | POA: Diagnosis not present

## 2022-05-10 DIAGNOSIS — T8249XA Other complication of vascular dialysis catheter, initial encounter: Secondary | ICD-10-CM | POA: Diagnosis not present

## 2022-05-10 DIAGNOSIS — D689 Coagulation defect, unspecified: Secondary | ICD-10-CM | POA: Diagnosis not present

## 2022-05-10 DIAGNOSIS — N186 End stage renal disease: Secondary | ICD-10-CM | POA: Diagnosis not present

## 2022-05-10 DIAGNOSIS — D688 Other specified coagulation defects: Secondary | ICD-10-CM | POA: Diagnosis not present

## 2022-05-10 DIAGNOSIS — Z992 Dependence on renal dialysis: Secondary | ICD-10-CM | POA: Diagnosis not present

## 2022-05-12 DIAGNOSIS — T8249XA Other complication of vascular dialysis catheter, initial encounter: Secondary | ICD-10-CM | POA: Diagnosis not present

## 2022-05-12 DIAGNOSIS — Z992 Dependence on renal dialysis: Secondary | ICD-10-CM | POA: Diagnosis not present

## 2022-05-12 DIAGNOSIS — N186 End stage renal disease: Secondary | ICD-10-CM | POA: Diagnosis not present

## 2022-05-12 DIAGNOSIS — N2581 Secondary hyperparathyroidism of renal origin: Secondary | ICD-10-CM | POA: Diagnosis not present

## 2022-05-12 DIAGNOSIS — D688 Other specified coagulation defects: Secondary | ICD-10-CM | POA: Diagnosis not present

## 2022-05-12 DIAGNOSIS — D689 Coagulation defect, unspecified: Secondary | ICD-10-CM | POA: Diagnosis not present

## 2022-05-13 ENCOUNTER — Ambulatory Visit (INDEPENDENT_AMBULATORY_CARE_PROVIDER_SITE_OTHER): Payer: Medicare Other | Admitting: Physician Assistant

## 2022-05-13 VITALS — BP 95/58 | HR 74 | Temp 98.6°F | Resp 20 | Ht 63.5 in | Wt 169.9 lb

## 2022-05-13 DIAGNOSIS — N186 End stage renal disease: Secondary | ICD-10-CM

## 2022-05-13 DIAGNOSIS — Z992 Dependence on renal dialysis: Secondary | ICD-10-CM

## 2022-05-13 NOTE — Progress Notes (Signed)
    Postoperative Access Visit   History of Present Illness   Victoria [name] is a 66 y.o. year old female who presents for postoperative follow-up for: right upper arm arteriovenous graft (Date: 04/13/22).  The patient's wounds are  healed.  The patient notes denies steal symptoms.  The patient is able to complete their activities of daily living.  She is currently dialyzing via left IJ [facility] on a Monday Wednesday Friday schedule at the [facility] location.  Physical Examination   Vitals:   05/13/22 0932  BP: (!) 95/58  Pulse: 74  Resp: 20  Temp: 98.6 F (37 C)  TempSrc: Temporal  SpO2: 99%  Weight: 169 lb 14.4 oz (77.1 kg)  Height: 5' 3.5" (1.613 m)   Body mass index is 29.62 kg/m.  right arm Incisions are healed, palpable radial pulse, hand grip is 5/5, sensation in digits is intact, palpable thrill, bruit can be auscultated     Medical Decision Making   Victoria [name] is a 66 y.o. year old female who presents s/p right upper arm arteriovenous graft  Patent R arm AVG without signs or symptoms of steal syndrome The patient's access will be ready for use Monday 05/17/22 The patient's tunneled dialysis catheter can be removed when Nephrology is comfortable with the performance of the AVG The patient may follow up on a prn basis   Dagoberto Ligas PA-C [facility] [place] Office: [phone]  Clinic MD: Scot Dock

## 2022-05-14 DIAGNOSIS — D689 Coagulation defect, unspecified: Secondary | ICD-10-CM | POA: Diagnosis not present

## 2022-05-14 DIAGNOSIS — N186 End stage renal disease: Secondary | ICD-10-CM | POA: Diagnosis not present

## 2022-05-14 DIAGNOSIS — D688 Other specified coagulation defects: Secondary | ICD-10-CM | POA: Diagnosis not present

## 2022-05-14 DIAGNOSIS — T8249XA Other complication of vascular dialysis catheter, initial encounter: Secondary | ICD-10-CM | POA: Diagnosis not present

## 2022-05-14 DIAGNOSIS — N2581 Secondary hyperparathyroidism of renal origin: Secondary | ICD-10-CM | POA: Diagnosis not present

## 2022-05-14 DIAGNOSIS — Z992 Dependence on renal dialysis: Secondary | ICD-10-CM | POA: Diagnosis not present

## 2022-05-17 ENCOUNTER — Telehealth: Payer: Self-pay

## 2022-05-17 DIAGNOSIS — D688 Other specified coagulation defects: Secondary | ICD-10-CM | POA: Diagnosis not present

## 2022-05-17 DIAGNOSIS — T8249XA Other complication of vascular dialysis catheter, initial encounter: Secondary | ICD-10-CM | POA: Diagnosis not present

## 2022-05-17 DIAGNOSIS — D689 Coagulation defect, unspecified: Secondary | ICD-10-CM | POA: Diagnosis not present

## 2022-05-17 DIAGNOSIS — N186 End stage renal disease: Secondary | ICD-10-CM | POA: Diagnosis not present

## 2022-05-17 DIAGNOSIS — N2581 Secondary hyperparathyroidism of renal origin: Secondary | ICD-10-CM | POA: Diagnosis not present

## 2022-05-17 DIAGNOSIS — Z992 Dependence on renal dialysis: Secondary | ICD-10-CM | POA: Diagnosis not present

## 2022-05-17 NOTE — Telephone Encounter (Signed)
I attempted to leave message for patient to call back and schedule Medicare Annual Wellness Visit (AWV) in office. Patient doesn't have voice mail.   If not able to come in office, please offer to do virtually or by telephone.  Left office number and my jabber [phone].  Last AWV:06/24/2020   Please schedule at anytime with Nurse Health Advisor.

## 2022-05-19 DIAGNOSIS — T8249XA Other complication of vascular dialysis catheter, initial encounter: Secondary | ICD-10-CM | POA: Diagnosis not present

## 2022-05-19 DIAGNOSIS — D689 Coagulation defect, unspecified: Secondary | ICD-10-CM | POA: Diagnosis not present

## 2022-05-19 DIAGNOSIS — Z992 Dependence on renal dialysis: Secondary | ICD-10-CM | POA: Diagnosis not present

## 2022-05-19 DIAGNOSIS — N2581 Secondary hyperparathyroidism of renal origin: Secondary | ICD-10-CM | POA: Diagnosis not present

## 2022-05-19 DIAGNOSIS — N186 End stage renal disease: Secondary | ICD-10-CM | POA: Diagnosis not present

## 2022-05-19 DIAGNOSIS — D688 Other specified coagulation defects: Secondary | ICD-10-CM | POA: Diagnosis not present

## 2022-05-20 DIAGNOSIS — N186 End stage renal disease: Secondary | ICD-10-CM | POA: Diagnosis not present

## 2022-05-20 DIAGNOSIS — I120 Hypertensive chronic kidney disease with stage 5 chronic kidney disease or end stage renal disease: Secondary | ICD-10-CM | POA: Diagnosis not present

## 2022-05-20 DIAGNOSIS — Z992 Dependence on renal dialysis: Secondary | ICD-10-CM | POA: Diagnosis not present

## 2022-05-21 DIAGNOSIS — D688 Other specified coagulation defects: Secondary | ICD-10-CM | POA: Diagnosis not present

## 2022-05-21 DIAGNOSIS — N186 End stage renal disease: Secondary | ICD-10-CM | POA: Diagnosis not present

## 2022-05-21 DIAGNOSIS — Z992 Dependence on renal dialysis: Secondary | ICD-10-CM | POA: Diagnosis not present

## 2022-05-21 DIAGNOSIS — T8249XA Other complication of vascular dialysis catheter, initial encounter: Secondary | ICD-10-CM | POA: Diagnosis not present

## 2022-05-21 DIAGNOSIS — D689 Coagulation defect, unspecified: Secondary | ICD-10-CM | POA: Diagnosis not present

## 2022-05-21 DIAGNOSIS — N2581 Secondary hyperparathyroidism of renal origin: Secondary | ICD-10-CM | POA: Diagnosis not present

## 2022-05-24 DIAGNOSIS — Z992 Dependence on renal dialysis: Secondary | ICD-10-CM | POA: Diagnosis not present

## 2022-05-24 DIAGNOSIS — N186 End stage renal disease: Secondary | ICD-10-CM | POA: Diagnosis not present

## 2022-05-24 DIAGNOSIS — D689 Coagulation defect, unspecified: Secondary | ICD-10-CM | POA: Diagnosis not present

## 2022-05-24 DIAGNOSIS — T8249XA Other complication of vascular dialysis catheter, initial encounter: Secondary | ICD-10-CM | POA: Diagnosis not present

## 2022-05-24 DIAGNOSIS — N2581 Secondary hyperparathyroidism of renal origin: Secondary | ICD-10-CM | POA: Diagnosis not present

## 2022-05-24 DIAGNOSIS — D688 Other specified coagulation defects: Secondary | ICD-10-CM | POA: Diagnosis not present

## 2022-05-26 ENCOUNTER — Other Ambulatory Visit: Payer: Self-pay | Admitting: Nephrology

## 2022-05-26 DIAGNOSIS — N186 End stage renal disease: Secondary | ICD-10-CM

## 2022-05-26 DIAGNOSIS — N2581 Secondary hyperparathyroidism of renal origin: Secondary | ICD-10-CM | POA: Diagnosis not present

## 2022-05-26 DIAGNOSIS — Z992 Dependence on renal dialysis: Secondary | ICD-10-CM | POA: Diagnosis not present

## 2022-05-26 DIAGNOSIS — T8249XA Other complication of vascular dialysis catheter, initial encounter: Secondary | ICD-10-CM | POA: Diagnosis not present

## 2022-05-26 DIAGNOSIS — D689 Coagulation defect, unspecified: Secondary | ICD-10-CM | POA: Diagnosis not present

## 2022-05-26 DIAGNOSIS — D688 Other specified coagulation defects: Secondary | ICD-10-CM | POA: Diagnosis not present

## 2022-05-28 DIAGNOSIS — D688 Other specified coagulation defects: Secondary | ICD-10-CM | POA: Diagnosis not present

## 2022-05-28 DIAGNOSIS — D689 Coagulation defect, unspecified: Secondary | ICD-10-CM | POA: Diagnosis not present

## 2022-05-28 DIAGNOSIS — T8249XA Other complication of vascular dialysis catheter, initial encounter: Secondary | ICD-10-CM | POA: Diagnosis not present

## 2022-05-28 DIAGNOSIS — Z992 Dependence on renal dialysis: Secondary | ICD-10-CM | POA: Diagnosis not present

## 2022-05-28 DIAGNOSIS — N2581 Secondary hyperparathyroidism of renal origin: Secondary | ICD-10-CM | POA: Diagnosis not present

## 2022-05-28 DIAGNOSIS — N186 End stage renal disease: Secondary | ICD-10-CM | POA: Diagnosis not present

## 2022-05-31 DIAGNOSIS — N186 End stage renal disease: Secondary | ICD-10-CM | POA: Diagnosis not present

## 2022-05-31 DIAGNOSIS — D689 Coagulation defect, unspecified: Secondary | ICD-10-CM | POA: Diagnosis not present

## 2022-05-31 DIAGNOSIS — T8249XA Other complication of vascular dialysis catheter, initial encounter: Secondary | ICD-10-CM | POA: Diagnosis not present

## 2022-05-31 DIAGNOSIS — D688 Other specified coagulation defects: Secondary | ICD-10-CM | POA: Diagnosis not present

## 2022-05-31 DIAGNOSIS — Z992 Dependence on renal dialysis: Secondary | ICD-10-CM | POA: Diagnosis not present

## 2022-05-31 DIAGNOSIS — N2581 Secondary hyperparathyroidism of renal origin: Secondary | ICD-10-CM | POA: Diagnosis not present

## 2022-06-01 ENCOUNTER — Ambulatory Visit
Admission: RE | Admit: 2022-06-01 | Discharge: 2022-06-01 | Disposition: A | Discharge status: Home / Self Care | Payer: Medicare Other | Source: Ambulatory Visit | Attending: Nephrology | Admitting: Nephrology

## 2022-06-01 DIAGNOSIS — N281 Cyst of kidney, acquired: Secondary | ICD-10-CM | POA: Diagnosis not present

## 2022-06-01 DIAGNOSIS — N186 End stage renal disease: Secondary | ICD-10-CM | POA: Diagnosis not present

## 2022-06-01 DIAGNOSIS — N261 Atrophy of kidney (terminal): Secondary | ICD-10-CM | POA: Diagnosis not present

## 2022-06-01 DIAGNOSIS — Z992 Dependence on renal dialysis: Secondary | ICD-10-CM | POA: Diagnosis not present

## 2022-06-02 DIAGNOSIS — T8249XA Other complication of vascular dialysis catheter, initial encounter: Secondary | ICD-10-CM | POA: Diagnosis not present

## 2022-06-02 DIAGNOSIS — Z992 Dependence on renal dialysis: Secondary | ICD-10-CM | POA: Diagnosis not present

## 2022-06-02 DIAGNOSIS — D688 Other specified coagulation defects: Secondary | ICD-10-CM | POA: Diagnosis not present

## 2022-06-02 DIAGNOSIS — D689 Coagulation defect, unspecified: Secondary | ICD-10-CM | POA: Diagnosis not present

## 2022-06-02 DIAGNOSIS — N186 End stage renal disease: Secondary | ICD-10-CM | POA: Diagnosis not present

## 2022-06-02 DIAGNOSIS — N2581 Secondary hyperparathyroidism of renal origin: Secondary | ICD-10-CM | POA: Diagnosis not present

## 2022-06-03 DIAGNOSIS — N186 End stage renal disease: Secondary | ICD-10-CM | POA: Diagnosis not present

## 2022-06-03 DIAGNOSIS — Z992 Dependence on renal dialysis: Secondary | ICD-10-CM | POA: Diagnosis not present

## 2022-06-03 DIAGNOSIS — Z452 Encounter for adjustment and management of vascular access device: Secondary | ICD-10-CM | POA: Diagnosis not present

## 2022-06-04 DIAGNOSIS — N186 End stage renal disease: Secondary | ICD-10-CM | POA: Diagnosis not present

## 2022-06-04 DIAGNOSIS — D688 Other specified coagulation defects: Secondary | ICD-10-CM | POA: Diagnosis not present

## 2022-06-04 DIAGNOSIS — N2581 Secondary hyperparathyroidism of renal origin: Secondary | ICD-10-CM | POA: Diagnosis not present

## 2022-06-04 DIAGNOSIS — D689 Coagulation defect, unspecified: Secondary | ICD-10-CM | POA: Diagnosis not present

## 2022-06-04 DIAGNOSIS — T8249XA Other complication of vascular dialysis catheter, initial encounter: Secondary | ICD-10-CM | POA: Diagnosis not present

## 2022-06-04 DIAGNOSIS — Z992 Dependence on renal dialysis: Secondary | ICD-10-CM | POA: Diagnosis not present

## 2022-06-07 DIAGNOSIS — N2581 Secondary hyperparathyroidism of renal origin: Secondary | ICD-10-CM | POA: Diagnosis not present

## 2022-06-07 DIAGNOSIS — Z992 Dependence on renal dialysis: Secondary | ICD-10-CM | POA: Diagnosis not present

## 2022-06-07 DIAGNOSIS — N186 End stage renal disease: Secondary | ICD-10-CM | POA: Diagnosis not present

## 2022-06-07 DIAGNOSIS — D689 Coagulation defect, unspecified: Secondary | ICD-10-CM | POA: Diagnosis not present

## 2022-06-07 DIAGNOSIS — D688 Other specified coagulation defects: Secondary | ICD-10-CM | POA: Diagnosis not present

## 2022-06-07 DIAGNOSIS — T8249XA Other complication of vascular dialysis catheter, initial encounter: Secondary | ICD-10-CM | POA: Diagnosis not present

## 2022-06-09 DIAGNOSIS — N2581 Secondary hyperparathyroidism of renal origin: Secondary | ICD-10-CM | POA: Diagnosis not present

## 2022-06-09 DIAGNOSIS — D689 Coagulation defect, unspecified: Secondary | ICD-10-CM | POA: Diagnosis not present

## 2022-06-09 DIAGNOSIS — N186 End stage renal disease: Secondary | ICD-10-CM | POA: Diagnosis not present

## 2022-06-09 DIAGNOSIS — D688 Other specified coagulation defects: Secondary | ICD-10-CM | POA: Diagnosis not present

## 2022-06-09 DIAGNOSIS — T8249XA Other complication of vascular dialysis catheter, initial encounter: Secondary | ICD-10-CM | POA: Diagnosis not present

## 2022-06-09 DIAGNOSIS — Z992 Dependence on renal dialysis: Secondary | ICD-10-CM | POA: Diagnosis not present

## 2022-06-11 DIAGNOSIS — D689 Coagulation defect, unspecified: Secondary | ICD-10-CM | POA: Diagnosis not present

## 2022-06-11 DIAGNOSIS — T8249XA Other complication of vascular dialysis catheter, initial encounter: Secondary | ICD-10-CM | POA: Diagnosis not present

## 2022-06-11 DIAGNOSIS — N2581 Secondary hyperparathyroidism of renal origin: Secondary | ICD-10-CM | POA: Diagnosis not present

## 2022-06-11 DIAGNOSIS — N186 End stage renal disease: Secondary | ICD-10-CM | POA: Diagnosis not present

## 2022-06-11 DIAGNOSIS — D688 Other specified coagulation defects: Secondary | ICD-10-CM | POA: Diagnosis not present

## 2022-06-11 DIAGNOSIS — Z992 Dependence on renal dialysis: Secondary | ICD-10-CM | POA: Diagnosis not present

## 2022-06-14 DIAGNOSIS — T8249XA Other complication of vascular dialysis catheter, initial encounter: Secondary | ICD-10-CM | POA: Diagnosis not present

## 2022-06-14 DIAGNOSIS — D688 Other specified coagulation defects: Secondary | ICD-10-CM | POA: Diagnosis not present

## 2022-06-14 DIAGNOSIS — Z992 Dependence on renal dialysis: Secondary | ICD-10-CM | POA: Diagnosis not present

## 2022-06-14 DIAGNOSIS — N2581 Secondary hyperparathyroidism of renal origin: Secondary | ICD-10-CM | POA: Diagnosis not present

## 2022-06-14 DIAGNOSIS — N186 End stage renal disease: Secondary | ICD-10-CM | POA: Diagnosis not present

## 2022-06-14 DIAGNOSIS — D689 Coagulation defect, unspecified: Secondary | ICD-10-CM | POA: Diagnosis not present

## 2022-06-16 DIAGNOSIS — T8249XA Other complication of vascular dialysis catheter, initial encounter: Secondary | ICD-10-CM | POA: Diagnosis not present

## 2022-06-16 DIAGNOSIS — D688 Other specified coagulation defects: Secondary | ICD-10-CM | POA: Diagnosis not present

## 2022-06-16 DIAGNOSIS — N2581 Secondary hyperparathyroidism of renal origin: Secondary | ICD-10-CM | POA: Diagnosis not present

## 2022-06-16 DIAGNOSIS — N186 End stage renal disease: Secondary | ICD-10-CM | POA: Diagnosis not present

## 2022-06-16 DIAGNOSIS — D689 Coagulation defect, unspecified: Secondary | ICD-10-CM | POA: Diagnosis not present

## 2022-06-16 DIAGNOSIS — Z992 Dependence on renal dialysis: Secondary | ICD-10-CM | POA: Diagnosis not present

## 2022-06-18 DIAGNOSIS — Z992 Dependence on renal dialysis: Secondary | ICD-10-CM | POA: Diagnosis not present

## 2022-06-18 DIAGNOSIS — N2581 Secondary hyperparathyroidism of renal origin: Secondary | ICD-10-CM | POA: Diagnosis not present

## 2022-06-18 DIAGNOSIS — N186 End stage renal disease: Secondary | ICD-10-CM | POA: Diagnosis not present

## 2022-06-18 DIAGNOSIS — D688 Other specified coagulation defects: Secondary | ICD-10-CM | POA: Diagnosis not present

## 2022-06-18 DIAGNOSIS — D689 Coagulation defect, unspecified: Secondary | ICD-10-CM | POA: Diagnosis not present

## 2022-06-18 DIAGNOSIS — T8249XA Other complication of vascular dialysis catheter, initial encounter: Secondary | ICD-10-CM | POA: Diagnosis not present

## 2022-06-19 DIAGNOSIS — N186 End stage renal disease: Secondary | ICD-10-CM | POA: Diagnosis not present

## 2022-06-19 DIAGNOSIS — Z992 Dependence on renal dialysis: Secondary | ICD-10-CM | POA: Diagnosis not present

## 2022-06-19 DIAGNOSIS — I120 Hypertensive chronic kidney disease with stage 5 chronic kidney disease or end stage renal disease: Secondary | ICD-10-CM | POA: Diagnosis not present

## 2022-06-21 DIAGNOSIS — N2581 Secondary hyperparathyroidism of renal origin: Secondary | ICD-10-CM | POA: Diagnosis not present

## 2022-06-21 DIAGNOSIS — Z23 Encounter for immunization: Secondary | ICD-10-CM | POA: Diagnosis not present

## 2022-06-21 DIAGNOSIS — Z992 Dependence on renal dialysis: Secondary | ICD-10-CM | POA: Diagnosis not present

## 2022-06-21 DIAGNOSIS — N186 End stage renal disease: Secondary | ICD-10-CM | POA: Diagnosis not present

## 2022-06-21 DIAGNOSIS — D688 Other specified coagulation defects: Secondary | ICD-10-CM | POA: Diagnosis not present

## 2022-06-23 DIAGNOSIS — Z23 Encounter for immunization: Secondary | ICD-10-CM | POA: Diagnosis not present

## 2022-06-23 DIAGNOSIS — N2581 Secondary hyperparathyroidism of renal origin: Secondary | ICD-10-CM | POA: Diagnosis not present

## 2022-06-23 DIAGNOSIS — N186 End stage renal disease: Secondary | ICD-10-CM | POA: Diagnosis not present

## 2022-06-23 DIAGNOSIS — D688 Other specified coagulation defects: Secondary | ICD-10-CM | POA: Diagnosis not present

## 2022-06-23 DIAGNOSIS — Z992 Dependence on renal dialysis: Secondary | ICD-10-CM | POA: Diagnosis not present

## 2022-06-25 DIAGNOSIS — Z992 Dependence on renal dialysis: Secondary | ICD-10-CM | POA: Diagnosis not present

## 2022-06-25 DIAGNOSIS — Z23 Encounter for immunization: Secondary | ICD-10-CM | POA: Diagnosis not present

## 2022-06-25 DIAGNOSIS — D688 Other specified coagulation defects: Secondary | ICD-10-CM | POA: Diagnosis not present

## 2022-06-25 DIAGNOSIS — N186 End stage renal disease: Secondary | ICD-10-CM | POA: Diagnosis not present

## 2022-06-25 DIAGNOSIS — N2581 Secondary hyperparathyroidism of renal origin: Secondary | ICD-10-CM | POA: Diagnosis not present

## 2022-06-28 DIAGNOSIS — D688 Other specified coagulation defects: Secondary | ICD-10-CM | POA: Diagnosis not present

## 2022-06-28 DIAGNOSIS — N2581 Secondary hyperparathyroidism of renal origin: Secondary | ICD-10-CM | POA: Diagnosis not present

## 2022-06-28 DIAGNOSIS — N186 End stage renal disease: Secondary | ICD-10-CM | POA: Diagnosis not present

## 2022-06-28 DIAGNOSIS — Z23 Encounter for immunization: Secondary | ICD-10-CM | POA: Diagnosis not present

## 2022-06-28 DIAGNOSIS — Z992 Dependence on renal dialysis: Secondary | ICD-10-CM | POA: Diagnosis not present

## 2022-06-30 DIAGNOSIS — N2581 Secondary hyperparathyroidism of renal origin: Secondary | ICD-10-CM | POA: Diagnosis not present

## 2022-06-30 DIAGNOSIS — D688 Other specified coagulation defects: Secondary | ICD-10-CM | POA: Diagnosis not present

## 2022-06-30 DIAGNOSIS — Z23 Encounter for immunization: Secondary | ICD-10-CM | POA: Diagnosis not present

## 2022-06-30 DIAGNOSIS — N186 End stage renal disease: Secondary | ICD-10-CM | POA: Diagnosis not present

## 2022-06-30 DIAGNOSIS — Z992 Dependence on renal dialysis: Secondary | ICD-10-CM | POA: Diagnosis not present

## 2022-06-30 LAB — BASIC METABOLIC PANEL
Chloride: 103 (ref 99–108)
Potassium: 4.8 mEq/L (ref 3.5–5.1)
Sodium: 140 (ref 137–147)

## 2022-06-30 LAB — CBC AND DIFFERENTIAL
HCT: 46 (ref 36–46)
WBC: 8

## 2022-06-30 LAB — CBC: RBC: 5.21 — AB (ref 3.87–5.11)

## 2022-06-30 LAB — COMPREHENSIVE METABOLIC PANEL: Calcium: 9.2 (ref 8.7–10.7)

## 2022-07-02 DIAGNOSIS — N2581 Secondary hyperparathyroidism of renal origin: Secondary | ICD-10-CM | POA: Diagnosis not present

## 2022-07-02 DIAGNOSIS — D688 Other specified coagulation defects: Secondary | ICD-10-CM | POA: Diagnosis not present

## 2022-07-02 DIAGNOSIS — N186 End stage renal disease: Secondary | ICD-10-CM | POA: Diagnosis not present

## 2022-07-02 DIAGNOSIS — Z992 Dependence on renal dialysis: Secondary | ICD-10-CM | POA: Diagnosis not present

## 2022-07-02 DIAGNOSIS — Z23 Encounter for immunization: Secondary | ICD-10-CM | POA: Diagnosis not present

## 2022-07-05 DIAGNOSIS — N186 End stage renal disease: Secondary | ICD-10-CM | POA: Diagnosis not present

## 2022-07-05 DIAGNOSIS — Z23 Encounter for immunization: Secondary | ICD-10-CM | POA: Diagnosis not present

## 2022-07-05 DIAGNOSIS — N2581 Secondary hyperparathyroidism of renal origin: Secondary | ICD-10-CM | POA: Diagnosis not present

## 2022-07-05 DIAGNOSIS — Z992 Dependence on renal dialysis: Secondary | ICD-10-CM | POA: Diagnosis not present

## 2022-07-05 DIAGNOSIS — D688 Other specified coagulation defects: Secondary | ICD-10-CM | POA: Diagnosis not present

## 2022-07-06 ENCOUNTER — Ambulatory Visit
Admission: RE | Admit: 2022-07-06 | Discharge: 2022-07-06 | Disposition: A | Discharge status: Home / Self Care | Payer: Medicare Other | Source: Ambulatory Visit | Attending: Nurse Practitioner | Admitting: Nurse Practitioner

## 2022-07-06 DIAGNOSIS — Z1231 Encounter for screening mammogram for malignant neoplasm of breast: Secondary | ICD-10-CM

## 2022-07-07 DIAGNOSIS — D688 Other specified coagulation defects: Secondary | ICD-10-CM | POA: Diagnosis not present

## 2022-07-07 DIAGNOSIS — Z992 Dependence on renal dialysis: Secondary | ICD-10-CM | POA: Diagnosis not present

## 2022-07-07 DIAGNOSIS — Z23 Encounter for immunization: Secondary | ICD-10-CM | POA: Diagnosis not present

## 2022-07-07 DIAGNOSIS — N186 End stage renal disease: Secondary | ICD-10-CM | POA: Diagnosis not present

## 2022-07-07 DIAGNOSIS — N2581 Secondary hyperparathyroidism of renal origin: Secondary | ICD-10-CM | POA: Diagnosis not present

## 2022-07-12 DIAGNOSIS — Z23 Encounter for immunization: Secondary | ICD-10-CM | POA: Diagnosis not present

## 2022-07-12 DIAGNOSIS — D688 Other specified coagulation defects: Secondary | ICD-10-CM | POA: Diagnosis not present

## 2022-07-12 DIAGNOSIS — Z992 Dependence on renal dialysis: Secondary | ICD-10-CM | POA: Diagnosis not present

## 2022-07-12 DIAGNOSIS — N186 End stage renal disease: Secondary | ICD-10-CM | POA: Diagnosis not present

## 2022-07-12 DIAGNOSIS — N2581 Secondary hyperparathyroidism of renal origin: Secondary | ICD-10-CM | POA: Diagnosis not present

## 2022-07-14 DIAGNOSIS — N2581 Secondary hyperparathyroidism of renal origin: Secondary | ICD-10-CM | POA: Diagnosis not present

## 2022-07-14 DIAGNOSIS — N186 End stage renal disease: Secondary | ICD-10-CM | POA: Diagnosis not present

## 2022-07-14 DIAGNOSIS — Z992 Dependence on renal dialysis: Secondary | ICD-10-CM | POA: Diagnosis not present

## 2022-07-14 DIAGNOSIS — D688 Other specified coagulation defects: Secondary | ICD-10-CM | POA: Diagnosis not present

## 2022-07-14 DIAGNOSIS — Z23 Encounter for immunization: Secondary | ICD-10-CM | POA: Diagnosis not present

## 2022-07-14 LAB — CBC AND DIFFERENTIAL: Hemoglobin: 15.4 (ref 12.0–16.0)

## 2022-07-15 ENCOUNTER — Encounter: Payer: Self-pay | Admitting: Nurse Practitioner

## 2022-07-15 ENCOUNTER — Ambulatory Visit (INDEPENDENT_AMBULATORY_CARE_PROVIDER_SITE_OTHER): Payer: Medicare Other | Admitting: Nurse Practitioner

## 2022-07-15 VITALS — BP 90/60 | HR 76 | Temp 96.0°F | Ht 63.5 in | Wt 169.0 lb

## 2022-07-15 DIAGNOSIS — I150 Renovascular hypertension: Secondary | ICD-10-CM | POA: Diagnosis not present

## 2022-07-15 DIAGNOSIS — Z0000 Encounter for general adult medical examination without abnormal findings: Secondary | ICD-10-CM

## 2022-07-15 DIAGNOSIS — E782 Mixed hyperlipidemia: Secondary | ICD-10-CM

## 2022-07-15 NOTE — Assessment & Plan Note (Signed)
Declined to take pravastatin

## 2022-07-15 NOTE — Patient Instructions (Addendum)
Sign medical release to get lab results from dialysis center.   Ms. Victoria [name] , Thank you for taking time to come for your Medicare Wellness Visit. I appreciate your ongoing commitment to your health goals. Please review the following plan we discussed and let me know if I can assist you in the future.   These are the goals we discussed: Maintain appt for Bone Density. Plans to move to [place] next Spring to live with her daughter Victoria [name].   This is a list of the screening recommended for you and due dates:  Health Maintenance  Topic Date Due   DEXA scan (bone density measurement)  Never done   COVID-19 Vaccine (5 - Moderna series) 07/31/2022*   Cologuard (Stool DNA test)  06/13/2023   Medicare Annual Wellness Visit  08/15/2023   Mammogram  07/06/2024   Tetanus Vaccine  03/31/2026   Pneumonia Vaccine  Completed   Flu Shot  Completed   Hepatitis C Screening: USPSTF Recommendation to screen - Ages 18-79 yo.  Completed   Zoster (Shingles) Vaccine  Completed   HPV Vaccine  Aged Out   Colon Cancer Screening  Discontinued  *Topic was postponed. The date shown is not the original due date.

## 2022-07-15 NOTE — Progress Notes (Signed)
Established Patient Visit  Patient: Victoria [name]   DOB: 10/27/1955   66 y.o. Female  MRN: [id number] Visit Date: 07/18/2022  Subjective:    Chief Complaint  Patient presents with   Office Visit    AWV, Hyperlipidemia  Pt not fasting  No concerns Wants Pneumonia vaccine    HPI She has completed Prevnar and pneumovax vaccines. No need for additional vaccines  Hyperlipidemia Declined to take pravastatin  Hypertensive disorder No medication needed due to chronic hypotension BP Readings from Last 3 Encounters:  07/15/22 90/60  05/13/22 (!) 95/58  04/13/22 (!) 93/47   obtain lab results from dialysis center  BP Readings from Last 3 Encounters:  07/15/22 90/60  05/13/22 (!) 95/58  04/13/22 (!) 93/47    Here for medicare wellness, no new complaints. Please see A/P for status and treatment of chronic medical problems.   Diet: heart healthy or DM if diabetic Physical activity: sedentary, home ADLs Depression/mood screen: negative Hearing: intact to whispered voice Visual acuity: grossly normal, performs annual eye exam  ADLs: capable, able to drive Fall risk: none Home safety: good Cognitive evaluation: intact to orientation, naming, recall and repetition EOL planning: adv directives discussion  Assistance: Food stamps and [place] health Card to help with Utility bills and medication  I have personally reviewed and have noted 1. The patient's medical and social history - reviewed today no changes 2. Their use of alcohol, tobacco or illicit drugs 3. Their current medications and supplements 4. The patient's functional ability including ADL's, fall risks, home safety risks and hearing or visual impairment. 5. Diet and physical activities 6. Evidence for depression or mood disorders 7. Care team reviewed and updated (available in snapshot)   Repeat Fecal DNA completed due 2024 Up to date with mammogram Immunization History  Administered Date(s)  Administered   Fluad Quad(high Dose 65+) 07/15/2020, 06/02/2021   Influenza, Quadrivalent, Recombinant, Inj, Pf 07/12/2022   Influenza,inj,Quad PF,6+ Mos 06/29/2019, 06/29/2019   Influenza,inj,quad, With Preservative 06/20/2017   Influenza-Unspecified 07/22/2015   Moderna Sars-Covid-2 Vaccination 11/16/2019, 12/14/2019, 06/20/2020   PFIZER(Purple Top)SARS-COV-2 Vaccination 06/13/2020   Pneumococcal Conjugate-13 01/15/2015   Pneumococcal Polysaccharide-23 07/15/2020   Td 09/29/2012   Td (Adult), 2 Lf Tetanus Toxid, Preservative Free 09/29/2012   Tdap 03/31/2016   Zoster Recombinat (Shingrix) 07/27/2019, 10/24/2019    Reviewed medical, surgical, and social history today     05/01/2021    3:41 PM 06/02/2021    8:18 AM 09/04/2021   11:40 AM 02/17/2022   10:35 AM 04/13/2022    6:31 AM  Fall Risk  Falls in the past year?  0     Was there an injury with Fall?  0     Fall Risk Category Calculator  0     Fall Risk Category  Low     Patient Fall Risk Level Low fall risk Low fall risk Low fall risk Low fall risk Low fall risk  Patient at Risk for Falls Due to  No Fall Risks     Fall risk Follow up  Falls evaluation completed          07/15/2022    1:41 PM 01/13/2022    1:08 PM 06/24/2020    1:37 PM  Depression screen PHQ 2/9  Decreased Interest 0 0 0  Down, Depressed, Hopeless 0 0 0  PHQ - 2 Score 0 0 0  Altered sleeping 0  Tired, decreased energy 0    Change in appetite 0    Feeling bad or failure about yourself  0    Trouble concentrating 0    Moving slowly or fidgety/restless 0    Suicidal thoughts 0    PHQ-9 Score 0    Difficult doing work/chores Not difficult at all      Medications: Outpatient Medications Prior to Visit  Medication Sig   sevelamer carbonate (RENVELA) 2.4 g PACK Take by mouth.   VITAMIN D PO Take by mouth.   [DISCONTINUED] cetirizine (ZYRTEC) 5 MG tablet Take 5 mg by mouth at bedtime. (Patient not taking: Reported on 07/15/2022)   [DISCONTINUED]  HYDROcodone-acetaminophen (NORCO/VICODIN) 5-325 MG tablet Take 1 tablet by mouth every 4 (four) hours as needed for moderate pain. (Patient not taking: Reported on 07/15/2022)   [DISCONTINUED] pravastatin (PRAVACHOL) 20 MG tablet Take 1 tablet (20 mg total) by mouth 3 (three) times a week. (Patient not taking: Reported on 07/15/2022)   No facility-administered medications prior to visit.   Reviewed past medical and social history.   ROS per HPI above  Last CBC Lab Results  Component Value Date   WBC 8.0 06/30/2022   HGB 15.4 07/14/2022   HCT 46 06/30/2022   MCV 78.4 (L) 05/30/2020   MCH 24.1 (L) 05/30/2020   RDW 18.9 (H) 05/30/2020   PLT 363 66/29/4765   Last metabolic panel Lab Results  Component Value Date   GLUCOSE 86 04/13/2022   NA 140 06/30/2022   K 4.8 06/30/2022   CL 103 06/30/2022   CO2 28 05/30/2020   BUN 40 (H) 04/13/2022   CREATININE 11.00 (H) 04/13/2022   GFRNONAA 6 (L) 05/30/2020   CALCIUM 9.2 06/30/2022   PHOS 3.7 02/25/2020   PROT 7.1 05/30/2020   ALBUMIN 3.1 (L) 05/30/2020   BILITOT 0.6 05/30/2020   ALKPHOS 56 05/30/2020   AST 17 05/30/2020   ALT 11 05/30/2020   ANIONGAP 14 05/30/2020   Last lipids Lab Results  Component Value Date   CHOL 163 07/15/2020   HDL 47.30 07/15/2020   LDLCALC 91 07/15/2020   TRIG 120.0 07/15/2020   CHOLHDL 3 07/15/2020      Objective:  BP 90/60   Pulse 76   Temp (!) 96 F (35.6 C) (Temporal)   Ht 5' 3.5" (1.613 m)   Wt 169 lb (76.7 kg)   LMP  (LMP Unknown)   SpO2 93%   BMI 29.47 kg/m      Physical Exam Cardiovascular:     Rate and Rhythm: Normal rate and regular rhythm.     Pulses: Normal pulses.     Heart sounds: Normal heart sounds.  Pulmonary:     Effort: Pulmonary effort is normal.     Breath sounds: Normal breath sounds.  Abdominal:     General: Bowel sounds are normal.     Palpations: Abdomen is soft.  Musculoskeletal:     Right lower leg: No edema.     Left lower leg: No edema.  Skin:     General: Skin is warm and dry.  Neurological:     Mental Status: She is alert and oriented to person, place, and time.  Psychiatric:        Mood and Affect: Mood normal.        Behavior: Behavior normal.        Thought Content: Thought content normal.     No results found for any visits on 07/15/22.    Assessment &  Plan:    Problem List Items Addressed This Visit       Cardiovascular and Mediastinum   Hypertensive disorder    No medication needed due to chronic hypotension BP Readings from Last 3 Encounters:  07/15/22 90/60  05/13/22 (!) 95/58  04/13/22 (!) 93/47   obtain lab results from dialysis center        Other   Hyperlipidemia    Declined to take pravastatin      Other Visit Diagnoses     Encounter for subsequent annual wellness visit (AWV) in Medicare patient    -  Primary      These are the goals we discussed: Maintain appt for Bone Density. Plans to move to [place] next Spring to live with her daughter Gabriel Cirri.  Return in about 6 months (around 01/14/2023) for HTN, hyperlipidemia (fasting), schedule next AWV with wellness nurse.     Wilfred Lacy, NP

## 2022-07-16 DIAGNOSIS — N186 End stage renal disease: Secondary | ICD-10-CM | POA: Diagnosis not present

## 2022-07-16 DIAGNOSIS — N2581 Secondary hyperparathyroidism of renal origin: Secondary | ICD-10-CM | POA: Diagnosis not present

## 2022-07-16 DIAGNOSIS — Z23 Encounter for immunization: Secondary | ICD-10-CM | POA: Diagnosis not present

## 2022-07-16 DIAGNOSIS — Z992 Dependence on renal dialysis: Secondary | ICD-10-CM | POA: Diagnosis not present

## 2022-07-16 DIAGNOSIS — D688 Other specified coagulation defects: Secondary | ICD-10-CM | POA: Diagnosis not present

## 2022-07-18 NOTE — Assessment & Plan Note (Signed)
No medication needed due to chronic hypotension BP Readings from Last 3 Encounters:  07/15/22 90/60  05/13/22 (!) 95/58  04/13/22 (!) 93/47   obtain lab results from dialysis center

## 2022-07-19 ENCOUNTER — Ambulatory Visit
Admission: RE | Admit: 2022-07-19 | Discharge: 2022-07-19 | Disposition: A | Discharge status: Home / Self Care | Payer: Medicare Other | Source: Ambulatory Visit | Attending: Nurse Practitioner | Admitting: Nurse Practitioner

## 2022-07-19 DIAGNOSIS — Z992 Dependence on renal dialysis: Secondary | ICD-10-CM | POA: Diagnosis not present

## 2022-07-19 DIAGNOSIS — N2581 Secondary hyperparathyroidism of renal origin: Secondary | ICD-10-CM | POA: Diagnosis not present

## 2022-07-19 DIAGNOSIS — D688 Other specified coagulation defects: Secondary | ICD-10-CM | POA: Diagnosis not present

## 2022-07-19 DIAGNOSIS — Z780 Asymptomatic menopausal state: Secondary | ICD-10-CM | POA: Diagnosis not present

## 2022-07-19 DIAGNOSIS — Z13820 Encounter for screening for osteoporosis: Secondary | ICD-10-CM

## 2022-07-19 DIAGNOSIS — N186 End stage renal disease: Secondary | ICD-10-CM | POA: Diagnosis not present

## 2022-07-19 DIAGNOSIS — M8589 Other specified disorders of bone density and structure, multiple sites: Secondary | ICD-10-CM | POA: Diagnosis not present

## 2022-07-19 DIAGNOSIS — Z23 Encounter for immunization: Secondary | ICD-10-CM | POA: Diagnosis not present

## 2022-07-20 DIAGNOSIS — Z992 Dependence on renal dialysis: Secondary | ICD-10-CM | POA: Diagnosis not present

## 2022-07-20 DIAGNOSIS — I120 Hypertensive chronic kidney disease with stage 5 chronic kidney disease or end stage renal disease: Secondary | ICD-10-CM | POA: Diagnosis not present

## 2022-07-20 DIAGNOSIS — N186 End stage renal disease: Secondary | ICD-10-CM | POA: Diagnosis not present

## 2022-07-21 DIAGNOSIS — N2581 Secondary hyperparathyroidism of renal origin: Secondary | ICD-10-CM | POA: Diagnosis not present

## 2022-07-21 DIAGNOSIS — D688 Other specified coagulation defects: Secondary | ICD-10-CM | POA: Diagnosis not present

## 2022-07-21 DIAGNOSIS — Z992 Dependence on renal dialysis: Secondary | ICD-10-CM | POA: Diagnosis not present

## 2022-07-21 DIAGNOSIS — N186 End stage renal disease: Secondary | ICD-10-CM | POA: Diagnosis not present

## 2022-07-23 DIAGNOSIS — D688 Other specified coagulation defects: Secondary | ICD-10-CM | POA: Diagnosis not present

## 2022-07-23 DIAGNOSIS — N186 End stage renal disease: Secondary | ICD-10-CM | POA: Diagnosis not present

## 2022-07-23 DIAGNOSIS — N2581 Secondary hyperparathyroidism of renal origin: Secondary | ICD-10-CM | POA: Diagnosis not present

## 2022-07-23 DIAGNOSIS — Z992 Dependence on renal dialysis: Secondary | ICD-10-CM | POA: Diagnosis not present

## 2022-07-26 DIAGNOSIS — N186 End stage renal disease: Secondary | ICD-10-CM | POA: Diagnosis not present

## 2022-07-26 DIAGNOSIS — N2581 Secondary hyperparathyroidism of renal origin: Secondary | ICD-10-CM | POA: Diagnosis not present

## 2022-07-26 DIAGNOSIS — D688 Other specified coagulation defects: Secondary | ICD-10-CM | POA: Diagnosis not present

## 2022-07-26 DIAGNOSIS — Z992 Dependence on renal dialysis: Secondary | ICD-10-CM | POA: Diagnosis not present

## 2022-07-28 DIAGNOSIS — Z992 Dependence on renal dialysis: Secondary | ICD-10-CM | POA: Diagnosis not present

## 2022-07-28 DIAGNOSIS — D688 Other specified coagulation defects: Secondary | ICD-10-CM | POA: Diagnosis not present

## 2022-07-28 DIAGNOSIS — N2581 Secondary hyperparathyroidism of renal origin: Secondary | ICD-10-CM | POA: Diagnosis not present

## 2022-07-28 DIAGNOSIS — N186 End stage renal disease: Secondary | ICD-10-CM | POA: Diagnosis not present

## 2022-07-30 DIAGNOSIS — D688 Other specified coagulation defects: Secondary | ICD-10-CM | POA: Diagnosis not present

## 2022-07-30 DIAGNOSIS — Z992 Dependence on renal dialysis: Secondary | ICD-10-CM | POA: Diagnosis not present

## 2022-07-30 DIAGNOSIS — N186 End stage renal disease: Secondary | ICD-10-CM | POA: Diagnosis not present

## 2022-07-30 DIAGNOSIS — N2581 Secondary hyperparathyroidism of renal origin: Secondary | ICD-10-CM | POA: Diagnosis not present

## 2022-08-02 DIAGNOSIS — Z992 Dependence on renal dialysis: Secondary | ICD-10-CM | POA: Diagnosis not present

## 2022-08-02 DIAGNOSIS — N2581 Secondary hyperparathyroidism of renal origin: Secondary | ICD-10-CM | POA: Diagnosis not present

## 2022-08-02 DIAGNOSIS — D688 Other specified coagulation defects: Secondary | ICD-10-CM | POA: Diagnosis not present

## 2022-08-02 DIAGNOSIS — N186 End stage renal disease: Secondary | ICD-10-CM | POA: Diagnosis not present

## 2022-08-04 DIAGNOSIS — Z992 Dependence on renal dialysis: Secondary | ICD-10-CM | POA: Diagnosis not present

## 2022-08-04 DIAGNOSIS — N186 End stage renal disease: Secondary | ICD-10-CM | POA: Diagnosis not present

## 2022-08-04 DIAGNOSIS — D688 Other specified coagulation defects: Secondary | ICD-10-CM | POA: Diagnosis not present

## 2022-08-04 DIAGNOSIS — N2581 Secondary hyperparathyroidism of renal origin: Secondary | ICD-10-CM | POA: Diagnosis not present

## 2022-08-06 DIAGNOSIS — Z992 Dependence on renal dialysis: Secondary | ICD-10-CM | POA: Diagnosis not present

## 2022-08-06 DIAGNOSIS — N186 End stage renal disease: Secondary | ICD-10-CM | POA: Diagnosis not present

## 2022-08-06 DIAGNOSIS — D688 Other specified coagulation defects: Secondary | ICD-10-CM | POA: Diagnosis not present

## 2022-08-06 DIAGNOSIS — N2581 Secondary hyperparathyroidism of renal origin: Secondary | ICD-10-CM | POA: Diagnosis not present

## 2022-08-08 DIAGNOSIS — D688 Other specified coagulation defects: Secondary | ICD-10-CM | POA: Diagnosis not present

## 2022-08-08 DIAGNOSIS — N186 End stage renal disease: Secondary | ICD-10-CM | POA: Diagnosis not present

## 2022-08-08 DIAGNOSIS — Z992 Dependence on renal dialysis: Secondary | ICD-10-CM | POA: Diagnosis not present

## 2022-08-08 DIAGNOSIS — N2581 Secondary hyperparathyroidism of renal origin: Secondary | ICD-10-CM | POA: Diagnosis not present

## 2022-08-09 LAB — HM DIABETES FOOT EXAM: HM Diabetic Foot Exam: NORMAL

## 2022-08-10 DIAGNOSIS — N186 End stage renal disease: Secondary | ICD-10-CM | POA: Diagnosis not present

## 2022-08-10 DIAGNOSIS — D688 Other specified coagulation defects: Secondary | ICD-10-CM | POA: Diagnosis not present

## 2022-08-10 DIAGNOSIS — Z992 Dependence on renal dialysis: Secondary | ICD-10-CM | POA: Diagnosis not present

## 2022-08-10 DIAGNOSIS — N2581 Secondary hyperparathyroidism of renal origin: Secondary | ICD-10-CM | POA: Diagnosis not present

## 2022-08-13 DIAGNOSIS — N2581 Secondary hyperparathyroidism of renal origin: Secondary | ICD-10-CM | POA: Diagnosis not present

## 2022-08-13 DIAGNOSIS — D688 Other specified coagulation defects: Secondary | ICD-10-CM | POA: Diagnosis not present

## 2022-08-13 DIAGNOSIS — Z992 Dependence on renal dialysis: Secondary | ICD-10-CM | POA: Diagnosis not present

## 2022-08-13 DIAGNOSIS — N186 End stage renal disease: Secondary | ICD-10-CM | POA: Diagnosis not present

## 2022-08-16 DIAGNOSIS — N2581 Secondary hyperparathyroidism of renal origin: Secondary | ICD-10-CM | POA: Diagnosis not present

## 2022-08-16 DIAGNOSIS — Z992 Dependence on renal dialysis: Secondary | ICD-10-CM | POA: Diagnosis not present

## 2022-08-16 DIAGNOSIS — D688 Other specified coagulation defects: Secondary | ICD-10-CM | POA: Diagnosis not present

## 2022-08-16 DIAGNOSIS — N186 End stage renal disease: Secondary | ICD-10-CM | POA: Diagnosis not present

## 2022-08-18 DIAGNOSIS — Z992 Dependence on renal dialysis: Secondary | ICD-10-CM | POA: Diagnosis not present

## 2022-08-18 DIAGNOSIS — D688 Other specified coagulation defects: Secondary | ICD-10-CM | POA: Diagnosis not present

## 2022-08-18 DIAGNOSIS — N186 End stage renal disease: Secondary | ICD-10-CM | POA: Diagnosis not present

## 2022-08-18 DIAGNOSIS — N2581 Secondary hyperparathyroidism of renal origin: Secondary | ICD-10-CM | POA: Diagnosis not present

## 2022-08-19 DIAGNOSIS — Z992 Dependence on renal dialysis: Secondary | ICD-10-CM | POA: Diagnosis not present

## 2022-08-19 DIAGNOSIS — I120 Hypertensive chronic kidney disease with stage 5 chronic kidney disease or end stage renal disease: Secondary | ICD-10-CM | POA: Diagnosis not present

## 2022-08-19 DIAGNOSIS — N186 End stage renal disease: Secondary | ICD-10-CM | POA: Diagnosis not present

## 2022-08-20 DIAGNOSIS — D688 Other specified coagulation defects: Secondary | ICD-10-CM | POA: Diagnosis not present

## 2022-08-20 DIAGNOSIS — N2581 Secondary hyperparathyroidism of renal origin: Secondary | ICD-10-CM | POA: Diagnosis not present

## 2022-08-20 DIAGNOSIS — N186 End stage renal disease: Secondary | ICD-10-CM | POA: Diagnosis not present

## 2022-08-20 DIAGNOSIS — Z992 Dependence on renal dialysis: Secondary | ICD-10-CM | POA: Diagnosis not present

## 2022-08-23 DIAGNOSIS — N2581 Secondary hyperparathyroidism of renal origin: Secondary | ICD-10-CM | POA: Diagnosis not present

## 2022-08-23 DIAGNOSIS — D688 Other specified coagulation defects: Secondary | ICD-10-CM | POA: Diagnosis not present

## 2022-08-23 DIAGNOSIS — N186 End stage renal disease: Secondary | ICD-10-CM | POA: Diagnosis not present

## 2022-08-23 DIAGNOSIS — Z992 Dependence on renal dialysis: Secondary | ICD-10-CM | POA: Diagnosis not present

## 2022-08-25 DIAGNOSIS — D688 Other specified coagulation defects: Secondary | ICD-10-CM | POA: Diagnosis not present

## 2022-08-25 DIAGNOSIS — Z992 Dependence on renal dialysis: Secondary | ICD-10-CM | POA: Diagnosis not present

## 2022-08-25 DIAGNOSIS — N2581 Secondary hyperparathyroidism of renal origin: Secondary | ICD-10-CM | POA: Diagnosis not present

## 2022-08-25 DIAGNOSIS — N186 End stage renal disease: Secondary | ICD-10-CM | POA: Diagnosis not present

## 2022-08-27 DIAGNOSIS — D688 Other specified coagulation defects: Secondary | ICD-10-CM | POA: Diagnosis not present

## 2022-08-27 DIAGNOSIS — Z992 Dependence on renal dialysis: Secondary | ICD-10-CM | POA: Diagnosis not present

## 2022-08-27 DIAGNOSIS — N186 End stage renal disease: Secondary | ICD-10-CM | POA: Diagnosis not present

## 2022-08-27 DIAGNOSIS — N2581 Secondary hyperparathyroidism of renal origin: Secondary | ICD-10-CM | POA: Diagnosis not present

## 2022-08-30 DIAGNOSIS — D688 Other specified coagulation defects: Secondary | ICD-10-CM | POA: Diagnosis not present

## 2022-08-30 DIAGNOSIS — N186 End stage renal disease: Secondary | ICD-10-CM | POA: Diagnosis not present

## 2022-08-30 DIAGNOSIS — Z992 Dependence on renal dialysis: Secondary | ICD-10-CM | POA: Diagnosis not present

## 2022-08-30 DIAGNOSIS — N2581 Secondary hyperparathyroidism of renal origin: Secondary | ICD-10-CM | POA: Diagnosis not present

## 2022-09-01 DIAGNOSIS — N186 End stage renal disease: Secondary | ICD-10-CM | POA: Diagnosis not present

## 2022-09-01 DIAGNOSIS — D688 Other specified coagulation defects: Secondary | ICD-10-CM | POA: Diagnosis not present

## 2022-09-01 DIAGNOSIS — Z992 Dependence on renal dialysis: Secondary | ICD-10-CM | POA: Diagnosis not present

## 2022-09-01 DIAGNOSIS — N2581 Secondary hyperparathyroidism of renal origin: Secondary | ICD-10-CM | POA: Diagnosis not present

## 2022-09-03 DIAGNOSIS — D688 Other specified coagulation defects: Secondary | ICD-10-CM | POA: Diagnosis not present

## 2022-09-03 DIAGNOSIS — Z992 Dependence on renal dialysis: Secondary | ICD-10-CM | POA: Diagnosis not present

## 2022-09-03 DIAGNOSIS — N2581 Secondary hyperparathyroidism of renal origin: Secondary | ICD-10-CM | POA: Diagnosis not present

## 2022-09-03 DIAGNOSIS — N186 End stage renal disease: Secondary | ICD-10-CM | POA: Diagnosis not present

## 2022-09-06 DIAGNOSIS — Z992 Dependence on renal dialysis: Secondary | ICD-10-CM | POA: Diagnosis not present

## 2022-09-06 DIAGNOSIS — D688 Other specified coagulation defects: Secondary | ICD-10-CM | POA: Diagnosis not present

## 2022-09-06 DIAGNOSIS — N186 End stage renal disease: Secondary | ICD-10-CM | POA: Diagnosis not present

## 2022-09-06 DIAGNOSIS — N2581 Secondary hyperparathyroidism of renal origin: Secondary | ICD-10-CM | POA: Diagnosis not present

## 2022-09-08 DIAGNOSIS — D688 Other specified coagulation defects: Secondary | ICD-10-CM | POA: Diagnosis not present

## 2022-09-08 DIAGNOSIS — N2581 Secondary hyperparathyroidism of renal origin: Secondary | ICD-10-CM | POA: Diagnosis not present

## 2022-09-08 DIAGNOSIS — N186 End stage renal disease: Secondary | ICD-10-CM | POA: Diagnosis not present

## 2022-09-08 DIAGNOSIS — Z992 Dependence on renal dialysis: Secondary | ICD-10-CM | POA: Diagnosis not present

## 2022-09-10 DIAGNOSIS — Z992 Dependence on renal dialysis: Secondary | ICD-10-CM | POA: Diagnosis not present

## 2022-09-10 DIAGNOSIS — N186 End stage renal disease: Secondary | ICD-10-CM | POA: Diagnosis not present

## 2022-09-10 DIAGNOSIS — D688 Other specified coagulation defects: Secondary | ICD-10-CM | POA: Diagnosis not present

## 2022-09-10 DIAGNOSIS — N2581 Secondary hyperparathyroidism of renal origin: Secondary | ICD-10-CM | POA: Diagnosis not present

## 2022-09-12 DIAGNOSIS — D688 Other specified coagulation defects: Secondary | ICD-10-CM | POA: Diagnosis not present

## 2022-09-12 DIAGNOSIS — N186 End stage renal disease: Secondary | ICD-10-CM | POA: Diagnosis not present

## 2022-09-12 DIAGNOSIS — N2581 Secondary hyperparathyroidism of renal origin: Secondary | ICD-10-CM | POA: Diagnosis not present

## 2022-09-12 DIAGNOSIS — Z992 Dependence on renal dialysis: Secondary | ICD-10-CM | POA: Diagnosis not present

## 2022-09-15 DIAGNOSIS — N2581 Secondary hyperparathyroidism of renal origin: Secondary | ICD-10-CM | POA: Diagnosis not present

## 2022-09-15 DIAGNOSIS — D688 Other specified coagulation defects: Secondary | ICD-10-CM | POA: Diagnosis not present

## 2022-09-15 DIAGNOSIS — N186 End stage renal disease: Secondary | ICD-10-CM | POA: Diagnosis not present

## 2022-09-15 DIAGNOSIS — Z992 Dependence on renal dialysis: Secondary | ICD-10-CM | POA: Diagnosis not present

## 2022-09-17 DIAGNOSIS — D688 Other specified coagulation defects: Secondary | ICD-10-CM | POA: Diagnosis not present

## 2022-09-17 DIAGNOSIS — N2581 Secondary hyperparathyroidism of renal origin: Secondary | ICD-10-CM | POA: Diagnosis not present

## 2022-09-17 DIAGNOSIS — Z992 Dependence on renal dialysis: Secondary | ICD-10-CM | POA: Diagnosis not present

## 2022-09-17 DIAGNOSIS — N186 End stage renal disease: Secondary | ICD-10-CM | POA: Diagnosis not present

## 2022-09-19 DIAGNOSIS — Z992 Dependence on renal dialysis: Secondary | ICD-10-CM | POA: Diagnosis not present

## 2022-09-19 DIAGNOSIS — I120 Hypertensive chronic kidney disease with stage 5 chronic kidney disease or end stage renal disease: Secondary | ICD-10-CM | POA: Diagnosis not present

## 2022-09-19 DIAGNOSIS — N2581 Secondary hyperparathyroidism of renal origin: Secondary | ICD-10-CM | POA: Diagnosis not present

## 2022-09-19 DIAGNOSIS — N186 End stage renal disease: Secondary | ICD-10-CM | POA: Diagnosis not present

## 2022-09-19 DIAGNOSIS — D688 Other specified coagulation defects: Secondary | ICD-10-CM | POA: Diagnosis not present

## 2022-09-22 DIAGNOSIS — D688 Other specified coagulation defects: Secondary | ICD-10-CM | POA: Diagnosis not present

## 2022-09-22 DIAGNOSIS — N186 End stage renal disease: Secondary | ICD-10-CM | POA: Diagnosis not present

## 2022-09-22 DIAGNOSIS — Z992 Dependence on renal dialysis: Secondary | ICD-10-CM | POA: Diagnosis not present

## 2022-09-22 DIAGNOSIS — N2581 Secondary hyperparathyroidism of renal origin: Secondary | ICD-10-CM | POA: Diagnosis not present

## 2022-09-24 DIAGNOSIS — Z992 Dependence on renal dialysis: Secondary | ICD-10-CM | POA: Diagnosis not present

## 2022-09-24 DIAGNOSIS — N186 End stage renal disease: Secondary | ICD-10-CM | POA: Diagnosis not present

## 2022-09-24 DIAGNOSIS — N2581 Secondary hyperparathyroidism of renal origin: Secondary | ICD-10-CM | POA: Diagnosis not present

## 2022-09-24 DIAGNOSIS — D688 Other specified coagulation defects: Secondary | ICD-10-CM | POA: Diagnosis not present

## 2022-09-27 DIAGNOSIS — Z992 Dependence on renal dialysis: Secondary | ICD-10-CM | POA: Diagnosis not present

## 2022-09-27 DIAGNOSIS — N2581 Secondary hyperparathyroidism of renal origin: Secondary | ICD-10-CM | POA: Diagnosis not present

## 2022-09-27 DIAGNOSIS — N186 End stage renal disease: Secondary | ICD-10-CM | POA: Diagnosis not present

## 2022-09-27 DIAGNOSIS — D688 Other specified coagulation defects: Secondary | ICD-10-CM | POA: Diagnosis not present

## 2022-09-29 DIAGNOSIS — N2581 Secondary hyperparathyroidism of renal origin: Secondary | ICD-10-CM | POA: Diagnosis not present

## 2022-09-29 DIAGNOSIS — D688 Other specified coagulation defects: Secondary | ICD-10-CM | POA: Diagnosis not present

## 2022-09-29 DIAGNOSIS — N186 End stage renal disease: Secondary | ICD-10-CM | POA: Diagnosis not present

## 2022-09-29 DIAGNOSIS — Z992 Dependence on renal dialysis: Secondary | ICD-10-CM | POA: Diagnosis not present

## 2022-10-04 DIAGNOSIS — D688 Other specified coagulation defects: Secondary | ICD-10-CM | POA: Diagnosis not present

## 2022-10-04 DIAGNOSIS — N2581 Secondary hyperparathyroidism of renal origin: Secondary | ICD-10-CM | POA: Diagnosis not present

## 2022-10-04 DIAGNOSIS — N186 End stage renal disease: Secondary | ICD-10-CM | POA: Diagnosis not present

## 2022-10-04 DIAGNOSIS — Z992 Dependence on renal dialysis: Secondary | ICD-10-CM | POA: Diagnosis not present

## 2022-10-06 DIAGNOSIS — D688 Other specified coagulation defects: Secondary | ICD-10-CM | POA: Diagnosis not present

## 2022-10-06 DIAGNOSIS — N2581 Secondary hyperparathyroidism of renal origin: Secondary | ICD-10-CM | POA: Diagnosis not present

## 2022-10-06 DIAGNOSIS — N186 End stage renal disease: Secondary | ICD-10-CM | POA: Diagnosis not present

## 2022-10-06 DIAGNOSIS — Z992 Dependence on renal dialysis: Secondary | ICD-10-CM | POA: Diagnosis not present

## 2022-10-08 DIAGNOSIS — N186 End stage renal disease: Secondary | ICD-10-CM | POA: Diagnosis not present

## 2022-10-08 DIAGNOSIS — D688 Other specified coagulation defects: Secondary | ICD-10-CM | POA: Diagnosis not present

## 2022-10-08 DIAGNOSIS — Z992 Dependence on renal dialysis: Secondary | ICD-10-CM | POA: Diagnosis not present

## 2022-10-08 DIAGNOSIS — N2581 Secondary hyperparathyroidism of renal origin: Secondary | ICD-10-CM | POA: Diagnosis not present

## 2022-10-11 DIAGNOSIS — N2581 Secondary hyperparathyroidism of renal origin: Secondary | ICD-10-CM | POA: Diagnosis not present

## 2022-10-11 DIAGNOSIS — D688 Other specified coagulation defects: Secondary | ICD-10-CM | POA: Diagnosis not present

## 2022-10-11 DIAGNOSIS — Z992 Dependence on renal dialysis: Secondary | ICD-10-CM | POA: Diagnosis not present

## 2022-10-11 DIAGNOSIS — N186 End stage renal disease: Secondary | ICD-10-CM | POA: Diagnosis not present

## 2022-10-14 DIAGNOSIS — N186 End stage renal disease: Secondary | ICD-10-CM | POA: Diagnosis not present

## 2022-10-14 DIAGNOSIS — I871 Compression of vein: Secondary | ICD-10-CM | POA: Diagnosis not present

## 2022-10-14 DIAGNOSIS — T82868A Thrombosis of vascular prosthetic devices, implants and grafts, initial encounter: Secondary | ICD-10-CM | POA: Diagnosis not present

## 2022-10-14 DIAGNOSIS — D688 Other specified coagulation defects: Secondary | ICD-10-CM | POA: Diagnosis not present

## 2022-10-14 DIAGNOSIS — Z992 Dependence on renal dialysis: Secondary | ICD-10-CM | POA: Diagnosis not present

## 2022-10-14 DIAGNOSIS — N2581 Secondary hyperparathyroidism of renal origin: Secondary | ICD-10-CM | POA: Diagnosis not present

## 2022-10-15 DIAGNOSIS — N2581 Secondary hyperparathyroidism of renal origin: Secondary | ICD-10-CM | POA: Diagnosis not present

## 2022-10-15 DIAGNOSIS — Z992 Dependence on renal dialysis: Secondary | ICD-10-CM | POA: Diagnosis not present

## 2022-10-15 DIAGNOSIS — D688 Other specified coagulation defects: Secondary | ICD-10-CM | POA: Diagnosis not present

## 2022-10-15 DIAGNOSIS — N186 End stage renal disease: Secondary | ICD-10-CM | POA: Diagnosis not present

## 2022-10-18 DIAGNOSIS — N2581 Secondary hyperparathyroidism of renal origin: Secondary | ICD-10-CM | POA: Diagnosis not present

## 2022-10-18 DIAGNOSIS — N186 End stage renal disease: Secondary | ICD-10-CM | POA: Diagnosis not present

## 2022-10-18 DIAGNOSIS — D688 Other specified coagulation defects: Secondary | ICD-10-CM | POA: Diagnosis not present

## 2022-10-18 DIAGNOSIS — Z992 Dependence on renal dialysis: Secondary | ICD-10-CM | POA: Diagnosis not present

## 2022-10-20 DIAGNOSIS — N186 End stage renal disease: Secondary | ICD-10-CM | POA: Diagnosis not present

## 2022-10-20 DIAGNOSIS — Z992 Dependence on renal dialysis: Secondary | ICD-10-CM | POA: Diagnosis not present

## 2022-10-20 DIAGNOSIS — N2581 Secondary hyperparathyroidism of renal origin: Secondary | ICD-10-CM | POA: Diagnosis not present

## 2022-10-20 DIAGNOSIS — D688 Other specified coagulation defects: Secondary | ICD-10-CM | POA: Diagnosis not present

## 2022-10-22 DIAGNOSIS — D688 Other specified coagulation defects: Secondary | ICD-10-CM | POA: Diagnosis not present

## 2022-10-22 DIAGNOSIS — N2581 Secondary hyperparathyroidism of renal origin: Secondary | ICD-10-CM | POA: Diagnosis not present

## 2022-10-22 DIAGNOSIS — T82858A Stenosis of vascular prosthetic devices, implants and grafts, initial encounter: Secondary | ICD-10-CM | POA: Diagnosis not present

## 2022-10-22 DIAGNOSIS — T82868A Thrombosis of vascular prosthetic devices, implants and grafts, initial encounter: Secondary | ICD-10-CM | POA: Diagnosis not present

## 2022-10-22 DIAGNOSIS — Z992 Dependence on renal dialysis: Secondary | ICD-10-CM | POA: Diagnosis not present

## 2022-10-22 DIAGNOSIS — N186 End stage renal disease: Secondary | ICD-10-CM | POA: Diagnosis not present

## 2022-10-25 DIAGNOSIS — N186 End stage renal disease: Secondary | ICD-10-CM | POA: Diagnosis not present

## 2022-10-25 DIAGNOSIS — N2581 Secondary hyperparathyroidism of renal origin: Secondary | ICD-10-CM | POA: Diagnosis not present

## 2022-10-25 DIAGNOSIS — D688 Other specified coagulation defects: Secondary | ICD-10-CM | POA: Diagnosis not present

## 2022-10-25 DIAGNOSIS — Z992 Dependence on renal dialysis: Secondary | ICD-10-CM | POA: Diagnosis not present

## 2022-10-27 DIAGNOSIS — Z992 Dependence on renal dialysis: Secondary | ICD-10-CM | POA: Diagnosis not present

## 2022-10-27 DIAGNOSIS — D688 Other specified coagulation defects: Secondary | ICD-10-CM | POA: Diagnosis not present

## 2022-10-27 DIAGNOSIS — N186 End stage renal disease: Secondary | ICD-10-CM | POA: Diagnosis not present

## 2022-10-27 DIAGNOSIS — N2581 Secondary hyperparathyroidism of renal origin: Secondary | ICD-10-CM | POA: Diagnosis not present

## 2022-10-29 DIAGNOSIS — N186 End stage renal disease: Secondary | ICD-10-CM | POA: Diagnosis not present

## 2022-10-29 DIAGNOSIS — N2581 Secondary hyperparathyroidism of renal origin: Secondary | ICD-10-CM | POA: Diagnosis not present

## 2022-10-29 DIAGNOSIS — Z992 Dependence on renal dialysis: Secondary | ICD-10-CM | POA: Diagnosis not present

## 2022-10-29 DIAGNOSIS — D688 Other specified coagulation defects: Secondary | ICD-10-CM | POA: Diagnosis not present

## 2022-11-01 DIAGNOSIS — N186 End stage renal disease: Secondary | ICD-10-CM | POA: Diagnosis not present

## 2022-11-01 DIAGNOSIS — Z992 Dependence on renal dialysis: Secondary | ICD-10-CM | POA: Diagnosis not present

## 2022-11-01 DIAGNOSIS — N2581 Secondary hyperparathyroidism of renal origin: Secondary | ICD-10-CM | POA: Diagnosis not present

## 2022-11-01 DIAGNOSIS — D688 Other specified coagulation defects: Secondary | ICD-10-CM | POA: Diagnosis not present

## 2022-11-03 DIAGNOSIS — N186 End stage renal disease: Secondary | ICD-10-CM | POA: Diagnosis not present

## 2022-11-03 DIAGNOSIS — N2581 Secondary hyperparathyroidism of renal origin: Secondary | ICD-10-CM | POA: Diagnosis not present

## 2022-11-03 DIAGNOSIS — Z992 Dependence on renal dialysis: Secondary | ICD-10-CM | POA: Diagnosis not present

## 2022-11-03 DIAGNOSIS — D688 Other specified coagulation defects: Secondary | ICD-10-CM | POA: Diagnosis not present

## 2022-11-05 DIAGNOSIS — N186 End stage renal disease: Secondary | ICD-10-CM | POA: Diagnosis not present

## 2022-11-05 DIAGNOSIS — N2581 Secondary hyperparathyroidism of renal origin: Secondary | ICD-10-CM | POA: Diagnosis not present

## 2022-11-05 DIAGNOSIS — Z992 Dependence on renal dialysis: Secondary | ICD-10-CM | POA: Diagnosis not present

## 2022-11-05 DIAGNOSIS — D688 Other specified coagulation defects: Secondary | ICD-10-CM | POA: Diagnosis not present

## 2022-11-08 DIAGNOSIS — N2581 Secondary hyperparathyroidism of renal origin: Secondary | ICD-10-CM | POA: Diagnosis not present

## 2022-11-08 DIAGNOSIS — D688 Other specified coagulation defects: Secondary | ICD-10-CM | POA: Diagnosis not present

## 2022-11-08 DIAGNOSIS — Z992 Dependence on renal dialysis: Secondary | ICD-10-CM | POA: Diagnosis not present

## 2022-11-08 DIAGNOSIS — N186 End stage renal disease: Secondary | ICD-10-CM | POA: Diagnosis not present

## 2022-11-10 DIAGNOSIS — N2581 Secondary hyperparathyroidism of renal origin: Secondary | ICD-10-CM | POA: Diagnosis not present

## 2022-11-10 DIAGNOSIS — D688 Other specified coagulation defects: Secondary | ICD-10-CM | POA: Diagnosis not present

## 2022-11-10 DIAGNOSIS — N186 End stage renal disease: Secondary | ICD-10-CM | POA: Diagnosis not present

## 2022-11-10 DIAGNOSIS — Z992 Dependence on renal dialysis: Secondary | ICD-10-CM | POA: Diagnosis not present

## 2022-11-12 DIAGNOSIS — Z992 Dependence on renal dialysis: Secondary | ICD-10-CM | POA: Diagnosis not present

## 2022-11-12 DIAGNOSIS — N186 End stage renal disease: Secondary | ICD-10-CM | POA: Diagnosis not present

## 2022-11-12 DIAGNOSIS — D688 Other specified coagulation defects: Secondary | ICD-10-CM | POA: Diagnosis not present

## 2022-11-12 DIAGNOSIS — N2581 Secondary hyperparathyroidism of renal origin: Secondary | ICD-10-CM | POA: Diagnosis not present

## 2022-11-15 DIAGNOSIS — Z992 Dependence on renal dialysis: Secondary | ICD-10-CM | POA: Diagnosis not present

## 2022-11-15 DIAGNOSIS — D688 Other specified coagulation defects: Secondary | ICD-10-CM | POA: Diagnosis not present

## 2022-11-15 DIAGNOSIS — N186 End stage renal disease: Secondary | ICD-10-CM | POA: Diagnosis not present

## 2022-11-15 DIAGNOSIS — N2581 Secondary hyperparathyroidism of renal origin: Secondary | ICD-10-CM | POA: Diagnosis not present

## 2022-11-17 DIAGNOSIS — N186 End stage renal disease: Secondary | ICD-10-CM | POA: Diagnosis not present

## 2022-11-17 DIAGNOSIS — N2581 Secondary hyperparathyroidism of renal origin: Secondary | ICD-10-CM | POA: Diagnosis not present

## 2022-11-17 DIAGNOSIS — D688 Other specified coagulation defects: Secondary | ICD-10-CM | POA: Diagnosis not present

## 2022-11-17 DIAGNOSIS — Z992 Dependence on renal dialysis: Secondary | ICD-10-CM | POA: Diagnosis not present

## 2022-11-18 DIAGNOSIS — Z992 Dependence on renal dialysis: Secondary | ICD-10-CM | POA: Diagnosis not present

## 2022-11-18 DIAGNOSIS — N186 End stage renal disease: Secondary | ICD-10-CM | POA: Diagnosis not present

## 2022-11-18 DIAGNOSIS — I120 Hypertensive chronic kidney disease with stage 5 chronic kidney disease or end stage renal disease: Secondary | ICD-10-CM | POA: Diagnosis not present

## 2022-11-19 DIAGNOSIS — Z992 Dependence on renal dialysis: Secondary | ICD-10-CM | POA: Diagnosis not present

## 2022-11-19 DIAGNOSIS — N186 End stage renal disease: Secondary | ICD-10-CM | POA: Diagnosis not present

## 2022-11-19 DIAGNOSIS — N2581 Secondary hyperparathyroidism of renal origin: Secondary | ICD-10-CM | POA: Diagnosis not present

## 2022-11-19 DIAGNOSIS — D688 Other specified coagulation defects: Secondary | ICD-10-CM | POA: Diagnosis not present

## 2022-11-22 DIAGNOSIS — Z992 Dependence on renal dialysis: Secondary | ICD-10-CM | POA: Diagnosis not present

## 2022-11-22 DIAGNOSIS — N2581 Secondary hyperparathyroidism of renal origin: Secondary | ICD-10-CM | POA: Diagnosis not present

## 2022-11-22 DIAGNOSIS — N186 End stage renal disease: Secondary | ICD-10-CM | POA: Diagnosis not present

## 2022-11-22 DIAGNOSIS — D688 Other specified coagulation defects: Secondary | ICD-10-CM | POA: Diagnosis not present

## 2022-11-24 DIAGNOSIS — Z992 Dependence on renal dialysis: Secondary | ICD-10-CM | POA: Diagnosis not present

## 2022-11-24 DIAGNOSIS — N186 End stage renal disease: Secondary | ICD-10-CM | POA: Diagnosis not present

## 2022-11-24 DIAGNOSIS — D688 Other specified coagulation defects: Secondary | ICD-10-CM | POA: Diagnosis not present

## 2022-11-24 DIAGNOSIS — N2581 Secondary hyperparathyroidism of renal origin: Secondary | ICD-10-CM | POA: Diagnosis not present

## 2022-11-26 DIAGNOSIS — N2581 Secondary hyperparathyroidism of renal origin: Secondary | ICD-10-CM | POA: Diagnosis not present

## 2022-11-26 DIAGNOSIS — Z992 Dependence on renal dialysis: Secondary | ICD-10-CM | POA: Diagnosis not present

## 2022-11-26 DIAGNOSIS — D688 Other specified coagulation defects: Secondary | ICD-10-CM | POA: Diagnosis not present

## 2022-11-26 DIAGNOSIS — N186 End stage renal disease: Secondary | ICD-10-CM | POA: Diagnosis not present

## 2022-11-29 DIAGNOSIS — N186 End stage renal disease: Secondary | ICD-10-CM | POA: Diagnosis not present

## 2022-11-29 DIAGNOSIS — Z992 Dependence on renal dialysis: Secondary | ICD-10-CM | POA: Diagnosis not present

## 2022-11-29 DIAGNOSIS — D688 Other specified coagulation defects: Secondary | ICD-10-CM | POA: Diagnosis not present

## 2022-11-29 DIAGNOSIS — N2581 Secondary hyperparathyroidism of renal origin: Secondary | ICD-10-CM | POA: Diagnosis not present

## 2022-12-01 DIAGNOSIS — N2581 Secondary hyperparathyroidism of renal origin: Secondary | ICD-10-CM | POA: Diagnosis not present

## 2022-12-01 DIAGNOSIS — Z992 Dependence on renal dialysis: Secondary | ICD-10-CM | POA: Diagnosis not present

## 2022-12-01 DIAGNOSIS — D688 Other specified coagulation defects: Secondary | ICD-10-CM | POA: Diagnosis not present

## 2022-12-01 DIAGNOSIS — N186 End stage renal disease: Secondary | ICD-10-CM | POA: Diagnosis not present

## 2022-12-03 DIAGNOSIS — Z992 Dependence on renal dialysis: Secondary | ICD-10-CM | POA: Diagnosis not present

## 2022-12-03 DIAGNOSIS — N2581 Secondary hyperparathyroidism of renal origin: Secondary | ICD-10-CM | POA: Diagnosis not present

## 2022-12-03 DIAGNOSIS — N186 End stage renal disease: Secondary | ICD-10-CM | POA: Diagnosis not present

## 2022-12-03 DIAGNOSIS — D688 Other specified coagulation defects: Secondary | ICD-10-CM | POA: Diagnosis not present

## 2022-12-06 DIAGNOSIS — D688 Other specified coagulation defects: Secondary | ICD-10-CM | POA: Diagnosis not present

## 2022-12-06 DIAGNOSIS — N2581 Secondary hyperparathyroidism of renal origin: Secondary | ICD-10-CM | POA: Diagnosis not present

## 2022-12-06 DIAGNOSIS — Z992 Dependence on renal dialysis: Secondary | ICD-10-CM | POA: Diagnosis not present

## 2022-12-06 DIAGNOSIS — N186 End stage renal disease: Secondary | ICD-10-CM | POA: Diagnosis not present

## 2022-12-08 DIAGNOSIS — N2581 Secondary hyperparathyroidism of renal origin: Secondary | ICD-10-CM | POA: Diagnosis not present

## 2022-12-08 DIAGNOSIS — Z992 Dependence on renal dialysis: Secondary | ICD-10-CM | POA: Diagnosis not present

## 2022-12-08 DIAGNOSIS — N186 End stage renal disease: Secondary | ICD-10-CM | POA: Diagnosis not present

## 2022-12-08 DIAGNOSIS — D688 Other specified coagulation defects: Secondary | ICD-10-CM | POA: Diagnosis not present

## 2022-12-13 DIAGNOSIS — N186 End stage renal disease: Secondary | ICD-10-CM | POA: Diagnosis not present

## 2022-12-13 DIAGNOSIS — N2581 Secondary hyperparathyroidism of renal origin: Secondary | ICD-10-CM | POA: Diagnosis not present

## 2022-12-13 DIAGNOSIS — D688 Other specified coagulation defects: Secondary | ICD-10-CM | POA: Diagnosis not present

## 2022-12-13 DIAGNOSIS — Z992 Dependence on renal dialysis: Secondary | ICD-10-CM | POA: Diagnosis not present

## 2022-12-15 DIAGNOSIS — N186 End stage renal disease: Secondary | ICD-10-CM | POA: Diagnosis not present

## 2022-12-15 DIAGNOSIS — D688 Other specified coagulation defects: Secondary | ICD-10-CM | POA: Diagnosis not present

## 2022-12-15 DIAGNOSIS — N2581 Secondary hyperparathyroidism of renal origin: Secondary | ICD-10-CM | POA: Diagnosis not present

## 2022-12-15 DIAGNOSIS — Z992 Dependence on renal dialysis: Secondary | ICD-10-CM | POA: Diagnosis not present

## 2022-12-17 DIAGNOSIS — N186 End stage renal disease: Secondary | ICD-10-CM | POA: Diagnosis not present

## 2022-12-17 DIAGNOSIS — D688 Other specified coagulation defects: Secondary | ICD-10-CM | POA: Diagnosis not present

## 2022-12-17 DIAGNOSIS — Z992 Dependence on renal dialysis: Secondary | ICD-10-CM | POA: Diagnosis not present

## 2022-12-17 DIAGNOSIS — N2581 Secondary hyperparathyroidism of renal origin: Secondary | ICD-10-CM | POA: Diagnosis not present

## 2022-12-19 DIAGNOSIS — I120 Hypertensive chronic kidney disease with stage 5 chronic kidney disease or end stage renal disease: Secondary | ICD-10-CM | POA: Diagnosis not present

## 2022-12-19 DIAGNOSIS — Z992 Dependence on renal dialysis: Secondary | ICD-10-CM | POA: Diagnosis not present

## 2022-12-19 DIAGNOSIS — N186 End stage renal disease: Secondary | ICD-10-CM | POA: Diagnosis not present

## 2022-12-20 DIAGNOSIS — N2581 Secondary hyperparathyroidism of renal origin: Secondary | ICD-10-CM | POA: Diagnosis not present

## 2022-12-20 DIAGNOSIS — Z992 Dependence on renal dialysis: Secondary | ICD-10-CM | POA: Diagnosis not present

## 2022-12-20 DIAGNOSIS — N186 End stage renal disease: Secondary | ICD-10-CM | POA: Diagnosis not present

## 2022-12-20 DIAGNOSIS — D688 Other specified coagulation defects: Secondary | ICD-10-CM | POA: Diagnosis not present

## 2022-12-22 DIAGNOSIS — N186 End stage renal disease: Secondary | ICD-10-CM | POA: Diagnosis not present

## 2022-12-22 DIAGNOSIS — Z992 Dependence on renal dialysis: Secondary | ICD-10-CM | POA: Diagnosis not present

## 2022-12-22 DIAGNOSIS — N2581 Secondary hyperparathyroidism of renal origin: Secondary | ICD-10-CM | POA: Diagnosis not present

## 2022-12-22 DIAGNOSIS — D688 Other specified coagulation defects: Secondary | ICD-10-CM | POA: Diagnosis not present

## 2022-12-24 DIAGNOSIS — Z992 Dependence on renal dialysis: Secondary | ICD-10-CM | POA: Diagnosis not present

## 2022-12-24 DIAGNOSIS — N186 End stage renal disease: Secondary | ICD-10-CM | POA: Diagnosis not present

## 2022-12-24 DIAGNOSIS — N2581 Secondary hyperparathyroidism of renal origin: Secondary | ICD-10-CM | POA: Diagnosis not present

## 2022-12-24 DIAGNOSIS — D688 Other specified coagulation defects: Secondary | ICD-10-CM | POA: Diagnosis not present

## 2022-12-27 DIAGNOSIS — D688 Other specified coagulation defects: Secondary | ICD-10-CM | POA: Diagnosis not present

## 2022-12-27 DIAGNOSIS — N2581 Secondary hyperparathyroidism of renal origin: Secondary | ICD-10-CM | POA: Diagnosis not present

## 2022-12-27 DIAGNOSIS — Z992 Dependence on renal dialysis: Secondary | ICD-10-CM | POA: Diagnosis not present

## 2022-12-27 DIAGNOSIS — N186 End stage renal disease: Secondary | ICD-10-CM | POA: Diagnosis not present

## 2022-12-29 DIAGNOSIS — D688 Other specified coagulation defects: Secondary | ICD-10-CM | POA: Diagnosis not present

## 2022-12-29 DIAGNOSIS — N2581 Secondary hyperparathyroidism of renal origin: Secondary | ICD-10-CM | POA: Diagnosis not present

## 2022-12-29 DIAGNOSIS — N186 End stage renal disease: Secondary | ICD-10-CM | POA: Diagnosis not present

## 2022-12-29 DIAGNOSIS — Z992 Dependence on renal dialysis: Secondary | ICD-10-CM | POA: Diagnosis not present

## 2022-12-31 DIAGNOSIS — N2581 Secondary hyperparathyroidism of renal origin: Secondary | ICD-10-CM | POA: Diagnosis not present

## 2022-12-31 DIAGNOSIS — N186 End stage renal disease: Secondary | ICD-10-CM | POA: Diagnosis not present

## 2022-12-31 DIAGNOSIS — D688 Other specified coagulation defects: Secondary | ICD-10-CM | POA: Diagnosis not present

## 2022-12-31 DIAGNOSIS — Z992 Dependence on renal dialysis: Secondary | ICD-10-CM | POA: Diagnosis not present

## 2023-01-03 DIAGNOSIS — N186 End stage renal disease: Secondary | ICD-10-CM | POA: Diagnosis not present

## 2023-01-03 DIAGNOSIS — N2581 Secondary hyperparathyroidism of renal origin: Secondary | ICD-10-CM | POA: Diagnosis not present

## 2023-01-03 DIAGNOSIS — Z992 Dependence on renal dialysis: Secondary | ICD-10-CM | POA: Diagnosis not present

## 2023-01-03 DIAGNOSIS — D688 Other specified coagulation defects: Secondary | ICD-10-CM | POA: Diagnosis not present

## 2023-01-05 DIAGNOSIS — N2581 Secondary hyperparathyroidism of renal origin: Secondary | ICD-10-CM | POA: Diagnosis not present

## 2023-01-05 DIAGNOSIS — Z992 Dependence on renal dialysis: Secondary | ICD-10-CM | POA: Diagnosis not present

## 2023-01-05 DIAGNOSIS — D688 Other specified coagulation defects: Secondary | ICD-10-CM | POA: Diagnosis not present

## 2023-01-05 DIAGNOSIS — N186 End stage renal disease: Secondary | ICD-10-CM | POA: Diagnosis not present

## 2023-01-07 DIAGNOSIS — Z992 Dependence on renal dialysis: Secondary | ICD-10-CM | POA: Diagnosis not present

## 2023-01-07 DIAGNOSIS — N186 End stage renal disease: Secondary | ICD-10-CM | POA: Diagnosis not present

## 2023-01-07 DIAGNOSIS — N2581 Secondary hyperparathyroidism of renal origin: Secondary | ICD-10-CM | POA: Diagnosis not present

## 2023-01-07 DIAGNOSIS — D688 Other specified coagulation defects: Secondary | ICD-10-CM | POA: Diagnosis not present

## 2023-01-10 DIAGNOSIS — N186 End stage renal disease: Secondary | ICD-10-CM | POA: Diagnosis not present

## 2023-01-10 DIAGNOSIS — N2581 Secondary hyperparathyroidism of renal origin: Secondary | ICD-10-CM | POA: Diagnosis not present

## 2023-01-10 DIAGNOSIS — D688 Other specified coagulation defects: Secondary | ICD-10-CM | POA: Diagnosis not present

## 2023-01-10 DIAGNOSIS — Z992 Dependence on renal dialysis: Secondary | ICD-10-CM | POA: Diagnosis not present

## 2023-01-12 DIAGNOSIS — D688 Other specified coagulation defects: Secondary | ICD-10-CM | POA: Diagnosis not present

## 2023-01-12 DIAGNOSIS — N186 End stage renal disease: Secondary | ICD-10-CM | POA: Diagnosis not present

## 2023-01-12 DIAGNOSIS — Z992 Dependence on renal dialysis: Secondary | ICD-10-CM | POA: Diagnosis not present

## 2023-01-12 DIAGNOSIS — N2581 Secondary hyperparathyroidism of renal origin: Secondary | ICD-10-CM | POA: Diagnosis not present

## 2023-01-14 ENCOUNTER — Ambulatory Visit (INDEPENDENT_AMBULATORY_CARE_PROVIDER_SITE_OTHER): Payer: 59 | Admitting: Nurse Practitioner

## 2023-01-14 ENCOUNTER — Encounter: Payer: Self-pay | Admitting: Nurse Practitioner

## 2023-01-14 ENCOUNTER — Telehealth: Payer: Self-pay

## 2023-01-14 VITALS — BP 110/62 | HR 78 | Temp 98.4°F | Resp 16 | Ht 63.5 in | Wt 173.8 lb

## 2023-01-14 DIAGNOSIS — Z992 Dependence on renal dialysis: Secondary | ICD-10-CM | POA: Diagnosis not present

## 2023-01-14 DIAGNOSIS — I953 Hypotension of hemodialysis: Secondary | ICD-10-CM | POA: Diagnosis not present

## 2023-01-14 DIAGNOSIS — D688 Other specified coagulation defects: Secondary | ICD-10-CM | POA: Diagnosis not present

## 2023-01-14 DIAGNOSIS — N2581 Secondary hyperparathyroidism of renal origin: Secondary | ICD-10-CM | POA: Diagnosis not present

## 2023-01-14 DIAGNOSIS — N186 End stage renal disease: Secondary | ICD-10-CM

## 2023-01-14 DIAGNOSIS — I700 Atherosclerosis of aorta: Secondary | ICD-10-CM

## 2023-01-14 NOTE — Patient Instructions (Addendum)
Sign medical release to get lab results from dialysis center. Schedule mammogram for 07/07/2023

## 2023-01-14 NOTE — Assessment & Plan Note (Addendum)
Dialysis schedule MWF via right arm fistula at [facility]

## 2023-01-14 NOTE — Assessment & Plan Note (Signed)
Under the care of nephrology Midodrine prescribed BP Readings from Last 3 Encounters:  01/14/23 110/62  07/15/22 90/60  05/13/22 (!) 95/58

## 2023-01-14 NOTE — Patient Instructions (Signed)
Visit Information  Thank you for taking time to visit with me today. Please don't hesitate to contact me if I can be of assistance to you.   Following are the goals we discussed today:   Goals Addressed             This Visit's Progress    COMPLETED: Care Coordination Activities-No follow up required       Care Coordination Interventions: Advised patient to Annual Wellness exam. Discussed [facility] services and support. Assessed SDOH. Advised to discuss with primary care physician if services needed in the future.           If you are experiencing a Mental Health or Behavioral Health Crisis or need someone to talk to, please call the Suicide and Crisis Lifeline: 988   The patient verbalized understanding of instructions, educational materials, and care plan provided today and DECLINED offer to receive copy of patient instructions, educational materials, and care plan.   The patient has been provided with contact information for the care management team and has been advised to call with any health related questions or concerns.  No further follow up required: decline  Bary Leriche, RN, MSN [facility] Care Management Care Management Coordinator Direct Line [phone]

## 2023-01-14 NOTE — Assessment & Plan Note (Signed)
Declined to continue crestor

## 2023-01-14 NOTE — Progress Notes (Signed)
Established Patient Visit  Patient: Victoria [name]   DOB: 04/28/1956   67 y.o. Female  MRN: [id number] Visit Date: 01/14/2023  Subjective:    Chief Complaint  Patient presents with   Hypertension    Dr. Allena Katz (Kidney doctor) Started her on Midodrine 10 mg and Auryxia 210mg /fe   Hypertension   Hypotension of hemodialysis Under the care of nephrology Midodrine prescribed BP Readings from Last 3 Encounters:  01/14/23 110/62  07/15/22 90/60  05/13/22 (!) 95/58     ESRD on dialysis [facility]) Dialysis schedule MWF via right arm fistula at [facility]  Aortic atherosclerosis (HCC) Declined to continue crestor  Wt Readings from Last 3 Encounters:  01/14/23 173 lb 12.8 oz (78.8 kg)  07/15/22 169 lb (76.7 kg)  05/13/22 169 lb 14.4 oz (77.1 kg)      Reviewed medical, surgical, and social history today  Medications: Outpatient Medications Prior to Visit  Medication Sig   AURYXIA 1 GM 210 MG(Fe) tablet Take by mouth.   midodrine (PROAMATINE) 10 MG tablet Take by mouth.   sevelamer carbonate (RENVELA) 2.4 g PACK Take by mouth.   VITAMIN D PO Take by mouth.   No facility-administered medications prior to visit.   Reviewed past medical and social history.   ROS per HPI above      Objective:  BP 110/62 (BP Location: Left Arm, Patient Position: Sitting, Cuff Size: Large)   Pulse 78   Temp 98.4 F (36.9 C) (Temporal)   Resp 16   Ht 5' 3.5" (1.613 m)   Wt 173 lb 12.8 oz (78.8 kg)   LMP  (LMP Unknown)   SpO2 98%   BMI 30.30 kg/m      Physical Exam Vitals and nursing note reviewed.  Cardiovascular:     Rate and Rhythm: Normal rate and regular rhythm.     Pulses: Normal pulses.     Heart sounds: Normal heart sounds.  Pulmonary:     Effort: Pulmonary effort is normal.     Breath sounds: Normal breath sounds.  Musculoskeletal:     Right lower leg: No edema.     Left lower leg: No edema.  Neurological:     Mental Status: She is alert and  oriented to person, place, and time.  Psychiatric:        Mood and Affect: Mood normal.        Behavior: Behavior normal.        Thought Content: Thought content normal.     No results found for any visits on 01/14/23.    Assessment & Plan:   Requested lab results from Dialysis center.  Problem List Items Addressed This Visit       Cardiovascular and Mediastinum   Aortic atherosclerosis (HCC)    Declined to continue crestor      Relevant Medications   midodrine (PROAMATINE) 10 MG tablet   Hypotension of hemodialysis - Primary    Under the care of nephrology Midodrine prescribed BP Readings from Last 3 Encounters:  01/14/23 110/62  07/15/22 90/60  05/13/22 (!) 95/58         Relevant Medications   midodrine (PROAMATINE) 10 MG tablet     Genitourinary   ESRD on dialysis [facility])    Dialysis schedule MWF via right arm fistula at [facility]      Return in about 1 year (around 01/14/2024) for CPE (fasting).  Wilfred Lacy, NP

## 2023-01-14 NOTE — Patient Outreach (Signed)
  Care Coordination   In Person Provider Office Visit Note   01/14/2023 Name: Victoria [name] MRN: [id number] DOB: 07-01-1956  Victoria [name] is a 67 y.o. year old female who sees Victoria [name], Victoria Gains, NP for primary care. I engaged with Victoria [name] in the providers office today.  What matters to the patients health and wellness today?  none    Goals Addressed             This Visit's Progress    COMPLETED: Care Coordination Activities-No follow up required       Care Coordination Interventions: Advised patient to Annual Wellness exam. Discussed [facility] services and support. Assessed SDOH. Advised to discuss with primary care physician if services needed in the future.          SDOH assessments and interventions completed:  Yes  SDOH Interventions Today    Flowsheet Row Most Recent Value  SDOH Interventions   Housing Interventions Intervention Not Indicated  Transportation Interventions Intervention Not Indicated        Care Coordination Interventions:  Yes, provided   Follow up plan: No further intervention required.   Encounter Outcome:  Pt. Visit Completed   Victoria Leriche, RN, MSN [facility] Care Management Care Management Coordinator Direct Line [phone]

## 2023-01-17 DIAGNOSIS — D688 Other specified coagulation defects: Secondary | ICD-10-CM | POA: Diagnosis not present

## 2023-01-17 DIAGNOSIS — Z992 Dependence on renal dialysis: Secondary | ICD-10-CM | POA: Diagnosis not present

## 2023-01-17 DIAGNOSIS — N2581 Secondary hyperparathyroidism of renal origin: Secondary | ICD-10-CM | POA: Diagnosis not present

## 2023-01-17 DIAGNOSIS — N186 End stage renal disease: Secondary | ICD-10-CM | POA: Diagnosis not present

## 2023-01-18 DIAGNOSIS — I120 Hypertensive chronic kidney disease with stage 5 chronic kidney disease or end stage renal disease: Secondary | ICD-10-CM | POA: Diagnosis not present

## 2023-01-18 DIAGNOSIS — N186 End stage renal disease: Secondary | ICD-10-CM | POA: Diagnosis not present

## 2023-01-18 DIAGNOSIS — Z992 Dependence on renal dialysis: Secondary | ICD-10-CM | POA: Diagnosis not present

## 2023-01-19 DIAGNOSIS — N186 End stage renal disease: Secondary | ICD-10-CM | POA: Diagnosis not present

## 2023-01-19 DIAGNOSIS — Z992 Dependence on renal dialysis: Secondary | ICD-10-CM | POA: Diagnosis not present

## 2023-01-19 DIAGNOSIS — D688 Other specified coagulation defects: Secondary | ICD-10-CM | POA: Diagnosis not present

## 2023-01-19 DIAGNOSIS — N2581 Secondary hyperparathyroidism of renal origin: Secondary | ICD-10-CM | POA: Diagnosis not present

## 2023-01-21 DIAGNOSIS — Z992 Dependence on renal dialysis: Secondary | ICD-10-CM | POA: Diagnosis not present

## 2023-01-21 DIAGNOSIS — N186 End stage renal disease: Secondary | ICD-10-CM | POA: Diagnosis not present

## 2023-01-21 DIAGNOSIS — N2581 Secondary hyperparathyroidism of renal origin: Secondary | ICD-10-CM | POA: Diagnosis not present

## 2023-01-21 DIAGNOSIS — D688 Other specified coagulation defects: Secondary | ICD-10-CM | POA: Diagnosis not present

## 2023-01-24 DIAGNOSIS — N2581 Secondary hyperparathyroidism of renal origin: Secondary | ICD-10-CM | POA: Diagnosis not present

## 2023-01-24 DIAGNOSIS — Z992 Dependence on renal dialysis: Secondary | ICD-10-CM | POA: Diagnosis not present

## 2023-01-24 DIAGNOSIS — N186 End stage renal disease: Secondary | ICD-10-CM | POA: Diagnosis not present

## 2023-01-24 DIAGNOSIS — D688 Other specified coagulation defects: Secondary | ICD-10-CM | POA: Diagnosis not present

## 2023-01-26 DIAGNOSIS — D688 Other specified coagulation defects: Secondary | ICD-10-CM | POA: Diagnosis not present

## 2023-01-26 DIAGNOSIS — N2581 Secondary hyperparathyroidism of renal origin: Secondary | ICD-10-CM | POA: Diagnosis not present

## 2023-01-26 DIAGNOSIS — Z992 Dependence on renal dialysis: Secondary | ICD-10-CM | POA: Diagnosis not present

## 2023-01-26 DIAGNOSIS — N186 End stage renal disease: Secondary | ICD-10-CM | POA: Diagnosis not present

## 2023-01-28 DIAGNOSIS — N2581 Secondary hyperparathyroidism of renal origin: Secondary | ICD-10-CM | POA: Diagnosis not present

## 2023-01-28 DIAGNOSIS — D688 Other specified coagulation defects: Secondary | ICD-10-CM | POA: Diagnosis not present

## 2023-01-28 DIAGNOSIS — N186 End stage renal disease: Secondary | ICD-10-CM | POA: Diagnosis not present

## 2023-01-28 DIAGNOSIS — Z992 Dependence on renal dialysis: Secondary | ICD-10-CM | POA: Diagnosis not present

## 2023-01-31 DIAGNOSIS — N2581 Secondary hyperparathyroidism of renal origin: Secondary | ICD-10-CM | POA: Diagnosis not present

## 2023-01-31 DIAGNOSIS — N186 End stage renal disease: Secondary | ICD-10-CM | POA: Diagnosis not present

## 2023-01-31 DIAGNOSIS — D688 Other specified coagulation defects: Secondary | ICD-10-CM | POA: Diagnosis not present

## 2023-01-31 DIAGNOSIS — Z992 Dependence on renal dialysis: Secondary | ICD-10-CM | POA: Diagnosis not present

## 2023-02-02 DIAGNOSIS — Z992 Dependence on renal dialysis: Secondary | ICD-10-CM | POA: Diagnosis not present

## 2023-02-02 DIAGNOSIS — N186 End stage renal disease: Secondary | ICD-10-CM | POA: Diagnosis not present

## 2023-02-02 DIAGNOSIS — N2581 Secondary hyperparathyroidism of renal origin: Secondary | ICD-10-CM | POA: Diagnosis not present

## 2023-02-02 DIAGNOSIS — D688 Other specified coagulation defects: Secondary | ICD-10-CM | POA: Diagnosis not present

## 2023-02-04 DIAGNOSIS — N186 End stage renal disease: Secondary | ICD-10-CM | POA: Diagnosis not present

## 2023-02-04 DIAGNOSIS — N2581 Secondary hyperparathyroidism of renal origin: Secondary | ICD-10-CM | POA: Diagnosis not present

## 2023-02-04 DIAGNOSIS — Z992 Dependence on renal dialysis: Secondary | ICD-10-CM | POA: Diagnosis not present

## 2023-02-04 DIAGNOSIS — D688 Other specified coagulation defects: Secondary | ICD-10-CM | POA: Diagnosis not present

## 2023-02-07 DIAGNOSIS — N186 End stage renal disease: Secondary | ICD-10-CM | POA: Diagnosis not present

## 2023-02-07 DIAGNOSIS — N2581 Secondary hyperparathyroidism of renal origin: Secondary | ICD-10-CM | POA: Diagnosis not present

## 2023-02-07 DIAGNOSIS — D688 Other specified coagulation defects: Secondary | ICD-10-CM | POA: Diagnosis not present

## 2023-02-07 DIAGNOSIS — Z992 Dependence on renal dialysis: Secondary | ICD-10-CM | POA: Diagnosis not present

## 2023-02-09 DIAGNOSIS — D688 Other specified coagulation defects: Secondary | ICD-10-CM | POA: Diagnosis not present

## 2023-02-09 DIAGNOSIS — N186 End stage renal disease: Secondary | ICD-10-CM | POA: Diagnosis not present

## 2023-02-09 DIAGNOSIS — N2581 Secondary hyperparathyroidism of renal origin: Secondary | ICD-10-CM | POA: Diagnosis not present

## 2023-02-09 DIAGNOSIS — Z992 Dependence on renal dialysis: Secondary | ICD-10-CM | POA: Diagnosis not present

## 2023-02-11 DIAGNOSIS — N186 End stage renal disease: Secondary | ICD-10-CM | POA: Diagnosis not present

## 2023-02-11 DIAGNOSIS — Z992 Dependence on renal dialysis: Secondary | ICD-10-CM | POA: Diagnosis not present

## 2023-02-11 DIAGNOSIS — D688 Other specified coagulation defects: Secondary | ICD-10-CM | POA: Diagnosis not present

## 2023-02-11 DIAGNOSIS — N2581 Secondary hyperparathyroidism of renal origin: Secondary | ICD-10-CM | POA: Diagnosis not present

## 2023-02-14 DIAGNOSIS — N2581 Secondary hyperparathyroidism of renal origin: Secondary | ICD-10-CM | POA: Diagnosis not present

## 2023-02-14 DIAGNOSIS — D688 Other specified coagulation defects: Secondary | ICD-10-CM | POA: Diagnosis not present

## 2023-02-14 DIAGNOSIS — Z992 Dependence on renal dialysis: Secondary | ICD-10-CM | POA: Diagnosis not present

## 2023-02-14 DIAGNOSIS — N186 End stage renal disease: Secondary | ICD-10-CM | POA: Diagnosis not present

## 2023-02-16 DIAGNOSIS — D688 Other specified coagulation defects: Secondary | ICD-10-CM | POA: Diagnosis not present

## 2023-02-16 DIAGNOSIS — N2581 Secondary hyperparathyroidism of renal origin: Secondary | ICD-10-CM | POA: Diagnosis not present

## 2023-02-16 DIAGNOSIS — Z992 Dependence on renal dialysis: Secondary | ICD-10-CM | POA: Diagnosis not present

## 2023-02-16 DIAGNOSIS — N186 End stage renal disease: Secondary | ICD-10-CM | POA: Diagnosis not present

## 2023-02-18 DIAGNOSIS — N186 End stage renal disease: Secondary | ICD-10-CM | POA: Diagnosis not present

## 2023-02-18 DIAGNOSIS — Z992 Dependence on renal dialysis: Secondary | ICD-10-CM | POA: Diagnosis not present

## 2023-02-18 DIAGNOSIS — N2581 Secondary hyperparathyroidism of renal origin: Secondary | ICD-10-CM | POA: Diagnosis not present

## 2023-02-18 DIAGNOSIS — D688 Other specified coagulation defects: Secondary | ICD-10-CM | POA: Diagnosis not present

## 2023-02-18 DIAGNOSIS — I120 Hypertensive chronic kidney disease with stage 5 chronic kidney disease or end stage renal disease: Secondary | ICD-10-CM | POA: Diagnosis not present

## 2023-02-21 DIAGNOSIS — T8249XA Other complication of vascular dialysis catheter, initial encounter: Secondary | ICD-10-CM | POA: Diagnosis not present

## 2023-02-21 DIAGNOSIS — N2581 Secondary hyperparathyroidism of renal origin: Secondary | ICD-10-CM | POA: Diagnosis not present

## 2023-02-21 DIAGNOSIS — D688 Other specified coagulation defects: Secondary | ICD-10-CM | POA: Diagnosis not present

## 2023-02-21 DIAGNOSIS — D689 Coagulation defect, unspecified: Secondary | ICD-10-CM | POA: Diagnosis not present

## 2023-02-21 DIAGNOSIS — Z992 Dependence on renal dialysis: Secondary | ICD-10-CM | POA: Diagnosis not present

## 2023-02-21 DIAGNOSIS — N186 End stage renal disease: Secondary | ICD-10-CM | POA: Diagnosis not present

## 2023-02-23 DIAGNOSIS — Z992 Dependence on renal dialysis: Secondary | ICD-10-CM | POA: Diagnosis not present

## 2023-02-23 DIAGNOSIS — D689 Coagulation defect, unspecified: Secondary | ICD-10-CM | POA: Diagnosis not present

## 2023-02-23 DIAGNOSIS — N186 End stage renal disease: Secondary | ICD-10-CM | POA: Diagnosis not present

## 2023-02-23 DIAGNOSIS — T8249XA Other complication of vascular dialysis catheter, initial encounter: Secondary | ICD-10-CM | POA: Diagnosis not present

## 2023-02-23 DIAGNOSIS — D688 Other specified coagulation defects: Secondary | ICD-10-CM | POA: Diagnosis not present

## 2023-02-23 DIAGNOSIS — N2581 Secondary hyperparathyroidism of renal origin: Secondary | ICD-10-CM | POA: Diagnosis not present

## 2023-02-25 DIAGNOSIS — N186 End stage renal disease: Secondary | ICD-10-CM | POA: Diagnosis not present

## 2023-02-25 DIAGNOSIS — D688 Other specified coagulation defects: Secondary | ICD-10-CM | POA: Diagnosis not present

## 2023-02-25 DIAGNOSIS — T8249XA Other complication of vascular dialysis catheter, initial encounter: Secondary | ICD-10-CM | POA: Diagnosis not present

## 2023-02-25 DIAGNOSIS — Z992 Dependence on renal dialysis: Secondary | ICD-10-CM | POA: Diagnosis not present

## 2023-02-25 DIAGNOSIS — D689 Coagulation defect, unspecified: Secondary | ICD-10-CM | POA: Diagnosis not present

## 2023-02-25 DIAGNOSIS — N2581 Secondary hyperparathyroidism of renal origin: Secondary | ICD-10-CM | POA: Diagnosis not present

## 2023-03-01 DIAGNOSIS — T82858A Stenosis of vascular prosthetic devices, implants and grafts, initial encounter: Secondary | ICD-10-CM | POA: Diagnosis not present

## 2023-03-01 DIAGNOSIS — T82868A Thrombosis of vascular prosthetic devices, implants and grafts, initial encounter: Secondary | ICD-10-CM | POA: Diagnosis not present

## 2023-03-01 DIAGNOSIS — D689 Coagulation defect, unspecified: Secondary | ICD-10-CM | POA: Diagnosis not present

## 2023-03-01 DIAGNOSIS — I871 Compression of vein: Secondary | ICD-10-CM | POA: Diagnosis not present

## 2023-03-01 DIAGNOSIS — N186 End stage renal disease: Secondary | ICD-10-CM | POA: Diagnosis not present

## 2023-03-01 DIAGNOSIS — T8249XA Other complication of vascular dialysis catheter, initial encounter: Secondary | ICD-10-CM | POA: Diagnosis not present

## 2023-03-01 DIAGNOSIS — D688 Other specified coagulation defects: Secondary | ICD-10-CM | POA: Diagnosis not present

## 2023-03-01 DIAGNOSIS — N2581 Secondary hyperparathyroidism of renal origin: Secondary | ICD-10-CM | POA: Diagnosis not present

## 2023-03-01 DIAGNOSIS — Z992 Dependence on renal dialysis: Secondary | ICD-10-CM | POA: Diagnosis not present

## 2023-03-02 DIAGNOSIS — N186 End stage renal disease: Secondary | ICD-10-CM | POA: Diagnosis not present

## 2023-03-02 DIAGNOSIS — N2581 Secondary hyperparathyroidism of renal origin: Secondary | ICD-10-CM | POA: Diagnosis not present

## 2023-03-02 DIAGNOSIS — T8249XA Other complication of vascular dialysis catheter, initial encounter: Secondary | ICD-10-CM | POA: Diagnosis not present

## 2023-03-02 DIAGNOSIS — D689 Coagulation defect, unspecified: Secondary | ICD-10-CM | POA: Diagnosis not present

## 2023-03-02 DIAGNOSIS — Z992 Dependence on renal dialysis: Secondary | ICD-10-CM | POA: Diagnosis not present

## 2023-03-02 DIAGNOSIS — D688 Other specified coagulation defects: Secondary | ICD-10-CM | POA: Diagnosis not present

## 2023-03-04 DIAGNOSIS — N2581 Secondary hyperparathyroidism of renal origin: Secondary | ICD-10-CM | POA: Diagnosis not present

## 2023-03-04 DIAGNOSIS — D689 Coagulation defect, unspecified: Secondary | ICD-10-CM | POA: Diagnosis not present

## 2023-03-04 DIAGNOSIS — Z992 Dependence on renal dialysis: Secondary | ICD-10-CM | POA: Diagnosis not present

## 2023-03-04 DIAGNOSIS — D688 Other specified coagulation defects: Secondary | ICD-10-CM | POA: Diagnosis not present

## 2023-03-04 DIAGNOSIS — T8249XA Other complication of vascular dialysis catheter, initial encounter: Secondary | ICD-10-CM | POA: Diagnosis not present

## 2023-03-04 DIAGNOSIS — N186 End stage renal disease: Secondary | ICD-10-CM | POA: Diagnosis not present

## 2023-03-07 ENCOUNTER — Inpatient Hospital Stay (HOSPITAL_COMMUNITY): Payer: 59 | Admitting: Anesthesiology

## 2023-03-07 ENCOUNTER — Encounter (HOSPITAL_COMMUNITY)
Admission: EM | Disposition: A | Discharge status: Home / Self Care | Payer: Self-pay | Source: Home / Self Care | Attending: Emergency Medicine

## 2023-03-07 ENCOUNTER — Inpatient Hospital Stay (HOSPITAL_COMMUNITY): Payer: 59

## 2023-03-07 ENCOUNTER — Telehealth: Payer: Self-pay | Admitting: Vascular Surgery

## 2023-03-07 ENCOUNTER — Observation Stay (HOSPITAL_COMMUNITY)
Admission: EM | Admit: 2023-03-07 | Discharge: 2023-03-08 | Disposition: A | Discharge status: Home / Self Care | Payer: 59 | Attending: Internal Medicine | Admitting: Internal Medicine

## 2023-03-07 ENCOUNTER — Other Ambulatory Visit: Payer: Self-pay

## 2023-03-07 ENCOUNTER — Encounter (HOSPITAL_COMMUNITY): Payer: Self-pay

## 2023-03-07 DIAGNOSIS — T82838A Hemorrhage of vascular prosthetic devices, implants and grafts, initial encounter: Secondary | ICD-10-CM | POA: Diagnosis not present

## 2023-03-07 DIAGNOSIS — Z992 Dependence on renal dialysis: Secondary | ICD-10-CM | POA: Diagnosis not present

## 2023-03-07 DIAGNOSIS — E211 Secondary hyperparathyroidism, not elsewhere classified: Secondary | ICD-10-CM | POA: Insufficient documentation

## 2023-03-07 DIAGNOSIS — I120 Hypertensive chronic kidney disease with stage 5 chronic kidney disease or end stage renal disease: Secondary | ICD-10-CM | POA: Diagnosis not present

## 2023-03-07 DIAGNOSIS — E6609 Other obesity due to excess calories: Secondary | ICD-10-CM

## 2023-03-07 DIAGNOSIS — T8249XA Other complication of vascular dialysis catheter, initial encounter: Secondary | ICD-10-CM | POA: Diagnosis not present

## 2023-03-07 DIAGNOSIS — D631 Anemia in chronic kidney disease: Secondary | ICD-10-CM | POA: Diagnosis not present

## 2023-03-07 DIAGNOSIS — D649 Anemia, unspecified: Secondary | ICD-10-CM | POA: Insufficient documentation

## 2023-03-07 DIAGNOSIS — Y792 Prosthetic and other implants, materials and accessory orthopedic devices associated with adverse incidents: Secondary | ICD-10-CM | POA: Insufficient documentation

## 2023-03-07 DIAGNOSIS — R6889 Other general symptoms and signs: Secondary | ICD-10-CM | POA: Diagnosis not present

## 2023-03-07 DIAGNOSIS — N186 End stage renal disease: Secondary | ICD-10-CM | POA: Diagnosis not present

## 2023-03-07 DIAGNOSIS — Z9089 Acquired absence of other organs: Secondary | ICD-10-CM | POA: Insufficient documentation

## 2023-03-07 DIAGNOSIS — Z9889 Other specified postprocedural states: Secondary | ICD-10-CM

## 2023-03-07 DIAGNOSIS — D72829 Elevated white blood cell count, unspecified: Secondary | ICD-10-CM | POA: Diagnosis not present

## 2023-03-07 DIAGNOSIS — T82898A Other specified complication of vascular prosthetic devices, implants and grafts, initial encounter: Secondary | ICD-10-CM | POA: Diagnosis not present

## 2023-03-07 DIAGNOSIS — I959 Hypotension, unspecified: Secondary | ICD-10-CM | POA: Diagnosis not present

## 2023-03-07 DIAGNOSIS — Z79899 Other long term (current) drug therapy: Secondary | ICD-10-CM | POA: Diagnosis not present

## 2023-03-07 DIAGNOSIS — T829XXA Unspecified complication of cardiac and vascular prosthetic device, implant and graft, initial encounter: Principal | ICD-10-CM

## 2023-03-07 DIAGNOSIS — Z4682 Encounter for fitting and adjustment of non-vascular catheter: Secondary | ICD-10-CM | POA: Diagnosis not present

## 2023-03-07 DIAGNOSIS — Z743 Need for continuous supervision: Secondary | ICD-10-CM | POA: Diagnosis not present

## 2023-03-07 DIAGNOSIS — N250 Renal osteodystrophy: Secondary | ICD-10-CM | POA: Diagnosis not present

## 2023-03-07 HISTORY — PX: REVISON OF ARTERIOVENOUS FISTULA: SHX6074

## 2023-03-07 HISTORY — PX: INSERTION OF DIALYSIS CATHETER: SHX1324

## 2023-03-07 LAB — POCT I-STAT, CHEM 8
BUN: 56 mg/dL — ABNORMAL HIGH (ref 8–23)
Calcium, Ion: 1.19 mmol/L (ref 1.15–1.40)
Chloride: 104 mmol/L (ref 98–111)
Creatinine, Ser: 13.2 mg/dL — ABNORMAL HIGH (ref 0.44–1.00)
Glucose, Bld: 108 mg/dL — ABNORMAL HIGH (ref 70–99)
HCT: 36 % (ref 36.0–46.0)
Hemoglobin: 12.2 g/dL (ref 12.0–15.0)
Potassium: 4.5 mmol/L (ref 3.5–5.1)
Sodium: 139 mmol/L (ref 135–145)
TCO2: 31 mmol/L (ref 22–32)

## 2023-03-07 LAB — CBC
HCT: 40.8 % (ref 36.0–46.0)
Hemoglobin: 13.4 g/dL (ref 12.0–15.0)
MCH: 27.1 pg (ref 26.0–34.0)
MCHC: 32.8 g/dL (ref 30.0–36.0)
MCV: 82.6 fL (ref 80.0–100.0)
Platelets: 215 10*3/uL (ref 150–400)
RBC: 4.94 MIL/uL (ref 3.87–5.11)
RDW: 14.9 % (ref 11.5–15.5)
WBC: 11.5 10*3/uL — ABNORMAL HIGH (ref 4.0–10.5)
nRBC: 0 % (ref 0.0–0.2)

## 2023-03-07 LAB — BASIC METABOLIC PANEL
Anion gap: 16 — ABNORMAL HIGH (ref 5–15)
BUN: 66 mg/dL — ABNORMAL HIGH (ref 8–23)
CO2: 24 mmol/L (ref 22–32)
Calcium: 10.1 mg/dL (ref 8.9–10.3)
Chloride: 97 mmol/L — ABNORMAL LOW (ref 98–111)
Creatinine, Ser: 12.97 mg/dL — ABNORMAL HIGH (ref 0.44–1.00)
GFR, Estimated: 3 mL/min — ABNORMAL LOW (ref >60)
Glucose, Bld: 86 mg/dL (ref 70–99)
Potassium: 4.5 mmol/L (ref 3.5–5.1)
Sodium: 137 mmol/L (ref 135–145)

## 2023-03-07 LAB — APTT: aPTT: 37 seconds — ABNORMAL HIGH (ref 24–36)

## 2023-03-07 LAB — HEPATITIS B SURFACE ANTIGEN: Hepatitis B Surface Ag: NONREACTIVE

## 2023-03-07 LAB — PROTIME-INR
INR: 1.1 (ref 0.8–1.2)
Prothrombin Time: 14.3 seconds (ref 11.4–15.2)

## 2023-03-07 LAB — MRSA NEXT GEN BY PCR, NASAL: MRSA by PCR Next Gen: NOT DETECTED

## 2023-03-07 SURGERY — REVISON OF ARTERIOVENOUS FISTULA
Anesthesia: General | Site: Groin | Laterality: Right

## 2023-03-07 MED ORDER — FENTANYL CITRATE (PF) 250 MCG/5ML IJ SOLN
INTRAMUSCULAR | Status: DC | PRN
  Administered 2023-03-07: 25 ug via INTRAVENOUS
  Administered 2023-03-07: 100 ug via INTRAVENOUS
  Administered 2023-03-07: 25 ug via INTRAVENOUS

## 2023-03-07 MED ORDER — ORAL CARE MOUTH RINSE: 15.0000 mL | Freq: Once | OROMUCOSAL | Status: AC

## 2023-03-07 MED ORDER — CHLORHEXIDINE GLUCONATE 0.12 % MT SOLN: 15.0000 mL | Freq: Once | OROMUCOSAL | Status: AC

## 2023-03-07 MED ORDER — EPHEDRINE SULFATE-NACL 50-0.9 MG/10ML-% IV SOSY
PREFILLED_SYRINGE | INTRAVENOUS | Status: DC | PRN
  Administered 2023-03-07 (×2): 5 mg via INTRAVENOUS
  Administered 2023-03-07: 10 mg via INTRAVENOUS

## 2023-03-07 MED ORDER — CHLORHEXIDINE GLUCONATE 0.12 % MT SOLN
OROMUCOSAL | Status: AC
  Administered 2023-03-07: 15 mL via OROMUCOSAL
  Filled 2023-03-07: qty 15

## 2023-03-07 MED ORDER — ALBUMIN HUMAN 5 % IV SOLN: INTRAVENOUS | Status: DC | PRN

## 2023-03-07 MED ORDER — HYDROMORPHONE HCL 1 MG/ML IJ SOLN: 0.5000 mg | INTRAMUSCULAR | Status: DC | PRN

## 2023-03-07 MED ORDER — HEPARIN 6000 UNIT IRRIGATION SOLUTION
Status: DC | PRN
  Administered 2023-03-07: 1

## 2023-03-07 MED ORDER — CHLORHEXIDINE GLUCONATE CLOTH 2 % EX PADS
6.0000 | MEDICATED_PAD | Freq: Every day | CUTANEOUS | Status: DC
  Administered 2023-03-08: 6 via TOPICAL

## 2023-03-07 MED ORDER — HEPARIN 6000 UNIT IRRIGATION SOLUTION
Status: AC
  Filled 2023-03-07: qty 500

## 2023-03-07 MED ORDER — IODIXANOL 320 MG/ML IV SOLN
INTRAVENOUS | Status: DC | PRN
  Administered 2023-03-07: 10 mL

## 2023-03-07 MED ORDER — ACETAMINOPHEN 650 MG RE SUPP: 650.0000 mg | Freq: Four times a day (QID) | RECTAL | Status: DC | PRN

## 2023-03-07 MED ORDER — FENTANYL CITRATE (PF) 100 MCG/2ML IJ SOLN
25.0000 ug | INTRAMUSCULAR | Status: DC | PRN
  Administered 2023-03-07: 25 ug via INTRAVENOUS

## 2023-03-07 MED ORDER — VANCOMYCIN HCL IN DEXTROSE 1-5 GM/200ML-% IV SOLN
1000.0000 mg | INTRAVENOUS | Status: AC
  Administered 2023-03-07: 1000 mg via INTRAVENOUS
  Filled 2023-03-07: qty 200

## 2023-03-07 MED ORDER — CHLORHEXIDINE GLUCONATE 0.12% ORAL RINSE (MEDLINE KIT)
15.0000 mL | Freq: Two times a day (BID) | OROMUCOSAL | Status: DC
  Administered 2023-03-07 – 2023-03-08 (×2): 15 mL via OROMUCOSAL
  Filled 2023-03-07 (×3): qty 15

## 2023-03-07 MED ORDER — ONDANSETRON HCL 4 MG/2ML IJ SOLN
INTRAMUSCULAR | Status: AC
  Filled 2023-03-07: qty 2

## 2023-03-07 MED ORDER — PHENYLEPHRINE HCL-NACL 20-0.9 MG/250ML-% IV SOLN
INTRAVENOUS | Status: DC | PRN
  Administered 2023-03-07: 30 ug/min via INTRAVENOUS

## 2023-03-07 MED ORDER — ALBUTEROL SULFATE (2.5 MG/3ML) 0.083% IN NEBU: 2.5000 mg | INHALATION_SOLUTION | Freq: Four times a day (QID) | RESPIRATORY_TRACT | Status: DC | PRN

## 2023-03-07 MED ORDER — EPHEDRINE 5 MG/ML INJ
INTRAVENOUS | Status: AC
  Filled 2023-03-07: qty 5

## 2023-03-07 MED ORDER — PROPOFOL 10 MG/ML IV BOLUS
INTRAVENOUS | Status: DC | PRN
  Administered 2023-03-07: 40 mg via INTRAVENOUS
  Administered 2023-03-07: 100 mg via INTRAVENOUS

## 2023-03-07 MED ORDER — SODIUM FLUORIDE 1.1 % DT CREA: 1.0000 | TOPICAL_CREAM | Freq: Every day | DENTAL | Status: DC

## 2023-03-07 MED ORDER — FENTANYL CITRATE (PF) 250 MCG/5ML IJ SOLN
INTRAMUSCULAR | Status: AC
  Filled 2023-03-07: qty 5

## 2023-03-07 MED ORDER — PROPOFOL 10 MG/ML IV BOLUS
INTRAVENOUS | Status: AC
  Filled 2023-03-07: qty 20

## 2023-03-07 MED ORDER — PHENYLEPHRINE 80 MCG/ML (10ML) SYRINGE FOR IV PUSH (FOR BLOOD PRESSURE SUPPORT)
PREFILLED_SYRINGE | INTRAVENOUS | Status: AC
  Filled 2023-03-07: qty 10

## 2023-03-07 MED ORDER — 0.9 % SODIUM CHLORIDE (POUR BTL) OPTIME
TOPICAL | Status: DC | PRN
  Administered 2023-03-07: 1000 mL

## 2023-03-07 MED ORDER — HEPARIN SODIUM (PORCINE) 1000 UNIT/ML IJ SOLN
INTRAMUSCULAR | Status: DC | PRN
  Administered 2023-03-07: 6200 [IU]

## 2023-03-07 MED ORDER — FERRIC CITRATE 1 GM 210 MG(FE) PO TABS
210.0000 mg | ORAL_TABLET | Freq: Three times a day (TID) | ORAL | Status: DC
  Administered 2023-03-07 – 2023-03-08 (×2): 210 mg via ORAL
  Filled 2023-03-07 (×2): qty 1

## 2023-03-07 MED ORDER — ONDANSETRON HCL 4 MG/2ML IJ SOLN
INTRAMUSCULAR | Status: DC | PRN
  Administered 2023-03-07: 4 mg via INTRAVENOUS

## 2023-03-07 MED ORDER — FENTANYL CITRATE (PF) 100 MCG/2ML IJ SOLN
INTRAMUSCULAR | Status: AC
  Filled 2023-03-07: qty 2

## 2023-03-07 MED ORDER — SUCCINYLCHOLINE CHLORIDE 200 MG/10ML IV SOSY
PREFILLED_SYRINGE | INTRAVENOUS | Status: DC | PRN
  Administered 2023-03-07: 100 mg via INTRAVENOUS

## 2023-03-07 MED ORDER — LIDOCAINE 2% (20 MG/ML) 5 ML SYRINGE
INTRAMUSCULAR | Status: DC | PRN
  Administered 2023-03-07: 100 mg via INTRAVENOUS

## 2023-03-07 MED ORDER — SEVELAMER CARBONATE 2.4 G PO PACK
4.8000 g | PACK | Freq: Three times a day (TID) | ORAL | Status: DC
  Administered 2023-03-07: 4.8 g via ORAL
  Filled 2023-03-07 (×4): qty 2

## 2023-03-07 MED ORDER — ACETAMINOPHEN 325 MG PO TABS: 650.0000 mg | ORAL_TABLET | Freq: Four times a day (QID) | ORAL | Status: DC | PRN

## 2023-03-07 MED ORDER — MIDODRINE HCL 5 MG PO TABS
10.0000 mg | ORAL_TABLET | ORAL | Status: DC
  Administered 2023-03-08: 10 mg via ORAL
  Filled 2023-03-07 (×2): qty 2

## 2023-03-07 MED ORDER — PHENYLEPHRINE 80 MCG/ML (10ML) SYRINGE FOR IV PUSH (FOR BLOOD PRESSURE SUPPORT)
PREFILLED_SYRINGE | INTRAVENOUS | Status: DC | PRN
  Administered 2023-03-07: 80 ug via INTRAVENOUS
  Administered 2023-03-07: 160 ug via INTRAVENOUS
  Administered 2023-03-07 (×3): 80 ug via INTRAVENOUS

## 2023-03-07 MED ORDER — SODIUM CHLORIDE 0.9 % IV SOLN: INTRAVENOUS | Status: DC

## 2023-03-07 MED ORDER — HEPARIN SODIUM (PORCINE) 1000 UNIT/ML IJ SOLN
INTRAMUSCULAR | Status: AC
  Filled 2023-03-07: qty 10

## 2023-03-07 MED ORDER — LIDOCAINE 2% (20 MG/ML) 5 ML SYRINGE
INTRAMUSCULAR | Status: AC
  Filled 2023-03-07: qty 5

## 2023-03-07 MED ORDER — OXYCODONE-ACETAMINOPHEN 5-325 MG PO TABS
1.0000 | ORAL_TABLET | ORAL | Status: DC | PRN
  Administered 2023-03-07: 2 via ORAL
  Filled 2023-03-07: qty 2

## 2023-03-07 MED ORDER — LIDOCAINE HCL 1 % IJ SOLN
INTRAMUSCULAR | Status: AC
  Filled 2023-03-07: qty 20

## 2023-03-07 MED ORDER — SODIUM CHLORIDE 0.9% FLUSH
3.0000 mL | Freq: Two times a day (BID) | INTRAVENOUS | Status: DC
  Administered 2023-03-07: 3 mL via INTRAVENOUS

## 2023-03-07 SURGICAL SUPPLY — 66 items
ADH SKN CLS APL DERMABOND .7 (GAUZE/BANDAGES/DRESSINGS) ×2
ARMBAND PINK RESTRICT EXTREMIT (MISCELLANEOUS) ×3 IMPLANT
BAG COUNTER SPONGE SURGICOUNT (BAG) ×3 IMPLANT
BAG DECANTER FOR FLEXI CONT (MISCELLANEOUS) ×3 IMPLANT
BAG SPNG CNTER NS LX DISP (BAG) ×2
BIOPATCH RED 1 DISK 7.0 (GAUZE/BANDAGES/DRESSINGS) ×3 IMPLANT
BNDG CMPR 5X4 KNIT ELC UNQ LF (GAUZE/BANDAGES/DRESSINGS) ×2
BNDG COHESIVE 6X5 TAN NS LF (GAUZE/BANDAGES/DRESSINGS) IMPLANT
BNDG ELASTIC 4INX 5YD STR LF (GAUZE/BANDAGES/DRESSINGS) IMPLANT
BNDG GAUZE DERMACEA FLUFF 4 (GAUZE/BANDAGES/DRESSINGS) IMPLANT
BNDG GZE DERMACEA 4 6PLY (GAUZE/BANDAGES/DRESSINGS) ×2
CANISTER SUCT 3000ML PPV (MISCELLANEOUS) ×3 IMPLANT
CATH PALINDROME-P 19CM W/VT (CATHETERS) IMPLANT
CATH PALINDROME-P 23CM W/VT (CATHETERS) IMPLANT
CATH PALINDROME-P 28CM W/VT (CATHETERS) IMPLANT
CATH STRAIGHT 5FR 65CM (CATHETERS) IMPLANT
CLIP TI MEDIUM 6 (CLIP) ×3 IMPLANT
CLIP TI WIDE RED SMALL 6 (CLIP) ×3 IMPLANT
COVER PROBE W GEL 5X96 (DRAPES) ×3 IMPLANT
COVER SURGICAL LIGHT HANDLE (MISCELLANEOUS) ×3 IMPLANT
DERMABOND ADVANCED .7 DNX12 (GAUZE/BANDAGES/DRESSINGS) ×3 IMPLANT
DRAPE C-ARM 42X72 X-RAY (DRAPES) ×3 IMPLANT
DRAPE CHEST BREAST 15X10 FENES (DRAPES) ×3 IMPLANT
DRAPE ORTHO SPLIT 77X108 STRL (DRAPES) ×4
DRAPE SURG ORHT 6 SPLT 77X108 (DRAPES) IMPLANT
ELECT REM PT RETURN 9FT ADLT (ELECTROSURGICAL) ×2
ELECTRODE REM PT RTRN 9FT ADLT (ELECTROSURGICAL) ×3 IMPLANT
GAUZE PAD ABD 8X10 STRL (GAUZE/BANDAGES/DRESSINGS) IMPLANT
GAUZE SPONGE 4X4 12PLY STRL (GAUZE/BANDAGES/DRESSINGS) IMPLANT
GAUZE XEROFORM 1X8 LF (GAUZE/BANDAGES/DRESSINGS) IMPLANT
GLOVE BIO SURGEON STRL SZ7.5 (GLOVE) ×3 IMPLANT
GLOVE BIOGEL PI IND STRL 8 (GLOVE) ×3 IMPLANT
GOWN STRL REUS W/ TWL LRG LVL3 (GOWN DISPOSABLE) ×6 IMPLANT
GOWN STRL REUS W/ TWL XL LVL3 (GOWN DISPOSABLE) ×6 IMPLANT
GOWN STRL REUS W/TWL LRG LVL3 (GOWN DISPOSABLE) ×4
GOWN STRL REUS W/TWL XL LVL3 (GOWN DISPOSABLE) ×4
GRAFT GORETEX STRETCH 6X40 (Vascular Products) IMPLANT
GUIDEWIRE ANGLED .035X150CM (WIRE) IMPLANT
KIT BASIN OR (CUSTOM PROCEDURE TRAY) ×3 IMPLANT
KIT PALINDROME-P 55CM (CATHETERS) IMPLANT
KIT TURNOVER KIT B (KITS) ×3 IMPLANT
MICROPUNCTURE 5FR ~~LOC~~-NT-U-SST (SHEATH) ×2
NDL 18GX1X1/2 (RX/OR ONLY) (NEEDLE) ×3 IMPLANT
NDL HYPO 25GX1X1/2 BEV (NEEDLE) ×3 IMPLANT
NEEDLE 18GX1X1/2 (RX/OR ONLY) (NEEDLE) ×2 IMPLANT
NEEDLE HYPO 25GX1X1/2 BEV (NEEDLE) ×2 IMPLANT
NS IRRIG 1000ML POUR BTL (IV SOLUTION) ×3 IMPLANT
PACK CV ACCESS (CUSTOM PROCEDURE TRAY) ×3 IMPLANT
PAD ARMBOARD 7.5X6 YLW CONV (MISCELLANEOUS) ×6 IMPLANT
SET MICROPNCTR 5FR ~~LOC~~-NT-U-SST (SHEATH) IMPLANT
SPIKE FLUID TRANSFER (MISCELLANEOUS) ×3 IMPLANT
STAPLER VISISTAT 35W (STAPLE) IMPLANT
SUT ETHILON 3 0 PS 1 (SUTURE) ×3 IMPLANT
SUT MNCRL AB 4-0 PS2 18 (SUTURE) ×3 IMPLANT
SUT PROLENE 5 0 C 1 24 (SUTURE) IMPLANT
SUT PROLENE 6 0 BV (SUTURE) IMPLANT
SUT VIC AB 3-0 SH 27 (SUTURE) ×2
SUT VIC AB 3-0 SH 27X BRD (SUTURE) ×3 IMPLANT
SYR 10ML LL (SYRINGE) ×3 IMPLANT
SYR 20ML LL LF (SYRINGE) ×6 IMPLANT
SYR 5ML LL (SYRINGE) ×3 IMPLANT
SYR CONTROL 10ML LL (SYRINGE) ×3 IMPLANT
TOWEL GREEN STERILE (TOWEL DISPOSABLE) ×3 IMPLANT
TOWEL GREEN STERILE FF (TOWEL DISPOSABLE) ×3 IMPLANT
UNDERPAD 30X36 HEAVY ABSORB (UNDERPADS AND DIAPERS) ×3 IMPLANT
WATER STERILE IRR 1000ML POUR (IV SOLUTION) ×3 IMPLANT

## 2023-03-07 NOTE — Telephone Encounter (Signed)
-----   Message from Emilie Rutter, PA-C sent at 03/07/2023 12:07 PM EDT -----  2-3 week incision check with PA.  PO R arm AVG revision

## 2023-03-07 NOTE — Anesthesia Postprocedure Evaluation (Signed)
Anesthesia Post Note  Patient: Victoria [name]  Procedure(s) Performed: REVISON OF ARTERIOVENOUS FISTULA ARM (Right) UNLTRASOUND GUIDED INSERTION OF TUNNELED DIALYSIS CATHETER (Right: Groin)     Patient location during evaluation: PACU Anesthesia Type: General Level of consciousness: sedated Pain management: pain level controlled Vital Signs Assessment: post-procedure vital signs reviewed and stable Respiratory status: spontaneous breathing and respiratory function stable Cardiovascular status: stable Postop Assessment: no apparent nausea or vomiting Anesthetic complications: no  No notable events documented.  Last Vitals:  Vitals:   03/07/23 1415 03/07/23 1431  BP: 96/65 103/74  Pulse: 66 73  Resp: 12 16  Temp: 36.5 C 36.7 C  SpO2: 97% 96%    Last Pain:  Vitals:   03/07/23 1400  TempSrc:   PainSc: 0-No pain                 Keiton Cosma DANIEL

## 2023-03-07 NOTE — Care Management Obs Status (Signed)
MEDICARE OBSERVATION STATUS NOTIFICATION   Patient Details  Name: Victoria [name] MRN: [id number] Date of Birth: 10/17/1955   Medicare Observation Status Notification Given:  Yes    Tom-Johnson, Hershal Coria, RN 03/07/2023, 3:35 PM

## 2023-03-07 NOTE — ED Provider Notes (Signed)
[place] EMERGENCY DEPARTMENT AT [facility] Provider Note   CSN: [id number] Arrival date & time: 03/07/23  4098     History  Chief Complaint  Patient presents with   Vascular Access Problem    Victoria [name] is a 67 y.o. female.  Patient presents to the emergency department complaining of bleeding from her dialysis vascular access site.  The patient's dialysis site is in the right upper arm.  She states that she is Monday Wednesday Friday dialysis and began to bleed after her dialysis session on Friday.  She states that over the weekend it has had frequent episodes of bleeding including "spurting" blood.  She states that this morning she woke up and her feet was soaked that her arm was resting against it.  She went to dialysis this morning who recommended she come to the emergency department for further evaluation.  She states that she was told she should have, Friday but tried to wait for the weekend.  She denies shortness of breath, weakness, chest pain.  Bleeding was controlled prior to arrival with a clamp.  Access site was bandaged and after the clamp was removed there was no blood seeping through the bandage.  Past medical history otherwise significant for hypertension, history of pulmonary embolism, chronic bronchitis  HPI     Home Medications Prior to Admission medications   Medication Sig Start Date End Date Taking? Authorizing Provider  AURYXIA 1 GM 210 MG(Fe) tablet Take by mouth. 12/10/22   [provider]  chlorhexidine (PERIDEX) 0.12 % solution Use as directed 15 mLs in the mouth or throat 2 (two) times daily. 02/12/23   [provider]  midodrine (PROAMATINE) 10 MG tablet Take by mouth. 12/08/22   [provider]  sevelamer carbonate (RENVELA) 2.4 g PACK Take by mouth. 04/07/22   [provider]  SODIUM FLUORIDE 5000 PLUS 1.1 % CREA dental cream Place 1 Application onto teeth daily. 02/12/23   [provider]  VITAMIN D  PO Take by mouth. 04/14/22 04/13/23  [provider]      Allergies    Amlodipine, Cefazolin, Cephalosporins, and Lisinopril    Review of Systems   Review of Systems  Physical Exam Updated Vital Signs BP 119/63   Pulse 71   Temp 97.7 F (36.5 C) (Oral)   Resp 18   Ht 5\' 3"  (1.6 m)   Wt 77.1 kg   LMP  (LMP Unknown)   SpO2 94%   BMI 30.11 kg/m  Physical Exam Vitals and nursing note reviewed.  Constitutional:      General: She is not in acute distress.    Appearance: She is well-developed.  HENT:     Head: Normocephalic and atraumatic.  Eyes:     Conjunctiva/sclera: Conjunctivae normal.  Cardiovascular:     Rate and Rhythm: Normal rate and regular rhythm.     Heart sounds: No murmur heard. Pulmonary:     Effort: Pulmonary effort is normal. No respiratory distress.     Breath sounds: Normal breath sounds.  Abdominal:     Palpations: Abdomen is soft.     Tenderness: There is no abdominal tenderness.  Musculoskeletal:        General: No swelling.     Cervical back: Neck supple.  Skin:    General: Skin is warm and dry.     Capillary Refill: Capillary refill takes less than 2 seconds.     Comments: Vascular access site in right upper arm with  no active bleeding, clot appears to be in place.  Palpable thrill, distal pulse intact  Neurological:     Mental Status: She is alert.  Psychiatric:        Mood and Affect: Mood normal.     ED Results / Procedures / Treatments   Labs (all labs ordered are listed, but only abnormal results are displayed) Labs Reviewed  BASIC METABOLIC PANEL - Abnormal; Notable for the following components:      Result Value   Chloride 97 (*)    BUN 66 (*)    Creatinine, Ser 12.97 (*)    GFR, Estimated 3 (*)    Anion gap 16 (*)    All other components within normal limits  CBC - Abnormal; Notable for the following components:   WBC 11.5 (*)    All other components within normal limits    EKG None  Radiology No results  found.  Procedures Procedures    Medications Ordered in ED Medications - No data to display  ED Course/ Medical Decision Making/ A&P                             Medical Decision Making  This patient presents to the ED for concern of bleeding from dialysis site, this involves an extensive number of treatment options, and is a complaint that carries with it a high risk of complications and morbidity.    Co morbidities that complicate the patient evaluation  End-stage renal disease   Additional history obtained:  Additional history obtained from EMS External records from outside source obtained and reviewed including notes from vascular from last July when patient had a new AV graft   Lab Tests:  I Ordered, and personally interpreted labs.  The pertinent results include: Hemoglobin 13.4   Consultations Obtained:  I requested consultation with the vascular surgeon, Dr.Clark and discussed lab and imaging findings as well as pertinent plan - they recommend: admission to medicine, NPO, plan for revision surgery later today  I requested consultation with the hospitalist.  Dr. Katrinka Blazing agrees to see the patient for admission    Social Determinants of Health:  Patient has end-stage renal disease on hemodialysis Monday Wednesday Friday   Test / Admission - Considered:  Vascular surgery evaluated the patient at bedside.  Patient's clot broke free and blood began to spurt.  The vascular surgeon placed a nylon suture to achieve hemostasis.  He requested admission to medicine and requested that we make the patient n.p.o. with plans for revision surgery this afternoon.  I requested admission with the hospitalist service         Final Clinical Impression(s) / ED Diagnoses Final diagnoses:  Complication of vascular access for dialysis, initial encounter    Rx / DC Orders ED Discharge Orders     None         Pamala Duffel 03/07/23 2956    Ernie Avena,  MD 03/07/23 2010

## 2023-03-07 NOTE — ED Notes (Signed)
Vascular at bedside

## 2023-03-07 NOTE — Brief Op Note (Signed)
03/07/2023  12:46 PM  PATIENT:  Victoria [name]  67 y.o. female  PRE-OPERATIVE DIAGNOSIS:  END STAGE RENAL DISEASE  POST-OPERATIVE DIAGNOSIS:  END STAGE RENAL DISEASE  PROCEDURE:  Procedure(s): REVISON OF ARTERIOVENOUS FISTULA ARM (Right) UNLTRASOUND GUIDED INSERTION OF TUNNELED DIALYSIS CATHETER (Right)  SURGEON:  Surgeon(s) and Role:    * Leonie Douglas, MD - Primary  PHYSICIAN ASSISTANT: Aggie Moats  ANESTHESIA:   general  EBL:    BLOOD ADMINISTERED:none  DRAINS: none   LOCAL MEDICATIONS USED:  NONE  SPECIMEN:  No Specimen  DISPOSITION OF SPECIMEN:  N/A  COUNTS:  YES correct  TOURNIQUET: none  DICTATION: .Dragon Dictation  PLAN OF CARE: Admit for overnight observation  PATIENT DISPOSITION:  PACU - hemodynamically stable.   Delay start of Pharmacological VTE agent (>24hrs) due to surgical blood loss or risk of bleeding: no

## 2023-03-07 NOTE — ED Provider Triage Note (Signed)
Emergency Medicine Provider Triage Evaluation Note  Victoria [name] , a 67 y.o. female  was evaluated in triage.  Pt complains of bleeding from her right upper extremity where her fistula for dialysis.  She has Monday Wednesday Friday dialysis patient had a full dialysis session Friday and states that she has had off-and-on bleeding since her last dialysis session on Friday.  She went to dialysis today and was sent here because of bleeding.   Review of Systems  Positive: Fistula bleeding Negative: Fever   Physical Exam  BP (!) 141/88 (BP Location: Left Arm)   Pulse 74   Temp 97.7 F (36.5 C) (Oral)   Resp 18   Ht 5\' 3"  (1.6 m)   Wt 77.1 kg   LMP  (LMP Unknown)   SpO2 98%   BMI 30.11 kg/m  Gen:   Awake, no distress   Resp:  Normal effort  MSK:   Moves extremities without difficulty  Other:    Medical Decision Making  Medically screening exam initiated at 6:26 AM.  Appropriate orders placed.  Victoria [name] was informed that the remainder of the evaluation will be completed by another provider, this initial triage assessment does not replace that evaluation, and the importance of remaining in the ED until their evaluation is complete.  Bleeding controlled.  Basic labs.   Solon Augusta [place], [place] 03/07/23 [phone]

## 2023-03-07 NOTE — Consult Note (Signed)
Hospital Consult    Reason for Consult: Bleeding right arm AV graft Referring Physician: ED MRN #:  [id number]  History of Present Illness: This is a 67 y.o. female with history of end-stage renal disease and hypertension that vascular surgery has been consulted for bleeding from a right arm AV graft.  Patient states this has been ongoing since Friday.  Every time she coughs it starts bleeding.  She did get dialysis on Friday but states today that she did not get dialysis and was sent here.  Initially noted to be hemostatic on arrival but then she coughed and the right arm graft started bleeding again.  Past Medical History:  Diagnosis Date   Arthritis    Chronic bronchitis (HCC)    Complication of anesthesia ~ 2011   "they gave me a medicine that swolled me and mouth burning up" (08/23/2012)   Complication of anesthesia    slow to wake up   ESRD (end stage renal disease) on dialysis [facility]) Nephrologist-- dr deterding   ESRD due to HTN-; "M/W/F; [address]" (11/28/2015   GERD (gastroesophageal reflux disease)    no meds, diet controlled   History of acute pulmonary edema    2003   Hypertension    no longer on medications   Left patella fracture    Pneumonia    as a child   Seasonal allergies    Secondary hyperparathyroidism, renal (HCC)    s/p  total parathyroidectomy  2014   Thyroid disease    Wears glasses    Wound dehiscence, surgical 11/28/2015    Past Surgical History:  Procedure Laterality Date   ANKLE FRACTURE SURGERY Right ~ 2005   "has pins in it"   APPLICATION OF WOUND VAC Left 11/28/2015   knee   APPLICATION OF WOUND VAC Left 11/28/2015   Procedure: APPLICATION OF WOUND VAC;  Surgeon: Samson Frederic, MD;  [facility];  Service: Orthopedics;  Laterality: Left;   ARTERIOVENOUS GRAFT PLACEMENT  03/25/2005   Right forearm  w/  multiple Revision's    AV FISTULA PLACEMENT Right 04/13/2022   Procedure: INSERTION OF RIGHT ARTERIOVENOUS (AV) 4-31mm x 45cm GORE-TEX  GRAFT;  Surgeon: Chuck Hint, MD;  Location: [facility] OR;  Service: Vascular;  Laterality: Right;   CARDIOVASCULAR STRESS TEST  08/24/2012   abnormal nuclear study/  inferolateral and anteroseptal areas of scar,  no ischemia/  normal LV function and wall motion, ef 77%   CATARACT EXTRACTION W/ INTRAOCULAR LENS IMPLANT Bilateral    cataract removal bilateral, lens implant in right eye   DIALYSIS FISTULA CREATION  09/20/1990   left upper arm ---  w/  Multiple Revision's until 2006   ECTOPIC PREGNANCY SURGERY  05/21/1969   FRACTURE SURGERY     HARDWARE REMOVAL Right 07/10/2020   Procedure: Removal of deep implant right fibula and tibia; steroid injection right ankle;  Surgeon: Toni Arthurs, MD;  [facility];  Service: Orthopedics;  Laterality: Right;   I & D EXTREMITY Left 11/28/2015   Procedure: IRRIGATION AND DEBRIDEMENT LEFT KNEE WOUND ;  Surgeon: Samson Frederic, MD;  [facility];  Service: Orthopedics;  Laterality: Left;   INCISION AND DRAINAGE OF WOUND Left 11/28/2015   knee   IR FLUORO GUIDE CV LINE LEFT  02/09/2022   ORIF PATELLA Left 10/31/2015   Procedure: OPEN REDUCTION INTERNAL (ORIF) FIXATION LEFT PATELLA;  Surgeon: Samson Frederic, MD;  Location: [facility] ;  Service: Orthopedics;  Laterality: Left;   PATELLAR TENDON  REPAIR  10/03/2012   Procedure: PATELLA TENDON REPAIR;  Surgeon: Senaida Lange, MD;  [facility];  Service: Orthopedics;  Laterality: Right;   PATELLECTOMY  10/03/2012   Procedure: PATELLECTOMY;  Surgeon: Senaida Lange, MD;  [facility];  Service: Orthopedics;  Laterality: Right;  RIGHT PARTIAL PATELLECTOMY AND PATELLA TENDON REPAIR   REVISION OF ARTERIOVENOUS GORETEX GRAFT  09/27/2012   Procedure: REVISION OF ARTERIOVENOUS GORETEX GRAFT;  Surgeon: Pryor Ochoa, MD;  Location: [facility] OR;  Service: Vascular;  Laterality: Right;  1) Replacement of venous half of loop with 6mm Gortex graft  2) Excision of erroded pseudoaneurysm of graft  with primary closure.   REVISION OF ARTERIOVENOUS GORETEX GRAFT Right 01/23/2013   Procedure: REVISION OF ARTERIOVENOUS GORETEX GRAFT;  Surgeon: Fransisco Hertz, MD;  Location: [facility] OR;  Service: Vascular;  Laterality: Right;  Using piece of 6mm x 20cm Gortex graft.    REVISION OF ARTERIOVENOUS GORETEX GRAFT Right 10/07/2015   Procedure: REVISION OF Right arm ARTERIOVENOUS GORETEX GRAFT;  Surgeon: Sherren Kerns, MD;  Location: [facility] OR;  Service: Vascular;  Laterality: Right;   REVISION OF ARTERIOVENOUS GORETEX GRAFT Right 02/17/2016   Procedure: REVISION OF ARTERIOVENOUS GORETEX GRAFT;  Surgeon: Sherren Kerns, MD;  Location: [facility] OR;  Service: Vascular;  Laterality: Right;   TOOTH EXTRACTION     10/2021   TOTAL ABDOMINAL HYSTERECTOMY  03/05/2005   w/  Right Ovarian Cystectomy   TOTAL PARATHYROIDECTOMY/  THYROID ISTHMUSECTOMY/  AUTOTRANSPLANTATION PARATHYROID TISSUE TO LEFT BRACHIORIADIALIS MUSCLE  02/18/2011   TRANSTHORACIC ECHOCARDIOGRAM  10/31/2012   mild LVH,  grade 1 diastolic dysfunction,  ef 55-65%/  mild MR/  trivial TR    Allergies  Allergen Reactions   Amlodipine Swelling   Cefazolin     Pt received Ancef on 10/21.2021 without issue   Cephalosporins Anaphylaxis    Pt received Ancef on 07/10/2020 without issue   Lisinopril Swelling and Other (See Comments)    "made my chest hurt"    Prior to Admission medications   Medication Sig Start Date End Date Taking? Authorizing Provider  AURYXIA 1 GM 210 MG(Fe) tablet Take 210 mg by mouth See admin instructions. Take 1 tablet by mouth three times a day with meals and take 1 tablet twice per day with snacks. 12/10/22  Yes [provider]  chlorhexidine (PERIDEX) 0.12 % solution Use as directed 15 mLs in the mouth or throat 2 (two) times daily. 02/12/23  Yes [provider]  midodrine (PROAMATINE) 10 MG tablet Take 10 mg by mouth See admin instructions. Take 1 tablet by mouth three times a week as directed take one 30 minutes  before starting dialysis and repeat x 1 halfway through tx. 12/08/22  Yes [provider]  sevelamer carbonate (RENVELA) 2.4 g PACK Take 4.8 g by mouth 3 (three) times daily with meals. 04/07/22  Yes [provider]  SODIUM FLUORIDE 5000 PLUS 1.1 % CREA dental cream Place 1 Application onto teeth daily. 02/12/23  Yes [provider]  VITAMIN D PO Take 1 tablet by mouth See admin instructions. Monday, Wednesday, Friday 04/14/22 04/13/23 Yes [provider]    Social History   Socioeconomic History   Marital status: Legally Separated    Spouse name: Not on file   Number of children: Not on file   Years of education: Not on file   Highest education level: Not on file  Occupational History   Not on file  Tobacco Use  Smoking status: Never    Passive exposure: Never   Smokeless tobacco: Never  Vaping Use   Vaping Use: Never used  Substance and Sexual Activity   Alcohol use: Not Currently    Comment: occasionally drinks a few beers   Drug use: No   Sexual activity: Yes    Birth control/protection: Surgical    Comment: Hysterectomy  Other Topics Concern   Not on file  Social History Narrative   Not on file   Social Determinants of Health   Financial Resource Strain: Low Risk  (06/24/2020)   Overall Financial Resource Strain (CARDIA)    Difficulty of Paying Living Expenses: Not hard at all  Food Insecurity: No Food Insecurity (06/24/2020)   Hunger Vital Sign    Worried About Running Out of Food in the Last Year: Never true    Ran Out of Food in the Last Year: Never true  Transportation Needs: No Transportation Needs (01/14/2023)   PRAPARE - Administrator, Civil Service (Medical): No    Lack of Transportation (Non-Medical): No  Physical Activity: Insufficiently Active (06/24/2020)   Exercise Vital Sign    Days of Exercise per Week: 6 days    Minutes of Exercise per Session: 20 min  Stress: No Stress Concern Present (06/24/2020)    [employer] of Occupational Health - Occupational Stress Questionnaire    Feeling of Stress : Not at all  Social Connections: Socially Isolated (06/24/2020)   Social Connection and Isolation Panel [NHANES]    Frequency of Communication with Friends and Family: More than three times a week    Frequency of Social Gatherings with Friends and Family: More than three times a week    Attends Religious Services: Never    Database administrator or Organizations: No    Attends Banker Meetings: Never    Marital Status: Separated  Intimate Partner Violence: Not At Risk (06/24/2020)   Humiliation, Afraid, Rape, and Kick questionnaire    Fear of Current or Ex-Partner: No    Emotionally Abused: No    Physically Abused: No    Sexually Abused: No     Family History  Problem Relation Age of Onset   Healthy Mother    Hypertension Father    Kidney failure Father    Hypertension Sister    Thyroid disease Sister     ROS: [x]  Positive   [ ]  Negative   [ ]  All sytems reviewed and are negative  Cardiovascular: []  chest pain/pressure []  palpitations []  SOB lying flat []  DOE []  pain in legs while walking []  pain in legs at rest []  pain in legs at night []  non-healing ulcers []  hx of DVT []  swelling in legs  Pulmonary: []  productive cough []  asthma/wheezing []  home O2  Neurologic: []  weakness in []  arms []  legs []  numbness in []  arms []  legs []  hx of CVA []  mini stroke [] difficulty speaking or slurred speech []  temporary loss of vision in one eye []  dizziness  Hematologic: []  hx of cancer []  bleeding problems []  problems with blood clotting easily  Endocrine:   []  diabetes []  thyroid disease  GI []  vomiting blood []  blood in stool  GU: []  CKD/renal failure []  HD--[]  M/W/F or []  T/T/S []  burning with urination []  blood in urine  Psychiatric: []  anxiety []  depression  Musculoskeletal: []  arthritis []  joint pain  Integumentary: []  rashes []   ulcers  Constitutional: []  fever []  chills   Physical Examination  Vitals:  03/07/23 0630 03/07/23 0730  BP: 117/64 119/63  Pulse: 71 71  Resp:    Temp:    SpO2: 96% 94%   Body mass index is 30.11 kg/m.  General:  NAD Gait: Not observed HENT: WNL, normocephalic Pulmonary: normal non-labored breathing Cardiac: regular, without  Murmurs, rubs or gallops Abdomen:  soft, NT/ND, no masses Vascular Exam/Pulses: Right upper arm AV graft with small ulcer with active arterial bleeding, notable thrill Musculoskeletal: no muscle wasting or atrophy  Neurologic: A&O X 3; Appropriate Affect ; SENSATION: normal; MOTOR FUNCTION:  moving all extremities equally. Speech is fluent/normal   CBC    Component Value Date/Time   WBC 11.5 (H) 03/07/2023 0627   RBC 4.94 03/07/2023 0627   HGB 13.4 03/07/2023 0627   HCT 40.8 03/07/2023 0627   PLT 215 03/07/2023 0627   MCV 82.6 03/07/2023 0627   MCH 27.1 03/07/2023 0627   MCHC 32.8 03/07/2023 0627   RDW 14.9 03/07/2023 0627   LYMPHSABS 1.1 05/30/2020 1430   MONOABS 0.8 05/30/2020 1430   EOSABS 0.1 05/30/2020 1430   BASOSABS 0.1 05/30/2020 1430    BMET    Component Value Date/Time   NA 137 03/07/2023 0627   NA 140 06/30/2022 0000   K 4.5 03/07/2023 0627   CL 97 (L) 03/07/2023 0627   CO2 24 03/07/2023 0627   GLUCOSE 86 03/07/2023 0627   BUN 66 (H) 03/07/2023 0627   CREATININE 12.97 (H) 03/07/2023 0627   CALCIUM 10.1 03/07/2023 0627   GFRNONAA 3 (L) 03/07/2023 0627   GFRAA 7 (L) 05/30/2020 1430    COAGS: Lab Results  Component Value Date   INR 1.07 11/28/2015   INR 0.96 09/27/2012   INR 0.90 02/18/2011     Non-Invasive Vascular Imaging:    N/A   ASSESSMENT/PLAN: This is a 67 y.o. female with history of end-stage renal disease and hypertension that vascular surgery has been consulted for bleeding from a right arm AV graft.  This was initially hemostatic on arrival.  During our evaluation she coughed and it started  bleeding again.  She has a small ulcer over the graft.  I have recommended AV graft revision with TDC in the operating room.  Please keep her n.p.o.  Appreciate hospitalist for admission as she missed her dialysis today.  I did put a stitch in the bleeding site in the ED and this is now hemostatic.  We will get to her today as soon as we can.  Risks and benefits discussed.    Cephus Shelling, MD [facility] Office: [phone]  Cephus Shelling

## 2023-03-07 NOTE — Care Management CC44 (Signed)
Condition Code 44 Documentation Completed  Patient Details  Name: Victoria [name] MRN: [id number] Date of Birth: [birth year]   Condition Code 44 given:  Yes Patient signature on Condition Code 44 notice:  Yes Documentation of 2 MD's agreement:  Yes Code 44 added to claim:  Yes    Tom-Johnson, Hershal Coria, RN 03/07/2023, 3:35 PM

## 2023-03-07 NOTE — Consult Note (Addendum)
[facility] Renal Consultation Note    Indication for Consultation:  Management of ESRD/hemodialysis; anemia, hypertension/volume and secondary hyperparathyroidism   HPI: Victoria [name] is a 67 y.o. female with ESRD on HD who is admitted with prolonged bleeding from AV graft. Dialyzes at [facility] MWF.  She woke up this morning with her nightgown soaked with blood. Presented to her dialysis unit and with continued bleeding and was sent to the ED for evaluation. Vascular surgery consulted, found to have small ulcer over the graft. She was taken to the OR and underwent revision surgery and placement of tunneled dialysis catheter by Dr. Lenell Antu this afternoon.   Seen in room, just up from the PACU. A little groggy but recalls last night's events. No particular complaints at this time.   Past Medical History:  Diagnosis Date   Arthritis    Chronic bronchitis (HCC)    Complication of anesthesia ~ 2011   "they gave me a medicine that swolled me and mouth burning up" (08/23/2012)   Complication of anesthesia    slow to wake up   ESRD (end stage renal disease) on dialysis [facility]) Nephrologist-- dr deterding   ESRD due to HTN-; "M/W/F; [address]" (11/28/2015   GERD (gastroesophageal reflux disease)    no meds, diet controlled   History of acute pulmonary edema    2003   Hypertension    no longer on medications   Left patella fracture    Pneumonia    as a child   Seasonal allergies    Secondary hyperparathyroidism, renal (HCC)    s/p  total parathyroidectomy  2014   Thyroid disease    Wears glasses    Wound dehiscence, surgical 11/28/2015   Past Surgical History:  Procedure Laterality Date   ANKLE FRACTURE SURGERY Right ~ 2005   "has pins in it"   APPLICATION OF WOUND VAC Left 11/28/2015   knee   APPLICATION OF WOUND VAC Left 11/28/2015   Procedure: APPLICATION OF WOUND VAC;  Surgeon: Samson Frederic, MD;  [facility] OR;  Service: Orthopedics;  Laterality:  Left;   ARTERIOVENOUS GRAFT PLACEMENT  03/25/2005   Right forearm  w/  multiple Revision's    AV FISTULA PLACEMENT Right 04/13/2022   Procedure: INSERTION OF RIGHT ARTERIOVENOUS (AV) 4-26mm x 45cm GORE-TEX GRAFT;  Surgeon: Chuck Hint, MD;  Location: [facility] OR;  Service: Vascular;  Laterality: Right;   CARDIOVASCULAR STRESS TEST  08/24/2012   abnormal nuclear study/  inferolateral and anteroseptal areas of scar,  no ischemia/  normal LV function and wall motion, ef 77%   CATARACT EXTRACTION W/ INTRAOCULAR LENS IMPLANT Bilateral    cataract removal bilateral, lens implant in right eye   DIALYSIS FISTULA CREATION  09/20/1990   left upper arm ---  w/  Multiple Revision's until 2006   ECTOPIC PREGNANCY SURGERY  05/21/1969   FRACTURE SURGERY     HARDWARE REMOVAL Right 07/10/2020   Procedure: Removal of deep implant right fibula and tibia; steroid injection right ankle;  Surgeon: Toni Arthurs, MD;  [facility] OR;  Service: Orthopedics;  Laterality: Right;   I & D EXTREMITY Left 11/28/2015   Procedure: IRRIGATION AND DEBRIDEMENT LEFT KNEE WOUND ;  Surgeon: Samson Frederic, MD;  [facility] OR;  Service: Orthopedics;  Laterality: Left;   INCISION AND DRAINAGE OF WOUND Left 11/28/2015   knee   IR FLUORO GUIDE CV LINE LEFT  02/09/2022   ORIF PATELLA Left 10/31/2015   Procedure: OPEN REDUCTION  INTERNAL (ORIF) FIXATION LEFT PATELLA;  Surgeon: Samson Frederic, MD;  Location: [facility];  Service: Orthopedics;  Laterality: Left;   PATELLAR TENDON REPAIR  10/03/2012   Procedure: PATELLA TENDON REPAIR;  Surgeon: Senaida Lange, MD;  [facility] OR;  Service: Orthopedics;  Laterality: Right;   PATELLECTOMY  10/03/2012   Procedure: PATELLECTOMY;  Surgeon: Senaida Lange, MD;  [facility] OR;  Service: Orthopedics;  Laterality: Right;  RIGHT PARTIAL PATELLECTOMY AND PATELLA TENDON REPAIR   REVISION OF ARTERIOVENOUS GORETEX GRAFT  09/27/2012   Procedure: REVISION OF ARTERIOVENOUS  GORETEX GRAFT;  Surgeon: Pryor Ochoa, MD;  Location: [facility] OR;  Service: Vascular;  Laterality: Right;  1) Replacement of venous half of loop with 6mm Gortex graft  2) Excision of erroded pseudoaneurysm of graft with primary closure.   REVISION OF ARTERIOVENOUS GORETEX GRAFT Right 01/23/2013   Procedure: REVISION OF ARTERIOVENOUS GORETEX GRAFT;  Surgeon: Fransisco Hertz, MD;  Location: [facility] OR;  Service: Vascular;  Laterality: Right;  Using piece of 6mm x 20cm Gortex graft.    REVISION OF ARTERIOVENOUS GORETEX GRAFT Right 10/07/2015   Procedure: REVISION OF Right arm ARTERIOVENOUS GORETEX GRAFT;  Surgeon: Sherren Kerns, MD;  Location: [facility] OR;  Service: Vascular;  Laterality: Right;   REVISION OF ARTERIOVENOUS GORETEX GRAFT Right 02/17/2016   Procedure: REVISION OF ARTERIOVENOUS GORETEX GRAFT;  Surgeon: Sherren Kerns, MD;  Location: [facility] OR;  Service: Vascular;  Laterality: Right;   TOOTH EXTRACTION     10/2021   TOTAL ABDOMINAL HYSTERECTOMY  03/05/2005   w/  Right Ovarian Cystectomy   TOTAL PARATHYROIDECTOMY/  THYROID ISTHMUSECTOMY/  AUTOTRANSPLANTATION PARATHYROID TISSUE TO LEFT BRACHIORIADIALIS MUSCLE  02/18/2011   TRANSTHORACIC ECHOCARDIOGRAM  10/31/2012   mild LVH,  grade 1 diastolic dysfunction,  ef 55-65%/  mild MR/  trivial TR   Family History  Problem Relation Age of Onset   Healthy Mother    Hypertension Father    Kidney failure Father    Hypertension Sister    Thyroid disease Sister    Social History:  reports that she has never smoked. She has never been exposed to tobacco smoke. She has never used smokeless tobacco. She reports that she does not currently use alcohol. She reports that she does not use drugs. Allergies  Allergen Reactions   Amlodipine Swelling   Cefazolin     Pt received Ancef on 10/21.2021 without issue   Cephalosporins Anaphylaxis    Pt received Ancef on 07/10/2020 without issue   Lisinopril Swelling and Other (See Comments)    "made my chest hurt"    Prior to Admission medications   Medication Sig Start Date End Date Taking? Authorizing Provider  AURYXIA 1 GM 210 MG(Fe) tablet Take 210 mg by mouth See admin instructions. Take 1 tablet by mouth three times a day with meals and take 1 tablet twice per day with snacks. 12/10/22  Yes [provider]  chlorhexidine (PERIDEX) 0.12 % solution Use as directed 15 mLs in the mouth or throat 2 (two) times daily. 02/12/23  Yes [provider]  midodrine (PROAMATINE) 10 MG tablet Take 10 mg by mouth See admin instructions. Take 1 tablet by mouth three times a week as directed take one 30 minutes before starting dialysis and repeat x 1 halfway through tx. 12/08/22  Yes [provider]  sevelamer carbonate (RENVELA) 2.4 g PACK Take 4.8 g by mouth 3 (three) times daily with meals. 04/07/22  Yes [provider]  SODIUM FLUORIDE 5000 PLUS 1.1 % CREA dental cream Place 1 Application onto teeth daily. 02/12/23  Yes [provider]  VITAMIN D PO Take 1 tablet by mouth See admin instructions. Monday, Wednesday, Friday 04/14/22 04/13/23 Yes [provider]   Current Facility-Administered Medications  Medication Dose Route Frequency Provider Last Rate Last Admin   acetaminophen (TYLENOL) tablet 650 mg  650 mg Oral Q6H PRN Clydie Braun, MD       Or   acetaminophen (TYLENOL) suppository 650 mg  650 mg Rectal Q6H PRN Madelyn Flavors A, MD       albuterol (PROVENTIL) (2.5 MG/3ML) 0.083% nebulizer solution 2.5 mg  2.5 mg Nebulization Q6H PRN Katrinka Blazing, Rondell A, MD       chlorhexidine (PERIDEX) 0.12 % solution 15 mL  15 mL Mouth/Throat BID Smith, Rondell A, MD       fentaNYL (SUBLIMAZE) 100 MCG/2ML injection            ferric citrate (AURYXIA) tablet 210 mg  210 mg Oral TID WC Smith, Rondell A, MD       HYDROmorphone (DILAUDID) injection 0.5-1 mg  0.5-1 mg Intravenous Q2H PRN Emilie Rutter, PA-C       midodrine (PROAMATINE) tablet 10 mg  10 mg Oral Q M,W,F-HD Smith,  Rondell A, MD       oxyCODONE-acetaminophen (PERCOCET/ROXICET) 5-325 MG per tablet 1-2 tablet  1-2 tablet Oral Q4H PRN Emilie Rutter, PA-C       sevelamer carbonate (RENVELA) powder PACK 4.8 g  4.8 g Oral TID WC Smith, Rondell A, MD       sodium chloride flush (NS) 0.9 % injection 3 mL  3 mL Intravenous Q12H Smith, Rondell A, MD         ROS: As per HPI otherwise negative.  Physical Exam: Vitals:   03/07/23 1345 03/07/23 1400 03/07/23 1415 03/07/23 1431  BP: 91/62 101/67 96/65 103/74  Pulse: 77 70 66 73  Resp: 16 12 12 16   Temp:   97.7 F (36.5 C) 98.1 F (36.7 C)  TempSrc:      SpO2: 90% 96% 97% 96%  Weight:      Height:         General: Appears comfortable, in no distress  Head: NCAT sclera not icteric MMM Neck: Supple. No JVD appreciated  Lungs: Clear bilaterally without wheezes, rales, or rhonchi. Normal WOB  Heart: RRR, no murmur, rub, or gallop  Abdomen: soft non-tender, bowel sounds normal, no masses  Lower extremities:without edema or ischemic changes, no open wounds  Neuro: A & O X 3. Moves all extremities spontaneously. Psych:  Responds to questions appropriately with a normal affect. Dialysis Access:  R arm bandaged + bruit; L fem TDC in place   Labs: Basic Metabolic Panel: Recent Labs  Lab 03/07/23 0627  NA 137  K 4.5  CL 97*  CO2 24  GLUCOSE 86  BUN 66*  CREATININE 12.97*  CALCIUM 10.1   Liver Function Tests: No results for input(s): "AST", "ALT", "ALKPHOS", "BILITOT", "PROT", "ALBUMIN" in the last 168 hours. No results for input(s): "LIPASE", "AMYLASE" in the last 168 hours. No results for input(s): "AMMONIA" in the last 168 hours. CBC: Recent Labs  Lab 03/07/23 0627  WBC 11.5*  HGB 13.4  HCT 40.8  MCV 82.6  PLT 215   Cardiac Enzymes: No results for input(s): "CKTOTAL", "CKMB", "CKMBINDEX", "TROPONINI" in the last 168 hours. CBG: No results for input(s): "GLUCAP" in the last 168 hours. Iron Studies:  No results for input(s): "IRON",  "TIBC", "TRANSFERRIN", "FERRITIN" in the last 72 hours. Studies/Results: DG C-Arm 1-60 Min-No Report  Result Date: 03/07/2023 Fluoroscopy was utilized by the requesting physician.  No radiographic interpretation.    Dialysis Orders:  [facility] MWF 3:30 400/A1.5x 2K/3Ca EDW 79 kg Heparin 5000 -No ESA  -Calcitriol TIW  Assessment/Plan: Prolonged bleeding from AV graft - s/p revision with new L fem TDC placed per Dr. Lenell Antu. Obs overnight.  ESRD -  HD MWF. Missed usual dialysis today. Roll over to Tues am d/t high inpatient census tonight.  No heparin Hypertension/volume  - BP/volume acceptable. Continue home meds  Anemia  - Hgb above goal. No ESA needs currently. Follow trends post -op Metabolic bone disease -  Continue home binders/calcitriol  Nutrition - Renal diet with fluid restriction.   Tomasa Blase PA-C [facility] 03/07/2023, 2:52 PM

## 2023-03-07 NOTE — Telephone Encounter (Signed)
automated vm stated call cannot be completed at this time.

## 2023-03-07 NOTE — ED Triage Notes (Signed)
Patient BIB EMS from dialysis center due to dialysis graft problem. Patient states it has been bleeding since Friday. Patient states it starts bleeding anytime dressing is taken off. Patient was at dialysis center and was told she would need to be taken to the ED. Bleeding controlled at this time. Patient A&Ox4.

## 2023-03-07 NOTE — Anesthesia Preprocedure Evaluation (Addendum)
Anesthesia Evaluation  Patient identified by MRN, date of birth, ID band Patient awake    Reviewed: Allergy & Precautions, NPO status , Patient's Chart, lab work & pertinent test results  History of Anesthesia Complications Negative for: history of anesthetic complications  Airway Mallampati: I  TM Distance: >3 FB     Dental  (+) Dental Advisory Given, Partial Upper, Partial Lower   Pulmonary neg pulmonary ROS   Pulmonary exam normal        Cardiovascular hypertension, Pt. on medications Normal cardiovascular exam  TTE 2018 Study Conclusions   - Left ventricle: The cavity size was normal. Wall thickness was    increased in a pattern of mild LVH. Systolic function was normal.    The estimated ejection fraction was in the range of 55% to 60%.    Doppler parameters are consistent with abnormal left ventricular    relaxation (grade 1 diastolic dysfunction).  - Atrial septum: No defect or patent foramen ovale was identified.     Neuro/Psych negative neurological ROS     GI/Hepatic Neg liver ROS,GERD  Controlled,,  Endo/Other  negative endocrine ROS    Renal/GU ESRF and DialysisRenal disease     Musculoskeletal   Abdominal   Peds  Hematology negative hematology ROS (+)   Anesthesia Other Findings   Reproductive/Obstetrics                             Anesthesia Physical Anesthesia Plan  ASA: 3  Anesthesia Plan: General   Post-op Pain Management: Tylenol PO (pre-op)*   Induction:   PONV Risk Score and Plan: 3 and Ondansetron, Treatment may vary due to age or medical condition, Midazolam and Dexamethasone  Airway Management Planned: LMA and Oral ETT  Additional Equipment: None  Intra-op Plan:   Post-operative Plan: Extubation in OR  Informed Consent: I have reviewed the patients History and Physical, chart, labs and discussed the procedure including the risks, benefits and  alternatives for the proposed anesthesia with the patient or authorized representative who has indicated his/her understanding and acceptance.     Dental advisory given  Plan Discussed with: Anesthesiologist and CRNA  Anesthesia Plan Comments:         Anesthesia Quick Evaluation

## 2023-03-07 NOTE — Brief Op Note (Signed)
   R arm AVG revision and TDC placement 03/07/23 by Dr. Lenell Antu due to bleeding AVG.  Plan will be to use [facility] for at least 6 weeks.  Office will arrange wound check/staple removal in 2-3 weeks.  Emilie Rutter, PA-C [facility] [phone] 03/07/2023  12:09 PM

## 2023-03-07 NOTE — Transfer of Care (Signed)
Immediate Anesthesia Transfer of Care Note  Patient: Victoria [name]  Procedure(s) Performed: REVISON OF ARTERIOVENOUS FISTULA ARM (Right) UNLTRASOUND GUIDED INSERTION OF TUNNELED DIALYSIS CATHETER (Right: Groin)  Patient Location: PACU  Anesthesia Type:General  Level of Consciousness: drowsy  Airway & Oxygen Therapy: Patient Spontanous Breathing and Patient connected to face mask oxygen  Post-op Assessment: Report given to RN and Post -op Vital signs reviewed and stable  Post vital signs: Reviewed and stable  Last Vitals: see postop VS flowsheet Vitals Value Taken Time  BP    Temp    Pulse    Resp    SpO2      Last Pain:  Vitals:   03/07/23 0958  TempSrc: Oral  PainSc: 0-No pain         Complications: No notable events documented.

## 2023-03-07 NOTE — Anesthesia Procedure Notes (Addendum)
Procedure Name: LMA Insertion Date/Time: 03/07/2023 10:41 AM  Performed by: Sheppard Evens, CRNAPre-anesthesia Checklist: Patient identified, Emergency Drugs available, Suction available and Patient being monitored Patient Re-evaluated:Patient Re-evaluated prior to induction Oxygen Delivery Method: Circle System Utilized Preoxygenation: Pre-oxygenation with 100% oxygen Induction Type: IV induction Ventilation: Mask ventilation without difficulty LMA: LMA inserted LMA Size: 4.0 Number of attempts: 1 Airway Equipment and Method: Bite block Placement Confirmation: positive ETCO2 Tube secured with: Tape Dental Injury: Teeth and Oropharynx as per pre-operative assessment

## 2023-03-07 NOTE — ED Notes (Signed)
Pt well appearing upon transfer to floor. 

## 2023-03-07 NOTE — H&P (Signed)
History and Physical    Patient: Victoria [name] ZOX:[id number] DOB: 03/01/1956 DOA: 03/07/2023 DOS: the patient was seen and examined on 03/07/2023 PCP: Anne Ng, NP  Patient coming from: Hemodialysis  Chief Complaint:  Chief Complaint  Patient presents with   Vascular Access Problem   HPI: Victoria STANISZEWSKI is a 67 y.o. female with medical history significant of ESRD on HD (MWF), secondary hyperparathyroidism s/p total parathyroidectomy, arthritis, and GERD who presents with bleeding from her AV fistula.  Symptoms started 3 days ago before she was put on the machine her last hemodialysis session.  She completed a full session at that time.  However, over the weekend she reported continued intermittent bleeding from her AV fistula.  Anytime she coughed cause blood to squirt out like water from her graft.  She would apply pressure to control bleeding temporarily.  Patient reports that she had to clean her house several times because of these episodes.  Denies having anything like this ever happening before.  She is not on any blood thinners.  Patient denies having any lightheadedness, palpitations, lower extremity swelling,.  This morning she woke up with her gown soaked in blood.  She had gone to hemodialysis and was sent to the hospital to bleeding.  In the emergency department patient was noted to be afebrile with stable vital signs.  Labs significant for WBC 11.5, potassium 4.5, BUN 66, and creatinine 12.97.  Vascular surgery had been consulted and placed stitch and AV fistula with control of bleeding.  TRH called to admit.  Review of Systems: As mentioned in the history of present illness. All other systems reviewed and are negative. Past Medical History:  Diagnosis Date   Arthritis    Chronic bronchitis (HCC)    Complication of anesthesia ~ 2011   "they gave me a medicine that swolled me and mouth burning up" (08/23/2012)   Complication of anesthesia    slow to wake up   ESRD (end  stage renal disease) on dialysis [facility]) Nephrologist-- dr deterding   ESRD due to HTN-; "M/W/F; [address]" (11/28/2015   GERD (gastroesophageal reflux disease)    no meds, diet controlled   History of acute pulmonary edema    2003   Hypertension    no longer on medications   Left patella fracture    Pneumonia    as a child   Seasonal allergies    Secondary hyperparathyroidism, renal (HCC)    s/p  total parathyroidectomy  2014   Thyroid disease    Wears glasses    Wound dehiscence, surgical 11/28/2015   Past Surgical History:  Procedure Laterality Date   ANKLE FRACTURE SURGERY Right ~ 2005   "has pins in it"   APPLICATION OF WOUND VAC Left 11/28/2015   knee   APPLICATION OF WOUND VAC Left 11/28/2015   Procedure: APPLICATION OF WOUND VAC;  Surgeon: Samson Frederic, MD;  Location: [facility] OR;  Service: Orthopedics;  Laterality: Left;   ARTERIOVENOUS GRAFT PLACEMENT  03/25/2005   Right forearm  w/  multiple Revision's    AV FISTULA PLACEMENT Right 04/13/2022   Procedure: INSERTION OF RIGHT ARTERIOVENOUS (AV) 4-63mm x 45cm GORE-TEX GRAFT;  Surgeon: Chuck Hint, MD;  Location: [facility] OR;  Service: Vascular;  Laterality: Right;   CARDIOVASCULAR STRESS TEST  08/24/2012   abnormal nuclear study/  inferolateral and anteroseptal areas of scar,  no ischemia/  normal LV function and wall motion, ef 77%   CATARACT EXTRACTION W/ INTRAOCULAR LENS IMPLANT  Bilateral    cataract removal bilateral, lens implant in right eye   DIALYSIS FISTULA CREATION  09/20/1990   left upper arm ---  w/  Multiple Revision's until 2006   ECTOPIC PREGNANCY SURGERY  05/21/1969   FRACTURE SURGERY     HARDWARE REMOVAL Right 07/10/2020   Procedure: Removal of deep implant right fibula and tibia; steroid injection right ankle;  Surgeon: Toni Arthurs, MD;  Location: [facility] OR;  Service: Orthopedics;  Laterality: Right;   I & D EXTREMITY Left 11/28/2015   Procedure: IRRIGATION AND DEBRIDEMENT LEFT KNEE WOUND ;  Surgeon:  Samson Frederic, MD;  Location: [facility] OR;  Service: Orthopedics;  Laterality: Left;   INCISION AND DRAINAGE OF WOUND Left 11/28/2015   knee   IR FLUORO GUIDE CV LINE LEFT  02/09/2022   ORIF PATELLA Left 10/31/2015   Procedure: OPEN REDUCTION INTERNAL (ORIF) FIXATION LEFT PATELLA;  Surgeon: Samson Frederic, MD;  Location: [facility];  Service: Orthopedics;  Laterality: Left;   PATELLAR TENDON REPAIR  10/03/2012   Procedure: PATELLA TENDON REPAIR;  Surgeon: Senaida Lange, MD;  Location: [facility] OR;  Service: Orthopedics;  Laterality: Right;   PATELLECTOMY  10/03/2012   Procedure: PATELLECTOMY;  Surgeon: Senaida Lange, MD;  Location: [facility] OR;  Service: Orthopedics;  Laterality: Right;  RIGHT PARTIAL PATELLECTOMY AND PATELLA TENDON REPAIR   REVISION OF ARTERIOVENOUS GORETEX GRAFT  09/27/2012   Procedure: REVISION OF ARTERIOVENOUS GORETEX GRAFT;  Surgeon: Pryor Ochoa, MD;  Location: [facility] OR;  Service: Vascular;  Laterality: Right;  1) Replacement of venous half of loop with 6mm Gortex graft  2) Excision of erroded pseudoaneurysm of graft with primary closure.   REVISION OF ARTERIOVENOUS GORETEX GRAFT Right 01/23/2013   Procedure: REVISION OF ARTERIOVENOUS GORETEX GRAFT;  Surgeon: Fransisco Hertz, MD;  Location: [facility] OR;  Service: Vascular;  Laterality: Right;  Using piece of 6mm x 20cm Gortex graft.    REVISION OF ARTERIOVENOUS GORETEX GRAFT Right 10/07/2015   Procedure: REVISION OF Right arm ARTERIOVENOUS GORETEX GRAFT;  Surgeon: Sherren Kerns, MD;  Location: [facility] OR;  Service: Vascular;  Laterality: Right;   REVISION OF ARTERIOVENOUS GORETEX GRAFT Right 02/17/2016   Procedure: REVISION OF ARTERIOVENOUS GORETEX GRAFT;  Surgeon: Sherren Kerns, MD;  Location: [facility] OR;  Service: Vascular;  Laterality: Right;   TOOTH EXTRACTION     10/2021   TOTAL ABDOMINAL HYSTERECTOMY  03/05/2005   w/  Right Ovarian Cystectomy   TOTAL PARATHYROIDECTOMY/  THYROID ISTHMUSECTOMY/  AUTOTRANSPLANTATION PARATHYROID  TISSUE TO LEFT BRACHIORIADIALIS MUSCLE  02/18/2011   TRANSTHORACIC ECHOCARDIOGRAM  10/31/2012   mild LVH,  grade 1 diastolic dysfunction,  ef 55-65%/  mild MR/  trivial TR   Social History:  reports that she has never smoked. She has never been exposed to tobacco smoke. She has never used smokeless tobacco. She reports that she does not currently use alcohol. She reports that she does not use drugs.  Allergies  Allergen Reactions   Amlodipine Swelling   Cefazolin     Pt received Ancef on 10/21.2021 without issue   Cephalosporins Anaphylaxis    Pt received Ancef on 07/10/2020 without issue   Lisinopril Swelling and Other (See Comments)    "made my chest hurt"    Family History  Problem Relation Age of Onset   Healthy Mother    Hypertension Father    Kidney failure Father    Hypertension Sister    Thyroid disease Sister     Prior  to Admission medications   Medication Sig Start Date End Date Taking? Authorizing Provider  AURYXIA 1 GM 210 MG(Fe) tablet Take by mouth. 12/10/22   [provider]  midodrine (PROAMATINE) 10 MG tablet Take by mouth. 12/08/22   [provider]  sevelamer carbonate (RENVELA) 2.4 g PACK Take by mouth. 04/07/22   [provider]  VITAMIN D PO Take by mouth. 04/14/22 04/13/23  [provider]    Physical Exam: Vitals:   03/07/23 6578 03/07/23 0612 03/07/23 0630 03/07/23 0730  BP:  (!) 141/88 117/64 119/63  Pulse:  74 71 71  Resp:  18    Temp:  97.7 F (36.5 C)    TempSrc:  Oral    SpO2: 98% 98% 96% 94%  Weight:  77.1 kg    Height:  5\' 3"  (1.6 m)      Constitutional: Elderly female currently no acute distress Eyes: PERRL, lids and conjunctivae normal ENMT: Mucous membranes are moist.   Neck: normal, supple  Respiratory: clear to auscultation bilaterally, no wheezing, no crackles. Normal respiratory effort. No accessory muscle use.  Cardiovascular: Regular rate and rhythm, no murmurs / rubs / gallops. No extremity  edema.  Right arm AV fistula in place with dried blood surrounding with stitch in place. Abdomen: no tenderness, no masses palpated.  Bowel sounds positive.  Musculoskeletal: no clubbing / cyanosis. No joint deformity upper and lower extremities. Good ROM, no contractures. Normal muscle tone.  Skin: no rashes, lesions, ulcers. No induration Neurologic: CN 2-12 grossly intact. Strength 5/5 in all 4.  Psychiatric: Normal judgment and insight. Alert and oriented x 3. Normal mood.   Data Reviewed:  Reviewed labs, imaging, and pertinent records as noted above in HPI.  Assessment and Plan: Bleeding from AV fistula Acute.  Patient presents with a 3-day history of bleeding from her right AV fistula graft which had initially been placed back in 03/2022 by Dr. Edilia Bo.  Patient not on any blood thinners.  Hemoglobin 13.4 and last available recorded hemoglobin 15.4 on 07/14/2022.  Vascular surgery consulted, noted a ulcer present, and placed stitch with control of bleeding with plans to take to the operating room for revision of her graft. -Admit to a medical telemetry bed -N.p.o. for need for procedure -Presurgical antibiotics per vascular -Appreciate vascular surgery consultative services, will follow-up for any further recommendations  Leukocytosis WBC elevated at 11.5.  Suspect secondary to above.  Patient afebrile and no other SIRS criteria met. -Recheck CBC tomorrow morning  ESRD on HD Patient normally dialyzes Monday, Wednesday, Friday.  Last hemodialysis 3 days ago.  Labs potassium 4.5, BUN 66, and creatinine 12.97. -Check renal function panel daily -Dr. Signe Colt of nephrology consulted for need of hemodialysis  Secondary hyperparathyroidism s/p total parathyroidectomy -Continue home medication regimen  Hypotension Blood pressures currently maintained.  Patient on midodrine prior to and after hemodialysis. -Continue midodrine   DVT prophylaxis: SCDs Advance Care Planning:   Code Status:  Full Code   Consults: Vascular surgery, nephrology  Family Communication: Patient's daughter updated over the phone.  Severity of Illness: The appropriate patient status for this patient is INPATIENT. Inpatient status is judged to be reasonable and necessary in order to provide the required intensity of service to ensure the patient's safety. The patient's presenting symptoms, physical exam findings, and initial radiographic and laboratory data in the context of their chronic comorbidities is felt to place them at high risk for further clinical deterioration. Furthermore, it is not anticipated that the patient  will be medically stable for discharge from the hospital within 2 midnights of admission.   * I certify that at the point of admission it is my clinical judgment that the patient will require inpatient hospital care spanning beyond 2 midnights from the point of admission due to high intensity of service, high risk for further deterioration and high frequency of surveillance required.*  Author: Clydie Braun, MD 03/07/2023 8:28 AM  For on call review [email or web address].

## 2023-03-07 NOTE — Anesthesia Procedure Notes (Signed)
Procedure Name: Intubation Date/Time: 03/07/2023 12:22 PM  Performed by: Sheppard Evens, CRNAPre-anesthesia Checklist: Patient identified, Emergency Drugs available, Suction available and Patient being monitored Patient Re-evaluated:Patient Re-evaluated prior to induction Oxygen Delivery Method: Circle System Utilized Preoxygenation: Pre-oxygenation with 100% oxygen Induction Type: IV induction Ventilation: Mask ventilation without difficulty Laryngoscope Size: Mac and 3 Grade View: Grade II Tube type: Oral Tube size: 7.0 mm Number of attempts: 1 Airway Equipment and Method: Stylet and Oral airway Placement Confirmation: ETT inserted through vocal cords under direct vision, positive ETCO2 and breath sounds checked- equal and bilateral Tube secured with: Tape Dental Injury: Teeth and Oropharynx as per pre-operative assessment

## 2023-03-07 NOTE — ED Notes (Signed)
Transfer of care repot given to lisa RN in PACU.

## 2023-03-07 NOTE — Discharge Instructions (Signed)
° °  [facility] [place] ° °Discharge Instructions ° °AV Fistula or Graft Surgery for Dialysis Access ° °Please refer to the following instructions for your post-procedure care. Your surgeon or physician assistant will discuss any changes with you. ° °Activity ° °You may drive the day following your surgery, if you are comfortable and no longer taking prescription pain medication. Resume full activity as the soreness in your incision resolves. ° °Bathing/Showering ° °You may shower after you go home. Keep your incision dry for 48 hours. Do not soak in a bathtub, hot tub, or swim until the incision heals completely. You may not shower if you have a hemodialysis catheter. ° °Incision Care ° °Clean your incision with mild soap and water after 48 hours. Pat the area dry with a clean towel. You do not need a bandage unless otherwise instructed. Do not apply any ointments or creams to your incision. You may have skin glue on your incision. Do not peel it off. It will come off on its own in about one week. Your arm may swell a bit after surgery. To reduce swelling use pillows to elevate your arm so it is above your heart. Your doctor will tell you if you need to lightly wrap your arm with an ACE bandage. ° °Diet ° °Resume your normal diet. There are not special food restrictions following this procedure. In order to heal from your surgery, it is CRITICAL to get adequate nutrition. Your body requires vitamins, minerals, and protein. Vegetables are the best source of vitamins and minerals. Vegetables also provide the perfect balance of protein. Processed food has little nutritional value, so try to avoid this. ° °Medications ° °Resume taking all of your medications. If your incision is causing pain, you may take over-the counter pain relievers such as acetaminophen (Tylenol). If you were prescribed a stronger pain medication, please be aware these medications can cause nausea and constipation. Prevent  nausea by taking the medication with a snack or meal. Avoid constipation by drinking plenty of fluids and eating foods with high amount of fiber, such as fruits, vegetables, and grains. Do not take Tylenol if you are taking prescription pain medications. ° ° ° ° °Follow up °Your surgeon may want to see you in the office following your access surgery. If so, this will be arranged at the time of your surgery. ° °Please call us immediately for any of the following conditions: ° °Increased pain, redness, drainage (pus) from your incision site °Fever of 101 degrees or higher °Severe or worsening pain at your incision site °Hand pain or numbness. ° °Reduce your risk of vascular disease: ° °Stop smoking. If you would like help, call [facility] at 1-800-QUIT-NOW (1-800-784-8669) or [facility] at 336-586-4000 ° °Manage your cholesterol °Maintain a desired weight °Control your diabetes °Keep your blood pressure down ° °Dialysis ° °It will take several weeks to several months for your new dialysis access to be ready for use. Your surgeon will determine when it is OK to use it. Your nephrologist will continue to direct your dialysis. You can continue to use your Permcath until your new access is ready for use. ° °If you have any questions, please call the office at [phone]. ° °

## 2023-03-07 NOTE — Plan of Care (Signed)
  Problem: Nutrition: Goal: Adequate nutrition will be maintained Outcome: Progressing   Problem: Pain Managment: Goal: General experience of comfort will improve Outcome: Progressing   

## 2023-03-07 NOTE — ED Notes (Signed)
This RN and provider removed dressing covering the dialysis fistula to reveal it was no longer bleeding and appeared to be clotted. The site was redressed with gauze and paper tape. Pt well appearing and tolerated procedure well.

## 2023-03-08 ENCOUNTER — Encounter (HOSPITAL_COMMUNITY): Payer: Self-pay | Admitting: Vascular Surgery

## 2023-03-08 DIAGNOSIS — I120 Hypertensive chronic kidney disease with stage 5 chronic kidney disease or end stage renal disease: Secondary | ICD-10-CM | POA: Diagnosis not present

## 2023-03-08 DIAGNOSIS — N185 Chronic kidney disease, stage 5: Secondary | ICD-10-CM

## 2023-03-08 DIAGNOSIS — T82838A Hemorrhage of vascular prosthetic devices, implants and grafts, initial encounter: Secondary | ICD-10-CM | POA: Diagnosis not present

## 2023-03-08 DIAGNOSIS — E6609 Other obesity due to excess calories: Secondary | ICD-10-CM

## 2023-03-08 DIAGNOSIS — Z992 Dependence on renal dialysis: Secondary | ICD-10-CM | POA: Diagnosis not present

## 2023-03-08 DIAGNOSIS — N186 End stage renal disease: Secondary | ICD-10-CM | POA: Diagnosis not present

## 2023-03-08 DIAGNOSIS — T82898A Other specified complication of vascular prosthetic devices, implants and grafts, initial encounter: Secondary | ICD-10-CM | POA: Diagnosis not present

## 2023-03-08 DIAGNOSIS — D631 Anemia in chronic kidney disease: Secondary | ICD-10-CM | POA: Diagnosis not present

## 2023-03-08 DIAGNOSIS — N250 Renal osteodystrophy: Secondary | ICD-10-CM | POA: Diagnosis not present

## 2023-03-08 LAB — HIV ANTIBODY (ROUTINE TESTING W REFLEX): HIV Screen 4th Generation wRfx: NONREACTIVE

## 2023-03-08 LAB — HEPATITIS B SURFACE ANTIBODY, QUANTITATIVE: Hep B S AB Quant (Post): 477 m[IU]/mL (ref >9.9)

## 2023-03-08 LAB — CBC
HCT: 32 % — ABNORMAL LOW (ref 36.0–46.0)
Hemoglobin: 10.4 g/dL — ABNORMAL LOW (ref 12.0–15.0)
MCH: 26.6 pg (ref 26.0–34.0)
MCHC: 32.5 g/dL (ref 30.0–36.0)
MCV: 81.8 fL (ref 80.0–100.0)
Platelets: 175 10*3/uL (ref 150–400)
RBC: 3.91 MIL/uL (ref 3.87–5.11)
RDW: 14.7 % (ref 11.5–15.5)
WBC: 7.4 10*3/uL (ref 4.0–10.5)
nRBC: 0 % (ref 0.0–0.2)

## 2023-03-08 LAB — RENAL FUNCTION PANEL
Albumin: 2.7 g/dL — ABNORMAL LOW (ref 3.5–5.0)
Anion gap: 13 (ref 5–15)
BUN: 76 mg/dL — ABNORMAL HIGH (ref 8–23)
CO2: 28 mmol/L (ref 22–32)
Calcium: 8.6 mg/dL — ABNORMAL LOW (ref 8.9–10.3)
Chloride: 98 mmol/L (ref 98–111)
Creatinine, Ser: 14.76 mg/dL — ABNORMAL HIGH (ref 0.44–1.00)
GFR, Estimated: 2 mL/min — ABNORMAL LOW (ref >60)
Glucose, Bld: 109 mg/dL — ABNORMAL HIGH (ref 70–99)
Phosphorus: 6.2 mg/dL — ABNORMAL HIGH (ref 2.5–4.6)
Potassium: 4.5 mmol/L (ref 3.5–5.1)
Sodium: 139 mmol/L (ref 135–145)

## 2023-03-08 MED ORDER — PHENOL 1.4 % MT LIQD
1.0000 | OROMUCOSAL | Status: DC | PRN
  Administered 2023-03-08: 1 via OROMUCOSAL
  Filled 2023-03-08: qty 177

## 2023-03-08 MED ORDER — PENTAFLUOROPROP-TETRAFLUOROETH EX AERO: 1.0000 | INHALATION_SPRAY | CUTANEOUS | Status: DC | PRN

## 2023-03-08 MED ORDER — LIDOCAINE HCL (PF) 1 % IJ SOLN: 5.0000 mL | INTRAMUSCULAR | Status: DC | PRN

## 2023-03-08 MED ORDER — HEPARIN SODIUM (PORCINE) 1000 UNIT/ML DIALYSIS
1000.0000 [IU] | INTRAMUSCULAR | Status: DC | PRN
  Administered 2023-03-08: 3200 [IU]
  Filled 2023-03-08: qty 1

## 2023-03-08 MED ORDER — ANTICOAGULANT SODIUM CITRATE 4% (200MG/5ML) IV SOLN: 5.0000 mL | Status: DC | PRN

## 2023-03-08 MED ORDER — LIDOCAINE-PRILOCAINE 2.5-2.5 % EX CREA: 1.0000 | TOPICAL_CREAM | CUTANEOUS | Status: DC | PRN

## 2023-03-08 MED ORDER — ALTEPLASE 2 MG IJ SOLR: 2.0000 mg | Freq: Once | INTRAMUSCULAR | Status: DC | PRN

## 2023-03-08 NOTE — Progress Notes (Signed)
Pt receives out-pt HD at [facility] on MWF. Case discussed with renal PA yesterday afternoon and advised pt will require 1st shift HD today. Will assist as needed.   Olivia Canter Renal Navigator [phone]

## 2023-03-08 NOTE — Progress Notes (Deleted)
Patient seen and examined on Hemodialysis. The procedure was supervised and I have made appropriate changes. BP (!) 88/62   Pulse 81   Temp 98 F (36.7 C)   Resp (!) 27   Ht 5\' 3"  (1.6 m)   Wt 81.3 kg   LMP  (LMP Unknown)   SpO2 100%   BMI 31.75 kg/m   QB 400 mL/ min via L fem TDC, UF goal 2L  Tolerating treatment without complaints at this time.   Bufford Buttner MD [facility] [phone] 12:15 PM

## 2023-03-08 NOTE — Op Note (Signed)
03/07/2023   12:46 PM   PATIENT:  Victoria [name]  67 y.o. female   PRE-OPERATIVE DIAGNOSIS:  END STAGE RENAL DISEASE   POST-OPERATIVE DIAGNOSIS:  END STAGE RENAL DISEASE   PROCEDURE:  Procedure(s): REVISON OF ARTERIOVENOUS FISTULA ARM (Right) UNLTRASOUND GUIDED INSERTION OF TUNNELED DIALYSIS CATHETER (Right)   SURGEON:  Surgeon(s) and Role:    * Leonie Douglas, MD - Primary   PHYSICIAN ASSISTANT: Aggie Moats   ANESTHESIA:   general   EBL:     BLOOD ADMINISTERED:none   DRAINS: none    LOCAL MEDICATIONS USED:  NONE   SPECIMEN:  No Specimen   DISPOSITION OF SPECIMEN:  N/A   COUNTS:  YES correct   TOURNIQUET: none   DICTATION: .Dragon Dictation   PLAN OF CARE: Admit for overnight observation   PATIENT DISPOSITION:  PACU - hemodynamically stable.   Delay start of Pharmacological VTE agent (>24hrs) due to surgical blood loss or risk of bleeding: no  INDICATION FOR PROCEDURE: Victoria [name] is a 67 y.o. female with bleeding from right arm arteriovenous graft. After careful discussion of risks, benefits, and alternatives the patient was offered revision of the graft and placement of tunneled dialysis catheter. The patient understood and wished to proceed.  OPERATIVE FINDINGS: replaced graft with new piece tunneled laterally. Excised partially exposed graft with hole that communicates with atmosphere because of potential infection risk. Likely central venous stenosis - not able to pass a wire centrally from either jugular vein. Placed left femoral TDC which worked well before leaving OR.  DESCRIPTION OF PROCEDURE: After identification of the patient in the pre-operative holding area, the patient was transferred to the operating room. The patient was positioned supine on the operating room table. Anesthesia was induced. The right arm and groins were prepped and draped in standard fashion. A surgical pause was performed confirming correct patient, procedure, and  operative location.  Small transverse incisions were used to expose the inflow and outflow the healthy segment of the right arm arteriovenous graft.  The graft was skeletonized and encircled.  The graft was very well incorporated.  A separate incision was made over the area of bleeding.  I noticed under loupe magnification that the graft communicated with the atmosphere.  Concerned that infection may result, I will remove the piece of graft between the 2 exposures through a longitudinal incision.  The graft was very well incorporated difficult to remove.  Ultimately all graft material was removed.  A new tunnel was made with a Kelly-Wick tunnel lateral to the existing graft position.  A 6 mm graft was then delivered through the sheath of the tunneling device.  The ends were cut to length and then sewn end-to-end to the inflow vessel with 6-0 Prolene in the outflow vessel with 6-0 Prolene.  Prior to completion the anastomoses were flushed and de-aired.  Clamps were released and the repair was evaluated.  Hemostasis was achieved.  Good flow was noted through the graft.  The wounds were irrigated and closed in layers using 3-0 Vicryl and a skin stapler.  Using ultrasound guidance both internal jugular veins were accessed with micropuncture technique.  Through the micropuncture sheath a floppy J-wire was advanced but could not pass into the superior vena cava.  I abandoned the jugular approach and elected to place a femoral catheter. Ultrasound guidance was used to access the left common femoral vein. A small incision was made around the skin access point.  The access point was serially  dilated under direct fluoroscopic guidance.  A peel-away sheath was introduced into the vein. A counterincision was made in the thigh.  A 55 cm tunnel dialysis catheter was then tunneled under the skin.  The tunneling device was removed and the catheter fed through the peel-away sheath into the superior vena cava.  The peel-away  sheath was removed and the catheter gently pulled back.  Adequate position was confirmed with x-ray.  The catheter was tested and found to flush and draw back well.  Catheter was heparin locked.  Caps were applied.  Catheter was sutured to the skin.  The neck incision was closed with 4-0 Monocryl.  Upon completion of the case instrument and sharps counts were confirmed correct. The patient was transferred to the  PACU in good condition. I was present for all portions of the procedure.  FOLLOW UP PLAN: Assuming a normal postoperative course, VVS PA will see the patient in 2 weeks to remove staples.  Rande Brunt. Lenell Antu, MD [facility] Phone Number: [phone] 03/08/2023 4:27 PM

## 2023-03-08 NOTE — TOC Transition Note (Signed)
Transition of Care [facility]) - CM/SW Discharge Note   Patient Details  Name: Victoria [name] MRN: [id number] Date of Birth: 1956-03-25  [facility]) CM/SW Contact:  Tom-Johnson, Hershal Coria, RN Phone Number: 03/08/2023, 2:33 PM   Clinical Narrative:     Patient is scheduled for discharge today.  Readmission Risk Assessment done. Outpatient f/u, hospital f/u and discharge instructions on AVS. No TOC needs or recommendations noted. Son, Jaynie Collins to transport at discharge.  No further TOC needs noted.        Final next level of care: Home/Self Care Barriers to Discharge: Barriers Resolved   Patient Goals and CMS Choice CMS Medicare.gov Compare Post Acute Care list provided to:: Patient Choice offered to / list presented to : NA  Discharge Placement                  Patient to be transferred to facility by: Son Name of family member notified: [place]    Discharge Plan and Services Additional resources added to the After Visit Summary for                  DME Arranged: N/A DME Agency: NA       HH Arranged: NA HH Agency: NA        Social Determinants of Health (SDOH) Interventions SDOH Screenings   Food Insecurity: No Food Insecurity (03/07/2023)  Housing: Low Risk  (03/07/2023)  Transportation Needs: No Transportation Needs (03/07/2023)  Utilities: Not At Risk (03/07/2023)  Alcohol Screen: Low Risk  (06/24/2020)  Depression (PHQ2-9): Low Risk  (01/14/2023)  Financial Resource Strain: Low Risk  (06/24/2020)  Physical Activity: Insufficiently Active (06/24/2020)  Social Connections: Socially Isolated (06/24/2020)  Stress: No Stress Concern Present (06/24/2020)  Tobacco Use: Low Risk  (03/07/2023)     Readmission Risk Interventions    03/08/2023    2:27 PM  Readmission Risk Prevention Plan  Transportation Screening Complete  PCP or Specialist Appt within 5-7 Days Complete  Home Care Screening Complete  Medication Review (RN CM) Referral to  Pharmacy

## 2023-03-08 NOTE — Progress Notes (Signed)
[facility] Progress Note   Assessment/ Plan:   Dialysis Orders:  [facility] MWF 3:30 400/A1.5x 2K/3Ca EDW 79 kg Heparin 5000 -No ESA  -Calcitriol TIW   Assessment/Plan: Prolonged bleeding from AV graft - s/p revision with interpositional jump graft with new L fem TDC placed per Dr. Lenell Antu. Rest for a month.  ESRD -  HD MWF. HD off schedule today and then back on tomorrow.  Hbg looks OK Hypertension/volume  - BP/volume acceptable. Continue home meds  Anemia  - Hgb above goal. No ESA needs currently. Follow trends post -op Metabolic bone disease -  Continue home binders/calcitriol  Nutrition - Renal diet with fluid restriction.   Subjective:    Seen on dialysis.  Says arm isn't hurting as much anymore.  Tolerating HD well.     Objective:   BP 96/68   Pulse 83   Temp 97.8 F (36.6 C)   Resp (!) 25   Ht 5\' 3"  (1.6 m)   Wt 81.3 kg   LMP  (LMP Unknown)   SpO2 100%   BMI 31.75 kg/m   Physical Exam: Gen:NAD, lying in bed CVS: RRR Resp: clear Abd: soft Ext: no LE edema ACCESS: R AVF with + interpositional jump graft and L fem TDC  Labs: BMET Recent Labs  Lab 03/07/23 0627 03/07/23 1109 03/08/23 0241  NA 137 139 139  K 4.5 4.5 4.5  CL 97* 104 98  CO2 24  --  28  GLUCOSE 86 108* 109*  BUN 66* 56* 76*  CREATININE 12.97* 13.20* 14.76*  CALCIUM 10.1  --  8.6*  PHOS  --   --  6.2*   CBC Recent Labs  Lab 03/07/23 0627 03/07/23 1109 03/08/23 0241  WBC 11.5*  --  7.4  HGB 13.4 12.2 10.4*  HCT 40.8 36.0 32.0*  MCV 82.6  --  81.8  PLT 215  --  175      Medications:     chlorhexidine gluconate (MEDLINE KIT)  15 mL Mouth/Throat BID   Chlorhexidine Gluconate Cloth  6 each Topical Q0600   ferric citrate  210 mg Oral TID WC   midodrine  10 mg Oral Q M,W,F-HD   sevelamer carbonate  4.8 g Oral TID WC   sodium chloride flush  3 mL Intravenous Q12H    Bufford Buttner MD 03/08/2023, 12:19 PM

## 2023-03-08 NOTE — Telephone Encounter (Signed)
Appt has been scheduled.

## 2023-03-08 NOTE — Discharge Summary (Signed)
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 [facility] Course: 67yo female with h/o ESRD on MWF HD who presented on 6/17 with AV fistula bleeding.  She underwent R AVG revision and TDC placement on 6/17 with plan to use the [facility] for at least 6 weeks.  She will need HD on 6/18, ?home after.  Assessment and Plan:   Bleeding from AV fistula -Patient presented with a 3-day history of bleeding from her right AV fistula graft which had initially been placed back in 03/2022 by Dr. Edilia Bo.   -Patient not on any blood thinners.   -Hemoglobin 13.4 and last available recorded hemoglobin 15.4 on 07/14/2022.   -Vascular surgery consulted, noted a ulcer present, and placed stitch with control of bleeding with plans to take to the operating room for revision of her graft. -Revision occurred yesterday afternoon -Observed on medical telemetry bed -TDC placed, will be used for at least 6 weeks   ESRD on HD -Patient on chronic MWF HD -Nephrology prn order set utilized -Dialyzed today, can resume home cycle tomorrow per nephrology -Continue Gean Quint, Sevelamer    Secondary hyperparathyroidism s/p total parathyroidectomy -She does not appear  to be taking medications for this issue at this time    Hypotension -Continue midodrine   Obesity -Body mass index is 31.75 kg/m..  -Weight loss should be encouraged -Outpatient PCP/bariatric medicine f/u encouraged     Consultants: Nephrology, vascular surgery, TOC team Procedures performed: R AV fistula revision  Disposition: Home Diet recommendation:  Renal diet DISCHARGE MEDICATION: Allergies as of 03/08/2023       Reactions   Amlodipine Swelling   Cefazolin    Pt received Ancef on 10/21.2021 without issue   Cephalosporins Anaphylaxis   Pt received Ancef on 07/10/2020 without issue   Lisinopril Swelling, Other (See Comments)   "made my chest hurt"        Medication List     TAKE these medications    Auryxia 1 GM 210 MG(Fe) tablet Generic drug: ferric citrate Take 210 mg by mouth See admin instructions. Take 1 tablet by mouth three times a day with meals and take 1 tablet twice per day with snacks.   chlorhexidine 0.12 % solution Commonly known as: PERIDEX Use as directed 15 mLs in the mouth or throat 2 (two) times daily.   midodrine 10 MG tablet Commonly known as: PROAMATINE Take 10 mg by mouth See admin instructions. Take 1 tablet by mouth three times a week as directed take one 30 minutes before starting dialysis and repeat x 1 halfway through tx.   Renvela 2.4 g Pack Generic drug: sevelamer carbonate Take 4.8 g by mouth 3 (three) times  daily with meals.   Sodium Fluoride 5000 Plus 1.1 % Crea dental cream Generic drug: sodium fluoride Place 1 Application onto teeth daily.   VITAMIN D PO Take 1 tablet by mouth See admin instructions. Monday, Wednesday, Friday        Follow-up Information     [facility] Follow up in 3 week(s).   Specialty: Vascular Surgery Contact information: [address] [place] [address] [phone]               Discharge Exam:  Subjective:  Reports  feeling well today s/p surgery, was seen in HD and desires to go home today.  Filed Weights   03/07/23 0949 03/08/23 0805 03/08/23 1218  Weight: 78 kg 81.3 kg 81.3 kg   Vitals:   03/08/23 1218 03/08/23 1242  BP: 96/68 (!) 88/57  Pulse: 83 82  Resp: (!) 25 18  Temp: 97.8 F (36.6 C) 98.2 F (36.8 C)  SpO2: 100% 99%     General:  Appears calm and comfortable and is in NAD Eyes:  PERRL, EOMI, normal lids, iris ENT:  grossly normal hearing, lips & tongue, mmm Neck:  no LAD, masses or thyromegaly Cardiovascular:  RRR, no m/r/g. No LE edema. R TDC is in place. Respiratory:   CTA bilaterally with no wheezes/rales/rhonchi.  Normal respiratory effort. Abdomen:  soft, NT, ND Skin:  no rash or induration seen on limited exam Musculoskeletal:  grossly normal tone BUE/BLE, good ROM, no bony abnormality.  RUE is bandaged, C/D/I. Psychiatric:  blunted mood and affect, speech fluent and appropriate, AOx3 Neurologic:  CN 2-12 grossly intact, moves all extremities in coordinated fashion   Radiological Exams on Admission: Independently reviewed - see discussion in A/P where applicable  DG CHEST PORT 1 VIEW  Result Date: 03/07/2023 CLINICAL DATA:  Dialysis catheter in place. EXAM: PORTABLE CHEST 1 VIEW COMPARISON:  Chest radiograph dated 05/26/2020. FINDINGS: Inferiorly accessed dialysis catheter with tip over central SVC. There is mild eventration of the left hemidiaphragm. Linear and platelike left mid to lower lung field atelectasis or scarring. No focal consolidation, pleural effusion or pneumothorax. Stable cardiomegaly. No acute osseous pathology. IMPRESSION: 1. Dialysis catheter with tip over central SVC. 2. Eventration of the left hemidiaphragm with left lung base atelectasis/scarring. Infiltrate is not excluded. Electronically Signed   By: Elgie Collard M.D.   On: 03/07/2023 17:35   DG C-Arm 1-60 Min-No Report  Result Date: 03/07/2023 Fluoroscopy was utilized by the requesting  physician.  No radiographic interpretation.     Pertinent labs:     Glucose 109 BUN 76/Creatinine 14.76/GFR 2 Albumin 2.7 WBC 7.4 Hgb 10.4, down from 13.4   Condition at discharge: improving  The results of significant diagnostics from this hospitalization (including imaging, microbiology, ancillary and laboratory) are listed below for reference.   Imaging Studies: DG CHEST PORT 1 VIEW  Result Date: 03/07/2023 CLINICAL DATA:  Dialysis catheter in place. EXAM: PORTABLE CHEST 1 VIEW COMPARISON:  Chest radiograph dated 05/26/2020. FINDINGS: Inferiorly accessed dialysis catheter with tip over central SVC. There is mild eventration of the left hemidiaphragm. Linear and platelike left mid to lower lung field atelectasis or scarring. No focal consolidation, pleural effusion or pneumothorax. Stable cardiomegaly. No acute osseous pathology. IMPRESSION: 1. Dialysis catheter with tip over central SVC. 2. Eventration of the left hemidiaphragm with left lung base atelectasis/scarring. Infiltrate is not excluded. Electronically Signed   By: Elgie Collard M.D.   On: 03/07/2023 17:35  DG C-Arm 1-60 Min-No Report  Result Date: 03/07/2023 Fluoroscopy was utilized by the requesting physician.  No radiographic interpretation.    Microbiology: Results for orders placed or performed during the hospital encounter of 03/07/23  MRSA Next Gen by PCR, Nasal     Status: None   Collection Time: 03/07/23  4:19 PM   Specimen: Nasal Mucosa; Nasal Swab  Result Value Ref Range Status   MRSA by PCR Next Gen NOT DETECTED NOT DETECTED Final    Comment: (NOTE) The GeneXpert MRSA Assay (FDA approved for NASAL specimens only), is one component of a comprehensive MRSA colonization surveillance program. It is not intended to diagnose MRSA infection nor to guide or monitor treatment for MRSA infections. Test performance is not FDA approved in patients less than 92 years old. Performed at [facility], [address], [place], [place] [address]      Discharge time spent: greater than 30 minutes.  Signed: Jonah Blue, MD [facility] 03/08/2023

## 2023-03-08 NOTE — Plan of Care (Signed)
  Problem: Activity: Goal: Risk for activity intolerance will decrease Outcome: Adequate for Discharge   Problem: Nutrition: Goal: Adequate nutrition will be maintained Outcome: Adequate for Discharge   Problem: Coping: Goal: Level of anxiety will decrease Outcome: Adequate for Discharge   Problem: Elimination: Goal: Will not experience complications related to bowel motility Outcome: Adequate for Discharge Goal: Will not experience complications related to urinary retention Outcome: Adequate for Discharge   Problem: Pain Managment: Goal: General experience of comfort will improve Outcome: Adequate for Discharge

## 2023-03-08 NOTE — Progress Notes (Signed)
DISCHARGE NOTE HOME MELISS HANAHAN to be discharged Home per MD order. Discussed prescriptions and follow up appointments with the patient. Prescriptions given to patient; medication list explained in detail. Patient verbalized understanding.  Skin clean, dry and intact without evidence of skin break down, no evidence of skin tears noted. IV catheter discontinued intact. Site without signs and symptoms of complications. Dressing and pressure applied. Pt denies pain at the site currently. No complaints noted.  Patient free of lines, drains, and wounds.   An After Visit Summary (AVS) was printed and given to the patient. Patient escorted via wheelchair, and discharged home via private auto.  Oda Cogan, LPN

## 2023-03-08 NOTE — Progress Notes (Signed)
   03/08/23 1218  Vitals  Temp 97.8 F (36.6 C)  Pulse Rate 83  Resp (!) 25  BP 96/68  SpO2 100 %  O2 Device Room Air  Weight 81.3 kg  Type of Weight Post-Dialysis  Oxygen Therapy  Pulse Oximetry Type Continuous  Oximetry Probe Site Changed No  Post Treatment  Dialyzer Clearance Lightly streaked  Duration of HD Treatment -hour(s) 3.3 hour(s)  Hemodialysis Intake (mL) 0 mL  Liters Processed 73.5  Fluid Removed (mL) 0 mL  Tolerated HD Treatment Yes  Post-Hemodialysis Comments decreased bps----dx is aware   Received patient in bed to unit.  Alert and oriented.  Informed consent signed and in chart.   TX duration:3.5  Patient tolerated well.  Transported back to the room  Alert, without acute distress.  Hand-off given to patient's nurse.   Access used: LFEMoral DLC Access issues: lines reversed---positional  Total UF removed: 0.1 Medication(s) given: none    [facility]

## 2023-03-08 NOTE — Procedures (Signed)
Patient seen and examined on Hemodialysis. The procedure was supervised and I have made appropriate changes. BP (!) 88/62   Pulse 81   Temp 98 F (36.7 C)   Resp (!) 24   Ht 5\' 3"  (1.6 m)   Wt 81.3 kg   LMP  (LMP Unknown)   SpO2 100%   BMI 31.75 kg/m   QB 400 mL/ min via L fem TDC, UF goal 2L  Tolerating treatment without complaints at this time.   Bufford Buttner MD [facility] [phone] 12:18 PM

## 2023-03-08 NOTE — Hospital Course (Signed)
67yo female with h/o ESRD on MWF HD who presented on 6/17 with AV fistula bleeding.  She underwent R AVG revision and TDC placement on 6/17 with plan to use the [facility] for at least 6 weeks.  She will need HD on 6/18, ?home after.

## 2023-03-08 NOTE — Progress Notes (Signed)
[facility] [place]  Subjective  -no complaint   Objective (!) 101/54 77 97.7 F (36.5 C) (Oral) 18 100%  Intake/Output Summary (Last 24 hours) at 03/08/2023 0646 Last data filed at 03/07/2023 2200 Gross per 24 hour  Intake 1090 ml  Output 50 ml  Net 1040 ml    Right arm AV graft revision site with staples with no hematoma and thrill in graft Left thigh tunneled dialysis catheter  Laboratory Lab Results: Recent Labs    03/07/23 0627 03/07/23 1109 03/08/23 0241  WBC 11.5*  --  7.4  HGB 13.4 12.2 10.4*  HCT 40.8 36.0 32.0*  PLT 215  --  175   BMET Recent Labs    03/07/23 0627 03/07/23 1109 03/08/23 0241  NA 137 139 139  K 4.5 4.5 4.5  CL 97* 104 98  CO2 24  --  28  GLUCOSE 86 108* 109*  BUN 66* 56* 76*  CREATININE 12.97* 13.20* 14.76*  CALCIUM 10.1  --  8.6*    COAG Lab Results  Component Value Date   INR 1.1 03/07/2023   INR 1.07 11/28/2015   INR 0.96 09/27/2012   No results found for: "PTT"  Assessment/Planning:  67 year old female with end-stage renal disease that presented yesterday with bleeding from her right arm AV graft with small ulceration.  She underwent revision with Dr. Lenell Antu requiring interposition jump graft.  I changed her dressing to the right arm and she has staples that will need to be removed in about 3 weeks in our office and discussed will arrange follow-up.  She needs to rest her right arm AV graft for about a month.  She can use the left thigh tunneled dialysis catheter in the interim.  Cephus Shelling 03/08/2023 6:46 AM --

## 2023-03-08 NOTE — Plan of Care (Signed)
[facility] Patient Discharge Orders- [facility] CLINIC: [facility]  Patient's name: Victoria [name] Admit/DC Dates: 03/07/2023 - 03/08/2023  Discharge Diagnoses: Bleeding/ulceration from R arm AV graft  s/p revision surgery   Aranesp: Given: --   Date and amount of last dose: --  Last Hgb: 10.4 PRBC's Given: -- Date/# of units: -- ESA dose for discharge: Mircera 30 mcg IV q 2 weeks  IV Iron dose at discharge: --  Heparin change: ---  EDW Change: No change  New EDW:   [place] Change: ---  Access intervention/Change: Yes  Details: Revision of AVG with interpositional jump graft. New L femoral TDC placed 03/07/23 by Dr. Lenell Antu   Hectorol/Calcitriol change: ---  Discharge Labs: Calcium 8.6 Phosphorus 6.2    Albumin 2.7       K+ 4.5  IV Antibiotics: --- Details:  On Coumadin?: -- Last INR: Next INR: Managed By:   OTHER/APPTS/LAB ORDERS: Per VVS to rest AVG for one month     D/C Meds to be reconciled by nurse after every discharge.  Completed By: Tomasa Blase PA-C  [facility] 03/08/2023,3:09 PM   Reviewed by: MD:______ RN_______

## 2023-03-09 DIAGNOSIS — T8249XA Other complication of vascular dialysis catheter, initial encounter: Secondary | ICD-10-CM | POA: Diagnosis not present

## 2023-03-09 DIAGNOSIS — Z992 Dependence on renal dialysis: Secondary | ICD-10-CM | POA: Diagnosis not present

## 2023-03-09 DIAGNOSIS — D688 Other specified coagulation defects: Secondary | ICD-10-CM | POA: Diagnosis not present

## 2023-03-09 DIAGNOSIS — D689 Coagulation defect, unspecified: Secondary | ICD-10-CM | POA: Diagnosis not present

## 2023-03-09 DIAGNOSIS — N2581 Secondary hyperparathyroidism of renal origin: Secondary | ICD-10-CM | POA: Diagnosis not present

## 2023-03-09 DIAGNOSIS — N186 End stage renal disease: Secondary | ICD-10-CM | POA: Diagnosis not present

## 2023-03-09 NOTE — Progress Notes (Signed)
Late Note Entry  Pt was d/c home yesterday afternoon. Contacted [facility] this morning to advise clinic of pt's d/c date and that pt should resume care today. Renal PA sent orders to clinic.   Olivia Canter Renal Navigator [phone]

## 2023-03-11 ENCOUNTER — Encounter (HOSPITAL_COMMUNITY): Payer: Self-pay | Admitting: Emergency Medicine

## 2023-03-11 ENCOUNTER — Other Ambulatory Visit: Payer: Self-pay

## 2023-03-11 ENCOUNTER — Emergency Department (HOSPITAL_COMMUNITY)
Admission: EM | Admit: 2023-03-11 | Discharge: 2023-03-12 | Disposition: A | Discharge status: Home / Self Care | Payer: 59 | Attending: Emergency Medicine | Admitting: Emergency Medicine

## 2023-03-11 DIAGNOSIS — I120 Hypertensive chronic kidney disease with stage 5 chronic kidney disease or end stage renal disease: Secondary | ICD-10-CM | POA: Insufficient documentation

## 2023-03-11 DIAGNOSIS — N186 End stage renal disease: Secondary | ICD-10-CM | POA: Insufficient documentation

## 2023-03-11 DIAGNOSIS — R58 Hemorrhage, not elsewhere classified: Secondary | ICD-10-CM | POA: Diagnosis not present

## 2023-03-11 DIAGNOSIS — N2581 Secondary hyperparathyroidism of renal origin: Secondary | ICD-10-CM | POA: Diagnosis not present

## 2023-03-11 DIAGNOSIS — T82838A Hemorrhage of vascular prosthetic devices, implants and grafts, initial encounter: Secondary | ICD-10-CM | POA: Diagnosis not present

## 2023-03-11 DIAGNOSIS — T8249XA Other complication of vascular dialysis catheter, initial encounter: Secondary | ICD-10-CM | POA: Diagnosis not present

## 2023-03-11 DIAGNOSIS — Z992 Dependence on renal dialysis: Secondary | ICD-10-CM | POA: Diagnosis not present

## 2023-03-11 DIAGNOSIS — D689 Coagulation defect, unspecified: Secondary | ICD-10-CM | POA: Diagnosis not present

## 2023-03-11 DIAGNOSIS — D688 Other specified coagulation defects: Secondary | ICD-10-CM | POA: Diagnosis not present

## 2023-03-11 NOTE — ED Triage Notes (Signed)
Pt has bleeding from her groin dialysis catheter x 10 minutes. Pt states that she recently had it placed on Tuesday.

## 2023-03-11 NOTE — ED Provider Notes (Signed)
Emergency Department Provider Note   I have reviewed the triage vital signs and the nursing notes.   HISTORY  Chief Complaint Vascular Access Problem   HPI Victoria [name] is a 67 y.o. female past history of end-stage renal disease presents to the emergency department with bleeding from her HD catheter in the left thigh.  This was placed by Dr. Lenell Antu on 6/18.  She was playing bingo when she suddenly felt wet on her leg and saw significant bleeding.  She held pressure and was driven to the emergency department.  She denies any lightheadedness, weakness, shortness of breath.  No chest pain. She is not on anticoagulation.   Past Medical History:  Diagnosis Date   Arthritis    Chronic bronchitis (HCC)    Complication of anesthesia ~ 2011   "they gave me a medicine that swolled me and mouth burning up" (08/23/2012)   Complication of anesthesia    slow to wake up   ESRD (end stage renal disease) on dialysis [facility]) Nephrologist-- dr deterding   ESRD due to HTN-; "M/W/F; [address]" (11/28/2015   GERD (gastroesophageal reflux disease)    no meds, diet controlled   History of acute pulmonary edema    2003   Hypertension    no longer on medications   Left patella fracture    Pneumonia    as a child   Seasonal allergies    Secondary hyperparathyroidism, renal (HCC)    s/p  total parathyroidectomy  2014   Thyroid disease    Wears glasses    Wound dehiscence, surgical 11/28/2015    Review of Systems  Constitutional: No fever/chills Eyes: No visual changes. ENT: No sore throat. Cardiovascular: Denies chest pain. Positive bleeding from left leg HD cath.  Respiratory: Denies shortness of breath. Gastrointestinal: No abdominal pain.    ____________________________________________   PHYSICAL EXAM:  VITAL SIGNS: ED Triage Vitals  Enc Vitals Group     BP 03/11/23 2327 108/74     Pulse Rate 03/11/23 2327 100     Resp 03/11/23 2327 18     Temp 03/11/23 2327 98 F (36.7 C)      Temp src --      SpO2 03/11/23 2327 97 %   Constitutional: Alert and oriented. Well appearing and in no acute distress. Eyes: Conjunctivae are normal.  Head: Atraumatic. Nose: No congestion/rhinnorhea. Mouth/Throat: Mucous membranes are moist.  Neck: No stridor.  Cardiovascular: Normal rate, regular rhythm. Good peripheral circulation. Grossly normal heart sounds.  HD cath in the left upper leg. Suture and dressing in place with clot. Clot removed with mild oozing at this time from the insertion site. Line does not appear dislodged.  Respiratory: Normal respiratory effort.  No retractions. Lungs CTAB. Gastrointestinal: No distention.  Musculoskeletal: No gross deformities of extremities. Neurologic:  Normal speech and language.  Skin:  Skin is warm, dry and intact. No rash noted.   ____________________________________________   LABS (all labs ordered are listed, but only abnormal results are displayed)  Labs Reviewed  BASIC METABOLIC PANEL - Abnormal; Notable for the following components:      Result Value   Chloride 97 (*)    Glucose, Bld 109 (*)    BUN 30 (*)    Creatinine, Ser 7.84 (*)    GFR, Estimated 5 (*)    Anion gap 16 (*)    All other components within normal limits  CBC WITH DIFFERENTIAL/PLATELET - Abnormal; Notable for the following components:   Eosinophils  Absolute 0.9 (*)    All other components within normal limits  PROTIME-INR   ____________________________________________   PROCEDURES  Procedure(s) performed:   Procedures  None  ____________________________________________   INITIAL IMPRESSION / ASSESSMENT AND PLAN / ED COURSE  Pertinent labs & imaging results that were available during my care of the patient were reviewed by me and considered in my medical decision making (see chart for details).   This patient is Presenting for Evaluation of HD cath site bleeding, which does require a range of treatment options, and is a complaint that  involves a high risk of morbidity and mortality.  The Differential Diagnoses include line complication, thrombocytopenia, anemia, anticoagulation, etc.  I decided to review pertinent External Data, and in summary HD cath placed by vascular surgery on 03/08/23.   Clinical Laboratory Tests Ordered, included creatinine 7.84. No anemia or thrombocytopenia.   Cardiac Monitor Tracing which shows NSR.    Social Determinants of Health Risk patient is a non-smoker.   Medical Decision Making: Summary:  Upon arrival the patient had fairly heavy bleeding in triage.  Coban pressure dressing was placed prior to my evaluation.  I left this in place for 30 minutes and then explore the area.  I do not see any displacement of the line.  No large hematoma.  No pulsatile bleeding.  Minimal oozing blood on my assessment.  I removed the superficial clot and placed a combat gauze dressing.  Plan for ED observation.   Reevaluation with update and discussion with patient. No re-bleeding after dressing change/pressure and ED observation. Plan for d/c home at this time. Advised close PCP follow up. Ok to use HD cath as it does not appear displaced.   Patient's presentation is most consistent with acute presentation with potential threat to life or bodily function.   Disposition: discharge  ____________________________________________  FINAL CLINICAL IMPRESSION(S) / ED DIAGNOSES  Final diagnoses:  Bleeding    Note:  This document was prepared using Dragon voice recognition software and may include unintentional dictation errors.  Alona Bene, MD, [facility]    Gurtha Picker, Arlyss Repress, MD 03/12/23 [phone]

## 2023-03-12 LAB — BASIC METABOLIC PANEL
Anion gap: 16 — ABNORMAL HIGH (ref 5–15)
BUN: 30 mg/dL — ABNORMAL HIGH (ref 8–23)
CO2: 26 mmol/L (ref 22–32)
Calcium: 9.6 mg/dL (ref 8.9–10.3)
Chloride: 97 mmol/L — ABNORMAL LOW (ref 98–111)
Creatinine, Ser: 7.84 mg/dL — ABNORMAL HIGH (ref 0.44–1.00)
GFR, Estimated: 5 mL/min — ABNORMAL LOW (ref >60)
Glucose, Bld: 109 mg/dL — ABNORMAL HIGH (ref 70–99)
Potassium: 3.8 mmol/L (ref 3.5–5.1)
Sodium: 139 mmol/L (ref 135–145)

## 2023-03-12 LAB — CBC WITH DIFFERENTIAL/PLATELET
Abs Immature Granulocytes: 0.05 10*3/uL (ref 0.00–0.07)
Basophils Absolute: 0.1 10*3/uL (ref 0.0–0.1)
Basophils Relative: 1 %
Eosinophils Absolute: 0.9 10*3/uL — ABNORMAL HIGH (ref 0.0–0.5)
Eosinophils Relative: 9 %
HCT: 39.4 % (ref 36.0–46.0)
Hemoglobin: 12.8 g/dL (ref 12.0–15.0)
Immature Granulocytes: 1 %
Lymphocytes Relative: 14 %
Lymphs Abs: 1.4 10*3/uL (ref 0.7–4.0)
MCH: 27 pg (ref 26.0–34.0)
MCHC: 32.5 g/dL (ref 30.0–36.0)
MCV: 83.1 fL (ref 80.0–100.0)
Monocytes Absolute: 0.9 10*3/uL (ref 0.1–1.0)
Monocytes Relative: 8 %
Neutro Abs: 6.9 10*3/uL (ref 1.7–7.7)
Neutrophils Relative %: 67 %
Platelets: 210 10*3/uL (ref 150–400)
RBC: 4.74 MIL/uL (ref 3.87–5.11)
RDW: 15.1 % (ref 11.5–15.5)
WBC: 10.3 10*3/uL (ref 4.0–10.5)
nRBC: 0 % (ref 0.0–0.2)

## 2023-03-12 LAB — PROTIME-INR
INR: 1 (ref 0.8–1.2)
Prothrombin Time: 13.6 seconds (ref 11.4–15.2)

## 2023-03-12 NOTE — Discharge Instructions (Signed)
You were seen in the ED today with bleeding from your dialysis catheter. You should call the vascular surgery doctor who placed the line for an office follow up in the coming week. You may continue to use the catheter for dialysis. Please return with any new or worsening symptoms.

## 2023-03-14 ENCOUNTER — Telehealth: Payer: Self-pay

## 2023-03-14 DIAGNOSIS — N186 End stage renal disease: Secondary | ICD-10-CM | POA: Diagnosis not present

## 2023-03-14 DIAGNOSIS — Z992 Dependence on renal dialysis: Secondary | ICD-10-CM | POA: Diagnosis not present

## 2023-03-14 DIAGNOSIS — N2581 Secondary hyperparathyroidism of renal origin: Secondary | ICD-10-CM | POA: Diagnosis not present

## 2023-03-14 DIAGNOSIS — D688 Other specified coagulation defects: Secondary | ICD-10-CM | POA: Diagnosis not present

## 2023-03-14 DIAGNOSIS — D689 Coagulation defect, unspecified: Secondary | ICD-10-CM | POA: Diagnosis not present

## 2023-03-14 DIAGNOSIS — T8249XA Other complication of vascular dialysis catheter, initial encounter: Secondary | ICD-10-CM | POA: Diagnosis not present

## 2023-03-14 NOTE — Transitions of Care (Post Inpatient/ED Visit) (Signed)
   03/14/2023  Name: Victoria [name] MRN: [id number] DOB: June 06, 1956  Today's TOC FU Call Status: Today's TOC FU Call Status:: Successful TOC FU Call Competed TOC FU Call Complete Date: 03/14/23  Transition Care Management Follow-up Telephone Call Date of Discharge: 03/12/23 Discharge Facility: [facility]) Type of Discharge: Emergency Department How have you been since you were released from the hospital?: Better Any questions or concerns?: No  Items Reviewed: Did you receive and understand the discharge instructions provided?: Yes Medications obtained,verified, and reconciled?: Partial Review Completed Any new allergies since your discharge?: No Dietary orders reviewed?: NA Do you have support at home?: Yes  Medications Reviewed Today: Medications Reviewed Today     Reviewed by Bertram Millard, RN (Registered Nurse) on 03/07/23 at 1000  Med List Status: Complete   Medication Order Taking? Sig Documenting Provider Last Dose Status Informant  AURYXIA 1 GM 210 MG(Fe) tablet [id number] Yes Take 210 mg by mouth See admin instructions. Take 1 tablet by mouth three times a day with meals and take 1 tablet twice per day with snacks. [provider] 03/06/2023 Active Self, Pharmacy Records  chlorhexidine (PERIDEX) 0.12 % solution [id number] Yes Use as directed 15 mLs in the mouth or throat 2 (two) times daily. [provider] 03/06/2023 Active Self, Pharmacy Records  midodrine (PROAMATINE) 10 MG tablet [id number] Yes Take 10 mg by mouth See admin instructions. Take 1 tablet by mouth three times a week as directed take one 30 minutes before starting dialysis and repeat x 1 halfway through tx. [provider] 03/07/2023 Active Self, Pharmacy Records  sevelamer carbonate (RENVELA) 2.4 g PACK [id number] Yes Take 4.8 g by mouth 3 (three) times daily with meals. [provider] 03/06/2023 Active Self, Pharmacy Records  SODIUM FLUORIDE 5000 PLUS 1.1 % CREA dental cream  [id number] Yes Place 1 Application onto teeth daily. [provider] 03/06/2023 Active Self, Pharmacy Records  VITAMIN D PO [id number] Yes Take 1 tablet by mouth See admin instructions. Monday, Wednesday, Friday [provider] 03/04/2023 Active Self, Pharmacy Records            Home Care and Equipment/Supplies: Were Home Health Services Ordered?: NA Any new equipment or medical supplies ordered?: NA  Functional Questionnaire: Do you need assistance with bathing/showering or dressing?: No Do you need assistance with meal preparation?: No Do you need assistance with eating?: No Do you have difficulty maintaining continence: No Do you need assistance with getting out of bed/getting out of a chair/moving?: No Do you have difficulty managing or taking your medications?: No  Follow up appointments reviewed: PCP Follow-up appointment confirmed?: No Specialist Hospital Follow-up appointment confirmed?: No Do you need transportation to your follow-up appointment?: No Do you understand care options if your condition(s) worsen?: Yes-patient verbalized understanding    SIGNATURE Arvil Persons, BSN, RN

## 2023-03-16 DIAGNOSIS — N2581 Secondary hyperparathyroidism of renal origin: Secondary | ICD-10-CM | POA: Diagnosis not present

## 2023-03-16 DIAGNOSIS — N186 End stage renal disease: Secondary | ICD-10-CM | POA: Diagnosis not present

## 2023-03-16 DIAGNOSIS — D688 Other specified coagulation defects: Secondary | ICD-10-CM | POA: Diagnosis not present

## 2023-03-16 DIAGNOSIS — T8249XA Other complication of vascular dialysis catheter, initial encounter: Secondary | ICD-10-CM | POA: Diagnosis not present

## 2023-03-16 DIAGNOSIS — Z992 Dependence on renal dialysis: Secondary | ICD-10-CM | POA: Diagnosis not present

## 2023-03-16 DIAGNOSIS — D689 Coagulation defect, unspecified: Secondary | ICD-10-CM | POA: Diagnosis not present

## 2023-03-18 DIAGNOSIS — D689 Coagulation defect, unspecified: Secondary | ICD-10-CM | POA: Diagnosis not present

## 2023-03-18 DIAGNOSIS — N186 End stage renal disease: Secondary | ICD-10-CM | POA: Diagnosis not present

## 2023-03-18 DIAGNOSIS — N2581 Secondary hyperparathyroidism of renal origin: Secondary | ICD-10-CM | POA: Diagnosis not present

## 2023-03-18 DIAGNOSIS — T8249XA Other complication of vascular dialysis catheter, initial encounter: Secondary | ICD-10-CM | POA: Diagnosis not present

## 2023-03-18 DIAGNOSIS — D688 Other specified coagulation defects: Secondary | ICD-10-CM | POA: Diagnosis not present

## 2023-03-18 DIAGNOSIS — Z992 Dependence on renal dialysis: Secondary | ICD-10-CM | POA: Diagnosis not present

## 2023-03-20 DIAGNOSIS — N186 End stage renal disease: Secondary | ICD-10-CM | POA: Diagnosis not present

## 2023-03-20 DIAGNOSIS — I120 Hypertensive chronic kidney disease with stage 5 chronic kidney disease or end stage renal disease: Secondary | ICD-10-CM | POA: Diagnosis not present

## 2023-03-20 DIAGNOSIS — Z992 Dependence on renal dialysis: Secondary | ICD-10-CM | POA: Diagnosis not present

## 2023-03-21 DIAGNOSIS — D688 Other specified coagulation defects: Secondary | ICD-10-CM | POA: Diagnosis not present

## 2023-03-21 DIAGNOSIS — N2581 Secondary hyperparathyroidism of renal origin: Secondary | ICD-10-CM | POA: Diagnosis not present

## 2023-03-21 DIAGNOSIS — N186 End stage renal disease: Secondary | ICD-10-CM | POA: Diagnosis not present

## 2023-03-21 DIAGNOSIS — Z992 Dependence on renal dialysis: Secondary | ICD-10-CM | POA: Diagnosis not present

## 2023-03-21 DIAGNOSIS — D689 Coagulation defect, unspecified: Secondary | ICD-10-CM | POA: Diagnosis not present

## 2023-03-21 DIAGNOSIS — R52 Pain, unspecified: Secondary | ICD-10-CM | POA: Diagnosis not present

## 2023-03-21 DIAGNOSIS — T8249XA Other complication of vascular dialysis catheter, initial encounter: Secondary | ICD-10-CM | POA: Diagnosis not present

## 2023-03-23 DIAGNOSIS — T8249XA Other complication of vascular dialysis catheter, initial encounter: Secondary | ICD-10-CM | POA: Diagnosis not present

## 2023-03-23 DIAGNOSIS — N186 End stage renal disease: Secondary | ICD-10-CM | POA: Diagnosis not present

## 2023-03-23 DIAGNOSIS — D689 Coagulation defect, unspecified: Secondary | ICD-10-CM | POA: Diagnosis not present

## 2023-03-23 DIAGNOSIS — N2581 Secondary hyperparathyroidism of renal origin: Secondary | ICD-10-CM | POA: Diagnosis not present

## 2023-03-23 DIAGNOSIS — Z992 Dependence on renal dialysis: Secondary | ICD-10-CM | POA: Diagnosis not present

## 2023-03-23 DIAGNOSIS — R52 Pain, unspecified: Secondary | ICD-10-CM | POA: Diagnosis not present

## 2023-03-23 DIAGNOSIS — D688 Other specified coagulation defects: Secondary | ICD-10-CM | POA: Diagnosis not present

## 2023-03-25 DIAGNOSIS — N186 End stage renal disease: Secondary | ICD-10-CM | POA: Diagnosis not present

## 2023-03-25 DIAGNOSIS — D688 Other specified coagulation defects: Secondary | ICD-10-CM | POA: Diagnosis not present

## 2023-03-25 DIAGNOSIS — D689 Coagulation defect, unspecified: Secondary | ICD-10-CM | POA: Diagnosis not present

## 2023-03-25 DIAGNOSIS — R52 Pain, unspecified: Secondary | ICD-10-CM | POA: Diagnosis not present

## 2023-03-25 DIAGNOSIS — Z992 Dependence on renal dialysis: Secondary | ICD-10-CM | POA: Diagnosis not present

## 2023-03-25 DIAGNOSIS — T8249XA Other complication of vascular dialysis catheter, initial encounter: Secondary | ICD-10-CM | POA: Diagnosis not present

## 2023-03-25 DIAGNOSIS — N2581 Secondary hyperparathyroidism of renal origin: Secondary | ICD-10-CM | POA: Diagnosis not present

## 2023-03-28 DIAGNOSIS — N2581 Secondary hyperparathyroidism of renal origin: Secondary | ICD-10-CM | POA: Diagnosis not present

## 2023-03-28 DIAGNOSIS — N186 End stage renal disease: Secondary | ICD-10-CM | POA: Diagnosis not present

## 2023-03-28 DIAGNOSIS — R52 Pain, unspecified: Secondary | ICD-10-CM | POA: Diagnosis not present

## 2023-03-28 DIAGNOSIS — D688 Other specified coagulation defects: Secondary | ICD-10-CM | POA: Diagnosis not present

## 2023-03-28 DIAGNOSIS — T8249XA Other complication of vascular dialysis catheter, initial encounter: Secondary | ICD-10-CM | POA: Diagnosis not present

## 2023-03-28 DIAGNOSIS — D689 Coagulation defect, unspecified: Secondary | ICD-10-CM | POA: Diagnosis not present

## 2023-03-28 DIAGNOSIS — Z992 Dependence on renal dialysis: Secondary | ICD-10-CM | POA: Diagnosis not present

## 2023-03-29 ENCOUNTER — Ambulatory Visit (INDEPENDENT_AMBULATORY_CARE_PROVIDER_SITE_OTHER): Payer: 59 | Admitting: Physician Assistant

## 2023-03-29 VITALS — BP 125/65 | HR 82 | Temp 96.8°F | Resp 18 | Ht 64.0 in | Wt 172.2 lb

## 2023-03-29 DIAGNOSIS — N186 End stage renal disease: Secondary | ICD-10-CM

## 2023-03-29 DIAGNOSIS — Z992 Dependence on renal dialysis: Secondary | ICD-10-CM

## 2023-03-29 NOTE — Progress Notes (Signed)
  POST OPERATIVE OFFICE NOTE    CC:  F/u for surgery  HPI:  This is a 67 y.o. female who is s/p REVISON OF ARTERIOVENOUS FISTULA ARM (Right) UNLTRASOUND GUIDED INSERTION OF TUNNELED DIALYSIS CATHETER (Right)  on 03/07/23 by Dr. Lenell Antu.    Pt returns today for follow up.  Pt states she has no symptoms of steal.  She uses the left femoral TDC for HD on MWF.    Allergies  Allergen Reactions   Amlodipine Swelling   Cefazolin     Pt received Ancef on 10/21.2021 without issue   Cephalosporins Anaphylaxis    Pt received Ancef on 07/10/2020 without issue   Lisinopril Swelling and Other (See Comments)    "made my chest hurt"    Current Outpatient Medications  Medication Sig Dispense Refill   AURYXIA 1 GM 210 MG(Fe) tablet Take 210 mg by mouth See admin instructions. Take 1 tablet by mouth three times a day with meals and take 1 tablet twice per day with snacks.     chlorhexidine (PERIDEX) 0.12 % solution Use as directed 15 mLs in the mouth or throat 2 (two) times daily.     midodrine (PROAMATINE) 10 MG tablet Take 10 mg by mouth See admin instructions. Take 1 tablet by mouth three times a week as directed take one 30 minutes before starting dialysis and repeat x 1 halfway through tx.     sevelamer carbonate (RENVELA) 2.4 g PACK Take 4.8 g by mouth 3 (three) times daily with meals. (Patient not taking: Reported on 03/29/2023)     SODIUM FLUORIDE 5000 PLUS 1.1 % CREA dental cream Place 1 Application onto teeth daily. (Patient not taking: Reported on 03/29/2023)     VITAMIN D PO Take 1 tablet by mouth See admin instructions. Monday, Wednesday, Friday     No current facility-administered medications for this visit.     ROS:  See HPI  Physical Exam:    Incision:  Staples removed from right UE incision Extremities:  palpable radial pulse, audible thrill Staples removed patient tolerated this well     Assessment/Plan:  This is a 67 y.o. female who is s/p:REVISON OF ARTERIOVENOUS FISTULA  ARM (Right) UNLTRASOUND GUIDED INSERTION OF TUNNELED DIALYSIS CATHETER (Right)  The incision needs to be fully healed prior to using the right AV graft.  Once the graft is functioning well the left femoral TDC may be removed.  I would wait at leats 3-4 weeks before using the graft.  F/U PRN.   Mosetta Pigeon PA-C [facility] [phone]   Clinic MD:  Lenell Antu

## 2023-03-30 DIAGNOSIS — Z992 Dependence on renal dialysis: Secondary | ICD-10-CM | POA: Diagnosis not present

## 2023-03-30 DIAGNOSIS — T8249XA Other complication of vascular dialysis catheter, initial encounter: Secondary | ICD-10-CM | POA: Diagnosis not present

## 2023-03-30 DIAGNOSIS — N186 End stage renal disease: Secondary | ICD-10-CM | POA: Diagnosis not present

## 2023-03-30 DIAGNOSIS — D688 Other specified coagulation defects: Secondary | ICD-10-CM | POA: Diagnosis not present

## 2023-03-30 DIAGNOSIS — N2581 Secondary hyperparathyroidism of renal origin: Secondary | ICD-10-CM | POA: Diagnosis not present

## 2023-03-30 DIAGNOSIS — R52 Pain, unspecified: Secondary | ICD-10-CM | POA: Diagnosis not present

## 2023-03-30 DIAGNOSIS — D689 Coagulation defect, unspecified: Secondary | ICD-10-CM | POA: Diagnosis not present

## 2023-03-31 ENCOUNTER — Ambulatory Visit (HOSPITAL_COMMUNITY)
Admission: EM | Admit: 2023-03-31 | Discharge: 2023-03-31 | Disposition: A | Discharge status: Home / Self Care | Payer: 59 | Attending: Emergency Medicine | Admitting: Emergency Medicine

## 2023-03-31 ENCOUNTER — Ambulatory Visit (INDEPENDENT_AMBULATORY_CARE_PROVIDER_SITE_OTHER): Payer: 59

## 2023-03-31 ENCOUNTER — Encounter (HOSPITAL_COMMUNITY): Payer: Self-pay | Admitting: *Deleted

## 2023-03-31 DIAGNOSIS — R058 Other specified cough: Secondary | ICD-10-CM | POA: Diagnosis not present

## 2023-03-31 DIAGNOSIS — J069 Acute upper respiratory infection, unspecified: Secondary | ICD-10-CM | POA: Diagnosis not present

## 2023-03-31 DIAGNOSIS — J029 Acute pharyngitis, unspecified: Secondary | ICD-10-CM | POA: Diagnosis not present

## 2023-03-31 DIAGNOSIS — R0989 Other specified symptoms and signs involving the circulatory and respiratory systems: Secondary | ICD-10-CM | POA: Diagnosis not present

## 2023-03-31 DIAGNOSIS — Z452 Encounter for adjustment and management of vascular access device: Secondary | ICD-10-CM | POA: Diagnosis not present

## 2023-03-31 MED ORDER — PREDNISONE 20 MG PO TABS: 40.0000 mg | ORAL_TABLET | Freq: Every day | ORAL | 0 refills | Status: DC

## 2023-03-31 MED ORDER — AZITHROMYCIN 250 MG PO TABS: 250.0000 mg | ORAL_TABLET | Freq: Every day | ORAL | 0 refills | Status: DC

## 2023-03-31 MED ORDER — BENZONATATE 100 MG PO CAPS: 100.0000 mg | ORAL_CAPSULE | Freq: Three times a day (TID) | ORAL | 0 refills | Status: DC

## 2023-03-31 NOTE — Discharge Instructions (Addendum)
Chest x-ray is negative for pneumonia or bronchitis  Take azithromycin provide coverage for bacteria which may be causing symptoms to prolong  Begin use of prednisone every morning with food to open and relax the airway ideally this will calm your coughing  You may use Tessalon pill every 8 hours as needed to further help calm coughing  You can take Tylenol  as needed for fever reduction and pain relief.   For cough: honey 1/2 to 1 teaspoon (you can dilute the honey in water or another fluid).  You can also use guaifenesin and dextromethorphan for cough. You can use a humidifier for chest congestion and cough.  If you don't have a humidifier, you can sit in the bathroom with the hot shower running.      For sore throat: try warm salt water gargles, cepacol lozenges, throat spray, warm tea or water with lemon/honey, popsicles or ice, or OTC cold relief medicine for throat discomfort.   For congestion: take a daily anti-histamine like Zyrtec, Claritin, and a oral decongestant, such as pseudoephedrine.  You can also use Flonase 1-2 sprays in each nostril daily.   It is important to stay hydrated: drink plenty of fluids (water, gatorade/powerade/pedialyte, juices, or teas) to keep your throat moisturized and help further relieve irritation/discomfort.

## 2023-03-31 NOTE — ED Triage Notes (Signed)
Pt states she has had a productive cough, sore throat and congestion x 1 month. She hasn't taken any meds for her sx.

## 2023-03-31 NOTE — ED Provider Notes (Signed)
[facility]    CSN: [id number] Arrival date & time: 03/31/23  2130      History   Chief Complaint Chief Complaint  Patient presents with   Cough   Sore Throat    HPI Victoria [name] is a 67 y.o. female.   Patient presents for evaluation of a productive cough with thick sputum and chest congestion present for 1 month.  Experiencing a scratchy sore throat that has intermittently caused vomiting and becoming irritated.  Has had some shortness of breath and wheezing only experience during episodes of coughing.  No known sick contacts prior.  Smell of smoke exacerbate symptoms.  Denies fever chills body aches, ear pain or sore throat.  Tolerating food and liquids.  Has not attempted treatment.  History of chronic bronchitis.  Dialysis patient, denies missing treatment.   Past Medical History:  Diagnosis Date   Arthritis    Chronic bronchitis (HCC)    Complication of anesthesia ~ 2011   "they gave me a medicine that swolled me and mouth burning up" (08/23/2012)   Complication of anesthesia    slow to wake up   ESRD (end stage renal disease) on dialysis [facility]) Nephrologist-- dr deterding   ESRD due to HTN-; "M/W/F; [address]" (11/28/2015   GERD (gastroesophageal reflux disease)    no meds, diet controlled   History of acute pulmonary edema    2003   Hypertension    no longer on medications   Left patella fracture    Pneumonia    as a child   Seasonal allergies    Secondary hyperparathyroidism, renal (HCC)    s/p  total parathyroidectomy  2014   Thyroid disease    Wears glasses    Wound dehiscence, surgical 11/28/2015    Patient Active Problem List   Diagnosis Date Noted   Class 1 obesity due to excess calories with body mass index (BMI) of 31.0 to 31.9 in adult 03/08/2023   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) 03/07/2023   Leukocytosis 03/07/2023   Complication of vascular dialysis catheter 01/20/2022   ESRD on dialysis (HCC)  01/13/2022   S/P total parathyroidectomy 01/13/2022   Aortic atherosclerosis (HCC) 02/23/2021   Bilateral carpal tunnel syndrome 02/26/2019   Numbness 02/26/2019   Pain 02/26/2019   Trigger index finger of right hand 02/26/2019   Bilateral sensorineural hearing loss 01/16/2019   Subjective tinnitus, bilateral 01/16/2019   Deficiency of macronutrients 10/04/2013   Mechanical complication of other vascular device, implant, and graft 01/12/2013   Hyperlipidemia 10/25/2012   Thyroid nodule 03/22/2011   Hypotension of hemodialysis 03/10/2011   Anemia in chronic kidney disease 06/28/2001   Iron deficiency anemia 06/28/2001    Past Surgical History:  Procedure Laterality Date   ANKLE FRACTURE SURGERY Right ~ 2005   "has pins in it"   APPLICATION OF WOUND VAC Left 11/28/2015   knee   APPLICATION OF WOUND VAC Left 11/28/2015   Procedure: APPLICATION OF WOUND VAC;  Surgeon: Samson Frederic, MD;  Location: [facility] OR;  Service: Orthopedics;  Laterality: Left;   ARTERIOVENOUS GRAFT PLACEMENT  03/25/2005   Right forearm  w/  multiple Revision's    AV FISTULA PLACEMENT Right 04/13/2022   Procedure: INSERTION OF RIGHT ARTERIOVENOUS (AV) 4-30mm x 45cm GORE-TEX GRAFT;  Surgeon: Chuck Hint, MD;  Location[facility];  Service: Vascular;  Laterality: Right;   CARDIOVASCULAR STRESS TEST  08/24/2012   abnormal nuclear study/  inferolateral and anteroseptal areas of scar,  no ischemia/  normal LV function and wall motion, ef 77%   CATARACT EXTRACTION W/ INTRAOCULAR LENS IMPLANT Bilateral    cataract removal bilateral, lens implant in right eye   DIALYSIS FISTULA CREATION  09/20/1990   left upper arm ---  w/  Multiple Revision's until 2006   ECTOPIC PREGNANCY SURGERY  05/21/1969   FRACTURE SURGERY     HARDWARE REMOVAL Right 07/10/2020   Procedure: Removal of deep implant right fibula and tibia; steroid injection right ankle;  Surgeon: Toni Arthurs, MD;  Location: [facility];  Service: Orthopedics;   Laterality: Right;   I & D EXTREMITY Left 11/28/2015   Procedure: IRRIGATION AND DEBRIDEMENT LEFT KNEE WOUND ;  Surgeon: Samson Frederic, MD;  Location: [facility] OR;  Service: Orthopedics;  Laterality: Left;   INCISION AND DRAINAGE OF WOUND Left 11/28/2015   knee   INSERTION OF DIALYSIS CATHETER Right 03/07/2023   Procedure: Enis Gash GUIDED INSERTION OF TUNNELED DIALYSIS CATHETER;  Surgeon: Leonie Douglas, MD;  Location: [facility] OR;  Service: Vascular;  Laterality: Right;   IR FLUORO GUIDE CV LINE LEFT  02/09/2022   ORIF PATELLA Left 10/31/2015   Procedure: OPEN REDUCTION INTERNAL (ORIF) FIXATION LEFT PATELLA;  Surgeon: Samson Frederic, MD;  Location: [facility];  Service: Orthopedics;  Laterality: Left;   PATELLAR TENDON REPAIR  10/03/2012   Procedure: PATELLA TENDON REPAIR;  Surgeon: Senaida Lange, MD;  Location: [facility];  Service: Orthopedics;  Laterality: Right;   PATELLECTOMY  10/03/2012   Procedure: PATELLECTOMY;  Surgeon: Senaida Lange, MD;  Location: [facility];  Service: Orthopedics;  Laterality: Right;  RIGHT PARTIAL PATELLECTOMY AND PATELLA TENDON REPAIR   REVISION OF ARTERIOVENOUS GORETEX GRAFT  09/27/2012   Procedure: REVISION OF ARTERIOVENOUS GORETEX GRAFT;  Surgeon: Pryor Ochoa, MD;  Location: [facility] OR;  Service: Vascular;  Laterality: Right;  1) Replacement of venous half of loop with 6mm Gortex graft  2) Excision of erroded pseudoaneurysm of graft with primary closure.   REVISION OF ARTERIOVENOUS GORETEX GRAFT Right 01/23/2013   Procedure: REVISION OF ARTERIOVENOUS GORETEX GRAFT;  Surgeon: Fransisco Hertz, MD;  Location: [facility] OR;  Service: Vascular;  Laterality: Right;  Using piece of 6mm x 20cm Gortex graft.    REVISION OF ARTERIOVENOUS GORETEX GRAFT Right 10/07/2015   Procedure: REVISION OF Right arm ARTERIOVENOUS GORETEX GRAFT;  Surgeon: Sherren Kerns, MD;  Location: [facility] OR;  Service: Vascular;  Laterality: Right;   REVISION OF ARTERIOVENOUS GORETEX GRAFT Right 02/17/2016    Procedure: REVISION OF ARTERIOVENOUS GORETEX GRAFT;  Surgeon: Sherren Kerns, MD;  Location: [facility] OR;  Service: Vascular;  Laterality: Right;   REVISON OF ARTERIOVENOUS FISTULA Right 03/07/2023   Procedure: REVISON OF ARTERIOVENOUS FISTULA ARM;  Surgeon: Leonie Douglas, MD;  Location: [facility];  Service: Vascular;  Laterality: Right;   TOOTH EXTRACTION     10/2021   TOTAL ABDOMINAL HYSTERECTOMY  03/05/2005   w/  Right Ovarian Cystectomy   TOTAL PARATHYROIDECTOMY/  THYROID ISTHMUSECTOMY/  AUTOTRANSPLANTATION PARATHYROID TISSUE TO LEFT BRACHIORIADIALIS MUSCLE  02/18/2011   TRANSTHORACIC ECHOCARDIOGRAM  10/31/2012   mild LVH,  grade 1 diastolic dysfunction,  ef 55-65%/  mild MR/  trivial TR    OB History   No obstetric history on file.      Home Medications    Prior to Admission medications   Medication Sig Start Date End Date Taking? Authorizing Provider  AURYXIA 1 GM 210 MG(Fe) tablet Take 210 mg by mouth See admin  instructions. Take 1 tablet by mouth three times a day with meals and take 1 tablet twice per day with snacks. 12/10/22  Yes [provider]  midodrine (PROAMATINE) 10 MG tablet Take 10 mg by mouth See admin instructions. Take 1 tablet by mouth three times a week as directed take one 30 minutes before starting dialysis and repeat x 1 halfway through tx. 12/08/22  Yes [provider]  chlorhexidine (PERIDEX) 0.12 % solution Use as directed 15 mLs in the mouth or throat 2 (two) times daily. 02/12/23   [provider]  sevelamer carbonate (RENVELA) 2.4 g PACK Take 4.8 g by mouth 3 (three) times daily with meals. Patient not taking: Reported on 03/29/2023 04/07/22   [provider]  SODIUM FLUORIDE 5000 PLUS 1.1 % CREA dental cream Place 1 Application onto teeth daily. Patient not taking: Reported on 03/29/2023 02/12/23   [provider]  VITAMIN D PO Take 1 tablet by mouth See admin instructions. Monday, Wednesday, Friday 04/14/22 04/13/23  [provider]    Family History Family History  Problem Relation Age of Onset   Healthy Mother    Hypertension Father    Kidney failure Father    Hypertension Sister    Thyroid disease Sister     Social History Social History   Tobacco Use   Smoking status: Never    Passive exposure: Never   Smokeless tobacco: Never  Vaping Use   Vaping status: Never Used  Substance Use Topics   Alcohol use: Not Currently    Comment: occasionally drinks a few beers   Drug use: No     Allergies   Amlodipine, Cefazolin, Cephalosporins, and Lisinopril   Review of Systems Review of Systems  Constitutional: Negative.   HENT: Negative.    Respiratory:  Positive for cough. Negative for apnea, choking, chest tightness, shortness of breath, wheezing and stridor.   Cardiovascular: Negative.   Gastrointestinal: Negative.   Skin: Negative.      Physical Exam Triage Vital Signs ED Triage Vitals  Encounter Vitals Group     BP 03/31/23 1043 134/68     Systolic BP Percentile --      Diastolic BP Percentile --      Pulse Rate 03/31/23 1043 80     Resp 03/31/23 1043 20     Temp 03/31/23 1043 98.3 F (36.8 C)     Temp Source 03/31/23 1043 Oral     SpO2 03/31/23 1043 95 %     Weight --      Height --      Head Circumference --      Peak Flow --      Pain Score 03/31/23 1041 9     Pain Loc --      Pain Education --      Exclude from Growth Chart --    No data found.  Updated Vital Signs BP 134/68 (BP Location: Left Arm)   Pulse 80   Temp 98.3 F (36.8 C) (Oral)   Resp 20   LMP  (LMP Unknown)   SpO2 95%   Visual Acuity Right Eye Distance:   Left Eye Distance:   Bilateral Distance:    Right Eye Near:   Left Eye Near:    Bilateral Near:     Physical Exam Constitutional:      Appearance: She is well-developed.  Neurological:     General: No focal deficit present.     Mental Status: She is alert  and oriented to person, place, and time.      UC Treatments /  Results  Labs (all labs ordered are listed, but only abnormal results are displayed) Labs Reviewed - No data to display  EKG   Radiology No results found.  Procedures Procedures (including critical care time)  Medications Ordered in UC Medications - No data to display  Initial Impression / Assessment and Plan / UC Course  I have reviewed the triage vital signs and the nursing notes.  Pertinent labs & imaging results that were available during my care of the patient were reviewed by me and considered in my medical decision making (see chart for details).  Acute upper respiratory infection  Patient is in no signs of distress nor toxic appearing.  Vital signs are stable.  Chest x-ray negative for pneumonia or bronchitis, discussed with patient.  Due to timeline of illness prophylactically providing coverage, azithromycin sent to the pharmacy.  Prednisone prescribed to reduce harshness of cough as well as Tessalon for additional management.May use additional over-the-counter medications as needed for supportive care.  May follow-up with urgent care as needed if symptoms persist or worsen.  N Final Clinical Impressions(s) / UC Diagnoses   Final diagnoses:  None   Discharge Instructions   None    ED Prescriptions   None    PDMP not reviewed this encounter.   Valinda Hoar, NP 03/31/23 1500

## 2023-04-01 DIAGNOSIS — D689 Coagulation defect, unspecified: Secondary | ICD-10-CM | POA: Diagnosis not present

## 2023-04-01 DIAGNOSIS — T8249XA Other complication of vascular dialysis catheter, initial encounter: Secondary | ICD-10-CM | POA: Diagnosis not present

## 2023-04-01 DIAGNOSIS — N186 End stage renal disease: Secondary | ICD-10-CM | POA: Diagnosis not present

## 2023-04-01 DIAGNOSIS — N2581 Secondary hyperparathyroidism of renal origin: Secondary | ICD-10-CM | POA: Diagnosis not present

## 2023-04-01 DIAGNOSIS — R52 Pain, unspecified: Secondary | ICD-10-CM | POA: Diagnosis not present

## 2023-04-01 DIAGNOSIS — D688 Other specified coagulation defects: Secondary | ICD-10-CM | POA: Diagnosis not present

## 2023-04-01 DIAGNOSIS — Z992 Dependence on renal dialysis: Secondary | ICD-10-CM | POA: Diagnosis not present

## 2023-04-04 DIAGNOSIS — D688 Other specified coagulation defects: Secondary | ICD-10-CM | POA: Diagnosis not present

## 2023-04-04 DIAGNOSIS — D689 Coagulation defect, unspecified: Secondary | ICD-10-CM | POA: Diagnosis not present

## 2023-04-04 DIAGNOSIS — N186 End stage renal disease: Secondary | ICD-10-CM | POA: Diagnosis not present

## 2023-04-04 DIAGNOSIS — T8249XA Other complication of vascular dialysis catheter, initial encounter: Secondary | ICD-10-CM | POA: Diagnosis not present

## 2023-04-04 DIAGNOSIS — R52 Pain, unspecified: Secondary | ICD-10-CM | POA: Diagnosis not present

## 2023-04-04 DIAGNOSIS — N2581 Secondary hyperparathyroidism of renal origin: Secondary | ICD-10-CM | POA: Diagnosis not present

## 2023-04-04 DIAGNOSIS — Z992 Dependence on renal dialysis: Secondary | ICD-10-CM | POA: Diagnosis not present

## 2023-04-06 DIAGNOSIS — D689 Coagulation defect, unspecified: Secondary | ICD-10-CM | POA: Diagnosis not present

## 2023-04-06 DIAGNOSIS — T8249XA Other complication of vascular dialysis catheter, initial encounter: Secondary | ICD-10-CM | POA: Diagnosis not present

## 2023-04-06 DIAGNOSIS — R52 Pain, unspecified: Secondary | ICD-10-CM | POA: Diagnosis not present

## 2023-04-06 DIAGNOSIS — Z992 Dependence on renal dialysis: Secondary | ICD-10-CM | POA: Diagnosis not present

## 2023-04-06 DIAGNOSIS — N2581 Secondary hyperparathyroidism of renal origin: Secondary | ICD-10-CM | POA: Diagnosis not present

## 2023-04-06 DIAGNOSIS — N186 End stage renal disease: Secondary | ICD-10-CM | POA: Diagnosis not present

## 2023-04-06 DIAGNOSIS — D688 Other specified coagulation defects: Secondary | ICD-10-CM | POA: Diagnosis not present

## 2023-04-07 ENCOUNTER — Encounter: Payer: Self-pay | Admitting: Podiatry

## 2023-04-07 ENCOUNTER — Ambulatory Visit: Payer: 59 | Admitting: Podiatry

## 2023-04-07 VITALS — BP 96/57 | HR 70

## 2023-04-07 DIAGNOSIS — L600 Ingrowing nail: Secondary | ICD-10-CM | POA: Diagnosis not present

## 2023-04-07 NOTE — Progress Notes (Signed)
Subjective:  Patient ID: Victoria [name], female    DOB: 09-10-1956,  MRN: [id number]  Chief Complaint  Patient presents with   Nail Problem    "I have an ingrown toenail on my right big toe." N - painful toenail L - right hallux D - 1 month O - suddenly C - sore A - closed toe shoes T - soaked in Epsom Salt last week    67 y.o. female presents with concern for ingrown nail on medial border of right great toenail.  Patient has pain for approximately 1 month.  Says that wearing closed toed shoes and pressure on the area makes it worse.  Has tried Epsom salt soaks but did not fully relieve the pain.  Past Medical History:  Diagnosis Date   Arthritis    Chronic bronchitis (HCC)    Complication of anesthesia ~ 2011   "they gave me a medicine that swolled me and mouth burning up" (08/23/2012)   Complication of anesthesia    slow to wake up   ESRD (end stage renal disease) on dialysis [facility]) Nephrologist-- dr deterding   ESRD due to HTN-; "M/W/F; [address]" (11/28/2015   GERD (gastroesophageal reflux disease)    no meds, diet controlled   History of acute pulmonary edema    2003   Hypertension    no longer on medications   Left patella fracture    Pneumonia    as a child   Seasonal allergies    Secondary hyperparathyroidism, renal (HCC)    s/p  total parathyroidectomy  2014   Thyroid disease    Wears glasses    Wound dehiscence, surgical 11/28/2015    Allergies  Allergen Reactions   Amlodipine Swelling   Cefazolin     Pt received Ancef on 10/21.2021 without issue   Cephalosporins Anaphylaxis    Pt received Ancef on 07/10/2020 without issue   Lisinopril Swelling and Other (See Comments)    "made my chest hurt"    ROS: Negative except as per HPI above  Objective:  General: AAO x3, NAD  Dermatological: Incurvation is present along the medial nail border of the right great toe. There is localized edema without any erythema or increase in warmth around the nail border.  There is no drainage or pus. There is no ascending cellulitis. No malodor. No open lesions or pre-ulcerative lesions.    Vascular:  Dorsalis Pedis artery and Posterior Tibial artery pedal pulses are 2/4 bilateral.  Capillary fill time < 3 sec to all digits.   Neruologic: Grossly intact via light touch bilateral. Protective threshold intact to all sites bilateral.   Musculoskeletal: No gross boney pedal deformities bilateral. No pain, crepitus, or limitation noted with foot and ankle range of motion bilateral. Muscular strength 5/5 in all groups tested bilateral.  Gait: Unassisted, Nonantalgic.   No images are attached to the encounter.  Assessment:   1. Ingrown nail of great toe of right foot      Plan:  Patient was evaluated and treated and all questions answered.    Ingrown Nail, right -Patient elects to proceed with minor surgery to remove ingrown toenail today. Consent reviewed and signed by patient. -Ingrown nail excised. See procedure note. -Educated on post-procedure care including soaking. Written instructions provided and reviewed. -Patient to follow up in 2 weeks for nail check.  Procedure: Excision of Ingrown Toenail Location: Right 1st toe medial nail borders. Anesthesia: Lidocaine 1% plain; 1.5 mL and Marcaine 0.5% plain; 1.5 mL, digital block.  Skin Prep: Betadine. Dressing: Silvadene; telfa; dry, sterile, compression dressing. Technique: Following skin prep, the toe was exsanguinated and a tourniquet was secured at the base of the toe. The affected nail border was freed, split with a nail splitter, and excised. Chemical matrixectomy was then performed with phenol and irrigated out with alcohol. The tourniquet was then removed and sterile dressing applied. Disposition: Patient tolerated procedure well. Patient to return in 2 weeks for follow-up.    Return in about 2 weeks (around 04/21/2023) for nail check.          Corinna Gab, DPM [facility] / [facility]

## 2023-04-07 NOTE — Patient Instructions (Signed)

## 2023-04-08 DIAGNOSIS — Z992 Dependence on renal dialysis: Secondary | ICD-10-CM | POA: Diagnosis not present

## 2023-04-08 DIAGNOSIS — T8249XA Other complication of vascular dialysis catheter, initial encounter: Secondary | ICD-10-CM | POA: Diagnosis not present

## 2023-04-08 DIAGNOSIS — N2581 Secondary hyperparathyroidism of renal origin: Secondary | ICD-10-CM | POA: Diagnosis not present

## 2023-04-08 DIAGNOSIS — D689 Coagulation defect, unspecified: Secondary | ICD-10-CM | POA: Diagnosis not present

## 2023-04-08 DIAGNOSIS — N186 End stage renal disease: Secondary | ICD-10-CM | POA: Diagnosis not present

## 2023-04-08 DIAGNOSIS — R52 Pain, unspecified: Secondary | ICD-10-CM | POA: Diagnosis not present

## 2023-04-08 DIAGNOSIS — D688 Other specified coagulation defects: Secondary | ICD-10-CM | POA: Diagnosis not present

## 2023-04-11 DIAGNOSIS — N2581 Secondary hyperparathyroidism of renal origin: Secondary | ICD-10-CM | POA: Diagnosis not present

## 2023-04-11 DIAGNOSIS — N186 End stage renal disease: Secondary | ICD-10-CM | POA: Diagnosis not present

## 2023-04-11 DIAGNOSIS — Z992 Dependence on renal dialysis: Secondary | ICD-10-CM | POA: Diagnosis not present

## 2023-04-11 DIAGNOSIS — R52 Pain, unspecified: Secondary | ICD-10-CM | POA: Diagnosis not present

## 2023-04-11 DIAGNOSIS — D688 Other specified coagulation defects: Secondary | ICD-10-CM | POA: Diagnosis not present

## 2023-04-11 DIAGNOSIS — T8249XA Other complication of vascular dialysis catheter, initial encounter: Secondary | ICD-10-CM | POA: Diagnosis not present

## 2023-04-11 DIAGNOSIS — D689 Coagulation defect, unspecified: Secondary | ICD-10-CM | POA: Diagnosis not present

## 2023-04-13 DIAGNOSIS — N2581 Secondary hyperparathyroidism of renal origin: Secondary | ICD-10-CM | POA: Diagnosis not present

## 2023-04-13 DIAGNOSIS — D688 Other specified coagulation defects: Secondary | ICD-10-CM | POA: Diagnosis not present

## 2023-04-13 DIAGNOSIS — N186 End stage renal disease: Secondary | ICD-10-CM | POA: Diagnosis not present

## 2023-04-13 DIAGNOSIS — R52 Pain, unspecified: Secondary | ICD-10-CM | POA: Diagnosis not present

## 2023-04-13 DIAGNOSIS — T8249XA Other complication of vascular dialysis catheter, initial encounter: Secondary | ICD-10-CM | POA: Diagnosis not present

## 2023-04-13 DIAGNOSIS — D689 Coagulation defect, unspecified: Secondary | ICD-10-CM | POA: Diagnosis not present

## 2023-04-13 DIAGNOSIS — Z992 Dependence on renal dialysis: Secondary | ICD-10-CM | POA: Diagnosis not present

## 2023-04-15 DIAGNOSIS — T8249XA Other complication of vascular dialysis catheter, initial encounter: Secondary | ICD-10-CM | POA: Diagnosis not present

## 2023-04-15 DIAGNOSIS — Z992 Dependence on renal dialysis: Secondary | ICD-10-CM | POA: Diagnosis not present

## 2023-04-15 DIAGNOSIS — N186 End stage renal disease: Secondary | ICD-10-CM | POA: Diagnosis not present

## 2023-04-15 DIAGNOSIS — N2581 Secondary hyperparathyroidism of renal origin: Secondary | ICD-10-CM | POA: Diagnosis not present

## 2023-04-15 DIAGNOSIS — D688 Other specified coagulation defects: Secondary | ICD-10-CM | POA: Diagnosis not present

## 2023-04-15 DIAGNOSIS — D689 Coagulation defect, unspecified: Secondary | ICD-10-CM | POA: Diagnosis not present

## 2023-04-15 DIAGNOSIS — R52 Pain, unspecified: Secondary | ICD-10-CM | POA: Diagnosis not present

## 2023-04-18 DIAGNOSIS — D689 Coagulation defect, unspecified: Secondary | ICD-10-CM | POA: Diagnosis not present

## 2023-04-18 DIAGNOSIS — N186 End stage renal disease: Secondary | ICD-10-CM | POA: Diagnosis not present

## 2023-04-18 DIAGNOSIS — D688 Other specified coagulation defects: Secondary | ICD-10-CM | POA: Diagnosis not present

## 2023-04-18 DIAGNOSIS — N2581 Secondary hyperparathyroidism of renal origin: Secondary | ICD-10-CM | POA: Diagnosis not present

## 2023-04-18 DIAGNOSIS — T8249XA Other complication of vascular dialysis catheter, initial encounter: Secondary | ICD-10-CM | POA: Diagnosis not present

## 2023-04-18 DIAGNOSIS — Z992 Dependence on renal dialysis: Secondary | ICD-10-CM | POA: Diagnosis not present

## 2023-04-18 DIAGNOSIS — R52 Pain, unspecified: Secondary | ICD-10-CM | POA: Diagnosis not present

## 2023-04-20 DIAGNOSIS — I120 Hypertensive chronic kidney disease with stage 5 chronic kidney disease or end stage renal disease: Secondary | ICD-10-CM | POA: Diagnosis not present

## 2023-04-20 DIAGNOSIS — N2581 Secondary hyperparathyroidism of renal origin: Secondary | ICD-10-CM | POA: Diagnosis not present

## 2023-04-20 DIAGNOSIS — Z992 Dependence on renal dialysis: Secondary | ICD-10-CM | POA: Diagnosis not present

## 2023-04-20 DIAGNOSIS — D688 Other specified coagulation defects: Secondary | ICD-10-CM | POA: Diagnosis not present

## 2023-04-20 DIAGNOSIS — R52 Pain, unspecified: Secondary | ICD-10-CM | POA: Diagnosis not present

## 2023-04-20 DIAGNOSIS — D689 Coagulation defect, unspecified: Secondary | ICD-10-CM | POA: Diagnosis not present

## 2023-04-20 DIAGNOSIS — N186 End stage renal disease: Secondary | ICD-10-CM | POA: Diagnosis not present

## 2023-04-20 DIAGNOSIS — T8249XA Other complication of vascular dialysis catheter, initial encounter: Secondary | ICD-10-CM | POA: Diagnosis not present

## 2023-04-21 ENCOUNTER — Ambulatory Visit (INDEPENDENT_AMBULATORY_CARE_PROVIDER_SITE_OTHER): Payer: 59 | Admitting: Podiatry

## 2023-04-21 DIAGNOSIS — L600 Ingrowing nail: Secondary | ICD-10-CM | POA: Diagnosis not present

## 2023-04-21 NOTE — Progress Notes (Signed)
Subjective: TIMECA VALIDO is a 67 y.o.  female returns to office today for follow up evaluation after having right Hallux medial border nail ingrown removal with phenol and alcohol matrixectomy approximately 2 weeks ago. Patient has been soaking using epsom salts soaks and applying topical antibiotic covered with bandaid daily. Patient denies fevers, chills, nausea, vomiting. Denies any calf pain, chest pain, SOB.   Objective:  Vitals: Reviewed  General: Well developed, nourished, in no acute distress, alert and oriented x3   Dermatology: Skin is warm, dry and supple bilateral. right hallux nail border appears to be clean, dry, with mild granular tissue and surrounding scab. There is no surrounding erythema, edema, drainage/purulence. The remaining nails appear unremarkable at this time. There are no other lesions or other signs of infection present.  Neurovascular status: Intact. No lower extremity swelling; No pain with calf compression bilateral.  Musculoskeletal: Decreased tenderness to palpation of the right hallux nail fold(s). Muscular strength within normal limits bilateral.   Assesement and Plan: S/p phenol and alcohol matrixectomy to the  right hallux nail medial, doing well.   -Continue soaking in epsom salts twice a day followed by antibiotic ointment and a band-aid. Can leave uncovered at night. Continue this until completely healed.  -If the area has not healed in 2 weeks, call the office for follow-up appointment, or sooner if any problems arise.  -Monitor for any signs/symptoms of infection. Call the office immediately if any occur or go directly to the emergency room. Call with any questions/concerns.        Corinna Gab, DPM [facility] / [facility]                   04/21/2023

## 2023-04-22 DIAGNOSIS — Z992 Dependence on renal dialysis: Secondary | ICD-10-CM | POA: Diagnosis not present

## 2023-04-22 DIAGNOSIS — D688 Other specified coagulation defects: Secondary | ICD-10-CM | POA: Diagnosis not present

## 2023-04-22 DIAGNOSIS — N186 End stage renal disease: Secondary | ICD-10-CM | POA: Diagnosis not present

## 2023-04-22 DIAGNOSIS — R509 Fever, unspecified: Secondary | ICD-10-CM | POA: Diagnosis not present

## 2023-04-22 DIAGNOSIS — N2581 Secondary hyperparathyroidism of renal origin: Secondary | ICD-10-CM | POA: Diagnosis not present

## 2023-04-22 DIAGNOSIS — T8249XA Other complication of vascular dialysis catheter, initial encounter: Secondary | ICD-10-CM | POA: Diagnosis not present

## 2023-04-22 DIAGNOSIS — D689 Coagulation defect, unspecified: Secondary | ICD-10-CM | POA: Diagnosis not present

## 2023-04-25 DIAGNOSIS — T8249XA Other complication of vascular dialysis catheter, initial encounter: Secondary | ICD-10-CM | POA: Diagnosis not present

## 2023-04-25 DIAGNOSIS — R509 Fever, unspecified: Secondary | ICD-10-CM | POA: Diagnosis not present

## 2023-04-25 DIAGNOSIS — Z992 Dependence on renal dialysis: Secondary | ICD-10-CM | POA: Diagnosis not present

## 2023-04-25 DIAGNOSIS — N186 End stage renal disease: Secondary | ICD-10-CM | POA: Diagnosis not present

## 2023-04-25 DIAGNOSIS — D689 Coagulation defect, unspecified: Secondary | ICD-10-CM | POA: Diagnosis not present

## 2023-04-25 DIAGNOSIS — N2581 Secondary hyperparathyroidism of renal origin: Secondary | ICD-10-CM | POA: Diagnosis not present

## 2023-04-25 DIAGNOSIS — D688 Other specified coagulation defects: Secondary | ICD-10-CM | POA: Diagnosis not present

## 2023-04-27 DIAGNOSIS — N186 End stage renal disease: Secondary | ICD-10-CM | POA: Diagnosis not present

## 2023-04-27 DIAGNOSIS — D688 Other specified coagulation defects: Secondary | ICD-10-CM | POA: Diagnosis not present

## 2023-04-27 DIAGNOSIS — Z992 Dependence on renal dialysis: Secondary | ICD-10-CM | POA: Diagnosis not present

## 2023-04-27 DIAGNOSIS — D689 Coagulation defect, unspecified: Secondary | ICD-10-CM | POA: Diagnosis not present

## 2023-04-27 DIAGNOSIS — T8249XA Other complication of vascular dialysis catheter, initial encounter: Secondary | ICD-10-CM | POA: Diagnosis not present

## 2023-04-27 DIAGNOSIS — N2581 Secondary hyperparathyroidism of renal origin: Secondary | ICD-10-CM | POA: Diagnosis not present

## 2023-04-27 DIAGNOSIS — R509 Fever, unspecified: Secondary | ICD-10-CM | POA: Diagnosis not present

## 2023-04-29 DIAGNOSIS — T8249XA Other complication of vascular dialysis catheter, initial encounter: Secondary | ICD-10-CM | POA: Diagnosis not present

## 2023-04-29 DIAGNOSIS — Z992 Dependence on renal dialysis: Secondary | ICD-10-CM | POA: Diagnosis not present

## 2023-04-29 DIAGNOSIS — D688 Other specified coagulation defects: Secondary | ICD-10-CM | POA: Diagnosis not present

## 2023-04-29 DIAGNOSIS — N186 End stage renal disease: Secondary | ICD-10-CM | POA: Diagnosis not present

## 2023-04-29 DIAGNOSIS — R509 Fever, unspecified: Secondary | ICD-10-CM | POA: Diagnosis not present

## 2023-04-29 DIAGNOSIS — D689 Coagulation defect, unspecified: Secondary | ICD-10-CM | POA: Diagnosis not present

## 2023-04-29 DIAGNOSIS — N2581 Secondary hyperparathyroidism of renal origin: Secondary | ICD-10-CM | POA: Diagnosis not present

## 2023-05-02 DIAGNOSIS — T8249XA Other complication of vascular dialysis catheter, initial encounter: Secondary | ICD-10-CM | POA: Diagnosis not present

## 2023-05-02 DIAGNOSIS — N186 End stage renal disease: Secondary | ICD-10-CM | POA: Diagnosis not present

## 2023-05-02 DIAGNOSIS — R509 Fever, unspecified: Secondary | ICD-10-CM | POA: Diagnosis not present

## 2023-05-02 DIAGNOSIS — D688 Other specified coagulation defects: Secondary | ICD-10-CM | POA: Diagnosis not present

## 2023-05-02 DIAGNOSIS — D689 Coagulation defect, unspecified: Secondary | ICD-10-CM | POA: Diagnosis not present

## 2023-05-02 DIAGNOSIS — N2581 Secondary hyperparathyroidism of renal origin: Secondary | ICD-10-CM | POA: Diagnosis not present

## 2023-05-02 DIAGNOSIS — Z992 Dependence on renal dialysis: Secondary | ICD-10-CM | POA: Diagnosis not present

## 2023-05-04 DIAGNOSIS — T8249XA Other complication of vascular dialysis catheter, initial encounter: Secondary | ICD-10-CM | POA: Diagnosis not present

## 2023-05-04 DIAGNOSIS — N2581 Secondary hyperparathyroidism of renal origin: Secondary | ICD-10-CM | POA: Diagnosis not present

## 2023-05-04 DIAGNOSIS — R509 Fever, unspecified: Secondary | ICD-10-CM | POA: Diagnosis not present

## 2023-05-04 DIAGNOSIS — D688 Other specified coagulation defects: Secondary | ICD-10-CM | POA: Diagnosis not present

## 2023-05-04 DIAGNOSIS — Z992 Dependence on renal dialysis: Secondary | ICD-10-CM | POA: Diagnosis not present

## 2023-05-04 DIAGNOSIS — D689 Coagulation defect, unspecified: Secondary | ICD-10-CM | POA: Diagnosis not present

## 2023-05-04 DIAGNOSIS — N186 End stage renal disease: Secondary | ICD-10-CM | POA: Diagnosis not present

## 2023-05-06 DIAGNOSIS — D688 Other specified coagulation defects: Secondary | ICD-10-CM | POA: Diagnosis not present

## 2023-05-06 DIAGNOSIS — T8249XA Other complication of vascular dialysis catheter, initial encounter: Secondary | ICD-10-CM | POA: Diagnosis not present

## 2023-05-06 DIAGNOSIS — D689 Coagulation defect, unspecified: Secondary | ICD-10-CM | POA: Diagnosis not present

## 2023-05-06 DIAGNOSIS — N2581 Secondary hyperparathyroidism of renal origin: Secondary | ICD-10-CM | POA: Diagnosis not present

## 2023-05-06 DIAGNOSIS — R509 Fever, unspecified: Secondary | ICD-10-CM | POA: Diagnosis not present

## 2023-05-06 DIAGNOSIS — Z992 Dependence on renal dialysis: Secondary | ICD-10-CM | POA: Diagnosis not present

## 2023-05-06 DIAGNOSIS — N186 End stage renal disease: Secondary | ICD-10-CM | POA: Diagnosis not present

## 2023-05-09 DIAGNOSIS — D689 Coagulation defect, unspecified: Secondary | ICD-10-CM | POA: Diagnosis not present

## 2023-05-09 DIAGNOSIS — T8249XA Other complication of vascular dialysis catheter, initial encounter: Secondary | ICD-10-CM | POA: Diagnosis not present

## 2023-05-09 DIAGNOSIS — N186 End stage renal disease: Secondary | ICD-10-CM | POA: Diagnosis not present

## 2023-05-09 DIAGNOSIS — N2581 Secondary hyperparathyroidism of renal origin: Secondary | ICD-10-CM | POA: Diagnosis not present

## 2023-05-09 DIAGNOSIS — Z992 Dependence on renal dialysis: Secondary | ICD-10-CM | POA: Diagnosis not present

## 2023-05-09 DIAGNOSIS — D688 Other specified coagulation defects: Secondary | ICD-10-CM | POA: Diagnosis not present

## 2023-05-09 DIAGNOSIS — R509 Fever, unspecified: Secondary | ICD-10-CM | POA: Diagnosis not present

## 2023-05-11 DIAGNOSIS — D688 Other specified coagulation defects: Secondary | ICD-10-CM | POA: Diagnosis not present

## 2023-05-11 DIAGNOSIS — D689 Coagulation defect, unspecified: Secondary | ICD-10-CM | POA: Diagnosis not present

## 2023-05-11 DIAGNOSIS — R509 Fever, unspecified: Secondary | ICD-10-CM | POA: Diagnosis not present

## 2023-05-11 DIAGNOSIS — N2581 Secondary hyperparathyroidism of renal origin: Secondary | ICD-10-CM | POA: Diagnosis not present

## 2023-05-11 DIAGNOSIS — T8249XA Other complication of vascular dialysis catheter, initial encounter: Secondary | ICD-10-CM | POA: Diagnosis not present

## 2023-05-11 DIAGNOSIS — Z992 Dependence on renal dialysis: Secondary | ICD-10-CM | POA: Diagnosis not present

## 2023-05-11 DIAGNOSIS — N186 End stage renal disease: Secondary | ICD-10-CM | POA: Diagnosis not present

## 2023-05-12 DIAGNOSIS — Z992 Dependence on renal dialysis: Secondary | ICD-10-CM | POA: Diagnosis not present

## 2023-05-12 DIAGNOSIS — Z452 Encounter for adjustment and management of vascular access device: Secondary | ICD-10-CM | POA: Diagnosis not present

## 2023-05-12 DIAGNOSIS — N186 End stage renal disease: Secondary | ICD-10-CM | POA: Diagnosis not present

## 2023-05-13 DIAGNOSIS — T8249XA Other complication of vascular dialysis catheter, initial encounter: Secondary | ICD-10-CM | POA: Diagnosis not present

## 2023-05-13 DIAGNOSIS — Z992 Dependence on renal dialysis: Secondary | ICD-10-CM | POA: Diagnosis not present

## 2023-05-13 DIAGNOSIS — N186 End stage renal disease: Secondary | ICD-10-CM | POA: Diagnosis not present

## 2023-05-13 DIAGNOSIS — R509 Fever, unspecified: Secondary | ICD-10-CM | POA: Diagnosis not present

## 2023-05-13 DIAGNOSIS — D689 Coagulation defect, unspecified: Secondary | ICD-10-CM | POA: Diagnosis not present

## 2023-05-13 DIAGNOSIS — N2581 Secondary hyperparathyroidism of renal origin: Secondary | ICD-10-CM | POA: Diagnosis not present

## 2023-05-13 DIAGNOSIS — D688 Other specified coagulation defects: Secondary | ICD-10-CM | POA: Diagnosis not present

## 2023-05-16 DIAGNOSIS — R509 Fever, unspecified: Secondary | ICD-10-CM | POA: Diagnosis not present

## 2023-05-16 DIAGNOSIS — N2581 Secondary hyperparathyroidism of renal origin: Secondary | ICD-10-CM | POA: Diagnosis not present

## 2023-05-16 DIAGNOSIS — D689 Coagulation defect, unspecified: Secondary | ICD-10-CM | POA: Diagnosis not present

## 2023-05-16 DIAGNOSIS — D688 Other specified coagulation defects: Secondary | ICD-10-CM | POA: Diagnosis not present

## 2023-05-16 DIAGNOSIS — N186 End stage renal disease: Secondary | ICD-10-CM | POA: Diagnosis not present

## 2023-05-16 DIAGNOSIS — Z992 Dependence on renal dialysis: Secondary | ICD-10-CM | POA: Diagnosis not present

## 2023-05-16 DIAGNOSIS — T8249XA Other complication of vascular dialysis catheter, initial encounter: Secondary | ICD-10-CM | POA: Diagnosis not present

## 2023-05-18 DIAGNOSIS — D688 Other specified coagulation defects: Secondary | ICD-10-CM | POA: Diagnosis not present

## 2023-05-18 DIAGNOSIS — Z992 Dependence on renal dialysis: Secondary | ICD-10-CM | POA: Diagnosis not present

## 2023-05-18 DIAGNOSIS — R509 Fever, unspecified: Secondary | ICD-10-CM | POA: Diagnosis not present

## 2023-05-18 DIAGNOSIS — D689 Coagulation defect, unspecified: Secondary | ICD-10-CM | POA: Diagnosis not present

## 2023-05-18 DIAGNOSIS — T8249XA Other complication of vascular dialysis catheter, initial encounter: Secondary | ICD-10-CM | POA: Diagnosis not present

## 2023-05-18 DIAGNOSIS — N2581 Secondary hyperparathyroidism of renal origin: Secondary | ICD-10-CM | POA: Diagnosis not present

## 2023-05-18 DIAGNOSIS — N186 End stage renal disease: Secondary | ICD-10-CM | POA: Diagnosis not present

## 2023-05-20 DIAGNOSIS — N2581 Secondary hyperparathyroidism of renal origin: Secondary | ICD-10-CM | POA: Diagnosis not present

## 2023-05-20 DIAGNOSIS — T8249XA Other complication of vascular dialysis catheter, initial encounter: Secondary | ICD-10-CM | POA: Diagnosis not present

## 2023-05-20 DIAGNOSIS — N186 End stage renal disease: Secondary | ICD-10-CM | POA: Diagnosis not present

## 2023-05-20 DIAGNOSIS — R509 Fever, unspecified: Secondary | ICD-10-CM | POA: Diagnosis not present

## 2023-05-20 DIAGNOSIS — D688 Other specified coagulation defects: Secondary | ICD-10-CM | POA: Diagnosis not present

## 2023-05-20 DIAGNOSIS — D689 Coagulation defect, unspecified: Secondary | ICD-10-CM | POA: Diagnosis not present

## 2023-05-20 DIAGNOSIS — Z992 Dependence on renal dialysis: Secondary | ICD-10-CM | POA: Diagnosis not present

## 2023-05-21 DIAGNOSIS — I120 Hypertensive chronic kidney disease with stage 5 chronic kidney disease or end stage renal disease: Secondary | ICD-10-CM | POA: Diagnosis not present

## 2023-05-21 DIAGNOSIS — Z992 Dependence on renal dialysis: Secondary | ICD-10-CM | POA: Diagnosis not present

## 2023-05-21 DIAGNOSIS — N186 End stage renal disease: Secondary | ICD-10-CM | POA: Diagnosis not present

## 2023-05-23 DIAGNOSIS — D688 Other specified coagulation defects: Secondary | ICD-10-CM | POA: Diagnosis not present

## 2023-05-23 DIAGNOSIS — Z992 Dependence on renal dialysis: Secondary | ICD-10-CM | POA: Diagnosis not present

## 2023-05-23 DIAGNOSIS — N2581 Secondary hyperparathyroidism of renal origin: Secondary | ICD-10-CM | POA: Diagnosis not present

## 2023-05-23 DIAGNOSIS — N186 End stage renal disease: Secondary | ICD-10-CM | POA: Diagnosis not present

## 2023-05-25 ENCOUNTER — Ambulatory Visit (HOSPITAL_COMMUNITY)
Admission: EM | Admit: 2023-05-25 | Discharge: 2023-05-25 | Disposition: A | Discharge status: Home / Self Care | Payer: 59 | Attending: Emergency Medicine | Admitting: Emergency Medicine

## 2023-05-25 ENCOUNTER — Encounter (HOSPITAL_COMMUNITY): Payer: Self-pay

## 2023-05-25 DIAGNOSIS — D688 Other specified coagulation defects: Secondary | ICD-10-CM | POA: Diagnosis not present

## 2023-05-25 DIAGNOSIS — Z992 Dependence on renal dialysis: Secondary | ICD-10-CM | POA: Diagnosis not present

## 2023-05-25 DIAGNOSIS — N2581 Secondary hyperparathyroidism of renal origin: Secondary | ICD-10-CM | POA: Diagnosis not present

## 2023-05-25 DIAGNOSIS — J069 Acute upper respiratory infection, unspecified: Secondary | ICD-10-CM | POA: Diagnosis not present

## 2023-05-25 DIAGNOSIS — N186 End stage renal disease: Secondary | ICD-10-CM | POA: Diagnosis not present

## 2023-05-25 LAB — POCT RAPID STREP A (OFFICE): Rapid Strep A Screen: NEGATIVE

## 2023-05-25 MED ORDER — AMOXICILLIN 250 MG/5ML PO SUSR: 500.0000 mg | Freq: Two times a day (BID) | ORAL | 0 refills | Status: AC

## 2023-05-25 MED ORDER — PREDNISOLONE 15 MG/5ML PO SOLN: 40.0000 mg | Freq: Every day | ORAL | 0 refills | Status: AC

## 2023-05-25 NOTE — Discharge Instructions (Addendum)
Begin amoxicillin every morning and every evening for 7 days, should clear any bacteria that is causing symptoms to prolong  Begin prednisolone every morning with food for 5 days to open and relaxed airway check, should calm harshness of cough and wheezing  Strep test is negative for bacteria to the throat    You can take Tylenol and/or Ibuprofen as needed for fever reduction and pain relief.   For cough: honey 1/2 to 1 teaspoon (you can dilute the honey in water or another fluid).  You can also use guaifenesin and dextromethorphan for cough. You can use a humidifier for chest congestion and cough.  If you don't have a humidifier, you can sit in the bathroom with the hot shower running.      For sore throat: try warm salt water gargles, cepacol lozenges, throat spray, warm tea or water with lemon/honey, popsicles or ice, or OTC cold relief medicine for throat discomfort.   For congestion: take a daily anti-histamine like Zyrtec, Claritin, and a oral decongestant, such as pseudoephedrine.  You can also use Flonase 1-2 sprays in each nostril daily.   It is important to stay hydrated: drink plenty of fluids (water, gatorade/powerade/pedialyte, juices, or teas) to keep your throat moisturized and help further relieve irritation/discomfort.

## 2023-05-25 NOTE — ED Provider Notes (Signed)
[facility]    CSN: [id number] Arrival date & time: 05/25/23  0930      History   Chief Complaint Chief Complaint  Patient presents with   Nasal Congestion    X2 weeks Nasal congestion, cough, and scratchy throat    HPI Victoria [name] is a 67 y.o. female.   Patient presents for evaluation of nasal congestion, rhinorrhea, productive cough and intermittent wheezing present for 2 weeks.  Associated scratchy throat, denies presence of pain.  Feels as if when eating and taking pills, it is getting caught, having pain with swallowing.  Has attempted use of Robitussin and Mucinex which has been ineffective.  Denies fever chills and shortness of breath.  History of end-stage renal disease, dialysis, has not missed treatment.  History of chronic bronchitis.    Past Medical History:  Diagnosis Date   Arthritis    Chronic bronchitis (HCC)    Complication of anesthesia ~ 2011   "they gave me a medicine that swolled me and mouth burning up" (08/23/2012)   Complication of anesthesia    slow to wake up   ESRD (end stage renal disease) on dialysis [facility]) Nephrologist-- dr deterding   ESRD due to HTN-; "M/W/F; [address]" (11/28/2015   GERD (gastroesophageal reflux disease)    no meds, diet controlled   History of acute pulmonary edema    2003   Hypertension    no longer on medications   Left patella fracture    Pneumonia    as a child   Seasonal allergies    Secondary hyperparathyroidism, renal (HCC)    s/p  total parathyroidectomy  2014   Thyroid disease    Wears glasses    Wound dehiscence, surgical 11/28/2015    Patient Active Problem List   Diagnosis Date Noted   Class 1 obesity due to excess calories with body mass index (BMI) of 31.0 to 31.9 in adult 03/08/2023   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) 03/07/2023   Leukocytosis 03/07/2023   Complication of vascular dialysis catheter 01/20/2022   ESRD on dialysis (HCC) 01/13/2022   S/P  total parathyroidectomy 01/13/2022   Aortic atherosclerosis (HCC) 02/23/2021   Bilateral carpal tunnel syndrome 02/26/2019   Numbness 02/26/2019   Pain 02/26/2019   Trigger index finger of right hand 02/26/2019   Bilateral sensorineural hearing loss 01/16/2019   Subjective tinnitus, bilateral 01/16/2019   Deficiency of macronutrients 10/04/2013   Mechanical complication of other vascular device, implant, and graft 01/12/2013   Hyperlipidemia 10/25/2012   Thyroid nodule 03/22/2011   Hypotension of hemodialysis 03/10/2011   Anemia in chronic kidney disease 06/28/2001   Iron deficiency anemia 06/28/2001    Past Surgical History:  Procedure Laterality Date   ANKLE FRACTURE SURGERY Right ~ 2005   "has pins in it"   APPLICATION OF WOUND VAC Left 11/28/2015   knee   APPLICATION OF WOUND VAC Left 11/28/2015   Procedure: APPLICATION OF WOUND VAC;  Surgeon: Samson Frederic, MD;  Location: [facility] OR;  Service: Orthopedics;  Laterality: Left;   ARTERIOVENOUS GRAFT PLACEMENT  03/25/2005   Right forearm  w/  multiple Revision's    AV FISTULA PLACEMENT Right 04/13/2022   Procedure: INSERTION OF RIGHT ARTERIOVENOUS (AV) 4-76mm x 45cm GORE-TEX GRAFT;  Surgeon: Chuck Hint, MD;  Location: [facility] OR;  Service: Vascular;  Laterality: Right;   CARDIOVASCULAR STRESS TEST  08/24/2012   abnormal nuclear study/  inferolateral and anteroseptal areas of scar,  no ischemia/  normal LV function and wall motion, ef 77%   CATARACT EXTRACTION W/ INTRAOCULAR LENS IMPLANT Bilateral    cataract removal bilateral, lens implant in right eye   DIALYSIS FISTULA CREATION  09/20/1990   left upper arm ---  w/  Multiple Revision's until 2006   ECTOPIC PREGNANCY SURGERY  05/21/1969   FRACTURE SURGERY     HARDWARE REMOVAL Right 07/10/2020   Procedure: Removal of deep implant right fibula and tibia; steroid injection right ankle;  Surgeon: Toni Arthurs, MD;  Location: [facility] OR;  Service: Orthopedics;  Laterality: Right;   I &  D EXTREMITY Left 11/28/2015   Procedure: IRRIGATION AND DEBRIDEMENT LEFT KNEE WOUND ;  Surgeon: Samson Frederic, MD;  Location: [facility] OR;  Service: Orthopedics;  Laterality: Left;   INCISION AND DRAINAGE OF WOUND Left 11/28/2015   knee   INSERTION OF DIALYSIS CATHETER Right 03/07/2023   Procedure: Enis Gash GUIDED INSERTION OF TUNNELED DIALYSIS CATHETER;  Surgeon: Leonie Douglas, MD;  [facility] OR;  Service: Vascular;  Laterality: Right;   IR FLUORO GUIDE CV LINE LEFT  02/09/2022   ORIF PATELLA Left 10/31/2015   Procedure: OPEN REDUCTION INTERNAL (ORIF) FIXATION LEFT PATELLA;  Surgeon: Samson Frederic, MD;  Location: [facility];  Service: Orthopedics;  Laterality: Left;   PATELLAR TENDON REPAIR  10/03/2012   Procedure: PATELLA TENDON REPAIR;  Surgeon: Senaida Lange, MD;  Location: [facility];  Service: Orthopedics;  Laterality: Right;   PATELLECTOMY  10/03/2012   Procedure: PATELLECTOMY;  Surgeon: Senaida Lange, MD;  Location: [facility];  Service: Orthopedics;  Laterality: Right;  RIGHT PARTIAL PATELLECTOMY AND PATELLA TENDON REPAIR   REVISION OF ARTERIOVENOUS GORETEX GRAFT  09/27/2012   Procedure: REVISION OF ARTERIOVENOUS GORETEX GRAFT;  Surgeon: Pryor Ochoa, MD;  Location: [facility] OR;  Service: Vascular;  Laterality: Right;  1) Replacement of venous half of loop with 6mm Gortex graft  2) Excision of erroded pseudoaneurysm of graft with primary closure.   REVISION OF ARTERIOVENOUS GORETEX GRAFT Right 01/23/2013   Procedure: REVISION OF ARTERIOVENOUS GORETEX GRAFT;  Surgeon: Fransisco Hertz, MD;  Location: [facility] OR;  Service: Vascular;  Laterality: Right;  Using piece of 6mm x 20cm Gortex graft.    REVISION OF ARTERIOVENOUS GORETEX GRAFT Right 10/07/2015   Procedure: REVISION OF Right arm ARTERIOVENOUS GORETEX GRAFT;  Surgeon: Sherren Kerns, MD;  Location: [facility] OR;  Service: Vascular;  Laterality: Right;   REVISION OF ARTERIOVENOUS GORETEX GRAFT Right 02/17/2016   Procedure: REVISION OF  ARTERIOVENOUS GORETEX GRAFT;  Surgeon: Sherren Kerns, MD;  Location: [facility] OR;  Service: Vascular;  Laterality: Right;   REVISON OF ARTERIOVENOUS FISTULA Right 03/07/2023   Procedure: REVISON OF ARTERIOVENOUS FISTULA ARM;  Surgeon: Leonie Douglas, MD;  Location: [facility] OR;  Service: Vascular;  Laterality: Right;   TOOTH EXTRACTION     10/2021   TOTAL ABDOMINAL HYSTERECTOMY  03/05/2005   w/  Right Ovarian Cystectomy   TOTAL PARATHYROIDECTOMY/  THYROID ISTHMUSECTOMY/  AUTOTRANSPLANTATION PARATHYROID TISSUE TO LEFT BRACHIORIADIALIS MUSCLE  02/18/2011   TRANSTHORACIC ECHOCARDIOGRAM  10/31/2012   mild LVH,  grade 1 diastolic dysfunction,  ef 55-65%/  mild MR/  trivial TR    OB History   No obstetric history on file.      Home Medications    Prior to Admission medications   Medication Sig Start Date End Date Taking? Authorizing Provider  AURYXIA 1 GM 210 MG(Fe) tablet Take 210 mg by mouth See admin instructions. Take 1  tablet by mouth three times a day with meals and take 1 tablet twice per day with snacks. 12/10/22  Yes [provider]  azithromycin (ZITHROMAX) 250 MG tablet Take 1 tablet (250 mg total) by mouth daily. Take first 2 tablets together, then 1 every day until finished. 03/31/23  Yes Kermitt Harjo R, NP  benzonatate (TESSALON) 100 MG capsule Take 1 capsule (100 mg total) by mouth every 8 (eight) hours. 03/31/23  Yes Lean Fayson R, NP  chlorhexidine (PERIDEX) 0.12 % solution Use as directed 15 mLs in the mouth or throat 2 (two) times daily. 02/12/23  Yes [provider]  midodrine (PROAMATINE) 10 MG tablet Take 10 mg by mouth See admin instructions. Take 1 tablet by mouth three times a week as directed take one 30 minutes before starting dialysis and repeat x 1 halfway through tx. 12/08/22  Yes [provider]  predniSONE (DELTASONE) 20 MG tablet Take 2 tablets (40 mg total) by mouth daily. 03/31/23  Yes Gaynor Ferreras, Elita Boone, NP  sevelamer carbonate (RENVELA) 2.4  g PACK Take 4.8 g by mouth 3 (three) times daily with meals. 04/07/22  Yes [provider]  SODIUM FLUORIDE 5000 PLUS 1.1 % CREA dental cream Place 1 Application onto teeth daily. 02/12/23  Yes [provider]    Family History Family History  Problem Relation Age of Onset   Healthy Mother    Hypertension Father    Kidney failure Father    Hypertension Sister    Thyroid disease Sister     Social History Social History   Tobacco Use   Smoking status: Never    Passive exposure: Never   Smokeless tobacco: Never  Vaping Use   Vaping status: Never Used  Substance Use Topics   Alcohol use: Not Currently    Comment: occasionally drinks a few beers   Drug use: No     Allergies   Amlodipine, Cefazolin, Cephalosporins, and Lisinopril   Review of Systems Review of Systems   Physical Exam Triage Vital Signs ED Triage Vitals  Encounter Vitals Group     BP 05/25/23 1033 119/68     Systolic BP Percentile --      Diastolic BP Percentile --      Pulse Rate 05/25/23 1033 68     Resp 05/25/23 1033 17     Temp 05/25/23 1033 (!) 97.5 F (36.4 C)     Temp Source 05/25/23 1033 Oral     SpO2 05/25/23 1033 97 %     Weight 05/25/23 1031 170 lb (77.1 kg)     Height 05/25/23 1031 5' 3.5" (1.613 m)     Head Circumference --      Peak Flow --      Pain Score 05/25/23 1031 0     Pain Loc --      Pain Education --      Exclude from Growth Chart --    No data found.  Updated Vital Signs BP 119/68 (BP Location: Left Arm)   Pulse 68   Temp (!) 97.5 F (36.4 C) (Oral)   Resp 17   Ht 5' 3.5" (1.613 m)   Wt 170 lb (77.1 kg)   LMP  (LMP Unknown)   SpO2 97%   BMI 29.64 kg/m   Visual Acuity Right Eye Distance:   Left Eye Distance:   Bilateral Distance:    Right Eye Near:   Left Eye Near:    Bilateral Near:     Physical  Exam Constitutional:      Appearance: Normal appearance.  HENT:     Head: Normocephalic.     Right Ear: Tympanic membrane, ear canal and  external ear normal.     Left Ear: Tympanic membrane, ear canal and external ear normal.     Nose: Congestion present. No rhinorrhea.     Mouth/Throat:     Mouth: Mucous membranes are moist.     Pharynx: Oropharynx is clear. Posterior oropharyngeal erythema present. No oropharyngeal exudate.  Eyes:     Extraocular Movements: Extraocular movements intact.  Cardiovascular:     Rate and Rhythm: Normal rate and regular rhythm.     Pulses: Normal pulses.     Heart sounds: Normal heart sounds.  Pulmonary:     Effort: Pulmonary effort is normal.     Breath sounds: Normal breath sounds.  Musculoskeletal:     Cervical back: Normal range of motion and neck supple.  Skin:    General: Skin is warm and dry.  Neurological:     Mental Status: She is alert and oriented to person, place, and time. Mental status is at baseline.      UC Treatments / Results  Labs (all labs ordered are listed, but only abnormal results are displayed) Labs Reviewed  POCT RAPID STREP A (OFFICE)    EKG   Radiology No results found.  Procedures Procedures (including critical care time)  Medications Ordered in UC Medications - No data to display  Initial Impression / Assessment and Plan / UC Course  I have reviewed the triage vital signs and the nursing notes.  Pertinent labs & imaging results that were available during my care of the patient were reviewed by me and considered in my medical decision making (see chart for details).  Acute upper respiratory infection  Patient is in no signs of distress nor toxic appearing.  Vital signs are stable.  Low suspicion for pneumonia, pneumothorax or bronchitis and therefore will defer imaging.  Rapid strep test negative.  Viral testing deferred due to timeline of illness.  Prescribed amoxicillin and prednisolone.May use additional over-the-counter medications as needed for supportive care.  May follow-up with urgent care as needed if symptoms persist or worsen.     Final Clinical Impressions(s) / UC Diagnoses   Final diagnoses:  None   Discharge Instructions   None    ED Prescriptions   None    PDMP not reviewed this encounter.   Valinda Hoar, NP 05/25/23 1104

## 2023-05-25 NOTE — ED Triage Notes (Signed)
Pt states that she has nasal congestion, coughing, and a scratchy throat. X2 weeks

## 2023-05-30 DIAGNOSIS — N2581 Secondary hyperparathyroidism of renal origin: Secondary | ICD-10-CM | POA: Diagnosis not present

## 2023-05-30 DIAGNOSIS — N186 End stage renal disease: Secondary | ICD-10-CM | POA: Diagnosis not present

## 2023-05-30 DIAGNOSIS — D688 Other specified coagulation defects: Secondary | ICD-10-CM | POA: Diagnosis not present

## 2023-05-30 DIAGNOSIS — Z992 Dependence on renal dialysis: Secondary | ICD-10-CM | POA: Diagnosis not present

## 2023-06-01 DIAGNOSIS — Z992 Dependence on renal dialysis: Secondary | ICD-10-CM | POA: Diagnosis not present

## 2023-06-01 DIAGNOSIS — D688 Other specified coagulation defects: Secondary | ICD-10-CM | POA: Diagnosis not present

## 2023-06-01 DIAGNOSIS — N186 End stage renal disease: Secondary | ICD-10-CM | POA: Diagnosis not present

## 2023-06-01 DIAGNOSIS — N2581 Secondary hyperparathyroidism of renal origin: Secondary | ICD-10-CM | POA: Diagnosis not present

## 2023-06-03 DIAGNOSIS — D688 Other specified coagulation defects: Secondary | ICD-10-CM | POA: Diagnosis not present

## 2023-06-03 DIAGNOSIS — N2581 Secondary hyperparathyroidism of renal origin: Secondary | ICD-10-CM | POA: Diagnosis not present

## 2023-06-03 DIAGNOSIS — Z992 Dependence on renal dialysis: Secondary | ICD-10-CM | POA: Diagnosis not present

## 2023-06-03 DIAGNOSIS — N186 End stage renal disease: Secondary | ICD-10-CM | POA: Diagnosis not present

## 2023-06-06 DIAGNOSIS — D688 Other specified coagulation defects: Secondary | ICD-10-CM | POA: Diagnosis not present

## 2023-06-06 DIAGNOSIS — Z992 Dependence on renal dialysis: Secondary | ICD-10-CM | POA: Diagnosis not present

## 2023-06-06 DIAGNOSIS — N186 End stage renal disease: Secondary | ICD-10-CM | POA: Diagnosis not present

## 2023-06-06 DIAGNOSIS — N2581 Secondary hyperparathyroidism of renal origin: Secondary | ICD-10-CM | POA: Diagnosis not present

## 2023-06-08 DIAGNOSIS — N2581 Secondary hyperparathyroidism of renal origin: Secondary | ICD-10-CM | POA: Diagnosis not present

## 2023-06-08 DIAGNOSIS — N186 End stage renal disease: Secondary | ICD-10-CM | POA: Diagnosis not present

## 2023-06-08 DIAGNOSIS — D688 Other specified coagulation defects: Secondary | ICD-10-CM | POA: Diagnosis not present

## 2023-06-08 DIAGNOSIS — Z992 Dependence on renal dialysis: Secondary | ICD-10-CM | POA: Diagnosis not present

## 2023-06-09 ENCOUNTER — Other Ambulatory Visit: Payer: Self-pay | Admitting: Nurse Practitioner

## 2023-06-09 DIAGNOSIS — Z1231 Encounter for screening mammogram for malignant neoplasm of breast: Secondary | ICD-10-CM

## 2023-06-10 DIAGNOSIS — D688 Other specified coagulation defects: Secondary | ICD-10-CM | POA: Diagnosis not present

## 2023-06-10 DIAGNOSIS — N2581 Secondary hyperparathyroidism of renal origin: Secondary | ICD-10-CM | POA: Diagnosis not present

## 2023-06-10 DIAGNOSIS — N186 End stage renal disease: Secondary | ICD-10-CM | POA: Diagnosis not present

## 2023-06-10 DIAGNOSIS — Z992 Dependence on renal dialysis: Secondary | ICD-10-CM | POA: Diagnosis not present

## 2023-06-13 DIAGNOSIS — Z992 Dependence on renal dialysis: Secondary | ICD-10-CM | POA: Diagnosis not present

## 2023-06-13 DIAGNOSIS — N2581 Secondary hyperparathyroidism of renal origin: Secondary | ICD-10-CM | POA: Diagnosis not present

## 2023-06-13 DIAGNOSIS — D688 Other specified coagulation defects: Secondary | ICD-10-CM | POA: Diagnosis not present

## 2023-06-13 DIAGNOSIS — N186 End stage renal disease: Secondary | ICD-10-CM | POA: Diagnosis not present

## 2023-06-15 DIAGNOSIS — D688 Other specified coagulation defects: Secondary | ICD-10-CM | POA: Diagnosis not present

## 2023-06-15 DIAGNOSIS — N186 End stage renal disease: Secondary | ICD-10-CM | POA: Diagnosis not present

## 2023-06-15 DIAGNOSIS — Z992 Dependence on renal dialysis: Secondary | ICD-10-CM | POA: Diagnosis not present

## 2023-06-15 DIAGNOSIS — N2581 Secondary hyperparathyroidism of renal origin: Secondary | ICD-10-CM | POA: Diagnosis not present

## 2023-06-17 DIAGNOSIS — D688 Other specified coagulation defects: Secondary | ICD-10-CM | POA: Diagnosis not present

## 2023-06-17 DIAGNOSIS — Z992 Dependence on renal dialysis: Secondary | ICD-10-CM | POA: Diagnosis not present

## 2023-06-17 DIAGNOSIS — N186 End stage renal disease: Secondary | ICD-10-CM | POA: Diagnosis not present

## 2023-06-17 DIAGNOSIS — N2581 Secondary hyperparathyroidism of renal origin: Secondary | ICD-10-CM | POA: Diagnosis not present

## 2023-06-20 DIAGNOSIS — N186 End stage renal disease: Secondary | ICD-10-CM | POA: Diagnosis not present

## 2023-06-20 DIAGNOSIS — I120 Hypertensive chronic kidney disease with stage 5 chronic kidney disease or end stage renal disease: Secondary | ICD-10-CM | POA: Diagnosis not present

## 2023-06-20 DIAGNOSIS — N2581 Secondary hyperparathyroidism of renal origin: Secondary | ICD-10-CM | POA: Diagnosis not present

## 2023-06-20 DIAGNOSIS — Z992 Dependence on renal dialysis: Secondary | ICD-10-CM | POA: Diagnosis not present

## 2023-06-20 DIAGNOSIS — D688 Other specified coagulation defects: Secondary | ICD-10-CM | POA: Diagnosis not present

## 2023-06-22 DIAGNOSIS — N2581 Secondary hyperparathyroidism of renal origin: Secondary | ICD-10-CM | POA: Diagnosis not present

## 2023-06-22 DIAGNOSIS — D688 Other specified coagulation defects: Secondary | ICD-10-CM | POA: Diagnosis not present

## 2023-06-22 DIAGNOSIS — Z23 Encounter for immunization: Secondary | ICD-10-CM | POA: Diagnosis not present

## 2023-06-22 DIAGNOSIS — Z992 Dependence on renal dialysis: Secondary | ICD-10-CM | POA: Diagnosis not present

## 2023-06-22 DIAGNOSIS — D631 Anemia in chronic kidney disease: Secondary | ICD-10-CM | POA: Diagnosis not present

## 2023-06-22 DIAGNOSIS — N186 End stage renal disease: Secondary | ICD-10-CM | POA: Diagnosis not present

## 2023-06-24 ENCOUNTER — Other Ambulatory Visit: Payer: Self-pay | Admitting: Nurse Practitioner

## 2023-06-24 DIAGNOSIS — Z1212 Encounter for screening for malignant neoplasm of rectum: Secondary | ICD-10-CM

## 2023-06-24 DIAGNOSIS — Z992 Dependence on renal dialysis: Secondary | ICD-10-CM | POA: Diagnosis not present

## 2023-06-24 DIAGNOSIS — Z1211 Encounter for screening for malignant neoplasm of colon: Secondary | ICD-10-CM

## 2023-06-24 DIAGNOSIS — N186 End stage renal disease: Secondary | ICD-10-CM | POA: Diagnosis not present

## 2023-06-24 DIAGNOSIS — Z23 Encounter for immunization: Secondary | ICD-10-CM | POA: Diagnosis not present

## 2023-06-24 DIAGNOSIS — N2581 Secondary hyperparathyroidism of renal origin: Secondary | ICD-10-CM | POA: Diagnosis not present

## 2023-06-24 DIAGNOSIS — D688 Other specified coagulation defects: Secondary | ICD-10-CM | POA: Diagnosis not present

## 2023-06-24 DIAGNOSIS — D631 Anemia in chronic kidney disease: Secondary | ICD-10-CM | POA: Diagnosis not present

## 2023-06-27 DIAGNOSIS — D631 Anemia in chronic kidney disease: Secondary | ICD-10-CM | POA: Diagnosis not present

## 2023-06-27 DIAGNOSIS — D688 Other specified coagulation defects: Secondary | ICD-10-CM | POA: Diagnosis not present

## 2023-06-27 DIAGNOSIS — N2581 Secondary hyperparathyroidism of renal origin: Secondary | ICD-10-CM | POA: Diagnosis not present

## 2023-06-27 DIAGNOSIS — Z23 Encounter for immunization: Secondary | ICD-10-CM | POA: Diagnosis not present

## 2023-06-27 DIAGNOSIS — N186 End stage renal disease: Secondary | ICD-10-CM | POA: Diagnosis not present

## 2023-06-27 DIAGNOSIS — Z992 Dependence on renal dialysis: Secondary | ICD-10-CM | POA: Diagnosis not present

## 2023-06-29 DIAGNOSIS — N186 End stage renal disease: Secondary | ICD-10-CM | POA: Diagnosis not present

## 2023-06-29 DIAGNOSIS — N2581 Secondary hyperparathyroidism of renal origin: Secondary | ICD-10-CM | POA: Diagnosis not present

## 2023-06-29 DIAGNOSIS — Z992 Dependence on renal dialysis: Secondary | ICD-10-CM | POA: Diagnosis not present

## 2023-06-29 DIAGNOSIS — D631 Anemia in chronic kidney disease: Secondary | ICD-10-CM | POA: Diagnosis not present

## 2023-06-29 DIAGNOSIS — Z23 Encounter for immunization: Secondary | ICD-10-CM | POA: Diagnosis not present

## 2023-06-29 DIAGNOSIS — D688 Other specified coagulation defects: Secondary | ICD-10-CM | POA: Diagnosis not present

## 2023-07-01 DIAGNOSIS — N186 End stage renal disease: Secondary | ICD-10-CM | POA: Diagnosis not present

## 2023-07-01 DIAGNOSIS — D631 Anemia in chronic kidney disease: Secondary | ICD-10-CM | POA: Diagnosis not present

## 2023-07-01 DIAGNOSIS — D688 Other specified coagulation defects: Secondary | ICD-10-CM | POA: Diagnosis not present

## 2023-07-01 DIAGNOSIS — Z23 Encounter for immunization: Secondary | ICD-10-CM | POA: Diagnosis not present

## 2023-07-01 DIAGNOSIS — Z992 Dependence on renal dialysis: Secondary | ICD-10-CM | POA: Diagnosis not present

## 2023-07-01 DIAGNOSIS — N2581 Secondary hyperparathyroidism of renal origin: Secondary | ICD-10-CM | POA: Diagnosis not present

## 2023-07-04 DIAGNOSIS — D688 Other specified coagulation defects: Secondary | ICD-10-CM | POA: Diagnosis not present

## 2023-07-04 DIAGNOSIS — N2581 Secondary hyperparathyroidism of renal origin: Secondary | ICD-10-CM | POA: Diagnosis not present

## 2023-07-04 DIAGNOSIS — Z23 Encounter for immunization: Secondary | ICD-10-CM | POA: Diagnosis not present

## 2023-07-04 DIAGNOSIS — N186 End stage renal disease: Secondary | ICD-10-CM | POA: Diagnosis not present

## 2023-07-04 DIAGNOSIS — Z992 Dependence on renal dialysis: Secondary | ICD-10-CM | POA: Diagnosis not present

## 2023-07-04 DIAGNOSIS — D631 Anemia in chronic kidney disease: Secondary | ICD-10-CM | POA: Diagnosis not present

## 2023-07-05 ENCOUNTER — Ambulatory Visit (INDEPENDENT_AMBULATORY_CARE_PROVIDER_SITE_OTHER): Payer: 59

## 2023-07-05 DIAGNOSIS — Z1211 Encounter for screening for malignant neoplasm of colon: Secondary | ICD-10-CM

## 2023-07-05 DIAGNOSIS — Z0000 Encounter for general adult medical examination without abnormal findings: Secondary | ICD-10-CM

## 2023-07-05 NOTE — Progress Notes (Signed)
Subjective:   Victoria [name] is a 67 y.o. female who presents for Medicare Annual (Subsequent) preventive examination.  Visit Complete: Virtual I connected with  AIRANNA PARTIN on 07/05/23 by a audio enabled telemedicine application and verified that I am speaking with the correct person using two identifiers.  Patient Location: Home  Provider Location: Office/Clinic  I discussed the limitations of evaluation and management by telemedicine. The patient expressed understanding and agreed to proceed.  Vital Signs: Because this visit was a virtual/telehealth visit, some criteria may be missing or patient reported. Any vitals not documented were not able to be obtained and vitals that have been documented are patient reported.    Cardiac Risk Factors include: advanced age (>92men, >65 women);dyslipidemia     Objective:    Today's Vitals   There is no height or weight on file to calculate BMI.     07/05/2023   12:03 PM 03/11/2023   11:27 PM 03/07/2023    3:22 PM 03/07/2023   10:05 AM 03/07/2023    6:13 AM 07/15/2022    4:22 PM 04/13/2022    6:21 AM  Advanced Directives  Does Patient Have a Medical Advance Directive? No No No No No No No  Would patient like information on creating a medical advance directive?   No - Patient declined No - Patient declined  No - Patient declined No - Patient declined    Current Medications (verified) Outpatient Encounter Medications as of 07/05/2023  Medication Sig   calcium acetate (PHOSLO) 667 MG capsule Take by mouth.   midodrine (PROAMATINE) 10 MG tablet Take 10 mg by mouth See admin instructions. Take 1 tablet by mouth three times a week as directed take one 30 minutes before starting dialysis and repeat x 1 halfway through tx.   AURYXIA 1 GM 210 MG(Fe) tablet Take 210 mg by mouth See admin instructions. Take 1 tablet by mouth three times a day with meals and take 1 tablet twice per day with snacks. (Patient not taking: Reported on 07/05/2023)    azithromycin (ZITHROMAX) 250 MG tablet Take 1 tablet (250 mg total) by mouth daily. Take first 2 tablets together, then 1 every day until finished. (Patient not taking: Reported on 07/05/2023)   benzonatate (TESSALON) 100 MG capsule Take 1 capsule (100 mg total) by mouth every 8 (eight) hours. (Patient not taking: Reported on 07/05/2023)   chlorhexidine (PERIDEX) 0.12 % solution Use as directed 15 mLs in the mouth or throat 2 (two) times daily. (Patient not taking: Reported on 07/05/2023)   predniSONE (DELTASONE) 20 MG tablet Take 2 tablets (40 mg total) by mouth daily. (Patient not taking: Reported on 07/05/2023)   sevelamer carbonate (RENVELA) 2.4 g PACK Take 4.8 g by mouth 3 (three) times daily with meals. (Patient not taking: Reported on 07/05/2023)   SODIUM FLUORIDE 5000 PLUS 1.1 % CREA dental cream Place 1 Application onto teeth daily. (Patient not taking: Reported on 07/05/2023)   No facility-administered encounter medications on file as of 07/05/2023.    Allergies (verified) Amlodipine, Cefazolin, Cephalosporins, and Lisinopril   History: Past Medical History:  Diagnosis Date   Arthritis    Chronic bronchitis (HCC)    Complication of anesthesia ~ 2011   "they gave me a medicine that swolled me and mouth burning up" (08/23/2012)   Complication of anesthesia    slow to wake up   ESRD (end stage renal disease) on dialysis [facility]) Nephrologist-- dr deterding   ESRD due to HTN-; "M/W/F;  [address]" (11/28/2015   GERD (gastroesophageal reflux disease)    no meds, diet controlled   History of acute pulmonary edema    2003   Hypertension    no longer on medications   Left patella fracture    Pneumonia    as a child   Seasonal allergies    Secondary hyperparathyroidism, renal (HCC)    s/p  total parathyroidectomy  2014   Thyroid disease    Wears glasses    Wound dehiscence, surgical 11/28/2015   Past Surgical History:  Procedure Laterality Date   ANKLE FRACTURE SURGERY Right  ~ 2005   "has pins in it"   APPLICATION OF WOUND VAC Left 11/28/2015   knee   APPLICATION OF WOUND VAC Left 11/28/2015   Procedure: APPLICATION OF WOUND VAC;  Surgeon: Samson Frederic, MD;  Location: [facility] OR;  Service: Orthopedics;  Laterality: Left;   ARTERIOVENOUS GRAFT PLACEMENT  03/25/2005   Right forearm  w/  multiple Revision's    AV FISTULA PLACEMENT Right 04/13/2022   Procedure: INSERTION OF RIGHT ARTERIOVENOUS (AV) 4-63mm x 45cm GORE-TEX GRAFT;  Surgeon: Chuck Hint, MD;  Location: [facility] OR;  Service: Vascular;  Laterality: Right;   CARDIOVASCULAR STRESS TEST  08/24/2012   abnormal nuclear study/  inferolateral and anteroseptal areas of scar,  no ischemia/  normal LV function and wall motion, ef 77%   CATARACT EXTRACTION W/ INTRAOCULAR LENS IMPLANT Bilateral    cataract removal bilateral, lens implant in right eye   DIALYSIS FISTULA CREATION  09/20/1990   left upper arm ---  w/  Multiple Revision's until 2006   ECTOPIC PREGNANCY SURGERY  05/21/1969   FRACTURE SURGERY     HARDWARE REMOVAL Right 07/10/2020   Procedure: Removal of deep implant right fibula and tibia; steroid injection right ankle;  Surgeon: Toni Arthurs, MD;  Location: [facility] OR;  Service: Orthopedics;  Laterality: Right;   I & D EXTREMITY Left 11/28/2015   Procedure: IRRIGATION AND DEBRIDEMENT LEFT KNEE WOUND ;  Surgeon: Samson Frederic, MD;  Location: [facility] OR;  Service: Orthopedics;  Laterality: Left;   INCISION AND DRAINAGE OF WOUND Left 11/28/2015   knee   INSERTION OF DIALYSIS CATHETER Right 03/07/2023   Procedure: Enis Gash GUIDED INSERTION OF TUNNELED DIALYSIS CATHETER;  Surgeon: Leonie Douglas, MD;  Location: [facility] OR;  Service: Vascular;  Laterality: Right;   IR FLUORO GUIDE CV LINE LEFT  02/09/2022   ORIF PATELLA Left 10/31/2015   Procedure: OPEN REDUCTION INTERNAL (ORIF) FIXATION LEFT PATELLA;  Surgeon: Samson Frederic, MD;  Location: [facility];  Service: Orthopedics;  Laterality: Left;    PATELLAR TENDON REPAIR  10/03/2012   Procedure: PATELLA TENDON REPAIR;  Surgeon: Senaida Lange, MD;  Location: [facility] OR;  Service: Orthopedics;  Laterality: Right;   PATELLECTOMY  10/03/2012   Procedure: PATELLECTOMY;  Surgeon: Senaida Lange, MD;  Location: [facility] OR;  Service: Orthopedics;  Laterality: Right;  RIGHT PARTIAL PATELLECTOMY AND PATELLA TENDON REPAIR   REVISION OF ARTERIOVENOUS GORETEX GRAFT  09/27/2012   Procedure: REVISION OF ARTERIOVENOUS GORETEX GRAFT;  Surgeon: Pryor Ochoa, MD;  Location: [facility] OR;  Service: Vascular;  Laterality: Right;  1) Replacement of venous half of loop with 6mm Gortex graft  2) Excision of erroded pseudoaneurysm of graft with primary closure.   REVISION OF ARTERIOVENOUS GORETEX GRAFT Right 01/23/2013   Procedure: REVISION OF ARTERIOVENOUS GORETEX GRAFT;  Surgeon: Fransisco Hertz, MD;  Location: [facility] OR;  Service: Vascular;  Laterality: Right;  Using piece of 6mm x 20cm Gortex graft.    REVISION OF ARTERIOVENOUS GORETEX GRAFT Right 10/07/2015   Procedure: REVISION OF Right arm ARTERIOVENOUS GORETEX GRAFT;  Surgeon: Sherren Kerns, MD;  Location: [facility] OR;  Service: Vascular;  Laterality: Right;   REVISION OF ARTERIOVENOUS GORETEX GRAFT Right 02/17/2016   Procedure: REVISION OF ARTERIOVENOUS GORETEX GRAFT;  Surgeon: Sherren Kerns, MD;  Location: [facility] OR;  Service: Vascular;  Laterality: Right;   REVISON OF ARTERIOVENOUS FISTULA Right 03/07/2023   Procedure: REVISON OF ARTERIOVENOUS FISTULA ARM;  Surgeon: Leonie Douglas, MD;  Location: [facility] OR;  Service: Vascular;  Laterality: Right;   TOOTH EXTRACTION     10/2021   TOTAL ABDOMINAL HYSTERECTOMY  03/05/2005   w/  Right Ovarian Cystectomy   TOTAL PARATHYROIDECTOMY/  THYROID ISTHMUSECTOMY/  AUTOTRANSPLANTATION PARATHYROID TISSUE TO LEFT BRACHIORIADIALIS MUSCLE  02/18/2011   TRANSTHORACIC ECHOCARDIOGRAM  10/31/2012   mild LVH,  grade 1 diastolic dysfunction,  ef 55-65%/  mild MR/  trivial TR   Family History  Problem  Relation Age of Onset   Healthy Mother    Hypertension Father    Kidney failure Father    Hypertension Sister    Thyroid disease Sister    Social History   Socioeconomic History   Marital status: Legally Separated    Spouse name: Not on file   Number of children: Not on file   Years of education: Not on file   Highest education level: Not on file  Occupational History   Not on file  Tobacco Use   Smoking status: Never    Passive exposure: Never   Smokeless tobacco: Never  Vaping Use   Vaping status: Never Used  Substance and Sexual Activity   Alcohol use: Not Currently    Comment: occasionally drinks a few beers   Drug use: No   Sexual activity: Yes    Birth control/protection: Surgical    Comment: Hysterectomy  Other Topics Concern   Not on file  Social History Narrative   Not on file   Social Determinants of Health   Financial Resource Strain: Low Risk  (07/05/2023)   Overall Financial Resource Strain (CARDIA)    Difficulty of Paying Living Expenses: Not hard at all  Food Insecurity: No Food Insecurity (07/05/2023)   Hunger Vital Sign    Worried About Radiation protection practitioner of Food in the Last Year: Never true    Ran Out of Food in the Last Year: Never true  Transportation Needs: No Transportation Needs (07/05/2023)   PRAPARE - Administrator, Civil Service (Medical): No    Lack of Transportation (Non-Medical): No  Physical Activity: Inactive (07/05/2023)   Exercise Vital Sign    Days of Exercise per Week: 0 days    Minutes of Exercise per Session: 0 min  Stress: No Stress Concern Present (07/05/2023)   [employer] of Occupational Health - Occupational Stress Questionnaire    Feeling of Stress : Not at all  Social Connections: Moderately Isolated (07/05/2023)   Social Connection and Isolation Panel [NHANES]    Frequency of Communication with Friends and Family: More than three times a week    Frequency of Social Gatherings with Friends and Family:  Once a week    Attends Religious Services: 1 to 4 times per year    Active Member of [employer] or Organizations: No    Attends Banker Meetings: Never    Marital Status: Separated    Tobacco Counseling Counseling  given: Not Answered   Clinical Intake:  Pre-visit preparation completed: Yes  Pain : No/denies pain     Nutritional Risks: None Diabetes: No  How often do you need to have someone help you when you read instructions, pamphlets, or other written materials from your doctor or pharmacy?: 1 - Never  Interpreter Needed?: No  Information entered by :: NAllen LPN   Activities of Daily Living    07/05/2023   11:56 AM 03/07/2023    3:22 PM  In your present state of health, do you have any difficulty performing the following activities:  Hearing? 0 0  Vision? 0 0  Difficulty concentrating or making decisions? 0 0  Walking or climbing stairs? 0 0  Dressing or bathing? 0 0  Doing errands, shopping? 0 0  Preparing Food and eating ? N   Using the Toilet? N   In the past six months, have you accidently leaked urine? N   Do you have problems with loss of bowel control? N   Managing your Medications? N   Managing your Finances? N   Housekeeping or managing your Housekeeping? N     Patient Care Team: Nche, Bonna Gains, NP as PCP - General (Internal Medicine) Center, [facility]  Indicate any recent Medical Services you may have received from other than [facility] providers in the past year (date may be approximate).     Assessment:   This is a routine wellness examination for Victoria [name].  Hearing/Vision screen Hearing Screening - Comments:: Denies hearing issues Vision Screening - Comments:: No regular eye exams   Goals Addressed             This Visit's Progress    Patient Stated       07/05/2023, stay healthy       Depression Screen    07/05/2023   12:05 PM 01/14/2023   11:00 AM 07/15/2022    1:41 PM 01/13/2022    1:08 PM 06/24/2020     1:37 PM  PHQ 2/9 Scores  PHQ - 2 Score 0 0 0 0 0  PHQ- 9 Score 0  0      Fall Risk    07/05/2023   12:04 PM 01/14/2023   11:01 AM 01/14/2023   10:27 AM 06/02/2021    8:18 AM 02/23/2021    1:23 PM  Fall Risk   Falls in the past year? 0 0 0 0 0  Number falls in past yr: 0  0 0 0  Injury with Fall? 0  0 0 0  Risk for fall due to : Medication side effect  No Fall Risks No Fall Risks No Fall Risks  Follow up Falls prevention discussed;Falls evaluation completed  Falls evaluation completed Falls evaluation completed Falls evaluation completed    MEDICARE RISK AT HOME: Medicare Risk at Home Any stairs in or around the home?: Yes If so, are there any without handrails?: No Home free of loose throw rugs in walkways, pet beds, electrical cords, etc?: Yes Adequate lighting in your home to reduce risk of falls?: Yes Life alert?: No Use of a cane, walker or w/c?: No Grab bars in the bathroom?: No Shower chair or bench in shower?: Yes Elevated toilet seat or a handicapped toilet?: No  TIMED UP AND GO:  Was the test performed?  No    Cognitive Function:        07/05/2023   12:06 PM  6CIT Screen  What Year? 0 points  What month? 0 points  What time? 0 points  Count back from 20 0 points  Months in reverse 4 points  Repeat phrase 0 points  Total Score 4 points    Immunizations Immunization History  Administered Date(s) Administered   Fluad Quad(high Dose 65+) 07/15/2020, 06/02/2021   Influenza, Quadrivalent, Recombinant, Inj, Pf 07/12/2022   Influenza,inj,Quad PF,6+ Mos 06/29/2019, 06/29/2019   Influenza,inj,quad, With Preservative 06/20/2017   Influenza-Unspecified 07/22/2015   Moderna Sars-Covid-2 Vaccination 11/16/2019, 12/14/2019, 06/20/2020   PFIZER(Purple Top)SARS-COV-2 Vaccination 06/13/2020   Pneumococcal Conjugate-13 01/15/2015   Pneumococcal Polysaccharide-23 07/15/2020   Td 09/29/2012   Td (Adult), 2 Lf Tetanus Toxid, Preservative Free 09/29/2012   Tdap  03/31/2016   Zoster Recombinant(Shingrix) 07/27/2019, 10/24/2019    TDAP status: Up to date  Flu Vaccine status: Due, Education has been provided regarding the importance of this vaccine. Advised may receive this vaccine at local pharmacy or Health Dept. Aware to provide a copy of the vaccination record if obtained from local pharmacy or Health Dept. Verbalized acceptance and understanding.  Pneumococcal vaccine status: Up to date  Covid-19 vaccine status: Information provided on how to obtain vaccines.   Qualifies for Shingles Vaccine? Yes   Zostavax completed Yes   Shingrix Completed?: Yes  Screening Tests Health Maintenance  Topic Date Due   INFLUENZA VACCINE  04/21/2023   COVID-19 Vaccine (5 - 2023-24 season) 05/22/2023   Fecal DNA (Cologuard)  06/13/2023   Medicare Annual Wellness (AWV)  07/04/2024   MAMMOGRAM  07/06/2024   DTaP/Tdap/Td (3 - Td or Tdap) 03/31/2026   Pneumonia Vaccine 60+ Years old  Completed   DEXA SCAN  Completed   Hepatitis C Screening  Completed   Zoster Vaccines- Shingrix  Completed   HPV VACCINES  Aged Out   Colonoscopy  Discontinued    Health Maintenance  Health Maintenance Due  Topic Date Due   INFLUENZA VACCINE  04/21/2023   COVID-19 Vaccine (5 - 2023-24 season) 05/22/2023   Fecal DNA (Cologuard)  06/13/2023    Colorectal cancer screening: has cologuard kit   Mammogram status: scheduled for 07/12/2023  Bone Density status: Completed 07/19/2022.   Lung Cancer Screening: (Low Dose CT Chest recommended if Age 26-80 years, 20 pack-year currently smoking OR have quit w/in 15years.) does not qualify.   Lung Cancer Screening Referral: no  Additional Screening:  Hepatitis C Screening: does qualify; Completed 07/15/2020  Vision Screening: Recommended annual ophthalmology exams for early detection of glaucoma and other disorders of the eye. Is the patient up to date with their annual eye exam?  No  Who is the provider or what is the name  of the office in which the patient attends annual eye exams? none If pt is not established with a provider, would they like to be referred to a provider to establish care? No .   Dental Screening: Recommended annual dental exams for proper oral hygiene  Diabetic Foot Exam: n/a  Community Resource Referral / Chronic Care Management: CRR required this visit?  No   CCM required this visit?  No     Plan:     I have personally reviewed and noted the following in the patient's chart:   Medical and social history Use of alcohol, tobacco or illicit drugs  Current medications and supplements including opioid prescriptions. Patient is not currently taking opioid prescriptions. Functional ability and status Nutritional status Physical activity Advanced directives List of other physicians Hospitalizations, surgeries, and ER visits in previous 12 months Vitals Screenings to include cognitive, depression, and falls  Referrals and appointments  In addition, I have reviewed and discussed with patient certain preventive protocols, quality metrics, and best practice recommendations. A written personalized care plan for preventive services as well as general preventive health recommendations were provided to patient.     Barb Merino, LPN   [id number]/56/[id number]   After Visit Summary: (Pick Up) Due to this being a telephonic visit, with patients personalized plan was offered to patient and patient has requested to Pick up at office.  Nurse Notes: none

## 2023-07-06 DIAGNOSIS — Z992 Dependence on renal dialysis: Secondary | ICD-10-CM | POA: Diagnosis not present

## 2023-07-06 DIAGNOSIS — D631 Anemia in chronic kidney disease: Secondary | ICD-10-CM | POA: Diagnosis not present

## 2023-07-06 DIAGNOSIS — Z23 Encounter for immunization: Secondary | ICD-10-CM | POA: Diagnosis not present

## 2023-07-06 DIAGNOSIS — N2581 Secondary hyperparathyroidism of renal origin: Secondary | ICD-10-CM | POA: Diagnosis not present

## 2023-07-06 DIAGNOSIS — N186 End stage renal disease: Secondary | ICD-10-CM | POA: Diagnosis not present

## 2023-07-06 DIAGNOSIS — D688 Other specified coagulation defects: Secondary | ICD-10-CM | POA: Diagnosis not present

## 2023-07-08 DIAGNOSIS — Z23 Encounter for immunization: Secondary | ICD-10-CM | POA: Diagnosis not present

## 2023-07-08 DIAGNOSIS — N2581 Secondary hyperparathyroidism of renal origin: Secondary | ICD-10-CM | POA: Diagnosis not present

## 2023-07-08 DIAGNOSIS — D688 Other specified coagulation defects: Secondary | ICD-10-CM | POA: Diagnosis not present

## 2023-07-08 DIAGNOSIS — Z992 Dependence on renal dialysis: Secondary | ICD-10-CM | POA: Diagnosis not present

## 2023-07-08 DIAGNOSIS — N186 End stage renal disease: Secondary | ICD-10-CM | POA: Diagnosis not present

## 2023-07-08 DIAGNOSIS — D631 Anemia in chronic kidney disease: Secondary | ICD-10-CM | POA: Diagnosis not present

## 2023-07-11 DIAGNOSIS — N186 End stage renal disease: Secondary | ICD-10-CM | POA: Diagnosis not present

## 2023-07-11 DIAGNOSIS — D688 Other specified coagulation defects: Secondary | ICD-10-CM | POA: Diagnosis not present

## 2023-07-11 DIAGNOSIS — N2581 Secondary hyperparathyroidism of renal origin: Secondary | ICD-10-CM | POA: Diagnosis not present

## 2023-07-11 DIAGNOSIS — D631 Anemia in chronic kidney disease: Secondary | ICD-10-CM | POA: Diagnosis not present

## 2023-07-11 DIAGNOSIS — Z23 Encounter for immunization: Secondary | ICD-10-CM | POA: Diagnosis not present

## 2023-07-11 DIAGNOSIS — Z992 Dependence on renal dialysis: Secondary | ICD-10-CM | POA: Diagnosis not present

## 2023-07-12 ENCOUNTER — Ambulatory Visit
Admission: RE | Admit: 2023-07-12 | Discharge: 2023-07-12 | Disposition: A | Discharge status: Home / Self Care | Payer: 59 | Source: Ambulatory Visit | Attending: Nurse Practitioner | Admitting: Nurse Practitioner

## 2023-07-12 DIAGNOSIS — Z1231 Encounter for screening mammogram for malignant neoplasm of breast: Secondary | ICD-10-CM

## 2023-07-13 DIAGNOSIS — D631 Anemia in chronic kidney disease: Secondary | ICD-10-CM | POA: Diagnosis not present

## 2023-07-13 DIAGNOSIS — N186 End stage renal disease: Secondary | ICD-10-CM | POA: Diagnosis not present

## 2023-07-13 DIAGNOSIS — Z992 Dependence on renal dialysis: Secondary | ICD-10-CM | POA: Diagnosis not present

## 2023-07-13 DIAGNOSIS — Z23 Encounter for immunization: Secondary | ICD-10-CM | POA: Diagnosis not present

## 2023-07-13 DIAGNOSIS — D688 Other specified coagulation defects: Secondary | ICD-10-CM | POA: Diagnosis not present

## 2023-07-13 DIAGNOSIS — N2581 Secondary hyperparathyroidism of renal origin: Secondary | ICD-10-CM | POA: Diagnosis not present

## 2023-07-15 DIAGNOSIS — Z23 Encounter for immunization: Secondary | ICD-10-CM | POA: Diagnosis not present

## 2023-07-15 DIAGNOSIS — N2581 Secondary hyperparathyroidism of renal origin: Secondary | ICD-10-CM | POA: Diagnosis not present

## 2023-07-15 DIAGNOSIS — N186 End stage renal disease: Secondary | ICD-10-CM | POA: Diagnosis not present

## 2023-07-15 DIAGNOSIS — D688 Other specified coagulation defects: Secondary | ICD-10-CM | POA: Diagnosis not present

## 2023-07-15 DIAGNOSIS — D631 Anemia in chronic kidney disease: Secondary | ICD-10-CM | POA: Diagnosis not present

## 2023-07-15 DIAGNOSIS — Z992 Dependence on renal dialysis: Secondary | ICD-10-CM | POA: Diagnosis not present

## 2023-07-18 DIAGNOSIS — D688 Other specified coagulation defects: Secondary | ICD-10-CM | POA: Diagnosis not present

## 2023-07-18 DIAGNOSIS — N2581 Secondary hyperparathyroidism of renal origin: Secondary | ICD-10-CM | POA: Diagnosis not present

## 2023-07-18 DIAGNOSIS — Z992 Dependence on renal dialysis: Secondary | ICD-10-CM | POA: Diagnosis not present

## 2023-07-18 DIAGNOSIS — D631 Anemia in chronic kidney disease: Secondary | ICD-10-CM | POA: Diagnosis not present

## 2023-07-18 DIAGNOSIS — Z23 Encounter for immunization: Secondary | ICD-10-CM | POA: Diagnosis not present

## 2023-07-18 DIAGNOSIS — N186 End stage renal disease: Secondary | ICD-10-CM | POA: Diagnosis not present

## 2023-07-20 DIAGNOSIS — N2581 Secondary hyperparathyroidism of renal origin: Secondary | ICD-10-CM | POA: Diagnosis not present

## 2023-07-20 DIAGNOSIS — N186 End stage renal disease: Secondary | ICD-10-CM | POA: Diagnosis not present

## 2023-07-20 DIAGNOSIS — Z23 Encounter for immunization: Secondary | ICD-10-CM | POA: Diagnosis not present

## 2023-07-20 DIAGNOSIS — Z992 Dependence on renal dialysis: Secondary | ICD-10-CM | POA: Diagnosis not present

## 2023-07-20 DIAGNOSIS — D688 Other specified coagulation defects: Secondary | ICD-10-CM | POA: Diagnosis not present

## 2023-07-20 DIAGNOSIS — D631 Anemia in chronic kidney disease: Secondary | ICD-10-CM | POA: Diagnosis not present

## 2023-07-21 DIAGNOSIS — N186 End stage renal disease: Secondary | ICD-10-CM | POA: Diagnosis not present

## 2023-07-21 DIAGNOSIS — Z992 Dependence on renal dialysis: Secondary | ICD-10-CM | POA: Diagnosis not present

## 2023-07-21 DIAGNOSIS — I120 Hypertensive chronic kidney disease with stage 5 chronic kidney disease or end stage renal disease: Secondary | ICD-10-CM | POA: Diagnosis not present

## 2023-07-22 DIAGNOSIS — N186 End stage renal disease: Secondary | ICD-10-CM | POA: Diagnosis not present

## 2023-07-22 DIAGNOSIS — D688 Other specified coagulation defects: Secondary | ICD-10-CM | POA: Diagnosis not present

## 2023-07-22 DIAGNOSIS — Z992 Dependence on renal dialysis: Secondary | ICD-10-CM | POA: Diagnosis not present

## 2023-07-22 DIAGNOSIS — N2581 Secondary hyperparathyroidism of renal origin: Secondary | ICD-10-CM | POA: Diagnosis not present

## 2023-07-22 DIAGNOSIS — D631 Anemia in chronic kidney disease: Secondary | ICD-10-CM | POA: Diagnosis not present

## 2023-07-25 DIAGNOSIS — N2581 Secondary hyperparathyroidism of renal origin: Secondary | ICD-10-CM | POA: Diagnosis not present

## 2023-07-25 DIAGNOSIS — D688 Other specified coagulation defects: Secondary | ICD-10-CM | POA: Diagnosis not present

## 2023-07-25 DIAGNOSIS — Z992 Dependence on renal dialysis: Secondary | ICD-10-CM | POA: Diagnosis not present

## 2023-07-25 DIAGNOSIS — N186 End stage renal disease: Secondary | ICD-10-CM | POA: Diagnosis not present

## 2023-07-25 DIAGNOSIS — D631 Anemia in chronic kidney disease: Secondary | ICD-10-CM | POA: Diagnosis not present

## 2023-07-27 DIAGNOSIS — D688 Other specified coagulation defects: Secondary | ICD-10-CM | POA: Diagnosis not present

## 2023-07-27 DIAGNOSIS — D631 Anemia in chronic kidney disease: Secondary | ICD-10-CM | POA: Diagnosis not present

## 2023-07-27 DIAGNOSIS — Z992 Dependence on renal dialysis: Secondary | ICD-10-CM | POA: Diagnosis not present

## 2023-07-27 DIAGNOSIS — N2581 Secondary hyperparathyroidism of renal origin: Secondary | ICD-10-CM | POA: Diagnosis not present

## 2023-07-27 DIAGNOSIS — N186 End stage renal disease: Secondary | ICD-10-CM | POA: Diagnosis not present

## 2023-07-29 DIAGNOSIS — D631 Anemia in chronic kidney disease: Secondary | ICD-10-CM | POA: Diagnosis not present

## 2023-07-29 DIAGNOSIS — N2581 Secondary hyperparathyroidism of renal origin: Secondary | ICD-10-CM | POA: Diagnosis not present

## 2023-07-29 DIAGNOSIS — D688 Other specified coagulation defects: Secondary | ICD-10-CM | POA: Diagnosis not present

## 2023-07-29 DIAGNOSIS — Z992 Dependence on renal dialysis: Secondary | ICD-10-CM | POA: Diagnosis not present

## 2023-07-29 DIAGNOSIS — N186 End stage renal disease: Secondary | ICD-10-CM | POA: Diagnosis not present

## 2023-08-01 DIAGNOSIS — N2581 Secondary hyperparathyroidism of renal origin: Secondary | ICD-10-CM | POA: Diagnosis not present

## 2023-08-01 DIAGNOSIS — D688 Other specified coagulation defects: Secondary | ICD-10-CM | POA: Diagnosis not present

## 2023-08-01 DIAGNOSIS — D631 Anemia in chronic kidney disease: Secondary | ICD-10-CM | POA: Diagnosis not present

## 2023-08-01 DIAGNOSIS — Z992 Dependence on renal dialysis: Secondary | ICD-10-CM | POA: Diagnosis not present

## 2023-08-01 DIAGNOSIS — N186 End stage renal disease: Secondary | ICD-10-CM | POA: Diagnosis not present

## 2023-08-03 DIAGNOSIS — Z992 Dependence on renal dialysis: Secondary | ICD-10-CM | POA: Diagnosis not present

## 2023-08-03 DIAGNOSIS — D688 Other specified coagulation defects: Secondary | ICD-10-CM | POA: Diagnosis not present

## 2023-08-03 DIAGNOSIS — N186 End stage renal disease: Secondary | ICD-10-CM | POA: Diagnosis not present

## 2023-08-03 DIAGNOSIS — D631 Anemia in chronic kidney disease: Secondary | ICD-10-CM | POA: Diagnosis not present

## 2023-08-03 DIAGNOSIS — N2581 Secondary hyperparathyroidism of renal origin: Secondary | ICD-10-CM | POA: Diagnosis not present

## 2023-08-05 DIAGNOSIS — D631 Anemia in chronic kidney disease: Secondary | ICD-10-CM | POA: Diagnosis not present

## 2023-08-05 DIAGNOSIS — Z992 Dependence on renal dialysis: Secondary | ICD-10-CM | POA: Diagnosis not present

## 2023-08-05 DIAGNOSIS — N186 End stage renal disease: Secondary | ICD-10-CM | POA: Diagnosis not present

## 2023-08-05 DIAGNOSIS — D688 Other specified coagulation defects: Secondary | ICD-10-CM | POA: Diagnosis not present

## 2023-08-05 DIAGNOSIS — N2581 Secondary hyperparathyroidism of renal origin: Secondary | ICD-10-CM | POA: Diagnosis not present

## 2023-08-08 DIAGNOSIS — N186 End stage renal disease: Secondary | ICD-10-CM | POA: Diagnosis not present

## 2023-08-08 DIAGNOSIS — D631 Anemia in chronic kidney disease: Secondary | ICD-10-CM | POA: Diagnosis not present

## 2023-08-08 DIAGNOSIS — D688 Other specified coagulation defects: Secondary | ICD-10-CM | POA: Diagnosis not present

## 2023-08-08 DIAGNOSIS — Z992 Dependence on renal dialysis: Secondary | ICD-10-CM | POA: Diagnosis not present

## 2023-08-08 DIAGNOSIS — N2581 Secondary hyperparathyroidism of renal origin: Secondary | ICD-10-CM | POA: Diagnosis not present

## 2023-08-10 DIAGNOSIS — D688 Other specified coagulation defects: Secondary | ICD-10-CM | POA: Diagnosis not present

## 2023-08-10 DIAGNOSIS — N2581 Secondary hyperparathyroidism of renal origin: Secondary | ICD-10-CM | POA: Diagnosis not present

## 2023-08-10 DIAGNOSIS — D631 Anemia in chronic kidney disease: Secondary | ICD-10-CM | POA: Diagnosis not present

## 2023-08-10 DIAGNOSIS — Z992 Dependence on renal dialysis: Secondary | ICD-10-CM | POA: Diagnosis not present

## 2023-08-10 DIAGNOSIS — N186 End stage renal disease: Secondary | ICD-10-CM | POA: Diagnosis not present

## 2023-08-12 DIAGNOSIS — N2581 Secondary hyperparathyroidism of renal origin: Secondary | ICD-10-CM | POA: Diagnosis not present

## 2023-08-12 DIAGNOSIS — Z992 Dependence on renal dialysis: Secondary | ICD-10-CM | POA: Diagnosis not present

## 2023-08-12 DIAGNOSIS — D688 Other specified coagulation defects: Secondary | ICD-10-CM | POA: Diagnosis not present

## 2023-08-12 DIAGNOSIS — D631 Anemia in chronic kidney disease: Secondary | ICD-10-CM | POA: Diagnosis not present

## 2023-08-12 DIAGNOSIS — N186 End stage renal disease: Secondary | ICD-10-CM | POA: Diagnosis not present

## 2023-08-14 DIAGNOSIS — N2581 Secondary hyperparathyroidism of renal origin: Secondary | ICD-10-CM | POA: Diagnosis not present

## 2023-08-14 DIAGNOSIS — Z992 Dependence on renal dialysis: Secondary | ICD-10-CM | POA: Diagnosis not present

## 2023-08-14 DIAGNOSIS — D688 Other specified coagulation defects: Secondary | ICD-10-CM | POA: Diagnosis not present

## 2023-08-14 DIAGNOSIS — N186 End stage renal disease: Secondary | ICD-10-CM | POA: Diagnosis not present

## 2023-08-14 DIAGNOSIS — D631 Anemia in chronic kidney disease: Secondary | ICD-10-CM | POA: Diagnosis not present

## 2023-08-16 DIAGNOSIS — N2581 Secondary hyperparathyroidism of renal origin: Secondary | ICD-10-CM | POA: Diagnosis not present

## 2023-08-16 DIAGNOSIS — D631 Anemia in chronic kidney disease: Secondary | ICD-10-CM | POA: Diagnosis not present

## 2023-08-16 DIAGNOSIS — D688 Other specified coagulation defects: Secondary | ICD-10-CM | POA: Diagnosis not present

## 2023-08-16 DIAGNOSIS — Z992 Dependence on renal dialysis: Secondary | ICD-10-CM | POA: Diagnosis not present

## 2023-08-16 DIAGNOSIS — N186 End stage renal disease: Secondary | ICD-10-CM | POA: Diagnosis not present

## 2023-08-19 DIAGNOSIS — D631 Anemia in chronic kidney disease: Secondary | ICD-10-CM | POA: Diagnosis not present

## 2023-08-19 DIAGNOSIS — N2581 Secondary hyperparathyroidism of renal origin: Secondary | ICD-10-CM | POA: Diagnosis not present

## 2023-08-19 DIAGNOSIS — D688 Other specified coagulation defects: Secondary | ICD-10-CM | POA: Diagnosis not present

## 2023-08-19 DIAGNOSIS — N186 End stage renal disease: Secondary | ICD-10-CM | POA: Diagnosis not present

## 2023-08-19 DIAGNOSIS — Z992 Dependence on renal dialysis: Secondary | ICD-10-CM | POA: Diagnosis not present

## 2023-08-20 DIAGNOSIS — Z992 Dependence on renal dialysis: Secondary | ICD-10-CM | POA: Diagnosis not present

## 2023-08-20 DIAGNOSIS — I120 Hypertensive chronic kidney disease with stage 5 chronic kidney disease or end stage renal disease: Secondary | ICD-10-CM | POA: Diagnosis not present

## 2023-08-20 DIAGNOSIS — N186 End stage renal disease: Secondary | ICD-10-CM | POA: Diagnosis not present

## 2023-08-22 DIAGNOSIS — Z992 Dependence on renal dialysis: Secondary | ICD-10-CM | POA: Diagnosis not present

## 2023-08-22 DIAGNOSIS — D688 Other specified coagulation defects: Secondary | ICD-10-CM | POA: Diagnosis not present

## 2023-08-22 DIAGNOSIS — N2581 Secondary hyperparathyroidism of renal origin: Secondary | ICD-10-CM | POA: Diagnosis not present

## 2023-08-22 DIAGNOSIS — N186 End stage renal disease: Secondary | ICD-10-CM | POA: Diagnosis not present

## 2023-08-22 DIAGNOSIS — D631 Anemia in chronic kidney disease: Secondary | ICD-10-CM | POA: Diagnosis not present

## 2023-08-24 DIAGNOSIS — N186 End stage renal disease: Secondary | ICD-10-CM | POA: Diagnosis not present

## 2023-08-24 DIAGNOSIS — D688 Other specified coagulation defects: Secondary | ICD-10-CM | POA: Diagnosis not present

## 2023-08-24 DIAGNOSIS — N2581 Secondary hyperparathyroidism of renal origin: Secondary | ICD-10-CM | POA: Diagnosis not present

## 2023-08-24 DIAGNOSIS — Z992 Dependence on renal dialysis: Secondary | ICD-10-CM | POA: Diagnosis not present

## 2023-08-24 DIAGNOSIS — D631 Anemia in chronic kidney disease: Secondary | ICD-10-CM | POA: Diagnosis not present

## 2023-08-26 DIAGNOSIS — N186 End stage renal disease: Secondary | ICD-10-CM | POA: Diagnosis not present

## 2023-08-26 DIAGNOSIS — D688 Other specified coagulation defects: Secondary | ICD-10-CM | POA: Diagnosis not present

## 2023-08-26 DIAGNOSIS — Z992 Dependence on renal dialysis: Secondary | ICD-10-CM | POA: Diagnosis not present

## 2023-08-26 DIAGNOSIS — D631 Anemia in chronic kidney disease: Secondary | ICD-10-CM | POA: Diagnosis not present

## 2023-08-26 DIAGNOSIS — N2581 Secondary hyperparathyroidism of renal origin: Secondary | ICD-10-CM | POA: Diagnosis not present

## 2023-08-29 DIAGNOSIS — N186 End stage renal disease: Secondary | ICD-10-CM | POA: Diagnosis not present

## 2023-08-29 DIAGNOSIS — D631 Anemia in chronic kidney disease: Secondary | ICD-10-CM | POA: Diagnosis not present

## 2023-08-29 DIAGNOSIS — D688 Other specified coagulation defects: Secondary | ICD-10-CM | POA: Diagnosis not present

## 2023-08-29 DIAGNOSIS — Z992 Dependence on renal dialysis: Secondary | ICD-10-CM | POA: Diagnosis not present

## 2023-08-29 DIAGNOSIS — N2581 Secondary hyperparathyroidism of renal origin: Secondary | ICD-10-CM | POA: Diagnosis not present

## 2023-08-31 DIAGNOSIS — Z992 Dependence on renal dialysis: Secondary | ICD-10-CM | POA: Diagnosis not present

## 2023-08-31 DIAGNOSIS — D688 Other specified coagulation defects: Secondary | ICD-10-CM | POA: Diagnosis not present

## 2023-08-31 DIAGNOSIS — D631 Anemia in chronic kidney disease: Secondary | ICD-10-CM | POA: Diagnosis not present

## 2023-08-31 DIAGNOSIS — N2581 Secondary hyperparathyroidism of renal origin: Secondary | ICD-10-CM | POA: Diagnosis not present

## 2023-08-31 DIAGNOSIS — N186 End stage renal disease: Secondary | ICD-10-CM | POA: Diagnosis not present

## 2023-09-02 DIAGNOSIS — D631 Anemia in chronic kidney disease: Secondary | ICD-10-CM | POA: Diagnosis not present

## 2023-09-02 DIAGNOSIS — N186 End stage renal disease: Secondary | ICD-10-CM | POA: Diagnosis not present

## 2023-09-02 DIAGNOSIS — N2581 Secondary hyperparathyroidism of renal origin: Secondary | ICD-10-CM | POA: Diagnosis not present

## 2023-09-02 DIAGNOSIS — D688 Other specified coagulation defects: Secondary | ICD-10-CM | POA: Diagnosis not present

## 2023-09-02 DIAGNOSIS — Z992 Dependence on renal dialysis: Secondary | ICD-10-CM | POA: Diagnosis not present

## 2023-09-05 DIAGNOSIS — D688 Other specified coagulation defects: Secondary | ICD-10-CM | POA: Diagnosis not present

## 2023-09-05 DIAGNOSIS — Z992 Dependence on renal dialysis: Secondary | ICD-10-CM | POA: Diagnosis not present

## 2023-09-05 DIAGNOSIS — N186 End stage renal disease: Secondary | ICD-10-CM | POA: Diagnosis not present

## 2023-09-05 DIAGNOSIS — D631 Anemia in chronic kidney disease: Secondary | ICD-10-CM | POA: Diagnosis not present

## 2023-09-05 DIAGNOSIS — N2581 Secondary hyperparathyroidism of renal origin: Secondary | ICD-10-CM | POA: Diagnosis not present

## 2023-09-07 DIAGNOSIS — D631 Anemia in chronic kidney disease: Secondary | ICD-10-CM | POA: Diagnosis not present

## 2023-09-07 DIAGNOSIS — N186 End stage renal disease: Secondary | ICD-10-CM | POA: Diagnosis not present

## 2023-09-07 DIAGNOSIS — Z992 Dependence on renal dialysis: Secondary | ICD-10-CM | POA: Diagnosis not present

## 2023-09-07 DIAGNOSIS — D688 Other specified coagulation defects: Secondary | ICD-10-CM | POA: Diagnosis not present

## 2023-09-07 DIAGNOSIS — N2581 Secondary hyperparathyroidism of renal origin: Secondary | ICD-10-CM | POA: Diagnosis not present

## 2023-09-09 DIAGNOSIS — Z992 Dependence on renal dialysis: Secondary | ICD-10-CM | POA: Diagnosis not present

## 2023-09-09 DIAGNOSIS — D688 Other specified coagulation defects: Secondary | ICD-10-CM | POA: Diagnosis not present

## 2023-09-09 DIAGNOSIS — D631 Anemia in chronic kidney disease: Secondary | ICD-10-CM | POA: Diagnosis not present

## 2023-09-09 DIAGNOSIS — N186 End stage renal disease: Secondary | ICD-10-CM | POA: Diagnosis not present

## 2023-09-09 DIAGNOSIS — N2581 Secondary hyperparathyroidism of renal origin: Secondary | ICD-10-CM | POA: Diagnosis not present

## 2023-09-11 DIAGNOSIS — D688 Other specified coagulation defects: Secondary | ICD-10-CM | POA: Diagnosis not present

## 2023-09-11 DIAGNOSIS — Z992 Dependence on renal dialysis: Secondary | ICD-10-CM | POA: Diagnosis not present

## 2023-09-11 DIAGNOSIS — D631 Anemia in chronic kidney disease: Secondary | ICD-10-CM | POA: Diagnosis not present

## 2023-09-11 DIAGNOSIS — N2581 Secondary hyperparathyroidism of renal origin: Secondary | ICD-10-CM | POA: Diagnosis not present

## 2023-09-11 DIAGNOSIS — N186 End stage renal disease: Secondary | ICD-10-CM | POA: Diagnosis not present

## 2023-09-13 DIAGNOSIS — Z992 Dependence on renal dialysis: Secondary | ICD-10-CM | POA: Diagnosis not present

## 2023-09-13 DIAGNOSIS — D688 Other specified coagulation defects: Secondary | ICD-10-CM | POA: Diagnosis not present

## 2023-09-13 DIAGNOSIS — D631 Anemia in chronic kidney disease: Secondary | ICD-10-CM | POA: Diagnosis not present

## 2023-09-13 DIAGNOSIS — N186 End stage renal disease: Secondary | ICD-10-CM | POA: Diagnosis not present

## 2023-09-13 DIAGNOSIS — N2581 Secondary hyperparathyroidism of renal origin: Secondary | ICD-10-CM | POA: Diagnosis not present

## 2023-09-16 DIAGNOSIS — D631 Anemia in chronic kidney disease: Secondary | ICD-10-CM | POA: Diagnosis not present

## 2023-09-16 DIAGNOSIS — N186 End stage renal disease: Secondary | ICD-10-CM | POA: Diagnosis not present

## 2023-09-16 DIAGNOSIS — D688 Other specified coagulation defects: Secondary | ICD-10-CM | POA: Diagnosis not present

## 2023-09-16 DIAGNOSIS — N2581 Secondary hyperparathyroidism of renal origin: Secondary | ICD-10-CM | POA: Diagnosis not present

## 2023-09-16 DIAGNOSIS — Z992 Dependence on renal dialysis: Secondary | ICD-10-CM | POA: Diagnosis not present

## 2023-09-18 DIAGNOSIS — D631 Anemia in chronic kidney disease: Secondary | ICD-10-CM | POA: Diagnosis not present

## 2023-09-18 DIAGNOSIS — D688 Other specified coagulation defects: Secondary | ICD-10-CM | POA: Diagnosis not present

## 2023-09-18 DIAGNOSIS — Z992 Dependence on renal dialysis: Secondary | ICD-10-CM | POA: Diagnosis not present

## 2023-09-18 DIAGNOSIS — N186 End stage renal disease: Secondary | ICD-10-CM | POA: Diagnosis not present

## 2023-09-18 DIAGNOSIS — N2581 Secondary hyperparathyroidism of renal origin: Secondary | ICD-10-CM | POA: Diagnosis not present

## 2023-09-20 DIAGNOSIS — N2581 Secondary hyperparathyroidism of renal origin: Secondary | ICD-10-CM | POA: Diagnosis not present

## 2023-09-20 DIAGNOSIS — I120 Hypertensive chronic kidney disease with stage 5 chronic kidney disease or end stage renal disease: Secondary | ICD-10-CM | POA: Diagnosis not present

## 2023-09-20 DIAGNOSIS — D631 Anemia in chronic kidney disease: Secondary | ICD-10-CM | POA: Diagnosis not present

## 2023-09-20 DIAGNOSIS — N186 End stage renal disease: Secondary | ICD-10-CM | POA: Diagnosis not present

## 2023-09-20 DIAGNOSIS — Z992 Dependence on renal dialysis: Secondary | ICD-10-CM | POA: Diagnosis not present

## 2023-09-20 DIAGNOSIS — D688 Other specified coagulation defects: Secondary | ICD-10-CM | POA: Diagnosis not present

## 2023-09-23 ENCOUNTER — Encounter (HOSPITAL_COMMUNITY): Payer: Self-pay

## 2023-09-23 ENCOUNTER — Ambulatory Visit (HOSPITAL_COMMUNITY)
Admission: EM | Admit: 2023-09-23 | Discharge: 2023-09-23 | Disposition: A | Discharge status: Home / Self Care | Payer: 59 | Attending: Family Medicine | Admitting: Family Medicine

## 2023-09-23 DIAGNOSIS — N186 End stage renal disease: Secondary | ICD-10-CM | POA: Diagnosis not present

## 2023-09-23 DIAGNOSIS — D688 Other specified coagulation defects: Secondary | ICD-10-CM | POA: Diagnosis not present

## 2023-09-23 DIAGNOSIS — J019 Acute sinusitis, unspecified: Secondary | ICD-10-CM | POA: Diagnosis not present

## 2023-09-23 DIAGNOSIS — D631 Anemia in chronic kidney disease: Secondary | ICD-10-CM | POA: Diagnosis not present

## 2023-09-23 DIAGNOSIS — Z992 Dependence on renal dialysis: Secondary | ICD-10-CM | POA: Diagnosis not present

## 2023-09-23 DIAGNOSIS — N2581 Secondary hyperparathyroidism of renal origin: Secondary | ICD-10-CM | POA: Diagnosis not present

## 2023-09-23 MED ORDER — DOXYCYCLINE HYCLATE 100 MG PO CAPS: 100.0000 mg | ORAL_CAPSULE | Freq: Two times a day (BID) | ORAL | 0 refills | Status: AC

## 2023-09-23 MED ORDER — BENZONATATE 100 MG PO CAPS: 100.0000 mg | ORAL_CAPSULE | Freq: Three times a day (TID) | ORAL | 0 refills | Status: DC | PRN

## 2023-09-23 NOTE — ED Provider Notes (Addendum)
 [facility]    CSN: [id number] Arrival date & time: 09/23/23  0935      History   Chief Complaint Chief Complaint  Patient presents with   Sore Throat    HPI Victoria [name] is a 68 y.o. female.    Sore Throat  Here for postnasal drainage and scratchy throat, rhinorrhea and nasal congestion, and cough.  No fever at any point.  Symptoms have been going on for about a month.  No history of asthma reported  She does have a history of end-stage renal disease on dialysis and she comes from dialysis earlier this morning.  Her blood pressure is 88/58 here but she is without any dizziness  Allergies include cephalosporins which cause anaphylaxis.  Lisinopril caused swelling and amlodipine caused swelling.  Past Medical History:  Diagnosis Date   Arthritis    Chronic bronchitis (HCC)    Complication of anesthesia ~ 2011   they gave me a medicine that swolled me and mouth burning up (08/23/2012)   Complication of anesthesia    slow to wake up   ESRD (end stage renal disease) on dialysis [facility]) Nephrologist-- dr deterding   ESRD due to HTN-; M/W/F; [address] (11/28/2015   GERD (gastroesophageal reflux disease)    no meds, diet controlled   History of acute pulmonary edema    2003   Hypertension    no longer on medications   Left patella fracture    Pneumonia    as a child   Seasonal allergies    Secondary hyperparathyroidism, renal (HCC)    s/p  total parathyroidectomy  2014   Thyroid  disease    Wears glasses    Wound dehiscence, surgical 11/28/2015    Patient Active Problem List   Diagnosis Date Noted   Class 1 obesity due to excess calories with body mass index (BMI) of 31.0 to 31.9 in adult 03/08/2023   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) 03/07/2023   Leukocytosis 03/07/2023   Complication of vascular dialysis catheter 01/20/2022   ESRD on dialysis (HCC) 01/13/2022   S/P total parathyroidectomy 01/13/2022   Aortic  atherosclerosis (HCC) 02/23/2021   Bilateral carpal tunnel syndrome 02/26/2019   Numbness 02/26/2019   Pain 02/26/2019   Trigger index finger of right hand 02/26/2019   Bilateral sensorineural hearing loss 01/16/2019   Subjective tinnitus, bilateral 01/16/2019   Deficiency of macronutrients 10/04/2013   Mechanical complication of other vascular device, implant, and graft 01/12/2013   Hyperlipidemia 10/25/2012   Thyroid  nodule 03/22/2011   Hypotension of hemodialysis 03/10/2011   Anemia in chronic kidney disease 06/28/2001   Iron  deficiency anemia 06/28/2001    Past Surgical History:  Procedure Laterality Date   ANKLE FRACTURE SURGERY Right ~ 2005   has pins in it   APPLICATION OF WOUND VAC Left 11/28/2015   knee   APPLICATION OF WOUND VAC Left 11/28/2015   Procedure: APPLICATION OF WOUND VAC;  Surgeon: Redell Shoals, MD;  [facility];  Service: Orthopedics;  Laterality: Left;   ARTERIOVENOUS GRAFT PLACEMENT  03/25/2005   Right forearm  w/  multiple Revision's    AV FISTULA PLACEMENT Right 04/13/2022   Procedure: INSERTION OF RIGHT ARTERIOVENOUS (AV) 4-74mm x 45cm GORE-TEX GRAFT;  Surgeon: Eliza Lonni RAMAN, MD;  Location: [facility] OR;  Service: Vascular;  Laterality: Right;   CARDIOVASCULAR STRESS TEST  08/24/2012   abnormal nuclear study/  inferolateral and anteroseptal areas of scar,  no ischemia/  normal LV function and  wall motion, ef 77%   CATARACT EXTRACTION W/ INTRAOCULAR LENS IMPLANT Bilateral    cataract removal bilateral, lens implant in right eye   DIALYSIS FISTULA CREATION  09/20/1990   left upper arm ---  w/  Multiple Revision's until 2006   ECTOPIC PREGNANCY SURGERY  05/21/1969   FRACTURE SURGERY     HARDWARE REMOVAL Right 07/10/2020   Procedure: Removal of deep implant right fibula and tibia; steroid injection right ankle;  Surgeon: Kit Rush, MD;  Location: [facility] OR;  Service: Orthopedics;  Laterality: Right;   I & D EXTREMITY Left 11/28/2015   Procedure:  IRRIGATION AND DEBRIDEMENT LEFT KNEE WOUND ;  Surgeon: Redell Shoals, MD;  [facility];  Service: Orthopedics;  Laterality: Left;   INCISION AND DRAINAGE OF WOUND Left 11/28/2015   knee   INSERTION OF DIALYSIS CATHETER Right 03/07/2023   Procedure: JERYL GUIDED INSERTION OF TUNNELED DIALYSIS CATHETER;  Surgeon: Magda Debby SAILOR, MD;  Location: [facility] OR;  Service: Vascular;  Laterality: Right;   IR FLUORO GUIDE CV LINE LEFT  02/09/2022   ORIF PATELLA Left 10/31/2015   Procedure: OPEN REDUCTION INTERNAL (ORIF) FIXATION LEFT PATELLA;  Surgeon: Redell Shoals, MD;  Location: [facility];  Service: Orthopedics;  Laterality: Left;   PATELLAR TENDON REPAIR  10/03/2012   Procedure: PATELLA TENDON REPAIR;  Surgeon: Franky CHRISTELLA Pointer, MD;  [facility];  Service: Orthopedics;  Laterality: Right;   PATELLECTOMY  10/03/2012   Procedure: PATELLECTOMY;  Surgeon: Franky CHRISTELLA Pointer, MD;  [facility];  Service: Orthopedics;  Laterality: Right;  RIGHT PARTIAL PATELLECTOMY AND PATELLA TENDON REPAIR   REVISION OF ARTERIOVENOUS GORETEX GRAFT  09/27/2012   Procedure: REVISION OF ARTERIOVENOUS GORETEX GRAFT;  Surgeon: Lynwood JONETTA Collum, MD;  Location: [facility] OR;  Service: Vascular;  Laterality: Right;  1) Replacement of venous half of loop with 6mm Gortex graft  2) Excision of erroded pseudoaneurysm of graft with primary closure.   REVISION OF ARTERIOVENOUS GORETEX GRAFT Right 01/23/2013   Procedure: REVISION OF ARTERIOVENOUS GORETEX GRAFT;  Surgeon: Redell LITTIE Door, MD;  Location: [facility] OR;  Service: Vascular;  Laterality: Right;  Using piece of 6mm x 20cm Gortex graft.    REVISION OF ARTERIOVENOUS GORETEX GRAFT Right 10/07/2015   Procedure: REVISION OF Right arm ARTERIOVENOUS GORETEX GRAFT;  Surgeon: Carlin FORBES Haddock, MD;  Location: [facility] OR;  Service: Vascular;  Laterality: Right;   REVISION OF ARTERIOVENOUS GORETEX GRAFT Right 02/17/2016   Procedure: REVISION OF ARTERIOVENOUS GORETEX GRAFT;  Surgeon: Carlin FORBES Haddock, MD;  Location: [facility] OR;  Service: Vascular;  Laterality: Right;   REVISON OF ARTERIOVENOUS FISTULA Right 03/07/2023   Procedure: REVISON OF ARTERIOVENOUS FISTULA ARM;  Surgeon: Magda Debby SAILOR, MD;  [facility];  Service: Vascular;  Laterality: Right;   TOOTH EXTRACTION     10/2021   TOTAL ABDOMINAL HYSTERECTOMY  03/05/2005   w/  Right Ovarian Cystectomy   TOTAL PARATHYROIDECTOMY/  THYROID  ISTHMUSECTOMY/  AUTOTRANSPLANTATION PARATHYROID  TISSUE TO LEFT BRACHIORIADIALIS MUSCLE  02/18/2011   TRANSTHORACIC ECHOCARDIOGRAM  10/31/2012   mild LVH,  grade 1 diastolic dysfunction,  ef 55-65%/  mild MR/  trivial TR    OB History   No obstetric history on file.      Home Medications    Prior to Admission medications   Medication Sig Start Date End Date Taking? Authorizing Provider  benzonatate  (TESSALON ) 100 MG capsule Take 1 capsule (100 mg total) by mouth 3 (three) times daily as needed for  cough. 09/23/23  Yes Nakai Yard, Sharlet POUR, MD  doxycycline  (VIBRAMYCIN ) 100 MG capsule Take 1 capsule (100 mg total) by mouth 2 (two) times daily for 7 days. 09/23/23 09/30/23 Yes Vonna Sharlet POUR, MD  calcium  acetate (PHOSLO ) 667 MG capsule Take by mouth.    [provider]  esomeprazole (NEXIUM) 20 MG capsule Take 20 mg by mouth every morning.    [provider]  midodrine  (PROAMATINE ) 10 MG tablet Take 10 mg by mouth See admin instructions. Take 1 tablet by mouth three times a week as directed take one 30 minutes before starting dialysis and repeat x 1 halfway through tx. 12/08/22   [provider]    Family History Family History  Problem Relation Age of Onset   Healthy Mother    Hypertension Father    Kidney failure Father    Hypertension Sister    Thyroid  disease Sister     Social History Social History   Tobacco Use   Smoking status: Never    Passive exposure: Never   Smokeless tobacco: Never  Vaping Use   Vaping status: Never Used  Substance Use Topics    Alcohol use: Not Currently    Comment: occasionally drinks a few beers   Drug use: No     Allergies   Amlodipine, Cefazolin , Cephalosporins, and Lisinopril   Review of Systems Review of Systems   Physical Exam Triage Vital Signs ED Triage Vitals  Encounter Vitals Group     BP 09/23/23 1027 (!) 88/58     Systolic BP Percentile --      Diastolic BP Percentile --      Pulse Rate 09/23/23 1027 80     Resp 09/23/23 1027 16     Temp 09/23/23 1027 98.3 F (36.8 C)     Temp Source 09/23/23 1027 Oral     SpO2 09/23/23 1027 96 %     Weight 09/23/23 1027 165 lb (74.8 kg)     Height 09/23/23 1027 5' 3.5 (1.613 m)     Head Circumference --      Peak Flow --      Pain Score 09/23/23 1026 6     Pain Loc --      Pain Education --      Exclude from Growth Chart --    No data found.  Updated Vital Signs BP (!) 88/58 (BP Location: Left Arm)   Pulse 80   Temp 98.3 F (36.8 C) (Oral)   Resp 16   Ht 5' 3.5 (1.613 m)   Wt 74.8 kg   LMP  (LMP Unknown)   SpO2 96%   BMI 28.77 kg/m   Visual Acuity Right Eye Distance:   Left Eye Distance:   Bilateral Distance:    Right Eye Near:   Left Eye Near:    Bilateral Near:     Physical Exam Vitals reviewed.  Constitutional:      General: She is not in acute distress.    Appearance: She is not toxic-appearing.  HENT:     Nose: Nose normal.     Mouth/Throat:     Mouth: Mucous membranes are moist.     Comments: No erythema.  There is white mucus draining Eyes:     Extraocular Movements: Extraocular movements intact.     Conjunctiva/sclera: Conjunctivae normal.     Pupils: Pupils are equal, round, and reactive to light.  Cardiovascular:     Rate and Rhythm: Normal rate and regular rhythm.  Heart sounds: No murmur heard. Pulmonary:     Effort: Pulmonary effort is normal. No respiratory distress.     Breath sounds: No stridor. No wheezing, rhonchi or rales.  Musculoskeletal:     Cervical back: Neck supple.   Lymphadenopathy:     Cervical: No cervical adenopathy.  Skin:    Capillary Refill: Capillary refill takes less than 2 seconds.     Coloration: Skin is not jaundiced or pale.  Neurological:     General: No focal deficit present.     Mental Status: She is alert and oriented to person, place, and time.  Psychiatric:        Behavior: Behavior normal.      UC Treatments / Results  Labs (all labs ordered are listed, but only abnormal results are displayed) Labs Reviewed - No data to display  EKG   Radiology No results found.  Procedures Procedures (including critical care time)  Medications Ordered in UC Medications - No data to display  Initial Impression / Assessment and Plan / UC Course  I have reviewed the triage vital signs and the nursing notes.  Pertinent labs & imaging results that were available during my care of the patient were reviewed by me and considered in my medical decision making (see chart for details).     Doxycycline  is sent in to treat sinusitis since she has been symptomatic for 4 weeks.  Tessalon  Perles were sent in for the cough  It appears that her borderline low blood pressure is due to having just come from dialysis.  No specific treatment needed today for the blood pressure Final Clinical Impressions(s) / UC Diagnoses   Final diagnoses:  Acute sinusitis, recurrence not specified, unspecified location     Discharge Instructions      Take doxycycline  100 mg --1 capsule 2 times daily for 7 days  Take benzonatate  100 mg, 1 tab every 8 hours as needed for cough.       ED Prescriptions     Medication Sig Dispense Auth. Provider   doxycycline  (VIBRAMYCIN ) 100 MG capsule Take 1 capsule (100 mg total) by mouth 2 (two) times daily for 7 days. 14 capsule Keylan Costabile K, MD   benzonatate  (TESSALON ) 100 MG capsule Take 1 capsule (100 mg total) by mouth 3 (three) times daily as needed for cough. 21 capsule Jerica Creegan K, MD       PDMP not reviewed this encounter.   Vonna Sharlet POUR, MD 09/23/23 1050    Vonna Sharlet POUR, MD 09/23/23 1051

## 2023-09-23 NOTE — ED Triage Notes (Signed)
 Patient here today with c/o ST, nasal drainage, and cough X 1 month. Patient is on dialysis.

## 2023-09-23 NOTE — Discharge Instructions (Addendum)
Take doxycycline 100 mg --1 capsule 2 times daily for 7 days  Take benzonatate 100 mg, 1 tab every 8 hours as needed for cough.

## 2023-09-26 DIAGNOSIS — N2581 Secondary hyperparathyroidism of renal origin: Secondary | ICD-10-CM | POA: Diagnosis not present

## 2023-09-26 DIAGNOSIS — N186 End stage renal disease: Secondary | ICD-10-CM | POA: Diagnosis not present

## 2023-09-26 DIAGNOSIS — D631 Anemia in chronic kidney disease: Secondary | ICD-10-CM | POA: Diagnosis not present

## 2023-09-26 DIAGNOSIS — Z992 Dependence on renal dialysis: Secondary | ICD-10-CM | POA: Diagnosis not present

## 2023-09-26 DIAGNOSIS — D688 Other specified coagulation defects: Secondary | ICD-10-CM | POA: Diagnosis not present

## 2023-09-28 DIAGNOSIS — Z992 Dependence on renal dialysis: Secondary | ICD-10-CM | POA: Diagnosis not present

## 2023-09-28 DIAGNOSIS — D631 Anemia in chronic kidney disease: Secondary | ICD-10-CM | POA: Diagnosis not present

## 2023-09-28 DIAGNOSIS — N186 End stage renal disease: Secondary | ICD-10-CM | POA: Diagnosis not present

## 2023-09-28 DIAGNOSIS — D688 Other specified coagulation defects: Secondary | ICD-10-CM | POA: Diagnosis not present

## 2023-09-28 DIAGNOSIS — N2581 Secondary hyperparathyroidism of renal origin: Secondary | ICD-10-CM | POA: Diagnosis not present

## 2023-09-30 DIAGNOSIS — D631 Anemia in chronic kidney disease: Secondary | ICD-10-CM | POA: Diagnosis not present

## 2023-09-30 DIAGNOSIS — D688 Other specified coagulation defects: Secondary | ICD-10-CM | POA: Diagnosis not present

## 2023-09-30 DIAGNOSIS — N2581 Secondary hyperparathyroidism of renal origin: Secondary | ICD-10-CM | POA: Diagnosis not present

## 2023-09-30 DIAGNOSIS — Z992 Dependence on renal dialysis: Secondary | ICD-10-CM | POA: Diagnosis not present

## 2023-09-30 DIAGNOSIS — N186 End stage renal disease: Secondary | ICD-10-CM | POA: Diagnosis not present

## 2023-10-03 DIAGNOSIS — D631 Anemia in chronic kidney disease: Secondary | ICD-10-CM | POA: Diagnosis not present

## 2023-10-03 DIAGNOSIS — N186 End stage renal disease: Secondary | ICD-10-CM | POA: Diagnosis not present

## 2023-10-03 DIAGNOSIS — D688 Other specified coagulation defects: Secondary | ICD-10-CM | POA: Diagnosis not present

## 2023-10-03 DIAGNOSIS — Z992 Dependence on renal dialysis: Secondary | ICD-10-CM | POA: Diagnosis not present

## 2023-10-03 DIAGNOSIS — N2581 Secondary hyperparathyroidism of renal origin: Secondary | ICD-10-CM | POA: Diagnosis not present

## 2023-10-05 DIAGNOSIS — N2581 Secondary hyperparathyroidism of renal origin: Secondary | ICD-10-CM | POA: Diagnosis not present

## 2023-10-05 DIAGNOSIS — N186 End stage renal disease: Secondary | ICD-10-CM | POA: Diagnosis not present

## 2023-10-05 DIAGNOSIS — D631 Anemia in chronic kidney disease: Secondary | ICD-10-CM | POA: Diagnosis not present

## 2023-10-05 DIAGNOSIS — Z992 Dependence on renal dialysis: Secondary | ICD-10-CM | POA: Diagnosis not present

## 2023-10-05 DIAGNOSIS — D688 Other specified coagulation defects: Secondary | ICD-10-CM | POA: Diagnosis not present

## 2023-10-07 DIAGNOSIS — N2581 Secondary hyperparathyroidism of renal origin: Secondary | ICD-10-CM | POA: Diagnosis not present

## 2023-10-07 DIAGNOSIS — D688 Other specified coagulation defects: Secondary | ICD-10-CM | POA: Diagnosis not present

## 2023-10-07 DIAGNOSIS — N186 End stage renal disease: Secondary | ICD-10-CM | POA: Diagnosis not present

## 2023-10-07 DIAGNOSIS — D631 Anemia in chronic kidney disease: Secondary | ICD-10-CM | POA: Diagnosis not present

## 2023-10-07 DIAGNOSIS — Z992 Dependence on renal dialysis: Secondary | ICD-10-CM | POA: Diagnosis not present

## 2023-10-10 ENCOUNTER — Encounter (HOSPITAL_COMMUNITY): Payer: Self-pay | Admitting: Emergency Medicine

## 2023-10-10 ENCOUNTER — Ambulatory Visit (INDEPENDENT_AMBULATORY_CARE_PROVIDER_SITE_OTHER): Payer: 59

## 2023-10-10 ENCOUNTER — Other Ambulatory Visit: Payer: Self-pay

## 2023-10-10 ENCOUNTER — Ambulatory Visit (HOSPITAL_COMMUNITY)
Admission: EM | Admit: 2023-10-10 | Discharge: 2023-10-10 | Disposition: A | Discharge status: Home / Self Care | Payer: 59 | Attending: Family Medicine | Admitting: Family Medicine

## 2023-10-10 DIAGNOSIS — R058 Other specified cough: Secondary | ICD-10-CM

## 2023-10-10 DIAGNOSIS — R051 Acute cough: Secondary | ICD-10-CM

## 2023-10-10 DIAGNOSIS — R9389 Abnormal findings on diagnostic imaging of other specified body structures: Secondary | ICD-10-CM | POA: Diagnosis not present

## 2023-10-10 DIAGNOSIS — N2581 Secondary hyperparathyroidism of renal origin: Secondary | ICD-10-CM | POA: Diagnosis not present

## 2023-10-10 DIAGNOSIS — D631 Anemia in chronic kidney disease: Secondary | ICD-10-CM | POA: Diagnosis not present

## 2023-10-10 DIAGNOSIS — Z992 Dependence on renal dialysis: Secondary | ICD-10-CM | POA: Diagnosis not present

## 2023-10-10 DIAGNOSIS — D688 Other specified coagulation defects: Secondary | ICD-10-CM | POA: Diagnosis not present

## 2023-10-10 DIAGNOSIS — N186 End stage renal disease: Secondary | ICD-10-CM | POA: Diagnosis not present

## 2023-10-10 DIAGNOSIS — I517 Cardiomegaly: Secondary | ICD-10-CM | POA: Diagnosis not present

## 2023-10-10 MED ORDER — METHYLPREDNISOLONE 4 MG PO TBPK: ORAL_TABLET | ORAL | 0 refills | Status: DC

## 2023-10-10 NOTE — ED Triage Notes (Addendum)
Repeatedly, patient reports phlegm has an odor  Reports taking robitussin

## 2023-10-10 NOTE — ED Provider Notes (Signed)
[facility]    CSN: [id number] Arrival date & time: 10/10/23  4098      History   Chief Complaint Chief Complaint  Patient presents with   Cough    HPI Victoria [name] is a 68 y.o. female.   Patient presenting with persistent cough as well sputum production.  Patient states that she feels like the sputum has a smell to it.  Patient denies any fevers or any chills.  Patient recently completed a doxycycline course for sinusitis.  Patient otherwise has no other concerns at this time.  Patient denies any shortness of breath or chest pain at this time.   Cough   Past Medical History:  Diagnosis Date   Arthritis    Chronic bronchitis (HCC)    Complication of anesthesia ~ 2011   "they gave me a medicine that swolled me and mouth burning up" (08/23/2012)   Complication of anesthesia    slow to wake up   ESRD (end stage renal disease) on dialysis [facility]) Nephrologist-- dr deterding   ESRD due to HTN-; "M/W/F; [address]" (11/28/2015   GERD (gastroesophageal reflux disease)    no meds, diet controlled   History of acute pulmonary edema    2003   Hypertension    no longer on medications   Left patella fracture    Pneumonia    as a child   Seasonal allergies    Secondary hyperparathyroidism, renal (HCC)    s/p  total parathyroidectomy  2014   Thyroid disease    Wears glasses    Wound dehiscence, surgical 11/28/2015    Patient Active Problem List   Diagnosis Date Noted   Class 1 obesity due to excess calories with body mass index (BMI) of 31.0 to 31.9 in adult 03/08/2023   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) 03/07/2023   Leukocytosis 03/07/2023   Complication of vascular dialysis catheter 01/20/2022   ESRD on dialysis (HCC) 01/13/2022   S/P total parathyroidectomy 01/13/2022   Aortic atherosclerosis (HCC) 02/23/2021   Bilateral carpal tunnel syndrome 02/26/2019   Numbness 02/26/2019   Pain 02/26/2019   Trigger index finger of  right hand 02/26/2019   Bilateral sensorineural hearing loss 01/16/2019   Subjective tinnitus, bilateral 01/16/2019   Deficiency of macronutrients 10/04/2013   Mechanical complication of other vascular device, implant, and graft 01/12/2013   Hyperlipidemia 10/25/2012   Thyroid nodule 03/22/2011   Hypotension of hemodialysis 03/10/2011   Anemia in chronic kidney disease 06/28/2001   Iron deficiency anemia 06/28/2001    Past Surgical History:  Procedure Laterality Date   ANKLE FRACTURE SURGERY Right ~ 2005   "has pins in it"   APPLICATION OF WOUND VAC Left 11/28/2015   knee   APPLICATION OF WOUND VAC Left 11/28/2015   Procedure: APPLICATION OF WOUND VAC;  Surgeon: Samson Frederic, MD;  [facility];  Service: Orthopedics;  Laterality: Left;   ARTERIOVENOUS GRAFT PLACEMENT  03/25/2005   Right forearm  w/  multiple Revision's    AV FISTULA PLACEMENT Right 04/13/2022   Procedure: INSERTION OF RIGHT ARTERIOVENOUS (AV) 4-49mm x 45cm GORE-TEX GRAFT;  Surgeon: Chuck Hint, MD;  Location: [facility] OR;  Service: Vascular;  Laterality: Right;   CARDIOVASCULAR STRESS TEST  08/24/2012   abnormal nuclear study/  inferolateral and anteroseptal areas of scar,  no ischemia/  normal LV function and wall motion, ef 77%   CATARACT EXTRACTION W/ INTRAOCULAR LENS IMPLANT Bilateral    cataract removal bilateral, lens implant  in right eye   DIALYSIS FISTULA CREATION  09/20/1990   left upper arm ---  w/  Multiple Revision's until 2006   ECTOPIC PREGNANCY SURGERY  05/21/1969   FRACTURE SURGERY     HARDWARE REMOVAL Right 07/10/2020   Procedure: Removal of deep implant right fibula and tibia; steroid injection right ankle;  Surgeon: Toni Arthurs, MD;  Location: [facility] OR;  Service: Orthopedics;  Laterality: Right;   I & D EXTREMITY Left 11/28/2015   Procedure: IRRIGATION AND DEBRIDEMENT LEFT KNEE WOUND ;  Surgeon: Samson Frederic, MD;  [facility];  Service: Orthopedics;  Laterality: Left;   INCISION AND  DRAINAGE OF WOUND Left 11/28/2015   knee   INSERTION OF DIALYSIS CATHETER Right 03/07/2023   Procedure: Enis Gash GUIDED INSERTION OF TUNNELED DIALYSIS CATHETER;  Surgeon: Leonie Douglas, MD;  Location: [facility] OR;  Service: Vascular;  Laterality: Right;   IR FLUORO GUIDE CV LINE LEFT  02/09/2022   ORIF PATELLA Left 10/31/2015   Procedure: OPEN REDUCTION INTERNAL (ORIF) FIXATION LEFT PATELLA;  Surgeon: Samson Frederic, MD;  Location: [facility];  Service: Orthopedics;  Laterality: Left;   PATELLAR TENDON REPAIR  10/03/2012   Procedure: PATELLA TENDON REPAIR;  Surgeon: Senaida Lange, MD;  [facility];  Service: Orthopedics;  Laterality: Right;   PATELLECTOMY  10/03/2012   Procedure: PATELLECTOMY;  Surgeon: Senaida Lange, MD;  [facility];  Service: Orthopedics;  Laterality: Right;  RIGHT PARTIAL PATELLECTOMY AND PATELLA TENDON REPAIR   REVISION OF ARTERIOVENOUS GORETEX GRAFT  09/27/2012   Procedure: REVISION OF ARTERIOVENOUS GORETEX GRAFT;  Surgeon: Pryor Ochoa, MD;  Location: [facility] OR;  Service: Vascular;  Laterality: Right;  1) Replacement of venous half of loop with 6mm Gortex graft  2) Excision of erroded pseudoaneurysm of graft with primary closure.   REVISION OF ARTERIOVENOUS GORETEX GRAFT Right 01/23/2013   Procedure: REVISION OF ARTERIOVENOUS GORETEX GRAFT;  Surgeon: Fransisco Hertz, MD;  Location: [facility] OR;  Service: Vascular;  Laterality: Right;  Using piece of 6mm x 20cm Gortex graft.    REVISION OF ARTERIOVENOUS GORETEX GRAFT Right 10/07/2015   Procedure: REVISION OF Right arm ARTERIOVENOUS GORETEX GRAFT;  Surgeon: Sherren Kerns, MD;  Location: [facility] OR;  Service: Vascular;  Laterality: Right;   REVISION OF ARTERIOVENOUS GORETEX GRAFT Right 02/17/2016   Procedure: REVISION OF ARTERIOVENOUS GORETEX GRAFT;  Surgeon: Sherren Kerns, MD;  Location: [facility] OR;  Service: Vascular;  Laterality: Right;   REVISON OF ARTERIOVENOUS FISTULA Right 03/07/2023   Procedure: REVISON OF  ARTERIOVENOUS FISTULA ARM;  Surgeon: Leonie Douglas, MD;  [facility];  Service: Vascular;  Laterality: Right;   TOOTH EXTRACTION     10/2021   TOTAL ABDOMINAL HYSTERECTOMY  03/05/2005   w/  Right Ovarian Cystectomy   TOTAL PARATHYROIDECTOMY/  THYROID ISTHMUSECTOMY/  AUTOTRANSPLANTATION PARATHYROID TISSUE TO LEFT BRACHIORIADIALIS MUSCLE  02/18/2011   TRANSTHORACIC ECHOCARDIOGRAM  10/31/2012   mild LVH,  grade 1 diastolic dysfunction,  ef 55-65%/  mild MR/  trivial TR    OB History   No obstetric history on file.      Home Medications    Prior to Admission medications   Medication Sig Start Date End Date Taking? Authorizing Provider  methylPREDNISolone (MEDROL DOSEPAK) 4 MG TBPK tablet Follow instructions on package 10/10/23  Yes Brenton Grills, MD  benzonatate (TESSALON) 100 MG capsule Take 1 capsule (100 mg total) by mouth 3 (three) times daily as needed for cough. Patient not  taking: Reported on 10/10/2023 09/23/23   Zenia Resides, MD  calcium acetate (PHOSLO) 667 MG capsule Take by mouth.    [provider]  esomeprazole (NEXIUM) 20 MG capsule Take 20 mg by mouth every morning.    [provider]  midodrine (PROAMATINE) 10 MG tablet Take 10 mg by mouth See admin instructions. Take 1 tablet by mouth three times a week as directed take one 30 minutes before starting dialysis and repeat x 1 halfway through tx. 12/08/22   [provider]    Family History Family History  Problem Relation Age of Onset   Healthy Mother    Hypertension Father    Kidney failure Father    Hypertension Sister    Thyroid disease Sister     Social History Social History   Tobacco Use   Smoking status: Never    Passive exposure: Never   Smokeless tobacco: Never  Vaping Use   Vaping status: Never Used  Substance Use Topics   Alcohol use: Not Currently    Comment: occasionally drinks a few beers   Drug use: No     Allergies   Amlodipine, Cefazolin,  Cephalosporins, and Lisinopril   Review of Systems Review of Systems  Respiratory:  Positive for cough.      Physical Exam Triage Vital Signs ED Triage Vitals  Encounter Vitals Group     BP 10/10/23 1038 115/72     Systolic BP Percentile --      Diastolic BP Percentile --      Pulse Rate 10/10/23 1038 76     Resp 10/10/23 1038 18     Temp 10/10/23 1038 98.3 F (36.8 C)     Temp Source 10/10/23 1038 Oral     SpO2 10/10/23 1038 96 %     Weight --      Height --      Head Circumference --      Peak Flow --      Pain Score 10/10/23 1035 10     Pain Loc --      Pain Education --      Exclude from Growth Chart --    No data found.  Updated Vital Signs BP 115/72 (BP Location: Left Arm)   Pulse 76   Temp 98.3 F (36.8 C) (Oral)   Resp 18   LMP  (LMP Unknown)   SpO2 96%   Visual Acuity Right Eye Distance:   Left Eye Distance:   Bilateral Distance:    Right Eye Near:   Left Eye Near:    Bilateral Near:     Physical Exam Constitutional:      Appearance: Normal appearance.  Cardiovascular:     Rate and Rhythm: Normal rate and regular rhythm.     Pulses: Normal pulses.     Heart sounds: Normal heart sounds.  Pulmonary:     Effort: Pulmonary effort is normal. No respiratory distress.     Breath sounds: Normal breath sounds. No stridor. No wheezing, rhonchi or rales.  Chest:     Chest wall: No tenderness.  Neurological:     Mental Status: She is alert.      UC Treatments / Results  Labs (all labs ordered are listed, but only abnormal results are displayed) Labs Reviewed - No data to display  EKG   Radiology DG Chest 2 View Result Date: 10/10/2023 CLINICAL DATA:  Productive cough for 2 weeks. EXAM: CHEST - 2 VIEW COMPARISON:  March 31, 2023.  FINDINGS: Stable cardiomegaly. Elevated left hemidiaphragm is again noted. No acute pulmonary disease. Bony thorax is unremarkable. IMPRESSION: No active cardiopulmonary disease. Electronically Signed   By: Lupita Raider M.D.   On: 10/10/2023 11:24    Procedures Procedures (including critical care time)  Medications Ordered in UC Medications - No data to display  Initial Impression / Assessment and Plan / UC Course  I have reviewed the triage vital signs and the nursing notes.  Pertinent labs & imaging results that were available during my care of the patient were reviewed by me and considered in my medical decision making (see chart for details).     Patient likely dealing with a productive cough after upper respiratory infection.  Patient's x-ray shows no signs of any acute abnormality, patient could be taking longer to clear out the mucus.  Will go ahead and prescribe patient a Medrol Dosepak to help with this.  Patient vies to follow instructions on packaging to follow-up.  If there is no improvement despite medication Final Clinical Impressions(s) / UC Diagnoses   Final diagnoses:  Acute cough     Discharge Instructions      Please follow the instructions on your medication package.  Please continue to stay hydrated and follow-up if you have any worsening symptoms.     ED Prescriptions     Medication Sig Dispense Auth. Provider   methylPREDNISolone (MEDROL DOSEPAK) 4 MG TBPK tablet Follow instructions on package 1 each Brenton Grills, MD      PDMP not reviewed this encounter.   Brenton Grills, MD 10/10/23 1149

## 2023-10-10 NOTE — ED Triage Notes (Signed)
Complains of cough, thick phlegm and sore throat for 2 weeks.   Seen 09/23/2023 for the same and did not improve per patient  Patient is a dialysis patient.  Patient just completed her session today

## 2023-10-10 NOTE — Discharge Instructions (Addendum)
Please follow the instructions on your medication package.  Please continue to stay hydrated and follow-up if you have any worsening symptoms.

## 2023-10-12 DIAGNOSIS — D631 Anemia in chronic kidney disease: Secondary | ICD-10-CM | POA: Diagnosis not present

## 2023-10-12 DIAGNOSIS — Z992 Dependence on renal dialysis: Secondary | ICD-10-CM | POA: Diagnosis not present

## 2023-10-12 DIAGNOSIS — D688 Other specified coagulation defects: Secondary | ICD-10-CM | POA: Diagnosis not present

## 2023-10-12 DIAGNOSIS — N2581 Secondary hyperparathyroidism of renal origin: Secondary | ICD-10-CM | POA: Diagnosis not present

## 2023-10-12 DIAGNOSIS — N186 End stage renal disease: Secondary | ICD-10-CM | POA: Diagnosis not present

## 2023-10-14 DIAGNOSIS — D688 Other specified coagulation defects: Secondary | ICD-10-CM | POA: Diagnosis not present

## 2023-10-14 DIAGNOSIS — N2581 Secondary hyperparathyroidism of renal origin: Secondary | ICD-10-CM | POA: Diagnosis not present

## 2023-10-14 DIAGNOSIS — N186 End stage renal disease: Secondary | ICD-10-CM | POA: Diagnosis not present

## 2023-10-14 DIAGNOSIS — D631 Anemia in chronic kidney disease: Secondary | ICD-10-CM | POA: Diagnosis not present

## 2023-10-14 DIAGNOSIS — Z992 Dependence on renal dialysis: Secondary | ICD-10-CM | POA: Diagnosis not present

## 2023-10-17 DIAGNOSIS — N2581 Secondary hyperparathyroidism of renal origin: Secondary | ICD-10-CM | POA: Diagnosis not present

## 2023-10-17 DIAGNOSIS — D631 Anemia in chronic kidney disease: Secondary | ICD-10-CM | POA: Diagnosis not present

## 2023-10-17 DIAGNOSIS — Z992 Dependence on renal dialysis: Secondary | ICD-10-CM | POA: Diagnosis not present

## 2023-10-17 DIAGNOSIS — D688 Other specified coagulation defects: Secondary | ICD-10-CM | POA: Diagnosis not present

## 2023-10-17 DIAGNOSIS — N186 End stage renal disease: Secondary | ICD-10-CM | POA: Diagnosis not present

## 2023-10-19 DIAGNOSIS — N186 End stage renal disease: Secondary | ICD-10-CM | POA: Diagnosis not present

## 2023-10-19 DIAGNOSIS — Z992 Dependence on renal dialysis: Secondary | ICD-10-CM | POA: Diagnosis not present

## 2023-10-19 DIAGNOSIS — N2581 Secondary hyperparathyroidism of renal origin: Secondary | ICD-10-CM | POA: Diagnosis not present

## 2023-10-19 DIAGNOSIS — D688 Other specified coagulation defects: Secondary | ICD-10-CM | POA: Diagnosis not present

## 2023-10-19 DIAGNOSIS — D631 Anemia in chronic kidney disease: Secondary | ICD-10-CM | POA: Diagnosis not present

## 2023-10-21 DIAGNOSIS — D631 Anemia in chronic kidney disease: Secondary | ICD-10-CM | POA: Diagnosis not present

## 2023-10-21 DIAGNOSIS — D688 Other specified coagulation defects: Secondary | ICD-10-CM | POA: Diagnosis not present

## 2023-10-21 DIAGNOSIS — N186 End stage renal disease: Secondary | ICD-10-CM | POA: Diagnosis not present

## 2023-10-21 DIAGNOSIS — Z992 Dependence on renal dialysis: Secondary | ICD-10-CM | POA: Diagnosis not present

## 2023-10-21 DIAGNOSIS — I120 Hypertensive chronic kidney disease with stage 5 chronic kidney disease or end stage renal disease: Secondary | ICD-10-CM | POA: Diagnosis not present

## 2023-10-21 DIAGNOSIS — N2581 Secondary hyperparathyroidism of renal origin: Secondary | ICD-10-CM | POA: Diagnosis not present

## 2023-10-24 DIAGNOSIS — N2581 Secondary hyperparathyroidism of renal origin: Secondary | ICD-10-CM | POA: Diagnosis not present

## 2023-10-24 DIAGNOSIS — N186 End stage renal disease: Secondary | ICD-10-CM | POA: Diagnosis not present

## 2023-10-24 DIAGNOSIS — Z992 Dependence on renal dialysis: Secondary | ICD-10-CM | POA: Diagnosis not present

## 2023-10-24 DIAGNOSIS — D688 Other specified coagulation defects: Secondary | ICD-10-CM | POA: Diagnosis not present

## 2023-10-24 DIAGNOSIS — D631 Anemia in chronic kidney disease: Secondary | ICD-10-CM | POA: Diagnosis not present

## 2023-10-26 DIAGNOSIS — D688 Other specified coagulation defects: Secondary | ICD-10-CM | POA: Diagnosis not present

## 2023-10-26 DIAGNOSIS — N186 End stage renal disease: Secondary | ICD-10-CM | POA: Diagnosis not present

## 2023-10-26 DIAGNOSIS — N2581 Secondary hyperparathyroidism of renal origin: Secondary | ICD-10-CM | POA: Diagnosis not present

## 2023-10-26 DIAGNOSIS — Z992 Dependence on renal dialysis: Secondary | ICD-10-CM | POA: Diagnosis not present

## 2023-10-26 DIAGNOSIS — D631 Anemia in chronic kidney disease: Secondary | ICD-10-CM | POA: Diagnosis not present

## 2023-10-28 DIAGNOSIS — D688 Other specified coagulation defects: Secondary | ICD-10-CM | POA: Diagnosis not present

## 2023-10-28 DIAGNOSIS — Z992 Dependence on renal dialysis: Secondary | ICD-10-CM | POA: Diagnosis not present

## 2023-10-28 DIAGNOSIS — D631 Anemia in chronic kidney disease: Secondary | ICD-10-CM | POA: Diagnosis not present

## 2023-10-28 DIAGNOSIS — N2581 Secondary hyperparathyroidism of renal origin: Secondary | ICD-10-CM | POA: Diagnosis not present

## 2023-10-28 DIAGNOSIS — N186 End stage renal disease: Secondary | ICD-10-CM | POA: Diagnosis not present

## 2023-10-31 DIAGNOSIS — N2581 Secondary hyperparathyroidism of renal origin: Secondary | ICD-10-CM | POA: Diagnosis not present

## 2023-10-31 DIAGNOSIS — N186 End stage renal disease: Secondary | ICD-10-CM | POA: Diagnosis not present

## 2023-10-31 DIAGNOSIS — D688 Other specified coagulation defects: Secondary | ICD-10-CM | POA: Diagnosis not present

## 2023-10-31 DIAGNOSIS — D631 Anemia in chronic kidney disease: Secondary | ICD-10-CM | POA: Diagnosis not present

## 2023-10-31 DIAGNOSIS — Z992 Dependence on renal dialysis: Secondary | ICD-10-CM | POA: Diagnosis not present

## 2023-11-02 DIAGNOSIS — N186 End stage renal disease: Secondary | ICD-10-CM | POA: Diagnosis not present

## 2023-11-02 DIAGNOSIS — N2581 Secondary hyperparathyroidism of renal origin: Secondary | ICD-10-CM | POA: Diagnosis not present

## 2023-11-02 DIAGNOSIS — D688 Other specified coagulation defects: Secondary | ICD-10-CM | POA: Diagnosis not present

## 2023-11-02 DIAGNOSIS — Z992 Dependence on renal dialysis: Secondary | ICD-10-CM | POA: Diagnosis not present

## 2023-11-02 DIAGNOSIS — D631 Anemia in chronic kidney disease: Secondary | ICD-10-CM | POA: Diagnosis not present

## 2023-11-04 DIAGNOSIS — D631 Anemia in chronic kidney disease: Secondary | ICD-10-CM | POA: Diagnosis not present

## 2023-11-04 DIAGNOSIS — N2581 Secondary hyperparathyroidism of renal origin: Secondary | ICD-10-CM | POA: Diagnosis not present

## 2023-11-04 DIAGNOSIS — N186 End stage renal disease: Secondary | ICD-10-CM | POA: Diagnosis not present

## 2023-11-04 DIAGNOSIS — D688 Other specified coagulation defects: Secondary | ICD-10-CM | POA: Diagnosis not present

## 2023-11-04 DIAGNOSIS — Z992 Dependence on renal dialysis: Secondary | ICD-10-CM | POA: Diagnosis not present

## 2023-11-07 DIAGNOSIS — Z992 Dependence on renal dialysis: Secondary | ICD-10-CM | POA: Diagnosis not present

## 2023-11-07 DIAGNOSIS — N2581 Secondary hyperparathyroidism of renal origin: Secondary | ICD-10-CM | POA: Diagnosis not present

## 2023-11-07 DIAGNOSIS — N186 End stage renal disease: Secondary | ICD-10-CM | POA: Diagnosis not present

## 2023-11-07 DIAGNOSIS — D631 Anemia in chronic kidney disease: Secondary | ICD-10-CM | POA: Diagnosis not present

## 2023-11-07 DIAGNOSIS — D688 Other specified coagulation defects: Secondary | ICD-10-CM | POA: Diagnosis not present

## 2023-11-09 DIAGNOSIS — N2581 Secondary hyperparathyroidism of renal origin: Secondary | ICD-10-CM | POA: Diagnosis not present

## 2023-11-09 DIAGNOSIS — N186 End stage renal disease: Secondary | ICD-10-CM | POA: Diagnosis not present

## 2023-11-09 DIAGNOSIS — Z992 Dependence on renal dialysis: Secondary | ICD-10-CM | POA: Diagnosis not present

## 2023-11-09 DIAGNOSIS — D688 Other specified coagulation defects: Secondary | ICD-10-CM | POA: Diagnosis not present

## 2023-11-09 DIAGNOSIS — D631 Anemia in chronic kidney disease: Secondary | ICD-10-CM | POA: Diagnosis not present

## 2023-11-11 DIAGNOSIS — D688 Other specified coagulation defects: Secondary | ICD-10-CM | POA: Diagnosis not present

## 2023-11-11 DIAGNOSIS — Z992 Dependence on renal dialysis: Secondary | ICD-10-CM | POA: Diagnosis not present

## 2023-11-11 DIAGNOSIS — N2581 Secondary hyperparathyroidism of renal origin: Secondary | ICD-10-CM | POA: Diagnosis not present

## 2023-11-11 DIAGNOSIS — D631 Anemia in chronic kidney disease: Secondary | ICD-10-CM | POA: Diagnosis not present

## 2023-11-11 DIAGNOSIS — N186 End stage renal disease: Secondary | ICD-10-CM | POA: Diagnosis not present

## 2023-11-14 DIAGNOSIS — D688 Other specified coagulation defects: Secondary | ICD-10-CM | POA: Diagnosis not present

## 2023-11-14 DIAGNOSIS — D631 Anemia in chronic kidney disease: Secondary | ICD-10-CM | POA: Diagnosis not present

## 2023-11-14 DIAGNOSIS — N2581 Secondary hyperparathyroidism of renal origin: Secondary | ICD-10-CM | POA: Diagnosis not present

## 2023-11-14 DIAGNOSIS — N186 End stage renal disease: Secondary | ICD-10-CM | POA: Diagnosis not present

## 2023-11-14 DIAGNOSIS — Z992 Dependence on renal dialysis: Secondary | ICD-10-CM | POA: Diagnosis not present

## 2023-11-16 DIAGNOSIS — D631 Anemia in chronic kidney disease: Secondary | ICD-10-CM | POA: Diagnosis not present

## 2023-11-16 DIAGNOSIS — Z992 Dependence on renal dialysis: Secondary | ICD-10-CM | POA: Diagnosis not present

## 2023-11-16 DIAGNOSIS — D688 Other specified coagulation defects: Secondary | ICD-10-CM | POA: Diagnosis not present

## 2023-11-16 DIAGNOSIS — N2581 Secondary hyperparathyroidism of renal origin: Secondary | ICD-10-CM | POA: Diagnosis not present

## 2023-11-16 DIAGNOSIS — N186 End stage renal disease: Secondary | ICD-10-CM | POA: Diagnosis not present

## 2023-11-18 DIAGNOSIS — N2581 Secondary hyperparathyroidism of renal origin: Secondary | ICD-10-CM | POA: Diagnosis not present

## 2023-11-18 DIAGNOSIS — Z992 Dependence on renal dialysis: Secondary | ICD-10-CM | POA: Diagnosis not present

## 2023-11-18 DIAGNOSIS — I120 Hypertensive chronic kidney disease with stage 5 chronic kidney disease or end stage renal disease: Secondary | ICD-10-CM | POA: Diagnosis not present

## 2023-11-18 DIAGNOSIS — D631 Anemia in chronic kidney disease: Secondary | ICD-10-CM | POA: Diagnosis not present

## 2023-11-18 DIAGNOSIS — N186 End stage renal disease: Secondary | ICD-10-CM | POA: Diagnosis not present

## 2023-11-18 DIAGNOSIS — D688 Other specified coagulation defects: Secondary | ICD-10-CM | POA: Diagnosis not present

## 2023-11-21 DIAGNOSIS — D631 Anemia in chronic kidney disease: Secondary | ICD-10-CM | POA: Diagnosis not present

## 2023-11-21 DIAGNOSIS — Z992 Dependence on renal dialysis: Secondary | ICD-10-CM | POA: Diagnosis not present

## 2023-11-21 DIAGNOSIS — D688 Other specified coagulation defects: Secondary | ICD-10-CM | POA: Diagnosis not present

## 2023-11-21 DIAGNOSIS — N2581 Secondary hyperparathyroidism of renal origin: Secondary | ICD-10-CM | POA: Diagnosis not present

## 2023-11-21 DIAGNOSIS — N186 End stage renal disease: Secondary | ICD-10-CM | POA: Diagnosis not present

## 2023-11-23 DIAGNOSIS — D688 Other specified coagulation defects: Secondary | ICD-10-CM | POA: Diagnosis not present

## 2023-11-23 DIAGNOSIS — N186 End stage renal disease: Secondary | ICD-10-CM | POA: Diagnosis not present

## 2023-11-23 DIAGNOSIS — Z992 Dependence on renal dialysis: Secondary | ICD-10-CM | POA: Diagnosis not present

## 2023-11-23 DIAGNOSIS — N2581 Secondary hyperparathyroidism of renal origin: Secondary | ICD-10-CM | POA: Diagnosis not present

## 2023-11-23 DIAGNOSIS — D631 Anemia in chronic kidney disease: Secondary | ICD-10-CM | POA: Diagnosis not present

## 2023-11-25 DIAGNOSIS — D631 Anemia in chronic kidney disease: Secondary | ICD-10-CM | POA: Diagnosis not present

## 2023-11-25 DIAGNOSIS — N2581 Secondary hyperparathyroidism of renal origin: Secondary | ICD-10-CM | POA: Diagnosis not present

## 2023-11-25 DIAGNOSIS — N186 End stage renal disease: Secondary | ICD-10-CM | POA: Diagnosis not present

## 2023-11-25 DIAGNOSIS — D688 Other specified coagulation defects: Secondary | ICD-10-CM | POA: Diagnosis not present

## 2023-11-25 DIAGNOSIS — Z992 Dependence on renal dialysis: Secondary | ICD-10-CM | POA: Diagnosis not present

## 2023-11-28 DIAGNOSIS — N186 End stage renal disease: Secondary | ICD-10-CM | POA: Diagnosis not present

## 2023-11-28 DIAGNOSIS — N2581 Secondary hyperparathyroidism of renal origin: Secondary | ICD-10-CM | POA: Diagnosis not present

## 2023-11-28 DIAGNOSIS — D631 Anemia in chronic kidney disease: Secondary | ICD-10-CM | POA: Diagnosis not present

## 2023-11-28 DIAGNOSIS — D688 Other specified coagulation defects: Secondary | ICD-10-CM | POA: Diagnosis not present

## 2023-11-28 DIAGNOSIS — Z992 Dependence on renal dialysis: Secondary | ICD-10-CM | POA: Diagnosis not present

## 2023-11-30 DIAGNOSIS — N2581 Secondary hyperparathyroidism of renal origin: Secondary | ICD-10-CM | POA: Diagnosis not present

## 2023-11-30 DIAGNOSIS — D688 Other specified coagulation defects: Secondary | ICD-10-CM | POA: Diagnosis not present

## 2023-11-30 DIAGNOSIS — D631 Anemia in chronic kidney disease: Secondary | ICD-10-CM | POA: Diagnosis not present

## 2023-11-30 DIAGNOSIS — N186 End stage renal disease: Secondary | ICD-10-CM | POA: Diagnosis not present

## 2023-11-30 DIAGNOSIS — Z992 Dependence on renal dialysis: Secondary | ICD-10-CM | POA: Diagnosis not present

## 2023-12-01 ENCOUNTER — Emergency Department (HOSPITAL_COMMUNITY)

## 2023-12-01 ENCOUNTER — Other Ambulatory Visit: Payer: Self-pay

## 2023-12-01 ENCOUNTER — Emergency Department (HOSPITAL_COMMUNITY)
Admission: EM | Admit: 2023-12-01 | Discharge: 2023-12-01 | Disposition: A | Discharge status: Home / Self Care | Attending: Emergency Medicine | Admitting: Emergency Medicine

## 2023-12-01 DIAGNOSIS — R590 Localized enlarged lymph nodes: Secondary | ICD-10-CM | POA: Diagnosis not present

## 2023-12-01 DIAGNOSIS — R0981 Nasal congestion: Secondary | ICD-10-CM | POA: Diagnosis not present

## 2023-12-01 DIAGNOSIS — I10 Essential (primary) hypertension: Secondary | ICD-10-CM | POA: Insufficient documentation

## 2023-12-01 DIAGNOSIS — R9389 Abnormal findings on diagnostic imaging of other specified body structures: Secondary | ICD-10-CM | POA: Diagnosis not present

## 2023-12-01 DIAGNOSIS — R058 Other specified cough: Secondary | ICD-10-CM | POA: Diagnosis not present

## 2023-12-01 LAB — RESP PANEL BY RT-PCR (RSV, FLU A&B, COVID)  RVPGX2
Influenza A by PCR: NEGATIVE
Influenza B by PCR: NEGATIVE
Resp Syncytial Virus by PCR: NEGATIVE
SARS Coronavirus 2 by RT PCR: NEGATIVE

## 2023-12-01 LAB — GROUP A STREP BY PCR: Group A Strep by PCR: NOT DETECTED

## 2023-12-01 NOTE — Discharge Instructions (Addendum)
 You appear to have an upper respiratory infection (URI). An upper respiratory tract infection, or cold, is a viral infection of the air passages leading to the lungs. It should improve gradually after 5-7 days. You may have a lingering cough that lasts for 2- 4 weeks after the infection.    You may also have symptoms secondary to seasonal allergies.  Your flu, covid, and RSV test were negative today.  Your strep test was negative.  Your chest x-ray did not show any signs of pneumonia.  There are no medications, such as antibiotics, that will cure your infection.   Home care instructions:   Take 10mg  cetirizine (Zyrtec) daily for allergy symptoms.  This is an over-the-counter medication you can obtain at any drugstore.   For cough: honey 1/2 to 1 teaspoon (you can dilute the honey in water or another fluid).  You can also use guaifenesin and dextromethorphan for cough which are over-the-counter medications. You can use a humidifier for chest congestion and cough.  If you don't have a humidifier, you can sit in the bathroom with the hot shower running.      For sore throat: try warm salt water gargles, cepacol lozenges, throat spray, warm tea or water with lemon/honey, popsicles or ice, or OTC cold relief medicine for throat discomfort.    For congestion: Flonase (fluticasone) 1-2 sprays in each nostril daily.  This is also an over-the-counter medication you can obtain at any drugstore.   It is important to stay hydrated: drink plenty of fluids (water, gatorade/powerade/pedialyte, juices, or teas) to keep your throat moisturized and help further relieve irritation/discomfort.   Follow-up instructions: Please follow-up with your primary care provider for further evaluation of your symptoms if you are not feeling better within the next 7 days.   Return instructions:  Please return to the Emergency Department if you experience worsening symptoms.  RETURN IMMEDIATELY IF you develop shortness of  breath, confusion or altered mental status, a new rash, become dizzy, faint, or poorly responsive, or are unable to be cared for at home. Please return if you have persistent vomiting and cannot keep down fluids or develop a fever that is not controlled by tylenol or motrin.   Please return if you have any other emergent concerns.

## 2023-12-01 NOTE — ED Notes (Signed)
 Patient transported to X-ray

## 2023-12-01 NOTE — ED Provider Notes (Signed)
 [place] EMERGENCY DEPARTMENT AT [facility] Provider Note   CSN: [id number] Arrival date & time: 12/01/23  0941     History  Chief Complaint  Patient presents with   Nasal Congestion    Victoria [name] is a 68 y.o. female with history of hypertension, seasonal allergies, presents with concern for congestion ongoing for past 2 weeks.  Reports she has also had a productive cough with white mucus.  Denies any fever, chills, shortness of breath.  Denies any abdominal pain, nausea or vomiting.  She also reports a scratchy throat.  She reports she has seasonal allergies, and is unsure if this could be where her symptoms are coming from.  HPI     Home Medications Prior to Admission medications   Medication Sig Start Date End Date Taking? Authorizing Provider  benzonatate (TESSALON) 100 MG capsule Take 1 capsule (100 mg total) by mouth 3 (three) times daily as needed for cough. Patient not taking: Reported on 10/10/2023 09/23/23   Zenia Resides, MD  calcium acetate (PHOSLO) 667 MG capsule Take by mouth.    [provider]  esomeprazole (NEXIUM) 20 MG capsule Take 20 mg by mouth every morning.    [provider]  methylPREDNISolone (MEDROL DOSEPAK) 4 MG TBPK tablet Follow instructions on package 10/10/23   Brenton Grills, MD  midodrine (PROAMATINE) 10 MG tablet Take 10 mg by mouth See admin instructions. Take 1 tablet by mouth three times a week as directed take one 30 minutes before starting dialysis and repeat x 1 halfway through tx. 12/08/22   [provider]      Allergies    Amlodipine, Cefazolin, Cephalosporins, and Lisinopril    Review of Systems   Review of Systems  Constitutional:  Negative for fever.  HENT:  Positive for congestion.   Respiratory:  Positive for cough.     Physical Exam Updated Vital Signs BP 129/72   Pulse 73   Temp 97.9 F (36.6 C)   Resp 16   Ht 5\' 3"  (1.6 m)   Wt 77.1 kg   LMP  (LMP Unknown)   SpO2 98%   BMI  30.11 kg/m  Physical Exam Vitals and nursing note reviewed.  Constitutional:      General: She is not in acute distress.    Appearance: She is well-developed.  HENT:     Head: Normocephalic and atraumatic.     Mouth/Throat:     Mouth: Mucous membranes are moist.     Pharynx: No oropharyngeal exudate or posterior oropharyngeal erythema.  Eyes:     Conjunctiva/sclera: Conjunctivae normal.  Cardiovascular:     Rate and Rhythm: Normal rate and regular rhythm.     Heart sounds: No murmur heard. Pulmonary:     Effort: Pulmonary effort is normal. No respiratory distress.     Breath sounds: Normal breath sounds.  Abdominal:     Palpations: Abdomen is soft.     Tenderness: There is no abdominal tenderness.  Musculoskeletal:        General: No swelling.     Cervical back: Neck supple.  Lymphadenopathy:     Cervical: Cervical adenopathy present.  Skin:    General: Skin is warm and dry.     Capillary Refill: Capillary refill takes less than 2 seconds.  Neurological:     Mental Status: She is alert.  Psychiatric:        Mood and Affect: Mood normal.     ED Results / Procedures /  Treatments   Labs (all labs ordered are listed, but only abnormal results are displayed) Labs Reviewed  RESP PANEL BY RT-PCR (RSV, FLU A&B, COVID)  [id number]  GROUP A STREP BY PCR    EKG None  Radiology DG Chest 2 View Result Date: 12/01/2023 CLINICAL DATA:  Productive cough. EXAM: CHEST - 2 VIEW COMPARISON:  10/10/2023. FINDINGS: Bilateral lung fields are clear. Bilateral costophrenic angles are clear. Note is again made of elevated left hemidiaphragm. Stable cardio-mediastinal silhouette. No acute osseous abnormalities. The soft tissues are within normal limits. IMPRESSION: No active cardiopulmonary disease. Electronically Signed   By: Jules Schick M.D.   On: 12/01/2023 13:53    Procedures Procedures    Medications Ordered in ED Medications - No data to display  ED Course/ Medical Decision  Making/ A&P                                 Medical Decision Making Amount and/or Complexity of Data Reviewed Radiology: ordered.     Differential diagnosis includes but is not limited to allergic rhinitis, sinusitis, COVID, flu, RSV, viral URI, strep pharyngitis, viral pharyngitis, allergic rhinitis, pneumonia, bronchitis   ED Course:  Patient well-appearing, stable vital signs.  Unremarkable physical exam.  Her COVID, flu, RSV, and strep test are negative.  Chest x-ray without signs of pneumonia.  Given her main concern is nasal congestion, suspect this is viral URI versus allergic rhinitis.  Patient stable and appropriate for discharge home  Impression: Nasal congestion  Disposition:  The patient was discharged home with instructions to use Flonase and cetirizine daily to help with allergy symptoms and congestion. Follow up with PCP if symptoms not improving within the next week. Return precautions given.  Imaging Studies ordered: I ordered imaging studies including chest x-ray I independently visualized the imaging with scope of interpretation limited to determining acute life threatening conditions related to emergency care. Imaging showed no acute abnormalities I agree with the radiologist interpretation              Final Clinical Impression(s) / ED Diagnoses Final diagnoses:  Nasal congestion    Rx / DC Orders ED Discharge Orders     None         Arabella Merles, PA-C 12/01/23 1423    Anders Simmonds T, DO 12/02/23 [phone]

## 2023-12-01 NOTE — ED Triage Notes (Signed)
 Pt. Stated, Ive had congestion for 2 weeks and spitting up a lot of mucous. It breaks my sleep up

## 2023-12-02 DIAGNOSIS — N2581 Secondary hyperparathyroidism of renal origin: Secondary | ICD-10-CM | POA: Diagnosis not present

## 2023-12-02 DIAGNOSIS — N186 End stage renal disease: Secondary | ICD-10-CM | POA: Diagnosis not present

## 2023-12-02 DIAGNOSIS — Z992 Dependence on renal dialysis: Secondary | ICD-10-CM | POA: Diagnosis not present

## 2023-12-02 DIAGNOSIS — D631 Anemia in chronic kidney disease: Secondary | ICD-10-CM | POA: Diagnosis not present

## 2023-12-02 DIAGNOSIS — D688 Other specified coagulation defects: Secondary | ICD-10-CM | POA: Diagnosis not present

## 2023-12-05 ENCOUNTER — Telehealth: Payer: Self-pay

## 2023-12-05 DIAGNOSIS — D631 Anemia in chronic kidney disease: Secondary | ICD-10-CM | POA: Diagnosis not present

## 2023-12-05 DIAGNOSIS — D688 Other specified coagulation defects: Secondary | ICD-10-CM | POA: Diagnosis not present

## 2023-12-05 DIAGNOSIS — Z992 Dependence on renal dialysis: Secondary | ICD-10-CM | POA: Diagnosis not present

## 2023-12-05 DIAGNOSIS — N186 End stage renal disease: Secondary | ICD-10-CM | POA: Diagnosis not present

## 2023-12-05 DIAGNOSIS — N2581 Secondary hyperparathyroidism of renal origin: Secondary | ICD-10-CM | POA: Diagnosis not present

## 2023-12-05 NOTE — Transitions of Care (Post Inpatient/ED Visit) (Signed)
   12/05/2023  Name: Victoria [name] MRN: [id number] DOB: May 11, 1956  Today's TOC FU Call Status: Today's TOC FU Call Status:: Unsuccessful Call (1st Attempt) Unsuccessful Call (1st Attempt) Date: 12/05/23  Attempted to reach the patient regarding the most recent Inpatient/ED visit.  Follow Up Plan: Additional outreach attempts will be made to reach the patient to complete the Transitions of Care (Post Inpatient/ED visit) call.   Signature  Bryanda Mikel D, CMA

## 2023-12-07 DIAGNOSIS — N2581 Secondary hyperparathyroidism of renal origin: Secondary | ICD-10-CM | POA: Diagnosis not present

## 2023-12-07 DIAGNOSIS — D688 Other specified coagulation defects: Secondary | ICD-10-CM | POA: Diagnosis not present

## 2023-12-07 DIAGNOSIS — Z992 Dependence on renal dialysis: Secondary | ICD-10-CM | POA: Diagnosis not present

## 2023-12-07 DIAGNOSIS — D631 Anemia in chronic kidney disease: Secondary | ICD-10-CM | POA: Diagnosis not present

## 2023-12-07 DIAGNOSIS — N186 End stage renal disease: Secondary | ICD-10-CM | POA: Diagnosis not present

## 2023-12-09 DIAGNOSIS — D688 Other specified coagulation defects: Secondary | ICD-10-CM | POA: Diagnosis not present

## 2023-12-09 DIAGNOSIS — Z992 Dependence on renal dialysis: Secondary | ICD-10-CM | POA: Diagnosis not present

## 2023-12-09 DIAGNOSIS — D631 Anemia in chronic kidney disease: Secondary | ICD-10-CM | POA: Diagnosis not present

## 2023-12-09 DIAGNOSIS — N186 End stage renal disease: Secondary | ICD-10-CM | POA: Diagnosis not present

## 2023-12-09 DIAGNOSIS — N2581 Secondary hyperparathyroidism of renal origin: Secondary | ICD-10-CM | POA: Diagnosis not present

## 2023-12-12 DIAGNOSIS — N186 End stage renal disease: Secondary | ICD-10-CM | POA: Diagnosis not present

## 2023-12-12 DIAGNOSIS — Z992 Dependence on renal dialysis: Secondary | ICD-10-CM | POA: Diagnosis not present

## 2023-12-12 DIAGNOSIS — D631 Anemia in chronic kidney disease: Secondary | ICD-10-CM | POA: Diagnosis not present

## 2023-12-12 DIAGNOSIS — D688 Other specified coagulation defects: Secondary | ICD-10-CM | POA: Diagnosis not present

## 2023-12-12 DIAGNOSIS — N2581 Secondary hyperparathyroidism of renal origin: Secondary | ICD-10-CM | POA: Diagnosis not present

## 2023-12-14 DIAGNOSIS — D688 Other specified coagulation defects: Secondary | ICD-10-CM | POA: Diagnosis not present

## 2023-12-14 DIAGNOSIS — Z992 Dependence on renal dialysis: Secondary | ICD-10-CM | POA: Diagnosis not present

## 2023-12-14 DIAGNOSIS — N186 End stage renal disease: Secondary | ICD-10-CM | POA: Diagnosis not present

## 2023-12-14 DIAGNOSIS — D631 Anemia in chronic kidney disease: Secondary | ICD-10-CM | POA: Diagnosis not present

## 2023-12-14 DIAGNOSIS — N2581 Secondary hyperparathyroidism of renal origin: Secondary | ICD-10-CM | POA: Diagnosis not present

## 2023-12-16 DIAGNOSIS — N186 End stage renal disease: Secondary | ICD-10-CM | POA: Diagnosis not present

## 2023-12-16 DIAGNOSIS — D631 Anemia in chronic kidney disease: Secondary | ICD-10-CM | POA: Diagnosis not present

## 2023-12-16 DIAGNOSIS — N2581 Secondary hyperparathyroidism of renal origin: Secondary | ICD-10-CM | POA: Diagnosis not present

## 2023-12-16 DIAGNOSIS — D688 Other specified coagulation defects: Secondary | ICD-10-CM | POA: Diagnosis not present

## 2023-12-16 DIAGNOSIS — Z992 Dependence on renal dialysis: Secondary | ICD-10-CM | POA: Diagnosis not present

## 2023-12-19 DIAGNOSIS — Z992 Dependence on renal dialysis: Secondary | ICD-10-CM | POA: Diagnosis not present

## 2023-12-19 DIAGNOSIS — N2581 Secondary hyperparathyroidism of renal origin: Secondary | ICD-10-CM | POA: Diagnosis not present

## 2023-12-19 DIAGNOSIS — D688 Other specified coagulation defects: Secondary | ICD-10-CM | POA: Diagnosis not present

## 2023-12-19 DIAGNOSIS — D631 Anemia in chronic kidney disease: Secondary | ICD-10-CM | POA: Diagnosis not present

## 2023-12-19 DIAGNOSIS — N186 End stage renal disease: Secondary | ICD-10-CM | POA: Diagnosis not present

## 2023-12-19 DIAGNOSIS — I120 Hypertensive chronic kidney disease with stage 5 chronic kidney disease or end stage renal disease: Secondary | ICD-10-CM | POA: Diagnosis not present

## 2023-12-21 DIAGNOSIS — D688 Other specified coagulation defects: Secondary | ICD-10-CM | POA: Diagnosis not present

## 2023-12-21 DIAGNOSIS — D631 Anemia in chronic kidney disease: Secondary | ICD-10-CM | POA: Diagnosis not present

## 2023-12-21 DIAGNOSIS — N2581 Secondary hyperparathyroidism of renal origin: Secondary | ICD-10-CM | POA: Diagnosis not present

## 2023-12-21 DIAGNOSIS — Z992 Dependence on renal dialysis: Secondary | ICD-10-CM | POA: Diagnosis not present

## 2023-12-21 DIAGNOSIS — N186 End stage renal disease: Secondary | ICD-10-CM | POA: Diagnosis not present

## 2023-12-23 DIAGNOSIS — D688 Other specified coagulation defects: Secondary | ICD-10-CM | POA: Diagnosis not present

## 2023-12-23 DIAGNOSIS — N186 End stage renal disease: Secondary | ICD-10-CM | POA: Diagnosis not present

## 2023-12-23 DIAGNOSIS — N2581 Secondary hyperparathyroidism of renal origin: Secondary | ICD-10-CM | POA: Diagnosis not present

## 2023-12-23 DIAGNOSIS — D631 Anemia in chronic kidney disease: Secondary | ICD-10-CM | POA: Diagnosis not present

## 2023-12-23 DIAGNOSIS — Z992 Dependence on renal dialysis: Secondary | ICD-10-CM | POA: Diagnosis not present

## 2023-12-26 DIAGNOSIS — N186 End stage renal disease: Secondary | ICD-10-CM | POA: Diagnosis not present

## 2023-12-26 DIAGNOSIS — D631 Anemia in chronic kidney disease: Secondary | ICD-10-CM | POA: Diagnosis not present

## 2023-12-26 DIAGNOSIS — Z992 Dependence on renal dialysis: Secondary | ICD-10-CM | POA: Diagnosis not present

## 2023-12-26 DIAGNOSIS — D688 Other specified coagulation defects: Secondary | ICD-10-CM | POA: Diagnosis not present

## 2023-12-26 DIAGNOSIS — N2581 Secondary hyperparathyroidism of renal origin: Secondary | ICD-10-CM | POA: Diagnosis not present

## 2023-12-27 NOTE — Progress Notes (Signed)
 Marland Kitchen

## 2023-12-28 DIAGNOSIS — N2581 Secondary hyperparathyroidism of renal origin: Secondary | ICD-10-CM | POA: Diagnosis not present

## 2023-12-28 DIAGNOSIS — Z992 Dependence on renal dialysis: Secondary | ICD-10-CM | POA: Diagnosis not present

## 2023-12-28 DIAGNOSIS — N186 End stage renal disease: Secondary | ICD-10-CM | POA: Diagnosis not present

## 2023-12-28 DIAGNOSIS — D688 Other specified coagulation defects: Secondary | ICD-10-CM | POA: Diagnosis not present

## 2023-12-28 DIAGNOSIS — D631 Anemia in chronic kidney disease: Secondary | ICD-10-CM | POA: Diagnosis not present

## 2023-12-30 DIAGNOSIS — N186 End stage renal disease: Secondary | ICD-10-CM | POA: Diagnosis not present

## 2023-12-30 DIAGNOSIS — Z992 Dependence on renal dialysis: Secondary | ICD-10-CM | POA: Diagnosis not present

## 2023-12-30 DIAGNOSIS — D631 Anemia in chronic kidney disease: Secondary | ICD-10-CM | POA: Diagnosis not present

## 2023-12-30 DIAGNOSIS — D688 Other specified coagulation defects: Secondary | ICD-10-CM | POA: Diagnosis not present

## 2023-12-30 DIAGNOSIS — N2581 Secondary hyperparathyroidism of renal origin: Secondary | ICD-10-CM | POA: Diagnosis not present

## 2024-01-02 DIAGNOSIS — N186 End stage renal disease: Secondary | ICD-10-CM | POA: Diagnosis not present

## 2024-01-02 DIAGNOSIS — Z992 Dependence on renal dialysis: Secondary | ICD-10-CM | POA: Diagnosis not present

## 2024-01-02 DIAGNOSIS — N2581 Secondary hyperparathyroidism of renal origin: Secondary | ICD-10-CM | POA: Diagnosis not present

## 2024-01-02 DIAGNOSIS — D631 Anemia in chronic kidney disease: Secondary | ICD-10-CM | POA: Diagnosis not present

## 2024-01-02 DIAGNOSIS — D688 Other specified coagulation defects: Secondary | ICD-10-CM | POA: Diagnosis not present

## 2024-01-04 DIAGNOSIS — D688 Other specified coagulation defects: Secondary | ICD-10-CM | POA: Diagnosis not present

## 2024-01-04 DIAGNOSIS — N186 End stage renal disease: Secondary | ICD-10-CM | POA: Diagnosis not present

## 2024-01-04 DIAGNOSIS — D631 Anemia in chronic kidney disease: Secondary | ICD-10-CM | POA: Diagnosis not present

## 2024-01-04 DIAGNOSIS — N2581 Secondary hyperparathyroidism of renal origin: Secondary | ICD-10-CM | POA: Diagnosis not present

## 2024-01-04 DIAGNOSIS — Z992 Dependence on renal dialysis: Secondary | ICD-10-CM | POA: Diagnosis not present

## 2024-01-06 DIAGNOSIS — N2581 Secondary hyperparathyroidism of renal origin: Secondary | ICD-10-CM | POA: Diagnosis not present

## 2024-01-06 DIAGNOSIS — Z992 Dependence on renal dialysis: Secondary | ICD-10-CM | POA: Diagnosis not present

## 2024-01-06 DIAGNOSIS — N186 End stage renal disease: Secondary | ICD-10-CM | POA: Diagnosis not present

## 2024-01-06 DIAGNOSIS — D631 Anemia in chronic kidney disease: Secondary | ICD-10-CM | POA: Diagnosis not present

## 2024-01-06 DIAGNOSIS — D688 Other specified coagulation defects: Secondary | ICD-10-CM | POA: Diagnosis not present

## 2024-01-09 DIAGNOSIS — D688 Other specified coagulation defects: Secondary | ICD-10-CM | POA: Diagnosis not present

## 2024-01-09 DIAGNOSIS — N186 End stage renal disease: Secondary | ICD-10-CM | POA: Diagnosis not present

## 2024-01-09 DIAGNOSIS — Z992 Dependence on renal dialysis: Secondary | ICD-10-CM | POA: Diagnosis not present

## 2024-01-09 DIAGNOSIS — N2581 Secondary hyperparathyroidism of renal origin: Secondary | ICD-10-CM | POA: Diagnosis not present

## 2024-01-09 DIAGNOSIS — D631 Anemia in chronic kidney disease: Secondary | ICD-10-CM | POA: Diagnosis not present

## 2024-01-11 DIAGNOSIS — N186 End stage renal disease: Secondary | ICD-10-CM | POA: Diagnosis not present

## 2024-01-11 DIAGNOSIS — Z992 Dependence on renal dialysis: Secondary | ICD-10-CM | POA: Diagnosis not present

## 2024-01-11 DIAGNOSIS — D688 Other specified coagulation defects: Secondary | ICD-10-CM | POA: Diagnosis not present

## 2024-01-11 DIAGNOSIS — D631 Anemia in chronic kidney disease: Secondary | ICD-10-CM | POA: Diagnosis not present

## 2024-01-11 DIAGNOSIS — N2581 Secondary hyperparathyroidism of renal origin: Secondary | ICD-10-CM | POA: Diagnosis not present

## 2024-01-13 DIAGNOSIS — Z992 Dependence on renal dialysis: Secondary | ICD-10-CM | POA: Diagnosis not present

## 2024-01-13 DIAGNOSIS — D631 Anemia in chronic kidney disease: Secondary | ICD-10-CM | POA: Diagnosis not present

## 2024-01-13 DIAGNOSIS — D688 Other specified coagulation defects: Secondary | ICD-10-CM | POA: Diagnosis not present

## 2024-01-13 DIAGNOSIS — N186 End stage renal disease: Secondary | ICD-10-CM | POA: Diagnosis not present

## 2024-01-13 DIAGNOSIS — N2581 Secondary hyperparathyroidism of renal origin: Secondary | ICD-10-CM | POA: Diagnosis not present

## 2024-01-16 DIAGNOSIS — Z992 Dependence on renal dialysis: Secondary | ICD-10-CM | POA: Diagnosis not present

## 2024-01-16 DIAGNOSIS — N2581 Secondary hyperparathyroidism of renal origin: Secondary | ICD-10-CM | POA: Diagnosis not present

## 2024-01-16 DIAGNOSIS — D631 Anemia in chronic kidney disease: Secondary | ICD-10-CM | POA: Diagnosis not present

## 2024-01-16 DIAGNOSIS — N186 End stage renal disease: Secondary | ICD-10-CM | POA: Diagnosis not present

## 2024-01-16 DIAGNOSIS — D688 Other specified coagulation defects: Secondary | ICD-10-CM | POA: Diagnosis not present

## 2024-01-18 DIAGNOSIS — Z992 Dependence on renal dialysis: Secondary | ICD-10-CM | POA: Diagnosis not present

## 2024-01-18 DIAGNOSIS — I120 Hypertensive chronic kidney disease with stage 5 chronic kidney disease or end stage renal disease: Secondary | ICD-10-CM | POA: Diagnosis not present

## 2024-01-18 DIAGNOSIS — N186 End stage renal disease: Secondary | ICD-10-CM | POA: Diagnosis not present

## 2024-01-18 DIAGNOSIS — D688 Other specified coagulation defects: Secondary | ICD-10-CM | POA: Diagnosis not present

## 2024-01-18 DIAGNOSIS — D631 Anemia in chronic kidney disease: Secondary | ICD-10-CM | POA: Diagnosis not present

## 2024-01-18 DIAGNOSIS — N2581 Secondary hyperparathyroidism of renal origin: Secondary | ICD-10-CM | POA: Diagnosis not present

## 2024-01-20 DIAGNOSIS — D631 Anemia in chronic kidney disease: Secondary | ICD-10-CM | POA: Diagnosis not present

## 2024-01-20 DIAGNOSIS — Z992 Dependence on renal dialysis: Secondary | ICD-10-CM | POA: Diagnosis not present

## 2024-01-20 DIAGNOSIS — N186 End stage renal disease: Secondary | ICD-10-CM | POA: Diagnosis not present

## 2024-01-20 DIAGNOSIS — D688 Other specified coagulation defects: Secondary | ICD-10-CM | POA: Diagnosis not present

## 2024-01-20 DIAGNOSIS — N2581 Secondary hyperparathyroidism of renal origin: Secondary | ICD-10-CM | POA: Diagnosis not present

## 2024-01-23 DIAGNOSIS — N2581 Secondary hyperparathyroidism of renal origin: Secondary | ICD-10-CM | POA: Diagnosis not present

## 2024-01-23 DIAGNOSIS — D631 Anemia in chronic kidney disease: Secondary | ICD-10-CM | POA: Diagnosis not present

## 2024-01-23 DIAGNOSIS — N186 End stage renal disease: Secondary | ICD-10-CM | POA: Diagnosis not present

## 2024-01-23 DIAGNOSIS — D688 Other specified coagulation defects: Secondary | ICD-10-CM | POA: Diagnosis not present

## 2024-01-23 DIAGNOSIS — Z992 Dependence on renal dialysis: Secondary | ICD-10-CM | POA: Diagnosis not present

## 2024-01-25 DIAGNOSIS — N2581 Secondary hyperparathyroidism of renal origin: Secondary | ICD-10-CM | POA: Diagnosis not present

## 2024-01-25 DIAGNOSIS — Z992 Dependence on renal dialysis: Secondary | ICD-10-CM | POA: Diagnosis not present

## 2024-01-25 DIAGNOSIS — D631 Anemia in chronic kidney disease: Secondary | ICD-10-CM | POA: Diagnosis not present

## 2024-01-25 DIAGNOSIS — N186 End stage renal disease: Secondary | ICD-10-CM | POA: Diagnosis not present

## 2024-01-25 DIAGNOSIS — D688 Other specified coagulation defects: Secondary | ICD-10-CM | POA: Diagnosis not present

## 2024-01-27 DIAGNOSIS — N2581 Secondary hyperparathyroidism of renal origin: Secondary | ICD-10-CM | POA: Diagnosis not present

## 2024-01-27 DIAGNOSIS — D688 Other specified coagulation defects: Secondary | ICD-10-CM | POA: Diagnosis not present

## 2024-01-27 DIAGNOSIS — N186 End stage renal disease: Secondary | ICD-10-CM | POA: Diagnosis not present

## 2024-01-27 DIAGNOSIS — D631 Anemia in chronic kidney disease: Secondary | ICD-10-CM | POA: Diagnosis not present

## 2024-01-27 DIAGNOSIS — Z992 Dependence on renal dialysis: Secondary | ICD-10-CM | POA: Diagnosis not present

## 2024-01-30 DIAGNOSIS — N186 End stage renal disease: Secondary | ICD-10-CM | POA: Diagnosis not present

## 2024-01-30 DIAGNOSIS — Z992 Dependence on renal dialysis: Secondary | ICD-10-CM | POA: Diagnosis not present

## 2024-01-30 DIAGNOSIS — N2581 Secondary hyperparathyroidism of renal origin: Secondary | ICD-10-CM | POA: Diagnosis not present

## 2024-01-30 DIAGNOSIS — D631 Anemia in chronic kidney disease: Secondary | ICD-10-CM | POA: Diagnosis not present

## 2024-01-30 DIAGNOSIS — D688 Other specified coagulation defects: Secondary | ICD-10-CM | POA: Diagnosis not present

## 2024-02-01 DIAGNOSIS — N186 End stage renal disease: Secondary | ICD-10-CM | POA: Diagnosis not present

## 2024-02-01 DIAGNOSIS — D631 Anemia in chronic kidney disease: Secondary | ICD-10-CM | POA: Diagnosis not present

## 2024-02-01 DIAGNOSIS — N2581 Secondary hyperparathyroidism of renal origin: Secondary | ICD-10-CM | POA: Diagnosis not present

## 2024-02-01 DIAGNOSIS — D688 Other specified coagulation defects: Secondary | ICD-10-CM | POA: Diagnosis not present

## 2024-02-01 DIAGNOSIS — Z992 Dependence on renal dialysis: Secondary | ICD-10-CM | POA: Diagnosis not present

## 2024-02-03 DIAGNOSIS — D688 Other specified coagulation defects: Secondary | ICD-10-CM | POA: Diagnosis not present

## 2024-02-03 DIAGNOSIS — N2581 Secondary hyperparathyroidism of renal origin: Secondary | ICD-10-CM | POA: Diagnosis not present

## 2024-02-03 DIAGNOSIS — D631 Anemia in chronic kidney disease: Secondary | ICD-10-CM | POA: Diagnosis not present

## 2024-02-03 DIAGNOSIS — N186 End stage renal disease: Secondary | ICD-10-CM | POA: Diagnosis not present

## 2024-02-03 DIAGNOSIS — Z992 Dependence on renal dialysis: Secondary | ICD-10-CM | POA: Diagnosis not present

## 2024-02-06 DIAGNOSIS — D631 Anemia in chronic kidney disease: Secondary | ICD-10-CM | POA: Diagnosis not present

## 2024-02-06 DIAGNOSIS — N2581 Secondary hyperparathyroidism of renal origin: Secondary | ICD-10-CM | POA: Diagnosis not present

## 2024-02-06 DIAGNOSIS — N186 End stage renal disease: Secondary | ICD-10-CM | POA: Diagnosis not present

## 2024-02-06 DIAGNOSIS — Z992 Dependence on renal dialysis: Secondary | ICD-10-CM | POA: Diagnosis not present

## 2024-02-06 DIAGNOSIS — D688 Other specified coagulation defects: Secondary | ICD-10-CM | POA: Diagnosis not present

## 2024-02-08 DIAGNOSIS — Z992 Dependence on renal dialysis: Secondary | ICD-10-CM | POA: Diagnosis not present

## 2024-02-08 DIAGNOSIS — D631 Anemia in chronic kidney disease: Secondary | ICD-10-CM | POA: Diagnosis not present

## 2024-02-08 DIAGNOSIS — N2581 Secondary hyperparathyroidism of renal origin: Secondary | ICD-10-CM | POA: Diagnosis not present

## 2024-02-08 DIAGNOSIS — D688 Other specified coagulation defects: Secondary | ICD-10-CM | POA: Diagnosis not present

## 2024-02-08 DIAGNOSIS — N186 End stage renal disease: Secondary | ICD-10-CM | POA: Diagnosis not present

## 2024-02-10 DIAGNOSIS — D631 Anemia in chronic kidney disease: Secondary | ICD-10-CM | POA: Diagnosis not present

## 2024-02-10 DIAGNOSIS — D688 Other specified coagulation defects: Secondary | ICD-10-CM | POA: Diagnosis not present

## 2024-02-10 DIAGNOSIS — N2581 Secondary hyperparathyroidism of renal origin: Secondary | ICD-10-CM | POA: Diagnosis not present

## 2024-02-10 DIAGNOSIS — N186 End stage renal disease: Secondary | ICD-10-CM | POA: Diagnosis not present

## 2024-02-10 DIAGNOSIS — Z992 Dependence on renal dialysis: Secondary | ICD-10-CM | POA: Diagnosis not present

## 2024-02-13 DIAGNOSIS — N186 End stage renal disease: Secondary | ICD-10-CM | POA: Diagnosis not present

## 2024-02-13 DIAGNOSIS — N2581 Secondary hyperparathyroidism of renal origin: Secondary | ICD-10-CM | POA: Diagnosis not present

## 2024-02-13 DIAGNOSIS — D688 Other specified coagulation defects: Secondary | ICD-10-CM | POA: Diagnosis not present

## 2024-02-13 DIAGNOSIS — D631 Anemia in chronic kidney disease: Secondary | ICD-10-CM | POA: Diagnosis not present

## 2024-02-13 DIAGNOSIS — Z992 Dependence on renal dialysis: Secondary | ICD-10-CM | POA: Diagnosis not present

## 2024-02-15 DIAGNOSIS — D688 Other specified coagulation defects: Secondary | ICD-10-CM | POA: Diagnosis not present

## 2024-02-15 DIAGNOSIS — D631 Anemia in chronic kidney disease: Secondary | ICD-10-CM | POA: Diagnosis not present

## 2024-02-15 DIAGNOSIS — N2581 Secondary hyperparathyroidism of renal origin: Secondary | ICD-10-CM | POA: Diagnosis not present

## 2024-02-15 DIAGNOSIS — N186 End stage renal disease: Secondary | ICD-10-CM | POA: Diagnosis not present

## 2024-02-15 DIAGNOSIS — Z992 Dependence on renal dialysis: Secondary | ICD-10-CM | POA: Diagnosis not present

## 2024-02-17 DIAGNOSIS — N186 End stage renal disease: Secondary | ICD-10-CM | POA: Diagnosis not present

## 2024-02-17 DIAGNOSIS — D631 Anemia in chronic kidney disease: Secondary | ICD-10-CM | POA: Diagnosis not present

## 2024-02-17 DIAGNOSIS — Z992 Dependence on renal dialysis: Secondary | ICD-10-CM | POA: Diagnosis not present

## 2024-02-17 DIAGNOSIS — N2581 Secondary hyperparathyroidism of renal origin: Secondary | ICD-10-CM | POA: Diagnosis not present

## 2024-02-17 DIAGNOSIS — D688 Other specified coagulation defects: Secondary | ICD-10-CM | POA: Diagnosis not present

## 2024-02-18 DIAGNOSIS — N186 End stage renal disease: Secondary | ICD-10-CM | POA: Diagnosis not present

## 2024-02-18 DIAGNOSIS — Z992 Dependence on renal dialysis: Secondary | ICD-10-CM | POA: Diagnosis not present

## 2024-02-18 DIAGNOSIS — I120 Hypertensive chronic kidney disease with stage 5 chronic kidney disease or end stage renal disease: Secondary | ICD-10-CM | POA: Diagnosis not present

## 2024-02-20 DIAGNOSIS — R52 Pain, unspecified: Secondary | ICD-10-CM | POA: Diagnosis not present

## 2024-02-20 DIAGNOSIS — N186 End stage renal disease: Secondary | ICD-10-CM | POA: Diagnosis not present

## 2024-02-20 DIAGNOSIS — D631 Anemia in chronic kidney disease: Secondary | ICD-10-CM | POA: Diagnosis not present

## 2024-02-20 DIAGNOSIS — N2581 Secondary hyperparathyroidism of renal origin: Secondary | ICD-10-CM | POA: Diagnosis not present

## 2024-02-20 DIAGNOSIS — D688 Other specified coagulation defects: Secondary | ICD-10-CM | POA: Diagnosis not present

## 2024-02-20 DIAGNOSIS — Z992 Dependence on renal dialysis: Secondary | ICD-10-CM | POA: Diagnosis not present

## 2024-02-22 DIAGNOSIS — D688 Other specified coagulation defects: Secondary | ICD-10-CM | POA: Diagnosis not present

## 2024-02-22 DIAGNOSIS — R52 Pain, unspecified: Secondary | ICD-10-CM | POA: Diagnosis not present

## 2024-02-22 DIAGNOSIS — N186 End stage renal disease: Secondary | ICD-10-CM | POA: Diagnosis not present

## 2024-02-22 DIAGNOSIS — D631 Anemia in chronic kidney disease: Secondary | ICD-10-CM | POA: Diagnosis not present

## 2024-02-22 DIAGNOSIS — N2581 Secondary hyperparathyroidism of renal origin: Secondary | ICD-10-CM | POA: Diagnosis not present

## 2024-02-22 DIAGNOSIS — Z992 Dependence on renal dialysis: Secondary | ICD-10-CM | POA: Diagnosis not present

## 2024-02-23 ENCOUNTER — Ambulatory Visit (HOSPITAL_COMMUNITY)
Admission: RE | Admit: 2024-02-23 | Discharge: 2024-02-23 | Disposition: A | Discharge status: Home / Self Care | Attending: Vascular Surgery | Admitting: Vascular Surgery

## 2024-02-23 ENCOUNTER — Other Ambulatory Visit: Payer: Self-pay

## 2024-02-23 ENCOUNTER — Encounter (HOSPITAL_COMMUNITY)
Admission: RE | Disposition: A | Discharge status: Home / Self Care | Payer: Self-pay | Source: Home / Self Care | Attending: Vascular Surgery

## 2024-02-23 DIAGNOSIS — Z992 Dependence on renal dialysis: Secondary | ICD-10-CM

## 2024-02-23 DIAGNOSIS — Y832 Surgical operation with anastomosis, bypass or graft as the cause of abnormal reaction of the patient, or of later complication, without mention of misadventure at the time of the procedure: Secondary | ICD-10-CM | POA: Insufficient documentation

## 2024-02-23 DIAGNOSIS — N186 End stage renal disease: Secondary | ICD-10-CM

## 2024-02-23 DIAGNOSIS — T82858A Stenosis of vascular prosthetic devices, implants and grafts, initial encounter: Secondary | ICD-10-CM | POA: Diagnosis not present

## 2024-02-23 DIAGNOSIS — T82898A Other specified complication of vascular prosthetic devices, implants and grafts, initial encounter: Secondary | ICD-10-CM

## 2024-02-23 DIAGNOSIS — I120 Hypertensive chronic kidney disease with stage 5 chronic kidney disease or end stage renal disease: Secondary | ICD-10-CM | POA: Diagnosis not present

## 2024-02-23 HISTORY — PX: A/V SHUNT INTERVENTION: CATH118220

## 2024-02-23 HISTORY — PX: VENOUS ANGIOPLASTY: CATH118376

## 2024-02-23 SURGERY — A/V SHUNT INTERVENTION
Anesthesia: LOCAL

## 2024-02-23 MED ORDER — LIDOCAINE HCL (PF) 1 % IJ SOLN
INTRAMUSCULAR | Status: DC | PRN
  Administered 2024-02-23: 5 mL

## 2024-02-23 MED ORDER — IODIXANOL 320 MG/ML IV SOLN
INTRAVENOUS | Status: DC | PRN
  Administered 2024-02-23: 20 mL via INTRAVENOUS

## 2024-02-23 MED ORDER — HEPARIN (PORCINE) IN NACL 1000-0.9 UT/500ML-% IV SOLN
INTRAVENOUS | Status: DC | PRN
  Administered 2024-02-23: 500 mL

## 2024-02-23 SURGICAL SUPPLY — 9 items
BALLOON MUSTANG 7X80X75 (BALLOONS) IMPLANT
GUIDEWIRE ANGLED .035 180CM (WIRE) IMPLANT
KIT ENCORE 40 (KITS) IMPLANT
KIT MICROPUNCTURE NIT STIFF (SHEATH) IMPLANT
SHEATH PINNACLE R/O II 6F 4CM (SHEATH) IMPLANT
SHEATH PROBE COVER 6X72 (BAG) IMPLANT
STOPCOCK MORSE 400PSI 3WAY (MISCELLANEOUS) IMPLANT
TRAY PV CATH (CUSTOM PROCEDURE TRAY) IMPLANT
TUBING CIL FLEX 10 FLL-RA (TUBING) IMPLANT

## 2024-02-23 NOTE — H&P (Signed)
 HD ACCESS CENTER H&P   Patient ID: Victoria [name], female   DOB: 1956-08-23, 68 y.o.   MRN: [id number]  Subjective:     HPI [name] is a 68 y.o. female with ESRD presenting to the HD access center for intervention.  Past Medical History:  Diagnosis Date   Arthritis    Chronic bronchitis (HCC)    Complication of anesthesia ~ 2011   "they gave me a medicine that swolled me and mouth burning up" (08/23/2012)   Complication of anesthesia    slow to wake up   ESRD (end stage renal disease) on dialysis [facility]) Nephrologist-- dr deterding   ESRD due to HTN-; "M/W/F; [address]" (11/28/2015   GERD (gastroesophageal reflux disease)    no meds, diet controlled   History of acute pulmonary edema    2003   Hypertension    no longer on medications   Left patella fracture    Pneumonia    as a child   Seasonal allergies    Secondary hyperparathyroidism, renal (HCC)    s/p  total parathyroidectomy  2014   Thyroid  disease    Wears glasses    Wound dehiscence, surgical 11/28/2015   Family History  Problem Relation Age of Onset   Healthy Mother    Hypertension Father    Kidney failure Father    Hypertension Sister    Thyroid  disease Sister    Past Surgical History:  Procedure Laterality Date   ANKLE FRACTURE SURGERY Right ~ 2005   "has pins in it"   APPLICATION OF WOUND VAC Left 11/28/2015   knee   APPLICATION OF WOUND VAC Left 11/28/2015   Procedure: APPLICATION OF WOUND VAC;  Surgeon: Adonica Hoose, MD;  Location: [facility] OR;  Service: Orthopedics;  Laterality: Left;   ARTERIOVENOUS GRAFT PLACEMENT  03/25/2005   Right forearm  w/  multiple Revision's    AV FISTULA PLACEMENT Right 04/13/2022   Procedure: INSERTION OF RIGHT ARTERIOVENOUS (AV) 4-73mm x 45cm GORE-TEX GRAFT;  Surgeon: Dannis Dy, MD;  Location: [facility] OR;  Service: Vascular;  Laterality: Right;   CARDIOVASCULAR STRESS TEST  08/24/2012   abnormal nuclear study/  inferolateral and anteroseptal areas of scar,   no ischemia/  normal LV function and wall motion, ef 77%   CATARACT EXTRACTION W/ INTRAOCULAR LENS IMPLANT Bilateral    cataract removal bilateral, lens implant in right eye   DIALYSIS FISTULA CREATION  09/20/1990   left upper arm ---  w/  Multiple Revision's until 2006   ECTOPIC PREGNANCY SURGERY  05/21/1969   FRACTURE SURGERY     HARDWARE REMOVAL Right 07/10/2020   Procedure: Removal of deep implant right fibula and tibia; steroid injection right ankle;  Surgeon: Amada Backer, MD;  Location: [facility] OR;  Service: Orthopedics;  Laterality: Right;   I & D EXTREMITY Left 11/28/2015   Procedure: IRRIGATION AND DEBRIDEMENT LEFT KNEE WOUND ;  Surgeon: Adonica Hoose, MD;  Location: [facility] OR;  Service: Orthopedics;  Laterality: Left;   INCISION AND DRAINAGE OF WOUND Left 11/28/2015   knee   INSERTION OF DIALYSIS CATHETER Right 03/07/2023   Procedure: Cephas Collier GUIDED INSERTION OF TUNNELED DIALYSIS CATHETER;  Surgeon: Carlene Che, MD;  Location: [facility] OR;  Service: Vascular;  Laterality: Right;   IR FLUORO GUIDE CV LINE LEFT  02/09/2022   ORIF PATELLA Left 10/31/2015   Procedure: OPEN REDUCTION INTERNAL (ORIF) FIXATION LEFT PATELLA;  Surgeon: Adonica Hoose, MD;  Location: [facility];  Service: Orthopedics;  Laterality: Left;   PATELLAR TENDON REPAIR  10/03/2012   Procedure: PATELLA TENDON REPAIR;  Surgeon: Glo Larch, MD;  Location: [facility] OR;  Service: Orthopedics;  Laterality: Right;   PATELLECTOMY  10/03/2012   Procedure: PATELLECTOMY;  Surgeon: Glo Larch, MD;  Location: [facility] OR;  Service: Orthopedics;  Laterality: Right;  RIGHT PARTIAL PATELLECTOMY AND PATELLA TENDON REPAIR   REVISION OF ARTERIOVENOUS GORETEX GRAFT  09/27/2012   Procedure: REVISION OF ARTERIOVENOUS GORETEX GRAFT;  Surgeon: Palma Bob, MD;  Location: [facility] OR;  Service: Vascular;  Laterality: Right;  1) Replacement of venous half of loop with 6mm Gortex graft  2) Excision of erroded pseudoaneurysm of graft with  primary closure.   REVISION OF ARTERIOVENOUS GORETEX GRAFT Right 01/23/2013   Procedure: REVISION OF ARTERIOVENOUS GORETEX GRAFT;  Surgeon: Arvil Lauber, MD;  Location: [facility] OR;  Service: Vascular;  Laterality: Right;  Using piece of 6mm x 20cm Gortex graft.    REVISION OF ARTERIOVENOUS GORETEX GRAFT Right 10/07/2015   Procedure: REVISION OF Right arm ARTERIOVENOUS GORETEX GRAFT;  Surgeon: Richrd Char, MD;  Location: [facility] OR;  Service: Vascular;  Laterality: Right;   REVISION OF ARTERIOVENOUS GORETEX GRAFT Right 02/17/2016   Procedure: REVISION OF ARTERIOVENOUS GORETEX GRAFT;  Surgeon: Richrd Char, MD;  Location: [facility] OR;  Service: Vascular;  Laterality: Right;   REVISON OF ARTERIOVENOUS FISTULA Right 03/07/2023   Procedure: REVISON OF ARTERIOVENOUS FISTULA ARM;  Surgeon: Carlene Che, MD;  Location: [facility] OR;  Service: Vascular;  Laterality: Right;   TOOTH EXTRACTION     10/2021   TOTAL ABDOMINAL HYSTERECTOMY  03/05/2005   w/  Right Ovarian Cystectomy   TOTAL PARATHYROIDECTOMY/  THYROID  ISTHMUSECTOMY/  AUTOTRANSPLANTATION PARATHYROID  TISSUE TO LEFT BRACHIORIADIALIS MUSCLE  02/18/2011   TRANSTHORACIC ECHOCARDIOGRAM  10/31/2012   mild LVH,  grade 1 diastolic dysfunction,  ef 55-65%/  mild MR/  trivial TR    Short Social History:  Social History   Tobacco Use   Smoking status: Never    Passive exposure: Never   Smokeless tobacco: Never  Substance Use Topics   Alcohol use: Not Currently    Comment: occasionally drinks a few beers    Allergies  Allergen Reactions   Amlodipine Swelling   Cefazolin      Pt received Ancef  on 10/21.2021 without issue   Cephalosporins Anaphylaxis    Pt received Ancef  on 07/10/2020 without issue   Lisinopril Swelling and Other (See Comments)    "made my chest hurt"    No current facility-administered medications for this encounter.    REVIEW OF SYSTEMS All other systems were reviewed and are negative     Objective:   Objective   Vitals:    02/23/24 1002 02/23/24 1008  BP: (!) 100/49 (!) 100/49  Pulse: 64 70  Resp: 12 12  Temp: 98.2 F (36.8 C)   TempSrc: Oral   SpO2: 98% 94%   There is no height or weight on file to calculate BMI.  Physical Exam General: no acute distress Cardiac: hemodynamically stable Extremities: Multiple scars right upper extremity, palpable thrill and pulse in right upper extremity graft     Assessment/Plan:   Victoria [name] is a 68 y.o. female with ESRD presenting for fistulogram.  Having issues with low flows during sessions. Last HD session yesterday. Reviewed risks and benefits of fistulogram with intervention and patient agreed to proceed.   Delaney Fearing, MD [facility]

## 2024-02-23 NOTE — Op Note (Signed)
    Patient name: Victoria [name] MRN: [id number] DOB: 1956-07-11 Sex: female  02/23/2024 Pre-operative Diagnosis: ESRD on HD with low flows during HD session Post-operative diagnosis:  Same Surgeon:  Philipp Brawn, MD Procedure Performed:  Ultrasound-guided access of right upper extremity AVG Fistulogram and central venogram Balloon angioplasty of AVG and outflow vein with 7 mm x 80 mm Mustang balloon   Indications: [name] is a 68 year old female with ESRD on HD who was presented to the HD access center for fistulogram with possible intervention.  She has been having issues with low flows during sessions.  Her last HD session was yesterday.  We discussed there are some benefits of fistulogram with possible intervention, she elected to proceed.  Findings:  Widely patent central venous system, large subclavian and axillary veins.  Multifocal severe greater than 70% stenoses in the distal AVG and brachial vein.  Widely patent arterial anastomosis   Procedure:  The patient was identified in the holding area and taken to the cath lab  The patient was then placed supine on the table and prepped and draped in the usual sterile fashion.  A time out was called.  Ultrasound was used to evaluate the right arm AV access. This was accessed under u/s guidance. An 018 wire was advanced without resistance, a micropuncture sheath was placed and fistulagram obtained which demonstrated the above findings.  This access was then upsized to a 6 F short sheath over a glidewire.  The Glidewire was then used to cross the lesions and was placed into the central system.  The lesions were treated with a 7 mm x 80 mm Mustang balloon.  Completion angiography demonstrated minimal residual stenosis.  There was a improvement in the thrill on completion.  Contrast: 20 cc  Impression: Excellent response to balloon angioplasty of the distal AVG and brachial vein stenoses.   Philipp Brawn MD [facility] [place] Office: [phone]

## 2024-02-24 ENCOUNTER — Other Ambulatory Visit: Payer: Self-pay

## 2024-02-24 ENCOUNTER — Encounter (HOSPITAL_COMMUNITY): Payer: Self-pay | Admitting: Vascular Surgery

## 2024-02-24 ENCOUNTER — Encounter (HOSPITAL_COMMUNITY): Disposition: A | Discharge status: Home / Self Care | Payer: Self-pay | Source: Ambulatory Visit | Attending: Surgery

## 2024-02-24 ENCOUNTER — Ambulatory Visit (HOSPITAL_COMMUNITY)
Admit: 2024-02-24 | Discharge: 2024-02-24 | Disposition: A | Discharge status: Home / Self Care | Source: Ambulatory Visit | Attending: Surgery | Admitting: Surgery

## 2024-02-24 DIAGNOSIS — T82818A Embolism of vascular prosthetic devices, implants and grafts, initial encounter: Secondary | ICD-10-CM | POA: Diagnosis not present

## 2024-02-24 DIAGNOSIS — T82858A Stenosis of vascular prosthetic devices, implants and grafts, initial encounter: Secondary | ICD-10-CM | POA: Diagnosis not present

## 2024-02-24 DIAGNOSIS — I120 Hypertensive chronic kidney disease with stage 5 chronic kidney disease or end stage renal disease: Secondary | ICD-10-CM | POA: Diagnosis not present

## 2024-02-24 DIAGNOSIS — T82898A Other specified complication of vascular prosthetic devices, implants and grafts, initial encounter: Secondary | ICD-10-CM

## 2024-02-24 DIAGNOSIS — Z992 Dependence on renal dialysis: Secondary | ICD-10-CM | POA: Diagnosis not present

## 2024-02-24 DIAGNOSIS — T82868A Thrombosis of vascular prosthetic devices, implants and grafts, initial encounter: Secondary | ICD-10-CM | POA: Insufficient documentation

## 2024-02-24 DIAGNOSIS — N186 End stage renal disease: Secondary | ICD-10-CM | POA: Diagnosis not present

## 2024-02-24 DIAGNOSIS — Y832 Surgical operation with anastomosis, bypass or graft as the cause of abnormal reaction of the patient, or of later complication, without mention of misadventure at the time of the procedure: Secondary | ICD-10-CM | POA: Insufficient documentation

## 2024-02-24 HISTORY — PX: PERIPHERAL VASCULAR THROMBECTOMY: CATH118306

## 2024-02-24 HISTORY — PX: A/V SHUNT INTERVENTION: CATH118220

## 2024-02-24 HISTORY — PX: VENOUS STENT: CATH118377

## 2024-02-24 SURGERY — A/V SHUNT INTERVENTION
Anesthesia: LOCAL

## 2024-02-24 MED ORDER — HEPARIN (PORCINE) IN NACL 1000-0.9 UT/500ML-% IV SOLN
INTRAVENOUS | Status: DC | PRN
  Administered 2024-02-24: 500 mL

## 2024-02-24 MED ORDER — LIDOCAINE HCL (PF) 1 % IJ SOLN
INTRAMUSCULAR | Status: AC
  Filled 2024-02-24: qty 30

## 2024-02-24 MED ORDER — HEPARIN SODIUM (PORCINE) 1000 UNIT/ML IJ SOLN
INTRAMUSCULAR | Status: AC
  Filled 2024-02-24: qty 10

## 2024-02-24 MED ORDER — IODIXANOL 320 MG/ML IV SOLN
INTRAVENOUS | Status: DC | PRN
  Administered 2024-02-24: 20 mL via INTRAVENOUS

## 2024-02-24 MED ORDER — HEPARIN SODIUM (PORCINE) 1000 UNIT/ML IJ SOLN
INTRAMUSCULAR | Status: DC | PRN
  Administered 2024-02-24: 8000 [IU] via INTRAVENOUS

## 2024-02-24 MED ORDER — LIDOCAINE HCL (PF) 1 % IJ SOLN
INTRAMUSCULAR | Status: DC | PRN
  Administered 2024-02-24: 10 mL

## 2024-02-24 MED ORDER — MIDAZOLAM HCL 2 MG/2ML IJ SOLN
INTRAMUSCULAR | Status: AC
  Filled 2024-02-24: qty 2

## 2024-02-24 MED ORDER — MIDAZOLAM HCL 2 MG/2ML IJ SOLN
INTRAMUSCULAR | Status: DC | PRN
  Administered 2024-02-24: 1 mg via INTRAVENOUS

## 2024-02-24 SURGICAL SUPPLY — 17 items
BALLOON FOGARTY 5FR 40 (CATHETERS) IMPLANT
BALLOON MUSTANG 7.0X40 75 (BALLOONS) IMPLANT
BALLOON MUSTANG 8X80X75 (BALLOONS) IMPLANT
CATH GUIDE BRITETIP 7F (CATHETERS) IMPLANT
CATH MACH 1 ST 7FR 55 (CATHETERS) IMPLANT
GLIDEWIRE ADV .035X180CM (WIRE) IMPLANT
GUIDEWIRE ANGLED .035 180CM (WIRE) IMPLANT
KIT ENCORE 40 (KITS) IMPLANT
KIT MICROPUNCTURE NIT STIFF (SHEATH) IMPLANT
KIT PV (KITS) ×3 IMPLANT
SHEATH PINNACLE 8F 10CM (SHEATH) IMPLANT
SHEATH PINNACLE R/O II 7F 4CM (SHEATH) IMPLANT
SHEATH PROBE COVER 6X72 (BAG) IMPLANT
STENT VIABAHN 8X100X75 (Permanent Stent) IMPLANT
TRAY PV CATH (CUSTOM PROCEDURE TRAY) ×3 IMPLANT
WIRE BENTSON .035X145CM (WIRE) IMPLANT
WIRE TORQFLEX AUST .018X40CM (WIRE) IMPLANT

## 2024-02-24 NOTE — H&P (Signed)
 [facility] [place]  Patient name: Victoria [name] MRN: [id number] DOB: 03-07-1956 Sex: female   REASON FOR VISIT:    Clotted dialysis graft  HISOTRY OF PRESENT ILLNESS:    Victoria [name] is a 68 y.o. female who is status post right arm dialysis graft by Dr. Shaunna Delaware on 04/13/2022.  She came in for low flows yesterday and underwent successful balloon venoplasty of the outflow vein.  Unfortunately today she went to dialysis and they could not access her graft.   PAST MEDICAL HISTORY:   Past Medical History:  Diagnosis Date   Arthritis    Chronic bronchitis (HCC)    Complication of anesthesia ~ 2011   "they gave me a medicine that swolled me and mouth burning up" (08/23/2012)   Complication of anesthesia    slow to wake up   ESRD (end stage renal disease) on dialysis [facility]) Nephrologist-- dr deterding   ESRD due to HTN-; "M/W/F; [address]" (11/28/2015   GERD (gastroesophageal reflux disease)    no meds, diet controlled   History of acute pulmonary edema    2003   Hypertension    no longer on medications   Left patella fracture    Pneumonia    as a child   Seasonal allergies    Secondary hyperparathyroidism, renal (HCC)    s/p  total parathyroidectomy  2014   Thyroid  disease    Wears glasses    Wound dehiscence, surgical 11/28/2015     FAMILY HISTORY:   Family History  Problem Relation Age of Onset   Healthy Mother    Hypertension Father    Kidney failure Father    Hypertension Sister    Thyroid  disease Sister     SOCIAL HISTORY:   Social History   Tobacco Use   Smoking status: Never    Passive exposure: Never   Smokeless tobacco: Never  Substance Use Topics   Alcohol use: Not Currently    Comment: occasionally drinks a few beers     ALLERGIES:   Allergies  Allergen Reactions   Amlodipine Swelling   Cefazolin      Pt received Ancef  on 10/21.2021 without issue   Cephalosporins Anaphylaxis     Pt received Ancef  on 07/10/2020 without issue   Lisinopril Swelling and Other (See Comments)    "made my chest hurt"     CURRENT MEDICATIONS:   No current facility-administered medications for this encounter.    REVIEW OF SYSTEMS:   [X]  denotes positive finding, [ ]  denotes negative finding Cardiac  Comments:  Chest pain or chest pressure:    Shortness of breath upon exertion:    Short of breath when lying flat:    Irregular heart rhythm:        Vascular    Pain in calf, thigh, or hip brought on by ambulation:    Pain in feet at night that wakes you up from your sleep:     Blood clot in your veins:    Leg swelling:         Pulmonary    Oxygen at home:    Productive cough:     Wheezing:         Neurologic    Sudden weakness in arms or legs:     Sudden numbness in arms or legs:     Sudden onset of difficulty speaking or slurred speech:    Temporary loss of vision in one eye:     Problems with  dizziness:         Gastrointestinal    Blood in stool:     Vomited blood:         Genitourinary    Burning when urinating:     Blood in urine:        Psychiatric    Major depression:         Hematologic    Bleeding problems:    Problems with blood clotting too easily:        Skin    Rashes or ulcers:        Constitutional    Fever or chills:      PHYSICAL EXAM:   Vitals:   02/24/24 0948  BP: 110/72  Pulse: 65  Resp: 14  SpO2: 99%    GENERAL: The patient is a well-nourished female, in no acute distress. The vital signs are documented above. CARDIAC: There is a regular rate and rhythm.  VASCULAR: No palpable thrill or pulse with that right arm dialysis graft PULMONARY: Non-labored respirations  NEUROLOGIC: No focal weakness or paresthesias are detected. SKIN: There are no ulcers or rashes noted. PSYCHIATRIC: The patient has a normal affect.  STUDIES:     MEDICAL ISSUES:   ESRD: We discussed attempted thrombectomy of her graft.  If I am unable to  open the graft she understands that she would need a new catheter and plans for new access at a later date    Marti Slates, MD, FACS [facility] [phone] Pager [phone]

## 2024-02-24 NOTE — Op Note (Signed)
    Patient name: Victoria [name] MRN: [id number] DOB: 12-16-1955 Sex: female  02/24/2024 Pre-operative Diagnosis: clotted right upper arm AVGG Post-operative diagnosis:  Same Surgeon:  Gareld June Procedure Performed:  1.  Ultrasound-guided access, right upper extremity dialysis graft x 2  2.  Mechanical thrombectomy with aspiration of right arm graft  3.  Shuntogram  4.  Stent, right upper arm dialysis graft  5.  Conscious sedation, 25 minutes     Indications: This is a 68 year old female who recently underwent angioplasty of an outflow stenosis comes in today with a clotted graft.  Procedure:  The patient was identified in the holding area and taken to room 8.  The patient was then placed supine on the table and prepped and draped in the usual sterile fashion.  A time out was called.  Conscious sedation was administered with the use of IV  Versed  under continuous physician and nurse monitoring.  Heart rate, blood pressure, and oxygen saturations were continuously monitored.  Total sedation time was 25 minutes ultrasound was used to evaluate the right upper arm graft which was occluded.   A digital ultrasound image was acquired.  The graft was then accessed under ultrasound guidance using a micropuncture needle going towards the venous outflow tract..  An 018 wire was then asvanced without resistance and a micropuncture sheath was placed.  A Bentson wire was then inserted and a 7 French sheath was placed.  I then navigated a 7 French catheter into the central venous system and then performed central venography which showed a widely patent central venous system.  7000 units of heparin  and a milligram of Versed  were given.  Next, a second access in the graft going towards the arterial anastomosis was performed under ultrasound guidance with a micropuncture needle.  An 018 wire was inserted followed by placement of a micropuncture sheath.  I then inserted a Glidewire advantage into the brachial artery  which angled upwards towards the axillary artery.  A 7 French sheath was then placed.  Next, the 7 French catheter was used to perform multiple aspirations on the graft evacuating a fair amount of thrombus.  I then used a 7 x 40 Mustang balloon to macerate the thrombus on the venous outflow tract.  An over-the-wire Fogarty catheter was then advanced through the arterial sheath into the brachial artery and used to perform thrombectomy of the arterial plug.  Several sweeps of the arterial side were performed before removing the catheter.  At this point there was good flow within the graft.  I performed a shuntogram which showed that the arteriovenous anastomosis was widely patent as was the graft.  At this point, I felt that the reason the intervention occluded was because of the tortuosity and issues of the venous outflow tract.  I felt this would be best treated with a stent so an 8 x 100 Viabahn was deployed and postdilated with 8 mm balloon.  Completion imaging was performed which showed widely patent fistula with excellent thrill.  Wires were removed.  4 Monocryl was used to close the access sites.  Patient tolerated the procedure well there are no immediate complications   Impression:  #1  Successful thrombectomy with stenting of a clotted right upper arm graft  #2  Graft remains amenable to percutaneous intervention   V. Gareld June, M.D., [facility] [place] Office: [phone] Pager:  [phone]

## 2024-02-25 DIAGNOSIS — Z992 Dependence on renal dialysis: Secondary | ICD-10-CM | POA: Diagnosis not present

## 2024-02-25 DIAGNOSIS — D688 Other specified coagulation defects: Secondary | ICD-10-CM | POA: Diagnosis not present

## 2024-02-25 DIAGNOSIS — N186 End stage renal disease: Secondary | ICD-10-CM | POA: Diagnosis not present

## 2024-02-25 DIAGNOSIS — R52 Pain, unspecified: Secondary | ICD-10-CM | POA: Diagnosis not present

## 2024-02-25 DIAGNOSIS — D631 Anemia in chronic kidney disease: Secondary | ICD-10-CM | POA: Diagnosis not present

## 2024-02-25 DIAGNOSIS — N2581 Secondary hyperparathyroidism of renal origin: Secondary | ICD-10-CM | POA: Diagnosis not present

## 2024-02-27 ENCOUNTER — Encounter (HOSPITAL_COMMUNITY): Payer: Self-pay | Admitting: Surgery

## 2024-02-27 DIAGNOSIS — D688 Other specified coagulation defects: Secondary | ICD-10-CM | POA: Diagnosis not present

## 2024-02-27 DIAGNOSIS — D631 Anemia in chronic kidney disease: Secondary | ICD-10-CM | POA: Diagnosis not present

## 2024-02-27 DIAGNOSIS — R52 Pain, unspecified: Secondary | ICD-10-CM | POA: Diagnosis not present

## 2024-02-27 DIAGNOSIS — Z992 Dependence on renal dialysis: Secondary | ICD-10-CM | POA: Diagnosis not present

## 2024-02-27 DIAGNOSIS — N2581 Secondary hyperparathyroidism of renal origin: Secondary | ICD-10-CM | POA: Diagnosis not present

## 2024-02-27 DIAGNOSIS — N186 End stage renal disease: Secondary | ICD-10-CM | POA: Diagnosis not present

## 2024-02-29 DIAGNOSIS — R52 Pain, unspecified: Secondary | ICD-10-CM | POA: Diagnosis not present

## 2024-02-29 DIAGNOSIS — D688 Other specified coagulation defects: Secondary | ICD-10-CM | POA: Diagnosis not present

## 2024-02-29 DIAGNOSIS — Z992 Dependence on renal dialysis: Secondary | ICD-10-CM | POA: Diagnosis not present

## 2024-02-29 DIAGNOSIS — D631 Anemia in chronic kidney disease: Secondary | ICD-10-CM | POA: Diagnosis not present

## 2024-02-29 DIAGNOSIS — N2581 Secondary hyperparathyroidism of renal origin: Secondary | ICD-10-CM | POA: Diagnosis not present

## 2024-02-29 DIAGNOSIS — N186 End stage renal disease: Secondary | ICD-10-CM | POA: Diagnosis not present

## 2024-03-02 ENCOUNTER — Ambulatory Visit (INDEPENDENT_AMBULATORY_CARE_PROVIDER_SITE_OTHER)

## 2024-03-02 ENCOUNTER — Ambulatory Visit (HOSPITAL_COMMUNITY)

## 2024-03-02 ENCOUNTER — Encounter (HOSPITAL_COMMUNITY): Payer: Self-pay | Admitting: *Deleted

## 2024-03-02 ENCOUNTER — Ambulatory Visit (HOSPITAL_COMMUNITY)
Admission: EM | Admit: 2024-03-02 | Discharge: 2024-03-02 | Disposition: A | Discharge status: Home / Self Care | Attending: Family Medicine | Admitting: Family Medicine

## 2024-03-02 DIAGNOSIS — S92335A Nondisplaced fracture of third metatarsal bone, left foot, initial encounter for closed fracture: Secondary | ICD-10-CM | POA: Diagnosis not present

## 2024-03-02 DIAGNOSIS — R52 Pain, unspecified: Secondary | ICD-10-CM | POA: Diagnosis not present

## 2024-03-02 DIAGNOSIS — D688 Other specified coagulation defects: Secondary | ICD-10-CM | POA: Diagnosis not present

## 2024-03-02 DIAGNOSIS — M7732 Calcaneal spur, left foot: Secondary | ICD-10-CM | POA: Diagnosis not present

## 2024-03-02 DIAGNOSIS — S92355B Nondisplaced fracture of fifth metatarsal bone, left foot, initial encounter for open fracture: Secondary | ICD-10-CM

## 2024-03-02 DIAGNOSIS — D631 Anemia in chronic kidney disease: Secondary | ICD-10-CM | POA: Diagnosis not present

## 2024-03-02 DIAGNOSIS — Z992 Dependence on renal dialysis: Secondary | ICD-10-CM | POA: Diagnosis not present

## 2024-03-02 DIAGNOSIS — M19072 Primary osteoarthritis, left ankle and foot: Secondary | ICD-10-CM | POA: Diagnosis not present

## 2024-03-02 DIAGNOSIS — N2581 Secondary hyperparathyroidism of renal origin: Secondary | ICD-10-CM | POA: Diagnosis not present

## 2024-03-02 DIAGNOSIS — N186 End stage renal disease: Secondary | ICD-10-CM | POA: Diagnosis not present

## 2024-03-02 NOTE — ED Triage Notes (Signed)
 Pt states she fell 2 weeks ago and she is still having left foot pain. She is taking no meds for her pain

## 2024-03-02 NOTE — ED Notes (Signed)
 Provider notified repeat BP to be done

## 2024-03-02 NOTE — ED Notes (Signed)
 Provider notified no new orders given

## 2024-03-02 NOTE — ED Provider Notes (Signed)
 [facility]    CSN: [id number] Arrival date & time: 03/02/24  0945      History   Chief Complaint Chief Complaint  Patient presents with   Foot Injury    HPI Victoria [name] is a 68 y.o. female.   The history is provided by the patient.  Foot Injury Left foot pain x 2 weeks, states she fell at a graduation ceremony.  Since then has had pain on the lateral aspect of her foot worse with ambulation, able to bear weight using a cane.  Denies ankle pain, other injury had swelling which has resolved.  Denies other injury.  Past Medical History:  Diagnosis Date   Arthritis    Chronic bronchitis (HCC)    Complication of anesthesia ~ 2011   they gave me a medicine that swolled me and mouth burning up (08/23/2012)   Complication of anesthesia    slow to wake up   ESRD (end stage renal disease) on dialysis [facility]) Nephrologist-- dr deterding   ESRD due to HTN-; M/W/F; [address] (11/28/2015   GERD (gastroesophageal reflux disease)    no meds, diet controlled   History of acute pulmonary edema    2003   Hypertension    no longer on medications   Left patella fracture    Pneumonia    as a child   Seasonal allergies    Secondary hyperparathyroidism, renal (HCC)    s/p  total parathyroidectomy  2014   Thyroid  disease    Wears glasses    Wound dehiscence, surgical 11/28/2015    Patient Active Problem List   Diagnosis Date Noted   Class 1 obesity due to excess calories with body mass index (BMI) of 31.0 to 31.9 in adult 03/08/2023   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) 03/07/2023   Leukocytosis 03/07/2023   Complication of vascular dialysis catheter 01/20/2022   ESRD on dialysis (HCC) 01/13/2022   S/P total parathyroidectomy 01/13/2022   Aortic atherosclerosis (HCC) 02/23/2021   Bilateral carpal tunnel syndrome 02/26/2019   Numbness 02/26/2019   Pain 02/26/2019   Trigger index finger of right hand 02/26/2019   Bilateral  sensorineural hearing loss 01/16/2019   Subjective tinnitus, bilateral 01/16/2019   Deficiency of macronutrients 10/04/2013   Mechanical complication of other vascular device, implant, and graft 01/12/2013   Hyperlipidemia 10/25/2012   Thyroid  nodule 03/22/2011   Hypotension of hemodialysis 03/10/2011   Anemia in chronic kidney disease 06/28/2001   Iron  deficiency anemia 06/28/2001    Past Surgical History:  Procedure Laterality Date   A/V SHUNT INTERVENTION N/A 02/23/2024   Procedure: A/V SHUNT INTERVENTION;  Surgeon: Philipp Brawn, MD;  Location: [facility];  Service: Cardiovascular;  Laterality: N/A;   A/V SHUNT INTERVENTION N/A 02/24/2024   Procedure: A/V SHUNT INTERVENTION;  Surgeon: Margherita Shell, MD;  Location: [facility];  Service: Cardiovascular;  Laterality: N/A;   ANKLE FRACTURE SURGERY Right ~ 2005   has pins in it   APPLICATION OF WOUND VAC Left 11/28/2015   knee   APPLICATION OF WOUND VAC Left 11/28/2015   Procedure: APPLICATION OF WOUND VAC;  Surgeon: Adonica Hoose, MD;  Location: [facility];  Service: Orthopedics;  Laterality: Left;   ARTERIOVENOUS GRAFT PLACEMENT  03/25/2005   Right forearm  w/  multiple Revision's    AV FISTULA PLACEMENT Right 04/13/2022   Procedure: INSERTION OF RIGHT ARTERIOVENOUS (AV) 4-52mm x 45cm GORE-TEX GRAFT;  Surgeon: Dannis Dy, MD;  Location: [facility]  OR;  Service: Vascular;  Laterality: Right;   CARDIOVASCULAR STRESS TEST  08/24/2012   abnormal nuclear study/  inferolateral and anteroseptal areas of scar,  no ischemia/  normal LV function and wall motion, ef 77%   CATARACT EXTRACTION W/ INTRAOCULAR LENS IMPLANT Bilateral    cataract removal bilateral, lens implant in right eye   DIALYSIS FISTULA CREATION  09/20/1990   left upper arm ---  w/  Multiple Revision's until 2006   ECTOPIC PREGNANCY SURGERY  05/21/1969   FRACTURE SURGERY     HARDWARE REMOVAL Right 07/10/2020   Procedure: Removal of deep implant right fibula and tibia;  steroid injection right ankle;  Surgeon: Amada Backer, MD;  [facility];  Service: Orthopedics;  Laterality: Right;   I & D EXTREMITY Left 11/28/2015   Procedure: IRRIGATION AND DEBRIDEMENT LEFT KNEE WOUND ;  Surgeon: Adonica Hoose, MD;  [facility];  Service: Orthopedics;  Laterality: Left;   INCISION AND DRAINAGE OF WOUND Left 11/28/2015   knee   INSERTION OF DIALYSIS CATHETER Right 03/07/2023   Procedure: Cephas Collier GUIDED INSERTION OF TUNNELED DIALYSIS CATHETER;  Surgeon: Carlene Che, MD;  Location: [facility] OR;  Service: Vascular;  Laterality: Right;   IR FLUORO GUIDE CV LINE LEFT  02/09/2022   ORIF PATELLA Left 10/31/2015   Procedure: OPEN REDUCTION INTERNAL (ORIF) FIXATION LEFT PATELLA;  Surgeon: Adonica Hoose, MD;  Location[facility];  Service: Orthopedics;  Laterality: Left;   PATELLAR TENDON REPAIR  10/03/2012   Procedure: PATELLA TENDON REPAIR;  Surgeon: Glo Larch, MD;  [facility];  Service: Orthopedics;  Laterality: Right;   PATELLECTOMY  10/03/2012   Procedure: PATELLECTOMY;  Surgeon: Glo Larch, MD;  [facility];  Service: Orthopedics;  Laterality: Right;  RIGHT PARTIAL PATELLECTOMY AND PATELLA TENDON REPAIR   PERIPHERAL VASCULAR THROMBECTOMY  02/24/2024   Procedure: PERIPHERAL VASCULAR THROMBECTOMY;  Surgeon: Margherita Shell, MD;  Location: [facility];  Service: Cardiovascular;;   REVISION OF ARTERIOVENOUS GORETEX GRAFT  09/27/2012   Procedure: REVISION OF ARTERIOVENOUS GORETEX GRAFT;  Surgeon: Palma Bob, MD;  Location: [facility] OR;  Service: Vascular;  Laterality: Right;  1) Replacement of venous half of loop with 6mm Gortex graft  2) Excision of erroded pseudoaneurysm of graft with primary closure.   REVISION OF ARTERIOVENOUS GORETEX GRAFT Right 01/23/2013   Procedure: REVISION OF ARTERIOVENOUS GORETEX GRAFT;  Surgeon: Arvil Lauber, MD;  Location: [facility] OR;  Service: Vascular;  Laterality: Right;  Using piece of 6mm x 20cm Gortex graft.     REVISION OF ARTERIOVENOUS GORETEX GRAFT Right 10/07/2015   Procedure: REVISION OF Right arm ARTERIOVENOUS GORETEX GRAFT;  Surgeon: Richrd Char, MD;  Location: [facility] OR;  Service: Vascular;  Laterality: Right;   REVISION OF ARTERIOVENOUS GORETEX GRAFT Right 02/17/2016   Procedure: REVISION OF ARTERIOVENOUS GORETEX GRAFT;  Surgeon: Richrd Char, MD;  Location: [facility] OR;  Service: Vascular;  Laterality: Right;   REVISON OF ARTERIOVENOUS FISTULA Right 03/07/2023   Procedure: REVISON OF ARTERIOVENOUS FISTULA ARM;  Surgeon: Carlene Che, MD;  [facility];  Service: Vascular;  Laterality: Right;   TOOTH EXTRACTION     10/2021   TOTAL ABDOMINAL HYSTERECTOMY  03/05/2005   w/  Right Ovarian Cystectomy   TOTAL PARATHYROIDECTOMY/  THYROID  ISTHMUSECTOMY/  AUTOTRANSPLANTATION PARATHYROID  TISSUE TO LEFT BRACHIORIADIALIS MUSCLE  02/18/2011   TRANSTHORACIC ECHOCARDIOGRAM  10/31/2012   mild LVH,  grade 1 diastolic dysfunction,  ef 55-65%/  mild MR/  trivial TR   VENOUS ANGIOPLASTY  02/23/2024   Procedure: VENOUS ANGIOPLASTY;  Surgeon: Philipp Brawn, MD;  Location: [facility];  Service: Cardiovascular;;   VENOUS STENT  02/24/2024   Procedure: VENOUS STENT;  Surgeon: Margherita Shell, MD;  Location: [facility];  Service: Cardiovascular;;    OB History   No obstetric history on file.      Home Medications    Prior to Admission medications   Medication Sig Start Date End Date Taking? Authorizing Provider  benzonatate  (TESSALON ) 100 MG capsule Take 1 capsule (100 mg total) by mouth 3 (three) times daily as needed for cough. Patient not taking: Reported on 10/10/2023 09/23/23   Ann Keto, MD  calcium  acetate (PHOSLO ) 667 MG capsule Take by mouth.    [provider]  esomeprazole (NEXIUM) 20 MG capsule Take 20 mg by mouth every morning.    [provider]  methylPREDNISolone  (MEDROL  DOSEPAK) 4 MG TBPK tablet Follow instructions on package 10/10/23   Jha, Panav, MD  midodrine   (PROAMATINE ) 10 MG tablet Take 10 mg by mouth See admin instructions. Take 1 tablet by mouth three times a week as directed take one 30 minutes before starting dialysis and repeat x 1 halfway through tx. 12/08/22   [provider]    Family History Family History  Problem Relation Age of Onset   Healthy Mother    Hypertension Father    Kidney failure Father    Hypertension Sister    Thyroid  disease Sister     Social History Social History   Tobacco Use   Smoking status: Never    Passive exposure: Never   Smokeless tobacco: Never  Vaping Use   Vaping status: Never Used  Substance Use Topics   Alcohol use: Not Currently    Comment: occasionally drinks a few beers   Drug use: No     Allergies   Amlodipine, Cefazolin , Cephalosporins, and Lisinopril   Review of Systems Review of Systems  Musculoskeletal:  Positive for gait problem.  Skin:  Negative for color change and wound.     Physical Exam Triage Vital Signs ED Triage Vitals  Encounter Vitals Group     BP 03/02/24 1039 (!) 85/53     Girls Systolic BP Percentile --      Girls Diastolic BP Percentile --      Boys Systolic BP Percentile --      Boys Diastolic BP Percentile --      Pulse Rate 03/02/24 1039 88     Resp 03/02/24 1039 18     Temp 03/02/24 1039 98.1 F (36.7 C)     Temp Source 03/02/24 1039 Oral     SpO2 03/02/24 1039 95 %     Weight --      Height --      Head Circumference --      Peak Flow --      Pain Score 03/02/24 1037 9     Pain Loc --      Pain Education --      Exclude from Growth Chart --    No data found.  Updated Vital Signs BP (!) 71/46 (BP Location: Left Arm) Comment: pt requested repeat BP with peds cuff on upper arm  Pulse 88   Temp 98.1 F (36.7 C) (Oral)   Resp 18   LMP  (LMP Unknown)   SpO2 95%   Visual Acuity Right Eye Distance:   Left Eye Distance:  Bilateral Distance:    Right Eye Near:   Left Eye Near:    Bilateral Near:     Physical  Exam Vitals reviewed.  Constitutional:      Appearance: She is not ill-appearing.  Pulmonary:     Effort: Pulmonary effort is normal. No respiratory distress.   Musculoskeletal:     Comments: Left foot moderate tenderness lateral aspect midshaft fifth metatarsal no overlying swelling rest of left foot exam is normal neurovascular intact good DP pulse moves all digits left ankle full range of motion nontender no swelling or bruising no deformity   Skin:    General: Skin is warm and dry.     Capillary Refill: Capillary refill takes less than 2 seconds.     Findings: Bruising present.   Neurological:     Mental Status: She is alert and oriented to person, place, and time.   Psychiatric:        Mood and Affect: Mood normal.      UC Treatments / Results  Labs (all labs ordered are listed, but only abnormal results are displayed) Labs Reviewed - No data to display  EKG   Radiology DG Foot Complete Left Result Date: 03/02/2024 CLINICAL DATA:  Foot injury. EXAM: LEFT FOOT - COMPLETE 3+ VIEW COMPARISON:  None Available. FINDINGS: There is linear incomplete undisplaced fracture of the middle third shaft of the fifth metatarsal. No other acute fracture or dislocation. No aggressive osseous lesion. Mild diffuse degenerative changes of imaged joints. Calcaneal spur noted along the Achilles tendon and Plantar aponeurosis attachment sites. No focal soft tissue swelling. No radiopaque foreign bodies. IMPRESSION: *Linear incomplete undisplaced fracture of the middle third shaft of the fifth metatarsal. Electronically Signed   By: Beula Brunswick M.D.   On: 03/02/2024 11:15    Procedures Procedures (including critical care time)  Medications Ordered in UC Medications - No data to display  Initial Impression / Assessment and Plan / UC Course  I have reviewed the triage vital signs and the nursing notes.  Pertinent labs & imaging results that were available during my care of the patient were  reviewed by me and considered in my medical decision making (see chart for details).     68 year old female on dialysis presents with left foot pain after fall 2 weeks ago.  She has been treating it by using a cane for ambulation.  Continues to have pain and bruising no swelling.  She has point tenderness on exam.  Left foot x-ray shows nondisplaced fracture midshaft fifth metatarsal.  She was given walking boot and instructed to follow-up with her orthopedist.  She usually goes to [facility].  OTC meds for pain relief Final Clinical Impressions(s) / UC Diagnoses   Final diagnoses:  Open nondisplaced fracture of fifth metatarsal bone of left foot, initial encounter     Discharge Instructions      Over the counter medications for pain, such as acetaminophen  or ibuprofen . Follow directions on the package.    ED Prescriptions   None    PDMP not reviewed this encounter.   Breeana Sawtelle, [place] 03/02/24 1136

## 2024-03-02 NOTE — ED Notes (Signed)
 Pt states she went to dialysis this morning and BP is always low it was 132/65 when she left dialysis this morning before coming to [facility]

## 2024-03-02 NOTE — Discharge Instructions (Signed)
 Over the counter medications for pain, such as acetaminophen  or ibuprofen . Follow directions on the package.

## 2024-03-05 DIAGNOSIS — D688 Other specified coagulation defects: Secondary | ICD-10-CM | POA: Diagnosis not present

## 2024-03-05 DIAGNOSIS — D631 Anemia in chronic kidney disease: Secondary | ICD-10-CM | POA: Diagnosis not present

## 2024-03-05 DIAGNOSIS — N186 End stage renal disease: Secondary | ICD-10-CM | POA: Diagnosis not present

## 2024-03-05 DIAGNOSIS — Z992 Dependence on renal dialysis: Secondary | ICD-10-CM | POA: Diagnosis not present

## 2024-03-05 DIAGNOSIS — N2581 Secondary hyperparathyroidism of renal origin: Secondary | ICD-10-CM | POA: Diagnosis not present

## 2024-03-05 DIAGNOSIS — R52 Pain, unspecified: Secondary | ICD-10-CM | POA: Diagnosis not present

## 2024-03-07 DIAGNOSIS — D631 Anemia in chronic kidney disease: Secondary | ICD-10-CM | POA: Diagnosis not present

## 2024-03-07 DIAGNOSIS — S92352A Displaced fracture of fifth metatarsal bone, left foot, initial encounter for closed fracture: Secondary | ICD-10-CM | POA: Diagnosis not present

## 2024-03-07 DIAGNOSIS — Z992 Dependence on renal dialysis: Secondary | ICD-10-CM | POA: Diagnosis not present

## 2024-03-07 DIAGNOSIS — N2581 Secondary hyperparathyroidism of renal origin: Secondary | ICD-10-CM | POA: Diagnosis not present

## 2024-03-07 DIAGNOSIS — R52 Pain, unspecified: Secondary | ICD-10-CM | POA: Diagnosis not present

## 2024-03-07 DIAGNOSIS — D688 Other specified coagulation defects: Secondary | ICD-10-CM | POA: Diagnosis not present

## 2024-03-07 DIAGNOSIS — N186 End stage renal disease: Secondary | ICD-10-CM | POA: Diagnosis not present

## 2024-03-09 DIAGNOSIS — N2581 Secondary hyperparathyroidism of renal origin: Secondary | ICD-10-CM | POA: Diagnosis not present

## 2024-03-09 DIAGNOSIS — Z992 Dependence on renal dialysis: Secondary | ICD-10-CM | POA: Diagnosis not present

## 2024-03-09 DIAGNOSIS — D631 Anemia in chronic kidney disease: Secondary | ICD-10-CM | POA: Diagnosis not present

## 2024-03-09 DIAGNOSIS — D688 Other specified coagulation defects: Secondary | ICD-10-CM | POA: Diagnosis not present

## 2024-03-09 DIAGNOSIS — N186 End stage renal disease: Secondary | ICD-10-CM | POA: Diagnosis not present

## 2024-03-09 DIAGNOSIS — R52 Pain, unspecified: Secondary | ICD-10-CM | POA: Diagnosis not present

## 2024-03-12 DIAGNOSIS — N2581 Secondary hyperparathyroidism of renal origin: Secondary | ICD-10-CM | POA: Diagnosis not present

## 2024-03-12 DIAGNOSIS — N186 End stage renal disease: Secondary | ICD-10-CM | POA: Diagnosis not present

## 2024-03-12 DIAGNOSIS — D688 Other specified coagulation defects: Secondary | ICD-10-CM | POA: Diagnosis not present

## 2024-03-12 DIAGNOSIS — D631 Anemia in chronic kidney disease: Secondary | ICD-10-CM | POA: Diagnosis not present

## 2024-03-12 DIAGNOSIS — Z992 Dependence on renal dialysis: Secondary | ICD-10-CM | POA: Diagnosis not present

## 2024-03-12 DIAGNOSIS — R52 Pain, unspecified: Secondary | ICD-10-CM | POA: Diagnosis not present

## 2024-03-14 DIAGNOSIS — N186 End stage renal disease: Secondary | ICD-10-CM | POA: Diagnosis not present

## 2024-03-14 DIAGNOSIS — N2581 Secondary hyperparathyroidism of renal origin: Secondary | ICD-10-CM | POA: Diagnosis not present

## 2024-03-14 DIAGNOSIS — R52 Pain, unspecified: Secondary | ICD-10-CM | POA: Diagnosis not present

## 2024-03-14 DIAGNOSIS — Z992 Dependence on renal dialysis: Secondary | ICD-10-CM | POA: Diagnosis not present

## 2024-03-14 DIAGNOSIS — D631 Anemia in chronic kidney disease: Secondary | ICD-10-CM | POA: Diagnosis not present

## 2024-03-14 DIAGNOSIS — D688 Other specified coagulation defects: Secondary | ICD-10-CM | POA: Diagnosis not present

## 2024-03-16 DIAGNOSIS — D688 Other specified coagulation defects: Secondary | ICD-10-CM | POA: Diagnosis not present

## 2024-03-16 DIAGNOSIS — Z992 Dependence on renal dialysis: Secondary | ICD-10-CM | POA: Diagnosis not present

## 2024-03-16 DIAGNOSIS — N2581 Secondary hyperparathyroidism of renal origin: Secondary | ICD-10-CM | POA: Diagnosis not present

## 2024-03-16 DIAGNOSIS — N186 End stage renal disease: Secondary | ICD-10-CM | POA: Diagnosis not present

## 2024-03-16 DIAGNOSIS — D631 Anemia in chronic kidney disease: Secondary | ICD-10-CM | POA: Diagnosis not present

## 2024-03-16 DIAGNOSIS — R52 Pain, unspecified: Secondary | ICD-10-CM | POA: Diagnosis not present

## 2024-03-19 DIAGNOSIS — D688 Other specified coagulation defects: Secondary | ICD-10-CM | POA: Diagnosis not present

## 2024-03-19 DIAGNOSIS — R52 Pain, unspecified: Secondary | ICD-10-CM | POA: Diagnosis not present

## 2024-03-19 DIAGNOSIS — I120 Hypertensive chronic kidney disease with stage 5 chronic kidney disease or end stage renal disease: Secondary | ICD-10-CM | POA: Diagnosis not present

## 2024-03-19 DIAGNOSIS — D631 Anemia in chronic kidney disease: Secondary | ICD-10-CM | POA: Diagnosis not present

## 2024-03-19 DIAGNOSIS — N186 End stage renal disease: Secondary | ICD-10-CM | POA: Diagnosis not present

## 2024-03-19 DIAGNOSIS — N2581 Secondary hyperparathyroidism of renal origin: Secondary | ICD-10-CM | POA: Diagnosis not present

## 2024-03-19 DIAGNOSIS — Z992 Dependence on renal dialysis: Secondary | ICD-10-CM | POA: Diagnosis not present

## 2024-03-21 DIAGNOSIS — D631 Anemia in chronic kidney disease: Secondary | ICD-10-CM | POA: Diagnosis not present

## 2024-03-21 DIAGNOSIS — Z992 Dependence on renal dialysis: Secondary | ICD-10-CM | POA: Diagnosis not present

## 2024-03-21 DIAGNOSIS — D688 Other specified coagulation defects: Secondary | ICD-10-CM | POA: Diagnosis not present

## 2024-03-21 DIAGNOSIS — N2581 Secondary hyperparathyroidism of renal origin: Secondary | ICD-10-CM | POA: Diagnosis not present

## 2024-03-23 DIAGNOSIS — D688 Other specified coagulation defects: Secondary | ICD-10-CM | POA: Diagnosis not present

## 2024-03-23 DIAGNOSIS — Z992 Dependence on renal dialysis: Secondary | ICD-10-CM | POA: Diagnosis not present

## 2024-03-23 DIAGNOSIS — D631 Anemia in chronic kidney disease: Secondary | ICD-10-CM | POA: Diagnosis not present

## 2024-03-23 DIAGNOSIS — N2581 Secondary hyperparathyroidism of renal origin: Secondary | ICD-10-CM | POA: Diagnosis not present

## 2024-03-23 DIAGNOSIS — N186 End stage renal disease: Secondary | ICD-10-CM | POA: Diagnosis not present

## 2024-03-26 DIAGNOSIS — N2581 Secondary hyperparathyroidism of renal origin: Secondary | ICD-10-CM | POA: Diagnosis not present

## 2024-03-26 DIAGNOSIS — D688 Other specified coagulation defects: Secondary | ICD-10-CM | POA: Diagnosis not present

## 2024-03-26 DIAGNOSIS — Z992 Dependence on renal dialysis: Secondary | ICD-10-CM | POA: Diagnosis not present

## 2024-03-26 DIAGNOSIS — D631 Anemia in chronic kidney disease: Secondary | ICD-10-CM | POA: Diagnosis not present

## 2024-03-28 DIAGNOSIS — S92352D Displaced fracture of fifth metatarsal bone, left foot, subsequent encounter for fracture with routine healing: Secondary | ICD-10-CM | POA: Diagnosis not present

## 2024-03-28 DIAGNOSIS — Z992 Dependence on renal dialysis: Secondary | ICD-10-CM | POA: Diagnosis not present

## 2024-04-04 DIAGNOSIS — Z992 Dependence on renal dialysis: Secondary | ICD-10-CM | POA: Diagnosis not present

## 2024-04-09 DIAGNOSIS — N2581 Secondary hyperparathyroidism of renal origin: Secondary | ICD-10-CM | POA: Diagnosis not present

## 2024-04-09 DIAGNOSIS — N186 End stage renal disease: Secondary | ICD-10-CM | POA: Diagnosis not present

## 2024-04-09 DIAGNOSIS — D631 Anemia in chronic kidney disease: Secondary | ICD-10-CM | POA: Diagnosis not present

## 2024-04-09 DIAGNOSIS — D688 Other specified coagulation defects: Secondary | ICD-10-CM | POA: Diagnosis not present

## 2024-04-09 DIAGNOSIS — Z992 Dependence on renal dialysis: Secondary | ICD-10-CM | POA: Diagnosis not present

## 2024-04-13 DIAGNOSIS — N186 End stage renal disease: Secondary | ICD-10-CM | POA: Diagnosis not present

## 2024-04-19 DIAGNOSIS — I120 Hypertensive chronic kidney disease with stage 5 chronic kidney disease or end stage renal disease: Secondary | ICD-10-CM | POA: Diagnosis not present

## 2024-04-19 DIAGNOSIS — Z992 Dependence on renal dialysis: Secondary | ICD-10-CM | POA: Diagnosis not present

## 2024-04-19 DIAGNOSIS — N186 End stage renal disease: Secondary | ICD-10-CM | POA: Diagnosis not present

## 2024-04-20 DIAGNOSIS — D688 Other specified coagulation defects: Secondary | ICD-10-CM | POA: Diagnosis not present

## 2024-04-20 DIAGNOSIS — N186 End stage renal disease: Secondary | ICD-10-CM | POA: Diagnosis not present

## 2024-04-20 DIAGNOSIS — D631 Anemia in chronic kidney disease: Secondary | ICD-10-CM | POA: Diagnosis not present

## 2024-04-20 DIAGNOSIS — N2581 Secondary hyperparathyroidism of renal origin: Secondary | ICD-10-CM | POA: Diagnosis not present

## 2024-04-20 DIAGNOSIS — Z992 Dependence on renal dialysis: Secondary | ICD-10-CM | POA: Diagnosis not present

## 2024-04-25 DIAGNOSIS — S92352D Displaced fracture of fifth metatarsal bone, left foot, subsequent encounter for fracture with routine healing: Secondary | ICD-10-CM | POA: Diagnosis not present

## 2024-05-02 DIAGNOSIS — D688 Other specified coagulation defects: Secondary | ICD-10-CM | POA: Diagnosis not present

## 2024-05-11 DIAGNOSIS — D631 Anemia in chronic kidney disease: Secondary | ICD-10-CM | POA: Diagnosis not present

## 2024-05-11 DIAGNOSIS — D688 Other specified coagulation defects: Secondary | ICD-10-CM | POA: Diagnosis not present

## 2024-05-11 DIAGNOSIS — N186 End stage renal disease: Secondary | ICD-10-CM | POA: Diagnosis not present

## 2024-05-11 DIAGNOSIS — N2581 Secondary hyperparathyroidism of renal origin: Secondary | ICD-10-CM | POA: Diagnosis not present

## 2024-05-11 DIAGNOSIS — Z992 Dependence on renal dialysis: Secondary | ICD-10-CM | POA: Diagnosis not present

## 2024-05-14 DIAGNOSIS — D631 Anemia in chronic kidney disease: Secondary | ICD-10-CM | POA: Diagnosis not present

## 2024-05-16 ENCOUNTER — Ambulatory Visit (HOSPITAL_COMMUNITY)
Admission: RE | Admit: 2024-05-16 | Discharge: 2024-05-16 | Disposition: A | Discharge status: Home / Self Care | Source: Ambulatory Visit | Attending: Vascular Surgery | Admitting: Vascular Surgery

## 2024-05-16 ENCOUNTER — Encounter (HOSPITAL_COMMUNITY)
Admission: RE | Disposition: A | Discharge status: Home / Self Care | Payer: Self-pay | Source: Ambulatory Visit | Attending: Vascular Surgery

## 2024-05-16 ENCOUNTER — Other Ambulatory Visit: Payer: Self-pay

## 2024-05-16 DIAGNOSIS — D688 Other specified coagulation defects: Secondary | ICD-10-CM | POA: Diagnosis not present

## 2024-05-16 DIAGNOSIS — N2581 Secondary hyperparathyroidism of renal origin: Secondary | ICD-10-CM | POA: Diagnosis not present

## 2024-05-16 DIAGNOSIS — N186 End stage renal disease: Secondary | ICD-10-CM | POA: Insufficient documentation

## 2024-05-16 DIAGNOSIS — D631 Anemia in chronic kidney disease: Secondary | ICD-10-CM | POA: Diagnosis not present

## 2024-05-16 DIAGNOSIS — Z992 Dependence on renal dialysis: Secondary | ICD-10-CM | POA: Diagnosis not present

## 2024-05-16 DIAGNOSIS — T82868A Thrombosis of vascular prosthetic devices, implants and grafts, initial encounter: Secondary | ICD-10-CM | POA: Diagnosis not present

## 2024-05-16 DIAGNOSIS — Y832 Surgical operation with anastomosis, bypass or graft as the cause of abnormal reaction of the patient, or of later complication, without mention of misadventure at the time of the procedure: Secondary | ICD-10-CM | POA: Insufficient documentation

## 2024-05-16 DIAGNOSIS — T82858A Stenosis of vascular prosthetic devices, implants and grafts, initial encounter: Secondary | ICD-10-CM

## 2024-05-16 DIAGNOSIS — Z95820 Peripheral vascular angioplasty status with implants and grafts: Secondary | ICD-10-CM | POA: Insufficient documentation

## 2024-05-16 DIAGNOSIS — I120 Hypertensive chronic kidney disease with stage 5 chronic kidney disease or end stage renal disease: Secondary | ICD-10-CM | POA: Insufficient documentation

## 2024-05-16 HISTORY — PX: PERIPHERAL VASCULAR THROMBECTOMY: CATH118306

## 2024-05-16 SURGERY — PERIPHERAL VASCULAR THROMBECTOMY
Anesthesia: LOCAL | Site: Arm Upper | Laterality: Right

## 2024-05-16 MED ORDER — LIDOCAINE HCL (PF) 1 % IJ SOLN
INTRAMUSCULAR | Status: AC
Start: 2024-05-16 — End: 2024-05-16
  Filled 2024-05-16: qty 30

## 2024-05-16 MED ORDER — LIDOCAINE HCL (PF) 1 % IJ SOLN
INTRAMUSCULAR | Status: DC | PRN
Start: 2024-05-16 — End: 2024-05-16
  Administered 2024-05-16: 5 mL via INTRADERMAL

## 2024-05-16 MED ORDER — IODIXANOL 320 MG/ML IV SOLN
INTRAVENOUS | Status: DC | PRN
  Administered 2024-05-16: 20 mL

## 2024-05-16 MED ORDER — HEPARIN SODIUM (PORCINE) 1000 UNIT/ML IJ SOLN
INTRAMUSCULAR | Status: AC
  Filled 2024-05-16: qty 10

## 2024-05-16 MED ORDER — HEPARIN (PORCINE) IN NACL 1000-0.9 UT/500ML-% IV SOLN
INTRAVENOUS | Status: DC | PRN
  Administered 2024-05-16: 500 mL

## 2024-05-16 MED ORDER — APIXABAN 5 MG PO TABS: 5.0000 mg | ORAL_TABLET | Freq: Two times a day (BID) | ORAL | 3 refills | Status: DC

## 2024-05-16 SURGICAL SUPPLY — 16 items
BALLOON MUSTANG 5.0X40 75 (BALLOONS) IMPLANT
BALLOON MUSTANG 7X100X75 (BALLOONS) IMPLANT
BALLOON MUSTANG 8.0X40 75 (BALLOONS) IMPLANT
CATH MACH 1 ST 7FR 55 (CATHETERS) IMPLANT
GUIDEWIRE ANGLED .035 180CM (WIRE) IMPLANT
GUIDEWIRE ANGLED .035X150CM (WIRE) IMPLANT
KIT ENCORE 26 ADVANTAGE (KITS) IMPLANT
KIT MICROPUNCTURE NIT STIFF (SHEATH) IMPLANT
SHEATH PINNACLE 8F 10CM (SHEATH) IMPLANT
SHEATH PINNACLE R/O II 6F 4CM (SHEATH) IMPLANT
SHEATH PINNACLE R/O II 7F 4CM (SHEATH) IMPLANT
STENT VIABAHN 10X5X120 (Permanent Stent) IMPLANT
TRAY PV CATH (CUSTOM PROCEDURE TRAY) ×2 IMPLANT
TUBING CIL FLEX 10 FLL-RA (TUBING) IMPLANT
WIRE BENTSON .035X145CM (WIRE) IMPLANT
WIRE TORQFLEX AUST .018X40CM (WIRE) IMPLANT

## 2024-05-16 NOTE — H&P (Signed)
 HD ACCESS CENTER H&P   Patient ID: MIYOKO HASHIMI, female   DOB: 1956-09-06, 68 y.o.   MRN: [id number]  Subjective:     HPI ZYKERA ABELLA is a 68 y.o. female with ESRD presenting to the HD access center for intervention.  Past Medical History:  Diagnosis Date   Arthritis    Chronic bronchitis (HCC)    Complication of anesthesia ~ 2011   they gave me a medicine that swolled me and mouth burning up (08/23/2012)   Complication of anesthesia    slow to wake up   ESRD (end stage renal disease) on dialysis [facility]) Nephrologist-- dr deterding   ESRD due to HTN-; M/W/F; [address] (11/28/2015   GERD (gastroesophageal reflux disease)    no meds, diet controlled   History of acute pulmonary edema    2003   Hypertension    no longer on medications   Left patella fracture    Pneumonia    as a child   Seasonal allergies    Secondary hyperparathyroidism, renal (HCC)    s/p  total parathyroidectomy  2014   Thyroid  disease    Wears glasses    Wound dehiscence, surgical 11/28/2015   Family History  Problem Relation Age of Onset   Healthy Mother    Hypertension Father    Kidney failure Father    Hypertension Sister    Thyroid  disease Sister    Past Surgical History:  Procedure Laterality Date   A/V SHUNT INTERVENTION N/A 02/23/2024   Procedure: A/V SHUNT INTERVENTION;  Surgeon: Pearline Norman RAMAN, MD;  Location: [facility];  Service: Cardiovascular;  Laterality: N/A;   A/V SHUNT INTERVENTION N/A 02/24/2024   Procedure: A/V SHUNT INTERVENTION;  Surgeon: Serene Gaile ORN, MD;  Location: [facility];  Service: Cardiovascular;  Laterality: N/A;   ANKLE FRACTURE SURGERY Right ~ 2005   has pins in it   APPLICATION OF WOUND VAC Left 11/28/2015   knee   APPLICATION OF WOUND VAC Left 11/28/2015   Procedure: APPLICATION OF WOUND VAC;  Surgeon: Redell Shoals, MD;  Location: [facility];  Service: Orthopedics;  Laterality: Left;   ARTERIOVENOUS GRAFT PLACEMENT  03/25/2005   Right forearm  w/   multiple Revision's    AV FISTULA PLACEMENT Right 04/13/2022   Procedure: INSERTION OF RIGHT ARTERIOVENOUS (AV) 4-55mm x 45cm GORE-TEX GRAFT;  Surgeon: Eliza Lonni RAMAN, MD;  Location: [facility] OR;  Service: Vascular;  Laterality: Right;   CARDIOVASCULAR STRESS TEST  08/24/2012   abnormal nuclear study/  inferolateral and anteroseptal areas of scar,  no ischemia/  normal LV function and wall motion, ef 77%   CATARACT EXTRACTION W/ INTRAOCULAR LENS IMPLANT Bilateral    cataract removal bilateral, lens implant in right eye   DIALYSIS FISTULA CREATION  09/20/1990   left upper arm ---  w/  Multiple Revision's until 2006   ECTOPIC PREGNANCY SURGERY  05/21/1969   FRACTURE SURGERY     HARDWARE REMOVAL Right 07/10/2020   Procedure: Removal of deep implant right fibula and tibia; steroid injection right ankle;  Surgeon: Kit Rush, MD;  Location: [facility];  Service: Orthopedics;  Laterality: Right;   I & D EXTREMITY Left 11/28/2015   Procedure: IRRIGATION AND DEBRIDEMENT LEFT KNEE WOUND ;  Surgeon: Redell Shoals, MD;  Location: [facility];  Service: Orthopedics;  Laterality: Left;   INCISION AND DRAINAGE OF WOUND Left 11/28/2015   knee   INSERTION OF DIALYSIS CATHETER Right 03/07/2023   Procedure: JERYL GUIDED INSERTION  OF TUNNELED DIALYSIS CATHETER;  Surgeon: Magda Debby SAILOR, MD;  Location: [facility] OR;  Service: Vascular;  Laterality: Right;   IR FLUORO GUIDE CV LINE LEFT  02/09/2022   ORIF PATELLA Left 10/31/2015   Procedure: OPEN REDUCTION INTERNAL (ORIF) FIXATION LEFT PATELLA;  Surgeon: Redell Shoals, MD;  Location: [facility];  Service: Orthopedics;  Laterality: Left;   PATELLAR TENDON REPAIR  10/03/2012   Procedure: PATELLA TENDON REPAIR;  Surgeon: Franky CHRISTELLA Pointer, MD;  Location: [facility];  Service: Orthopedics;  Laterality: Right;   PATELLECTOMY  10/03/2012   Procedure: PATELLECTOMY;  Surgeon: Franky CHRISTELLA Pointer, MD;  Location: [facility];  Service: Orthopedics;  Laterality: Right;  RIGHT  PARTIAL PATELLECTOMY AND PATELLA TENDON REPAIR   PERIPHERAL VASCULAR THROMBECTOMY  02/24/2024   Procedure: PERIPHERAL VASCULAR THROMBECTOMY;  Surgeon: Serene Gaile ORN, MD;  Location: [facility];  Service: Cardiovascular;;   REVISION OF ARTERIOVENOUS GORETEX GRAFT  09/27/2012   Procedure: REVISION OF ARTERIOVENOUS GORETEX GRAFT;  Surgeon: Lynwood JONETTA Collum, MD;  Location: [facility] OR;  Service: Vascular;  Laterality: Right;  1) Replacement of venous half of loop with 6mm Gortex graft  2) Excision of erroded pseudoaneurysm of graft with primary closure.   REVISION OF ARTERIOVENOUS GORETEX GRAFT Right 01/23/2013   Procedure: REVISION OF ARTERIOVENOUS GORETEX GRAFT;  Surgeon: Redell LITTIE Door, MD;  Location: [facility] OR;  Service: Vascular;  Laterality: Right;  Using piece of 6mm x 20cm Gortex graft.    REVISION OF ARTERIOVENOUS GORETEX GRAFT Right 10/07/2015   Procedure: REVISION OF Right arm ARTERIOVENOUS GORETEX GRAFT;  Surgeon: Carlin FORBES Haddock, MD;  Location: [facility] OR;  Service: Vascular;  Laterality: Right;   REVISION OF ARTERIOVENOUS GORETEX GRAFT Right 02/17/2016   Procedure: REVISION OF ARTERIOVENOUS GORETEX GRAFT;  Surgeon: Carlin FORBES Haddock, MD;  Location: [facility] OR;  Service: Vascular;  Laterality: Right;   REVISON OF ARTERIOVENOUS FISTULA Right 03/07/2023   Procedure: REVISON OF ARTERIOVENOUS FISTULA ARM;  Surgeon: Magda Debby SAILOR, MD;  Location: [facility];  Service: Vascular;  Laterality: Right;   TOOTH EXTRACTION     10/2021   TOTAL ABDOMINAL HYSTERECTOMY  03/05/2005   w/  Right Ovarian Cystectomy   TOTAL PARATHYROIDECTOMY/  THYROID  ISTHMUSECTOMY/  AUTOTRANSPLANTATION PARATHYROID  TISSUE TO LEFT BRACHIORIADIALIS MUSCLE  02/18/2011   TRANSTHORACIC ECHOCARDIOGRAM  10/31/2012   mild LVH,  grade 1 diastolic dysfunction,  ef 55-65%/  mild MR/  trivial TR   VENOUS ANGIOPLASTY  02/23/2024   Procedure: VENOUS ANGIOPLASTY;  Surgeon: Pearline Norman RAMAN, MD;  Location: [facility];  Service: Cardiovascular;;   VENOUS STENT  02/24/2024    Procedure: VENOUS STENT;  Surgeon: Serene Gaile ORN, MD;  Location: [facility];  Service: Cardiovascular;;    Short Social History:  Social History   Tobacco Use   Smoking status: Never    Passive exposure: Never   Smokeless tobacco: Never  Substance Use Topics   Alcohol use: Not Currently    Comment: occasionally drinks a few beers    Allergies  Allergen Reactions   Amlodipine Swelling   Cefazolin      Pt received Ancef  on 10/21.2021 without issue   Cephalosporins Anaphylaxis    Pt received Ancef  on 07/10/2020 without issue   Lisinopril Swelling and Other (See Comments)    made my chest hurt    No current facility-administered medications for this encounter.    REVIEW OF SYSTEMS All other systems were reviewed and are negative     Objective:  Objective   Vitals:   05/16/24 0758 05/16/24 0804  BP: 124/76 124/76  Pulse: 76 69  Resp: 12 16  Temp: 97.7 F (36.5 C)   TempSrc: Oral   SpO2: 96% 97%   There is no height or weight on file to calculate BMI.  Physical Exam General: no acute distress Cardiac: hemodynamically stable Extremities: No thrill or pulse in right arm AVG  Data: Reviewed fistulogram from June, and a venous outflow stent was placed by Dr. Serene.     Assessment/Plan:   ERIYANA SWEETEN is a 68 y.o. female with ESRD presenting for AVG thrombectomy.  Having issues with clotted access. Last HD session Monday. Reviewed risks and benefits of thrombectomy, possible TDC placement and patient agreed to proceed.   Norman Serve, MD [facility]

## 2024-05-16 NOTE — Op Note (Signed)
    Patient name: Victoria [name] MRN: [id number] DOB: 10-25-1955 Sex: female  05/16/2024 Pre-operative Diagnosis: ESRD on HD Post-operative diagnosis:  Same Surgeon:  Victoria GORMAN Serve, MD Procedure Performed:  Ultrasound-guided access of right arm AVG in an antegrade fashion Mechanical thrombectomy of right arm AVG, aspiration and balloon maceration Balloon angioplasty of mid AV graft stenosis, 8 mm Mustang Stent placement of venous outflow, 10 mm x 5 cm Viabahn  Indications: Victoria [name] is a 68 year old female with ESRD on HD who presented to the HD access center for AVG thrombectomy.  She had been having issues with a clotted access.  Her last HD session was on Monday.  She had a previous fistulogram about 2 to 2-1/2 months ago and a venous outflow stent, 8 mm x 10 cm Viabahn was placed.  Risks and benefits of thrombectomy with possible tunneled dialysis catheter placement were reviewed and she elected to proceed.  Findings:  Clotted AVG from the mid graft and the previous placed outflow stent.  The proximal arterial portion of the graft is patent.  Central venous system is widely patent.   Procedure:  The patient was identified in the holding area and taken to the cath lab  The patient was then placed supine on the table and prepped and draped in the usual sterile fashion.  A time out was called.  Ultrasound was used to evaluate the right arm AV access. This was accessed under u/s guidance and an antegrade fashion. An 018 wire was advanced without resistance, a micropuncture sheath was placed and a Bentson wire was placed through the thrombosed graft and into the central system.  The access was then upsized to a 7 [place] sheath and a 7 [place] guide cath was placed over this wire and into the central system.  A central venogram was then obtained which demonstrated patency of the central veins. I then performed 2 passes of a 7 French glide cath under aspiration to aspirate any acute thrombus. The wire  was then replaced into the central system and the entire graft was ballooned with a 7 mm Mustang balloon. A fistulogram was then obtained which demonstrated flow through the graft and the previously placed stent with a stenosis at both the proximal and distal stent edges.  A gentle injection with the balloon up was also performed which demonstrated patency of the arterial aspect of the graft.  Given the residual thrombus and stenosis at the central stent edge I elected to place a 10 mm x 5 mm Viabahn stent.  This was postdilated with an 8 mm Mustang.  This 8 mm Mustang was then used to balloon angioplasty the proximal stent edge.  Completion fistulogram demonstrated brisk flow with patency of the treated segments.  The wire and sheath were removed and the access was managed with a 4-0 Monocryl figure-of-eight suture for hemostasis.  Contrast: 20 cc  Sedation: None  Impression: Successful thrombectomy of the right arm AVG with stent placement into the venous outflow and balloon angioplasty of the proximal stent edge of the previous placed stent.   Victoria GORMAN Serve MD [facility] [place] Office: [phone]

## 2024-05-17 ENCOUNTER — Encounter (HOSPITAL_COMMUNITY): Payer: Self-pay | Admitting: Vascular Surgery

## 2024-05-18 DIAGNOSIS — D631 Anemia in chronic kidney disease: Secondary | ICD-10-CM | POA: Diagnosis not present

## 2024-05-18 DIAGNOSIS — D688 Other specified coagulation defects: Secondary | ICD-10-CM | POA: Diagnosis not present

## 2024-05-18 DIAGNOSIS — N2581 Secondary hyperparathyroidism of renal origin: Secondary | ICD-10-CM | POA: Diagnosis not present

## 2024-05-18 DIAGNOSIS — Z992 Dependence on renal dialysis: Secondary | ICD-10-CM | POA: Diagnosis not present

## 2024-05-18 DIAGNOSIS — N186 End stage renal disease: Secondary | ICD-10-CM | POA: Diagnosis not present

## 2024-05-20 DIAGNOSIS — N186 End stage renal disease: Secondary | ICD-10-CM | POA: Diagnosis not present

## 2024-05-20 DIAGNOSIS — I120 Hypertensive chronic kidney disease with stage 5 chronic kidney disease or end stage renal disease: Secondary | ICD-10-CM | POA: Diagnosis not present

## 2024-05-20 DIAGNOSIS — Z992 Dependence on renal dialysis: Secondary | ICD-10-CM | POA: Diagnosis not present

## 2024-05-21 DIAGNOSIS — N2581 Secondary hyperparathyroidism of renal origin: Secondary | ICD-10-CM | POA: Diagnosis not present

## 2024-05-21 DIAGNOSIS — N186 End stage renal disease: Secondary | ICD-10-CM | POA: Diagnosis not present

## 2024-05-21 DIAGNOSIS — D631 Anemia in chronic kidney disease: Secondary | ICD-10-CM | POA: Diagnosis not present

## 2024-05-21 DIAGNOSIS — D509 Iron deficiency anemia, unspecified: Secondary | ICD-10-CM | POA: Diagnosis not present

## 2024-05-21 DIAGNOSIS — Z992 Dependence on renal dialysis: Secondary | ICD-10-CM | POA: Diagnosis not present

## 2024-05-21 DIAGNOSIS — D688 Other specified coagulation defects: Secondary | ICD-10-CM | POA: Diagnosis not present

## 2024-05-23 DIAGNOSIS — N2581 Secondary hyperparathyroidism of renal origin: Secondary | ICD-10-CM | POA: Diagnosis not present

## 2024-05-23 DIAGNOSIS — N186 End stage renal disease: Secondary | ICD-10-CM | POA: Diagnosis not present

## 2024-05-23 DIAGNOSIS — D509 Iron deficiency anemia, unspecified: Secondary | ICD-10-CM | POA: Diagnosis not present

## 2024-05-23 DIAGNOSIS — D631 Anemia in chronic kidney disease: Secondary | ICD-10-CM | POA: Diagnosis not present

## 2024-05-23 DIAGNOSIS — Z992 Dependence on renal dialysis: Secondary | ICD-10-CM | POA: Diagnosis not present

## 2024-05-23 DIAGNOSIS — D688 Other specified coagulation defects: Secondary | ICD-10-CM | POA: Diagnosis not present

## 2024-05-25 DIAGNOSIS — N2581 Secondary hyperparathyroidism of renal origin: Secondary | ICD-10-CM | POA: Diagnosis not present

## 2024-05-25 DIAGNOSIS — D631 Anemia in chronic kidney disease: Secondary | ICD-10-CM | POA: Diagnosis not present

## 2024-05-25 DIAGNOSIS — Z992 Dependence on renal dialysis: Secondary | ICD-10-CM | POA: Diagnosis not present

## 2024-05-25 DIAGNOSIS — D509 Iron deficiency anemia, unspecified: Secondary | ICD-10-CM | POA: Diagnosis not present

## 2024-05-25 DIAGNOSIS — D688 Other specified coagulation defects: Secondary | ICD-10-CM | POA: Diagnosis not present

## 2024-05-25 DIAGNOSIS — N186 End stage renal disease: Secondary | ICD-10-CM | POA: Diagnosis not present

## 2024-05-28 DIAGNOSIS — D509 Iron deficiency anemia, unspecified: Secondary | ICD-10-CM | POA: Diagnosis not present

## 2024-05-28 DIAGNOSIS — D631 Anemia in chronic kidney disease: Secondary | ICD-10-CM | POA: Diagnosis not present

## 2024-05-28 DIAGNOSIS — D688 Other specified coagulation defects: Secondary | ICD-10-CM | POA: Diagnosis not present

## 2024-05-28 DIAGNOSIS — Z992 Dependence on renal dialysis: Secondary | ICD-10-CM | POA: Diagnosis not present

## 2024-05-28 DIAGNOSIS — N186 End stage renal disease: Secondary | ICD-10-CM | POA: Diagnosis not present

## 2024-05-28 DIAGNOSIS — N2581 Secondary hyperparathyroidism of renal origin: Secondary | ICD-10-CM | POA: Diagnosis not present

## 2024-05-30 DIAGNOSIS — Z992 Dependence on renal dialysis: Secondary | ICD-10-CM | POA: Diagnosis not present

## 2024-05-30 DIAGNOSIS — D688 Other specified coagulation defects: Secondary | ICD-10-CM | POA: Diagnosis not present

## 2024-05-30 DIAGNOSIS — D631 Anemia in chronic kidney disease: Secondary | ICD-10-CM | POA: Diagnosis not present

## 2024-05-30 DIAGNOSIS — D509 Iron deficiency anemia, unspecified: Secondary | ICD-10-CM | POA: Diagnosis not present

## 2024-05-30 DIAGNOSIS — N186 End stage renal disease: Secondary | ICD-10-CM | POA: Diagnosis not present

## 2024-05-30 DIAGNOSIS — N2581 Secondary hyperparathyroidism of renal origin: Secondary | ICD-10-CM | POA: Diagnosis not present

## 2024-06-01 DIAGNOSIS — D631 Anemia in chronic kidney disease: Secondary | ICD-10-CM | POA: Diagnosis not present

## 2024-06-01 DIAGNOSIS — Z992 Dependence on renal dialysis: Secondary | ICD-10-CM | POA: Diagnosis not present

## 2024-06-01 DIAGNOSIS — N186 End stage renal disease: Secondary | ICD-10-CM | POA: Diagnosis not present

## 2024-06-01 DIAGNOSIS — D509 Iron deficiency anemia, unspecified: Secondary | ICD-10-CM | POA: Diagnosis not present

## 2024-06-01 DIAGNOSIS — N2581 Secondary hyperparathyroidism of renal origin: Secondary | ICD-10-CM | POA: Diagnosis not present

## 2024-06-01 DIAGNOSIS — D688 Other specified coagulation defects: Secondary | ICD-10-CM | POA: Diagnosis not present

## 2024-06-04 DIAGNOSIS — D509 Iron deficiency anemia, unspecified: Secondary | ICD-10-CM | POA: Diagnosis not present

## 2024-06-04 DIAGNOSIS — N186 End stage renal disease: Secondary | ICD-10-CM | POA: Diagnosis not present

## 2024-06-04 DIAGNOSIS — N2581 Secondary hyperparathyroidism of renal origin: Secondary | ICD-10-CM | POA: Diagnosis not present

## 2024-06-04 DIAGNOSIS — D688 Other specified coagulation defects: Secondary | ICD-10-CM | POA: Diagnosis not present

## 2024-06-04 DIAGNOSIS — Z992 Dependence on renal dialysis: Secondary | ICD-10-CM | POA: Diagnosis not present

## 2024-06-04 DIAGNOSIS — D631 Anemia in chronic kidney disease: Secondary | ICD-10-CM | POA: Diagnosis not present

## 2024-06-06 DIAGNOSIS — Z992 Dependence on renal dialysis: Secondary | ICD-10-CM | POA: Diagnosis not present

## 2024-06-06 DIAGNOSIS — D509 Iron deficiency anemia, unspecified: Secondary | ICD-10-CM | POA: Diagnosis not present

## 2024-06-06 DIAGNOSIS — S92352D Displaced fracture of fifth metatarsal bone, left foot, subsequent encounter for fracture with routine healing: Secondary | ICD-10-CM | POA: Diagnosis not present

## 2024-06-06 DIAGNOSIS — N2581 Secondary hyperparathyroidism of renal origin: Secondary | ICD-10-CM | POA: Diagnosis not present

## 2024-06-06 DIAGNOSIS — D688 Other specified coagulation defects: Secondary | ICD-10-CM | POA: Diagnosis not present

## 2024-06-06 DIAGNOSIS — N186 End stage renal disease: Secondary | ICD-10-CM | POA: Diagnosis not present

## 2024-06-06 DIAGNOSIS — D631 Anemia in chronic kidney disease: Secondary | ICD-10-CM | POA: Diagnosis not present

## 2024-06-08 ENCOUNTER — Encounter (HOSPITAL_COMMUNITY)
Admission: RE | Disposition: A | Discharge status: Home / Self Care | Payer: Self-pay | Source: Ambulatory Visit | Attending: Vascular Surgery

## 2024-06-08 ENCOUNTER — Other Ambulatory Visit: Payer: Self-pay

## 2024-06-08 ENCOUNTER — Ambulatory Visit (HOSPITAL_COMMUNITY)
Admission: RE | Admit: 2024-06-08 | Discharge: 2024-06-08 | Disposition: A | Discharge status: Home / Self Care | Source: Ambulatory Visit | Attending: Vascular Surgery | Admitting: Vascular Surgery

## 2024-06-08 ENCOUNTER — Other Ambulatory Visit (HOSPITAL_COMMUNITY): Payer: Self-pay

## 2024-06-08 DIAGNOSIS — N186 End stage renal disease: Secondary | ICD-10-CM | POA: Insufficient documentation

## 2024-06-08 DIAGNOSIS — I120 Hypertensive chronic kidney disease with stage 5 chronic kidney disease or end stage renal disease: Secondary | ICD-10-CM | POA: Diagnosis not present

## 2024-06-08 DIAGNOSIS — Y832 Surgical operation with anastomosis, bypass or graft as the cause of abnormal reaction of the patient, or of later complication, without mention of misadventure at the time of the procedure: Secondary | ICD-10-CM | POA: Insufficient documentation

## 2024-06-08 DIAGNOSIS — D631 Anemia in chronic kidney disease: Secondary | ICD-10-CM | POA: Diagnosis not present

## 2024-06-08 DIAGNOSIS — Z635 Disruption of family by separation and divorce: Secondary | ICD-10-CM | POA: Insufficient documentation

## 2024-06-08 DIAGNOSIS — T82858A Stenosis of vascular prosthetic devices, implants and grafts, initial encounter: Secondary | ICD-10-CM | POA: Diagnosis not present

## 2024-06-08 DIAGNOSIS — D509 Iron deficiency anemia, unspecified: Secondary | ICD-10-CM | POA: Diagnosis not present

## 2024-06-08 DIAGNOSIS — Z992 Dependence on renal dialysis: Secondary | ICD-10-CM | POA: Diagnosis not present

## 2024-06-08 DIAGNOSIS — T82868A Thrombosis of vascular prosthetic devices, implants and grafts, initial encounter: Secondary | ICD-10-CM | POA: Diagnosis not present

## 2024-06-08 DIAGNOSIS — D688 Other specified coagulation defects: Secondary | ICD-10-CM | POA: Diagnosis not present

## 2024-06-08 DIAGNOSIS — N2581 Secondary hyperparathyroidism of renal origin: Secondary | ICD-10-CM | POA: Diagnosis not present

## 2024-06-08 HISTORY — PX: PERIPHERAL VASCULAR THROMBECTOMY: CATH118306

## 2024-06-08 SURGERY — PERIPHERAL VASCULAR THROMBECTOMY
Anesthesia: LOCAL | Site: Arm Upper | Laterality: Right

## 2024-06-08 MED ORDER — APIXABAN 5 MG PO TABS
5.0000 mg | ORAL_TABLET | Freq: Once | ORAL | Status: DC
Start: 2024-06-08 — End: 2024-06-08

## 2024-06-08 MED ORDER — HEPARIN SODIUM (PORCINE) 1000 UNIT/ML IJ SOLN
INTRAMUSCULAR | Status: DC | PRN
  Administered 2024-06-08: 10000 [IU] via INTRAVENOUS

## 2024-06-08 MED ORDER — HEPARIN SODIUM (PORCINE) 1000 UNIT/ML IJ SOLN
INTRAMUSCULAR | Status: AC
Start: 2024-06-08 — End: 2024-06-08
  Filled 2024-06-08: qty 10

## 2024-06-08 MED ORDER — APIXABAN 5 MG PO TABS
5.0000 mg | ORAL_TABLET | Freq: Two times a day (BID) | ORAL | 3 refills | Status: AC
  Filled 2024-06-08: qty 180, 90d supply, fill #0
  Filled 2024-08-29: qty 180, 90d supply, fill #1

## 2024-06-08 MED ORDER — LIDOCAINE HCL (PF) 1 % IJ SOLN
INTRAMUSCULAR | Status: AC
  Filled 2024-06-08: qty 30

## 2024-06-08 MED ORDER — HEPARIN (PORCINE) IN NACL 1000-0.9 UT/500ML-% IV SOLN
INTRAVENOUS | Status: DC | PRN
  Administered 2024-06-08: 500 mL via SURGICAL_CAVITY

## 2024-06-08 MED ORDER — LIDOCAINE HCL (PF) 1 % IJ SOLN
INTRAMUSCULAR | Status: DC | PRN
  Administered 2024-06-08 (×2): 10 mL

## 2024-06-08 MED ORDER — IODIXANOL 320 MG/ML IV SOLN
INTRAVENOUS | Status: DC | PRN
  Administered 2024-06-08: 25 mL via INTRAVENOUS

## 2024-06-08 SURGICAL SUPPLY — 15 items
BALLOON FOGARTY 5FR 40 (CATHETERS) IMPLANT
BALLOON MUSTANG 8X80X75 (BALLOONS) IMPLANT
CATH BEACON 5 .035 65 KMP TIP (CATHETERS) IMPLANT
CATH THROMB INTHRILL 4-10 65 (CATHETERS) IMPLANT
GLIDEWIRE ADV .035X180CM (WIRE) IMPLANT
KIT ENCORE 26 ADVANTAGE (KITS) IMPLANT
SET MICROPUNCTURE 5F STIFF (MISCELLANEOUS) IMPLANT
SHEATH INTHRILL 8FR 6 (SHEATH) IMPLANT
SHEATH PINNACLE R/O II 6F 4CM (SHEATH) IMPLANT
SHEATH PINNACLE R/O II 7F 4CM (SHEATH) IMPLANT
SHEATH PROBE COVER 6X72 (BAG) IMPLANT
TRAY PV CATH (CUSTOM PROCEDURE TRAY) ×2 IMPLANT
TUBING CIL FLEX 10 FLL-RA (TUBING) IMPLANT
WIRE BENTSON .035X145CM (WIRE) IMPLANT
WIRE TORQFLEX AUST .018X40CM (WIRE) IMPLANT

## 2024-06-08 NOTE — Op Note (Signed)
 DATE OF SERVICE: 06/08/2024  PATIENT:  Victoria [name]  68 y.o. female  PRE-OPERATIVE DIAGNOSIS:  end-stage renal disease; thrombosed RUE AVG  POST-OPERATIVE DIAGNOSIS:  Same  PROCEDURE:   1) Ultrasound guided right arm AV graft access (CPT [phone]) 2) percutaneous mechanical thrombectomy of right arm AVG with angioplasty of graft and outflow (CPT [phone]) 3) established outpatient evaluation and management - level 3 (CPT 99213)  SURGEON:  Debby SAILOR. Magda, MD  ASSISTANT: none  ANESTHESIA:   local  ESTIMATED BLOOD LOSS: min  LOCAL MEDICATIONS USED:  LIDOCAINE    COUNTS: confirmed correct.  PATIENT DISPOSITION:  PACU - hemodynamically stable.   Delay start of Pharmacological VTE agent (>24hrs) due to surgical blood loss or risk of bleeding: no  INDICATION FOR PROCEDURE: Victoria [name] is a 68 y.o. female with ESRD on HD. Right arm AVG has thrombosed. After careful discussion of risks, benefits, and alternatives the patient was offered percutaneous thrombectomy. The patient understood and wished to proceed.  OPERATIVE FINDINGS:  Right upper extremity AV graft thrombosed.  Good technical result from an inari thrombectomy for outflow. The inflow was thrombectomized with a over-the-wire fogarty. Angioplasty performed to graft. Good result was achieved with restoration.   DESCRIPTION OF PROCEDURE: After identification of the patient in the pre-operative holding area, the patient was transferred to the operating room. The patient was positioned supine on the operating room table.  The right upper extremity was prepped and draped in standard fashion. A surgical pause was performed confirming correct patient, procedure, and operative location.  The right upper extremity was anesthetized with subcutaneous injection of 1% lidocaine  over the area of planned access. Using ultrasound guidance, the right arm dialysis access was accessed with micropuncture technique.  Fistulogram was performed in  stations with the micro sheath.  See above for details.  The decision was made to intervene. Wire navigated into the central veins. Angiogram performed beyond the prior stenting. The veins beyond prior intervention were patent. The patient was heparinized through the catheter. Thrombectomy of the graft and outflow veins was performed with the Inari InThrill device. Good outflow was achieved.   Retrograde access was then performed with ultrasound access. A glidewire advantage was navigated into the brachial artery. Over-the-wire fogarty was then performed using a #5 balloon. Good inflow was achieved.  Follow up angiogram showed stenosis / thrombus in the graft. This was angioplastied with 8x26mm Mustang balloon. Follow up angiogram showed resolution. Thrill restored in the graft.  All endovascular equipment was removed.  A figure-of-eight stitch was applied to the exit site with good hemostasis.  Sterile bandage was applied.  Upon completion of the case instrument and sharps counts were confirmed correct. The patient was transferred to the PACU in good condition. I was present for all portions of the procedure.  PLAN: AVG has thrombosed multiple times in recent months. Should it thrombose again, I would not pursue further thrombectomy. She would need to pursue new access. I re-prescribed her Eliquis  today which she needs to take. Otherwise follow up PRN.  Debby SAILOR. Magda, MD [facility] Phone Number: [phone] 06/08/2024 12:07 PM

## 2024-06-08 NOTE — H&P (Signed)
 [facility] [place]  ASSESSMENT / PLAN: 68 y.o. female with ESRD on HD. Presents with thrombosed graft. I counseled the patient that her graft may not be salvageable, and we may need to place a tunneled dialysis catheter. She is understanding and willing to proceed.  CHIEF COMPLAINT: ESRD, thrombosed graft  HISTORY OF PRESENT ILLNESS: Victoria [name] is a 68 y.o. female who presents to [facility] dialysis center for evaluation for a thrombosed AV graft. The graft has thrombosed multiple times in the recent past. She has been recommended to start therapeutic anticoagulation, but has not started this therapy yet.  Past Medical History:  Diagnosis Date   Arthritis    Chronic bronchitis (HCC)    Complication of anesthesia ~ 2011   they gave me a medicine that swolled me and mouth burning up (08/23/2012)   Complication of anesthesia    slow to wake up   ESRD (end stage renal disease) on dialysis [facility]) Nephrologist-- dr deterding   ESRD due to HTN-; M/W/F; [address] (11/28/2015   GERD (gastroesophageal reflux disease)    no meds, diet controlled   History of acute pulmonary edema    2003   Hypertension    no longer on medications   Left patella fracture    Pneumonia    as a child   Seasonal allergies    Secondary hyperparathyroidism, renal (HCC)    s/p  total parathyroidectomy  2014   Thyroid  disease    Wears glasses    Wound dehiscence, surgical 11/28/2015    Past Surgical History:  Procedure Laterality Date   A/V SHUNT INTERVENTION N/A 02/23/2024   Procedure: A/V SHUNT INTERVENTION;  Surgeon: Pearline Norman RAMAN, MD;  Location: [facility];  Service: Cardiovascular;  Laterality: N/A;   A/V SHUNT INTERVENTION N/A 02/24/2024   Procedure: A/V SHUNT INTERVENTION;  Surgeon: Serene Gaile ORN, MD;  Location: [facility];  Service: Cardiovascular;  Laterality: N/A;   ANKLE FRACTURE SURGERY Right ~ 2005   has pins in it   APPLICATION OF WOUND VAC Left 11/28/2015   knee    APPLICATION OF WOUND VAC Left 11/28/2015   Procedure: APPLICATION OF WOUND VAC;  Surgeon: Redell Shoals, MD;  Location: [facility];  Service: Orthopedics;  Laterality: Left;   ARTERIOVENOUS GRAFT PLACEMENT  03/25/2005   Right forearm  w/  multiple Revision's    AV FISTULA PLACEMENT Right 04/13/2022   Procedure: INSERTION OF RIGHT ARTERIOVENOUS (AV) 4-28mm x 45cm GORE-TEX GRAFT;  Surgeon: Eliza Lonni RAMAN, MD;  Location: [facility] OR;  Service: Vascular;  Laterality: Right;   CARDIOVASCULAR STRESS TEST  08/24/2012   abnormal nuclear study/  inferolateral and anteroseptal areas of scar,  no ischemia/  normal LV function and wall motion, ef 77%   CATARACT EXTRACTION W/ INTRAOCULAR LENS IMPLANT Bilateral    cataract removal bilateral, lens implant in right eye   DIALYSIS FISTULA CREATION  09/20/1990   left upper arm ---  w/  Multiple Revision's until 2006   ECTOPIC PREGNANCY SURGERY  05/21/1969   FRACTURE SURGERY     HARDWARE REMOVAL Right 07/10/2020   Procedure: Removal of deep implant right fibula and tibia; steroid injection right ankle;  Surgeon: Kit Rush, MD;  Location: [facility];  Service: Orthopedics;  Laterality: Right;   I & D EXTREMITY Left 11/28/2015   Procedure: IRRIGATION AND DEBRIDEMENT LEFT KNEE WOUND ;  Surgeon: Redell Shoals, MD;  Location: [facility];  Service: Orthopedics;  Laterality: Left;   INCISION  AND DRAINAGE OF WOUND Left 11/28/2015   knee   INSERTION OF DIALYSIS CATHETER Right 03/07/2023   Procedure: JERYL GUIDED INSERTION OF TUNNELED DIALYSIS CATHETER;  Surgeon: Magda Debby SAILOR, MD;  Location: [facility] OR;  Service: Vascular;  Laterality: Right;   IR FLUORO GUIDE CV LINE LEFT  02/09/2022   ORIF PATELLA Left 10/31/2015   Procedure: OPEN REDUCTION INTERNAL (ORIF) FIXATION LEFT PATELLA;  Surgeon: Redell Shoals, MD;  Location: [facility];  Service: Orthopedics;  Laterality: Left;   PATELLAR TENDON REPAIR  10/03/2012   Procedure: PATELLA TENDON REPAIR;  Surgeon:  Franky CHRISTELLA Pointer, MD;  Location: [facility];  Service: Orthopedics;  Laterality: Right;   PATELLECTOMY  10/03/2012   Procedure: PATELLECTOMY;  Surgeon: Franky CHRISTELLA Pointer, MD;  Location: [facility];  Service: Orthopedics;  Laterality: Right;  RIGHT PARTIAL PATELLECTOMY AND PATELLA TENDON REPAIR   PERIPHERAL VASCULAR THROMBECTOMY  02/24/2024   Procedure: PERIPHERAL VASCULAR THROMBECTOMY;  Surgeon: Serene Gaile ORN, MD;  Location: [facility];  Service: Cardiovascular;;   PERIPHERAL VASCULAR THROMBECTOMY Right 05/16/2024   Procedure: PERIPHERAL VASCULAR THROMBECTOMY;  Surgeon: Pearline Norman RAMAN, MD;  Location: [facility];  Service: Cardiovascular;  Laterality: Right;   REVISION OF ARTERIOVENOUS GORETEX GRAFT  09/27/2012   Procedure: REVISION OF ARTERIOVENOUS GORETEX GRAFT;  Surgeon: Lynwood JONETTA Collum, MD;  Location: [facility] OR;  Service: Vascular;  Laterality: Right;  1) Replacement of venous half of loop with 6mm Gortex graft  2) Excision of erroded pseudoaneurysm of graft with primary closure.   REVISION OF ARTERIOVENOUS GORETEX GRAFT Right 01/23/2013   Procedure: REVISION OF ARTERIOVENOUS GORETEX GRAFT;  Surgeon: Redell LITTIE Door, MD;  Location: [facility] OR;  Service: Vascular;  Laterality: Right;  Using piece of 6mm x 20cm Gortex graft.    REVISION OF ARTERIOVENOUS GORETEX GRAFT Right 10/07/2015   Procedure: REVISION OF Right arm ARTERIOVENOUS GORETEX GRAFT;  Surgeon: Carlin FORBES Haddock, MD;  Location: [facility] OR;  Service: Vascular;  Laterality: Right;   REVISION OF ARTERIOVENOUS GORETEX GRAFT Right 02/17/2016   Procedure: REVISION OF ARTERIOVENOUS GORETEX GRAFT;  Surgeon: Carlin FORBES Haddock, MD;  Location: [facility] OR;  Service: Vascular;  Laterality: Right;   REVISON OF ARTERIOVENOUS FISTULA Right 03/07/2023   Procedure: REVISON OF ARTERIOVENOUS FISTULA ARM;  Surgeon: Magda Debby SAILOR, MD;  Location: [facility];  Service: Vascular;  Laterality: Right;   TOOTH EXTRACTION     10/2021   TOTAL ABDOMINAL HYSTERECTOMY  03/05/2005   w/  Right Ovarian  Cystectomy   TOTAL PARATHYROIDECTOMY/  THYROID  ISTHMUSECTOMY/  AUTOTRANSPLANTATION PARATHYROID  TISSUE TO LEFT BRACHIORIADIALIS MUSCLE  02/18/2011   TRANSTHORACIC ECHOCARDIOGRAM  10/31/2012   mild LVH,  grade 1 diastolic dysfunction,  ef 55-65%/  mild MR/  trivial TR   VENOUS ANGIOPLASTY  02/23/2024   Procedure: VENOUS ANGIOPLASTY;  Surgeon: Pearline Norman RAMAN, MD;  Location: [facility];  Service: Cardiovascular;;   VENOUS STENT  02/24/2024   Procedure: VENOUS STENT;  Surgeon: Serene Gaile ORN, MD;  Location: [facility];  Service: Cardiovascular;;    Family History  Problem Relation Age of Onset   Healthy Mother    Hypertension Father    Kidney failure Father    Hypertension Sister    Thyroid  disease Sister     Social History   Socioeconomic History   Marital status: Legally Separated    Spouse name: Not on file   Number of children: Not on file   Years of education: Not on file   Highest  education level: Not on file  Occupational History   Not on file  Tobacco Use   Smoking status: Never    Passive exposure: Never   Smokeless tobacco: Never  Vaping Use   Vaping status: Never Used  Substance and Sexual Activity   Alcohol use: Not Currently    Comment: occasionally drinks a few beers   Drug use: No   Sexual activity: Yes    Birth control/protection: Surgical    Comment: Hysterectomy  Other Topics Concern   Not on file  Social History Narrative   Not on file   Social Drivers of Health   Financial Resource Strain: Low Risk  (07/05/2023)   Overall Financial Resource Strain (CARDIA)    Difficulty of Paying Living Expenses: Not hard at all  Food Insecurity: No Food Insecurity (07/05/2023)   Hunger Vital Sign    Worried About Running Out of Food in the Last Year: Never true    Ran Out of Food in the Last Year: Never true  Transportation Needs: No Transportation Needs (07/05/2023)   PRAPARE - Administrator, Civil Service (Medical): No    Lack of Transportation  (Non-Medical): No  Physical Activity: Inactive (07/05/2023)   Exercise Vital Sign    Days of Exercise per Week: 0 days    Minutes of Exercise per Session: 0 min  Stress: No Stress Concern Present (07/05/2023)   [employer] of Occupational Health - Occupational Stress Questionnaire    Feeling of Stress : Not at all  Social Connections: Moderately Isolated (07/05/2023)   Social Connection and Isolation Panel    Frequency of Communication with Friends and Family: More than three times a week    Frequency of Social Gatherings with Friends and Family: Once a week    Attends Religious Services: 1 to 4 times per year    Active Member of [employer] or Organizations: No    Attends Banker Meetings: Never    Marital Status: Separated  Intimate Partner Violence: Not At Risk (07/05/2023)   Humiliation, Afraid, Rape, and Kick questionnaire    Fear of Current or Ex-Partner: No    Emotionally Abused: No    Physically Abused: No    Sexually Abused: No    Allergies  Allergen Reactions   Amlodipine Swelling   Cefazolin      Pt received Ancef  on 10/21.2021 without issue   Cephalosporins Anaphylaxis    Pt received Ancef  on 07/10/2020 without issue   Lisinopril Swelling and Other (See Comments)    made my chest hurt    No current facility-administered medications for this encounter.    PHYSICAL EXAM Vitals:   06/08/24 0937  BP: 134/89  Pulse: (!) 103  Resp: 12  Temp: 97.9 F (36.6 C)  TempSrc: Oral  SpO2: 92%   No acute distress Regular rate and rhythm Unlabored breathing Thrombosed right arm AVG  PERTINENT LABORATORY AND RADIOLOGIC DATA  Most recent CBC    Latest Ref Rng & Units 03/11/2023   11:48 PM 03/08/2023    2:41 AM 03/07/2023   11:09 AM  CBC  WBC 4.0 - 10.5 K/uL 10.3  7.4    Hemoglobin 12.0 - 15.0 g/dL 87.1  89.5  87.7   Hematocrit 36.0 - 46.0 % 39.4  32.0  36.0   Platelets 150 - 400 K/uL 210  175       Most recent CMP    Latest Ref Rng & Units  03/11/2023   11:48 PM 03/08/2023  2:41 AM 03/07/2023   11:09 AM  CMP  Glucose 70 - 99 mg/dL 890  890  891   BUN 8 - 23 mg/dL 30  76  56   Creatinine 0.44 - 1.00 mg/dL 2.15  85.23  86.79   Sodium 135 - 145 mmol/L 139  139  139   Potassium 3.5 - 5.1 mmol/L 3.8  4.5  4.5   Chloride 98 - 111 mmol/L 97  98  104   CO2 22 - 32 mmol/L 26  28    Calcium  8.9 - 10.3 mg/dL 9.6  8.6      Renal function CrCl cannot be calculated (Patient's most recent lab result is older than the maximum 21 days allowed.).  LDL Cholesterol  Date Value Ref Range Status  07/15/2020 91 0 - 99 mg/dL Final    Debby SAILOR. Magda, MD FACS [facility] Phone Number: [phone] 06/08/2024 10:59 AM   Total time spent on preparing this encounter including chart review, data review, collecting history, examining the patient, and coordinating care: 30 minutes.  Portions of this report may have been transcribed using voice recognition software.  Every effort has been made to ensure accuracy; however, inadvertent computerized transcription errors may still be present.

## 2024-06-11 ENCOUNTER — Encounter (HOSPITAL_COMMUNITY): Payer: Self-pay | Admitting: Vascular Surgery

## 2024-06-11 ENCOUNTER — Other Ambulatory Visit: Payer: Self-pay | Admitting: Nurse Practitioner

## 2024-06-11 DIAGNOSIS — D631 Anemia in chronic kidney disease: Secondary | ICD-10-CM | POA: Diagnosis not present

## 2024-06-11 DIAGNOSIS — D688 Other specified coagulation defects: Secondary | ICD-10-CM | POA: Diagnosis not present

## 2024-06-11 DIAGNOSIS — D509 Iron deficiency anemia, unspecified: Secondary | ICD-10-CM | POA: Diagnosis not present

## 2024-06-11 DIAGNOSIS — Z0000 Encounter for general adult medical examination without abnormal findings: Secondary | ICD-10-CM

## 2024-06-11 DIAGNOSIS — N2581 Secondary hyperparathyroidism of renal origin: Secondary | ICD-10-CM | POA: Diagnosis not present

## 2024-06-11 DIAGNOSIS — Z992 Dependence on renal dialysis: Secondary | ICD-10-CM | POA: Diagnosis not present

## 2024-06-11 DIAGNOSIS — N186 End stage renal disease: Secondary | ICD-10-CM | POA: Diagnosis not present

## 2024-06-13 DIAGNOSIS — N186 End stage renal disease: Secondary | ICD-10-CM | POA: Diagnosis not present

## 2024-06-13 DIAGNOSIS — D509 Iron deficiency anemia, unspecified: Secondary | ICD-10-CM | POA: Diagnosis not present

## 2024-06-13 DIAGNOSIS — N2581 Secondary hyperparathyroidism of renal origin: Secondary | ICD-10-CM | POA: Diagnosis not present

## 2024-06-13 DIAGNOSIS — D688 Other specified coagulation defects: Secondary | ICD-10-CM | POA: Diagnosis not present

## 2024-06-13 DIAGNOSIS — Z992 Dependence on renal dialysis: Secondary | ICD-10-CM | POA: Diagnosis not present

## 2024-06-13 DIAGNOSIS — D631 Anemia in chronic kidney disease: Secondary | ICD-10-CM | POA: Diagnosis not present

## 2024-06-15 DIAGNOSIS — D688 Other specified coagulation defects: Secondary | ICD-10-CM | POA: Diagnosis not present

## 2024-06-15 DIAGNOSIS — D509 Iron deficiency anemia, unspecified: Secondary | ICD-10-CM | POA: Diagnosis not present

## 2024-06-15 DIAGNOSIS — Z992 Dependence on renal dialysis: Secondary | ICD-10-CM | POA: Diagnosis not present

## 2024-06-15 DIAGNOSIS — N2581 Secondary hyperparathyroidism of renal origin: Secondary | ICD-10-CM | POA: Diagnosis not present

## 2024-06-15 DIAGNOSIS — N186 End stage renal disease: Secondary | ICD-10-CM | POA: Diagnosis not present

## 2024-06-15 DIAGNOSIS — D631 Anemia in chronic kidney disease: Secondary | ICD-10-CM | POA: Diagnosis not present

## 2024-06-18 DIAGNOSIS — D509 Iron deficiency anemia, unspecified: Secondary | ICD-10-CM | POA: Diagnosis not present

## 2024-06-18 DIAGNOSIS — N2581 Secondary hyperparathyroidism of renal origin: Secondary | ICD-10-CM | POA: Diagnosis not present

## 2024-06-18 DIAGNOSIS — Z992 Dependence on renal dialysis: Secondary | ICD-10-CM | POA: Diagnosis not present

## 2024-06-18 DIAGNOSIS — N186 End stage renal disease: Secondary | ICD-10-CM | POA: Diagnosis not present

## 2024-06-18 DIAGNOSIS — D631 Anemia in chronic kidney disease: Secondary | ICD-10-CM | POA: Diagnosis not present

## 2024-06-18 DIAGNOSIS — D688 Other specified coagulation defects: Secondary | ICD-10-CM | POA: Diagnosis not present

## 2024-06-19 DIAGNOSIS — N186 End stage renal disease: Secondary | ICD-10-CM | POA: Diagnosis not present

## 2024-06-19 DIAGNOSIS — I120 Hypertensive chronic kidney disease with stage 5 chronic kidney disease or end stage renal disease: Secondary | ICD-10-CM | POA: Diagnosis not present

## 2024-06-19 DIAGNOSIS — Z992 Dependence on renal dialysis: Secondary | ICD-10-CM | POA: Diagnosis not present

## 2024-07-04 ENCOUNTER — Telehealth: Payer: Self-pay

## 2024-07-04 NOTE — Telephone Encounter (Signed)
 Pt stated she was returning a call from our office. I did not see any notation regarding a message.  Please advise   Thank you

## 2024-07-05 NOTE — Telephone Encounter (Addendum)
 Called patient informed her that I did not give her a call and not sure who would have given her a call. I reminded her of her 08/07/24 appointment with Roselie. She thanked me for calling and stated she is not sure the name being said. I informed her that someone should give her a call back with reason for call. She again thanked me for calling.

## 2024-07-12 ENCOUNTER — Ambulatory Visit
Admission: RE | Admit: 2024-07-12 | Discharge: 2024-07-12 | Disposition: A | Discharge status: Home / Self Care | Source: Ambulatory Visit | Attending: Nurse Practitioner | Admitting: Nurse Practitioner

## 2024-07-12 DIAGNOSIS — Z0000 Encounter for general adult medical examination without abnormal findings: Secondary | ICD-10-CM

## 2024-07-12 DIAGNOSIS — Z1231 Encounter for screening mammogram for malignant neoplasm of breast: Secondary | ICD-10-CM | POA: Diagnosis not present

## 2024-07-13 ENCOUNTER — Ambulatory Visit

## 2024-07-17 ENCOUNTER — Ambulatory Visit: Payer: Self-pay | Admitting: Nurse Practitioner

## 2024-07-18 DIAGNOSIS — S92352K Displaced fracture of fifth metatarsal bone, left foot, subsequent encounter for fracture with nonunion: Secondary | ICD-10-CM | POA: Diagnosis not present

## 2024-07-24 ENCOUNTER — Encounter (HOSPITAL_COMMUNITY): Payer: Self-pay | Admitting: Orthopedic Surgery

## 2024-07-24 ENCOUNTER — Ambulatory Visit: Payer: Self-pay

## 2024-07-24 ENCOUNTER — Other Ambulatory Visit: Payer: Self-pay

## 2024-07-24 NOTE — Progress Notes (Signed)
 PCP - Nche, Roselie Rockford, NP  Cardiologist -   PPM/ICD - denies Device Orders - n/a Rep Notified - n/a  Chest x-ray - 12-01-23 EKG - DOS Stress Test - 08-24-12 ECHO - 07-21-17 Cardiac Cath -   CPAP - denies  DM denies  Blood Thinner Instructions: apixaban  (ELIQUIS ) Ok to continue per K. Paul  Aspirin  Instructions: denies  ERAS Protcol - NPO per patient  COVID TEST- n/a  Anesthesia review: Yes Hx of HTN, Asthma. Patient also reported today during phone cal that she was coughing up thick mucus and was congested, she also c/o a itchy throat denies temperature.  Patient verbally denies any shortness of breath, fever, cough and chest pain during phone call   -------------  SDW INSTRUCTIONS given:  Your procedure is scheduled on July 26, 2024.  Report to [facility] Main Entrance A at 9:15 A.M., and check in at the Admitting office.  Call this number if you have problems the morning of surgery:  [phone]   Remember:  Do not eat  or drink after midnight the night before your surgery      Take these medicines the morning of surgery with A SIP OF WATER  omeprazole  (PRILOSEC)    As of today, STOP taking any Aspirin  (unless otherwise instructed by your surgeon) Aleve, Naproxen, Ibuprofen , Motrin , Advil , Goody's, BC's, all herbal medications, fish oil, and all vitamins.                      Do not wear jewelry, make up, or nail polish            Do not wear lotions, powders, perfumes/colognes, or deodorant.            Do not shave 48 hours prior to surgery.  Men may shave face and neck.            Do not bring valuables to the hospital.            [facility] is not responsible for any belongings or valuables.  Do NOT Smoke (Tobacco/Vaping) 24 hours prior to your procedure If you use a CPAP at night, you may bring all equipment for your overnight stay.   Contacts, glasses, dentures or bridgework may not be worn into surgery.      For patients admitted to the  hospital, discharge time will be determined by your treatment team.   Patients discharged the day of surgery will not be allowed to drive home, and someone needs to stay with them for 24 hours.    Special instructions:   [facility]- Preparing For Surgery  Before surgery, you can play an important role. Because skin is not sterile, your skin needs to be as free of germs as possible. You can reduce the number of germs on your skin by washing with CHG (chlorahexidine gluconate) Soap before surgery.  CHG is an antiseptic cleaner which kills germs and bonds with the skin to continue killing germs even after washing.    Oral Hygiene is also important to reduce your risk of infection.  Remember - BRUSH YOUR TEETH THE MORNING OF SURGERY WITH YOUR REGULAR TOOTHPASTE  Please do not use if you have an allergy to CHG or antibacterial soaps. If your skin becomes reddened/irritated stop using the CHG.  Do not shave (including legs and underarms) for at least 48 hours prior to first CHG shower. It is OK to shave your face.  Please follow these instructions carefully.  Shower the NIGHT BEFORE SURGERY and the MORNING OF SURGERY with DIAL  Soap.   Pat yourself dry with a CLEAN TOWEL.  Wear CLEAN PAJAMAS to bed the night before surgery  Place CLEAN SHEETS on your bed the night of your first shower and DO NOT SLEEP WITH PETS.   Day of Surgery: Please shower morning of surgery  Wear Clean/Comfortable clothing the morning of surgery Do not apply any deodorants/lotions.   Remember to brush your teeth WITH YOUR REGULAR TOOTHPASTE.   Questions were answered. Patient verbalized understanding of instructions.

## 2024-07-24 NOTE — Telephone Encounter (Signed)
 Victoria [name] Pulmonary Triage - Initial Assessment Questions Chief Complaint (e.g., cough, sob, wheezing, fever, chills, sweat or additional symptoms) *Go to specific symptom protocol after initial questions. Cough  How long have symptoms been present? 5-6 months  Have you tested for COVID or Flu? Note: If not, ask patient if a home test can be taken. If so, instruct patient to call back for positive results. No. She states she tested negative for COVID, about a month ago. No flu test. Advised she take home combo flu and COVID test.  MEDICINES:   Have you used any OTC meds to help with symptoms? Yes If yes, ask What medications? Tylenol , Robitussin DM (ran out).  Have you used your inhalers/maintenance medication? No If yes, What medications? N/A.  If inhaler, ask How many puffs and how often? Note: Review instructions on medication in the chart. N/A.  OXYGEN: Do you wear supplemental oxygen? No If yes, How many liters are you supposed to use? N/A.  Do you monitor your oxygen levels? No If yes, What is your reading (oxygen level) today? N/A.  What is your usual oxygen saturation reading?  (Note: Pulmonary O2 sats should be 90% or greater) Unsure.            Copied from CRM [id number]. Topic: Clinical - Red Word Triage >> Jul 24, 2024  9:54 AM Russell PARAS wrote: Red Word that prompted transfer to Nurse Triage:   Symptoms for 5 to 6 months Productive cough- spitting up mucus( has odor), colored white and is thick Cough is worse at night when laying down Throat is scratchy Has been told symptoms due to allergies, but is not getting  Wheezing   Pt is not established Reason for Disposition  [1] Chest or rib pain AND [2] only occurs while coughing  Answer Assessment - Initial Assessment Questions E2C2 Pulmonary Triage - Initial Assessment Questions Chief Complaint (e.g., cough, sob, wheezing, fever, chills, sweat or additional symptoms) *Go to specific  symptom protocol after initial questions. Cough  How long have symptoms been present? 5-6 months  Have you tested for COVID or Flu? Note: If not, ask patient if a home test can be taken. If so, instruct patient to call back for positive results. No. She states she tested negative for COVID, about a month ago. No flu test. Advised she take home combo flu and COVID test.  MEDICINES:   Have you used any OTC meds to help with symptoms? Yes If yes, ask What medications? Tylenol , Robitussin DM (ran out).  Have you used your inhalers/maintenance medication? No If yes, What medications? N/A.  If inhaler, ask How many puffs and how often? Note: Review instructions on medication in the chart. N/A.  OXYGEN: Do you wear supplemental oxygen? No If yes, How many liters are you supposed to use? N/A.  Do you monitor your oxygen levels? No If yes, What is your reading (oxygen level) today? N/A.  What is your usual oxygen saturation reading?  (Note: Pulmonary O2 sats should be 90% or greater) Unsure.    Patient connected back to [facility] for new patient appointment with pulmonary.  1. ONSET: When did the cough begin?      5-6 months ago.  2. SEVERITY: How bad is the cough today?      Constant, every 30 minutes she states she will be coughing. Causes sides, back and chest pain/soreness after coughing fits.  3. SPUTUM: Describe the color of your sputum (e.g., none, dry cough; clear, white, yellow, green)  White and thick, odorous.   4. HEMOPTYSIS: Are you coughing up any blood? If so ask: How much? (e.g., flecks, streaks, tablespoons, etc.)     No.  5. DIFFICULTY BREATHING: Are you having difficulty breathing? If Yes, ask: How bad is it? (e.g., mild, moderate, severe)      Wheezing a lot in my chest, when she lies down at night. No wheezing during the daytime.  Denies any SOB at this time.  6. FEVER: Do you have a fever? If Yes, ask: What is your  temperature, how was it measured, and when did it start?     No.  7. CARDIAC HISTORY: Do you have any history of heart disease? (e.g., heart attack, congestive heart failure)      HTN.  8. LUNG HISTORY: Do you have any history of lung disease?  (e.g., pulmonary embolus, asthma, emphysema)     No.  9. PE RISK FACTORS: Do you have a history of blood clots? (or: recent major surgery, recent prolonged travel, bedridden)     No.  10. OTHER SYMPTOMS: Do you have any other symptoms? (e.g., runny nose, wheezing, chest pain)       Scratchy throat, runny nose, watery eyes.  11. TRAVEL: Have you traveled out of the country in the last month? (e.g., travel history, exposures)       No.  Protocols used: Cough - Chronic-A-AH

## 2024-07-24 NOTE — Progress Notes (Addendum)
 LOIS Mt made aware of patient c/o congestion and coughing up thick mucus. Also made him aware of no orders for eliquis , he reported it did not need to be held for this procedure. She also reported a itchy throat.

## 2024-07-28 ENCOUNTER — Encounter (HOSPITAL_COMMUNITY): Payer: Self-pay | Admitting: *Deleted

## 2024-07-28 ENCOUNTER — Ambulatory Visit (HOSPITAL_COMMUNITY): Admission: EM | Admit: 2024-07-28 | Discharge: 2024-07-28 | Disposition: A | Discharge status: Home / Self Care

## 2024-07-28 DIAGNOSIS — H1131 Conjunctival hemorrhage, right eye: Secondary | ICD-10-CM

## 2024-07-28 NOTE — ED Triage Notes (Signed)
 Pt states her right eye has been red since yesterday, she has used no meds for her sx.

## 2024-07-28 NOTE — ED Provider Notes (Signed)
 [facility]  Note:  This document was prepared using Conservation officer, historic buildings and may include unintentional dictation errors.  MRN: [id number] DOB: October 31, 1955  Subjective:   Victoria [name] is a 68 y.o. female presenting for severe redness to right eye since yesterday.  Patient denies pain, vision changes, blurred vision, pain with eye movement, headache, foreign body sensation, photophobia.  Patient denies any trauma or injury to the eye.  Denies any other secondary medical concerns at this time.  No current facility-administered medications for this encounter.  Current Outpatient Medications:    apixaban  (ELIQUIS ) 5 MG TABS tablet, Take 1 tablet (5 mg total) by mouth 2 (two) times daily., Disp: 180 tablet, Rfl: 3   calcium  acetate (PHOSLO ) 667 MG capsule, Take 1,334 mg by mouth 3 (three) times daily with meals., Disp: , Rfl:    omeprazole  (PRILOSEC) 20 MG capsule, Take 20 mg by mouth every morning., Disp: , Rfl:    midodrine  (PROAMATINE ) 10 MG tablet, Take 10 mg by mouth See admin instructions. Take 1 tablet by mouth three times a week as directed take one 30 minutes before starting dialysis and repeat x 1 halfway through tx., Disp: , Rfl:    Allergies  Allergen Reactions   Amlodipine Swelling   Cefazolin      Pt received Ancef  on 10/21.2021 without issue   Cephalosporins Anaphylaxis    Pt received Ancef  on 07/10/2020 without issue   Lisinopril Swelling and Other (See Comments)    made my chest hurt    Past Medical History:  Diagnosis Date   Arthritis    Asthma    Chronic bronchitis (HCC)    Complication of anesthesia ~ 2011   they gave me a medicine that swolled me and mouth burning up (08/23/2012)   Complication of anesthesia    slow to wake up   ESRD (end stage renal disease) on dialysis [facility]) Nephrologist-- dr deterding   ESRD due to HTN-; M/W/F; [address] (11/28/2015   GERD (gastroesophageal reflux disease)    no meds, diet controlled    History of acute pulmonary edema    2003   Hypertension    no longer on medications   Left patella fracture    Pneumonia    as a child   Seasonal allergies    Secondary hyperparathyroidism, renal    s/p  total parathyroidectomy  2014   Thyroid  disease    Wears glasses    Wound dehiscence, surgical 11/28/2015     Past Surgical History:  Procedure Laterality Date   A/V SHUNT INTERVENTION N/A 02/23/2024   Procedure: A/V SHUNT INTERVENTION;  Surgeon: Pearline Norman RAMAN, MD;  Location: [facility];  Service: Cardiovascular;  Laterality: N/A;   A/V SHUNT INTERVENTION N/A 02/24/2024   Procedure: A/V SHUNT INTERVENTION;  Surgeon: Serene Gaile ORN, MD;  Location: [facility];  Service: Cardiovascular;  Laterality: N/A;   ANKLE FRACTURE SURGERY Right ~ 2005   has pins in it   APPLICATION OF WOUND VAC Left 11/28/2015   knee   APPLICATION OF WOUND VAC Left 11/28/2015   Procedure: APPLICATION OF WOUND VAC;  Surgeon: Redell Shoals, MD;  Location: [facility];  Service: Orthopedics;  Laterality: Left;   ARTERIOVENOUS GRAFT PLACEMENT  03/25/2005   Right forearm  w/  multiple Revision's    AV FISTULA PLACEMENT Right 04/13/2022   Procedure: INSERTION OF RIGHT ARTERIOVENOUS (AV) 4-72mm x 45cm GORE-TEX GRAFT;  Surgeon: Eliza Lonni RAMAN, MD;  Location: [facility] OR;  Service:  Vascular;  Laterality: Right;   CARDIOVASCULAR STRESS TEST  08/24/2012   abnormal nuclear study/  inferolateral and anteroseptal areas of scar,  no ischemia/  normal LV function and wall motion, ef 77%   CATARACT EXTRACTION W/ INTRAOCULAR LENS IMPLANT Bilateral    cataract removal bilateral, lens implant in right eye   DIALYSIS FISTULA CREATION  09/20/1990   left upper arm ---  w/  Multiple Revision's until 2006   ECTOPIC PREGNANCY SURGERY  05/21/1969   FRACTURE SURGERY     HARDWARE REMOVAL Right 07/10/2020   Procedure: Removal of deep implant right fibula and tibia; steroid injection right ankle;  Surgeon: Kit Rush, MD;  Location: [facility];  Service: Orthopedics;  Laterality: Right;   I & D EXTREMITY Left 11/28/2015   Procedure: IRRIGATION AND DEBRIDEMENT LEFT KNEE WOUND ;  Surgeon: Redell Shoals, MD;  Location: [facility];  Service: Orthopedics;  Laterality: Left;   INCISION AND DRAINAGE OF WOUND Left 11/28/2015   knee   INSERTION OF DIALYSIS CATHETER Right 03/07/2023   Procedure: JERYL GUIDED INSERTION OF TUNNELED DIALYSIS CATHETER;  Surgeon: Magda Debby SAILOR, MD;  Location: [facility] OR;  Service: Vascular;  Laterality: Right;   IR FLUORO GUIDE CV LINE LEFT  02/09/2022   ORIF PATELLA Left 10/31/2015   Procedure: OPEN REDUCTION INTERNAL (ORIF) FIXATION LEFT PATELLA;  Surgeon: Redell Shoals, MD;  Location: [facility] [place];  Service: Orthopedics;  Laterality: Left;   PATELLAR TENDON REPAIR  10/03/2012   Procedure: PATELLA TENDON REPAIR;  Surgeon: Franky CHRISTELLA Pointer, MD;  [facility];  Service: Orthopedics;  Laterality: Right;   PATELLECTOMY  10/03/2012   Procedure: PATELLECTOMY;  Surgeon: Franky CHRISTELLA Pointer, MD;  [facility];  Service: Orthopedics;  Laterality: Right;  RIGHT PARTIAL PATELLECTOMY AND PATELLA TENDON REPAIR   PERIPHERAL VASCULAR THROMBECTOMY  02/24/2024   Procedure: PERIPHERAL VASCULAR THROMBECTOMY;  Surgeon: Serene Gaile ORN, MD;  Location: [facility];  Service: Cardiovascular;;   PERIPHERAL VASCULAR THROMBECTOMY Right 05/16/2024   Procedure: PERIPHERAL VASCULAR THROMBECTOMY;  Surgeon: Pearline Norman RAMAN, MD;  Location: [facility];  Service: Cardiovascular;  Laterality: Right;   PERIPHERAL VASCULAR THROMBECTOMY Right 06/08/2024   Procedure: PERIPHERAL VASCULAR THROMBECTOMY;  Surgeon: Magda Debby SAILOR, MD;  Location: [facility];  Service: Cardiovascular;  Laterality: Right;   REVISION OF ARTERIOVENOUS GORETEX GRAFT  09/27/2012   Procedure: REVISION OF ARTERIOVENOUS GORETEX GRAFT;  Surgeon: Lynwood JONETTA Collum, MD;  Location: [facility] OR;  Service: Vascular;  Laterality: Right;  1) Replacement of venous half of loop with 6mm  Gortex graft  2) Excision of erroded pseudoaneurysm of graft with primary closure.   REVISION OF ARTERIOVENOUS GORETEX GRAFT Right 01/23/2013   Procedure: REVISION OF ARTERIOVENOUS GORETEX GRAFT;  Surgeon: Redell LITTIE Door, MD;  Location: [facility] OR;  Service: Vascular;  Laterality: Right;  Using piece of 6mm x 20cm Gortex graft.    REVISION OF ARTERIOVENOUS GORETEX GRAFT Right 10/07/2015   Procedure: REVISION OF Right arm ARTERIOVENOUS GORETEX GRAFT;  Surgeon: Carlin FORBES Haddock, MD;  Location: [facility] OR;  Service: Vascular;  Laterality: Right;   REVISION OF ARTERIOVENOUS GORETEX GRAFT Right 02/17/2016   Procedure: REVISION OF ARTERIOVENOUS GORETEX GRAFT;  Surgeon: Carlin FORBES Haddock, MD;  Location: [facility] OR;  Service: Vascular;  Laterality: Right;   REVISON OF ARTERIOVENOUS FISTULA Right 03/07/2023   Procedure: REVISON OF ARTERIOVENOUS FISTULA ARM;  Surgeon: Magda Debby SAILOR, MD;  [facility];  Service: Vascular;  Laterality: Right;   TOOTH EXTRACTION  10/2021   TOTAL ABDOMINAL HYSTERECTOMY  03/05/2005   w/  Right Ovarian Cystectomy   TOTAL PARATHYROIDECTOMY/  THYROID  ISTHMUSECTOMY/  AUTOTRANSPLANTATION PARATHYROID  TISSUE TO LEFT BRACHIORIADIALIS MUSCLE  02/18/2011   TRANSTHORACIC ECHOCARDIOGRAM  10/31/2012   mild LVH,  grade 1 diastolic dysfunction,  ef 55-65%/  mild MR/  trivial TR   VENOUS ANGIOPLASTY  02/23/2024   Procedure: VENOUS ANGIOPLASTY;  Surgeon: Pearline Norman RAMAN, MD;  Location: [facility];  Service: Cardiovascular;;   VENOUS STENT  02/24/2024   Procedure: VENOUS STENT;  Surgeon: Serene Gaile ORN, MD;  Location: [facility];  Service: Cardiovascular;;    Family History  Problem Relation Age of Onset   Healthy Mother    Hypertension Father    Kidney failure Father    Hypertension Sister    Thyroid  disease Sister     Social History   Tobacco Use   Smoking status: Never    Passive exposure: Never   Smokeless tobacco: Never  Vaping Use   Vaping status: Never Used  Substance Use Topics    Alcohol use: Not Currently    Comment: occasionally drinks a few beers   Drug use: No    ROS Refer to HPI for ROS details.  Objective:    Vitals: BP 97/64 (BP Location: Left Arm)   Pulse 79   Temp 97.8 F (36.6 C) (Oral)   Resp 18   LMP  (LMP Unknown)   SpO2 95%   Physical Exam Vitals and nursing note reviewed.  Constitutional:      General: She is not in acute distress.    Appearance: Normal appearance. She is not ill-appearing.  HENT:     Head: Normocephalic.  Eyes:     General: Lids are normal. Vision grossly intact. Gaze aligned appropriately. No visual field deficit.       Right eye: No foreign body or discharge.        Left eye: No foreign body or discharge.     Extraocular Movements:     Right eye: Normal extraocular motion.     Left eye: Normal extraocular motion.     Conjunctiva/sclera:     Right eye: Right conjunctiva is not injected. Hemorrhage present. No exudate.    Left eye: Left conjunctiva is not injected. No exudate or hemorrhage.  Cardiovascular:     Rate and Rhythm: Normal rate.  Pulmonary:     Effort: Pulmonary effort is normal. No respiratory distress.  Skin:    General: Skin is warm and dry.     Capillary Refill: Capillary refill takes less than 2 seconds.  Neurological:     General: No focal deficit present.     Mental Status: She is alert and oriented to person, place, and time.  Psychiatric:        Mood and Affect: Mood normal.        Behavior: Behavior normal.     Procedures  No results found for this or any previous visit (from the past 24 hours).  Assessment and Plan :     Discharge Instructions       1. Subconjunctival hemorrhage of right eye (Primary) - Visual acuity screening performed in UC is within normal limits. - Subconjunctival hemorrhage may be a result of increased blood pressure, direct trauma to the eye, allergies, foreign body in eye.  - Most likely current therapy with apixaban  has resulted in blood vessel  rupture in the right eye causing blood underneath the surface of the sclera. -  These findings are not in any way harmful and should resolve on their own after 3 to 5 days. -Continue to monitor symptoms for any change in severity if there is any escalation of current symptoms or development of new symptoms follow-up in ER for further evaluation and management.      Gerrett Loman B Maan Zarcone   Keyuna Cuthrell, [place] B, NP 07/28/24 1344

## 2024-07-28 NOTE — Discharge Instructions (Addendum)
  1. Subconjunctival hemorrhage of right eye (Primary) - Visual acuity screening performed in UC is within normal limits. - Subconjunctival hemorrhage may be a result of increased blood pressure, direct trauma to the eye, allergies, foreign body in eye.  - Most likely current therapy with apixaban  has resulted in blood vessel rupture in the right eye causing blood underneath the surface of the sclera. - These findings are not in any way harmful and should resolve on their own after 3 to 5 days. -Continue to monitor symptoms for any change in severity if there is any escalation of current symptoms or development of new symptoms follow-up in ER for further evaluation and management.

## 2024-08-06 ENCOUNTER — Ambulatory Visit

## 2024-08-06 ENCOUNTER — Other Ambulatory Visit (HOSPITAL_COMMUNITY): Payer: Self-pay

## 2024-08-06 VITALS — BP 96/61 | HR 79 | Temp 97.6°F | Ht 63.0 in | Wt 175.0 lb

## 2024-08-06 DIAGNOSIS — R053 Chronic cough: Secondary | ICD-10-CM

## 2024-08-06 DIAGNOSIS — I120 Hypertensive chronic kidney disease with stage 5 chronic kidney disease or end stage renal disease: Secondary | ICD-10-CM | POA: Diagnosis not present

## 2024-08-06 DIAGNOSIS — Z992 Dependence on renal dialysis: Secondary | ICD-10-CM

## 2024-08-06 DIAGNOSIS — N186 End stage renal disease: Secondary | ICD-10-CM

## 2024-08-06 DIAGNOSIS — R600 Localized edema: Secondary | ICD-10-CM

## 2024-08-06 DIAGNOSIS — Z9109 Other allergy status, other than to drugs and biological substances: Secondary | ICD-10-CM | POA: Diagnosis not present

## 2024-08-06 DIAGNOSIS — K449 Diaphragmatic hernia without obstruction or gangrene: Secondary | ICD-10-CM

## 2024-08-06 LAB — CBC WITH DIFFERENTIAL/PLATELET
Basophils Absolute: 0.1 K/uL (ref 0.0–0.1)
Basophils Relative: 1.2 % (ref 0.0–3.0)
Eosinophils Absolute: 0.4 K/uL (ref 0.0–0.7)
Eosinophils Relative: 6.1 % — ABNORMAL HIGH (ref 0.0–5.0)
HCT: 36.7 % (ref 36.0–46.0)
Hemoglobin: 11.9 g/dL — ABNORMAL LOW (ref 12.0–15.0)
Lymphocytes Relative: 15.9 % (ref 12.0–46.0)
Lymphs Abs: 1.1 K/uL (ref 0.7–4.0)
MCHC: 32.5 g/dL (ref 30.0–36.0)
MCV: 76.6 fl — ABNORMAL LOW (ref 78.0–100.0)
Monocytes Absolute: 0.5 K/uL (ref 0.1–1.0)
Monocytes Relative: 6.7 % (ref 3.0–12.0)
Neutro Abs: 5 K/uL (ref 1.4–7.7)
Neutrophils Relative %: 70.1 % (ref 43.0–77.0)
Platelets: 279 K/uL (ref 150.0–400.0)
RBC: 4.79 Mil/uL (ref 3.87–5.11)
RDW: 16.5 % — ABNORMAL HIGH (ref 11.5–15.5)
WBC: 7.2 K/uL (ref 4.0–10.5)

## 2024-08-06 LAB — BRAIN NATRIURETIC PEPTIDE: Pro B Natriuretic peptide (BNP): 55 pg/mL (ref 0.0–100.0)

## 2024-08-06 MED ORDER — IPRATROPIUM BROMIDE 0.03 % NA SOLN
2.0000 | Freq: Two times a day (BID) | NASAL | 12 refills | Status: DC
  Filled 2024-08-06: qty 30, 75d supply, fill #0

## 2024-08-06 MED ORDER — MONTELUKAST SODIUM 10 MG PO TABS: 10.0000 mg | ORAL_TABLET | Freq: Every day | ORAL | 11 refills | Status: DC

## 2024-08-06 MED ORDER — IPRATROPIUM BROMIDE 0.03 % NA SOLN: 2.0000 | Freq: Two times a day (BID) | NASAL | 12 refills | Status: DC

## 2024-08-06 MED ORDER — MONTELUKAST SODIUM 10 MG PO TABS
10.0000 mg | ORAL_TABLET | Freq: Every day | ORAL | 11 refills | Status: DC
  Filled 2024-08-06: qty 30, 30d supply, fill #0

## 2024-08-06 MED ORDER — ALBUTEROL SULFATE HFA 108 (90 BASE) MCG/ACT IN AERS
2.0000 | INHALATION_SPRAY | Freq: Four times a day (QID) | RESPIRATORY_TRACT | 6 refills | Status: AC | PRN
  Filled 2024-08-06: qty 6.7, 25d supply, fill #0

## 2024-08-06 NOTE — Patient Instructions (Addendum)
 It was a pleasure to see you today. Please have your labs drawn in our clinic today. Your pulmonary function test will be scheduled closer to your next follow up visit. We will get Chest Xray today.  Start taking singulair daily for allergic cough New nasal spray has been sent as well Call us  if unable to receive the prescriptions or if they are expensive

## 2024-08-06 NOTE — Progress Notes (Signed)
 New Patient Pulmonology Office Visit   Subjective:  Patient ID: Victoria [name], female    DOB: [birth year]  MRN: [id number]  Referred by: Katheen Roselie Rockford, NP  CC:  Chief Complaint  Patient presents with   Consult    Scratchy throat and coughing up thick white mucus. Started 5 months ago.    HPI Victoria [name] is a 68 y.o. female who is referred to this clinic for chronic cough.  Discussed the use of AI scribe software for clinical note transcription with the patient, who gave verbal consent to proceed.  History of Present Illness Victoria [name] is a 68 year old female with dialysis-dependent kidney failure who presents with persistent cough and scratchy throat.  She has been experiencing a persistent cough and scratchy throat for approximately five months. These symptoms are particularly bothersome in the morning and at night, especially when lying down, and are described as 'real, real scratchy.' The cough disrupts her sleep, causing her to be 'up all night coughing,' and is accompanied by spitting up mucus. Despite using treatments such as Flonase nasal spray, she has not found relief.  She has a significant medical history of kidney failure secondary to high blood pressure, requiring dialysis for 31 years. She does not produce urine and experiences leg swelling. Her blood pressure is variable, sometimes running low. She attends dialysis sessions regularly on Mondays, Wednesdays, and Fridays.  She was informed during the visit that she has a large hernia, which she was previously unaware of. She describes her stomach as 'getting big' and experiences pain and swelling, particularly when lying flat, which may contribute to her cough due to acid reflux  Additionally, she mentions a recent foot injury from June, which has not healed and required surgery to insert a stent. She attributes the delayed healing to her dialysis treatment.  She does not smoke and lives alone. There is a  strong family history of high blood pressure.  Denies wheezing, shortness of breath      ROS Review of symptoms negative except mentioned above    Allergies: Amlodipine, Cefazolin , Cephalosporins, and Lisinopril  Current Outpatient Medications:    albuterol  (VENTOLIN  HFA) 108 (90 Base) MCG/ACT inhaler, Inhale 2 puffs into the lungs every 6 (six) hours as needed for wheezing or shortness of breath., Disp: 8 g, Rfl: 6   apixaban  (ELIQUIS ) 5 MG TABS tablet, Take 1 tablet (5 mg total) by mouth 2 (two) times daily., Disp: 180 tablet, Rfl: 3   calcium  acetate (PHOSLO ) 667 MG capsule, Take 1,334 mg by mouth 3 (three) times daily with meals., Disp: , Rfl:    midodrine  (PROAMATINE ) 10 MG tablet, Take 10 mg by mouth See admin instructions. Take 1 tablet by mouth three times a week as directed take one 30 minutes before starting dialysis and repeat x 1 halfway through tx., Disp: , Rfl:    omeprazole  (PRILOSEC) 20 MG capsule, Take 20 mg by mouth every morning., Disp: , Rfl:    ipratropium (ATROVENT) 0.03 % nasal spray, Place 2 sprays into both nostrils every 12 (twelve) hours., Disp: 30 mL, Rfl: 12   montelukast (SINGULAIR) 10 MG tablet, Take 1 tablet (10 mg total) by mouth at bedtime., Disp: 30 tablet, Rfl: 11 Past Medical History:  Diagnosis Date   Arthritis    Asthma    Chronic bronchitis (HCC)    Complication of anesthesia ~ 2011   they gave me a medicine that swolled me and mouth burning up (08/23/2012)  Complication of anesthesia    slow to wake up   ESRD (end stage renal disease) on dialysis [facility]) Nephrologist-- dr deterding   ESRD due to HTN-; M/W/F; [address] (11/28/2015   GERD (gastroesophageal reflux disease)    no meds, diet controlled   History of acute pulmonary edema    2003   Hypertension    no longer on medications   Left patella fracture    Pneumonia    as a child   Seasonal allergies    Secondary hyperparathyroidism, renal    s/p  total parathyroidectomy  2014    Thyroid  disease    Wears glasses    Wound dehiscence, surgical 11/28/2015   Past Surgical History:  Procedure Laterality Date   A/V SHUNT INTERVENTION N/A 02/23/2024   Procedure: A/V SHUNT INTERVENTION;  Surgeon: Pearline Norman RAMAN, MD;  Location: [facility];  Service: Cardiovascular;  Laterality: N/A;   A/V SHUNT INTERVENTION N/A 02/24/2024   Procedure: A/V SHUNT INTERVENTION;  Surgeon: Serene Gaile ORN, MD;  Location: [facility];  Service: Cardiovascular;  Laterality: N/A;   ANKLE FRACTURE SURGERY Right ~ 2005   has pins in it   APPLICATION OF WOUND VAC Left 11/28/2015   knee   APPLICATION OF WOUND VAC Left 11/28/2015   Procedure: APPLICATION OF WOUND VAC;  Surgeon: Redell Shoals, MD;  Location: [facility];  Service: Orthopedics;  Laterality: Left;   ARTERIOVENOUS GRAFT PLACEMENT  03/25/2005   Right forearm  w/  multiple Revision's    AV FISTULA PLACEMENT Right 04/13/2022   Procedure: INSERTION OF RIGHT ARTERIOVENOUS (AV) 4-16mm x 45cm GORE-TEX GRAFT;  Surgeon: Eliza Lonni RAMAN, MD;  Location: [facility] OR;  Service: Vascular;  Laterality: Right;   CARDIOVASCULAR STRESS TEST  08/24/2012   abnormal nuclear study/  inferolateral and anteroseptal areas of scar,  no ischemia/  normal LV function and wall motion, ef 77%   CATARACT EXTRACTION W/ INTRAOCULAR LENS IMPLANT Bilateral    cataract removal bilateral, lens implant in right eye   DIALYSIS FISTULA CREATION  09/20/1990   left upper arm ---  w/  Multiple Revision's until 2006   ECTOPIC PREGNANCY SURGERY  05/21/1969   FRACTURE SURGERY     HARDWARE REMOVAL Right 07/10/2020   Procedure: Removal of deep implant right fibula and tibia; steroid injection right ankle;  Surgeon: Kit Rush, MD;  Location: [facility];  Service: Orthopedics;  Laterality: Right;   I & D EXTREMITY Left 11/28/2015   Procedure: IRRIGATION AND DEBRIDEMENT LEFT KNEE WOUND ;  Surgeon: Redell Shoals, MD;  Location: [facility];  Service: Orthopedics;  Laterality: Left;   INCISION AND  DRAINAGE OF WOUND Left 11/28/2015   knee   INSERTION OF DIALYSIS CATHETER Right 03/07/2023   Procedure: JERYL GUIDED INSERTION OF TUNNELED DIALYSIS CATHETER;  Surgeon: Magda Debby SAILOR, MD;  Location: [facility] OR;  Service: Vascular;  Laterality: Right;   IR FLUORO GUIDE CV LINE LEFT  02/09/2022   ORIF PATELLA Left 10/31/2015   Procedure: OPEN REDUCTION INTERNAL (ORIF) FIXATION LEFT PATELLA;  Surgeon: Redell Shoals, MD;  Location: [facility];  Service: Orthopedics;  Laterality: Left;   PATELLAR TENDON REPAIR  10/03/2012   Procedure: PATELLA TENDON REPAIR;  Surgeon: Franky CHRISTELLA Pointer, MD;  Location: [facility];  Service: Orthopedics;  Laterality: Right;   PATELLECTOMY  10/03/2012   Procedure: PATELLECTOMY;  Surgeon: Franky CHRISTELLA Pointer, MD;  Location: [facility];  Service: Orthopedics;  Laterality: Right;  RIGHT PARTIAL PATELLECTOMY AND PATELLA TENDON REPAIR  PERIPHERAL VASCULAR THROMBECTOMY  02/24/2024   Procedure: PERIPHERAL VASCULAR THROMBECTOMY;  Surgeon: Serene Gaile ORN, MD;  Location: [facility];  Service: Cardiovascular;;   PERIPHERAL VASCULAR THROMBECTOMY Right 05/16/2024   Procedure: PERIPHERAL VASCULAR THROMBECTOMY;  Surgeon: Pearline Norman RAMAN, MD;  Location: [facility];  Service: Cardiovascular;  Laterality: Right;   PERIPHERAL VASCULAR THROMBECTOMY Right 06/08/2024   Procedure: PERIPHERAL VASCULAR THROMBECTOMY;  Surgeon: Magda Debby SAILOR, MD;  Location: [facility];  Service: Cardiovascular;  Laterality: Right;   REVISION OF ARTERIOVENOUS GORETEX GRAFT  09/27/2012   Procedure: REVISION OF ARTERIOVENOUS GORETEX GRAFT;  Surgeon: Lynwood JONETTA Collum, MD;  Location: [facility] OR;  Service: Vascular;  Laterality: Right;  1) Replacement of venous half of loop with 6mm Gortex graft  2) Excision of erroded pseudoaneurysm of graft with primary closure.   REVISION OF ARTERIOVENOUS GORETEX GRAFT Right 01/23/2013   Procedure: REVISION OF ARTERIOVENOUS GORETEX GRAFT;  Surgeon: Redell LITTIE Door, MD;  Location: [facility] OR;   Service: Vascular;  Laterality: Right;  Using piece of 6mm x 20cm Gortex graft.    REVISION OF ARTERIOVENOUS GORETEX GRAFT Right 10/07/2015   Procedure: REVISION OF Right arm ARTERIOVENOUS GORETEX GRAFT;  Surgeon: Carlin FORBES Haddock, MD;  Location: [facility] OR;  Service: Vascular;  Laterality: Right;   REVISION OF ARTERIOVENOUS GORETEX GRAFT Right 02/17/2016   Procedure: REVISION OF ARTERIOVENOUS GORETEX GRAFT;  Surgeon: Carlin FORBES Haddock, MD;  Location: [facility] OR;  Service: Vascular;  Laterality: Right;   REVISON OF ARTERIOVENOUS FISTULA Right 03/07/2023   Procedure: REVISON OF ARTERIOVENOUS FISTULA ARM;  Surgeon: Magda Debby SAILOR, MD;  Location: [facility];  Service: Vascular;  Laterality: Right;   TOOTH EXTRACTION     10/2021   TOTAL ABDOMINAL HYSTERECTOMY  03/05/2005   w/  Right Ovarian Cystectomy   TOTAL PARATHYROIDECTOMY/  THYROID  ISTHMUSECTOMY/  AUTOTRANSPLANTATION PARATHYROID  TISSUE TO LEFT BRACHIORIADIALIS MUSCLE  02/18/2011   TRANSTHORACIC ECHOCARDIOGRAM  10/31/2012   mild LVH,  grade 1 diastolic dysfunction,  ef 55-65%/  mild MR/  trivial TR   VENOUS ANGIOPLASTY  02/23/2024   Procedure: VENOUS ANGIOPLASTY;  Surgeon: Pearline Norman RAMAN, MD;  Location: [facility];  Service: Cardiovascular;;   VENOUS STENT  02/24/2024   Procedure: VENOUS STENT;  Surgeon: Serene Gaile ORN, MD;  Location: [facility];  Service: Cardiovascular;;   Family History  Problem Relation Age of Onset   Healthy Mother    Hypertension Father    Kidney failure Father    Hypertension Sister    Thyroid  disease Sister    Social History   Socioeconomic History   Marital status: Legally Separated    Spouse name: Not on file   Number of children: Not on file   Years of education: Not on file   Highest education level: Not on file  Occupational History   Not on file  Tobacco Use   Smoking status: Never    Passive exposure: Never   Smokeless tobacco: Never  Vaping Use   Vaping status: Never Used  Substance and Sexual Activity    Alcohol use: Not Currently    Comment: occasionally drinks a few beers   Drug use: No   Sexual activity: Yes    Birth control/protection: Surgical    Comment: Hysterectomy  Other Topics Concern   Not on file  Social History Narrative   Not on file   Social Drivers of Health   Financial Resource Strain: Low Risk  (07/05/2023)   Overall Financial Resource Strain (CARDIA)  Difficulty of Paying Living Expenses: Not hard at all  Food Insecurity: No Food Insecurity (07/05/2023)   Hunger Vital Sign    Worried About Running Out of Food in the Last Year: Never true    Ran Out of Food in the Last Year: Never true  Transportation Needs: No Transportation Needs (07/05/2023)   PRAPARE - Administrator, Civil Service (Medical): No    Lack of Transportation (Non-Medical): No  Physical Activity: Inactive (07/05/2023)   Exercise Vital Sign    Days of Exercise per Week: 0 days    Minutes of Exercise per Session: 0 min  Stress: No Stress Concern Present (07/05/2023)   [employer] of Occupational Health - Occupational Stress Questionnaire    Feeling of Stress : Not at all  Social Connections: Moderately Isolated (07/05/2023)   Social Connection and Isolation Panel    Frequency of Communication with Friends and Family: More than three times a week    Frequency of Social Gatherings with Friends and Family: Once a week    Attends Religious Services: 1 to 4 times per year    Active Member of [employer] or Organizations: No    Attends Banker Meetings: Never    Marital Status: Separated  Intimate Partner Violence: Not At Risk (07/05/2023)   Humiliation, Afraid, Rape, and Kick questionnaire    Fear of Current or Ex-Partner: No    Emotionally Abused: No    Physically Abused: No    Sexually Abused: No         Objective:  BP 96/61   Pulse 79   Temp 97.6 F (36.4 C) (Oral)   Ht 5' 3 (1.6 m)   Wt 175 lb (79.4 kg)   LMP  (LMP Unknown)   SpO2 97%   BMI 31.00  kg/m    Physical Exam Constitutional:      General: She is not in acute distress.    Appearance: Normal appearance.  HENT:     Nose: Congestion present.     Mouth/Throat:     Mouth: Mucous membranes are moist.     Pharynx: Posterior oropharyngeal erythema present.  Cardiovascular:     Rate and Rhythm: Normal rate.  Pulmonary:     Effort: No respiratory distress.     Breath sounds: No wheezing or rales.  Musculoskeletal:     Right lower leg: No edema.     Left lower leg: No edema.  Skin:    General: Skin is warm.  Neurological:     Mental Status: She is alert and oriented to person, place, and time.  Psychiatric:        Mood and Affect: Mood normal.     Diagnostic Review:    Pft     No data to display             Results RADIOLOGY Chest  X-ray as far back as 2021: Large hernia        Assessment & Plan:   Assessment & Plan Chronic cough I explained to the patient in detail that chronic cough could be a manifestation of chronic sinusitis with post nasal drip, cough variant asthma, Nonasthmatic eosinophilic bronchitis, gastroesophageal reflux, laryngopharyngeal reflux, parenchymal lung disease like fibrosis, pulmonary edema from congestive heart failure, habitual cough or lung cancer. She has large hernia and reports reflux like symptoms, cough can be related to it She also has sinus congestion and is at risk for pulmonary edema due to ESRD Doesn't look volume overloaded  today Empiric as needed albuterol . ICS containing maintenance inhaler if PFT/Labs suggest asthma Orders:   Pulmonary function test; Future   CBC with Differential/Platelet; Future   IgE; Future   DG Chest 2 View; Future   Ambulatory referral to Gastroenterology   albuterol  (VENTOLIN  HFA) 108 (90 Base) MCG/ACT inhaler; Inhale 2 puffs into the lungs every 6 (six) hours as needed for wheezing or shortness of breath.  Pedal edema Minimal Encouraged compliance with HD Orders:   B Nat  Peptide; Future  ESRD due to hypertension (HCC) Over 3 decades Compliant on HD    ESRD needing dialysis (HCC) As above    Hiatal hernia She has large hernia and reports reflux like symptoms, cough can be related to it Orders:   Ambulatory referral to Gastroenterology  Environmental allergies Will start singulair Discussed potential serious neuropsychiatric events like depression, anxiety, suicidal thoughts, hallucinations, memory problems. Patient is aware and is willing to try. Advised to alert us  and to stop singulair  if those Adverse effects are noticed. Start atrovent nasal spray, suboptimal response to flonase Orders:   CBC with Differential/Platelet; Future   IgE; Future   albuterol  (VENTOLIN  HFA) 108 (90 Base) MCG/ACT inhaler; Inhale 2 puffs into the lungs every 6 (six) hours as needed for wheezing or shortness of breath.    Thank you for the opportunity to take part in the care of Victoria [name]   Return in about 5 weeks (around 09/10/2024).   Tatsuya Okray Pleas, MD [facility] Office: [phone]

## 2024-08-07 ENCOUNTER — Telehealth: Payer: Self-pay

## 2024-08-07 ENCOUNTER — Ambulatory Visit: Admitting: Nurse Practitioner

## 2024-08-07 ENCOUNTER — Encounter: Payer: Self-pay | Admitting: Nurse Practitioner

## 2024-08-07 ENCOUNTER — Other Ambulatory Visit: Payer: Self-pay

## 2024-08-07 ENCOUNTER — Encounter (HOSPITAL_COMMUNITY): Payer: Self-pay | Admitting: Orthopedic Surgery

## 2024-08-07 VITALS — BP 108/62 | HR 56 | Temp 98.3°F | Ht 64.0 in | Wt 175.4 lb

## 2024-08-07 DIAGNOSIS — R053 Chronic cough: Secondary | ICD-10-CM | POA: Insufficient documentation

## 2024-08-07 DIAGNOSIS — Z5320 Procedure and treatment not carried out because of patient's decision for unspecified reasons: Secondary | ICD-10-CM | POA: Diagnosis not present

## 2024-08-07 DIAGNOSIS — Z780 Asymptomatic menopausal state: Secondary | ICD-10-CM | POA: Diagnosis not present

## 2024-08-07 DIAGNOSIS — E782 Mixed hyperlipidemia: Secondary | ICD-10-CM

## 2024-08-07 DIAGNOSIS — D631 Anemia in chronic kidney disease: Secondary | ICD-10-CM

## 2024-08-07 DIAGNOSIS — N186 End stage renal disease: Secondary | ICD-10-CM

## 2024-08-07 DIAGNOSIS — Z992 Dependence on renal dialysis: Secondary | ICD-10-CM

## 2024-08-07 LAB — IGE: IgE (Immunoglobulin E), Serum: 418 kU/L — ABNORMAL HIGH (ref <=114)

## 2024-08-07 NOTE — Patient Instructions (Addendum)
 You will be contacted to schedule appointment with GI Sign medical release to get records from nephrology-lab results You will be contacted to schedule appointment for bon density scan  Hiatal Hernia  A hiatal hernia occurs when part of the stomach slides above the muscle that separates the abdomen from the chest (diaphragm). A person can be born with a hiatal hernia (congenital), or it may develop over time. In almost all cases of hiatal hernia, only the top part of the stomach pushes through the diaphragm. Many people have a hiatal hernia with no symptoms. The larger the hernia, the more likely it is that you will have symptoms. In some cases, a hiatal hernia allows stomach acid to flow back into the tube that carries food from your mouth to your stomach (esophagus). This may cause heartburn symptoms. The development of heartburn symptoms may mean that you have a condition called gastroesophageal reflux disease (GERD). What are the causes? This condition is caused by a weakness in the opening (hiatus) where the esophagus passes through the diaphragm to attach to the upper part of the stomach. A person may be born with a weakness in the hiatus, or a weakness can develop over time. What increases the risk? This condition is more likely to develop in: Older people. Age is a major risk factor for a hiatal hernia, especially if you are over the age of 71. Pregnant women. People who are overweight. People who have frequent constipation. What are the signs or symptoms? Symptoms of this condition usually develop in the form of GERD symptoms. Symptoms include: Heartburn. Upset stomach (indigestion). Trouble swallowing. Coughing or wheezing. Wheezing is making high-pitched whistling sounds when you breathe. Sore throat. Chest pain. Nausea and vomiting. How is this diagnosed? This condition may be diagnosed during testing for GERD. Tests that may be done include: X-rays of your stomach or  chest. An upper gastrointestinal (GI) series. This is an X-ray exam of your GI tract that is taken after you swallow a chalky liquid that shows up clearly on the X-ray. Endoscopy. This is a procedure to look into your stomach using a thin, flexible tube that has a tiny camera and light on the end of it. How is this treated? This condition may be treated by: Dietary and lifestyle changes to help reduce GERD symptoms. Medicines. These may include: Over-the-counter antacids. Medicines that make your stomach empty more quickly. Medicines that block the production of stomach acid (H2 blockers). Stronger medicines to reduce stomach acid (proton pump inhibitors). Surgery to repair the hernia, if other treatments are not helping. If you have no symptoms, you may not need treatment. Follow these instructions at home: Lifestyle and activity Do not use any products that contain nicotine or tobacco. These products include cigarettes, chewing tobacco, and vaping devices, such as e-cigarettes. If you need help quitting, ask your health care provider. Try to achieve and maintain a healthy body weight. Avoid putting pressure on your abdomen. Anything that puts pressure on your abdomen increases the amount of acid that may be pushed up into your esophagus. Avoid bending over, especially after eating. Raise the head of your bed by putting blocks under the legs. This keeps your head and esophagus higher than your stomach. Do not wear tight clothing around your chest or stomach. Try not to strain when having a bowel movement, when urinating, or when lifting heavy objects. Eating and drinking Avoid foods that can worsen GERD symptoms. These may include: Fatty foods, like fried foods. Citrus  fruits, like oranges or lemon. Other foods and drinks that contain acid, like orange juice or tomatoes. Spicy food. Chocolate. Eat frequent small meals instead of three large meals a day. This helps prevent your stomach  from getting too full. Eat slowly. Do not lie down right after eating. Do not eat 1-2 hours before bed. Do not drink beverages with caffeine. These include cola, coffee, cocoa, and tea. Do not drink alcohol. General instructions Take over-the-counter and prescription medicines only as told by your health care provider. Keep all follow-up visits. Your health care provider will want to check that any new prescribed medicines are helping your symptoms. Contact a health care provider if: Your symptoms are not controlled with medicines or lifestyle changes. You are having trouble swallowing. You have coughing or wheezing that will not go away. Your pain is getting worse. Your pain spreads to your arms, neck, jaw, teeth, or back. You feel nauseous or you vomit. Get help right away if: You have shortness of breath. You vomit blood. You have bright red blood in your stools. You have black, tarry stools. These symptoms may be an emergency. Get help right away. Call 911. Do not wait to see if the symptoms will go away. Do not drive yourself to the hospital. Summary A hiatal hernia occurs when part of the stomach slides above the muscle that separates the abdomen from the chest. A person may be born with a weakness in the hiatus, or a weakness can develop over time. Symptoms of a hiatal hernia may include heartburn, trouble swallowing, or sore throat. Management of a hiatal hernia includes eating frequent small meals instead of three large meals a day. Get help right away if you vomit blood, have bright red blood in your stools, or have black, tarry stools. This information is not intended to replace advice given to you by your health care provider. Make sure you discuss any questions you have with your health care provider. Document Revised: 11/03/2021 Document Reviewed: 11/03/2021 Elsevier Patient Education  2024 [employer].

## 2024-08-07 NOTE — Assessment & Plan Note (Addendum)
 Onset 6months ago, productive, worse in supine position, associated with sore throat. Hiatal hernia noted on CXR per pulmonology on 08/06/2024 (pending final radiology report): ref to GI entered. She reports current use of of PPI prn, but does not remember name of med.  Advised to call office with med name, maintain appointment with GI when schedule, and elevate head of bed.

## 2024-08-07 NOTE — Progress Notes (Signed)
 SDW CALL  Patient was given pre-op  instructions over the phone. The opportunity was given for the patient to ask questions. No further questions asked. Patient verbalized understanding of instructions given.  Date & arrival time - August 09, 2024 @ 5:30  PCP - Nche, Roselie Rockford, NP  Cardiologist -   PPM/ICD - denies Device Orders - n/a Rep Notified - n/a  Chest x-ray - 08-06-24 EKG - DOS Stress Test - 08-24-12 ECHO - 07-21-17 Cardiac Cath -   Sleep Study - denies CPAP - n/a  Dm -denies  Blood Thinner Instructions:apixaban  (ELIQUIS )  08-06-24 per patient Aspirin  Instructions:denies  ERAS Protcol - NPO per patient Per patient she will not be taking any medication on day of procedure  COVID TEST- n/a   Anesthesia review: Hx of HTN, asthma. Patient reports of feeling better since last conversation. Patient reports she has a hiatal hernia. No cough or congestion noted during phone conversation.  Patient denies shortness of breath, fever, cough and chest pain over the phone call   All instructions explained to the patient, with a verbal understanding of the material. Patient agrees to go over the instructions while at home for a better understanding.

## 2024-08-07 NOTE — Assessment & Plan Note (Signed)
 Dialysis schedule MWF via right arm fistula at [facility] BP at goal No hx of DIABETES No LE edema Wt Readings from Last 3 Encounters:  08/07/24 175 lb 6.4 oz (79.6 kg)  08/06/24 175 lb (79.4 kg)  12/01/23 170 lb (77.1 kg)    Requested lab results from nephrology

## 2024-08-07 NOTE — Progress Notes (Addendum)
 Established Patient Visit  Patient: Victoria [name]   DOB: 04-Feb-1956   68 y.o. Female  MRN: [id number] Visit Date: 08/07/2024  Subjective:    Chief Complaint  Patient presents with   Follow-up    Follow up for HTN+   HPI Chronic cough Onset 6months ago, productive, worse in supine position, associated with sore throat. Hiatal hernia noted on CXR per pulmonology on 08/06/2024 (pending final radiology report): ref to GI entered. She reports current use of of PPI prn, but does not remember name of med.  Advised to call office with med name, maintain appointment with GI when schedule, and elevate head of bed.  ESRD on dialysis [facility]) Dialysis schedule MWF via right arm fistula at [facility] BP at goal No hx of DIABETES No LE edema Wt Readings from Last 3 Encounters:  08/07/24 175 lb 6.4 oz (79.6 kg)  08/06/24 175 lb (79.4 kg)  12/01/23 170 lb (77.1 kg)    Requested lab results from nephrology  Wt Readings from Last 3 Encounters:  08/07/24 175 lb 6.4 oz (79.6 kg)  08/06/24 175 lb (79.4 kg)  12/01/23 170 lb (77.1 kg)    Reviewed medical, surgical, and social history today  Medications: Outpatient Medications Prior to Visit  Medication Sig   albuterol  (VENTOLIN  HFA) 108 (90 Base) MCG/ACT inhaler Inhale 2 puffs into the lungs every 6 (six) hours as needed for wheezing or shortness of breath.   apixaban  (ELIQUIS ) 5 MG TABS tablet Take 1 tablet (5 mg total) by mouth 2 (two) times daily.   calcium  acetate (PHOSLO ) 667 MG capsule Take 1,334 mg by mouth 3 (three) times daily with meals.   ipratropium (ATROVENT) 0.03 % nasal spray Place 2 sprays into both nostrils every 12 (twelve) hours.   midodrine  (PROAMATINE ) 10 MG tablet Take 10 mg by mouth See admin instructions. Take 1 tablet by mouth three times a week as directed take one 30 minutes before starting dialysis and repeat x 1 halfway through tx.   montelukast (SINGULAIR) 10 MG tablet Take 1 tablet (10  mg total) by mouth at bedtime.   omeprazole  (PRILOSEC) 20 MG capsule Take 20 mg by mouth every morning.   No facility-administered medications prior to visit.   Reviewed past medical and social history.   ROS per HPI above      Objective:  BP 108/62 (BP Location: Left Arm, Patient Position: Sitting, Cuff Size: Large)   Pulse (!) 56   Temp 98.3 F (36.8 C) (Oral)   Ht 5' 4 (1.626 m)   Wt 175 lb 6.4 oz (79.6 kg)   LMP  (LMP Unknown)   SpO2 97%   BMI 30.11 kg/m      Physical Exam Vitals and nursing note reviewed.  Cardiovascular:     Rate and Rhythm: Normal rate and regular rhythm.     Pulses: Normal pulses.     Heart sounds: Normal heart sounds.  Pulmonary:     Effort: Pulmonary effort is normal.     Breath sounds: Normal breath sounds.  Musculoskeletal:     Right lower leg: No edema.     Left lower leg: No edema.  Neurological:     Mental Status: She is alert and oriented to person, place, and time.     No results found for any visits on 08/07/24.    Assessment & Plan:    Problem List Items Addressed  This Visit     Anemia in chronic kidney disease   Chronic cough   Onset 6months ago, productive, worse in supine position, associated with sore throat. Hiatal hernia noted on CXR per pulmonology on 08/06/2024 (pending final radiology report): ref to GI entered. She reports current use of of PPI prn, but does not remember name of med.  Advised to call office with med name, maintain appointment with GI when schedule, and elevate head of bed.      ESRD on dialysis [facility])   Dialysis schedule MWF via right arm fistula at [facility] BP at goal No hx of DIABETES No LE edema Wt Readings from Last 3 Encounters:  08/07/24 175 lb 6.4 oz (79.6 kg)  08/06/24 175 lb (79.4 kg)  12/01/23 170 lb (77.1 kg)    Requested lab results from nephrology      Hyperlipidemia   Other Visit Diagnoses       Colon cancer screening declined    -  Primary      Asymptomatic postmenopausal estrogen deficiency       Relevant Orders   DG Bone Density      Return in about 3 months (around 11/07/2024) for hypotension, hyperlipidemia (fasting).     Roselie Mood, NP

## 2024-08-07 NOTE — Telephone Encounter (Signed)
 Copied from CRM [id number]. Topic: Clinical - Medical Advice >> Aug 07, 2024  2:05 PM Brittany M wrote: Reason for CRM: Patient calling back to let Roselie know that the medication she was taking is omeprazole  (PRILOSEC) 20 MG capsule

## 2024-08-08 ENCOUNTER — Encounter (HOSPITAL_COMMUNITY): Payer: Self-pay | Admitting: Orthopedic Surgery

## 2024-08-08 NOTE — H&P (Signed)
 Orthopaedic Trauma Service (OTS) Consult   Patient ID: Victoria [name] MRN: [id number] DOB/AGE: 1955-10-16 68 y.o.    HPI: Victoria [name] is an 68 y.o. female history notable for ESRD on dialysis, thyroid  disease, hypertension who sustained a fracture to her left foot back in June.  She sustained left foot fifth metatarsal fracture.  She was evaluated in the office.  Initial films showed really no displacement and she opted to proceed with nonoperative management.  She has been followed very closely in the office and unfortunately has progressed to nonunion.  She presents today for repair of her left foot fifth metatarsal nonunion  Past Medical History:  Diagnosis Date   Arthritis    Asthma    Chronic bronchitis (HCC)    Complication of anesthesia ~ 2011   they gave me a medicine that swolled me and mouth burning up (08/23/2012)   Complication of anesthesia    slow to wake up   ESRD (end stage renal disease) on dialysis [facility]) Nephrologist-- dr deterding   ESRD due to HTN-; M/W/F; [address] (11/28/2015   GERD (gastroesophageal reflux disease)    no meds, diet controlled   History of acute pulmonary edema    2003   Hypertension    no longer on medications   Left patella fracture    Pneumonia    as a child   Seasonal allergies    Secondary hyperparathyroidism, renal    s/p  total parathyroidectomy  2014   Thyroid  disease    Wears glasses    Wound dehiscence, surgical 11/28/2015    Past Surgical History:  Procedure Laterality Date   A/V SHUNT INTERVENTION N/A 02/23/2024   Procedure: A/V SHUNT INTERVENTION;  Surgeon: Pearline Norman RAMAN, MD;  Location: [facility];  Service: Cardiovascular;  Laterality: N/A;   A/V SHUNT INTERVENTION N/A 02/24/2024   Procedure: A/V SHUNT INTERVENTION;  Surgeon: Serene Gaile ORN, MD;  Location: [facility];  Service: Cardiovascular;  Laterality: N/A;   ANKLE FRACTURE SURGERY Right ~ 2005   has pins in it   APPLICATION OF WOUND VAC  Left 11/28/2015   knee   APPLICATION OF WOUND VAC Left 11/28/2015   Procedure: APPLICATION OF WOUND VAC;  Surgeon: Redell Shoals, MD;  Location: [facility];  Service: Orthopedics;  Laterality: Left;   ARTERIOVENOUS GRAFT PLACEMENT  03/25/2005   Right forearm  w/  multiple Revision's    AV FISTULA PLACEMENT Right 04/13/2022   Procedure: INSERTION OF RIGHT ARTERIOVENOUS (AV) 4-21mm x 45cm GORE-TEX GRAFT;  Surgeon: Eliza Lonni RAMAN, MD;  Location: [facility] OR;  Service: Vascular;  Laterality: Right;   CARDIOVASCULAR STRESS TEST  08/24/2012   abnormal nuclear study/  inferolateral and anteroseptal areas of scar,  no ischemia/  normal LV function and wall motion, ef 77%   CATARACT EXTRACTION W/ INTRAOCULAR LENS IMPLANT Bilateral    cataract removal bilateral, lens implant in right eye   DIALYSIS FISTULA CREATION  09/20/1990   left upper arm ---  w/  Multiple Revision's until 2006   ECTOPIC PREGNANCY SURGERY  05/21/1969   FRACTURE SURGERY     HARDWARE REMOVAL Right 07/10/2020   Procedure: Removal of deep implant right fibula and tibia; steroid injection right ankle;  Surgeon: Kit Rush, MD;  Location: [facility];  Service: Orthopedics;  Laterality: Right;   I & D EXTREMITY Left 11/28/2015   Procedure: IRRIGATION AND DEBRIDEMENT LEFT KNEE WOUND ;  Surgeon: Redell Shoals, MD;  Location: [facility];  Service: Orthopedics;  Laterality: Left;   INCISION AND DRAINAGE OF WOUND Left 11/28/2015   knee   INSERTION OF DIALYSIS CATHETER Right 03/07/2023   Procedure: JERYL GUIDED INSERTION OF TUNNELED DIALYSIS CATHETER;  Surgeon: Magda Debby SAILOR, MD;  Location: [facility] OR;  Service: Vascular;  Laterality: Right;   IR FLUORO GUIDE CV LINE LEFT  02/09/2022   ORIF PATELLA Left 10/31/2015   Procedure: OPEN REDUCTION INTERNAL (ORIF) FIXATION LEFT PATELLA;  Surgeon: Redell Shoals, MD;  Location: [facility] [place];  Service: Orthopedics;  Laterality: Left;   PATELLAR TENDON REPAIR  10/03/2012   Procedure: PATELLA  TENDON REPAIR;  Surgeon: Franky CHRISTELLA Pointer, MD;  Location[facility];  Service: Orthopedics;  Laterality: Right;   PATELLECTOMY  10/03/2012   Procedure: PATELLECTOMY;  Surgeon: Franky CHRISTELLA Pointer, MD;  Location: [facility];  Service: Orthopedics;  Laterality: Right;  RIGHT PARTIAL PATELLECTOMY AND PATELLA TENDON REPAIR   PERIPHERAL VASCULAR THROMBECTOMY  02/24/2024   Procedure: PERIPHERAL VASCULAR THROMBECTOMY;  Surgeon: Serene Gaile ORN, MD;  Location: [facility];  Service: Cardiovascular;;   PERIPHERAL VASCULAR THROMBECTOMY Right 05/16/2024   Procedure: PERIPHERAL VASCULAR THROMBECTOMY;  Surgeon: Pearline Norman RAMAN, MD;  Location: [facility];  Service: Cardiovascular;  Laterality: Right;   PERIPHERAL VASCULAR THROMBECTOMY Right 06/08/2024   Procedure: PERIPHERAL VASCULAR THROMBECTOMY;  Surgeon: Magda Debby SAILOR, MD;  Location: [facility];  Service: Cardiovascular;  Laterality: Right;   REVISION OF ARTERIOVENOUS GORETEX GRAFT  09/27/2012   Procedure: REVISION OF ARTERIOVENOUS GORETEX GRAFT;  Surgeon: Lynwood JONETTA Collum, MD;  Location: [facility] OR;  Service: Vascular;  Laterality: Right;  1) Replacement of venous half of loop with 6mm Gortex graft  2) Excision of erroded pseudoaneurysm of graft with primary closure.   REVISION OF ARTERIOVENOUS GORETEX GRAFT Right 01/23/2013   Procedure: REVISION OF ARTERIOVENOUS GORETEX GRAFT;  Surgeon: Redell LITTIE Door, MD;  Location: [facility] OR;  Service: Vascular;  Laterality: Right;  Using piece of 6mm x 20cm Gortex graft.    REVISION OF ARTERIOVENOUS GORETEX GRAFT Right 10/07/2015   Procedure: REVISION OF Right arm ARTERIOVENOUS GORETEX GRAFT;  Surgeon: Carlin FORBES Haddock, MD;  Location: [facility] OR;  Service: Vascular;  Laterality: Right;   REVISION OF ARTERIOVENOUS GORETEX GRAFT Right 02/17/2016   Procedure: REVISION OF ARTERIOVENOUS GORETEX GRAFT;  Surgeon: Carlin FORBES Haddock, MD;  Location: [facility] OR;  Service: Vascular;  Laterality: Right;   REVISON OF ARTERIOVENOUS FISTULA Right 03/07/2023   Procedure:  REVISON OF ARTERIOVENOUS FISTULA ARM;  Surgeon: Magda Debby SAILOR, MD;  Location: [facility];  Service: Vascular;  Laterality: Right;   TOOTH EXTRACTION     10/2021   TOTAL ABDOMINAL HYSTERECTOMY  03/05/2005   w/  Right Ovarian Cystectomy   TOTAL PARATHYROIDECTOMY/  THYROID  ISTHMUSECTOMY/  AUTOTRANSPLANTATION PARATHYROID  TISSUE TO LEFT BRACHIORIADIALIS MUSCLE  02/18/2011   TRANSTHORACIC ECHOCARDIOGRAM  10/31/2012   mild LVH,  grade 1 diastolic dysfunction,  ef 55-65%/  mild MR/  trivial TR   VENOUS ANGIOPLASTY  02/23/2024   Procedure: VENOUS ANGIOPLASTY;  Surgeon: Pearline Norman RAMAN, MD;  Location: [facility];  Service: Cardiovascular;;   VENOUS STENT  02/24/2024   Procedure: VENOUS STENT;  Surgeon: Serene Gaile ORN, MD;  Location: [facility];  Service: Cardiovascular;;    Family History  Problem Relation Age of Onset   Healthy Mother    Hypertension Father    Kidney failure Father    Hypertension Sister    Thyroid  disease Sister  Social History:  reports that she has never smoked. She has never been exposed to tobacco smoke. She has never used smokeless tobacco. She reports that she does not currently use alcohol. She reports that she does not use drugs.  Allergies:  Allergies  Allergen Reactions   Amlodipine Swelling   Cefazolin      Pt received Ancef  on 10/21.2021 without issue   Cephalosporins Anaphylaxis    Pt received Ancef  on 07/10/2020 without issue   Lisinopril Swelling and Other (See Comments)    made my chest hurt    Medications: I have reviewed the patient's current medications. Current Outpatient Medications  Medication Instructions   albuterol  (VENTOLIN  HFA) 108 (90 Base) MCG/ACT inhaler 2 puffs, Inhalation, Every 6 hours PRN   calcium  acetate (PHOSLO ) 1,334 mg, 3 times daily with meals   Eliquis  5 mg, Oral, 2 times daily   ipratropium (ATROVENT) 0.03 % nasal spray 2 sprays, Each Nare, Every 12 hours   midodrine  (PROAMATINE ) 10 mg, See admin instructions    montelukast (SINGULAIR) 10 mg, Oral, Daily at bedtime   omeprazole  (PRILOSEC) 20 mg, Every morning     No results found for this or any previous visit (from the past 48 hours).  No results found.  Intake/Output    None      ROS As above Height 5' 3 (1.6 m), weight 77.1 kg. Physical Exam Gen: NAD, pleasant 68 y/o female Left foot  No significant swelling to left foot  She remains relatively nontender over her fifth metatarsal  Distal motor and sensory functions are grossly intact, mild peripheral neuropathy  + DP pulse  Assessment/Plan:  68 year old female with left foot fifth metatarsal nonunion  - Left foot fifth metatarsal nonunion  X-rays have showed progressive resorption at the fracture site with enlarging fracture gap.  Recommend repair of this nonunion.  Patient is agreeable  Anticipate outpatient surgery  Weightbearing status to be determined postoperatively    Francis MICAEL Mt, PA-C [phone] (C) 08/08/2024, 2:42 PM  [facility] [address] [place] [address] [phone] MAXIMINO MILLING (F)    After 5pm and on the weekends please log on to Amion, go to orthopaedics and the look under the [facility] Call for the provider(s) on call. You can also call our office at [phone] and then follow the prompts to be connected to the call team.

## 2024-08-08 NOTE — Progress Notes (Signed)
 Anesthesia Chart Review: SAME DAY WORK-UP  Case: [id number] Date/Time: 08/09/24 0745   Procedure: PINNING, EXTREMITY, PERCUTANEOUS (Left)   Anesthesia type: Choice   Diagnosis: Fracture of base of fifth metatarsal bone, left, with nonunion, subsequent encounter [S92.352K]   Pre-op  diagnosis: NONUNION LEFT FIFTH METATARSAL FRACTURE   [facility] / [facility]   Surgeons: Celena Sharper, MD       DISCUSSION: Patient is a 68 year old female scheduled for the above procedure.  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 Case was postponed from 07/25/2024 until she completed pulmonology evaluation for possible acute on chronic cough. Since then she had evaluation with pulmonologist Dr. Pleas and PCP Roselie Mood, NP (see below).  History includes never smoker, ESRD (HD MWF [facility]; RUE [facility]), HTN, asthma, chronic bronchitis, seasonal allergies, GERD, secondary hyperparathyroidism (s/p total parathyroidectomy/thyroid  isthmusectomy with autotransplantation of parathyroid  tissue to left brachioradialis muscle 02/18/2011), left bimalleolar ankle fracture (s/p ORIF 07/31/2008; removal of deep implant 07/10/2020), left patella fracture (s/p partial patellectomy with tendon repair 10/31/2015), uterine fibroids/endometriosis (TAH, right ovarian cystectomy), elevated left hemidiaphragm.   She had pulmonology evaluation by Dr. Pleas on 08/06/2024 for chronic cough with scratchy throat for about six months. She had previously had negative testing for flu, COVID, Strep. Symptoms worse when lying down. There could be associated mucous. She did not think Flonase was helping. Non-smoker. Denied wheezing and SOB. Large differential considered including chronic sinusitis with postnasal drip, cough variant asthma, GERD/hiatal hernia, pulmonary edema, habitual cough or lung cancer. CXR done and showed no mass or consolidation. She does have a chronically elevated left hemidiaphragm. BNP normal. IgE was  elevated at 418. My use empiric as needed albuterol . Will consider ICS maintenance inhaler if future PFTs suggest asthma. Five week follow-up planned. She was also referred to GI. Using PPI as needed. BP at goal at 08/07/2024 PCP visit.   She is a same day work-up. Anesthesia team to evaluate on the day of surgery.  When case previously scheduled PAT RN wrote, that patient may continue Eliquis  per Deward with ortho trauma.   VS: Ht 5' 3 (1.6 m)   Wt 77.1 kg   LMP  (LMP Unknown)   BMI 30.11 kg/m  BP Readings from Last 3 Encounters:  08/07/24 108/62  08/06/24 96/61  07/28/24 97/64   Pulse Readings from Last 3 Encounters:  08/07/24 (!) 56  08/06/24 79  07/28/24 79    PROVIDERS: Nche, Roselie Rockford, NP is PCP  Pleas, Dipti, MD is pulmonlogist - She is not routinely followed by cardiology but had evaluation in 2018 by Claudene Shove, MD for hypotension with prior stress test in 2013 suggesting old infarct, no ischemia. Echo updated which did not show a wall motion abnormality. As needed follow-up.    LABS: For day of surgery. HGB 11.6 on 08/01/2024 ([facility])   IMAGES: CXR 08/06/2024: FINDINGS: LINES, TUBES AND DEVICES: Right arm vascular stent noted. LUNGS AND PLEURA: Elevated left hemidiaphragm. No focal pulmonary opacity. No pleural effusion. No pneumothorax. HEART AND MEDIASTINUM: No acute abnormality of the cardiac and mediastinal silhouettes. VASCULATURE: Aortic atherosclerosis. BONES AND SOFT TISSUES: Surgical clips at thoracic inlet. No acute osseous abnormality. IMPRESSION: 1. No acute cardiopulmonary process.    EKG: Date of surgery as indicated.  Last EKG tracing noted is from 04/13/2022: Sinus rhythm with 1st degree A-V block Right bundle branch block Inferior infarct , age undetermined Possible Anterolateral infarct , age undetermined Abnormal ECG Confirmed by Verlin,  Lonni [phone]) on 04/13/2022 8:50:41 AM   CV: Echo 07/21/2017: Study Conclusions   - Left ventricle: The cavity size was normal. Wall thickness was    increased in a pattern of mild LVH. Systolic function was normal.    The estimated ejection fraction was in the range of 55% to 60%.    Doppler parameters are consistent with abnormal left ventricular    relaxation (grade 1 diastolic dysfunction).  - Atrial septum: No defect or patent foramen ovale was identified.   Nuclear stress test 08/24/2012: IMPRESSION:  1. Inferolateral and anteroseptal areas of likely scar.  2.  No findings for myocardial ischemia.  3.  Normal wall motion and calculated ejection fraction at 77%.    Past Medical History:  Diagnosis Date   Arthritis    Asthma    Chronic bronchitis (HCC)    Complication of anesthesia ~ 2011   they gave me a medicine that swolled me and mouth burning up (08/23/2012)   Complication of anesthesia    slow to wake up   ESRD (end stage renal disease) on dialysis [facility]) Nephrologist-- dr deterding   ESRD due to HTN-; M/W/F; [address] (11/28/2015   GERD (gastroesophageal reflux disease)    no meds, diet controlled   History of acute pulmonary edema    2003   Hypertension    no longer on medications   Left patella fracture    Pneumonia    as a child   Seasonal allergies    Secondary hyperparathyroidism, renal    s/p  total parathyroidectomy  2014   Thyroid  disease    Wears glasses    Wound dehiscence, surgical 11/28/2015    Past Surgical History:  Procedure Laterality Date   A/V SHUNT INTERVENTION N/A 02/23/2024   Procedure: A/V SHUNT INTERVENTION;  Surgeon: Pearline Norman RAMAN, MD;  Location: [facility];  Service: Cardiovascular;  Laterality: N/A;   A/V SHUNT INTERVENTION N/A 02/24/2024   Procedure: A/V SHUNT INTERVENTION;  Surgeon: Serene Gaile ORN, MD;  Location: [facility];  Service: Cardiovascular;  Laterality: N/A;   ANKLE FRACTURE SURGERY Right ~ 2005   has pins in it   APPLICATION OF WOUND VAC Left 11/28/2015   knee   APPLICATION OF WOUND VAC  Left 11/28/2015   Procedure: APPLICATION OF WOUND VAC;  Surgeon: Redell Shoals, MD;  [facility];  Service: Orthopedics;  Laterality: Left;   ARTERIOVENOUS GRAFT PLACEMENT  03/25/2005   Right forearm  w/  multiple Revision's    AV FISTULA PLACEMENT Right 04/13/2022   Procedure: INSERTION OF RIGHT ARTERIOVENOUS (AV) 4-41mm x 45cm GORE-TEX GRAFT;  Surgeon: Eliza Lonni RAMAN, MD;  Location: [facility] OR;  Service: Vascular;  Laterality: Right;   CARDIOVASCULAR STRESS TEST  08/24/2012   abnormal nuclear study/  inferolateral and anteroseptal areas of scar,  no ischemia/  normal LV function and wall motion, ef 77%   CATARACT EXTRACTION W/ INTRAOCULAR LENS IMPLANT Bilateral    cataract removal bilateral, lens implant in right eye   DIALYSIS FISTULA CREATION  09/20/1990   left upper arm ---  w/  Multiple Revision's until 2006   ECTOPIC PREGNANCY SURGERY  05/21/1969   FRACTURE SURGERY     HARDWARE REMOVAL Right 07/10/2020   Procedure: Removal of deep implant right fibula and tibia; steroid injection right ankle;  Surgeon: Kit Rush, MD;  [facility];  Service: Orthopedics;  Laterality: Right;   I & D EXTREMITY Left 11/28/2015   Procedure: IRRIGATION AND DEBRIDEMENT LEFT  KNEE WOUND ;  Surgeon: Redell Shoals, MD;  Location: [facility] OR;  Service: Orthopedics;  Laterality: Left;   INCISION AND DRAINAGE OF WOUND Left 11/28/2015   knee   INSERTION OF DIALYSIS CATHETER Right 03/07/2023   Procedure: JERYL GUIDED INSERTION OF TUNNELED DIALYSIS CATHETER;  Surgeon: Magda Debby SAILOR, MD;  Location: [facility] OR;  Service: Vascular;  Laterality: Right;   IR FLUORO GUIDE CV LINE LEFT  02/09/2022   ORIF PATELLA Left 10/31/2015   Procedure: OPEN REDUCTION INTERNAL (ORIF) FIXATION LEFT PATELLA;  Surgeon: Redell Shoals, MD;  Location: [facility];  Service: Orthopedics;  Laterality: Left;   PATELLAR TENDON REPAIR  10/03/2012   Procedure: PATELLA TENDON REPAIR;  Surgeon: Franky CHRISTELLA Pointer, MD;   [facility];  Service: Orthopedics;  Laterality: Right;   PATELLECTOMY  10/03/2012   Procedure: PATELLECTOMY;  Surgeon: Franky CHRISTELLA Pointer, MD;  [facility];  Service: Orthopedics;  Laterality: Right;  RIGHT PARTIAL PATELLECTOMY AND PATELLA TENDON REPAIR   PERIPHERAL VASCULAR THROMBECTOMY  02/24/2024   Procedure: PERIPHERAL VASCULAR THROMBECTOMY;  Surgeon: Serene Gaile ORN, MD;  Location: [facility];  Service: Cardiovascular;;   PERIPHERAL VASCULAR THROMBECTOMY Right 05/16/2024   Procedure: PERIPHERAL VASCULAR THROMBECTOMY;  Surgeon: Pearline Norman RAMAN, MD;  Location: [facility];  Service: Cardiovascular;  Laterality: Right;   PERIPHERAL VASCULAR THROMBECTOMY Right 06/08/2024   Procedure: PERIPHERAL VASCULAR THROMBECTOMY;  Surgeon: Magda Debby SAILOR, MD;  Location: [facility];  Service: Cardiovascular;  Laterality: Right;   REVISION OF ARTERIOVENOUS GORETEX GRAFT  09/27/2012   Procedure: REVISION OF ARTERIOVENOUS GORETEX GRAFT;  Surgeon: Lynwood JONETTA Collum, MD;  Location: [facility] OR;  Service: Vascular;  Laterality: Right;  1) Replacement of venous half of loop with 6mm Gortex graft  2) Excision of erroded pseudoaneurysm of graft with primary closure.   REVISION OF ARTERIOVENOUS GORETEX GRAFT Right 01/23/2013   Procedure: REVISION OF ARTERIOVENOUS GORETEX GRAFT;  Surgeon: Redell LITTIE Door, MD;  Location: [facility] OR;  Service: Vascular;  Laterality: Right;  Using piece of 6mm x 20cm Gortex graft.    REVISION OF ARTERIOVENOUS GORETEX GRAFT Right 10/07/2015   Procedure: REVISION OF Right arm ARTERIOVENOUS GORETEX GRAFT;  Surgeon: Carlin FORBES Haddock, MD;  Location: [facility] OR;  Service: Vascular;  Laterality: Right;   REVISION OF ARTERIOVENOUS GORETEX GRAFT Right 02/17/2016   Procedure: REVISION OF ARTERIOVENOUS GORETEX GRAFT;  Surgeon: Carlin FORBES Haddock, MD;  Location: [facility] OR;  Service: Vascular;  Laterality: Right;   REVISON OF ARTERIOVENOUS FISTULA Right 03/07/2023   Procedure: REVISON OF ARTERIOVENOUS FISTULA ARM;  Surgeon:  Magda Debby SAILOR, MD;  [facility];  Service: Vascular;  Laterality: Right;   TOOTH EXTRACTION     10/2021   TOTAL ABDOMINAL HYSTERECTOMY  03/05/2005   w/  Right Ovarian Cystectomy   TOTAL PARATHYROIDECTOMY/  THYROID  ISTHMUSECTOMY/  AUTOTRANSPLANTATION PARATHYROID  TISSUE TO LEFT BRACHIORIADIALIS MUSCLE  02/18/2011   TRANSTHORACIC ECHOCARDIOGRAM  10/31/2012   mild LVH,  grade 1 diastolic dysfunction,  ef 55-65%/  mild MR/  trivial TR   VENOUS ANGIOPLASTY  02/23/2024   Procedure: VENOUS ANGIOPLASTY;  Surgeon: Pearline Norman RAMAN, MD;  Location: [facility];  Service: Cardiovascular;;   VENOUS STENT  02/24/2024   Procedure: VENOUS STENT;  Surgeon: Serene Gaile ORN, MD;  Location: [facility];  Service: Cardiovascular;;    MEDICATIONS: No current facility-administered medications for this encounter.    apixaban  (ELIQUIS ) 5 MG TABS tablet   calcium  acetate (PHOSLO ) 667 MG capsule  omeprazole  (PRILOSEC) 20 MG capsule   albuterol  (VENTOLIN  HFA) 108 (90 Base) MCG/ACT inhaler   ipratropium (ATROVENT) 0.03 % nasal spray   midodrine  (PROAMATINE ) 10 MG tablet   montelukast (SINGULAIR) 10 MG tablet    Isaiah Ruder, PA-C Surgical Short Stay/Anesthesiology [facility] Phone [phone] [facility] Phone [phone] 08/08/2024 3:37 PM

## 2024-08-08 NOTE — Anesthesia Preprocedure Evaluation (Signed)
 Anesthesia Evaluation  Patient identified by MRN, date of birth, ID band Patient awake    Reviewed: Allergy & Precautions, NPO status , Patient's Chart, lab work & pertinent test results  History of Anesthesia Complications Negative for: history of anesthetic complications  Airway Mallampati: II  TM Distance: >3 FB Neck ROM: Full    Dental  (+) Teeth Intact, Dental Advisory Given   Pulmonary asthma    breath sounds clear to auscultation       Cardiovascular hypertension, + dysrhythmias (RBBB)  Rhythm:Regular Rate:Normal     Neuro/Psych Left Hemidiaphragm    GI/Hepatic ,GERD  Medicated and Controlled,,  Endo/Other  S/p Parathyroidectomy  Renal/GU ESRF and DialysisRenal disease     Musculoskeletal   Abdominal   Peds  Hematology  (+) Blood dyscrasia, anemia   Anesthesia Other Findings   Reproductive/Obstetrics                              Anesthesia Physical Anesthesia Plan  ASA: 3  Anesthesia Plan: General   Post-op Pain Management:    Induction: Intravenous  PONV Risk Score and Plan: 2 and Ondansetron , Dexamethasone  and Treatment may vary due to age or medical condition  Airway Management Planned: LMA  Additional Equipment:   Intra-op Plan:   Post-operative Plan: Extubation in OR  Informed Consent:      Dental advisory given  Plan Discussed with: CRNA  Anesthesia Plan Comments: (PAT note written 08/08/2024 by Etna Forquer, PA-C.  )         Anesthesia Quick Evaluation

## 2024-08-08 NOTE — Telephone Encounter (Signed)
 Patient called and informed of Charlotte's comments/recommendations to take omeprazole  20mg  BID, while waiting for appointment with GI. Patient verbalized understanding and all (if any) questions were answered.

## 2024-08-09 ENCOUNTER — Encounter (HOSPITAL_COMMUNITY): Admission: RE | Disposition: A | Payer: Self-pay | Source: Home / Self Care | Attending: Orthopedic Surgery

## 2024-08-09 ENCOUNTER — Ambulatory Visit (HOSPITAL_COMMUNITY)

## 2024-08-09 ENCOUNTER — Ambulatory Visit (HOSPITAL_COMMUNITY)
Admission: RE | Admit: 2024-08-09 | Discharge: 2024-08-09 | Disposition: A | Discharge status: Home / Self Care | Attending: Orthopedic Surgery | Admitting: Orthopedic Surgery

## 2024-08-09 ENCOUNTER — Ambulatory Visit (HOSPITAL_COMMUNITY): Payer: Self-pay | Admitting: Vascular Surgery

## 2024-08-09 ENCOUNTER — Other Ambulatory Visit (HOSPITAL_COMMUNITY): Payer: Self-pay

## 2024-08-09 DIAGNOSIS — K219 Gastro-esophageal reflux disease without esophagitis: Secondary | ICD-10-CM | POA: Diagnosis not present

## 2024-08-09 DIAGNOSIS — Z7901 Long term (current) use of anticoagulants: Secondary | ICD-10-CM | POA: Insufficient documentation

## 2024-08-09 DIAGNOSIS — I1311 Hypertensive heart and chronic kidney disease without heart failure, with stage 5 chronic kidney disease, or end stage renal disease: Secondary | ICD-10-CM | POA: Insufficient documentation

## 2024-08-09 DIAGNOSIS — X58XXXD Exposure to other specified factors, subsequent encounter: Secondary | ICD-10-CM | POA: Diagnosis not present

## 2024-08-09 DIAGNOSIS — I120 Hypertensive chronic kidney disease with stage 5 chronic kidney disease or end stage renal disease: Secondary | ICD-10-CM

## 2024-08-09 DIAGNOSIS — N2581 Secondary hyperparathyroidism of renal origin: Secondary | ICD-10-CM | POA: Insufficient documentation

## 2024-08-09 DIAGNOSIS — J45909 Unspecified asthma, uncomplicated: Secondary | ICD-10-CM | POA: Diagnosis not present

## 2024-08-09 DIAGNOSIS — D631 Anemia in chronic kidney disease: Secondary | ICD-10-CM | POA: Diagnosis not present

## 2024-08-09 DIAGNOSIS — S92352K Displaced fracture of fifth metatarsal bone, left foot, subsequent encounter for fracture with nonunion: Secondary | ICD-10-CM | POA: Diagnosis not present

## 2024-08-09 DIAGNOSIS — N186 End stage renal disease: Secondary | ICD-10-CM

## 2024-08-09 HISTORY — DX: Unspecified asthma, uncomplicated: J45.909

## 2024-08-09 HISTORY — DX: Disorders of diaphragm: J98.6

## 2024-08-09 HISTORY — PX: PERCUTANEOUS PINNING: SHX2209

## 2024-08-09 LAB — POCT I-STAT, CHEM 8
BUN: 27 mg/dL — ABNORMAL HIGH (ref 8–23)
Calcium, Ion: 0.97 mmol/L — ABNORMAL LOW (ref 1.15–1.40)
Chloride: 102 mmol/L (ref 98–111)
Creatinine, Ser: 7 mg/dL — ABNORMAL HIGH (ref 0.44–1.00)
Glucose, Bld: 85 mg/dL (ref 70–99)
HCT: 39 % (ref 36.0–46.0)
Hemoglobin: 13.3 g/dL (ref 12.0–15.0)
Potassium: 4.4 mmol/L (ref 3.5–5.1)
Sodium: 138 mmol/L (ref 135–145)
TCO2: 29 mmol/L (ref 22–32)

## 2024-08-09 SURGERY — PINNING, EXTREMITY, PERCUTANEOUS
Anesthesia: General | Site: Fifth Toe | Laterality: Left

## 2024-08-09 MED ORDER — EPHEDRINE 5 MG/ML INJ
INTRAVENOUS | Status: AC
  Filled 2024-08-09: qty 5

## 2024-08-09 MED ORDER — PROPOFOL 10 MG/ML IV BOLUS
INTRAVENOUS | Status: DC | PRN
  Administered 2024-08-09: 90 mg via INTRAVENOUS

## 2024-08-09 MED ORDER — PROPOFOL 10 MG/ML IV BOLUS
INTRAVENOUS | Status: AC
Start: 2024-08-09 — End: 2024-08-09
  Filled 2024-08-09: qty 20

## 2024-08-09 MED ORDER — ROCURONIUM BROMIDE 10 MG/ML (PF) SYRINGE
PREFILLED_SYRINGE | INTRAVENOUS | Status: AC
  Filled 2024-08-09: qty 10

## 2024-08-09 MED ORDER — CHLORHEXIDINE GLUCONATE 0.12 % MT SOLN
OROMUCOSAL | Status: AC
  Administered 2024-08-09: 15 mL via OROMUCOSAL
  Filled 2024-08-09: qty 15

## 2024-08-09 MED ORDER — OXYCODONE HCL 5 MG PO TABS
5.0000 mg | ORAL_TABLET | Freq: Four times a day (QID) | ORAL | 0 refills | Status: AC | PRN
  Filled 2024-08-09: qty 28, 7d supply, fill #0

## 2024-08-09 MED ORDER — DEXAMETHASONE SOD PHOSPHATE PF 10 MG/ML IJ SOLN
INTRAMUSCULAR | Status: DC | PRN
  Administered 2024-08-09: 5 mg via INTRAVENOUS

## 2024-08-09 MED ORDER — CEFAZOLIN SODIUM-DEXTROSE 2-4 GM/100ML-% IV SOLN
INTRAVENOUS | Status: AC
Start: 2024-08-09 — End: 2024-08-09
  Filled 2024-08-09: qty 100

## 2024-08-09 MED ORDER — LIDOCAINE 2% (20 MG/ML) 5 ML SYRINGE
INTRAMUSCULAR | Status: DC | PRN
  Administered 2024-08-09: 100 mg via INTRAVENOUS

## 2024-08-09 MED ORDER — FENTANYL CITRATE (PF) 250 MCG/5ML IJ SOLN
INTRAMUSCULAR | Status: AC
  Filled 2024-08-09: qty 5

## 2024-08-09 MED ORDER — CEFAZOLIN SODIUM-DEXTROSE 2-4 GM/100ML-% IV SOLN
2.0000 g | INTRAVENOUS | Status: AC
  Administered 2024-08-09: 2 g via INTRAVENOUS

## 2024-08-09 MED ORDER — FENTANYL CITRATE (PF) 250 MCG/5ML IJ SOLN
INTRAMUSCULAR | Status: DC | PRN
Start: 2024-08-09 — End: 2024-08-09
  Administered 2024-08-09: 25 ug via INTRAVENOUS
  Administered 2024-08-09: 100 ug via INTRAVENOUS

## 2024-08-09 MED ORDER — FENTANYL CITRATE (PF) 100 MCG/2ML IJ SOLN
25.0000 ug | INTRAMUSCULAR | Status: DC | PRN
  Administered 2024-08-09 (×2): 25 ug via INTRAVENOUS

## 2024-08-09 MED ORDER — 0.9 % SODIUM CHLORIDE (POUR BTL) OPTIME
TOPICAL | Status: DC | PRN
  Administered 2024-08-09: 1000 mL

## 2024-08-09 MED ORDER — AMISULPRIDE (ANTIEMETIC) 5 MG/2ML IV SOLN: 10.0000 mg | Freq: Once | INTRAVENOUS | Status: DC | PRN

## 2024-08-09 MED ORDER — ACETAMINOPHEN 10 MG/ML IV SOLN
1000.0000 mg | Freq: Once | INTRAVENOUS | Status: DC | PRN
  Administered 2024-08-09: 1000 mg via INTRAVENOUS

## 2024-08-09 MED ORDER — ROCURONIUM BROMIDE 10 MG/ML (PF) SYRINGE
PREFILLED_SYRINGE | INTRAVENOUS | Status: DC | PRN
  Administered 2024-08-09: 60 mg via INTRAVENOUS

## 2024-08-09 MED ORDER — SODIUM CHLORIDE 0.9 % IV SOLN: INTRAVENOUS | Status: DC | PRN

## 2024-08-09 MED ORDER — CYCLOBENZAPRINE HCL 5 MG PO TABS
5.0000 mg | ORAL_TABLET | Freq: Three times a day (TID) | ORAL | 0 refills | Status: AC | PRN
  Filled 2024-08-09: qty 30, 10d supply, fill #0

## 2024-08-09 MED ORDER — ONDANSETRON HCL 4 MG/2ML IJ SOLN: 4.0000 mg | Freq: Once | INTRAMUSCULAR | Status: DC | PRN

## 2024-08-09 MED ORDER — OXYCODONE HCL 5 MG PO TABS
5.0000 mg | ORAL_TABLET | Freq: Once | ORAL | Status: AC | PRN
  Administered 2024-08-09: 5 mg via ORAL

## 2024-08-09 MED ORDER — OXYCODONE HCL 5 MG/5ML PO SOLN: 5.0000 mg | Freq: Once | ORAL | Status: AC | PRN

## 2024-08-09 MED ORDER — ORAL CARE MOUTH RINSE: 15.0000 mL | Freq: Once | OROMUCOSAL | Status: AC

## 2024-08-09 MED ORDER — FENTANYL CITRATE (PF) 100 MCG/2ML IJ SOLN
INTRAMUSCULAR | Status: AC
  Filled 2024-08-09: qty 2

## 2024-08-09 MED ORDER — IPRATROPIUM-ALBUTEROL 0.5-2.5 (3) MG/3ML IN SOLN
3.0000 mL | RESPIRATORY_TRACT | Status: DC
  Administered 2024-08-09: 3 mL via RESPIRATORY_TRACT

## 2024-08-09 MED ORDER — PHENYLEPHRINE 80 MCG/ML (10ML) SYRINGE FOR IV PUSH (FOR BLOOD PRESSURE SUPPORT)
PREFILLED_SYRINGE | INTRAVENOUS | Status: DC | PRN
  Administered 2024-08-09 (×2): 80 ug via INTRAVENOUS
  Administered 2024-08-09 (×2): 160 ug via INTRAVENOUS
  Administered 2024-08-09: 120 ug via INTRAVENOUS
  Administered 2024-08-09: 200 ug via INTRAVENOUS
  Administered 2024-08-09: 120 ug via INTRAVENOUS

## 2024-08-09 MED ORDER — MIDAZOLAM HCL 2 MG/2ML IJ SOLN
INTRAMUSCULAR | Status: AC
  Filled 2024-08-09: qty 2

## 2024-08-09 MED ORDER — LIDOCAINE 2% (20 MG/ML) 5 ML SYRINGE
INTRAMUSCULAR | Status: AC
  Filled 2024-08-09: qty 5

## 2024-08-09 MED ORDER — SUGAMMADEX SODIUM 200 MG/2ML IV SOLN
INTRAVENOUS | Status: DC | PRN
  Administered 2024-08-09: 200 mg via INTRAVENOUS
  Administered 2024-08-09 (×2): 100 mg via INTRAVENOUS

## 2024-08-09 MED ORDER — PHENYLEPHRINE 80 MCG/ML (10ML) SYRINGE FOR IV PUSH (FOR BLOOD PRESSURE SUPPORT)
PREFILLED_SYRINGE | INTRAVENOUS | Status: AC
  Filled 2024-08-09: qty 10

## 2024-08-09 MED ORDER — IPRATROPIUM-ALBUTEROL 0.5-2.5 (3) MG/3ML IN SOLN
RESPIRATORY_TRACT | Status: AC
  Filled 2024-08-09: qty 3

## 2024-08-09 MED ORDER — ACETAMINOPHEN 325 MG PO TABS
650.0000 mg | ORAL_TABLET | Freq: Four times a day (QID) | ORAL | 0 refills | Status: AC | PRN
  Filled 2024-08-09: qty 50, 7d supply, fill #0

## 2024-08-09 MED ORDER — ACETAMINOPHEN 10 MG/ML IV SOLN
INTRAVENOUS | Status: AC
  Filled 2024-08-09: qty 100

## 2024-08-09 MED ORDER — FENTANYL CITRATE (PF) 100 MCG/2ML IJ SOLN: 25.0000 ug | INTRAMUSCULAR | Status: DC | PRN

## 2024-08-09 MED ORDER — ONDANSETRON HCL 4 MG/2ML IJ SOLN
INTRAMUSCULAR | Status: AC
  Filled 2024-08-09: qty 2

## 2024-08-09 MED ORDER — OXYCODONE HCL 5 MG PO TABS
ORAL_TABLET | ORAL | Status: AC
  Filled 2024-08-09: qty 1

## 2024-08-09 MED ORDER — CHLORHEXIDINE GLUCONATE 0.12 % MT SOLN: 15.0000 mL | Freq: Once | OROMUCOSAL | Status: AC

## 2024-08-09 MED ORDER — ONDANSETRON HCL 4 MG/2ML IJ SOLN
INTRAMUSCULAR | Status: DC | PRN
  Administered 2024-08-09: 4 mg via INTRAVENOUS

## 2024-08-09 SURGICAL SUPPLY — 45 items
BAG COUNTER SPONGE SURGICOUNT (BAG) ×2 IMPLANT
BENZOIN TINCTURE PRP APPL 2/3 (GAUZE/BANDAGES/DRESSINGS) IMPLANT
BIT DRILL CANN 3/16 3.8X180 (DRILL) IMPLANT
BLADE CLIPPER SURG (BLADE) IMPLANT
BNDG ELASTIC 3INX 5YD STR LF (GAUZE/BANDAGES/DRESSINGS) ×2 IMPLANT
BNDG ELASTIC 4X5.8 VLCR STR LF (GAUZE/BANDAGES/DRESSINGS) IMPLANT
BNDG GAUZE DERMACEA FLUFF 4 (GAUZE/BANDAGES/DRESSINGS) ×2 IMPLANT
BRUSH SCRUB EZ 4% CHG (MISCELLANEOUS) ×2 IMPLANT
BRUSH SCRUB EZ PLAIN DRY (MISCELLANEOUS) ×4 IMPLANT
CANISTER SUCTION 3000ML PPV (SUCTIONS) IMPLANT
COVER SURGICAL LIGHT HANDLE (MISCELLANEOUS) ×4 IMPLANT
CUFF TOURN SGL QUICK 18X4 (TOURNIQUET CUFF) IMPLANT
CUFF TRNQT CYL 24X4X16.5-23 (TOURNIQUET CUFF) IMPLANT
DRAPE C-ARM 42X120 X-RAY (DRAPES) IMPLANT
DRAPE C-ARMOR (DRAPES) ×2 IMPLANT
DRAPE U-SHAPE 47X51 STRL (DRAPES) IMPLANT
DRESSING MEPILEX FLEX 4X4 (GAUZE/BANDAGES/DRESSINGS) IMPLANT
DRSG EMULSION OIL 3X3 NADH (GAUZE/BANDAGES/DRESSINGS) IMPLANT
GAUZE SPONGE 4X4 12PLY STRL (GAUZE/BANDAGES/DRESSINGS) IMPLANT
GAUZE XEROFORM 1X8 LF (GAUZE/BANDAGES/DRESSINGS) IMPLANT
GLOVE BIO SURGEON STRL SZ8 (GLOVE) ×4 IMPLANT
GLOVE BIOGEL PI IND STRL 7.5 (GLOVE) ×2 IMPLANT
GLOVE BIOGEL PI IND STRL 8 (GLOVE) ×2 IMPLANT
GLOVE SURG ORTHO LTX SZ7.5 (GLOVE) ×4 IMPLANT
GOWN STRL REUS W/ TWL LRG LVL3 (GOWN DISPOSABLE) ×4 IMPLANT
GOWN STRL REUS W/ TWL XL LVL3 (GOWN DISPOSABLE) ×2 IMPLANT
KIT BASIN OR (CUSTOM PROCEDURE TRAY) ×2 IMPLANT
KIT TURNOVER KIT B (KITS) ×2 IMPLANT
MANIFOLD NEPTUNE II (INSTRUMENTS) ×2 IMPLANT
PACK ORTHO EXTREMITY (CUSTOM PROCEDURE TRAY) ×2 IMPLANT
PAD ARMBOARD POSITIONER FOAM (MISCELLANEOUS) ×4 IMPLANT
PADDING CAST SYNTHETIC 4X4 STR (CAST SUPPLIES) IMPLANT
SCREW CANN MONSTER BT 5.5X54 (Screw) IMPLANT
SOLN 0.9% NACL POUR BTL 1000ML (IV SOLUTION) ×2 IMPLANT
SOLN STERILE WATER BTL 1000 ML (IV SOLUTION) ×2 IMPLANT
STRIP CLOSURE SKIN 1/2X4 (GAUZE/BANDAGES/DRESSINGS) IMPLANT
SUCTION TUBE FRAZIER 10FR DISP (SUCTIONS) IMPLANT
SUT ETHILON 2 0 PSLX (SUTURE) IMPLANT
SUT NYLON ETHILON 5-0 P-3 1X18 (SUTURE) IMPLANT
SUT PROLENE 4 0 P 3 18 (SUTURE) IMPLANT
TAP 5.5 (TAP) IMPLANT
TOWEL GREEN STERILE (TOWEL DISPOSABLE) ×4 IMPLANT
TOWEL GREEN STERILE FF (TOWEL DISPOSABLE) ×2 IMPLANT
TUBE CONNECTING 20X1/4 (TUBING) IMPLANT
WIRE 1.5 NITINOL (MISCELLANEOUS) IMPLANT

## 2024-08-09 NOTE — Discharge Instructions (Addendum)
 Orthopaedic Trauma Service Discharge Instructions   General Discharge Instructions  Orthopaedic Injuries:  Left fifth metatarsal nonunion treated with open reduction and internal fixation using screw  WEIGHT BEARING STATUS: Nonweightbearing left leg for 6 to 8 weeks.  Use walker to mobilize  RANGE OF MOTION/ACTIVITY: Genna to come out of your boot periodically for ankle motion.  Activity as tolerated maintaining weightbearing restrictions noted above  Bone health:   Review the following resource for additional information regarding bone health  [email or web address].cy  Wound Care: Daily wound care starting on 08/11/2024.  Please see below   Discharge Wound Care Instructions  Do NOT apply any ointments, solutions or lotions to pin sites or surgical wounds.  These prevent needed drainage and even though solutions like hydrogen peroxide kill bacteria, they also damage cells lining the pin sites that help fight infection.  Applying lotions or ointments can keep the wounds moist and can cause them to breakdown and open up as well. This can increase the risk for infection. When in doubt call the office.  Surgical incisions should be dressed daily.  If any drainage is noted, use one layer of adaptic or Mepitel, then gauze, Kerlix, and an ace wrap.  [email or web address] [email or web address]_rd_[email or web address]&[email or web address]  [email or web address].[email or web address]/  These dressing supplies should be available at local medical supply stores ([employer], [employer], etc). They are not usually carried at places like [facility], [facility], [employer], etc  Once the incision is completely dry and without drainage, it may be left open to air out.  Showering may begin 36-48 hours later.  Cleaning gently with soap and  water.  Traumatic wounds should be dressed daily as well.    One layer of adaptic, gauze, Kerlix, then ace wrap.  The adaptic can be discontinued once the draining has ceased    If you have a wet to dry dressing: wet the gauze with saline the squeeze as much saline out so the gauze is moist (not soaking wet), place moistened gauze over wound, then place a dry gauze over the moist one, followed by Kerlix wrap, then ace wrap.  DVT/PE prophylaxis: Resume home Eliquis   Diet: as you were eating previously.  Can use over the counter stool softeners and bowel preparations, such as Miralax, to help with bowel movements.  Narcotics can be constipating.  Be sure to drink plenty of fluids  PAIN MEDICATION USE AND EXPECTATIONS  You have likely been given narcotic medications to help control your pain.  After a traumatic event that results in an fracture (broken bone) with or without surgery, it is ok to use narcotic pain medications to help control one's pain.  We understand that everyone responds to pain differently and each individual patient will be evaluated on a regular basis for the continued need for narcotic medications. Ideally, narcotic medication use should last no more than 6-8 weeks (coinciding with fracture healing).   As a patient it is your responsibility as well to monitor narcotic medication use and report the amount and frequency you use these medications when you come to your office visit.   We would also advise that if you are using narcotic medications, you should take a dose prior to therapy to maximize you participation.  IF YOU ARE ON NARCOTIC MEDICATIONS IT IS NOT PERMISSIBLE TO OPERATE A MOTOR VEHICLE (MOTORCYCLE/CAR/TRUCK/MOPED) OR HEAVY MACHINERY DO NOT MIX NARCOTICS WITH OTHER CNS (CENTRAL NERVOUS SYSTEM) DEPRESSANTS SUCH AS ALCOHOL   POST-OPERATIVE OPIOID TAPER INSTRUCTIONS: It is important to wean off of your opioid medication  as soon as possible. If you do not need pain  medication after your surgery it is ok to stop day one. Opioids include: Codeine, Hydrocodone (Norco, Vicodin), Oxycodone (Percocet, oxycontin ) and hydromorphone  amongst others.  Long term and even short term use of opiods can cause: Increased pain response Dependence Constipation Depression Respiratory depression And more.  Withdrawal symptoms can include Flu like symptoms Nausea, vomiting And more Techniques to manage these symptoms Hydrate well Eat regular healthy meals Stay active Use relaxation techniques(deep breathing, meditating, yoga) Do Not substitute Alcohol to help with tapering If you have been on opioids for less than two weeks and do not have pain than it is ok to stop all together.  Plan to wean off of opioids This plan should start within one week post op of your fracture surgery  Maintain the same interval or time between taking each dose and first decrease the dose.  Cut the total daily intake of opioids by one tablet each day Next start to increase the time between doses. The last dose that should be eliminated is the evening dose.    STOP SMOKING OR USING NICOTINE PRODUCTS!!!!  As discussed nicotine severely impairs your body's ability to heal surgical and traumatic wounds but also impairs bone healing.  Wounds and bone heal by forming microscopic blood vessels (angiogenesis) and nicotine is a vasoconstrictor (essentially, shrinks blood vessels).  Therefore, if vasoconstriction occurs to these microscopic blood vessels they essentially disappear and are unable to deliver necessary nutrients to the healing tissue.  This is one modifiable factor that you can do to dramatically increase your chances of healing your injury.    (This means no smoking, no nicotine gum, patches, etc)  DO NOT USE NONSTEROIDAL ANTI-INFLAMMATORY DRUGS (NSAID'S)  Using products such as Advil  (ibuprofen ), Aleve (naproxen), Motrin  (ibuprofen ) for additional pain control during fracture healing  can delay and/or prevent the healing response.  If you would like to take over the counter (OTC) medication, Tylenol  (acetaminophen ) is ok.  However, some narcotic medications that are given for pain control contain acetaminophen  as well. Therefore, you should not exceed more than 4000 mg of tylenol  in a day if you do not have liver disease.  Also note that there are may OTC medicines, such as cold medicines and allergy medicines that my contain tylenol  as well.  If you have any questions about medications and/or interactions please ask your doctor/PA or your pharmacist.      ICE AND ELEVATE INJURED/OPERATIVE EXTREMITY  Using ice and elevating the injured extremity above your heart can help with swelling and pain control.  Icing in a pulsatile fashion, such as 20 minutes on and 20 minutes off, can be followed.    Do not place ice directly on skin. Make sure there is a barrier between to skin and the ice pack.    Using frozen items such as frozen peas works well as the conform nicely to the are that needs to be iced.  USE AN ACE WRAP OR TED HOSE FOR SWELLING CONTROL  In addition to icing and elevation, Ace wraps or TED hose are used to help limit and resolve swelling.  It is recommended to use Ace wraps or TED hose until you are informed to stop.    When using Ace Wraps start the wrapping distally (farthest away from the body) and wrap proximally (closer to the body)   Example: If you had surgery on your leg and you do not have a splint on, start the ace  wrap at the toes and work your way up to the thigh        If you had surgery on your upper extremity and do not have a splint on, start the ace wrap at your fingers and work your way up to the upper arm  IF YOU ARE IN A SPLINT OR CAST DO NOT REMOVE IT FOR ANY REASON   If your splint gets wet for any reason please contact the office immediately. You may shower in your splint or cast as long as you keep it dry.  This can be done by wrapping in a cast  cover or garbage back (or similar)  Do Not stick any thing down your splint or cast such as pencils, money, or hangers to try and scratch yourself with.  If you feel itchy take benadryl  as prescribed on the bottle for itching  IF YOU ARE IN A CAM BOOT (BLACK BOOT)  You may remove boot periodically. Perform daily dressing changes as noted below.  Wash the liner of the boot regularly and wear a sock when wearing the boot. It is recommended that you sleep in the boot until told otherwise    Call office for the following: Temperature greater than 101F Persistent nausea and vomiting Severe uncontrolled pain Redness, tenderness, or signs of infection (pain, swelling, redness, odor or green/yellow discharge around the site) Difficulty breathing, headache or visual disturbances Hives Persistent dizziness or light-headedness Extreme fatigue Any other questions or concerns you may have after discharge  In an emergency, call 911 or go to an Emergency Department at a nearby hospital  HELPFUL INFORMATION  If you had a block, it will wear off between 8-24 hrs postop typically.  This is period when your pain may go from nearly zero to the pain you would have had postop without the block.  This is an abrupt transition but nothing dangerous is happening.  You may take an extra dose of narcotic when this happens.  You should wean off your narcotic medicines as soon as you are able.  Most patients will be off or using minimal narcotics before their first postop appointment.   We suggest you use the pain medication the first night prior to going to bed, in order to ease any pain when the anesthesia wears off. You should avoid taking pain medications on an empty stomach as it will make you nauseous.  Do not drink alcoholic beverages or take illicit drugs when taking pain medications.  In most states it is against the law to drive while you are in a splint or sling.  And certainly against the law to drive  while taking narcotics.  You may return to work/school in the next couple of days when you feel up to it.   Pain medication may make you constipated.  Below are a few solutions to try in this order: Decrease the amount of pain medication if you aren't having pain. Drink lots of decaffeinated fluids. Drink prune juice and/or each dried prunes  If the first 3 don't work start with additional solutions Take Colace - an over-the-counter stool softener Take Senokot - an over-the-counter laxative Take Miralax - a stronger over-the-counter laxative     CALL THE OFFICE WITH ANY QUESTIONS OR CONCERNS: [phone]   VISIT OUR WEBSITE FOR ADDITIONAL INFORMATION: [email or web address]

## 2024-08-09 NOTE — Evaluation (Signed)
 Physical Therapy Evaluation Patient Details Name: Victoria [name] MRN: [id number] DOB: 1955/12/03 Today's Date: 08/09/2024  History of Present Illness  Patient is a 68 y/o female seen for L foot fifth metatarsal nonunion.  She underwent ORIF 08/09/24.  PMH positive for ESRD, GERD, Arthritis, asthma/chronic bronchitis.  Clinical Impression  Patient presents with decreased mobility due to NWB (to TDWB) L LE with camboot.  She presents with decreased strength and limited safety awareness.  Previously independent living alone walking with a cane and no falls since June when she broke her foot.  She was able to ambulate with RW with boot though more like PWB than true TDWB.  Cautioned to limit ambulation for bathroom trips and to [place] for transport to dialysis.  She lives in second floor apartment with 13 steps to enter so educated to stay with her sister who has 3 steps to enter her one level home at least until post-op visit.  She was able to negotiate one step with RW and min A and handout given for sister to assist with RW and reverse technique to limit weight on L LE.  Patient appropriate for home with sister support as planned d/c home today.         If plan is discharge home, recommend the following: A little help with walking and/or transfers   Can travel by private vehicle        Equipment Recommendations Rolling walker (2 wheels)  Recommendations for Other Services       Functional Status Assessment Patient has had a recent decline in their functional status and demonstrates the ability to make significant improvements in function in a reasonable and predictable amount of time.     Precautions / Restrictions Precautions Precautions: Fall Precaution/Restrictions Comments: camboot L LE Restrictions Weight Bearing Restrictions Per Provider Order: Yes LLE Weight Bearing Per Provider Order: Non weight bearing (or TDWB if unable to maintain NWB)      Mobility  Bed Mobility Overal bed  mobility: Modified Independent                  Transfers Overall transfer level: Needs assistance Equipment used: Rolling walker (2 wheels) Transfers: Sit to/from Stand Sit to Stand: Supervision           General transfer comment: cues for hand placement and weight bearing restriction    Ambulation/Gait Ambulation/Gait assistance: Supervision, Contact guard assist Gait Distance (Feet): 90 Feet Assistive device: Rolling walker (2 wheels) Gait Pattern/deviations: Step-to pattern, Decreased stride length       General Gait Details: cues throughout for shorter strides, heavy weight through her arms and step to pattern to keep weight off her foot  Stairs Stairs: Yes Stairs assistance: Contact guard assist Stair Management: Step to pattern, With walker, Backwards Number of Stairs: 1 General stair comments: demonstrated technique and cues pt performed to 1 step with handout given and educated sister to hold the walker for 3 steps into sister's home  Wheelchair Mobility     Tilt Bed    Modified Rankin (Stroke Patients Only)       Balance Overall balance assessment: Needs assistance   Sitting balance-Leahy Scale: Good     Standing balance support: Bilateral upper extremity supported Standing balance-Leahy Scale: Poor Standing balance comment: due to weight bearing restriction                             Pertinent Vitals/Pain Pain Assessment Pain  Assessment: 0-10 Pain Score: 8  Pain Location: side of left foot Pain Descriptors / Indicators: Sore, Aching Pain Intervention(s): Monitored during session    Home Living Family/patient expects to be discharged to:: Private residence Living Arrangements: Alone Available Help at Discharge: Family;Available PRN/intermittently Type of Home: Apartment Home Access: Stairs to enter Entrance Stairs-Rails: Lawyer of Steps: flight   Home Layout: One level Home Equipment: Cane  - single point Additional Comments: sister lives in one level house 3-4 steps to enter with rail    Prior Function Prior Level of Function : Driving;Independent/Modified Independent             Mobility Comments: no falls since June when injured her foot ADLs Comments: drives to dialysis     Extremity/Trunk Assessment   Upper Extremity Assessment Upper Extremity Assessment: Overall WFL for tasks assessed    Lower Extremity Assessment Lower Extremity Assessment: LLE deficits/detail LLE Deficits / Details: wearing cam boot, can lift the leg though weakness present       Communication   Communication Communication: No apparent difficulties    Cognition Arousal: Alert Behavior During Therapy: WFL for tasks assessed/performed   PT - Cognitive impairments: No apparent impairments                                 Cueing       General Comments General comments (skin integrity, edema, etc.): Discussed staying at sister's home at least a couple of weeks.  States has transport to dialysis and sister can assist out of the home to the transport for dialysis.  Also discussed limited ambulation since unable to keep full NWB.    Exercises     Assessment/Plan    PT Assessment Patient does not need any further PT services  PT Problem List         PT Treatment Interventions      PT Goals (Current goals can be found in the Care Plan section)  Acute Rehab PT Goals PT Goal Formulation: All assessment and education complete, DC therapy    Frequency       Co-evaluation               AM-PAC PT 6 Clicks Mobility  Outcome Measure Help needed turning from your back to your side while in a flat bed without using bedrails?: None Help needed moving from lying on your back to sitting on the side of a flat bed without using bedrails?: None Help needed moving to and from a bed to a chair (including a wheelchair)?: A Little Help needed standing up from a chair  using your arms (e.g., wheelchair or bedside chair)?: A Little Help needed to walk in hospital room?: A Little Help needed climbing 3-5 steps with a railing? : A Little 6 Click Score: 20    End of Session Equipment Utilized During Treatment: Gait belt Activity Tolerance: Patient tolerated treatment well Patient left: in chair;with nursing/sitter in room   PT Visit Diagnosis: Difficulty in walking, not elsewhere classified (R26.2);Pain Pain - Right/Left: Left Pain - part of body: Ankle and joints of foot    Time: 8884-8846 PT Time Calculation (min) (ACUTE ONLY): 38 min   Charges:   PT Evaluation $PT Eval Moderate Complexity: 1 Mod PT Treatments $Gait Training: 8-22 mins PT General Charges $$ ACUTE PT VISIT: 1 Visit         Micheline Portal, PT Acute Rehabilitation  Services Office:[phone] 08/09/2024   Montie Portal 08/09/2024, 1:51 PM

## 2024-08-09 NOTE — Op Note (Signed)
 NAME: Victoria [name] MEDICAL RECORD WN:[id number] DATE OF BIRTH:12-05-1955 PHYSICIAN:Rise Traeger H. Lela Murfin, MD  OPERATIVE REPORT  DATE OF PROCEDURE: 08/09/2024  PREOPERATIVE DIAGNOSIS:  Left 5th metatarsal metadiaphyseal Jones fracture NONUNION.  POSTOPERATIVE DIAGNOSIS:  Left 5th metatarsal metadiaphyseal Jones fracture NONUNION.  PROCEDURE:  ORIF of LEFT 5th metatarsal fracture using a Biomet 5.0 mm cannulated screw.  SURGEON:  Ozell Bruch, MD  ASSISTANT:  None.  ANESTHESIA:  Sedation with local.  COMPLICATIONS:  None.  TOURNIQUET:  None.  DISPOSITION:  To PACU.  CONDITION:  Stable.  INDICATIONS FOR PROCEDURE:  Patient is a very pleasant 68 y.o. who sustained injury to left foot nearly six months ago. Subsequent x-rays demonstrated a nonunited fracture through the metadiaphysis of the left 5th metatarsal.  I discussed with the patient the risks and benefits of surgical repair, including the potential for persistent nonunion, symptomatic hardware, fracture at the tip of the implant, need for further surgery, infection, nerve injury, vessel injury, DVT, PE and others.  After acknowledgement of these risks, consent was provided to proceed.  BRIEF SUMMARY OF PROCEDURE:  The patient was taken to the operating room where Ancef  was administered.  A chlorhexidine  wash then Betadine scrub and paint were performed.  I then brought in  C-arm and used a tonsil clamp to localize the appropriate starting point and trajectory. I thin made the incision and spread the soft tissues with a clamp.  The guide pin was then inserted through the tip of the 5th metatarsal in appropriate trajectory and depth watching carefully on AP, oblique, and lateral views.  This was then measured, drilled, and tapped, and then a screw placed with excellent purchase and compression.  Final films were obtained.  The wound irrigated and closed in standard fashion with 2-0 nylon and a sterile gently compressive dressing with CAM  boot to be applied in the PACU.  The patient was awakened from anesthesia and transported to the PACU in stable condition.  PROGNOSIS:  The patient will be nonweightbearing for the next 6 weeks. Return to the office in two weeks for removal of suture.  After 6 weeks protected weightbearing until healing.  I remain concerned about compliance and have discussed this with patient and her sister.

## 2024-08-09 NOTE — Anesthesia Postprocedure Evaluation (Signed)
 Anesthesia Post Note  Patient: Victoria [name]  Procedure(s) Performed: REPAIR OF LEFT FIFTH METATARSAL NONUNION (Left: Fifth Toe)     Patient location during evaluation: PACU Anesthesia Type: General Level of consciousness: awake Pain management: pain level controlled Vital Signs Assessment: post-procedure vital signs reviewed and stable Respiratory status: spontaneous breathing Cardiovascular status: blood pressure returned to baseline Postop Assessment: no apparent nausea or vomiting Anesthetic complications: no   No notable events documented.  Last Vitals:  Vitals:   08/09/24 1015 08/09/24 1045  BP: 119/80 108/68  Pulse: 85 78  Resp: (!) 22 (!) 26  Temp:  36.4 C  SpO2: 94% 94%    Last Pain:  Vitals:   08/09/24 1000  TempSrc:   PainSc: 4                  Lauraine KATHEE [place]

## 2024-08-09 NOTE — Anesthesia Procedure Notes (Signed)
 Date/Time: 08/09/2024 8:12 AM  Performed by: Roslynn Waddell LABOR, CRNAPre-anesthesia Checklist: Patient identified, Emergency Drugs available, Suction available and Patient being monitored Patient Re-evaluated:Patient Re-evaluated prior to induction Oxygen Delivery Method: Circle System Utilized Preoxygenation: Pre-oxygenation with 100% oxygen Induction Type: IV induction Ventilation: Oral airway inserted - appropriate to patient size and Two handed mask ventilation required Laryngoscope Size: Mac and 3 Grade View: Grade I Tube type: Oral Tube size: 7.0 mm Number of attempts: 1 Airway Equipment and Method: Bite block Placement Confirmation: positive ETCO2 Secured at: 21 cm Tube secured with: Tape Dental Injury: Teeth and Oropharynx as per pre-operative assessment  Comments: Atraumatic induction/intubation. Dentition and oral mucosa as per preop.

## 2024-08-09 NOTE — Care Management (Signed)
 Transition of Care [facility]) - Inpatient Brief Assessment   Patient Details  Name: Victoria [name] MRN: [id number] Date of Birth: Dec 15, 1955  Transition of Care [facility]) CM/SW Contact:    Corean JAYSON Canary, RN Phone Number: 08/09/2024, 10:13 AM   Clinical Narrative:  Called from PACU regarding a walker need. Discussed with provider, walker ordered from [employer] who will deliver to PACU. The patient is discharged, no further needs identified  Transition of Care Asessment: Insurance and Status: Insurance coverage has been reviewed Patient has primary care physician: Yes Home environment has been reviewed: apartment Prior level of function:: Independent Prior/Current Home Services: No current home services Social Drivers of Health Review: SDOH reviewed no interventions necessary Readmission risk has been reviewed: Yes Transition of care needs: transition of care needs identified, TOC will continue to follow

## 2024-08-09 NOTE — Progress Notes (Signed)
 Orthopedic Tech Progress Note Patient Details:  [name] 1956/02/04 [id number]  Ortho Devices Type of Ortho Device: CAM walker Ortho Device/Splint Location: LLE Ortho Device/Splint Interventions: Ordered, Application   Post Interventions Patient Tolerated: Well  Shresta Risden A Onalee Steinbach 08/09/2024, 10:36 AM

## 2024-08-09 NOTE — Transfer of Care (Signed)
 Immediate Anesthesia Transfer of Care Note  Patient: Victoria [name]  Procedure(s) Performed: REPAIR OF LEFT FIFTH METATARSAL NONUNION (Left: Fifth Toe)  Patient Location: PACU  Anesthesia Type:General  Level of Consciousness: awake, alert , and oriented  Airway & Oxygen Therapy: Patient Spontanous Breathing  Post-op Assessment: Report given to RN and Post -op Vital signs reviewed and stable  Post vital signs: Reviewed and stable  Last Vitals:  Vitals Value Taken Time  BP 124/81 08/09/24 09:30  Temp    Pulse 89 08/09/24 09:33  Resp 25 08/09/24 09:33  SpO2 90 % 08/09/24 09:33  Vitals shown include unfiled device data.  Last Pain:  Vitals:   08/09/24 0705  TempSrc:   PainSc: 0-No pain         Complications: No notable events documented.

## 2024-08-13 ENCOUNTER — Encounter (HOSPITAL_COMMUNITY): Payer: Self-pay | Admitting: Orthopedic Surgery

## 2024-08-20 ENCOUNTER — Telehealth: Payer: Self-pay

## 2024-08-20 NOTE — Telephone Encounter (Signed)
 Attempt made to schedule patient for declot of dialysis access.  No answer / No VM

## 2024-08-22 ENCOUNTER — Other Ambulatory Visit: Payer: Self-pay

## 2024-08-22 ENCOUNTER — Encounter (HOSPITAL_COMMUNITY): Admission: RE | Disposition: A | Payer: Self-pay | Source: Home / Self Care | Attending: Vascular Surgery

## 2024-08-22 ENCOUNTER — Ambulatory Visit (HOSPITAL_COMMUNITY)
Admission: RE | Admit: 2024-08-22 | Discharge: 2024-08-22 | Disposition: A | Attending: Vascular Surgery | Admitting: Vascular Surgery

## 2024-08-22 HISTORY — PX: A/V FISTULAGRAM: CATH118298

## 2024-08-22 HISTORY — PX: VENOUS ANGIOPLASTY: CATH118376

## 2024-08-22 SURGERY — DIALYSIS/PERMA CATHETER INSERTION
Anesthesia: LOCAL | Site: Chest

## 2024-08-22 MED ORDER — LIDOCAINE HCL (PF) 1 % IJ SOLN
INTRAMUSCULAR | Status: DC | PRN
  Administered 2024-08-22: 5 mL

## 2024-08-22 MED ORDER — IODIXANOL 320 MG/ML IV SOLN
INTRAVENOUS | Status: DC | PRN
  Administered 2024-08-22: 25 mL via INTRAVENOUS

## 2024-08-22 MED ORDER — HEPARIN SODIUM (PORCINE) 1000 UNIT/ML IJ SOLN
INTRAMUSCULAR | Status: AC
  Filled 2024-08-22: qty 10

## 2024-08-22 MED ORDER — HEPARIN (PORCINE) IN NACL 1000-0.9 UT/500ML-% IV SOLN
INTRAVENOUS | Status: DC | PRN
  Administered 2024-08-22: 500 mL

## 2024-08-22 MED ORDER — LIDOCAINE HCL (PF) 1 % IJ SOLN
INTRAMUSCULAR | Status: AC
  Filled 2024-08-22: qty 30

## 2024-08-22 SURGICAL SUPPLY — 9 items
BALLOON MUSTANG 7.0X40 75 (BALLOONS) IMPLANT
DEVICE INFLATION ENCORE 26 (MISCELLANEOUS) IMPLANT
GUIDEWIRE ANGLED .035 180CM (WIRE) IMPLANT
KIT MICROPUNCTURE NIT STIFF (SHEATH) IMPLANT
SHEATH PINNACLE R/O II 6F 4CM (SHEATH) IMPLANT
SHEATH PROBE COVER 6X72 (BAG) IMPLANT
STOPCOCK MORSE 400PSI 3WAY (MISCELLANEOUS) IMPLANT
TRAY PV CATH (CUSTOM PROCEDURE TRAY) ×4 IMPLANT
TUBING CIL FLEX 10 FLL-RA (TUBING) IMPLANT

## 2024-08-22 NOTE — H&P (Signed)
 HD ACCESS CENTER H&P   Patient ID: Victoria [name], female   DOB: 07-Aug-1956, 68 y.o.   MRN: [id number]  Subjective:     HPI Victoria [name] is a 68 y.o. female with ESRD presenting for evaluation of HD access and consideration of intervention. Referred by: Dr Marlee Having issues with low flows during sessions.  She has had multiple thromboses requiring declot.  It was explained that her last declot that showed this occluded again a thrombectomy would not be pursued and she would need a catheter.  Past Medical History:  Diagnosis Date   Arthritis    Asthma    Chronic bronchitis (HCC)    Complication of anesthesia ~ 2011   they gave me a medicine that swolled me and mouth burning up (08/23/2012)   Complication of anesthesia    slow to wake up   Elevated hemidiaphragm    left   ESRD (end stage renal disease) on dialysis [facility]) Nephrologist-- dr deterding   ESRD due to HTN-; M/W/F; [address] (11/28/2015   GERD (gastroesophageal reflux disease)    no meds, diet controlled   History of acute pulmonary edema    2003   Hypertension    no longer on medications   Left patella fracture    Pneumonia    as a child   Seasonal allergies    Secondary hyperparathyroidism, renal    s/p  total parathyroidectomy  2014   Thyroid  disease    Wears glasses    Wound dehiscence, surgical 11/28/2015   Family History  Problem Relation Age of Onset   Healthy Mother    Hypertension Father    Kidney failure Father    Hypertension Sister    Thyroid  disease Sister    Past Surgical History:  Procedure Laterality Date   A/V SHUNT INTERVENTION N/A 02/23/2024   Procedure: A/V SHUNT INTERVENTION;  Surgeon: Pearline Norman RAMAN, MD;  Location: [facility];  Service: Cardiovascular;  Laterality: N/A;   A/V SHUNT INTERVENTION N/A 02/24/2024   Procedure: A/V SHUNT INTERVENTION;  Surgeon: Serene Gaile ORN, MD;  Location: [facility];  Service: Cardiovascular;  Laterality: N/A;   ANKLE FRACTURE SURGERY Right ~  2005   has pins in it   APPLICATION OF WOUND VAC Left 11/28/2015   knee   APPLICATION OF WOUND VAC Left 11/28/2015   Procedure: APPLICATION OF WOUND VAC;  Surgeon: Redell Shoals, MD;  Location: [facility];  Service: Orthopedics;  Laterality: Left;   ARTERIOVENOUS GRAFT PLACEMENT  03/25/2005   Right forearm  w/  multiple Revision's    AV FISTULA PLACEMENT Right 04/13/2022   Procedure: INSERTION OF RIGHT ARTERIOVENOUS (AV) 4-42mm x 45cm GORE-TEX GRAFT;  Surgeon: Eliza Lonni RAMAN, MD;  Location: [facility];  Service: Vascular;  Laterality: Right;   CARDIOVASCULAR STRESS TEST  08/24/2012   abnormal nuclear study/  inferolateral and anteroseptal areas of scar,  no ischemia/  normal LV function and wall motion, ef 77%   CATARACT EXTRACTION W/ INTRAOCULAR LENS IMPLANT Bilateral    cataract removal bilateral, lens implant in right eye   DIALYSIS FISTULA CREATION  09/20/1990   left upper arm ---  w/  Multiple Revision's until 2006   ECTOPIC PREGNANCY SURGERY  05/21/1969   FRACTURE SURGERY     HARDWARE REMOVAL Right 07/10/2020   Procedure: Removal of deep implant right fibula and tibia; steroid injection right ankle;  Surgeon: Kit Rush, MD;  [facility];  Service: Orthopedics;  Laterality: Right;  I & D EXTREMITY Left 11/28/2015   Procedure: IRRIGATION AND DEBRIDEMENT LEFT KNEE WOUND ;  Surgeon: Redell Shoals, MD;  [facility];  Service: Orthopedics;  Laterality: Left;   INCISION AND DRAINAGE OF WOUND Left 11/28/2015   knee   INSERTION OF DIALYSIS CATHETER Right 03/07/2023   Procedure: JERYL GUIDED INSERTION OF TUNNELED DIALYSIS CATHETER;  Surgeon: Magda Debby SAILOR, MD;  Location: [facility] OR;  Service: Vascular;  Laterality: Right;   IR FLUORO GUIDE CV LINE LEFT  02/09/2022   ORIF PATELLA Left 10/31/2015   Procedure: OPEN REDUCTION INTERNAL (ORIF) FIXATION LEFT PATELLA;  Surgeon: Redell Shoals, MD;  Location: [facility];  Service: Orthopedics;  Laterality: Left;    PATELLAR TENDON REPAIR  10/03/2012   Procedure: PATELLA TENDON REPAIR;  Surgeon: Franky CHRISTELLA Pointer, MD;  [facility];  Service: Orthopedics;  Laterality: Right;   PATELLECTOMY  10/03/2012   Procedure: PATELLECTOMY;  Surgeon: Franky CHRISTELLA Pointer, MD;  [facility];  Service: Orthopedics;  Laterality: Right;  RIGHT PARTIAL PATELLECTOMY AND PATELLA TENDON REPAIR   PERCUTANEOUS PINNING Left 08/09/2024   Procedure: REPAIR OF LEFT FIFTH METATARSAL NONUNION;  Surgeon: Celena Sharper, MD;  [facility];  Service: Orthopedics;  Laterality: Left;   PERIPHERAL VASCULAR THROMBECTOMY  02/24/2024   Procedure: PERIPHERAL VASCULAR THROMBECTOMY;  Surgeon: Serene Gaile ORN, MD;  Location: [facility];  Service: Cardiovascular;;   PERIPHERAL VASCULAR THROMBECTOMY Right 05/16/2024   Procedure: PERIPHERAL VASCULAR THROMBECTOMY;  Surgeon: Pearline Norman RAMAN, MD;  Location: [facility];  Service: Cardiovascular;  Laterality: Right;   PERIPHERAL VASCULAR THROMBECTOMY Right 06/08/2024   Procedure: PERIPHERAL VASCULAR THROMBECTOMY;  Surgeon: Magda Debby SAILOR, MD;  Location: [facility];  Service: Cardiovascular;  Laterality: Right;   REVISION OF ARTERIOVENOUS GORETEX GRAFT  09/27/2012   Procedure: REVISION OF ARTERIOVENOUS GORETEX GRAFT;  Surgeon: Lynwood JONETTA Collum, MD;  Location: [facility] OR;  Service: Vascular;  Laterality: Right;  1) Replacement of venous half of loop with 6mm Gortex graft  2) Excision of erroded pseudoaneurysm of graft with primary closure.   REVISION OF ARTERIOVENOUS GORETEX GRAFT Right 01/23/2013   Procedure: REVISION OF ARTERIOVENOUS GORETEX GRAFT;  Surgeon: Redell LITTIE Door, MD;  Location: [facility];  Service: Vascular;  Laterality: Right;  Using piece of 6mm x 20cm Gortex graft.    REVISION OF ARTERIOVENOUS GORETEX GRAFT Right 10/07/2015   Procedure: REVISION OF Right arm ARTERIOVENOUS GORETEX GRAFT;  Surgeon: Carlin FORBES Haddock, MD;  Location: [facility] OR;  Service: Vascular;  Laterality: Right;   REVISION OF ARTERIOVENOUS  GORETEX GRAFT Right 02/17/2016   Procedure: REVISION OF ARTERIOVENOUS GORETEX GRAFT;  Surgeon: Carlin FORBES Haddock, MD;  Location: [facility] OR;  Service: Vascular;  Laterality: Right;   REVISON OF ARTERIOVENOUS FISTULA Right 03/07/2023   Procedure: REVISON OF ARTERIOVENOUS FISTULA ARM;  Surgeon: Magda Debby SAILOR, MD;  [facility];  Service: Vascular;  Laterality: Right;   TOOTH EXTRACTION     10/2021   TOTAL ABDOMINAL HYSTERECTOMY  03/05/2005   w/  Right Ovarian Cystectomy   TOTAL PARATHYROIDECTOMY/  THYROID  ISTHMUSECTOMY/  AUTOTRANSPLANTATION PARATHYROID  TISSUE TO LEFT BRACHIORIADIALIS MUSCLE  02/18/2011   TRANSTHORACIC ECHOCARDIOGRAM  10/31/2012   mild LVH,  grade 1 diastolic dysfunction,  ef 55-65%/  mild MR/  trivial TR   VENOUS ANGIOPLASTY  02/23/2024   Procedure: VENOUS ANGIOPLASTY;  Surgeon: Pearline Norman RAMAN, MD;  Location: [facility];  Service: Cardiovascular;;   VENOUS STENT  02/24/2024   Procedure: VENOUS STENT;  Surgeon: Serene Gaile ORN, MD;  Location: [facility];  Service: Cardiovascular;;    Short Social History:  Social History   Tobacco Use   Smoking status: Never    Passive exposure: Never   Smokeless tobacco: Never  Substance Use Topics   Alcohol use: Not Currently    Comment: occasionally drinks a few beers    Allergies  Allergen Reactions   Amlodipine Swelling   Cefazolin      Pt received Ancef  on 10/21.2021 without issue   Cephalosporins Anaphylaxis    Pt received Ancef  on 07/10/2020 without issue   Lisinopril Swelling and Other (See Comments)    made my chest hurt    No current facility-administered medications for this encounter.    REVIEW OF SYSTEMS All other systems were reviewed and are negative     Objective:   Objective   Vitals:   08/22/24 0712  BP: 106/65  Pulse: 85  Resp: 18  SpO2: 96%   There is no height or weight on file to calculate BMI.  Physical Exam General: no acute distress Cardiac: hemodynamically stable Extremities: Faint  pulse and thrill in right arm AV graft  Data: Reviewed previous thrombectomy in September from Dr. Magda.     Assessment/Plan:   Victoria [name] is a 68 y.o. female with ESRD and a poorly functioning right arm AVG Having issues with low flows and clotting. Last HD session Monday.  We discussed that since there is still a faint pulse and thrill we would attempt a fistulogram.  I explained that should there be severe disease or thrombosis then we will plan to proceed with tunneled dialysis catheter placement. Risks and benefits were reviewed and the patient elected to proceed.   Norman Serve, MD [facility]

## 2024-08-22 NOTE — Op Note (Signed)
    Patient name: Victoria [name] MRN: [id number] DOB: 1956-07-20 Sex: female  08/22/2024 Pre-operative Diagnosis: ESRD on HD Post-operative diagnosis:  Same Surgeon:  Norman GORMAN Serve, MD Procedure Performed:  Outpatient evaluation, level 3 Ultrasound-guided access to dialysis circuit, fistulogram and central venogram, peripheral balloon angioplasty.  [id number]  Indications: [name] is a 68 year old female with ESRD and a poorly functioning right arm AVG.  She has had multiple declot's and it was explained that she would not be a candidate for further declotting and would need a tunneled dialysis catheter should her access thrombosed again.  Today she presents to the HD access center with low flows and clotting.  There is still a faint pulse and thrill and therefore I explained that we would attempt a fistulogram first to assess if we can salvage the access prior to thrombosis.  We also discussed that if there is severe disease or evidence of thrombosis that we would need to place tunneled dialysis catheter.  Risks and benefits were reviewed and she elected to proceed.  Findings:  Patent central venous system and outflow venous stents.  Severe stenosis at the peripheral stent edge.  Widely patent mid segment and proximal segment of the graft and arterial anastomosis.   Procedure:  The patient was identified in the holding area and taken to the cath lab  The patient was then placed supine on the table and prepped and draped in the usual sterile fashion.  A time out was called.  Ultrasound was used to evaluate the right arm AV access. This was accessed under u/s guidance. An 018 wire was advanced without resistance, a micropuncture sheath was placed and fistulagram obtained which demonstrated the above findings.  This access was then upsized to a 33F short sheath.  A Glidewire was used to cross the lesion and this was treated with a 7 mm x 40 mm Mustang balloon with excellent result.  The wire and sheath  were removed and the access was managed with a 4-0 Monocryl for hemostasis.  Contrast: 25 cc Sedation: None  Impression: Successful balloon angioplasty of the peripheral stent edge, 7 mm x 40 mm Mustang balloon. Remains amenable to endovascular therapy although I agree with Dr. Magda and should she have a complete thrombosis again there is no role for declot and would require TDC   Norman GORMAN Serve MD [facility] Office: [phone]

## 2024-08-23 ENCOUNTER — Ambulatory Visit (INDEPENDENT_AMBULATORY_CARE_PROVIDER_SITE_OTHER)

## 2024-08-23 ENCOUNTER — Encounter (HOSPITAL_COMMUNITY): Payer: Self-pay | Admitting: Vascular Surgery

## 2024-08-23 DIAGNOSIS — R053 Chronic cough: Secondary | ICD-10-CM

## 2024-08-23 LAB — PULMONARY FUNCTION TEST
DL/VA % pred: 136 %
DL/VA: 5.69 ml/min/mmHg/L
DLCO cor % pred: 68 %
DLCO cor: 12.86 ml/min/mmHg
DLCO unc % pred: 64 %
DLCO unc: 12.22 ml/min/mmHg
FEF 25-75 Post: 1.89 L/s
FEF 25-75 Pre: 1.31 L/s
FEF2575-%Change-Post: 45 %
FEF2575-%Pred-Post: 100 %
FEF2575-%Pred-Pre: 69 %
FEV1-%Change-Post: 10 %
FEV1-%Pred-Post: 51 %
FEV1-%Pred-Pre: 46 %
FEV1-Post: 1.13 L
FEV1-Pre: 1.02 L
FEV1FVC-%Change-Post: -2 %
FEV1FVC-%Pred-Pre: 114 %
FEV6-%Change-Post: 14 %
FEV6-%Pred-Post: 47 %
FEV6-%Pred-Pre: 41 %
FEV6-Post: 1.33 L
FEV6-Pre: 1.17 L
FEV6FVC-%Pred-Post: 104 %
FEV6FVC-%Pred-Pre: 104 %
FVC-%Change-Post: 13 %
FVC-%Pred-Post: 45 %
FVC-%Pred-Pre: 40 %
FVC-Post: 1.33 L
FVC-Pre: 1.18 L
Post FEV1/FVC ratio: 85 %
Post FEV6/FVC ratio: 100 %
Pre FEV1/FVC ratio: 87 %
Pre FEV6/FVC Ratio: 100 %
RV % pred: 172 %
RV: 3.65 L
TLC % pred: 100 %
TLC: 4.94 L

## 2024-08-23 NOTE — Progress Notes (Signed)
 Full PFT performed today.

## 2024-08-23 NOTE — Patient Instructions (Signed)
 Full PFT performed today.

## 2024-08-29 ENCOUNTER — Other Ambulatory Visit (HOSPITAL_COMMUNITY): Payer: Self-pay

## 2024-09-11 ENCOUNTER — Other Ambulatory Visit: Payer: Self-pay

## 2024-09-11 ENCOUNTER — Other Ambulatory Visit (HOSPITAL_COMMUNITY): Payer: Self-pay

## 2024-09-11 ENCOUNTER — Ambulatory Visit (INDEPENDENT_AMBULATORY_CARE_PROVIDER_SITE_OTHER)

## 2024-09-11 VITALS — BP 110/62 | HR 69 | Temp 97.8°F | Ht 65.0 in | Wt 171.4 lb

## 2024-09-11 DIAGNOSIS — Z79899 Other long term (current) drug therapy: Secondary | ICD-10-CM | POA: Diagnosis not present

## 2024-09-11 DIAGNOSIS — K449 Diaphragmatic hernia without obstruction or gangrene: Secondary | ICD-10-CM

## 2024-09-11 DIAGNOSIS — N186 End stage renal disease: Secondary | ICD-10-CM

## 2024-09-11 DIAGNOSIS — Z9109 Other allergy status, other than to drugs and biological substances: Secondary | ICD-10-CM | POA: Diagnosis not present

## 2024-09-11 DIAGNOSIS — J4540 Moderate persistent asthma, uncomplicated: Secondary | ICD-10-CM | POA: Diagnosis not present

## 2024-09-11 DIAGNOSIS — R053 Chronic cough: Secondary | ICD-10-CM

## 2024-09-11 DIAGNOSIS — Z7951 Long term (current) use of inhaled steroids: Secondary | ICD-10-CM | POA: Diagnosis not present

## 2024-09-11 DIAGNOSIS — Z992 Dependence on renal dialysis: Secondary | ICD-10-CM

## 2024-09-11 MED ORDER — IPRATROPIUM BROMIDE 0.03 % NA SOLN
2.0000 | Freq: Two times a day (BID) | NASAL | 12 refills | Status: AC
  Filled 2024-09-11: qty 30, 43d supply, fill #0

## 2024-09-11 MED ORDER — BUDESONIDE-FORMOTEROL FUMARATE 160-4.5 MCG/ACT IN AERO
2.0000 | INHALATION_SPRAY | Freq: Two times a day (BID) | RESPIRATORY_TRACT | 6 refills | Status: AC
  Filled 2024-09-11: qty 10.2, 30d supply, fill #0

## 2024-09-11 MED ORDER — MONTELUKAST SODIUM 10 MG PO TABS
10.0000 mg | ORAL_TABLET | Freq: Every day | ORAL | 11 refills | Status: AC
  Filled 2024-09-11: qty 30, 30d supply, fill #0

## 2024-09-11 NOTE — Patient Instructions (Addendum)
 It was a pleasure to see you today. Continue using singulair  daily Start the new inhaler symbicort  2 puffs twice a day Use albuterol  inhaler as needed Please rinse your mouth after inhaler use.

## 2024-09-11 NOTE — Assessment & Plan Note (Addendum)
 Chronic cough likely related to moderate persistent asthma, environmental allergies and postnasal drip Some response to Singulair  Will initiate asthma therapy as below

## 2024-09-11 NOTE — Progress Notes (Signed)
 "  New Patient Pulmonology Office Visit   Subjective:  Patient ID: Victoria [name], female    DOB: 02/22/1956  MRN: [id number]  Referred by: Pleas Newborn, MD  CC:  Chief Complaint  Patient presents with   Cough    Pt states since LOV breathing has been pretty good, doing better than she was doing Dry cough constantly, but doesn't cough as much as pt use to   HPI 06/2024:  Cough   Victoria [name] is a 68 y.o. female who is referred to this clinic for chronic cough.  Discussed the use of AI scribe software for clinical note transcription with the patient, who gave verbal consent to proceed.  History of Present Illness Victoria [name] is a 68 year old female with dialysis-dependent kidney failure who presents with persistent cough and scratchy throat.  She has been experiencing a persistent cough and scratchy throat for approximately five months. These symptoms are particularly bothersome in the morning and at night, especially when lying down, and are described as 'real, real scratchy.' The cough disrupts her sleep, causing her to be 'up all night coughing,' and is accompanied by spitting up mucus. Despite using treatments such as Flonase nasal spray, she has not found relief.  She has a significant medical history of kidney failure secondary to high blood pressure, requiring dialysis for 31 years. She does not produce urine and experiences leg swelling. Her blood pressure is variable, sometimes running low. She attends dialysis sessions regularly on Mondays, Wednesdays, and Fridays.  She was informed during the visit that she has a large hernia, which she was previously unaware of. She describes her stomach as 'getting big' and experiences pain and swelling, particularly when lying flat, which may contribute to her cough due to acid reflux  Additionally, she mentions a recent foot injury from June, which has not healed and required surgery to insert a stent. She attributes the delayed  healing to her dialysis treatment.  She does not smoke and lives alone. There is a strong family history of high blood pressure.  Denies wheezing, shortness of breath    Interval hx 09/11/2024 Cough has improved since last visit.  Has been taking Singulair  regularly since last visit. Still has regular dry cough, however not as frequently as last visit  underwent lab test and PFT, here to review results   Review of Systems  Respiratory:  Positive for cough.    Review of symptoms negative except mentioned above    Allergies: Amlodipine, Cefazolin , Cephalosporins, and Lisinopril  Current Outpatient Medications:    acetaminophen  (TYLENOL ) 325 MG tablet, Take 2 tablets (650 mg total) by mouth every 6 (six) hours as needed for mild pain (pain score 1-3)., Disp: 50 tablet, Rfl: 0   albuterol  (VENTOLIN  HFA) 108 (90 Base) MCG/ACT inhaler, Inhale 2 puffs into the lungs every 6 (six) hours as needed for wheezing or shortness of breath., Disp: 6.7 g, Rfl: 6   apixaban  (ELIQUIS ) 5 MG TABS tablet, Take 1 tablet (5 mg total) by mouth 2 (two) times daily., Disp: 180 tablet, Rfl: 3   budesonide -formoterol  (SYMBICORT ) 160-4.5 MCG/ACT inhaler, Inhale 2 puffs into the lungs in the morning and at bedtime., Disp: 10.2 g, Rfl: 6   calcium  acetate (PHOSLO ) 667 MG capsule, Take 1,334 mg by mouth 3 (three) times daily with meals., Disp: , Rfl:    cyclobenzaprine  (FLEXERIL ) 5 MG tablet, Take 1 tablet (5 mg total) by mouth 3 (three) times daily as needed for muscle  spasms., Disp: 30 tablet, Rfl: 0   midodrine  (PROAMATINE ) 10 MG tablet, Take 10 mg by mouth See admin instructions. Take 1 tablet by mouth three times a week as directed take one 30 minutes before starting dialysis and repeat x 1 halfway through tx., Disp: , Rfl:    omeprazole  (PRILOSEC) 20 MG capsule, Take 20 mg by mouth every morning., Disp: , Rfl:    oxyCODONE  (ROXICODONE ) 5 MG immediate release tablet, Take 1 tablet (5 mg total) by mouth every 6  (six) hours as needed for severe pain (pain score 7-10)., Disp: 28 tablet, Rfl: 0   ipratropium (ATROVENT ) 0.03 % nasal spray, Place 2 sprays into both nostrils every 12 (twelve) hours., Disp: 30 mL, Rfl: 12   montelukast  (SINGULAIR ) 10 MG tablet, Take 1 tablet (10 mg total) by mouth at bedtime., Disp: 30 tablet, Rfl: 11 Past Medical History:  Diagnosis Date   Arthritis    Asthma    Chronic bronchitis (HCC)    Complication of anesthesia ~ 2011   they gave me a medicine that swolled me and mouth burning up (08/23/2012)   Complication of anesthesia    slow to wake up   Elevated hemidiaphragm    left   ESRD (end stage renal disease) on dialysis [facility]) Nephrologist-- dr deterding   ESRD due to HTN-; M/W/F; [address] (11/28/2015   GERD (gastroesophageal reflux disease)    no meds, diet controlled   History of acute pulmonary edema    2003   Hypertension    no longer on medications   Left patella fracture    Pneumonia    as a child   Seasonal allergies    Secondary hyperparathyroidism, renal    s/p  total parathyroidectomy  2014   Thyroid  disease    Wears glasses    Wound dehiscence, surgical 11/28/2015   Past Surgical History:  Procedure Laterality Date   A/V FISTULAGRAM N/A 08/22/2024   Procedure: A/V Fistulagram;  Surgeon: Pearline Norman RAMAN, MD;  Location: [facility];  Service: Cardiovascular;  Laterality: N/A;   A/V SHUNT INTERVENTION N/A 02/23/2024   Procedure: A/V SHUNT INTERVENTION;  Surgeon: Pearline Norman RAMAN, MD;  Location: [facility];  Service: Cardiovascular;  Laterality: N/A;   A/V SHUNT INTERVENTION N/A 02/24/2024   Procedure: A/V SHUNT INTERVENTION;  Surgeon: Serene Gaile ORN, MD;  Location: [facility];  Service: Cardiovascular;  Laterality: N/A;   ANKLE FRACTURE SURGERY Right ~ 2005   has pins in it   APPLICATION OF WOUND VAC Left 11/28/2015   knee   APPLICATION OF WOUND VAC Left 11/28/2015   Procedure: APPLICATION OF WOUND VAC;  Surgeon: Redell Shoals, MD;   Location: [facility];  Service: Orthopedics;  Laterality: Left;   ARTERIOVENOUS GRAFT PLACEMENT  03/25/2005   Right forearm  w/  multiple Revision's    AV FISTULA PLACEMENT Right 04/13/2022   Procedure: INSERTION OF RIGHT ARTERIOVENOUS (AV) 4-40mm x 45cm GORE-TEX GRAFT;  Surgeon: Eliza Lonni RAMAN, MD;  Location: [facility] OR;  Service: Vascular;  Laterality: Right;   CARDIOVASCULAR STRESS TEST  08/24/2012   abnormal nuclear study/  inferolateral and anteroseptal areas of scar,  no ischemia/  normal LV function and wall motion, ef 77%   CATARACT EXTRACTION W/ INTRAOCULAR LENS IMPLANT Bilateral    cataract removal bilateral, lens implant in right eye   DIALYSIS FISTULA CREATION  09/20/1990   left upper arm ---  w/  Multiple Revision's until 2006   ECTOPIC PREGNANCY SURGERY  05/21/1969   FRACTURE SURGERY     HARDWARE REMOVAL Right 07/10/2020   Procedure: Removal of deep implant right fibula and tibia; steroid injection right ankle;  Surgeon: Kit Rush, MD;  Location: [facility] OR;  Service: Orthopedics;  Laterality: Right;   I & D EXTREMITY Left 11/28/2015   Procedure: IRRIGATION AND DEBRIDEMENT LEFT KNEE WOUND ;  Surgeon: Redell Shoals, MD;  Location: [facility];  Service: Orthopedics;  Laterality: Left;   INCISION AND DRAINAGE OF WOUND Left 11/28/2015   knee   INSERTION OF DIALYSIS CATHETER Right 03/07/2023   Procedure: JERYL GUIDED INSERTION OF TUNNELED DIALYSIS CATHETER;  Surgeon: Magda Debby SAILOR, MD;  Location: [facility] OR;  Service: Vascular;  Laterality: Right;   IR FLUORO GUIDE CV LINE LEFT  02/09/2022   ORIF PATELLA Left 10/31/2015   Procedure: OPEN REDUCTION INTERNAL (ORIF) FIXATION LEFT PATELLA;  Surgeon: Redell Shoals, MD;  Location: [facility];  Service: Orthopedics;  Laterality: Left;   PATELLAR TENDON REPAIR  10/03/2012   Procedure: PATELLA TENDON REPAIR;  Surgeon: Franky CHRISTELLA Pointer, MD;  Location: [facility];  Service: Orthopedics;  Laterality: Right;   PATELLECTOMY  10/03/2012    Procedure: PATELLECTOMY;  Surgeon: Franky CHRISTELLA Pointer, MD;  Location: [facility];  Service: Orthopedics;  Laterality: Right;  RIGHT PARTIAL PATELLECTOMY AND PATELLA TENDON REPAIR   PERCUTANEOUS PINNING Left 08/09/2024   Procedure: REPAIR OF LEFT FIFTH METATARSAL NONUNION;  Surgeon: Celena Sharper, MD;  Location: [facility];  Service: Orthopedics;  Laterality: Left;   PERIPHERAL VASCULAR THROMBECTOMY  02/24/2024   Procedure: PERIPHERAL VASCULAR THROMBECTOMY;  Surgeon: Serene Gaile ORN, MD;  Location: [facility];  Service: Cardiovascular;;   PERIPHERAL VASCULAR THROMBECTOMY Right 05/16/2024   Procedure: PERIPHERAL VASCULAR THROMBECTOMY;  Surgeon: Pearline Norman RAMAN, MD;  Location: [facility];  Service: Cardiovascular;  Laterality: Right;   PERIPHERAL VASCULAR THROMBECTOMY Right 06/08/2024   Procedure: PERIPHERAL VASCULAR THROMBECTOMY;  Surgeon: Magda Debby SAILOR, MD;  Location: [facility];  Service: Cardiovascular;  Laterality: Right;   REVISION OF ARTERIOVENOUS GORETEX GRAFT  09/27/2012   Procedure: REVISION OF ARTERIOVENOUS GORETEX GRAFT;  Surgeon: Lynwood JONETTA Collum, MD;  Location: [facility] OR;  Service: Vascular;  Laterality: Right;  1) Replacement of venous half of loop with 6mm Gortex graft  2) Excision of erroded pseudoaneurysm of graft with primary closure.   REVISION OF ARTERIOVENOUS GORETEX GRAFT Right 01/23/2013   Procedure: REVISION OF ARTERIOVENOUS GORETEX GRAFT;  Surgeon: Redell LITTIE Door, MD;  Location: [facility] OR;  Service: Vascular;  Laterality: Right;  Using piece of 6mm x 20cm Gortex graft.    REVISION OF ARTERIOVENOUS GORETEX GRAFT Right 10/07/2015   Procedure: REVISION OF Right arm ARTERIOVENOUS GORETEX GRAFT;  Surgeon: Carlin FORBES Haddock, MD;  Location: [facility] OR;  Service: Vascular;  Laterality: Right;   REVISION OF ARTERIOVENOUS GORETEX GRAFT Right 02/17/2016   Procedure: REVISION OF ARTERIOVENOUS GORETEX GRAFT;  Surgeon: Carlin FORBES Haddock, MD;  Location: [facility] OR;  Service: Vascular;  Laterality: Right;   REVISON OF  ARTERIOVENOUS FISTULA Right 03/07/2023   Procedure: REVISON OF ARTERIOVENOUS FISTULA ARM;  Surgeon: Magda Debby SAILOR, MD;  Location[facility];  Service: Vascular;  Laterality: Right;   TOOTH EXTRACTION     10/2021   TOTAL ABDOMINAL HYSTERECTOMY  03/05/2005   w/  Right Ovarian Cystectomy   TOTAL PARATHYROIDECTOMY/  THYROID  ISTHMUSECTOMY/  AUTOTRANSPLANTATION PARATHYROID  TISSUE TO LEFT BRACHIORIADIALIS MUSCLE  02/18/2011   TRANSTHORACIC ECHOCARDIOGRAM  10/31/2012   mild LVH,  grade 1 diastolic dysfunction,  ef 55-65%/  mild MR/  trivial TR   VENOUS ANGIOPLASTY  02/23/2024   Procedure: VENOUS ANGIOPLASTY;  Surgeon: Pearline Norman RAMAN, MD;  Location: [facility];  Service: Cardiovascular;;   VENOUS ANGIOPLASTY  08/22/2024   Procedure: VENOUS ANGIOPLASTY;  Surgeon: Pearline Norman RAMAN, MD;  Location: [facility];  Service: Cardiovascular;;   VENOUS STENT  02/24/2024   Procedure: VENOUS STENT;  Surgeon: Serene Gaile ORN, MD;  Location: [facility];  Service: Cardiovascular;;   Family History  Problem Relation Age of Onset   Healthy Mother    Hypertension Father    Kidney failure Father    Hypertension Sister    Thyroid  disease Sister    Social History   Socioeconomic History   Marital status: Legally Separated    Spouse name: Not on file   Number of children: Not on file   Years of education: Not on file   Highest education level: Not on file  Occupational History   Not on file  Tobacco Use   Smoking status: Never    Passive exposure: Never   Smokeless tobacco: Never  Vaping Use   Vaping status: Never Used  Substance and Sexual Activity   Alcohol use: Not Currently    Comment: occasionally drinks a few beers   Drug use: No   Sexual activity: Yes    Birth control/protection: Surgical    Comment: Hysterectomy  Other Topics Concern   Not on file  Social History Narrative   Not on file   Social Drivers of Health   Tobacco Use: Low Risk (09/11/2024)   Patient History    Smoking Tobacco  Use: Never    Smokeless Tobacco Use: Never    Passive Exposure: Never  Financial Resource Strain: Low Risk (07/05/2023)   Overall Financial Resource Strain (CARDIA)    Difficulty of Paying Living Expenses: Not hard at all  Food Insecurity: No Food Insecurity (07/05/2023)   Hunger Vital Sign    Worried About Running Out of Food in the Last Year: Never true    Ran Out of Food in the Last Year: Never true  Transportation Needs: No Transportation Needs (07/05/2023)   PRAPARE - Administrator, Civil Service (Medical): No    Lack of Transportation (Non-Medical): No  Physical Activity: Inactive (07/05/2023)   Exercise Vital Sign    Days of Exercise per Week: 0 days    Minutes of Exercise per Session: 0 min  Stress: No Stress Concern Present (07/05/2023)   [employer] of Occupational Health - Occupational Stress Questionnaire    Feeling of Stress : Not at all  Social Connections: Moderately Isolated (07/05/2023)   Social Connection and Isolation Panel    Frequency of Communication with Friends and Family: More than three times a week    Frequency of Social Gatherings with Friends and Family: Once a week    Attends Religious Services: 1 to 4 times per year    Active Member of [employer] or Organizations: No    Attends Banker Meetings: Never    Marital Status: Separated  Intimate Partner Violence: Not At Risk (07/05/2023)   Humiliation, Afraid, Rape, and Kick questionnaire    Fear of Current or Ex-Partner: No    Emotionally Abused: No    Physically Abused: No    Sexually Abused: No  Depression (PHQ2-9): Low Risk (08/07/2024)   Depression (PHQ2-9)    PHQ-2 Score: 0  Alcohol Screen: Low Risk (07/05/2023)   Alcohol Screen  Last Alcohol Screening Score (AUDIT): 0  Housing: Low Risk (07/05/2023)   Housing    Last Housing Risk Score: 0  Utilities: Not At Risk (07/05/2023)   AHC Utilities    Threatened with loss of utilities: No  Health Literacy: Adequate  Health Literacy (07/05/2023)   B1300 Health Literacy    Frequency of need for help with medical instructions: Never         Objective:  BP 110/62   Pulse 69   Temp 97.8 F (36.6 C) (Oral)   Ht 5' 5 (1.651 m) Comment: per patient  Wt 171 lb 6.4 oz (77.7 kg)   LMP  (LMP Unknown)   SpO2 99% Comment: on RA  BMI 28.52 kg/m    Physical Exam Constitutional:      General: She is not in acute distress.    Appearance: Normal appearance.  HENT:     Nose: No congestion.     Mouth/Throat:     Mouth: Mucous membranes are moist.     Pharynx: No posterior oropharyngeal erythema.  Cardiovascular:     Rate and Rhythm: Normal rate.  Pulmonary:     Effort: No respiratory distress.     Breath sounds: No wheezing or rales.  Musculoskeletal:     Right lower leg: No edema.     Left lower leg: No edema.  Skin:    General: Skin is warm.  Neurological:     Mental Status: She is alert and oriented to person, place, and time.  Psychiatric:        Mood and Affect: Mood normal.     Diagnostic Review:    Pft    Latest Ref Rng & Units 08/23/2024   10:15 AM  PFT Results  FVC-Pre L 1.18   FVC-Predicted Pre % 40   FVC-Post L 1.33   FVC-Predicted Post % 45   Pre FEV1/FVC % % 87   Post FEV1/FCV % % 85   FEV1-Pre L 1.02   FEV1-Predicted Pre % 46   FEV1-Post L 1.13   DLCO uncorrected ml/min/mmHg 12.22   DLCO UNC% % 64   DLCO corrected ml/min/mmHg 12.86   DLCO COR %Predicted % 68   DLVA Predicted % 136   TLC L 4.94   TLC % Predicted % 100   RV % Predicted % 172    Significant BD response- 13% change in FVC and 10% change in FEV1 , RV 172%    07/2024: 6.1% eosinophils, absolute eosinophils 400 [id number] 418 Results RADIOLOGY Chest  X-ray as far back as 2021: Large hernia         Assessment & Plan:   Assessment & Plan Chronic cough Chronic cough likely related to moderate persistent asthma, environmental allergies and postnasal drip Some response to Singulair  Will initiate  asthma therapy as below    Moderate persistent asthma not dependent on systemic steroids PFT shows bronchodilator response by percentages, although volume change was only 160.  Also has evidence of air trapping Increased eosinophils Positive response to Singulair , will start maintenance ICS containing inhaler Continue albuterol  as needed Orders:   budesonide -formoterol  (SYMBICORT ) 160-4.5 MCG/ACT inhaler; Inhale 2 puffs into the lungs in the morning and at bedtime.  ESRD needing dialysis [facility]) As per nephrology    Hiatal hernia Patient was instructed not to lay on bed earlier than 2 hours after last meal, was advised to avoid late night meals/snacks  Agree with omeprazole  orally daily.     Environmental allergies  Orders:  montelukast  (SINGULAIR ) 10 MG tablet; Take 1 tablet (10 mg total) by mouth at bedtime.   ipratropium (ATROVENT ) 0.03 % nasal spray; Place 2 sprays into both nostrils every 12 (twelve) hours.    Thank you for the opportunity to take part in the care of KENYETTE GUNDY   Return in about 6 months (around 03/12/2025).   Arlone Lenhardt Pleas, MD [facility] Office: [phone] "

## 2024-09-12 ENCOUNTER — Other Ambulatory Visit (HOSPITAL_COMMUNITY): Payer: Self-pay

## 2024-09-21 ENCOUNTER — Ambulatory Visit: Admitting: Physician Assistant

## 2024-09-21 ENCOUNTER — Other Ambulatory Visit (HOSPITAL_COMMUNITY): Payer: Self-pay

## 2024-09-21 ENCOUNTER — Encounter: Payer: Self-pay | Admitting: Physician Assistant

## 2024-09-21 VITALS — BP 90/60 | HR 60 | Ht 64.0 in | Wt 173.5 lb

## 2024-09-21 DIAGNOSIS — D631 Anemia in chronic kidney disease: Secondary | ICD-10-CM

## 2024-09-21 DIAGNOSIS — N186 End stage renal disease: Secondary | ICD-10-CM

## 2024-09-21 DIAGNOSIS — Z7901 Long term (current) use of anticoagulants: Secondary | ICD-10-CM | POA: Diagnosis not present

## 2024-09-21 DIAGNOSIS — R1310 Dysphagia, unspecified: Secondary | ICD-10-CM

## 2024-09-21 DIAGNOSIS — Z992 Dependence on renal dialysis: Secondary | ICD-10-CM

## 2024-09-21 DIAGNOSIS — K219 Gastro-esophageal reflux disease without esophagitis: Secondary | ICD-10-CM | POA: Diagnosis not present

## 2024-09-21 DIAGNOSIS — E66811 Obesity, class 1: Secondary | ICD-10-CM

## 2024-09-21 DIAGNOSIS — D649 Anemia, unspecified: Secondary | ICD-10-CM

## 2024-09-21 DIAGNOSIS — R1013 Epigastric pain: Secondary | ICD-10-CM

## 2024-09-21 MED ORDER — DICYCLOMINE HCL 20 MG PO TABS
20.0000 mg | ORAL_TABLET | Freq: Three times a day (TID) | ORAL | 0 refills | Status: AC | PRN
  Filled 2024-09-21: qty 50, 17d supply, fill #0

## 2024-09-21 MED ORDER — FAMOTIDINE 40 MG PO TABS
40.0000 mg | ORAL_TABLET | Freq: Every day | ORAL | 5 refills | Status: AC
  Filled 2024-09-21: qty 30, 30d supply, fill #0

## 2024-09-21 NOTE — Progress Notes (Addendum)
 "    09/21/2024 KALEY JUTRAS [id number] 05-Sep-1956  Referring provider: Katheen Roselie Rockford, NP Primary GI doctor: Dr. San  ASSESSMENT AND PLAN:  GERD with AB pain/cramping x 2-3 months, told has large HH but not seen on CXR (just left hemidiaphragm)  Regurgitation with ice/liquids, no issues with foods, no melena Has increase gas Prilosec 20 mg once a day x Oct helps GERD/cough -Add on pepcid  to the prilosec -Add on dicyclomine  20 mg TID - CT abdomen without contrast to evaluate structural causes, including pancreatic pathology. - Barium swallow to assess for esophageal motility disorder, structural abnormality, or hernia. - H. pylori testing for infectious etiology. - Laboratory studies including lipase and CBC. - Consider upper endoscopy (EGD) in hospital setting pending imaging results and anticoagulation management.  End-stage renal disease, recent thrombosis of AV graft, now on Elquis x 2 months by vascular surgeon Dr. Pearline On dialysis Monday Wednesday Friday - She may need to be on the Eliquis  uninterrupted for a period of time, will communicate with vascular surgery ( Dr. Pearline) when would be appropriate and schedule EGD/colon after that  Anemia Likely anemia of chronic disease, started on IV iron  recently 08/06/2024  HGB 13.3 MCV 76.6 Platelets 279.0 Recent Labs    08/06/24 1416 08/09/24 0636  HGB 11.9* 13.3  FOBT in the office negative Will get labs from neprhology Will repeat CBC, iron /ferritin Discussed colonoscopy if true anemia, and patient would be willing to do both EGD/colon at the hospital if felt it was needed Discussed risk of procedures  ADDENDUM: LABS from 10/03/2024 BUN postdialysis 19 creatinine 13 elevated parathyroid , low calcium  7.8, phosphate elevated 7.3.  Albumin  3.6, platelets 233, WBC 8.9 Hgb 13 MCV 81, iron  59, iron  binding capacity 202, saturation 29 ferritin 166  CCS Previous colonoscopy 2007 showed diverticulosis otherwise  unremarkable, recall 10 years Could not perform cologuard, states can not perform test No family history of colon cancer  Patient Care Team: Nche, Roselie Rockford, NP as PCP - [facility] (Internal Medicine) Center, [facility]  HISTORY OF PRESENT ILLNESS: 69 y.o. female with a past medical history listed below presents for evaluation of dysphagia, AB pain.   Discussed the use of AI scribe software for clinical note transcription with the patient, who gave verbal consent to proceed.  History of Present Illness   TIMI REESER is a 69 year old female with end-stage renal disease on hemodialysis, anemia, and GERD who presents with worsening epigastric pain and regurgitation of liquids.  For the past 2-3 months, she has experienced progressively worsening cramping upper abdominal pain beneath the breastbone, sometimes radiating to the back. Pain is exacerbated by coughing and has increased in severity, with one severe episode in November 2025 requiring a hospital visit. Pain is not related to eating solid foods and is not relieved by food or drink. No lower abdominal pain reported.  Over the past several months, new onset regurgitation of liquids, including water, ice, and juice, occurs immediately after drinking, especially with cold fluids. No regurgitation with solid foods, and she does not feel that food gets stuck or comes back up. Early satiety is present, with fullness after only a few spoonfuls and inability to finish meals. Weight has been stable or slightly increased.  History of acid reflux managed with omeprazole  20 mg daily since October 2025, which has improved heartburn and cough. She takes a slow-release magnesium supplement at night and occasionally uses Tylenol . No use of NSAIDs, Aleve, ibuprofen , or Goody powders. Denies  alcohol use and does not smoke.  Hemodialysis is performed three times weekly for end-stage renal disease via AV graft. She has been on apixaban  for the past  two months due to AV graft clotting, started after a fistulogram in December 2025. Iron  supplementation during dialysis for anemia began two weeks ago, with resolution of prior pica.  Colonoscopy in 2007 showed diverticulosis. She was unable to complete Cologuard testing. No history of blood in stool, melena, or dark stools. Stool consistency is variable, with episodes of loose stools, diarrhea, and hard stools. Increased flatulence is noted. No nausea, vomiting, or rectal pain.  In December 2025, she was told she had a hernia in her stomach after a hospital visit and chest x-ray, but is unclear on the details. Longstanding elevated left hemidiaphragm on imaging has been stable for years. No family history of gastrointestinal malignancy or other GI issues.      She  reports that she has never smoked. She has never been exposed to tobacco smoke. She has never used smokeless tobacco. She reports that she does not currently use alcohol. She reports that she does not use drugs.  RELEVANT GI HISTORY, IMAGING AND LABS: Results   Radiology Chest X-ray (08/06/2024): Elevated left hemidiaphragm; no evidence of hernia; no acute findings Chest X-ray (2024): Unchanged elevation of left hemidiaphragm  Diagnostic Colonoscopy (2007): Diverticulosis; otherwise normal      CBC    Component Value Date/Time   WBC 7.2 08/06/2024 1416   RBC 4.79 08/06/2024 1416   HGB 13.3 08/09/2024 0636   HCT 39.0 08/09/2024 0636   PLT 279.0 08/06/2024 1416   MCV 76.6 (L) 08/06/2024 1416   MCH 27.0 03/11/2023 2348   MCHC 32.5 08/06/2024 1416   RDW 16.5 (H) 08/06/2024 1416   LYMPHSABS 1.1 08/06/2024 1416   MONOABS 0.5 08/06/2024 1416   EOSABS 0.4 08/06/2024 1416   BASOSABS 0.1 08/06/2024 1416   Recent Labs    08/06/24 1416 08/09/24 0636  HGB 11.9* 13.3    CMP     Component Value Date/Time   NA 138 08/09/2024 0636   NA 140 06/30/2022 0000   K 4.4 08/09/2024 0636   CL 102 08/09/2024 0636   CO2 26  03/11/2023 2348   GLUCOSE 85 08/09/2024 0636   BUN 27 (H) 08/09/2024 0636   CREATININE 7.00 (H) 08/09/2024 0636   CALCIUM  9.6 03/11/2023 2348   PROT 7.1 05/30/2020 1430   ALBUMIN  2.7 (L) 03/08/2023 0241   AST 17 05/30/2020 1430   ALT 11 05/30/2020 1430   ALKPHOS 56 05/30/2020 1430   BILITOT 0.6 05/30/2020 1430   GFRNONAA 5 (L) 03/11/2023 2348   GFRAA 7 (L) 05/30/2020 1430      Latest Ref Rng & Units 03/08/2023    2:41 AM 05/30/2020    2:30 PM 05/26/2020    9:17 AM  Hepatic Function  Total Protein 6.5 - 8.1 g/dL  7.1  6.7   Albumin  3.5 - 5.0 g/dL 2.7  3.1  2.8   AST 15 - 41 U/L  17  20   ALT 0 - 44 U/L  11  12   Alk Phosphatase 38 - 126 U/L  56  53   Total Bilirubin 0.3 - 1.2 mg/dL  0.6  0.5       Current Medications:   Current Outpatient Medications (Cardiovascular):    midodrine  (PROAMATINE ) 10 MG tablet, Take 10 mg by mouth See admin instructions. Take 1 tablet by mouth three times a week  as directed take one 30 minutes before starting dialysis and repeat x 1 halfway through tx.  Current Outpatient Medications (Respiratory):    albuterol  (VENTOLIN  HFA) 108 (90 Base) MCG/ACT inhaler, Inhale 2 puffs into the lungs every 6 (six) hours as needed for wheezing or shortness of breath.   budesonide -formoterol  (SYMBICORT ) 160-4.5 MCG/ACT inhaler, Inhale 2 puffs into the lungs in the morning and at bedtime.   ipratropium (ATROVENT ) 0.03 % nasal spray, Place 2 sprays into both nostrils every 12 (twelve) hours.   montelukast  (SINGULAIR ) 10 MG tablet, Take 1 tablet (10 mg total) by mouth at bedtime.  Current Outpatient Medications (Analgesics):    acetaminophen  (TYLENOL ) 325 MG tablet, Take 2 tablets (650 mg total) by mouth every 6 (six) hours as needed for mild pain (pain score 1-3).   oxyCODONE  (ROXICODONE ) 5 MG immediate release tablet, Take 1 tablet (5 mg total) by mouth every 6 (six) hours as needed for severe pain (pain score 7-10).  Current Outpatient Medications (Hematological):     apixaban  (ELIQUIS ) 5 MG TABS tablet, Take 1 tablet (5 mg total) by mouth 2 (two) times daily.  Current Outpatient Medications (Other):    calcium  acetate (PHOSLO ) 667 MG capsule, Take 1,334 mg by mouth 3 (three) times daily with meals.   cyclobenzaprine  (FLEXERIL ) 5 MG tablet, Take 1 tablet (5 mg total) by mouth 3 (three) times daily as needed for muscle spasms.   dicyclomine  (BENTYL ) 20 MG tablet, Take 1 tablet (20 mg total) by mouth 3 (three) times daily as needed for spasms (AB pain).   famotidine  (PEPCID ) 40 MG tablet, Take 1 tablet (40 mg total) by mouth at bedtime.   omeprazole  (PRILOSEC) 20 MG capsule, Take 20 mg by mouth every morning.  Medical History:  Past Medical History:  Diagnosis Date   Arthritis    Asthma    Chronic bronchitis (HCC)    Complication of anesthesia ~ 2011   they gave me a medicine that swolled me and mouth burning up (08/23/2012)   Complication of anesthesia    slow to wake up   Elevated hemidiaphragm    left   ESRD (end stage renal disease) on dialysis [facility]) Nephrologist-- dr deterding   ESRD due to HTN-; M/W/F; [address] (11/28/2015   GERD (gastroesophageal reflux disease)    no meds, diet controlled   History of acute pulmonary edema    2003   Hypertension    no longer on medications   Left patella fracture    Pneumonia    as a child   Seasonal allergies    Secondary hyperparathyroidism, renal    s/p  total parathyroidectomy  2014   Thyroid  disease    Wears glasses    Wound dehiscence, surgical 11/28/2015   Allergies: Allergies[1]   Surgical History:  She  has a past surgical history that includes Dialysis fistula creation (09/20/1990); Revision of arteriovenous goretex graft (09/27/2012); Patellectomy (10/03/2012); Patellar tendon repair (10/03/2012); Revision of arteriovenous goretex graft (Right, 01/23/2013); Revision of arteriovenous goretex graft (Right, 10/07/2015); TOTAL PARATHYROIDECTOMY/  THYROID  ISTHMUSECTOMY/   AUTOTRANSPLANTATION PARATHYROID  TISSUE TO LEFT BRACHIORIADIALIS MUSCLE (02/18/2011); Ankle fracture surgery (Right, ~ 2005); Ectopic pregnancy surgery (05/21/1969); Arteriovenous graft placement (03/25/2005); transthoracic echocardiogram (10/31/2012); Cardiovascular stress test (08/24/2012); ORIF patella (Left, 10/31/2015); Incision and drainage of wound (Left, 11/28/2015); Application if wound vac (Left, 11/28/2015); Total abdominal hysterectomy (03/05/2005); Fracture surgery; I & D extremity (Left, 11/28/2015); Application if wound vac (Left, 11/28/2015); Revision of arteriovenous goretex graft (Right, 02/17/2016); Hardware Removal (Right,  07/10/2020); IR Fluoro Guide CV Line Left (02/09/2022); Cataract extraction w/ intraocular lens implant (Bilateral); Tooth extraction; AV fistula placement (Right, 04/13/2022); Revison of arteriovenous fistula (Right, 03/07/2023); Insertion of dialysis catheter (Right, 03/07/2023); A/V SHUNT INTERVENTION (N/A, 02/23/2024); VENOUS ANGIOPLASTY (02/23/2024); A/V SHUNT INTERVENTION (N/A, 02/24/2024); PERIPHERAL VASCULAR THROMBECTOMY (02/24/2024); VENOUS STENT (02/24/2024); PERIPHERAL VASCULAR THROMBECTOMY (Right, 05/16/2024); PERIPHERAL VASCULAR THROMBECTOMY (Right, 06/08/2024); Percutaneous pinning (Left, 08/09/2024); A/V Fistulagram (N/A, 08/22/2024); and VENOUS ANGIOPLASTY (08/22/2024). Family History:  Her family history includes Healthy in her mother; Hypertension in her father and sister; Kidney failure in her father; Thyroid  disease in her sister.  REVIEW OF SYSTEMS  : All other systems reviewed and negative except where noted in the History of Present Illness.  PHYSICAL EXAM: BP 90/60   Pulse 60   Ht 5' 4 (1.626 m)   Wt 173 lb 8 oz (78.7 kg) Comment: with boot on foot  LMP  (LMP Unknown)   BMI 29.78 kg/m  Physical Exam   GENERAL APPEARANCE: Well nourished, in no apparent distress. HEENT: No cervical lymphadenopathy, unremarkable thyroid , sclerae anicteric, conjunctiva  pink. RESPIRATORY: Respiratory effort normal, breath sounds equal bilaterally without rales, rhonchi, or wheezing. CARDIO: Regular rate and rhythm with no murmurs, rubs, or gallops, peripheral pulses intact. ABDOMEN: Soft, non-distended, active bowel sounds in all four quadrants, no tenderness to palpation, no rebound, no mass appreciated. RECTAL: No external hemorrhoids or masses, hemoccult negative. MUSCULOSKELETAL: Full range of motion, normal gait, without edema. SKIN: Dry, intact without rashes or lesions. No jaundice. NEURO: Alert, oriented, no focal deficits. PSYCH: Cooperative, normal mood and affect.      Alan JONELLE Coombs, PA-C 1:48 PM      [1]  Allergies Allergen Reactions   Amlodipine Swelling   Cefazolin      Pt received Ancef  on 10/21.2021 without issue   Cephalosporins Anaphylaxis    Pt received Ancef  on 07/10/2020 without issue   Lisinopril Swelling and Other (See Comments)    made my chest hurt   "

## 2024-09-21 NOTE — Patient Instructions (Addendum)
 _______________________________________________________  If your blood pressure at your visit was 140/90 or greater, please contact your primary care physician to follow up on this.  _______________________________________________________  If you are age 70 or older, your body mass index should be between 23-30. Your Body mass index is 29.78 kg/m. If this is out of the aforementioned range listed, please consider follow up with your Primary Care Provider.  If you are age 68 or younger, your body mass index should be between 19-25. Your Body mass index is 29.78 kg/m. If this is out of the aformentioned range listed, please consider follow up with your Primary Care Provider.   ________________________________________________________  The [facility] providers would like to encourage you to use MYCHART to communicate with providers for non-urgent requests or questions.  Due to long hold times on the telephone, sending your provider a message by [facility] may be a faster and more efficient way to get a response.  Please allow 48 business hours for a response.  Please remember that this is for non-urgent requests.  _______________________________________________________  [facility] is using a team-based approach to care.  Your team is made up of your doctor and two to three APPS. Our APPS (Nurse Practitioners and Physician Assistants) work with your physician to ensure care continuity for you. They are fully qualified to address your health concerns and develop a treatment plan. They communicate directly with your gastroenterologist to care for you. Seeing the Advanced Practice Practitioners on your physician's team can help you by facilitating care more promptly, often allowing for earlier appointments, access to diagnostic testing, procedures, and other specialty referrals.   Please drop your diatherix specimen to the lab before you leave the office   You have been scheduled for a CT  scan of the abdomen and pelvis at [facility] [place], [place], [place] [address]).   You are scheduled on 10-09-24 at 1130am. You should arrive by 930am your appointment time for registration. Please follow the written instructions below on the day of your exam:  WARNING: IF YOU ARE ALLERGIC TO IODINE /X-RAY DYE, PLEASE NOTIFY RADIOLOGY IMMEDIATELY AT [phone]! YOU WILL BE GIVEN A 13 HOUR PREMEDICATION PREP.  1) Do not eat anything after 930am (2 hours prior to your test) 2) You will be given 2 bottles of oral contrast to drink on site.    Drink 1 bottle of contrast @  930am (2 hours prior to your exam)  Drink 1 bottle of contrast @  1030am (1 hour prior to your exam)  You may take any medications as prescribed with a small amount of water, if necessary. If you take any of the following medications: METFORMIN, GLUCOPHAGE, GLUCOVANCE, AVANDAMET, RIOMET, FORTAMET, ACTOPLUS MET, JANUMET, GLUMETZA or METAGLIP, you MAY be asked to HOLD this medication 48 hours AFTER the exam.  The purpose of you drinking the oral contrast is to aid in the visualization of your intestinal tract. The contrast solution may cause some diarrhea. Depending on your individual set of symptoms, you may also receive an intravenous injection of x-ray contrast/dye. Plan on being at [facility] for 30 minutes or longer, depending on the type of exam you are having performed.  This test typically takes 30-45 minutes to complete.  If you have any questions regarding your exam or if you need to reschedule, you may call the CT department at [phone] between the hours of 8:00 am and 5:00 pm, Monday-Friday.  You have been scheduled for a Barium Esophogram at  [facility] (1st floor of the hospital) on 10-10-24 at 10am. Please arrive 30 minutes prior to your appointment for registration. Make certain not to have anything to eat or drink 3 hours prior to your test. If you need to reschedule for any  reason, please contact radiology at [phone] to do so. __________________________________________________________________ A barium swallow is an examination that concentrates on views of the esophagus. This tends to be a double contrast exam (barium and two liquids which, when combined, create a gas to distend the wall of the oesophagus) or single contrast (non-ionic iodine  based). The study is usually tailored to your symptoms so a good history is essential. Attention is paid during the study to the form, structure and configuration of the esophagus, looking for functional disorders (such as aspiration, dysphagia, achalasia, motility and reflux) EXAMINATION You may be asked to change into a gown, depending on the type of swallow being performed. A radiologist and radiographer will perform the procedure. The radiologist will advise you of the type of contrast selected for your procedure and direct you during the exam. You will be asked to stand, sit or lie in several different positions and to hold a small amount of fluid in your mouth before being asked to swallow while the imaging is performed .In some instances you may be asked to swallow barium coated marshmallows to assess the motility of a solid food bolus. The exam can be recorded as a digital or video fluoroscopy procedure. POST PROCEDURE It will take 1-2 days for the barium to pass through your system. To facilitate this, it is important, unless otherwise directed, to increase your fluids for the next 24-48hrs and to resume your normal diet.  This test typically takes about 30 minutes to perform. __________________________________________________________________________________  It was a pleasure to see you today!  Thank you for trusting me with your gastrointestinal care!

## 2024-09-25 ENCOUNTER — Telehealth: Payer: Self-pay | Admitting: Physician Assistant

## 2024-09-25 NOTE — Telephone Encounter (Signed)
Negative H pylori Diatherix

## 2024-09-25 NOTE — Telephone Encounter (Signed)
?  Pt made aware of recent results: ?Pt verbalized understanding with all questions answered.  ?

## 2024-10-02 ENCOUNTER — Other Ambulatory Visit (HOSPITAL_COMMUNITY): Payer: Self-pay

## 2024-10-05 ENCOUNTER — Encounter: Payer: Self-pay | Admitting: Physician Assistant

## 2024-10-09 ENCOUNTER — Ambulatory Visit (HOSPITAL_COMMUNITY)
Admission: RE | Admit: 2024-10-09 | Discharge: 2024-10-09 | Disposition: A | Discharge status: Home / Self Care | Source: Ambulatory Visit | Attending: Physician Assistant | Admitting: Physician Assistant

## 2024-10-09 DIAGNOSIS — R1013 Epigastric pain: Secondary | ICD-10-CM | POA: Diagnosis present

## 2024-10-09 DIAGNOSIS — R1310 Dysphagia, unspecified: Secondary | ICD-10-CM | POA: Diagnosis present

## 2024-10-09 DIAGNOSIS — D649 Anemia, unspecified: Secondary | ICD-10-CM | POA: Insufficient documentation

## 2024-10-09 MED ORDER — IOHEXOL 9 MG/ML PO SOLN
1000.0000 mL | ORAL | Status: AC
  Administered 2024-10-09: 1000 mL via ORAL

## 2024-10-10 ENCOUNTER — Ambulatory Visit: Payer: Self-pay | Admitting: Physician Assistant

## 2024-10-10 ENCOUNTER — Ambulatory Visit (HOSPITAL_COMMUNITY)
Admission: RE | Admit: 2024-10-10 | Discharge: 2024-10-10 | Disposition: A | Discharge status: Home / Self Care | Source: Ambulatory Visit | Attending: Physician Assistant | Admitting: Physician Assistant

## 2024-10-10 DIAGNOSIS — D649 Anemia, unspecified: Secondary | ICD-10-CM | POA: Insufficient documentation

## 2024-10-10 DIAGNOSIS — I517 Cardiomegaly: Secondary | ICD-10-CM

## 2024-10-10 DIAGNOSIS — R1013 Epigastric pain: Secondary | ICD-10-CM | POA: Diagnosis present

## 2024-10-10 DIAGNOSIS — R1310 Dysphagia, unspecified: Secondary | ICD-10-CM | POA: Insufficient documentation

## 2024-10-10 DIAGNOSIS — I700 Atherosclerosis of aorta: Secondary | ICD-10-CM

## 2024-10-10 DIAGNOSIS — I7121 Aneurysm of the ascending aorta, without rupture: Secondary | ICD-10-CM

## 2024-10-10 LAB — LAB REPORT - SCANNED
Calcium: 7.8
PTH: 119

## 2024-10-10 MED ORDER — OMEPRAZOLE 40 MG PO CPDR: DELAYED_RELEASE_CAPSULE | ORAL | 3 refills | Status: AC

## 2024-10-16 ENCOUNTER — Ambulatory Visit

## 2024-10-16 ENCOUNTER — Ambulatory Visit (INDEPENDENT_AMBULATORY_CARE_PROVIDER_SITE_OTHER)

## 2024-10-16 VITALS — Ht 64.0 in | Wt 170.0 lb

## 2024-10-16 DIAGNOSIS — Z0000 Encounter for general adult medical examination without abnormal findings: Secondary | ICD-10-CM | POA: Diagnosis not present

## 2024-10-16 NOTE — Patient Instructions (Signed)
 Ms. Victoria [name],  Thank you for taking the time for your Medicare Wellness Visit. I appreciate your continued commitment to your health goals. Please review the care plan we discussed, and feel free to reach out if I can assist you further.  Please note that Annual Wellness Visits do not include a physical exam. Some assessments may be limited, especially if the visit was conducted virtually. If needed, we may recommend an in-person follow-up with your provider.  Ongoing Care Seeing your primary care provider every 3 to 6 months helps us  monitor your health and provide consistent, personalized care.   Referrals If a referral was made during today's visit and you haven't received any updates within two weeks, please contact the referred provider directly to check on the status.  Recommended Screenings:  Health Maintenance  Topic Date Due   COVID-19 Vaccine (5 - 2025-26 season) 05/21/2024   Medicare Annual Wellness Visit  07/04/2024   Cologuard (Stool DNA test)  08/07/2025*   DTaP/Tdap/Td vaccine (3 - Td or Tdap) 03/31/2026   Breast Cancer Screening  07/12/2026   Pneumococcal Vaccine for age over 51  Completed   Flu Shot  Completed   Osteoporosis screening with Bone Density Scan  Completed   Hepatitis C Screening  Completed   Zoster (Shingles) Vaccine  Completed   Meningitis B Vaccine  Aged Out   Colon Cancer Screening  Discontinued  *Topic was postponed. The date shown is not the original due date.       10/16/2024    1:06 PM  Advanced Directives  Does Patient Have a Medical Advance Directive? No  Would patient like information on creating a medical advance directive? No - Patient declined    Vision: Annual vision screenings are recommended for early detection of glaucoma, cataracts, and diabetic retinopathy. These exams can also reveal signs of chronic conditions such as diabetes and high blood pressure.  Dental: Annual dental screenings help detect early signs of oral cancer, gum  disease, and other conditions linked to overall health, including heart disease and diabetes.  Please see the attached documents for additional preventive care recommendations.

## 2024-10-16 NOTE — Progress Notes (Signed)
 "  Chief Complaint  Patient presents with   Medicare Wellness     Subjective:   Victoria [name] is a 69 y.o. female who presents for a Medicare Annual Wellness Visit.  Visit info / Clinical Intake: Medicare Wellness Visit Type:: Subsequent Annual Wellness Visit Persons participating in visit and providing information:: patient Medicare Wellness Visit Mode:: Telephone If telephone:: video declined Since this visit was completed virtually, some vitals may be partially provided or unavailable. Missing vitals are due to the limitations of the virtual format.: Documented vitals are patient reported If Telephone or Video please confirm:: I connected with patient using audio/video enable telemedicine. I verified patient identity with two identifiers, discussed telehealth limitations, and patient agreed to proceed. Patient Location:: home Provider Location:: home office Interpreter Needed?: No Pre-visit prep was completed: yes AWV questionnaire completed by patient prior to visit?: no Living arrangements:: (!) lives alone Patient's Overall Health Status Rating: good Typical amount of pain: some Does pain affect daily life?: no Are you currently prescribed opioids?: no  Dietary Habits and Nutritional Risks How many meals a day?: 2 Eats fruit and vegetables daily?: yes Most meals are obtained by: preparing own meals In the last 2 weeks, have you had any of the following?: none Diabetic:: no  Functional Status Activities of Daily Living (to include ambulation/medication): Independent Ambulation: Independent Medication Administration: Independent Home Management (perform basic housework or laundry): Independent Manage your own finances?: yes Primary transportation is: driving Concerns about vision?: no *vision screening is required for WTM* Concerns about hearing?: no  Fall Screening Falls in the past year?: 1 (missed a chair) Number of falls in past year: 0 Was there an injury with  Fall?: 1 (broke foot) Fall Risk Category Calculator: 2 Patient Fall Risk Level: Moderate Fall Risk  Fall Risk Patient at Risk for Falls Due to: Medication side effect; Impaired mobility Fall risk Follow up: Falls prevention discussed; Education provided; Falls evaluation completed  Home and Transportation Safety: All rugs have non-skid backing?: N/A, no rugs All stairs or steps have railings?: yes Grab bars in the bathtub or shower?: (!) no Have non-skid surface in bathtub or shower?: yes Good home lighting?: yes Regular seat belt use?: yes Hospital stays in the last year:: no  Cognitive Assessment Difficulty concentrating, remembering, or making decisions? : no Will 6CIT or Mini Cog be Completed: yes What year is it?: 0 points What month is it?: 0 points Give patient an address phrase to remember (5 components): [address] [place] About what time is it?: 0 points Count backwards from 20 to 1: 0 points Say the months of the year in reverse: 4 points Repeat the address phrase from earlier: 0 points 6 CIT Score: 4 points  Advance Directives (For Healthcare) Does Patient Have a Medical Advance Directive?: No Would patient like information on creating a medical advance directive?: No - Patient declined  Reviewed/Updated  Reviewed/Updated: Reviewed All (Medical, Surgical, Family, Medications, Allergies, Care Teams, Patient Goals)    Allergies (verified) Amlodipine, Cefazolin , Cephalosporins, and Lisinopril   Current Medications (verified) Outpatient Encounter Medications as of 10/16/2024  Medication Sig   acetaminophen  (TYLENOL ) 325 MG tablet Take 2 tablets (650 mg total) by mouth every 6 (six) hours as needed for mild pain (pain score 1-3).   albuterol  (VENTOLIN  HFA) 108 (90 Base) MCG/ACT inhaler Inhale 2 puffs into the lungs every 6 (six) hours as needed for wheezing or shortness of breath.   apixaban  (ELIQUIS ) 5 MG TABS tablet Take 1  tablet (5 mg total) by mouth 2  (two) times daily.   budesonide -formoterol  (SYMBICORT ) 160-4.5 MCG/ACT inhaler Inhale 2 puffs into the lungs in the morning and at bedtime.   calcium  acetate (PHOSLO ) 667 MG capsule Take 1,334 mg by mouth 3 (three) times daily with meals.   cyclobenzaprine  (FLEXERIL ) 5 MG tablet Take 1 tablet (5 mg total) by mouth 3 (three) times daily as needed for muscle spasms.   dicyclomine  (BENTYL ) 20 MG tablet Take 1 tablet (20 mg total) by mouth 3 (three) times daily as needed for spasms (AB pain).   famotidine  (PEPCID ) 40 MG tablet Take 1 tablet (40 mg total) by mouth at bedtime.   ipratropium (ATROVENT ) 0.03 % nasal spray Place 2 sprays into both nostrils every 12 (twelve) hours.   midodrine  (PROAMATINE ) 10 MG tablet Take 10 mg by mouth See admin instructions. Take 1 tablet by mouth three times a week as directed take one 30 minutes before starting dialysis and repeat x 1 halfway through tx.   montelukast  (SINGULAIR ) 10 MG tablet Take 1 tablet (10 mg total) by mouth at bedtime.   omeprazole  (PRILOSEC) 40 MG capsule Take 1 capsule (40 mg) two times a day, morning and evening, for 8 weeks. After 8 weeks please take 1 capsule (40 mg) one time a day.   oxyCODONE  (ROXICODONE ) 5 MG immediate release tablet Take 1 tablet (5 mg total) by mouth every 6 (six) hours as needed for severe pain (pain score 7-10). (Patient not taking: Reported on 10/16/2024)   No facility-administered encounter medications on file as of 10/16/2024.    History: Past Medical History:  Diagnosis Date   Arthritis    Asthma    Chronic bronchitis (HCC)    Complication of anesthesia ~ 2011   they gave me a medicine that swolled me and mouth burning up (08/23/2012)   Complication of anesthesia    slow to wake up   Elevated hemidiaphragm    left   ESRD (end stage renal disease) on dialysis [facility]) Nephrologist-- dr deterding   ESRD due to HTN-; M/W/F; [address] (11/28/2015   GERD (gastroesophageal reflux disease)    no meds, diet  controlled   History of acute pulmonary edema    2003   Hypertension    no longer on medications   Left patella fracture    Pneumonia    as a child   Seasonal allergies    Secondary hyperparathyroidism, renal    s/p  total parathyroidectomy  2014   Thyroid  disease    Wears glasses    Wound dehiscence, surgical 11/28/2015   Past Surgical History:  Procedure Laterality Date   A/V FISTULAGRAM N/A 08/22/2024   Procedure: A/V Fistulagram;  Surgeon: Pearline Norman RAMAN, MD;  Location: [facility];  Service: Cardiovascular;  Laterality: N/A;   A/V SHUNT INTERVENTION N/A 02/23/2024   Procedure: A/V SHUNT INTERVENTION;  Surgeon: Pearline Norman RAMAN, MD;  Location: [facility];  Service: Cardiovascular;  Laterality: N/A;   A/V SHUNT INTERVENTION N/A 02/24/2024   Procedure: A/V SHUNT INTERVENTION;  Surgeon: Serene Gaile ORN, MD;  Location: [facility];  Service: Cardiovascular;  Laterality: N/A;   ANKLE FRACTURE SURGERY Right ~ 2005   has pins in it   APPLICATION OF WOUND VAC Left 11/28/2015   knee   APPLICATION OF WOUND VAC Left 11/28/2015   Procedure: APPLICATION OF WOUND VAC;  Surgeon: Redell Shoals, MD;  Location: [facility];  Service: Orthopedics;  Laterality: Left;   ARTERIOVENOUS  GRAFT PLACEMENT  03/25/2005   Right forearm  w/  multiple Revision's    AV FISTULA PLACEMENT Right 04/13/2022   Procedure: INSERTION OF RIGHT ARTERIOVENOUS (AV) 4-43mm x 45cm GORE-TEX GRAFT;  Surgeon: Eliza Lonni RAMAN, MD;  Location: [facility] OR;  Service: Vascular;  Laterality: Right;   CARDIOVASCULAR STRESS TEST  08/24/2012   abnormal nuclear study/  inferolateral and anteroseptal areas of scar,  no ischemia/  normal LV function and wall motion, ef 77%   CATARACT EXTRACTION W/ INTRAOCULAR LENS IMPLANT Bilateral    cataract removal bilateral, lens implant in right eye   DIALYSIS FISTULA CREATION  09/20/1990   left upper arm ---  w/  Multiple Revision's until 2006   ECTOPIC PREGNANCY SURGERY  05/21/1969   FRACTURE SURGERY      HARDWARE REMOVAL Right 07/10/2020   Procedure: Removal of deep implant right fibula and tibia; steroid injection right ankle;  Surgeon: Kit Rush, MD;  Location: [facility];  Service: Orthopedics;  Laterality: Right;   I & D EXTREMITY Left 11/28/2015   Procedure: IRRIGATION AND DEBRIDEMENT LEFT KNEE WOUND ;  Surgeon: Redell Shoals, MD;  Location: [facility];  Service: Orthopedics;  Laterality: Left;   INCISION AND DRAINAGE OF WOUND Left 11/28/2015   knee   INSERTION OF DIALYSIS CATHETER Right 03/07/2023   Procedure: JERYL GUIDED INSERTION OF TUNNELED DIALYSIS CATHETER;  Surgeon: Magda Debby SAILOR, MD;  Location: [facility] OR;  Service: Vascular;  Laterality: Right;   IR FLUORO GUIDE CV LINE LEFT  02/09/2022   ORIF PATELLA Left 10/31/2015   Procedure: OPEN REDUCTION INTERNAL (ORIF) FIXATION LEFT PATELLA;  Surgeon: Redell Shoals, MD;  Location: [facility];  Service: Orthopedics;  Laterality: Left;   PATELLAR TENDON REPAIR  10/03/2012   Procedure: PATELLA TENDON REPAIR;  Surgeon: Franky CHRISTELLA Pointer, MD;  Location: [facility];  Service: Orthopedics;  Laterality: Right;   PATELLECTOMY  10/03/2012   Procedure: PATELLECTOMY;  Surgeon: Franky CHRISTELLA Pointer, MD;  Location: [facility];  Service: Orthopedics;  Laterality: Right;  RIGHT PARTIAL PATELLECTOMY AND PATELLA TENDON REPAIR   PERCUTANEOUS PINNING Left 08/09/2024   Procedure: REPAIR OF LEFT FIFTH METATARSAL NONUNION;  Surgeon: Celena Sharper, MD;  Location: [facility];  Service: Orthopedics;  Laterality: Left;   PERIPHERAL VASCULAR THROMBECTOMY  02/24/2024   Procedure: PERIPHERAL VASCULAR THROMBECTOMY;  Surgeon: Serene Gaile ORN, MD;  Location: [facility];  Service: Cardiovascular;;   PERIPHERAL VASCULAR THROMBECTOMY Right 05/16/2024   Procedure: PERIPHERAL VASCULAR THROMBECTOMY;  Surgeon: Pearline Norman RAMAN, MD;  Location: [facility];  Service: Cardiovascular;  Laterality: Right;   PERIPHERAL VASCULAR THROMBECTOMY Right 06/08/2024   Procedure: PERIPHERAL VASCULAR  THROMBECTOMY;  Surgeon: Magda Debby SAILOR, MD;  Location: [facility];  Service: Cardiovascular;  Laterality: Right;   REVISION OF ARTERIOVENOUS GORETEX GRAFT  09/27/2012   Procedure: REVISION OF ARTERIOVENOUS GORETEX GRAFT;  Surgeon: Lynwood JONETTA Collum, MD;  Location: [facility] OR;  Service: Vascular;  Laterality: Right;  1) Replacement of venous half of loop with 6mm Gortex graft  2) Excision of erroded pseudoaneurysm of graft with primary closure.   REVISION OF ARTERIOVENOUS GORETEX GRAFT Right 01/23/2013   Procedure: REVISION OF ARTERIOVENOUS GORETEX GRAFT;  Surgeon: Redell LITTIE Door, MD;  Location: [facility] OR;  Service: Vascular;  Laterality: Right;  Using piece of 6mm x 20cm Gortex graft.    REVISION OF ARTERIOVENOUS GORETEX GRAFT Right 10/07/2015   Procedure: REVISION OF Right arm ARTERIOVENOUS GORETEX GRAFT;  Surgeon: Carlin FORBES Haddock, MD;  Location: [facility]  OR;  Service: Vascular;  Laterality: Right;   REVISION OF ARTERIOVENOUS GORETEX GRAFT Right 02/17/2016   Procedure: REVISION OF ARTERIOVENOUS GORETEX GRAFT;  Surgeon: Carlin FORBES Haddock, MD;  Location: [facility] OR;  Service: Vascular;  Laterality: Right;   REVISON OF ARTERIOVENOUS FISTULA Right 03/07/2023   Procedure: REVISON OF ARTERIOVENOUS FISTULA ARM;  Surgeon: Magda Debby SAILOR, MD;  Location: [facility];  Service: Vascular;  Laterality: Right;   TOOTH EXTRACTION     10/2021   TOTAL ABDOMINAL HYSTERECTOMY  03/05/2005   w/  Right Ovarian Cystectomy   TOTAL PARATHYROIDECTOMY/  THYROID  ISTHMUSECTOMY/  AUTOTRANSPLANTATION PARATHYROID  TISSUE TO LEFT BRACHIORIADIALIS MUSCLE  02/18/2011   TRANSTHORACIC ECHOCARDIOGRAM  10/31/2012   mild LVH,  grade 1 diastolic dysfunction,  ef 55-65%/  mild MR/  trivial TR   VENOUS ANGIOPLASTY  02/23/2024   Procedure: VENOUS ANGIOPLASTY;  Surgeon: Pearline Norman RAMAN, MD;  Location: [facility];  Service: Cardiovascular;;   VENOUS ANGIOPLASTY  08/22/2024   Procedure: VENOUS ANGIOPLASTY;  Surgeon: Pearline Norman RAMAN, MD;  Location: [facility];  Service:  Cardiovascular;;   VENOUS STENT  02/24/2024   Procedure: VENOUS STENT;  Surgeon: Serene Gaile ORN, MD;  Location: [facility];  Service: Cardiovascular;;   Family History  Problem Relation Age of Onset   Healthy Mother    Hypertension Father    Kidney failure Father    Hypertension Sister    Thyroid  disease Sister    Social History   Occupational History   Not on file  Tobacco Use   Smoking status: Never    Passive exposure: Never   Smokeless tobacco: Never  Vaping Use   Vaping status: Never Used  Substance and Sexual Activity   Alcohol use: Not Currently    Comment: occasionally drinks a few beers   Drug use: No   Sexual activity: Yes    Birth control/protection: Surgical    Comment: Hysterectomy   Tobacco Counseling Counseling given: Not Answered  SDOH Screenings   Food Insecurity: No Food Insecurity (10/16/2024)  Housing: Unknown (10/16/2024)  Transportation Needs: No Transportation Needs (10/16/2024)  Utilities: Not At Risk (10/16/2024)  Alcohol Screen: Low Risk (10/16/2024)  Depression (PHQ2-9): Low Risk (10/16/2024)  Financial Resource Strain: Low Risk (10/16/2024)  Physical Activity: Inactive (10/16/2024)  Social Connections: Moderately Isolated (10/16/2024)  Stress: No Stress Concern Present (10/16/2024)  Tobacco Use: Low Risk (10/16/2024)  Health Literacy: Adequate Health Literacy (10/16/2024)   See flowsheets for full screening details  Depression Screen PHQ 2 & 9 Depression Scale- Over the past 2 weeks, how often have you been bothered by any of the following problems? Little interest or pleasure in doing things: 0 Feeling down, depressed, or hopeless (PHQ Adolescent also includes...irritable): 0 PHQ-2 Total Score: 0 Trouble falling or staying asleep, or sleeping too much: 0 Feeling tired or having little energy: 0 Poor appetite or overeating (PHQ Adolescent also includes...weight loss): 0 Feeling bad about yourself - or that you are a failure or have let  yourself or your family down: 0 Trouble concentrating on things, such as reading the newspaper or watching television (PHQ Adolescent also includes...like school work): 0 Moving or speaking so slowly that other people could have noticed. Or the opposite - being so fidgety or restless that you have been moving around a lot more than usual: 0 Thoughts that you would be better off dead, or of hurting yourself in some way: 0 PHQ-9 Total Score: 0 If you checked off any problems, how difficult  have these problems made it for you to do your work, take care of things at home, or get along with other people?: Not difficult at all  Depression Treatment Depression Interventions/Treatment : EYV7-0 Score <4 Follow-up Not Indicated     Goals Addressed               This Visit's Progress     get swallowing under control (pt-stated)               Objective:    Today's Vitals   10/16/24 1257  Weight: 170 lb (77.1 kg)  Height: 5' 4 (1.626 m)   Body mass index is 29.18 kg/m.  Hearing/Vision screen Vision Screening - Comments:: No regular eye exams Immunizations and Health Maintenance Health Maintenance  Topic Date Due   COVID-19 Vaccine (5 - 2025-26 season) 05/21/2024   Fecal DNA (Cologuard)  08/07/2025 (Originally 06/13/2023)   Medicare Annual Wellness (AWV)  10/16/2025   DTaP/Tdap/Td (3 - Td or Tdap) 03/31/2026   Mammogram  07/12/2026   Pneumococcal Vaccine: 50+ Years  Completed   Influenza Vaccine  Completed   Bone Density Scan  Completed   Hepatitis C Screening  Completed   Zoster Vaccines- Shingrix  Completed   Meningococcal B Vaccine  Aged Out   Colonoscopy  Discontinued        Assessment/Plan:  This is a routine wellness examination for Victoria [name].  Patient Care Team: Nche, Roselie Rockford, NP as PCP - General (Internal Medicine) Center, [facility]  I have personally reviewed and noted the following in the patients chart:   Medical and social history Use of  alcohol, tobacco or illicit drugs  Current medications and supplements including opioid prescriptions. Functional ability and status Nutritional status Physical activity Advanced directives List of other physicians Hospitalizations, surgeries, and ER visits in previous 12 months Vitals Screenings to include cognitive, depression, and falls Referrals and appointments  No orders of the defined types were placed in this encounter.  In addition, I have reviewed and discussed with patient certain preventive protocols, quality metrics, and best practice recommendations. A written personalized care plan for preventive services as well as general preventive health recommendations were provided to patient.   Ardella FORBES Dawn, LPN   8/72/7973   Return in 1 year (on 10/16/2025).  After Visit Summary: (Pick Up) Due to this being a telephonic visit, with patients personalized plan was offered to patient and patient has requested to Pick up at office.  Nurse Notes: HM Addressed: Vaccines Due: covid  "

## 2024-10-17 NOTE — Telephone Encounter (Signed)
-----   Message from Alan JONELLE Coombs sent at 10/17/2024 12:20 PM EST ----- Shows multiple nonacute findings.  But no findings for abdominal pain. Shows a ascending thoracic aortic aneurysm not completely characterized. Shows significant atherosclerosis through aorta and branches coming from the aorta such as iliacs celiac trunk and superior mesenteric artery.  May need to consider mesenteric ultrasound or CT  angiography Shows mild heart enlargement, please follow-up with cardiology. Please send this report to cardiology and have them follow-up with cardiology/primary care. Shows bilateral renal atrophy and renal cyst similar to 2018, no need for follow-up. Shows cystic structure near the inferior vena cava or large blood vessel which has been stable since 2015, compatible with a benign lesion does not need follow-up. Shows diverticulosis which a little pockets in the colon but no associated diverticulitis please add on fiber supplement. Shows hepatic lesions which are similar to when seen in 2018.  Overall from a GI standpoint reassuring CT abdomen pelvis, will send this to primary care and suggest sending to cardiology as well.

## 2024-10-23 ENCOUNTER — Ambulatory Visit: Admitting: Cardiology

## 2024-10-23 ENCOUNTER — Other Ambulatory Visit (HOSPITAL_COMMUNITY): Payer: Self-pay | Admitting: Cardiology

## 2024-10-23 ENCOUNTER — Encounter: Payer: Self-pay | Admitting: Cardiology

## 2024-10-23 VITALS — BP 100/64 | HR 75 | Ht 61.0 in | Wt 174.0 lb

## 2024-10-23 DIAGNOSIS — I7121 Aneurysm of the ascending aorta, without rupture: Secondary | ICD-10-CM | POA: Insufficient documentation

## 2024-10-23 DIAGNOSIS — I712 Thoracic aortic aneurysm, without rupture, unspecified: Secondary | ICD-10-CM | POA: Insufficient documentation

## 2024-10-23 DIAGNOSIS — I700 Atherosclerosis of aorta: Secondary | ICD-10-CM

## 2024-10-23 DIAGNOSIS — R0989 Other specified symptoms and signs involving the circulatory and respiratory systems: Secondary | ICD-10-CM | POA: Diagnosis not present

## 2024-10-23 NOTE — Patient Instructions (Signed)
" °  Lab Work: LIPID PANEL  BMP  If you have labs (blood work) drawn today and your tests are completely normal, you will receive your results only by: MyChart Message (if you have MyChart) OR A paper copy in the mail If you have any lab test that is abnormal or we need to change your treatment, we will call you to review the results.  Testing/Procedures: ECHOCARDIOGRAM   Your physician has requested that you have an echocardiogram. Echocardiography is a painless test that uses sound waves to create images of your heart. It provides your doctor with information about the size and shape of your heart and how well your hearts chambers and valves are working. This procedure takes approximately one hour. There are no restrictions for this procedure. Please do NOT wear cologne, perfume, aftershave, or lotions (deodorant is allowed). Please arrive 15 minutes prior to your appointment time.  Please note: We ask at that you not bring children with you during ultrasound (echo/ vascular) testing. Due to room size and safety concerns, children are not allowed in the ultrasound rooms during exams. Our front office staff cannot provide observation of children in our lobby area while testing is being conducted. An adult accompanying a patient to their appointment will only be allowed in the ultrasound room at the discretion of the ultrasound technician under special circumstances. We apologize for any inconvenience.   CTA AORTA  Non-Cardiac CT scanning, (CAT scanning), is a noninvasive, special x-ray that produces cross-sectional images of the body using x-rays and a computer. CT scans help physicians diagnose and treat medical conditions. For some CT exams, a contrast material is used to enhance visibility in the area of the body being studied. CT scans provide greater clarity and reveal more details than regular x-ray exams.   Follow-Up: At [facility], you and your health needs are our priority.   As part of our continuing mission to provide you with exceptional heart care, our providers are all part of one team.  This team includes your primary Cardiologist (physician) and Advanced Practice Providers or APPs (Physician Assistants and Nurse Practitioners) who all work together to provide you with the care you need, when you need it.  Your next appointment:   AS NEEDED   Provider:   Newman JINNY Lawrence, MD    We recommend signing up for the patient portal called MyChart.  Sign up information is provided on this After Visit Summary.  MyChart is used to connect with patients for Virtual Visits (Telemedicine).  Patients are able to view lab/test results, encounter notes, upcoming appointments, etc.  Non-urgent messages can be sent to your provider as well.   To learn more about what you can do with MyChart, go to [email or web address].   Other Instructions          "

## 2024-10-23 NOTE — Progress Notes (Signed)
 " Cardiology Office Note:  .   Date:  10/23/2024  ID:  Victoria [name], DOB 19-Dec-1955, MRN [id number] PCP: Katheen Roselie Rockford, NP  [facility] Providers Cardiologist:  Newman Lawrence, MD PCP: Katheen Roselie Rockford, NP  Chief Complaint  Patient presents with   Dilation of ascending aorta     Victoria [name] is a 69 y.o. female with ESRD on HD, dilation of ascending aorta, aortic atherosclerosis, coronary calcification  Discussed the use of AI scribe software for clinical note transcription with the patient, who gave verbal consent to proceed.  History of Present Illness Victoria [name] is a 69 year old female with hypertension and dialysis dependency who presents for evaluation of a possible aortic aneurysm.  She has been on hemodialysis for 31 years for hypertensive kidney failure. She currently uses a functioning graft for dialysis access without recent access issues.  A recent CT scan identified a possible aortic aneurysm near the heart. She denies chest pain, dyspnea, leg swelling, or exertional fatigue with usual activities.  She takes midodrine  twice daily for chronic low blood pressure and has no history of syncope or family history of sudden death.  She smokes cigarettes and does not drink alcohol. She lives with her mother and remains active with household tasks and shopping.      Vitals:   10/23/24 0832  BP: 100/64  Pulse: 75  SpO2: 94%      Review of Systems  Cardiovascular:  Negative for chest pain, dyspnea on exertion, leg swelling, palpitations and syncope.        Studies Reviewed: Victoria [name]        EKG 10/23/2024: Sinus rhythm with sinus arrhythmia with 1st degree A-V block Right bundle branch block Possible Lateral infarct (cited on or before 30-May-2020) When compared with ECG of 09-Aug-2024 06:08, QT has lengthened  CTA aorta 09/2024: 1. No acute findings in the abdomen or pelvis to account for pain. 2. Ascending thoracic aortic aneurysm with  visualized portion in the lower thoracic aorta measuring 4.0 cm in diameter, not completely characterized on this exam. Dedicated CT chest recommended for complete characterization. 3. Systemic atherosclerosis, including the aorta and iliac arteries, with atheromatous plaque at the origins of the celiac trunk and superior mesenteric artery, and coronary artery atherosclerosis. 4. Mild cardiomegaly and mild mitral valve calcification. 5. Mild left basilar atelectasis with borderline elevation of the left hemidiaphragm. 6. Hypodense hepatic lesions, including a 1.1 cm right hepatic lobe lesion with new faint marginal calcification compared with 03/01/2017. 7. Bilateral renal atrophy with numerous bilateral renal cysts of varying complexity, similar to 2018. 8. 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 9. Sigmoid colon diverticulosis. 10. Status post hysterectomy. 11. Right eccentric disc osteophyte complex at t12-l1 without impingement.  Labs 07/2024: Hb 13.3 Cr 7  2021: Chol 163, TG 120, HDL 47, LDL 91    Physical Exam Vitals and nursing note reviewed.  Constitutional:      General: She is not in acute distress. Neck:     Vascular: No JVD.  Cardiovascular:     Rate and Rhythm: Normal rate and regular rhythm.     Heart sounds: Normal heart sounds. No murmur heard.    Comments: Right upper extremity AV graft Left radial pulse reduced Pulmonary:     Effort: Pulmonary effort is normal.  Breath sounds: Normal breath sounds. No wheezing or rales.  Musculoskeletal:     Right lower leg: No edema.     Left lower leg: No edema.      VISIT DIAGNOSES:   ICD-10-CM   1. Aneurysm of ascending aorta without rupture  I71.21 EKG 12-Lead    2. Aortic atherosclerosis  I70.0 EKG 12-Lead       Victoria [name] is a 69 y.o.  female with ESRD on HD, dilation of ascending aorta, aortic atherosclerosis, coronary calcification Assessment & Plan Ascending aortic aneurysm without rupture: Mild aortic dilation noted on non-dedicated CT. Asymptomatic. Likely mild, may not require intervention. - Ordered dedicated CT scan with heart rate gating to assess aortic dilation. - Ordered echocardiogram to evaluate heart size.  Aortic atherosclerosis: Calcium  in heart arteries and aorta on CT. Asymptomatic. No immediate stress testing needed. - Checked cholesterol levels. - Will consider statin therapy based on cholesterol results.  End stage renal disease on dialysis Long-standing end stage renal disease due to hypertension, managed with dialysis for 31 years. Graft functioning well.  Prolonged QT interval EKG shows prolonged QT interval. Increased arrhythmia risk. Congenital prolongation unlikely. Beta blockers not recommended due to hypotension and midodrine  use. - Continue to monitor QT interval.  Reduced left radial pulse: Low blood pressure managed with midodrine . Reduced pulse in left wrist suggests possible subclavian artery narrowing affecting readings. - Continue midodrine  as prescribed. - Will evaluate for subclavian artery narrowing on CT scan.   F/u as needed  Signed, Newman Victoria Lawrence, MD  "

## 2024-10-24 ENCOUNTER — Ambulatory Visit: Payer: Self-pay | Admitting: Cardiology

## 2024-10-24 LAB — LIPID PANEL
Chol/HDL Ratio: 3.4 ratio (ref 0.0–4.4)
Cholesterol, Total: 179 mg/dL (ref 100–199)
HDL: 53 mg/dL
LDL Chol Calc (NIH): 111 mg/dL — ABNORMAL HIGH (ref 0–99)
Triglycerides: 83 mg/dL (ref 0–149)
VLDL Cholesterol Cal: 15 mg/dL (ref 5–40)

## 2024-10-24 LAB — BASIC METABOLIC PANEL WITH GFR
BUN/Creatinine Ratio: 4 — ABNORMAL LOW (ref 12–28)
BUN: 47 mg/dL — ABNORMAL HIGH (ref 8–27)
CO2: 25 mmol/L (ref 20–29)
Calcium: 7.9 mg/dL — ABNORMAL LOW (ref 8.7–10.3)
Chloride: 97 mmol/L (ref 96–106)
Creatinine, Ser: 10.59 mg/dL — ABNORMAL HIGH (ref 0.57–1.00)
Glucose: 69 mg/dL — ABNORMAL LOW (ref 70–99)
Potassium: 4.1 mmol/L (ref 3.5–5.2)
Sodium: 143 mmol/L (ref 134–144)
eGFR: 4 mL/min/{1.73_m2} — ABNORMAL LOW

## 2024-10-24 NOTE — Progress Notes (Signed)
 Unable to leave a message, will attempt again at a later time.

## 2024-10-24 NOTE — Progress Notes (Signed)
 Noted to have coronary calcification and aortic atherosclerosis.  LDL is 111, optimal goal <70.  Recommend adding Lipitor 40 mg daily, can follow-up lipid panel with PCP in future.  Thanks MJP

## 2024-10-25 ENCOUNTER — Ambulatory Visit (HOSPITAL_COMMUNITY): Admission: RE | Admit: 2024-10-25 | Discharge: 2024-10-25 | Attending: Cardiology

## 2024-10-25 DIAGNOSIS — I712 Thoracic aortic aneurysm, without rupture, unspecified: Secondary | ICD-10-CM

## 2024-10-26 ENCOUNTER — Other Ambulatory Visit (HOSPITAL_COMMUNITY): Payer: Self-pay

## 2024-10-26 LAB — ECHOCARDIOGRAM COMPLETE
Area-P 1/2: 3.37 cm2
S' Lateral: 3 cm

## 2024-10-26 MED ORDER — ATORVASTATIN CALCIUM 40 MG PO TABS
40.0000 mg | ORAL_TABLET | Freq: Every day | ORAL | 3 refills | Status: AC
  Filled 2024-10-26: qty 90, 90d supply, fill #0

## 2024-10-26 NOTE — Progress Notes (Signed)
 Low normal left heart function, mildly reduced right heart function.  Continue current medications. Mildly dilated aorta at 4 cm.  Recommend repeat echocardiogram for ascending aorta dilation in 1 year. She should also be getting CT scan of the aorta to evaluate aorta dilation as well as possibility of left subclavian stenosis.  Do know when this is scheduled for?  Thanks MJP

## 2024-11-13 ENCOUNTER — Ambulatory Visit: Admitting: Nurse Practitioner

## 2024-12-11 ENCOUNTER — Ambulatory Visit: Admitting: Physician Assistant

## 2025-03-12 ENCOUNTER — Ambulatory Visit

## 2025-10-22 ENCOUNTER — Ambulatory Visit
